# Patient Record
Sex: Female | Born: 1980 | Race: Black or African American | Hispanic: No | Marital: Single | Smoking: Current every day smoker
Health system: Southern US, Community
[De-identification: ages and names within clinical notes are randomized; demographics above are authoritative.]

## PROBLEM LIST (undated history)

## (undated) ENCOUNTER — Emergency Department: Admission: EM | Payer: Medicaid Other

## (undated) DIAGNOSIS — F319 Bipolar disorder, unspecified: Secondary | ICD-10-CM

## (undated) DIAGNOSIS — I10 Essential (primary) hypertension: Secondary | ICD-10-CM

## (undated) DIAGNOSIS — F329 Major depressive disorder, single episode, unspecified: Secondary | ICD-10-CM

## (undated) DIAGNOSIS — F79 Unspecified intellectual disabilities: Secondary | ICD-10-CM

## (undated) DIAGNOSIS — E213 Hyperparathyroidism, unspecified: Secondary | ICD-10-CM

## (undated) DIAGNOSIS — N390 Urinary tract infection, site not specified: Secondary | ICD-10-CM

## (undated) DIAGNOSIS — E119 Type 2 diabetes mellitus without complications: Secondary | ICD-10-CM

## (undated) DIAGNOSIS — N189 Chronic kidney disease, unspecified: Secondary | ICD-10-CM

## (undated) DIAGNOSIS — R4189 Other symptoms and signs involving cognitive functions and awareness: Secondary | ICD-10-CM

## (undated) DIAGNOSIS — F32A Depression, unspecified: Secondary | ICD-10-CM

## (undated) DIAGNOSIS — A4901 Methicillin susceptible Staphylococcus aureus infection, unspecified site: Secondary | ICD-10-CM

## (undated) DIAGNOSIS — D649 Anemia, unspecified: Secondary | ICD-10-CM

## (undated) DIAGNOSIS — E785 Hyperlipidemia, unspecified: Secondary | ICD-10-CM

## (undated) DIAGNOSIS — F1010 Alcohol abuse, uncomplicated: Secondary | ICD-10-CM

## (undated) HISTORY — PX: CHOLECYSTECTOMY: SHX55

## (undated) HISTORY — PX: AV FISTULA PLACEMENT: SHX1204

## (undated) HISTORY — PX: OTHER SURGICAL HISTORY: SHX169

---

## 2011-12-13 ENCOUNTER — Ambulatory Visit: Payer: Self-pay | Admitting: Vascular Surgery

## 2011-12-26 ENCOUNTER — Emergency Department: Payer: Self-pay

## 2011-12-27 ENCOUNTER — Ambulatory Visit: Payer: Self-pay | Admitting: Vascular Surgery

## 2011-12-27 DIAGNOSIS — I10 Essential (primary) hypertension: Secondary | ICD-10-CM

## 2011-12-27 LAB — BASIC METABOLIC PANEL
BUN: 22 mg/dL — ABNORMAL HIGH (ref 7–18)
Chloride: 100 mmol/L (ref 98–107)
Creatinine: 8.93 mg/dL — ABNORMAL HIGH (ref 0.60–1.30)
EGFR (African American): 7 — ABNORMAL LOW
EGFR (Non-African Amer.): 6 — ABNORMAL LOW
Glucose: 71 mg/dL (ref 65–99)
Osmolality: 276 (ref 275–301)
Potassium: 4.1 mmol/L (ref 3.5–5.1)
Sodium: 137 mmol/L (ref 136–145)

## 2011-12-27 LAB — CBC
HGB: 9.3 g/dL — ABNORMAL LOW (ref 12.0–16.0)
MCH: 31.9 pg (ref 26.0–34.0)
Platelet: 183 10*3/uL (ref 150–440)
RBC: 2.93 10*6/uL — ABNORMAL LOW (ref 3.80–5.20)
WBC: 7.7 10*3/uL (ref 3.6–11.0)

## 2012-01-02 ENCOUNTER — Emergency Department: Payer: Self-pay | Admitting: Emergency Medicine

## 2012-01-02 LAB — DRUG SCREEN, URINE

## 2012-01-02 LAB — VALPROIC ACID LEVEL: Valproic Acid: 4 ug/mL — ABNORMAL LOW

## 2012-01-02 LAB — COMPREHENSIVE METABOLIC PANEL
Alkaline Phosphatase: 218 U/L — ABNORMAL HIGH (ref 50–136)
BUN: 30 mg/dL — ABNORMAL HIGH (ref 7–18)
Chloride: 99 mmol/L (ref 98–107)
Creatinine: 11.91 mg/dL — ABNORMAL HIGH (ref 0.60–1.30)
EGFR (African American): 5 — ABNORMAL LOW
Glucose: 128 mg/dL — ABNORMAL HIGH (ref 65–99)
SGOT(AST): 45 U/L — ABNORMAL HIGH (ref 15–37)
SGPT (ALT): 48 U/L
Total Protein: 7.7 g/dL (ref 6.4–8.2)

## 2012-01-02 LAB — URINALYSIS, COMPLETE
Bilirubin,UR: NEGATIVE
Glucose,UR: 50 mg/dL (ref 0–75)
Ketone: NEGATIVE
Nitrite: NEGATIVE
Protein: 100
RBC,UR: <1 /HPF (ref 0–5)
Specific Gravity: 1.014 (ref 1.003–1.030)
WBC UR: 9 /HPF (ref 0–5)

## 2012-01-02 LAB — CBC
HCT: 28.7 % — ABNORMAL LOW (ref 35.0–47.0)
MCHC: 32.7 g/dL (ref 32.0–36.0)
Platelet: 284 10*3/uL (ref 150–440)
RBC: 2.93 10*6/uL — ABNORMAL LOW (ref 3.80–5.20)
RDW: 21.9 % — ABNORMAL HIGH (ref 11.5–14.5)
WBC: 8.4 10*3/uL (ref 3.6–11.0)

## 2012-01-02 LAB — PREGNANCY, URINE: Pregnancy Test, Urine: NEGATIVE m[IU]/mL

## 2012-01-02 LAB — ETHANOL
Ethanol %: <0.003 % (ref 0.000–0.080)
Ethanol: <3 mg/dL

## 2012-01-02 LAB — ACETAMINOPHEN LEVEL: Acetaminophen: <2 ug/mL

## 2012-01-03 LAB — BASIC METABOLIC PANEL
Anion Gap: 15 (ref 7–16)
Calcium, Total: 9.4 mg/dL (ref 8.5–10.1)
Creatinine: 14.13 mg/dL — ABNORMAL HIGH (ref 0.60–1.30)
EGFR (African American): 4 — ABNORMAL LOW
EGFR (Non-African Amer.): 3 — ABNORMAL LOW
Osmolality: 286 (ref 275–301)
Potassium: 4.8 mmol/L (ref 3.5–5.1)
Sodium: 138 mmol/L (ref 136–145)

## 2012-01-03 LAB — PHOSPHORUS: Phosphorus: 2.7 mg/dL (ref 2.5–4.9)

## 2012-01-04 LAB — CBC WITH DIFFERENTIAL/PLATELET
Basophil #: 0 10*3/uL (ref 0.0–0.1)
Basophil %: 0.6 %
HCT: 27.5 % — ABNORMAL LOW (ref 35.0–47.0)
HGB: 9 g/dL — ABNORMAL LOW (ref 12.0–16.0)
Lymphocyte #: 1.5 10*3/uL (ref 1.0–3.6)
Lymphocyte %: 25.8 %
MCV: 98 fL (ref 80–100)
Monocyte %: 5.7 %
Neutrophil #: 3.7 10*3/uL (ref 1.4–6.5)
Neutrophil %: 62.1 %
RBC: 2.83 10*6/uL — ABNORMAL LOW (ref 3.80–5.20)
RDW: 19.7 % — ABNORMAL HIGH (ref 11.5–14.5)
WBC: 5.8 10*3/uL (ref 3.6–11.0)

## 2012-01-04 LAB — RENAL FUNCTION PANEL
Albumin: 3.4 g/dL (ref 3.4–5.0)
Anion Gap: 13 (ref 7–16)
Calcium, Total: 9.7 mg/dL (ref 8.5–10.1)
Co2: 30 mmol/L (ref 21–32)
Creatinine: 16.59 mg/dL — ABNORMAL HIGH (ref 0.60–1.30)
EGFR (African American): 3 — ABNORMAL LOW
Glucose: 161 mg/dL — ABNORMAL HIGH (ref 65–99)
Phosphorus: 3.1 mg/dL (ref 2.5–4.9)
Potassium: 4.4 mmol/L (ref 3.5–5.1)
Sodium: 138 mmol/L (ref 136–145)

## 2012-01-04 LAB — BASIC METABOLIC PANEL
Anion Gap: 12 (ref 7–16)
BUN: 41 mg/dL — ABNORMAL HIGH (ref 7–18)
Calcium, Total: 9.3 mg/dL (ref 8.5–10.1)
Chloride: 95 mmol/L — ABNORMAL LOW (ref 98–107)
Co2: 31 mmol/L (ref 21–32)
Creatinine: 16.28 mg/dL — ABNORMAL HIGH (ref 0.60–1.30)
EGFR (African American): 3 — ABNORMAL LOW
EGFR (Non-African Amer.): 3 — ABNORMAL LOW
Potassium: 4.4 mmol/L (ref 3.5–5.1)
Sodium: 138 mmol/L (ref 136–145)

## 2012-01-05 LAB — BASIC METABOLIC PANEL
Anion Gap: 8 (ref 7–16)
Chloride: 99 mmol/L (ref 98–107)
Co2: 29 mmol/L (ref 21–32)
EGFR (African American): 6 — ABNORMAL LOW
EGFR (Non-African Amer.): 5 — ABNORMAL LOW
Glucose: 65 mg/dL (ref 65–99)
Potassium: 4.7 mmol/L (ref 3.5–5.1)
Sodium: 136 mmol/L (ref 136–145)

## 2012-01-08 LAB — COMPREHENSIVE METABOLIC PANEL
Alkaline Phosphatase: 210 U/L — ABNORMAL HIGH (ref 50–136)
BUN: 44 mg/dL — ABNORMAL HIGH (ref 7–18)
Bilirubin,Total: 0.3 mg/dL (ref 0.2–1.0)
Calcium, Total: 9.3 mg/dL (ref 8.5–10.1)
Chloride: 97 mmol/L — ABNORMAL LOW (ref 98–107)
Creatinine: 11.35 mg/dL — ABNORMAL HIGH (ref 0.60–1.30)
EGFR (African American): 5 — ABNORMAL LOW
SGPT (ALT): 28 U/L
Sodium: 137 mmol/L (ref 136–145)
Total Protein: 7.7 g/dL (ref 6.4–8.2)

## 2012-01-09 LAB — CBC WITH DIFFERENTIAL/PLATELET
Basophil #: 0.1 10*3/uL (ref 0.0–0.1)
Eosinophil %: 5.3 %
Lymphocyte #: 1.4 10*3/uL (ref 1.0–3.6)
Lymphocyte %: 41.3 %
MCH: 32 pg (ref 26.0–34.0)
MCHC: 32.7 g/dL (ref 32.0–36.0)
MCV: 98 fL (ref 80–100)
Monocyte #: 0.1 10*3/uL (ref 0.0–0.7)
Platelet: 232 10*3/uL (ref 150–440)
RBC: 2.67 10*6/uL — ABNORMAL LOW (ref 3.80–5.20)
RDW: 20.7 % — ABNORMAL HIGH (ref 11.5–14.5)

## 2012-01-09 LAB — PHOSPHORUS: Phosphorus: 1.6 mg/dL — ABNORMAL LOW (ref 2.5–4.9)

## 2012-01-11 LAB — CBC WITH DIFFERENTIAL/PLATELET
Basophil #: 0 10*3/uL (ref 0.0–0.1)
Eosinophil #: 0.2 10*3/uL (ref 0.0–0.7)
Lymphocyte #: 1.5 10*3/uL (ref 1.0–3.6)
MCH: 31.6 pg (ref 26.0–34.0)
MCHC: 32.1 g/dL (ref 32.0–36.0)
MCV: 98 fL (ref 80–100)
Monocyte #: 0.4 10*3/uL (ref 0.0–0.7)
Monocyte %: 8.6 %
Neutrophil #: 2.7 10*3/uL (ref 1.4–6.5)
Neutrophil %: 56.5 %
Platelet: 238 10*3/uL (ref 150–440)
RDW: 20.9 % — ABNORMAL HIGH (ref 11.5–14.5)
WBC: 4.9 10*3/uL (ref 3.6–11.0)

## 2012-01-11 LAB — RENAL FUNCTION PANEL
Albumin: 3.2 g/dL — ABNORMAL LOW (ref 3.4–5.0)
Anion Gap: 11 (ref 7–16)
BUN: 56 mg/dL — ABNORMAL HIGH (ref 7–18)
Chloride: 94 mmol/L — ABNORMAL LOW (ref 98–107)
Co2: 30 mmol/L (ref 21–32)
Creatinine: 12.71 mg/dL — ABNORMAL HIGH (ref 0.60–1.30)
EGFR (African American): 4 — ABNORMAL LOW
EGFR (Non-African Amer.): 4 — ABNORMAL LOW
Glucose: 148 mg/dL — ABNORMAL HIGH (ref 65–99)
Osmolality: 288 (ref 275–301)
Potassium: 5.7 mmol/L — ABNORMAL HIGH (ref 3.5–5.1)
Sodium: 135 mmol/L — ABNORMAL LOW (ref 136–145)

## 2012-01-14 LAB — RENAL FUNCTION PANEL
BUN: 46 mg/dL — ABNORMAL HIGH (ref 7–18)
Chloride: 94 mmol/L — ABNORMAL LOW (ref 98–107)
Creatinine: 12.76 mg/dL — ABNORMAL HIGH (ref 0.60–1.30)
EGFR (African American): 4 — ABNORMAL LOW
Glucose: 193 mg/dL — ABNORMAL HIGH (ref 65–99)
Phosphorus: 2.4 mg/dL — ABNORMAL LOW (ref 2.5–4.9)
Potassium: 5.6 mmol/L — ABNORMAL HIGH (ref 3.5–5.1)
Sodium: 134 mmol/L — ABNORMAL LOW (ref 136–145)

## 2012-01-14 LAB — CBC WITH DIFFERENTIAL/PLATELET
Basophil #: 0 x10 3/mm 3
Basophil %: 0.5 %
Eosinophil #: 0.2 x10 3/mm 3
Eosinophil %: 3.5 %
HCT: 26.8 % — ABNORMAL LOW
HGB: 8.7 g/dL — ABNORMAL LOW
Lymphocyte %: 24.3 %
Lymphs Abs: 1.4 x10 3/mm 3
MCH: 32.1 pg
MCHC: 32.3 g/dL
MCV: 99 fL
Monocyte #: 0.2 x10 3/mm 3
Monocyte %: 4.4 %
Neutrophil #: 3.8 x10 3/mm 3
Neutrophil %: 67.3 %
Platelet: 260 x10 3/mm 3
RBC: 2.7 X10 6/mm 3 — ABNORMAL LOW
RDW: 20.9 % — ABNORMAL HIGH
WBC: 5.6 x10 3/mm 3

## 2012-03-04 ENCOUNTER — Inpatient Hospital Stay: Payer: Self-pay | Admitting: Student

## 2012-03-04 LAB — CBC
HCT: 35.7 % (ref 35.0–47.0)
MCH: 32.8 pg (ref 26.0–34.0)
MCHC: 32.8 g/dL (ref 32.0–36.0)
Platelet: 138 10*3/uL — ABNORMAL LOW (ref 150–440)
RBC: 3.57 10*6/uL — ABNORMAL LOW (ref 3.80–5.20)
WBC: 9.3 10*3/uL (ref 3.6–11.0)

## 2012-03-04 LAB — COMPREHENSIVE METABOLIC PANEL
Alkaline Phosphatase: 199 U/L — ABNORMAL HIGH (ref 50–136)
Anion Gap: 11 (ref 7–16)
Bilirubin,Total: 0.3 mg/dL (ref 0.2–1.0)
Chloride: 99 mmol/L (ref 98–107)
Co2: 26 mmol/L (ref 21–32)
EGFR (African American): 9 — ABNORMAL LOW
Glucose: 310 mg/dL — ABNORMAL HIGH (ref 65–99)
Potassium: 4.6 mmol/L (ref 3.5–5.1)
SGPT (ALT): 17 U/L

## 2012-03-04 LAB — DIFFERENTIAL
Basophil #: 0 10*3/uL (ref 0.0–0.1)
Eosinophil #: 0 10*3/uL (ref 0.0–0.7)
Lymphocyte #: 0.2 10*3/uL — ABNORMAL LOW (ref 1.0–3.6)

## 2012-03-05 LAB — CK TOTAL AND CKMB (NOT AT ARMC)
CK, Total: 55 U/L (ref 21–215)
CK, Total: 60 U/L (ref 21–215)
CK-MB: 0.6 ng/mL (ref 0.5–3.6)

## 2012-03-05 LAB — URINALYSIS, COMPLETE
Blood: NEGATIVE
Glucose,UR: >=500 mg/dL (ref 0–75)
Ketone: NEGATIVE
Nitrite: NEGATIVE
Ph: 7 (ref 4.5–8.0)
Protein: 100
WBC UR: 11 /HPF (ref 0–5)

## 2012-03-05 LAB — TROPONIN I: Troponin-I: <0.02 ng/mL

## 2012-03-06 LAB — URINE CULTURE

## 2012-03-07 LAB — CBC WITH DIFFERENTIAL/PLATELET
Basophil %: 0.4 %
Eosinophil %: 5.5 %
HGB: 10.5 g/dL — ABNORMAL LOW (ref 12.0–16.0)
Lymphocyte #: 1.9 10*3/uL (ref 1.0–3.6)
Lymphocyte %: 34.9 %
MCHC: 31.8 g/dL — ABNORMAL LOW (ref 32.0–36.0)
MCV: 100 fL (ref 80–100)
Monocyte %: 7.2 %
Neutrophil #: 2.8 10*3/uL (ref 1.4–6.5)
Platelet: 169 10*3/uL (ref 150–440)
RDW: 15 % — ABNORMAL HIGH (ref 11.5–14.5)

## 2012-03-07 LAB — BASIC METABOLIC PANEL
Calcium, Total: 8.8 mg/dL (ref 8.5–10.1)
Chloride: 101 mmol/L (ref 98–107)
Co2: 21 mmol/L (ref 21–32)
EGFR (African American): 4 — ABNORMAL LOW
EGFR (Non-African Amer.): 4 — ABNORMAL LOW
Osmolality: 295 (ref 275–301)
Sodium: 135 mmol/L — ABNORMAL LOW (ref 136–145)

## 2012-03-07 LAB — VALPROIC ACID LEVEL: Valproic Acid: 32 ug/mL — ABNORMAL LOW

## 2012-03-07 LAB — VANCOMYCIN, RANDOM: Vancomycin, Random: 33 ug/mL

## 2012-03-08 LAB — CBC WITH DIFFERENTIAL/PLATELET
Eosinophil #: 0.3 10*3/uL (ref 0.0–0.7)
Eosinophil %: 4.8 %
HCT: 32 % — ABNORMAL LOW (ref 35.0–47.0)
HGB: 10.5 g/dL — ABNORMAL LOW (ref 12.0–16.0)
MCH: 32.7 pg (ref 26.0–34.0)
MCHC: 32.9 g/dL (ref 32.0–36.0)
Neutrophil #: 2.2 10*3/uL (ref 1.4–6.5)
Neutrophil %: 39.8 %
RDW: 15.6 % — ABNORMAL HIGH (ref 11.5–14.5)
WBC: 5.4 10*3/uL (ref 3.6–11.0)

## 2012-03-08 LAB — RENAL FUNCTION PANEL
Albumin: 2.8 g/dL — ABNORMAL LOW (ref 3.4–5.0)
BUN: 49 mg/dL — ABNORMAL HIGH (ref 7–18)
Calcium, Total: 8.8 mg/dL (ref 8.5–10.1)
Creatinine: 9.35 mg/dL — ABNORMAL HIGH (ref 0.60–1.30)
EGFR (African American): 6 — ABNORMAL LOW
Glucose: 72 mg/dL (ref 65–99)
Osmolality: 287 (ref 275–301)
Phosphorus: 3.7 mg/dL (ref 2.5–4.9)
Potassium: 5.2 mmol/L — ABNORMAL HIGH (ref 3.5–5.1)

## 2012-03-08 LAB — AEROBIC CULTURE

## 2012-03-10 LAB — CBC WITH DIFFERENTIAL/PLATELET
Basophil %: 0.4 %
Eosinophil %: 4.4 %
HCT: 32.5 % — ABNORMAL LOW (ref 35.0–47.0)
HGB: 10.3 g/dL — ABNORMAL LOW (ref 12.0–16.0)
Lymphocyte %: 26.7 %
MCHC: 31.7 g/dL — ABNORMAL LOW (ref 32.0–36.0)
MCV: 100 fL (ref 80–100)
Monocyte #: 0.3 x10 3/mm (ref 0.2–0.9)
Neutrophil #: 4.1 10*3/uL (ref 1.4–6.5)
Platelet: 179 10*3/uL (ref 150–440)
RBC: 3.24 10*6/uL — ABNORMAL LOW (ref 3.80–5.20)

## 2012-03-10 LAB — RENAL FUNCTION PANEL
Anion Gap: 11 (ref 7–16)
BUN: 64 mg/dL — ABNORMAL HIGH (ref 7–18)
Calcium, Total: 8.6 mg/dL (ref 8.5–10.1)
Chloride: 100 mmol/L (ref 98–107)
Co2: 27 mmol/L (ref 21–32)
EGFR (African American): 5 — ABNORMAL LOW
EGFR (Non-African Amer.): 4 — ABNORMAL LOW
Potassium: 6.5 mmol/L (ref 3.5–5.1)
Sodium: 138 mmol/L (ref 136–145)

## 2012-03-10 LAB — CULTURE, BLOOD (SINGLE)

## 2012-03-12 LAB — RENAL FUNCTION PANEL
BUN: 53 mg/dL — ABNORMAL HIGH (ref 7–18)
Chloride: 99 mmol/L (ref 98–107)
Co2: 28 mmol/L (ref 21–32)
Creatinine: 9.83 mg/dL — ABNORMAL HIGH (ref 0.60–1.30)
EGFR (African American): 6 — ABNORMAL LOW
EGFR (Non-African Amer.): 5 — ABNORMAL LOW
Glucose: 215 mg/dL — ABNORMAL HIGH (ref 65–99)
Phosphorus: 3.7 mg/dL (ref 2.5–4.9)
Potassium: 8 mmol/L (ref 3.5–5.1)

## 2012-03-12 LAB — VALPROIC ACID LEVEL: Valproic Acid: 71 ug/mL

## 2012-03-13 LAB — RENAL FUNCTION PANEL
Albumin: 2.8 g/dL — ABNORMAL LOW (ref 3.4–5.0)
Co2: 33 mmol/L — ABNORMAL HIGH (ref 21–32)
Creatinine: 8.55 mg/dL — ABNORMAL HIGH (ref 0.60–1.30)
EGFR (Non-African Amer.): 6 — ABNORMAL LOW
Glucose: 41 mg/dL — ABNORMAL LOW (ref 65–99)
Osmolality: 289 (ref 275–301)
Phosphorus: 4.4 mg/dL (ref 2.5–4.9)
Potassium: 5.4 mmol/L — ABNORMAL HIGH (ref 3.5–5.1)
Sodium: 142 mmol/L (ref 136–145)

## 2012-03-13 LAB — CULTURE, BLOOD (SINGLE)

## 2012-03-14 LAB — BASIC METABOLIC PANEL
Anion Gap: 10 (ref 7–16)
Calcium, Total: 8.9 mg/dL (ref 8.5–10.1)
Chloride: 99 mmol/L (ref 98–107)
EGFR (African American): 8 — ABNORMAL LOW
Osmolality: 281 (ref 275–301)
Potassium: 4.8 mmol/L (ref 3.5–5.1)
Sodium: 140 mmol/L (ref 136–145)

## 2012-03-14 LAB — HEMOGLOBIN: HGB: 10.6 g/dL — ABNORMAL LOW (ref 12.0–16.0)

## 2012-04-22 ENCOUNTER — Ambulatory Visit: Payer: Self-pay | Admitting: Vascular Surgery

## 2012-04-25 ENCOUNTER — Emergency Department: Payer: Self-pay | Admitting: Emergency Medicine

## 2012-04-25 LAB — COMPREHENSIVE METABOLIC PANEL
Alkaline Phosphatase: 200 U/L — ABNORMAL HIGH (ref 50–136)
BUN: 16 mg/dL (ref 7–18)
Bilirubin,Total: 0.2 mg/dL (ref 0.2–1.0)
Calcium, Total: 8.5 mg/dL (ref 8.5–10.1)
Chloride: 99 mmol/L (ref 98–107)
Co2: 25 mmol/L (ref 21–32)
Creatinine: 5.08 mg/dL — ABNORMAL HIGH (ref 0.60–1.30)
EGFR (African American): 12 — ABNORMAL LOW
EGFR (Non-African Amer.): 11 — ABNORMAL LOW
Glucose: 382 mg/dL — ABNORMAL HIGH (ref 65–99)
Potassium: 3.7 mmol/L (ref 3.5–5.1)
SGOT(AST): 18 U/L (ref 15–37)
SGPT (ALT): 10 U/L — ABNORMAL LOW
Total Protein: 7.6 g/dL (ref 6.4–8.2)

## 2012-04-25 LAB — CBC
HCT: 34.4 % — ABNORMAL LOW (ref 35.0–47.0)
MCH: 31.9 pg (ref 26.0–34.0)
MCV: 100 fL (ref 80–100)
WBC: 5.8 10*3/uL (ref 3.6–11.0)

## 2012-05-01 LAB — CULTURE, BLOOD (SINGLE)

## 2012-05-06 ENCOUNTER — Ambulatory Visit: Payer: Self-pay | Admitting: Vascular Surgery

## 2012-05-07 ENCOUNTER — Emergency Department: Payer: Self-pay | Admitting: Emergency Medicine

## 2012-05-07 LAB — CBC WITH DIFFERENTIAL/PLATELET
Basophil %: 0.4 %
Eosinophil #: 0.2 10*3/uL (ref 0.0–0.7)
Eosinophil %: 2.6 %
HCT: 34.7 % — ABNORMAL LOW (ref 35.0–47.0)
HGB: 11 g/dL — ABNORMAL LOW (ref 12.0–16.0)
Lymphocyte #: 1.5 10*3/uL (ref 1.0–3.6)
Lymphocyte %: 21.9 %
MCH: 30.9 pg (ref 26.0–34.0)
MCHC: 31.8 g/dL — ABNORMAL LOW (ref 32.0–36.0)
Monocyte %: 8.8 %
Neutrophil #: 4.7 10*3/uL (ref 1.4–6.5)
Neutrophil %: 66.3 %
Platelet: 209 10*3/uL (ref 150–440)
RBC: 3.57 10*6/uL — ABNORMAL LOW (ref 3.80–5.20)
WBC: 7 10*3/uL (ref 3.6–11.0)

## 2012-05-07 LAB — COMPREHENSIVE METABOLIC PANEL
Albumin: 3.2 g/dL — ABNORMAL LOW (ref 3.4–5.0)
Alkaline Phosphatase: 225 U/L — ABNORMAL HIGH (ref 50–136)
Anion Gap: 8 (ref 7–16)
BUN: 16 mg/dL (ref 7–18)
Bilirubin,Total: 0.2 mg/dL (ref 0.2–1.0)
Co2: 28 mmol/L (ref 21–32)
Creatinine: 5.04 mg/dL — ABNORMAL HIGH (ref 0.60–1.30)
EGFR (Non-African Amer.): 11 — ABNORMAL LOW
Glucose: 192 mg/dL — ABNORMAL HIGH (ref 65–99)
Osmolality: 280 (ref 275–301)
SGPT (ALT): 13 U/L
Total Protein: 7.4 g/dL (ref 6.4–8.2)

## 2012-05-07 LAB — TROPONIN I: Troponin-I: <0.02 ng/mL

## 2012-05-07 LAB — TSH: Thyroid Stimulating Horm: 2.95 u[IU]/mL

## 2012-05-08 LAB — TROPONIN I: Troponin-I: <0.02 ng/mL

## 2012-05-14 LAB — CULTURE, BLOOD (SINGLE)

## 2012-06-03 ENCOUNTER — Ambulatory Visit: Payer: Self-pay | Admitting: Vascular Surgery

## 2012-07-15 ENCOUNTER — Ambulatory Visit: Payer: Self-pay | Admitting: Vascular Surgery

## 2012-07-15 LAB — POTASSIUM: Potassium: 4.3 mmol/L (ref 3.5–5.1)

## 2012-07-15 LAB — HCG, QUANTITATIVE, PREGNANCY: Beta Hcg, Quant.: 1 m[IU]/mL

## 2012-10-06 ENCOUNTER — Inpatient Hospital Stay: Payer: Self-pay | Admitting: Internal Medicine

## 2012-10-06 LAB — COMPREHENSIVE METABOLIC PANEL
Albumin: 3.5 g/dL (ref 3.4–5.0)
Alkaline Phosphatase: 176 U/L — ABNORMAL HIGH (ref 50–136)
Anion Gap: 13 (ref 7–16)
BUN: 62 mg/dL — ABNORMAL HIGH (ref 7–18)
Calcium, Total: 9 mg/dL (ref 8.5–10.1)
Co2: 24 mmol/L (ref 21–32)
Glucose: 188 mg/dL — ABNORMAL HIGH (ref 65–99)
Potassium: 4.6 mmol/L (ref 3.5–5.1)
SGOT(AST): 21 U/L (ref 15–37)
SGPT (ALT): 9 U/L — ABNORMAL LOW (ref 12–78)
Total Protein: 7.8 g/dL (ref 6.4–8.2)

## 2012-10-06 LAB — CK TOTAL AND CKMB (NOT AT ARMC)
CK, Total: 80 U/L
CK-MB: 0.7 ng/mL

## 2012-10-06 LAB — CBC
HCT: 44.6 %
HGB: 14.7 g/dL
MCH: 30.9 pg
MCHC: 33 g/dL
MCV: 94 fL
Platelet: 134 10*3/uL — ABNORMAL LOW
RBC: 4.77 X10 6/mm 3
RDW: 18.2 % — ABNORMAL HIGH
WBC: 8.7 10*3/uL

## 2012-10-06 LAB — PHOSPHORUS: Phosphorus: 4.2 mg/dL

## 2012-10-06 LAB — LIPASE, BLOOD: Lipase: 123 U/L

## 2012-10-06 LAB — VALPROIC ACID LEVEL: Valproic Acid: 48 ug/mL — ABNORMAL LOW

## 2012-10-06 LAB — HCG, QUANTITATIVE, PREGNANCY: Beta Hcg, Quant.: <1 m[IU]/mL — ABNORMAL LOW

## 2012-10-07 LAB — CBC WITH DIFFERENTIAL/PLATELET
Eosinophil %: 0.5 %
HCT: 34.2 % — ABNORMAL LOW (ref 35.0–47.0)
Lymphocyte #: 1.9 10*3/uL (ref 1.0–3.6)
MCV: 94 fL (ref 80–100)
Monocyte %: 7 %
Neutrophil %: 65.2 %
Platelet: 100 10*3/uL — ABNORMAL LOW (ref 150–440)
RBC: 3.64 10*6/uL — ABNORMAL LOW (ref 3.80–5.20)
RDW: 18.3 % — ABNORMAL HIGH (ref 11.5–14.5)
WBC: 7 10*3/uL (ref 3.6–11.0)

## 2012-10-07 LAB — HEMOGLOBIN A1C: Hemoglobin A1C: 5.7 % (ref 4.2–6.3)

## 2012-10-07 LAB — BASIC METABOLIC PANEL
Anion Gap: 14 (ref 7–16)
Chloride: 101 mmol/L (ref 98–107)
Creatinine: 12.66 mg/dL — ABNORMAL HIGH (ref 0.60–1.30)
EGFR (African American): 4 — ABNORMAL LOW
EGFR (Non-African Amer.): 4 — ABNORMAL LOW
Osmolality: 312 (ref 275–301)
Potassium: 4.6 mmol/L (ref 3.5–5.1)
Sodium: 138 mmol/L (ref 136–145)

## 2012-10-07 LAB — PHOSPHORUS: Phosphorus: 4.5 mg/dL (ref 2.5–4.9)

## 2012-10-07 LAB — MAGNESIUM: Magnesium: 2.5 mg/dL — ABNORMAL HIGH

## 2012-10-09 LAB — PHOSPHORUS: Phosphorus: 3.7 mg/dL (ref 2.5–4.9)

## 2012-10-14 LAB — CULTURE, BLOOD (SINGLE)

## 2012-11-04 ENCOUNTER — Ambulatory Visit: Payer: Self-pay | Admitting: Vascular Surgery

## 2012-11-04 LAB — CBC
HCT: 36.3 % (ref 35.0–47.0)
HGB: 12 g/dL (ref 12.0–16.0)
MCH: 31.9 pg (ref 26.0–34.0)
MCV: 96 fL (ref 80–100)
Platelet: 190 10*3/uL (ref 150–440)
RBC: 3.77 10*6/uL — ABNORMAL LOW (ref 3.80–5.20)
WBC: 6.8 10*3/uL (ref 3.6–11.0)

## 2012-11-04 LAB — BASIC METABOLIC PANEL
BUN: 27 mg/dL — ABNORMAL HIGH (ref 7–18)
Creatinine: 7.08 mg/dL — ABNORMAL HIGH (ref 0.60–1.30)
EGFR (African American): 8 — ABNORMAL LOW
EGFR (Non-African Amer.): 7 — ABNORMAL LOW
Glucose: 145 mg/dL — ABNORMAL HIGH (ref 65–99)
Osmolality: 282 (ref 275–301)
Sodium: 137 mmol/L (ref 136–145)

## 2012-11-12 ENCOUNTER — Ambulatory Visit: Payer: Self-pay | Admitting: Vascular Surgery

## 2012-11-12 LAB — POTASSIUM: Potassium: 4.6 mmol/L (ref 3.5–5.1)

## 2012-12-23 ENCOUNTER — Ambulatory Visit: Payer: Self-pay | Admitting: Vascular Surgery

## 2012-12-25 ENCOUNTER — Emergency Department: Payer: Self-pay | Admitting: Emergency Medicine

## 2012-12-25 LAB — COMPREHENSIVE METABOLIC PANEL
Albumin: 3.4 g/dL (ref 3.4–5.0)
Chloride: 99 mmol/L (ref 98–107)
Co2: 27 mmol/L (ref 21–32)
Creatinine: 8.19 mg/dL — ABNORMAL HIGH (ref 0.60–1.30)
EGFR (African American): 7 — ABNORMAL LOW
EGFR (Non-African Amer.): 6 — ABNORMAL LOW
Potassium: 5.3 mmol/L — ABNORMAL HIGH (ref 3.5–5.1)
SGOT(AST): 30 U/L (ref 15–37)
Sodium: 135 mmol/L — ABNORMAL LOW (ref 136–145)

## 2012-12-25 LAB — HCG, QUANTITATIVE, PREGNANCY: Beta Hcg, Quant.: 2 m[IU]/mL

## 2012-12-25 LAB — URINALYSIS, COMPLETE
Bacteria: NONE SEEN
Bilirubin,UR: NEGATIVE
Glucose,UR: >=500 mg/dL (ref 0–75)
Hyaline Cast: 5
Ketone: NEGATIVE
Leukocyte Esterase: NEGATIVE
Nitrite: NEGATIVE
RBC,UR: 14 /HPF (ref 0–5)
Specific Gravity: 1.018 (ref 1.003–1.030)
Squamous Epithelial: <1
WBC UR: 5 /HPF (ref 0–5)

## 2012-12-25 LAB — RAPID INFLUENZA A&B ANTIGENS

## 2012-12-25 LAB — CBC
MCHC: 33 g/dL (ref 32.0–36.0)
MCV: 98 fL (ref 80–100)
Platelet: 111 10*3/uL — ABNORMAL LOW (ref 150–440)
RBC: 3.4 10*6/uL — ABNORMAL LOW (ref 3.80–5.20)
WBC: 8.3 10*3/uL (ref 3.6–11.0)

## 2012-12-26 ENCOUNTER — Inpatient Hospital Stay: Payer: Self-pay | Admitting: Internal Medicine

## 2012-12-26 LAB — COMPREHENSIVE METABOLIC PANEL
Albumin: 3.4 g/dL (ref 3.4–5.0)
Anion Gap: 8 (ref 7–16)
Calcium, Total: 9.1 mg/dL (ref 8.5–10.1)
Chloride: 99 mmol/L (ref 98–107)
Creatinine: 11.1 mg/dL — ABNORMAL HIGH (ref 0.60–1.30)
EGFR (African American): 5 — ABNORMAL LOW
EGFR (Non-African Amer.): 4 — ABNORMAL LOW
Glucose: 248 mg/dL — ABNORMAL HIGH (ref 65–99)
Potassium: 6.1 mmol/L — ABNORMAL HIGH (ref 3.5–5.1)
SGOT(AST): 87 U/L — ABNORMAL HIGH (ref 15–37)

## 2012-12-26 LAB — MAGNESIUM: Magnesium: 2.1 mg/dL

## 2012-12-26 LAB — CBC
HCT: 30.9 % — ABNORMAL LOW (ref 35.0–47.0)
HGB: 10.2 g/dL — ABNORMAL LOW (ref 12.0–16.0)
MCHC: 33 g/dL (ref 32.0–36.0)
MCV: 98 fL (ref 80–100)

## 2012-12-26 LAB — PHOSPHORUS: Phosphorus: 3.2 mg/dL (ref 2.5–4.9)

## 2012-12-27 LAB — CBC WITH DIFFERENTIAL/PLATELET
Basophil #: 0.1 10*3/uL (ref 0.0–0.1)
Basophil %: 0.8 %
Eosinophil #: 0.2 10*3/uL (ref 0.0–0.7)
Eosinophil %: 2.7 %
HCT: 31.2 % — ABNORMAL LOW (ref 35.0–47.0)
HGB: 10.3 g/dL — ABNORMAL LOW (ref 12.0–16.0)
Lymphocyte %: 28.6 %
Monocyte #: 0.7 x10 3/mm (ref 0.2–0.9)
Neutrophil #: 5.4 10*3/uL (ref 1.4–6.5)
RBC: 3.18 10*6/uL — ABNORMAL LOW (ref 3.80–5.20)

## 2012-12-27 LAB — BASIC METABOLIC PANEL
Calcium, Total: 8.9 mg/dL (ref 8.5–10.1)
Creatinine: 7.99 mg/dL — ABNORMAL HIGH (ref 0.60–1.30)
EGFR (Non-African Amer.): 6 — ABNORMAL LOW
Potassium: 4.8 mmol/L (ref 3.5–5.1)

## 2012-12-28 LAB — CULTURE, BLOOD (SINGLE)

## 2012-12-29 LAB — VANCOMYCIN, TROUGH: Vancomycin, Trough: 17 ug/mL (ref 10–20)

## 2012-12-30 DIAGNOSIS — I059 Rheumatic mitral valve disease, unspecified: Secondary | ICD-10-CM

## 2012-12-31 LAB — PHOSPHORUS: Phosphorus: 2.8 mg/dL (ref 2.5–4.9)

## 2013-01-04 LAB — CULTURE, BLOOD (SINGLE)

## 2013-03-02 ENCOUNTER — Emergency Department: Payer: Self-pay | Admitting: Emergency Medicine

## 2013-04-02 ENCOUNTER — Ambulatory Visit: Payer: Self-pay | Admitting: Vascular Surgery

## 2013-04-09 ENCOUNTER — Inpatient Hospital Stay: Payer: Self-pay | Admitting: Internal Medicine

## 2013-04-09 LAB — CBC WITH DIFFERENTIAL/PLATELET
Basophil #: 0.1 10*3/uL (ref 0.0–0.1)
Eosinophil #: 0.1 10*3/uL (ref 0.0–0.7)
HGB: 8.1 g/dL — ABNORMAL LOW (ref 12.0–16.0)
Lymphocyte %: 19.3 %
MCH: 32 pg (ref 26.0–34.0)
Monocyte #: 0.5 x10 3/mm (ref 0.2–0.9)
Monocyte %: 4.9 %
Neutrophil #: 8.2 10*3/uL — ABNORMAL HIGH (ref 1.4–6.5)
Neutrophil %: 73.8 %
RDW: 15.2 % — ABNORMAL HIGH (ref 11.5–14.5)

## 2013-04-09 LAB — COMPREHENSIVE METABOLIC PANEL
Albumin: 2.9 g/dL — ABNORMAL LOW (ref 3.4–5.0)
BUN: 18 mg/dL (ref 7–18)
Bilirubin,Total: 0.4 mg/dL (ref 0.2–1.0)
Co2: 32 mmol/L (ref 21–32)
Creatinine: 6.97 mg/dL — ABNORMAL HIGH (ref 0.60–1.30)
EGFR (African American): 8 — ABNORMAL LOW
EGFR (Non-African Amer.): 7 — ABNORMAL LOW
Glucose: 128 mg/dL — ABNORMAL HIGH (ref 65–99)
SGOT(AST): 12 U/L — ABNORMAL LOW (ref 15–37)
Sodium: 139 mmol/L (ref 136–145)
Total Protein: 6.7 g/dL (ref 6.4–8.2)

## 2013-04-10 LAB — CBC WITH DIFFERENTIAL/PLATELET
Basophil #: 0 10*3/uL (ref 0.0–0.1)
Basophil %: 0.4 %
Eosinophil %: 3.4 %
HCT: 20.8 % — ABNORMAL LOW (ref 35.0–47.0)
Lymphocyte #: 1.3 10*3/uL (ref 1.0–3.6)
Lymphocyte %: 19 %
MCH: 32.3 pg (ref 26.0–34.0)
MCV: 96 fL (ref 80–100)
Monocyte #: 0.4 x10 3/mm (ref 0.2–0.9)
Monocyte %: 6.3 %
Neutrophil %: 70.9 %
Platelet: 169 10*3/uL (ref 150–440)
RDW: 15.5 % — ABNORMAL HIGH (ref 11.5–14.5)
WBC: 6.8 10*3/uL (ref 3.6–11.0)

## 2013-04-10 LAB — COMPREHENSIVE METABOLIC PANEL
Anion Gap: 6 — ABNORMAL LOW (ref 7–16)
BUN: 24 mg/dL — ABNORMAL HIGH (ref 7–18)
Calcium, Total: 8.6 mg/dL (ref 8.5–10.1)
Co2: 30 mmol/L (ref 21–32)
Creatinine: 8.78 mg/dL — ABNORMAL HIGH (ref 0.60–1.30)
EGFR (African American): 6 — ABNORMAL LOW
EGFR (Non-African Amer.): 5 — ABNORMAL LOW
Potassium: 4.7 mmol/L (ref 3.5–5.1)

## 2013-04-11 LAB — CBC WITH DIFFERENTIAL/PLATELET
Basophil %: 0.6 %
Eosinophil %: 4.1 %
Monocyte #: 0.3 x10 3/mm (ref 0.2–0.9)
Monocyte %: 5.4 %
Neutrophil #: 2.8 10*3/uL (ref 1.4–6.5)

## 2013-04-15 LAB — CULTURE, BLOOD (SINGLE)

## 2013-06-15 ENCOUNTER — Emergency Department: Payer: Self-pay | Admitting: Emergency Medicine

## 2013-10-29 ENCOUNTER — Emergency Department: Payer: Self-pay | Admitting: Emergency Medicine

## 2013-10-29 LAB — LIPASE, BLOOD: Lipase: 185 U/L (ref 73–393)

## 2013-10-29 LAB — COMPREHENSIVE METABOLIC PANEL
Albumin: 3 g/dL — ABNORMAL LOW (ref 3.4–5.0)
Anion Gap: 7 (ref 7–16)
BUN: 78 mg/dL — ABNORMAL HIGH (ref 7–18)
Bilirubin,Total: 0.3 mg/dL (ref 0.2–1.0)
Creatinine: 10.83 mg/dL — ABNORMAL HIGH (ref 0.60–1.30)
EGFR (African American): 5 — ABNORMAL LOW
Osmolality: 297 (ref 275–301)
Potassium: 6.2 mmol/L — ABNORMAL HIGH (ref 3.5–5.1)
SGOT(AST): 26 U/L (ref 15–37)
SGPT (ALT): 14 U/L (ref 12–78)
Sodium: 135 mmol/L — ABNORMAL LOW (ref 136–145)
Total Protein: 6.3 g/dL — ABNORMAL LOW (ref 6.4–8.2)

## 2013-10-29 LAB — CBC
HGB: 6.8 g/dL — ABNORMAL LOW (ref 12.0–16.0)
MCH: 32.3 pg (ref 26.0–34.0)
MCHC: 33 g/dL (ref 32.0–36.0)
MCV: 98 fL (ref 80–100)
RBC: 2.12 10*6/uL — ABNORMAL LOW (ref 3.80–5.20)

## 2013-10-29 LAB — PHOSPHORUS: Phosphorus: 4.6 mg/dL (ref 2.5–4.9)

## 2013-12-14 ENCOUNTER — Other Ambulatory Visit: Payer: Self-pay | Admitting: Nephrology

## 2013-12-14 LAB — POTASSIUM: Potassium: 3.6 mmol/L (ref 3.5–5.1)

## 2014-05-22 ENCOUNTER — Emergency Department: Payer: Self-pay | Admitting: Emergency Medicine

## 2014-06-02 ENCOUNTER — Emergency Department: Payer: Self-pay | Admitting: Emergency Medicine

## 2014-06-02 LAB — CBC
HCT: 39.6 % (ref 35.0–47.0)
HGB: 13.4 g/dL (ref 12.0–16.0)
MCH: 32.6 pg (ref 26.0–34.0)
MCHC: 33.7 g/dL (ref 32.0–36.0)
MCV: 97 fL (ref 80–100)
Platelet: 151 10*3/uL (ref 150–440)
RBC: 4.09 10*6/uL (ref 3.80–5.20)
RDW: 14.4 % (ref 11.5–14.5)
WBC: 10.3 10*3/uL (ref 3.6–11.0)

## 2014-06-03 ENCOUNTER — Inpatient Hospital Stay: Payer: Self-pay | Admitting: Specialist

## 2014-06-03 LAB — CBC WITH DIFFERENTIAL/PLATELET
BASOS PCT: 0.3 %
Basophil #: 0 10*3/uL (ref 0.0–0.1)
EOS PCT: 0 %
Eosinophil #: 0 10*3/uL (ref 0.0–0.7)
HCT: 37.8 % (ref 35.0–47.0)
HGB: 12.5 g/dL (ref 12.0–16.0)
Lymphocyte #: 0.5 10*3/uL — ABNORMAL LOW (ref 1.0–3.6)
Lymphocyte %: 3.8 %
MCH: 31.8 pg (ref 26.0–34.0)
MCHC: 33 g/dL (ref 32.0–36.0)
MCV: 97 fL (ref 80–100)
Monocyte #: 0.3 x10 3/mm (ref 0.2–0.9)
Monocyte %: 2.7 %
Neutrophil #: 11.7 10*3/uL — ABNORMAL HIGH (ref 1.4–6.5)
Neutrophil %: 93.2 %
PLATELETS: 112 10*3/uL — AB (ref 150–440)
RBC: 3.92 10*6/uL (ref 3.80–5.20)
RDW: 14.4 % (ref 11.5–14.5)
WBC: 12.5 10*3/uL — ABNORMAL HIGH (ref 3.6–11.0)

## 2014-06-03 LAB — URINALYSIS, COMPLETE
Bacteria: NEGATIVE
Bilirubin,UR: NEGATIVE
Ketone: NEGATIVE
Leukocyte Esterase: NEGATIVE
NITRITE: NEGATIVE
PH: 9 (ref 4.5–8.0)
Protein: 500
SPECIFIC GRAVITY: 1.005 (ref 1.003–1.030)

## 2014-06-03 LAB — COMPREHENSIVE METABOLIC PANEL
Albumin: 3.4 g/dL (ref 3.4–5.0)
Alkaline Phosphatase: 131 U/L — ABNORMAL HIGH
Anion Gap: 15 (ref 7–16)
BUN: 83 mg/dL — ABNORMAL HIGH (ref 7–18)
Bilirubin,Total: 0.5 mg/dL (ref 0.2–1.0)
CREATININE: 14.91 mg/dL — AB (ref 0.60–1.30)
Calcium, Total: 9.7 mg/dL (ref 8.5–10.1)
Chloride: 89 mmol/L — ABNORMAL LOW (ref 98–107)
Co2: 24 mmol/L (ref 21–32)
EGFR (African American): 3 — ABNORMAL LOW
EGFR (Non-African Amer.): 3 — ABNORMAL LOW
GLUCOSE: 297 mg/dL — AB (ref 65–99)
Osmolality: 293 (ref 275–301)
Potassium: 4.9 mmol/L (ref 3.5–5.1)
SGOT(AST): 129 U/L — ABNORMAL HIGH (ref 15–37)
SGPT (ALT): 162 U/L — ABNORMAL HIGH (ref 12–78)
Sodium: 128 mmol/L — ABNORMAL LOW (ref 136–145)
Total Protein: 8.1 g/dL (ref 6.4–8.2)

## 2014-06-03 LAB — CBC
HCT: 41.1 % (ref 35.0–47.0)
HGB: 13.3 g/dL (ref 12.0–16.0)
MCH: 31 pg (ref 26.0–34.0)
MCHC: 32.4 g/dL (ref 32.0–36.0)
MCV: 96 fL (ref 80–100)
Platelet: 130 10*3/uL — ABNORMAL LOW (ref 150–440)
RBC: 4.29 10*6/uL (ref 3.80–5.20)
RDW: 14.6 % — ABNORMAL HIGH (ref 11.5–14.5)
WBC: 14.2 10*3/uL — AB (ref 3.6–11.0)

## 2014-06-03 LAB — BASIC METABOLIC PANEL
Anion Gap: 14 (ref 7–16)
BUN: 82 mg/dL — AB (ref 7–18)
CHLORIDE: 88 mmol/L — AB (ref 98–107)
CO2: 26 mmol/L (ref 21–32)
Calcium, Total: 10.1 mg/dL (ref 8.5–10.1)
Creatinine: 14.33 mg/dL — ABNORMAL HIGH (ref 0.60–1.30)
EGFR (Non-African Amer.): 3 — ABNORMAL LOW
GFR CALC AF AMER: 3 — AB
Glucose: 319 mg/dL — ABNORMAL HIGH (ref 65–99)
Osmolality: 294 (ref 275–301)
Potassium: 5.5 mmol/L — ABNORMAL HIGH (ref 3.5–5.1)
Sodium: 128 mmol/L — ABNORMAL LOW (ref 136–145)

## 2014-06-03 LAB — TROPONIN I: Troponin-I: <0.02 ng/mL

## 2014-06-03 LAB — LIPASE, BLOOD: Lipase: 372 U/L (ref 73–393)

## 2014-06-05 LAB — PLATELET COUNT: PLATELETS: 95 10*3/uL — AB (ref 150–440)

## 2014-06-05 LAB — PHOSPHORUS: Phosphorus: 3.8 mg/dL (ref 2.5–4.9)

## 2014-06-07 LAB — RENAL FUNCTION PANEL
ANION GAP: 12 (ref 7–16)
Albumin: 2.6 g/dL — ABNORMAL LOW (ref 3.4–5.0)
BUN: 38 mg/dL — ABNORMAL HIGH (ref 7–18)
CALCIUM: 9 mg/dL (ref 8.5–10.1)
Chloride: 92 mmol/L — ABNORMAL LOW (ref 98–107)
Co2: 28 mmol/L (ref 21–32)
Creatinine: 11.49 mg/dL — ABNORMAL HIGH (ref 0.60–1.30)
EGFR (African American): 5 — ABNORMAL LOW
EGFR (Non-African Amer.): 4 — ABNORMAL LOW
Glucose: 382 mg/dL — ABNORMAL HIGH (ref 65–99)
Osmolality: 289 (ref 275–301)
POTASSIUM: 4.1 mmol/L (ref 3.5–5.1)
Phosphorus: 4.2 mg/dL (ref 2.5–4.9)
SODIUM: 132 mmol/L — AB (ref 136–145)

## 2014-06-07 LAB — CBC WITH DIFFERENTIAL/PLATELET
Basophil #: 0 10*3/uL (ref 0.0–0.1)
Basophil %: 0.6 %
Eosinophil #: 0.3 10*3/uL (ref 0.0–0.7)
Eosinophil %: 4.5 %
HCT: 31.3 % — AB (ref 35.0–47.0)
HGB: 10.2 g/dL — ABNORMAL LOW (ref 12.0–16.0)
Lymphocyte #: 1.2 10*3/uL (ref 1.0–3.6)
Lymphocyte %: 19.8 %
MCH: 31.5 pg (ref 26.0–34.0)
MCHC: 32.6 g/dL (ref 32.0–36.0)
MCV: 97 fL (ref 80–100)
Monocyte #: 0.4 x10 3/mm (ref 0.2–0.9)
Monocyte %: 7.1 %
NEUTROS ABS: 4 10*3/uL (ref 1.4–6.5)
Neutrophil %: 68 %
PLATELETS: 102 10*3/uL — AB (ref 150–440)
RBC: 3.23 10*6/uL — ABNORMAL LOW (ref 3.80–5.20)
RDW: 14.4 % (ref 11.5–14.5)
WBC: 5.8 10*3/uL (ref 3.6–11.0)

## 2014-06-08 LAB — CBC WITH DIFFERENTIAL/PLATELET
BASOS ABS: 0 10*3/uL (ref 0.0–0.1)
Basophil %: 0.5 %
Eosinophil #: 0.3 10*3/uL (ref 0.0–0.7)
Eosinophil %: 3.9 %
HCT: 28.6 % — ABNORMAL LOW (ref 35.0–47.0)
HGB: 9.4 g/dL — ABNORMAL LOW (ref 12.0–16.0)
LYMPHS ABS: 1.5 10*3/uL (ref 1.0–3.6)
Lymphocyte %: 20.6 %
MCH: 31.7 pg (ref 26.0–34.0)
MCHC: 32.9 g/dL (ref 32.0–36.0)
MCV: 96 fL (ref 80–100)
Monocyte #: 0.5 x10 3/mm (ref 0.2–0.9)
Monocyte %: 6.9 %
Neutrophil #: 5 10*3/uL (ref 1.4–6.5)
Neutrophil %: 68.1 %
Platelet: 116 10*3/uL — ABNORMAL LOW (ref 150–440)
RBC: 2.97 10*6/uL — ABNORMAL LOW (ref 3.80–5.20)
RDW: 14.7 % — ABNORMAL HIGH (ref 11.5–14.5)
WBC: 7.3 10*3/uL (ref 3.6–11.0)

## 2014-06-08 LAB — CULTURE, BLOOD (SINGLE)

## 2014-06-09 LAB — PHOSPHORUS: PHOSPHORUS: 5.2 mg/dL — AB (ref 2.5–4.9)

## 2014-06-11 LAB — RENAL FUNCTION PANEL
ANION GAP: 8 (ref 7–16)
Albumin: 2.4 g/dL — ABNORMAL LOW (ref 3.4–5.0)
BUN: 27 mg/dL — ABNORMAL HIGH (ref 7–18)
CHLORIDE: 94 mmol/L — AB (ref 98–107)
CO2: 29 mmol/L (ref 21–32)
Calcium, Total: 9.1 mg/dL (ref 8.5–10.1)
Creatinine: 9.9 mg/dL — ABNORMAL HIGH (ref 0.60–1.30)
EGFR (Non-African Amer.): 5 — ABNORMAL LOW
GFR CALC AF AMER: 5 — AB
Glucose: 302 mg/dL — ABNORMAL HIGH (ref 65–99)
OSMOLALITY: 279 (ref 275–301)
PHOSPHORUS: 5.4 mg/dL — AB (ref 2.5–4.9)
POTASSIUM: 4.4 mmol/L (ref 3.5–5.1)
Sodium: 131 mmol/L — ABNORMAL LOW (ref 136–145)

## 2014-06-12 LAB — CULTURE, BLOOD (SINGLE)

## 2014-06-12 LAB — PLATELET COUNT: Platelet: 206 10*3/uL (ref 150–440)

## 2014-06-14 LAB — RENAL FUNCTION PANEL
Albumin: 2.3 g/dL — ABNORMAL LOW (ref 3.4–5.0)
Anion Gap: 10 (ref 7–16)
BUN: 34 mg/dL — ABNORMAL HIGH (ref 7–18)
CO2: 28 mmol/L (ref 21–32)
CREATININE: 10.79 mg/dL — AB (ref 0.60–1.30)
Calcium, Total: 8.6 mg/dL (ref 8.5–10.1)
Chloride: 96 mmol/L — ABNORMAL LOW (ref 98–107)
GFR CALC AF AMER: 5 — AB
GFR CALC NON AF AMER: 4 — AB
Glucose: 145 mg/dL — ABNORMAL HIGH (ref 65–99)
Osmolality: 278 (ref 275–301)
PHOSPHORUS: 5.9 mg/dL — AB (ref 2.5–4.9)
Potassium: 4.9 mmol/L (ref 3.5–5.1)
Sodium: 134 mmol/L — ABNORMAL LOW (ref 136–145)

## 2014-06-16 LAB — PHOSPHORUS: Phosphorus: 5.8 mg/dL — ABNORMAL HIGH (ref 2.5–4.9)

## 2014-06-16 LAB — PLATELET COUNT: Platelet: 207 10*3/uL (ref 150–440)

## 2014-06-21 LAB — CBC
HCT: 24.6 % — AB (ref 35.0–47.0)
HGB: 7.8 g/dL — AB (ref 12.0–16.0)
MCH: 31.2 pg (ref 26.0–34.0)
MCHC: 31.8 g/dL — AB (ref 32.0–36.0)
MCV: 98 fL (ref 80–100)
PLATELETS: 194 10*3/uL (ref 150–440)
RBC: 2.51 10*6/uL — ABNORMAL LOW (ref 3.80–5.20)
RDW: 16.2 % — ABNORMAL HIGH (ref 11.5–14.5)
WBC: 6.6 10*3/uL (ref 3.6–11.0)

## 2014-06-21 LAB — COMPREHENSIVE METABOLIC PANEL
ALK PHOS: 135 U/L — AB
ANION GAP: 9 (ref 7–16)
Albumin: 2.9 g/dL — ABNORMAL LOW (ref 3.4–5.0)
BUN: 68 mg/dL — AB (ref 7–18)
Bilirubin,Total: 0.4 mg/dL (ref 0.2–1.0)
CHLORIDE: 88 mmol/L — AB (ref 98–107)
CO2: 29 mmol/L (ref 21–32)
Calcium, Total: 8.9 mg/dL (ref 8.5–10.1)
Creatinine: 14.9 mg/dL — ABNORMAL HIGH (ref 0.60–1.30)
EGFR (African American): 3 — ABNORMAL LOW
EGFR (Non-African Amer.): 3 — ABNORMAL LOW
Glucose: 319 mg/dL — ABNORMAL HIGH (ref 65–99)
Osmolality: 285 (ref 275–301)
Potassium: 7.5 mmol/L (ref 3.5–5.1)
SGOT(AST): 17 U/L (ref 15–37)
Sodium: 126 mmol/L — ABNORMAL LOW (ref 136–145)
Total Protein: 8.4 g/dL — ABNORMAL HIGH (ref 6.4–8.2)

## 2014-06-22 ENCOUNTER — Inpatient Hospital Stay: Payer: Self-pay | Admitting: Internal Medicine

## 2014-06-22 LAB — POTASSIUM
POTASSIUM: 6.7 mmol/L — AB (ref 3.5–5.1)
Potassium: 3.8 mmol/L (ref 3.5–5.1)

## 2014-06-23 LAB — BASIC METABOLIC PANEL
Anion Gap: 7 (ref 7–16)
Anion Gap: 9 (ref 7–16)
BUN: 19 mg/dL — AB (ref 7–18)
BUN: 27 mg/dL — ABNORMAL HIGH (ref 7–18)
CHLORIDE: 97 mmol/L — AB (ref 98–107)
CREATININE: 8.18 mg/dL — AB (ref 0.60–1.30)
Calcium, Total: 8.3 mg/dL — ABNORMAL LOW (ref 8.5–10.1)
Calcium, Total: 8.9 mg/dL (ref 8.5–10.1)
Chloride: 92 mmol/L — ABNORMAL LOW (ref 98–107)
Co2: 33 mmol/L — ABNORMAL HIGH (ref 21–32)
Co2: 33 mmol/L — ABNORMAL HIGH (ref 21–32)
Creatinine: 6.27 mg/dL — ABNORMAL HIGH (ref 0.60–1.30)
EGFR (African American): 9 — ABNORMAL LOW
EGFR (Non-African Amer.): 8 — ABNORMAL LOW
GFR CALC AF AMER: 7 — AB
GFR CALC NON AF AMER: 6 — AB
Glucose: 232 mg/dL — ABNORMAL HIGH (ref 65–99)
Glucose: 216 mg/dL — ABNORMAL HIGH (ref 65–99)
Osmolality: 283 (ref 275–301)
Osmolality: 280 (ref 275–301)
Potassium: 5 mmol/L (ref 3.5–5.1)
Potassium: 4.6 mmol/L (ref 3.5–5.1)
SODIUM: 137 mmol/L (ref 136–145)
SODIUM: 134 mmol/L — AB (ref 136–145)

## 2014-06-23 LAB — CBC WITH DIFFERENTIAL/PLATELET
BASOS ABS: 0.1 10*3/uL (ref 0.0–0.1)
Basophil %: 1.8 %
Eosinophil #: 0.2 10*3/uL (ref 0.0–0.7)
Eosinophil %: 4.3 %
HCT: 21.2 % — ABNORMAL LOW (ref 35.0–47.0)
HGB: 7 g/dL — ABNORMAL LOW (ref 12.0–16.0)
LYMPHS PCT: 44.5 %
Lymphocyte #: 1.8 10*3/uL (ref 1.0–3.6)
MCH: 32.1 pg (ref 26.0–34.0)
MCHC: 32.9 g/dL (ref 32.0–36.0)
MCV: 98 fL (ref 80–100)
MONO ABS: 0.4 x10 3/mm (ref 0.2–0.9)
Monocyte %: 9.8 %
NEUTROS ABS: 1.6 10*3/uL (ref 1.4–6.5)
Neutrophil %: 39.6 %
Platelet: 158 10*3/uL (ref 150–440)
RBC: 2.17 10*6/uL — ABNORMAL LOW (ref 3.80–5.20)
RDW: 16.4 % — AB (ref 11.5–14.5)
WBC: 4 10*3/uL (ref 3.6–11.0)

## 2014-06-23 LAB — MAGNESIUM: Magnesium: 2 mg/dL

## 2014-06-25 LAB — RENAL FUNCTION PANEL
ANION GAP: 6 — AB (ref 7–16)
Albumin: 2.5 g/dL — ABNORMAL LOW (ref 3.4–5.0)
BUN: 35 mg/dL — ABNORMAL HIGH (ref 7–18)
CALCIUM: 8.7 mg/dL (ref 8.5–10.1)
CHLORIDE: 95 mmol/L — AB (ref 98–107)
CO2: 33 mmol/L — AB (ref 21–32)
Creatinine: 6.93 mg/dL — ABNORMAL HIGH (ref 0.60–1.30)
EGFR (Non-African Amer.): 7 — ABNORMAL LOW
GFR CALC AF AMER: 8 — AB
GLUCOSE: 133 mg/dL — AB (ref 65–99)
OSMOLALITY: 278 (ref 275–301)
PHOSPHORUS: 3.2 mg/dL (ref 2.5–4.9)
Potassium: 5.6 mmol/L — ABNORMAL HIGH (ref 3.5–5.1)
Sodium: 134 mmol/L — ABNORMAL LOW (ref 136–145)

## 2014-06-27 LAB — BASIC METABOLIC PANEL
ANION GAP: 12 (ref 7–16)
BUN: 56 mg/dL — ABNORMAL HIGH (ref 7–18)
CALCIUM: 8.8 mg/dL (ref 8.5–10.1)
CO2: 27 mmol/L (ref 21–32)
Chloride: 95 mmol/L — ABNORMAL LOW (ref 98–107)
Creatinine: 8.09 mg/dL — ABNORMAL HIGH (ref 0.60–1.30)
EGFR (African American): 7 — ABNORMAL LOW
EGFR (Non-African Amer.): 6 — ABNORMAL LOW
GLUCOSE: 188 mg/dL — AB (ref 65–99)
OSMOLALITY: 289 (ref 275–301)
POTASSIUM: 6.9 mmol/L — AB (ref 3.5–5.1)
SODIUM: 134 mmol/L — AB (ref 136–145)

## 2014-06-27 LAB — PHOSPHORUS: Phosphorus: 2.6 mg/dL (ref 2.5–4.9)

## 2014-06-27 LAB — PLATELET COUNT: PLATELETS: 206 10*3/uL (ref 150–440)

## 2014-06-28 LAB — RENAL FUNCTION PANEL
ALBUMIN: 2.8 g/dL — AB (ref 3.4–5.0)
Anion Gap: 4 — ABNORMAL LOW (ref 7–16)
BUN: 24 mg/dL — ABNORMAL HIGH (ref 7–18)
CO2: 34 mmol/L — AB (ref 21–32)
Calcium, Total: 8.8 mg/dL (ref 8.5–10.1)
Chloride: 98 mmol/L (ref 98–107)
Creatinine: 5.48 mg/dL — ABNORMAL HIGH (ref 0.60–1.30)
EGFR (African American): 11 — ABNORMAL LOW
EGFR (Non-African Amer.): 10 — ABNORMAL LOW
Glucose: 172 mg/dL — ABNORMAL HIGH (ref 65–99)
OSMOLALITY: 280 (ref 275–301)
Phosphorus: 3.5 mg/dL (ref 2.5–4.9)
Potassium: 4.8 mmol/L (ref 3.5–5.1)
SODIUM: 136 mmol/L (ref 136–145)

## 2014-06-28 LAB — PLATELET COUNT: Platelet: 240 10*3/uL (ref 150–440)

## 2014-06-30 ENCOUNTER — Emergency Department: Payer: Self-pay | Admitting: Emergency Medicine

## 2014-06-30 LAB — CBC
HCT: 21.4 % — ABNORMAL LOW (ref 35.0–47.0)
HGB: 6.7 g/dL — AB (ref 12.0–16.0)
MCH: 31 pg (ref 26.0–34.0)
MCHC: 31.2 g/dL — ABNORMAL LOW (ref 32.0–36.0)
MCV: 99 fL (ref 80–100)
Platelet: 268 10*3/uL (ref 150–440)
RBC: 2.16 10*6/uL — AB (ref 3.80–5.20)
RDW: 16.5 % — ABNORMAL HIGH (ref 11.5–14.5)
WBC: 5.3 10*3/uL (ref 3.6–11.0)

## 2014-06-30 LAB — BASIC METABOLIC PANEL
ANION GAP: 8 (ref 7–16)
BUN: 39 mg/dL — ABNORMAL HIGH (ref 7–18)
Calcium, Total: 8.9 mg/dL (ref 8.5–10.1)
Chloride: 96 mmol/L — ABNORMAL LOW (ref 98–107)
Co2: 30 mmol/L (ref 21–32)
Creatinine: 7.02 mg/dL — ABNORMAL HIGH (ref 0.60–1.30)
EGFR (African American): 8 — ABNORMAL LOW
EGFR (Non-African Amer.): 7 — ABNORMAL LOW
GLUCOSE: 280 mg/dL — AB (ref 65–99)
Osmolality: 288 (ref 275–301)
POTASSIUM: 4.6 mmol/L (ref 3.5–5.1)
Sodium: 134 mmol/L — ABNORMAL LOW (ref 136–145)

## 2014-06-30 LAB — VALPROIC ACID LEVEL: VALPROIC ACID: 61 ug/mL

## 2014-07-09 ENCOUNTER — Other Ambulatory Visit: Payer: Self-pay | Admitting: Nephrology

## 2014-07-09 LAB — POTASSIUM
POTASSIUM: 4.2 mmol/L (ref 3.5–5.1)
Potassium: 2.4 mmol/L — CL (ref 3.5–5.1)

## 2014-08-02 ENCOUNTER — Emergency Department: Payer: Self-pay | Admitting: Student

## 2014-08-02 LAB — CBC
HCT: 40.4 % (ref 35.0–47.0)
HGB: 12.8 g/dL (ref 12.0–16.0)
MCH: 32.3 pg (ref 26.0–34.0)
MCHC: 31.7 g/dL — AB (ref 32.0–36.0)
MCV: 102 fL — ABNORMAL HIGH (ref 80–100)
Platelet: 246 10*3/uL (ref 150–440)
RBC: 3.96 10*6/uL (ref 3.80–5.20)
RDW: 16.9 % — AB (ref 11.5–14.5)
WBC: 6.5 10*3/uL (ref 3.6–11.0)

## 2014-08-02 LAB — COMPREHENSIVE METABOLIC PANEL
ALK PHOS: 83 U/L
ALT: 11 U/L — AB
ANION GAP: 17 — AB (ref 7–16)
AST: 26 U/L (ref 15–37)
Albumin: 3.5 g/dL (ref 3.4–5.0)
BILIRUBIN TOTAL: 0.4 mg/dL (ref 0.2–1.0)
BUN: 75 mg/dL — ABNORMAL HIGH (ref 7–18)
CO2: 21 mmol/L (ref 21–32)
Calcium, Total: 9.4 mg/dL (ref 8.5–10.1)
Chloride: 99 mmol/L (ref 98–107)
Creatinine: 12.8 mg/dL — ABNORMAL HIGH (ref 0.60–1.30)
EGFR (African American): 4 — ABNORMAL LOW
EGFR (Non-African Amer.): 3 — ABNORMAL LOW
GLUCOSE: 134 mg/dL — AB (ref 65–99)
OSMOLALITY: 298 (ref 275–301)
Potassium: 4.6 mmol/L (ref 3.5–5.1)
Sodium: 137 mmol/L (ref 136–145)
Total Protein: 8.4 g/dL — ABNORMAL HIGH (ref 6.4–8.2)

## 2014-08-02 LAB — HCG, QUANTITATIVE, PREGNANCY: BETA HCG, QUANT.: 1 m[IU]/mL

## 2014-08-02 LAB — LIPASE, BLOOD: Lipase: 328 U/L (ref 73–393)

## 2014-10-26 ENCOUNTER — Ambulatory Visit: Payer: Self-pay | Admitting: Vascular Surgery

## 2014-11-22 ENCOUNTER — Emergency Department: Payer: Self-pay | Admitting: Emergency Medicine

## 2014-11-22 LAB — CBC WITH DIFFERENTIAL/PLATELET
BASOS ABS: 1 %
Bands: 1 %
EOS PCT: 1 %
HCT: 43.4 % (ref 35.0–47.0)
HGB: 13.7 g/dL (ref 12.0–16.0)
Lymphocytes: 23 %
MCH: 31.6 pg (ref 26.0–34.0)
MCHC: 31.6 g/dL — ABNORMAL LOW (ref 32.0–36.0)
MCV: 100 fL (ref 80–100)
MYELOCYTE: 1 %
Metamyelocyte: 2 %
Monocytes: 5 %
PLATELETS: 153 10*3/uL (ref 150–440)
RBC: 4.34 10*6/uL (ref 3.80–5.20)
RDW: 17.3 % — ABNORMAL HIGH (ref 11.5–14.5)
SEGMENTED NEUTROPHILS: 66 %
WBC: 6.7 10*3/uL (ref 3.6–11.0)

## 2014-11-22 LAB — COMPREHENSIVE METABOLIC PANEL
ALBUMIN: 2.8 g/dL — AB (ref 3.4–5.0)
AST: 23 U/L (ref 15–37)
Alkaline Phosphatase: 90 U/L
Anion Gap: 14 (ref 7–16)
BILIRUBIN TOTAL: 0.5 mg/dL (ref 0.2–1.0)
BUN: 85 mg/dL — ABNORMAL HIGH (ref 7–18)
CREATININE: 11.95 mg/dL — AB (ref 0.60–1.30)
Calcium, Total: 8.5 mg/dL (ref 8.5–10.1)
Chloride: 99 mmol/L (ref 98–107)
Co2: 23 mmol/L (ref 21–32)
EGFR (Non-African Amer.): 4 — ABNORMAL LOW
GFR CALC AF AMER: 5 — AB
Glucose: 324 mg/dL — ABNORMAL HIGH (ref 65–99)
Osmolality: 310 (ref 275–301)
POTASSIUM: 4.8 mmol/L (ref 3.5–5.1)
SGPT (ALT): 9 U/L — ABNORMAL LOW
SODIUM: 136 mmol/L (ref 136–145)
Total Protein: 7 g/dL (ref 6.4–8.2)

## 2014-11-22 LAB — LIPASE, BLOOD: Lipase: 278 U/L (ref 73–393)

## 2014-11-22 LAB — HCG, QUANTITATIVE, PREGNANCY: Beta Hcg, Quant.: <1 m[IU]/mL — ABNORMAL LOW

## 2014-11-22 LAB — CLOSTRIDIUM DIFFICILE(ARMC)

## 2014-12-07 ENCOUNTER — Ambulatory Visit: Payer: Self-pay | Admitting: Vascular Surgery

## 2014-12-07 LAB — HCG, QUANTITATIVE, PREGNANCY: BETA HCG, QUANT.: 1 m[IU]/mL

## 2014-12-07 LAB — POTASSIUM: POTASSIUM: 5 mmol/L (ref 3.5–5.1)

## 2014-12-10 ENCOUNTER — Inpatient Hospital Stay: Payer: Self-pay | Admitting: Internal Medicine

## 2014-12-10 LAB — CBC WITH DIFFERENTIAL/PLATELET
Basophil #: 0 10*3/uL (ref 0.0–0.1)
Basophil %: 0.7 %
EOS PCT: 1.5 %
Eosinophil #: 0.1 10*3/uL (ref 0.0–0.7)
HCT: 33.6 % — AB (ref 35.0–47.0)
HGB: 10.5 g/dL — ABNORMAL LOW (ref 12.0–16.0)
LYMPHS ABS: 1.1 10*3/uL (ref 1.0–3.6)
Lymphocyte %: 15.6 %
MCH: 31 pg (ref 26.0–34.0)
MCHC: 31.4 g/dL — ABNORMAL LOW (ref 32.0–36.0)
MCV: 99 fL (ref 80–100)
Monocyte #: 0.4 x10 3/mm (ref 0.2–0.9)
Monocyte %: 6 %
NEUTROS ABS: 5.2 10*3/uL (ref 1.4–6.5)
Neutrophil %: 76.2 %
Platelet: 187 10*3/uL (ref 150–440)
RBC: 3.4 10*6/uL — AB (ref 3.80–5.20)
RDW: 15.4 % — AB (ref 11.5–14.5)
WBC: 6.8 10*3/uL (ref 3.6–11.0)

## 2014-12-10 LAB — COMPREHENSIVE METABOLIC PANEL
ALK PHOS: 91 U/L
ANION GAP: 11 (ref 7–16)
Albumin: 2.8 g/dL — ABNORMAL LOW (ref 3.4–5.0)
BUN: 48 mg/dL — AB (ref 7–18)
Bilirubin,Total: 0.3 mg/dL (ref 0.2–1.0)
CO2: 30 mmol/L (ref 21–32)
CREATININE: 14.95 mg/dL — AB (ref 0.60–1.30)
Calcium, Total: 9.3 mg/dL (ref 8.5–10.1)
Chloride: 90 mmol/L — ABNORMAL LOW (ref 98–107)
EGFR (Non-African Amer.): 3 — ABNORMAL LOW
GFR CALC AF AMER: 4 — AB
Glucose: 327 mg/dL — ABNORMAL HIGH (ref 65–99)
Osmolality: 288 (ref 275–301)
Potassium: 4.4 mmol/L (ref 3.5–5.1)
SGOT(AST): 10 U/L — ABNORMAL LOW (ref 15–37)
SGPT (ALT): <6 U/L — ABNORMAL LOW
Sodium: 131 mmol/L — ABNORMAL LOW (ref 136–145)
TOTAL PROTEIN: 7.4 g/dL (ref 6.4–8.2)

## 2014-12-10 LAB — PHOSPHORUS: Phosphorus: 2.4 mg/dL — ABNORMAL LOW (ref 2.5–4.9)

## 2014-12-11 LAB — BASIC METABOLIC PANEL
Anion Gap: 9 (ref 7–16)
BUN: 24 mg/dL — ABNORMAL HIGH (ref 7–18)
CALCIUM: 8.8 mg/dL (ref 8.5–10.1)
CHLORIDE: 97 mmol/L — AB (ref 98–107)
Co2: 30 mmol/L (ref 21–32)
Creatinine: 9.38 mg/dL — ABNORMAL HIGH (ref 0.60–1.30)
EGFR (African American): 6 — ABNORMAL LOW
EGFR (Non-African Amer.): 5 — ABNORMAL LOW
Glucose: 313 mg/dL — ABNORMAL HIGH (ref 65–99)
OSMOLALITY: 288 (ref 275–301)
Potassium: 4.4 mmol/L (ref 3.5–5.1)
Sodium: 136 mmol/L (ref 136–145)

## 2014-12-11 LAB — PHOSPHORUS: Phosphorus: 1.7 mg/dL — ABNORMAL LOW (ref 2.5–4.9)

## 2014-12-13 LAB — RENAL FUNCTION PANEL
Albumin: 2.2 g/dL — ABNORMAL LOW (ref 3.4–5.0)
Anion Gap: 9 (ref 7–16)
BUN: 31 mg/dL — ABNORMAL HIGH (ref 7–18)
CALCIUM: 8.7 mg/dL (ref 8.5–10.1)
Chloride: 98 mmol/L (ref 98–107)
Co2: 30 mmol/L (ref 21–32)
Creatinine: 9.72 mg/dL — ABNORMAL HIGH (ref 0.60–1.30)
EGFR (African American): 6 — ABNORMAL LOW
EGFR (Non-African Amer.): 5 — ABNORMAL LOW
GLUCOSE: 270 mg/dL — AB (ref 65–99)
OSMOLALITY: 290 (ref 275–301)
Phosphorus: 2.9 mg/dL (ref 2.5–4.9)
Potassium: 4.1 mmol/L (ref 3.5–5.1)
Sodium: 137 mmol/L (ref 136–145)

## 2014-12-13 LAB — CBC WITH DIFFERENTIAL/PLATELET
Bands: 1 %
Basophil: 1 %
EOS PCT: 3 %
HCT: 29.3 % — ABNORMAL LOW (ref 35.0–47.0)
HGB: 9.2 g/dL — AB (ref 12.0–16.0)
Lymphocytes: 35 %
MCH: 31 pg (ref 26.0–34.0)
MCHC: 31.3 g/dL — ABNORMAL LOW (ref 32.0–36.0)
MCV: 99 fL (ref 80–100)
MYELOCYTE: 3 %
Metamyelocyte: 4 %
Monocytes: 10 %
PLATELETS: 148 10*3/uL — AB (ref 150–440)
RBC: 2.96 10*6/uL — ABNORMAL LOW (ref 3.80–5.20)
RDW: 15.2 % — ABNORMAL HIGH (ref 11.5–14.5)
Segmented Neutrophils: 43 %
WBC: 5.6 10*3/uL (ref 3.6–11.0)

## 2014-12-13 LAB — VANCOMYCIN, TROUGH: VANCOMYCIN, TROUGH: 23 ug/mL — AB (ref 10–20)

## 2014-12-13 LAB — RAPID HIV SCREEN (HIV 1/2 AB+AG)

## 2014-12-15 LAB — VANCOMYCIN, TROUGH: Vancomycin, Trough: 17 ug/mL (ref 10–20)

## 2014-12-16 LAB — CBC WITH DIFFERENTIAL/PLATELET
COMMENT - H1-COM2: NORMAL
Eosinophil: 1 %
HCT: 28.8 % — AB (ref 35.0–47.0)
HGB: 8.9 g/dL — ABNORMAL LOW (ref 12.0–16.0)
Lymphocytes: 30 %
MCH: 31.1 pg (ref 26.0–34.0)
MCHC: 31 g/dL — AB (ref 32.0–36.0)
MCV: 101 fL — AB (ref 80–100)
MYELOCYTE: 2 %
Metamyelocyte: 2 %
Monocytes: 3 %
Platelet: 146 10*3/uL — ABNORMAL LOW (ref 150–440)
RBC: 2.87 10*6/uL — AB (ref 3.80–5.20)
RDW: 15.5 % — ABNORMAL HIGH (ref 11.5–14.5)
SEGMENTED NEUTROPHILS: 62 %
WBC: 7.3 10*3/uL (ref 3.6–11.0)

## 2014-12-16 LAB — RENAL FUNCTION PANEL
ANION GAP: 9 (ref 7–16)
Albumin: 2.3 g/dL — ABNORMAL LOW (ref 3.4–5.0)
BUN: 50 mg/dL — AB (ref 7–18)
CREATININE: 7.21 mg/dL — AB (ref 0.60–1.30)
Calcium, Total: 8.9 mg/dL (ref 8.5–10.1)
Chloride: 98 mmol/L (ref 98–107)
Co2: 30 mmol/L (ref 21–32)
GFR CALC AF AMER: 8 — AB
GFR CALC NON AF AMER: 7 — AB
Glucose: 302 mg/dL — ABNORMAL HIGH (ref 65–99)
Osmolality: 298 (ref 275–301)
Phosphorus: 2.1 mg/dL — ABNORMAL LOW (ref 2.5–4.9)
Potassium: 4.6 mmol/L (ref 3.5–5.1)
Sodium: 137 mmol/L (ref 136–145)

## 2014-12-16 LAB — CULTURE, BLOOD (SINGLE)

## 2014-12-17 LAB — PLATELET COUNT: Platelet: 136 10*3/uL — ABNORMAL LOW (ref 150–440)

## 2014-12-18 LAB — CBC WITH DIFFERENTIAL/PLATELET
BASOS ABS: 1 %
EOS PCT: 2 %
HCT: 29.1 % — AB (ref 35.0–47.0)
HGB: 8.9 g/dL — AB (ref 12.0–16.0)
LYMPHS PCT: 38 %
MCH: 30.6 pg (ref 26.0–34.0)
MCHC: 30.7 g/dL — ABNORMAL LOW (ref 32.0–36.0)
MCV: 100 fL (ref 80–100)
MONOS PCT: 5 %
MYELOCYTE: 2 %
Metamyelocyte: 3 %
Platelet: 138 10*3/uL — ABNORMAL LOW (ref 150–440)
RBC: 2.92 10*6/uL — ABNORMAL LOW (ref 3.80–5.20)
RDW: 16 % — ABNORMAL HIGH (ref 11.5–14.5)
Segmented Neutrophils: 49 %
WBC: 7.1 10*3/uL (ref 3.6–11.0)

## 2014-12-20 LAB — RENAL FUNCTION PANEL
Albumin: 2.6 g/dL — ABNORMAL LOW (ref 3.4–5.0)
Anion Gap: 10 (ref 7–16)
BUN: 79 mg/dL — ABNORMAL HIGH (ref 7–18)
CALCIUM: 8.8 mg/dL (ref 8.5–10.1)
Chloride: 100 mmol/L (ref 98–107)
Co2: 26 mmol/L (ref 21–32)
Creatinine: 9.41 mg/dL — ABNORMAL HIGH (ref 0.60–1.30)
EGFR (Non-African Amer.): 5 — ABNORMAL LOW
GFR CALC AF AMER: 6 — AB
GLUCOSE: 111 mg/dL — AB (ref 65–99)
OSMOLALITY: 296 (ref 275–301)
POTASSIUM: 6.1 mmol/L — AB (ref 3.5–5.1)
Phosphorus: 2.4 mg/dL — ABNORMAL LOW (ref 2.5–4.9)
SODIUM: 136 mmol/L (ref 136–145)

## 2014-12-22 LAB — CBC WITH DIFFERENTIAL/PLATELET
BASOS PCT: 0.4 %
Basophil #: 0 10*3/uL (ref 0.0–0.1)
Eosinophil #: 0.2 10*3/uL (ref 0.0–0.7)
Eosinophil %: 3.9 %
HCT: 25.8 % — AB (ref 35.0–47.0)
HGB: 8.1 g/dL — AB (ref 12.0–16.0)
LYMPHS PCT: 34.6 %
Lymphocyte #: 2.1 10*3/uL (ref 1.0–3.6)
MCH: 30.7 pg (ref 26.0–34.0)
MCHC: 31.4 g/dL — ABNORMAL LOW (ref 32.0–36.0)
MCV: 98 fL (ref 80–100)
Monocyte #: 0.4 x10 3/mm (ref 0.2–0.9)
Monocyte %: 6.3 %
NEUTROS ABS: 3.3 10*3/uL (ref 1.4–6.5)
Neutrophil %: 54.8 %
Platelet: 117 10*3/uL — ABNORMAL LOW (ref 150–440)
RBC: 2.64 10*6/uL — ABNORMAL LOW (ref 3.80–5.20)
RDW: 16.1 % — ABNORMAL HIGH (ref 11.5–14.5)
WBC: 6 10*3/uL (ref 3.6–11.0)

## 2014-12-22 LAB — BASIC METABOLIC PANEL
Anion Gap: 9 (ref 7–16)
BUN: 75 mg/dL — ABNORMAL HIGH (ref 7–18)
CREATININE: 9.24 mg/dL — AB (ref 0.60–1.30)
Calcium, Total: 9.3 mg/dL (ref 8.5–10.1)
Chloride: 102 mmol/L (ref 98–107)
Co2: 28 mmol/L (ref 21–32)
GFR CALC AF AMER: 6 — AB
GFR CALC NON AF AMER: 5 — AB
GLUCOSE: 83 mg/dL (ref 65–99)
Osmolality: 299 (ref 275–301)
POTASSIUM: 5.8 mmol/L — AB (ref 3.5–5.1)
SODIUM: 139 mmol/L (ref 136–145)

## 2014-12-22 LAB — PLATELET COUNT: Platelet: 130 10*3/uL — ABNORMAL LOW (ref 150–440)

## 2014-12-24 LAB — BASIC METABOLIC PANEL
Anion Gap: 7 (ref 7–16)
BUN: 75 mg/dL — AB (ref 7–18)
CREATININE: 8.81 mg/dL — AB (ref 0.60–1.30)
Calcium, Total: 8.6 mg/dL (ref 8.5–10.1)
Chloride: 100 mmol/L (ref 98–107)
Co2: 28 mmol/L (ref 21–32)
EGFR (African American): 7 — ABNORMAL LOW
EGFR (Non-African Amer.): 6 — ABNORMAL LOW
GLUCOSE: 138 mg/dL — AB (ref 65–99)
Osmolality: 295 (ref 275–301)
POTASSIUM: 6.3 mmol/L — AB (ref 3.5–5.1)
Sodium: 135 mmol/L — ABNORMAL LOW (ref 136–145)

## 2014-12-24 LAB — PHOSPHORUS: Phosphorus: 2.6 mg/dL (ref 2.5–4.9)

## 2014-12-29 LAB — CULTURE, BLOOD (SINGLE)

## 2015-03-15 NOTE — H&P (Signed)
PATIENT NAME:  Kelly [name], OGAZ MR#:  [id number] DATE OF BIRTH:  [birth year]  DATE OF ADMISSION:  10/06/2012  PRIMARY CARE PHYSICIAN: None local.  REFERRING PHYSICIAN: Graciella Freer, MD  CHIEF COMPLAINT: Vomiting today.   HISTORY OF PRESENT ILLNESS: This is a 34 year old African American female with a history of hypertension, diabetes, hyperlipidemia, and end stage renal disease who presented to ED with the above chief complaint. The patient is alert and awake but refused to answer questions, uncooperative. According to the ED physician, the patient came from [facility]. The patient developed nausea and vomiting today and she was noted to have a very high blood pressure at 245/118. The patient declined monitor and is uncooperative. I cannot obtain any history from her.   PAST MEDICAL HISTORY:  1. Hypertension.  2. Diabetes.  3. Hyperlipidemia.  4. History of brain injury. 5. Secondary hyperparathyroidism. 6. End stage renal disease on dialysis Monday, Wednesday, and Friday.  7. Bipolar disorder, borderline intellectual function, cognitive impairment.  PAST SURGICAL HISTORY:  1. Cholecystectomy.  2. AV fistula.  SOCIAL HISTORY: From previous documents, the patient smokes two cigarettes a day. The patient quit alcohol 1 to 2 years ago in. She used to snort cocaine but quit it for a while. She was a resident of a group home prior to being hospitalized at [facility]: Mother has diabetes.  REVIEW OF SYSTEMS: Unable to obtain at this time.   PHYSICAL EXAMINATION:   VITALS: Temperature 98.6, blood pressure 245/118, pulse 85, respirations 18, and oxygen saturation 99% on room air.   GENERAL: The patient is alert and awake but in no acute distress. She is uncooperative.  HEENT: Unable to do examination.   NECK: Supple. No JVD. No thyromegaly.   CARDIOVASCULAR: S1 and S2 regular rate and rhythm. No murmurs or gallops.   PULMONARY:  Bilateral air entry. No wheezing or rales. No use of accessory muscles to breathe.   ABDOMEN: Soft. Tenderness in the middle area. No rebound and no rigidity. Difficult to estimate whether the patient has organomegaly.   EXTREMITIES: No edema, clubbing, or cyanosis but has some chronic skin changes. Strong bilateral pedal pulses.   NEUROLOGIC: The patient is alert and awake but declined examination.   LABORATORY/DIAGNOSTIC DATA: Glucose 188, BUN 62, and creatinine 11.02.   Electrolytes normal.   WBC 8.7, hemoglobin 14.7, and platelets 134.  Lipase 123. Valproic acid 48.   EKG showed normal sinus rhythm at 80 beats per minute with prolonged QT.   IMPRESSION:  1. Hypertension malignancy. 2. Possible gastroparesis.  3. End stage renal disease. 4. Diabetes.  5. Psychosis.  6. Hyperlipidemia.  7. Thrombocytopenia.   PLAN OF TREATMENT: The patient will be admitted to the Critical Care Unit. We will start on nicardipine drip. We will continue the patient's hypertension medication. The patient can take p.o. medications and taper nicardipine drip.  We will get hemodialysis now. I discussed with Dr. Candiss Norse. For diabetes, we will start sliding scale and check Hemoglobin A1c. GI and DVT prophylaxis. Phenergan and Zofran for abdominal pain, nausea, and vomiting. In addition, we will get a psych consultation.   I discussed the patient's situation with Dr. Candiss Norse and caregiver from [facility]: About 65 minutes.  ____________________________ Demetrios Loll, MD qc:slb D: 10/06/2012 15:19:03 ET T: 10/06/2012 15:41:38 ET JOB#: [id number]  cc: Demetrios Loll, MD, <Dictator> Demetrios Loll MD ELECTRONICALLY SIGNED 10/07/2012 16:42

## 2015-03-15 NOTE — Op Note (Signed)
PATIENT NAME:  Kelly [name], BERMINGHAM MR#:  [id number] DATE OF BIRTH:  10/19/1981  DATE OF PROCEDURE:  07/15/2012  PREOPERATIVE DIAGNOSES:  1. Complication arteriovenous dialysis device, left arteriovenous graft.  2. End-stage renal disease requiring hemodialysis.   POSTOPERATIVE DIAGNOSES:  1. Complication arteriovenous dialysis device, left arteriovenous graft.  2. End-stage renal disease requiring hemodialysis.   PROCEDURES PERFORMED:  1. Contrast injection left arm AV graft.  2. Percutaneous transluminal angioplasty of left axillary vein.  3. Placement of a Fluency stent left axillary vein for failed angioplasty.   SURGEON: Katha Cabal, MD   SEDATION: Versed 3 mg plus fentanyl 100 mcg administered IV. Continuous ECG, pulse oximetry and cardiopulmonary monitoring is performed throughout the entire procedure by the Interventional Radiology nurse. Total sedation time was 45 minutes.   ACCESS: A 7-French sheath left arm brachial axillary dialysis graft, antegrade direction.   CONTRAST USED: Isovue 26 mL.   FLUOROSCOPY TIME: 2.6 minutes.   INDICATIONS: [name] is a 34 year old woman maintained on hemodialysis via a left arm brachial axillary dialysis graft. She recently underwent intervention after significant problems were associated with her graft. Her Dialysis Center has contacted [place] saying that they are continuing to have problems. Noninvasive studies demonstrate a high-grade stenosis within the axillary vein. She is, therefore, undergoing angiography with the hope for intervention. The risks and benefits were reviewed. All questions were answered. The patient agrees to proceed.   DESCRIPTION OF PROCEDURE: The patient is taken to Special procedures and placed in the supine position with her left arm extended palm upward. They have been unable to achieve IV access in  the preoperative area.   One percent lidocaine is infiltrated in the soft tissues overlying the AV graft, and access  is obtained with a micropuncture needle, a microwire followed by micro sheath, a J-wire followed by a 6-French sheath. Subsequently, 2 mg of Versed and 50 mcg of fentanyl are administered through the sheath. Hand injection of contrast is then performed through the sheath, and there is verification of a high-grade stricture within the axillary vein approximately 2 cm from the venous anastomosis. Therefore, 3000 units of heparin is given. A Magic Torque Wire is used to negotiate this lesion, and an 8 x 4 balloon is inflated to 14 atmospheres across this stricture. Follow-up angiography demonstrates minimal improvement, inadequate result from the angioplasty; and it is, therefore, elected to extend the FLAIR stent that is already existing. Therefore, the 8 x 70 FLAIR stent is opened onto the field, and advanced over the wire, and deployed without difficulty. It is then post dilated with an 8 mm x 6 cm [place] balloon. Follow-up angiography demonstrates an excellent result with zero residual stenosis and rapid flow of contrast. Central veins are widely patent.   Pursestring suture of 4-0 Monocryl is placed. The 7-French sheath is removed, and there are no immediate complications.   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 The venous cannulation site appears good. Previously placed FLAIR stent is widely patent. However, approximately 2 cm more proximally, there is a focal high-grade stenosis. Following angioplasty, there is an inadequate result; and, therefore, the second FLAIR stent is deployed and postdilated to 8 with an excellent result.   SUMMARY: Successful salvage of left arm brachial axillary dialysis graft for axillary stricture.   ____________________________ Katha Cabal, MD ggs:cbb D: 07/16/2012 17:18:11 ET T: 07/16/2012 18:43:52 ET JOB#: [id number]  cc: Katha Cabal, MD, <Dictator> Maize Brittingham  Eloise Levels  MD ELECTRONICALLY SIGNED 07/22/2012 10:52

## 2015-03-15 NOTE — Consult Note (Signed)
PATIENT NAME:  Kelly [name], Kelly [name] MR#:  [id number] DATE OF BIRTH:  1981/01/17  DATE OF CONSULTATION:  10/06/2012  REFERRING PHYSICIAN:   CONSULTING PHYSICIAN:  Algernon Huxley, MD  REASON FOR CONSULTATION: Central line placement.   HISTORY OF PRESENT ILLNESS: This is a 34 year old mentally challenged individual with a history of end-stage renal disease who is admitted for hypertensive crisis and abdominal pain and vomiting. She needs IV medications including hypertensive drips and pain medications and needs central venous access as they cannot obtain peripheral venous access. Her access options are limited and that is why we are asked to see her as vascular surgeon.   PAST MEDICAL HISTORY:   1. Hypertension.  2. Diabetes.  3. Hyperlipidemia.  4. End-stage renal disease.  5. Cognitive impairment with poor intellectual functioning and history of some sort of brain injury.  6. Secondary hyperparathyroidism.   PAST SURGICAL HISTORY:  1. Cholecystectomy.  2. Dialysis access.   SOCIAL HISTORY: She apparently smokes cigarettes still. Used to use cocaine and alcohol but she is now a resident of a group home.   FAMILY HISTORY: Mother has diabetes.   ALLERGIES: None known.  MEDICATION LIST:  1. Tylenol as needed.  2. Topical cream to her feet as needed.  3. Norvasc 10 mg daily.  4. Aspirin 81 mg daily.  5. Cyanocobalamin 1000 mcg at bedtime.  6. Diazepam as needed before hemodialysis for anxiety.  7. Divalproex sodium 1000 mg daily.  8. Colace 100 mg at bedtime.  9. Folic acid 1 mg b.i.d.  10. Glucagon for low blood sugars as needed.  11. Sliding scale insulin.  12. Hydralazine 25 mg t.i.d. and p.r.n.  13. Losartan 100 mg daily.  14. Lopressor 100 mg daily.  15. Nephro-Vite daily.  16. Norethindrone 0.35 mg daily.  17. Quetiapine 300 mg at bedtime.  18. MiraLax as needed.  19. Trazodone 100 mg at bedtime.  20. Sensipar 60 mg daily.   REVIEW OF SYSTEMS: Really not obtainable due to  her mental functioning and difficult to discern.   PHYSICAL EXAMINATION:  GENERAL: This is a well-developed, well-nourished African American female in the Emergency Room, not in obvious distress.   VITAL SIGNS: Temperature 98.6, pulse 80, blood pressure 199/108 currently which is down from 245/118 earlier.   HEENT: Normocephalic, atraumatic.   EYES: Sclerae anicteric. Conjunctivae are clear.   EARS: Normal external appearance. Hearing is intact.   NECK: Neck is supple without adenopathy or jugular venous distention.   EXTREMITIES: Showed mild lower extremity edema.   NEUROLOGIC: Poor cognitive functioning. Poor insight. No focal arm or leg weakness.   SKIN: Warm and dry.   ASSESSMENT AND PLAN: This is a 34 year old African American female who needs a central line. This will be placed at the bedside.   This is a level-3 consultation.  ____________________________ Algernon Huxley, MD jsd:cms D: 10/08/2012 10:26:00 ET T: 10/08/2012 10:40:07 ET JOB#: [id number]  cc: Algernon Huxley, MD, <Dictator> Algernon Huxley MD ELECTRONICALLY SIGNED 10/13/2012 9:04

## 2015-03-15 NOTE — Consult Note (Signed)
Brief Consult Note: Diagnosis: Schizoaffective disorder, mild MR.   Patient was seen by consultant.   Consult note dictated.   Recommend further assessment or treatment.   Orders entered.   Discussed with Attending MD.   Comments: Ms. Heath is well known to [place]. She is an incompetent adult and has a guardian Ms. Earley Brooke phone [phone] who will provide consent for treatment. Chrys feels well today and is thrilled to be discharged to home. She believes she is going to stay with her mother. I do not think this is the case. She will return to [facility].   MSE: Pleasant, polite, cooprative, No sefety issues, no psychosis.  PLAN: 1. Please continue all medications as prescribed at [facility], Trazodone 100 mg, Seroquel 300 mg, Depakote 1000 mg at night.  2. I will follow along.  Electronic Signatures: Orson Slick (MD)  (Signed [phone] 20:01)  Authored: Brief Consult Note   Last Updated: 13-Nov-13 20:01 by Orson Slick (MD)

## 2015-03-15 NOTE — Consult Note (Signed)
Brief Consult Note: Diagnosis: Schizoaffective disorder, mild MR.   Patient was seen by consultant.   Consult note dictated.   Recommend further assessment or treatment.   Orders entered.   Discussed with Attending MD.   Comments: Kelly [name] is well known to [place]. She is an incompetent adult and has a guardian Kelly [name] phone [phone] who will provide consent for treatment. Kelly [name] herself complains of abdominal pain. She has been vomitting since last night. She had two loose BM in the ER. She does want to get better and agred to work with her doctors. She seems to understand that her guardian will decide.   PLAN: 1. Please continue all medications as prescribed at [facility].   2. I will follow along.  Electronic Signatures: Orson Slick (MD)  (Signed 11-Nov-13 16:34)  Authored: Brief Consult Note   Last Updated: 11-Nov-13 16:34 by Orson Slick (MD)

## 2015-03-15 NOTE — Consult Note (Signed)
ESRD patient with malignant HTN and abdominal pain.  Needs IV medications and has no access.  Will place central line at bedside.  Electronic Signatures: Algernon Huxley (MD)  (Signed on 11-Nov-13 18:02)  Authored  Last Updated: 11-Nov-13 18:02 by Algernon Huxley (MD)

## 2015-03-15 NOTE — Op Note (Signed)
PATIENT NAME:  Kelly [name], Kelly [name] MR#:  [id number] DATE OF BIRTH:  10-15-1981  DATE OF PROCEDURE:  10/06/2012  PREOPERATIVE DIAGNOSES:  1. Hypertensive crisis.  2. End-stage renal disease with multiple previous dialysis catheters and limited venous access.  3. Abdominal pain.  POSTOPERATIVE DIAGNOSES:  1. Hypertensive crisis.  2. End-stage renal disease with multiple previous dialysis catheters and limited venous access.  3. Abdominal pain.  PROCEDURES PERFORMED: 1. Ultrasound guidance for vascular access to left femoral vein.  2. Placement of left femoral venous triple lumen catheter.   SURGEON: Algernon Huxley, M.D.   ANESTHESIA: Local.   ESTIMATED BLOOD LOSS: Minimal.   INDICATION FOR PROCEDURE: This is a 34 year old African American female with end-stage renal disease and limited venous access. We are asked to place a central. She has had multiple previous jugular PermCaths with infection and central venous issues are likely and so we planned a femoral line.   DESCRIPTION OF PROCEDURE: Initially the patient's right groin was sterilely prepped and draped and a sterile surgical field was created. The right femoral vein was not visualized and was not seen to be patent and was unable to be accessed blindly. After a short amount of time in this effort, I turned my attention the left femoral vein. This area was sterilely prepped and draped and the left femoral vein was widely patent and accessed without difficulty with a Seldinger needle.     A J-wire was placed. After skin nick and dilatation, the triple lumen catheter was placed over the wire and the wire was removed. All three lumens withdrew dark red, nonpulsatile blood and flushed easily with sterile saline and it was secured at 20 cm with three silk sutures. ____________________________ Algernon Huxley, MD jsd:slb D: 10/08/2012 10:22:00 ET T: 10/08/2012 10:47:30 ET JOB#: [id number]  cc: Algernon Huxley, MD, <Dictator> Algernon Huxley  MD ELECTRONICALLY SIGNED 10/13/2012 9:04

## 2015-03-15 NOTE — H&P (Signed)
PATIENT NAME:  Kelly [name], Kelly [name] MR#:  [id number] DATE OF BIRTH:  06-16-1981  DATE OF ADMISSION:  10/06/2012  ADDENDUM  DRUG ALLERGIES: No known drug allergies.   MEDICATIONS: 1. Absorbase topical cream apply to body once a day, apply to feet two times a day.  2. Tylenol 650 mg p.o. twice a day p.r.n.  3. Norvasc 10 mg p.o. daily.  4. Aspirin 81 mg p.o. daily.  5. Cyanocobalamin  1000 mcg p.o. once a day at bedtime.  6. Diazepam 5 mg p.o. 2 tablets three times daily on Monday, Wednesday, and Friday before hemodialysis. 7. Divalproex sodium 500 mg p.o. tablets 2 tablets once a day at bedtime.  8. Colace 100 mg p.o. at bedtime.  9. Folic acid 1 mg 2 tablets once a day at bedtime.  10. Glucagon emergency kit for low blood sugar 1 mL injection twice a day after meals within 15 minutes of completion of breakfast and supper p.r.n. if blood sugar is less than 40 and the patient is unable to swallow.  11. Sliding scale.  12. Hydralazine 25 mg p.o. three times daily p.r.n. if blood pressure more than 175. 13. Hydralazine 25 mg p.o. 3 tablets twice a day.  14. Losartan 100 mg p.o. daily.  15. Lopressor 100 mg p.o. once daily.  16. Nephro-Vite RX p.o. tablet one tablet at bedtime. 17. Norethindrone 0.35 mg p.o. tablets once a day.  18. Polyethylene glycol 3350 oral powder for reconstitution, 17 grams p.o. once a daily p.r.n. for constipation.  19. Quetiapine 300 mg p.o. at bedtime.       20. Sensipar 30 mg 2 tablets once a day with supper.  21. Sodium hydrochloride 0.5% topical solution topically to affected at bedtime to soak feet. 22. Trazodone 100 mg p.o. at bedtime  ____________________________ Demetrios Loll, MD qc:slb D: 10/06/2012 15:23:06 ET T: 10/06/2012 16:10:27 ET JOB#: [id number]  cc: Demetrios Loll, MD, <Dictator> Demetrios Loll MD ELECTRONICALLY SIGNED 10/07/2012 16:42

## 2015-03-15 NOTE — Discharge Summary (Signed)
PATIENT NAME:  Kelly [name], Kelly [name] MR#:  [id number] DATE OF BIRTH:  04-05-1981  DATE OF ADMISSION:  10/06/2012 DATE OF DISCHARGE:  10/10/2012  ADMITTING PHYSICIAN: Demetrios Loll, MD     DISCHARGING PHYSICIAN: Gladstone Lighter, MD   PRIMARY CARE PHYSICIAN: The patient is from [place]:  1. Psychiatric consultation by Dr. Orson Slick..  2. Nephrology consultation by Dr. Anthonette Legato.   3. Vascular consultation for central line placement by Dr. Leotis Pain.   DISCHARGE DIAGNOSES:  1. End-stage renal disease, on Monday, Wednesday, Friday hemodialysis.  2. 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  3. Diabetes mellitus, borderline and diet-controlled only.  4. History of traumatic brain injury.  5. Secondary hyperparathyroidism.  6. Anemia of chronic disease.  7. Bipolar disorder with borderline intellectual function and cognitive impairment.   DISCHARGE MEDICATIONS:  1. Aspirin 81 mg p.o. daily.  2. Cyanocobalamin 1000 mcg p.o. daily.  3. Colace 100 mg p.o. daily.  4. Quetiapine 300 mg daily at bedtime.  5. Nephro-Vite 1 tablet daily at bedtime.  6. Glucagon Emergency Kit for low blood sugar, 1 mg injection twice a day if glucose is less than 40 and the patient is unable to swallow.  7. Absorbase Topical Cream, apply to body once a day liberally.  8. Sodium hypochlorite 0.5% topical solution to soak feet.  9. Tylenol 650 mg twice a day as needed for pain or headache.  10. Polyethylene glycol powder 17 grams as needed for constipation daily.  11. Norethindrone 0.35 mg daily.  12. Folic acid 2 mg daily.  13. Sensipar 60 mg p.o. daily with supper.  14. Trazodone 100 mg at bedtime.  15. Depakote 1000 mg at bedtime.  16. Diazepam 10 mg 3 times a week on Monday, Wednesday, Friday prior to dialysis.  17. Norvasc 10 mg p.o. daily.  18. Hydralazine 75 mg p.o. b.i.d.  19. Losartan 100 mg p.o. daily.  20. Toprol 100 mg p.o. daily.   21. Hydralazine 25 mg 3 times a day as needed for systolic blood pressure greater than 175.  22. Sliding scale insulin with Humalog, please give 1 unit subcutaneously twice a day 15 minutes after completion of breakfast and supper if the blood glucose is between 150 to 199 range; 2 units subcutaneously twice a day if blood sugar is between 200 to 249; 3 units subcutaneously twice a day for blood sugars between 250 to 299 range; and 4 units blood sugar between 300 to 349 range; and 5 units twice for blood sugar greater than 350.   DISCHARGE DIET: Renal diet.   DISCHARGE ACTIVITY: As tolerated.   FOLLOW-UP INSTRUCTIONS:  1. Primary care physician followup in 1 to 2 weeks.  2. Psychiatric followup as required.  3. Dialysis per schedule.   HOSPITAL LABORATORY, DIAGNOSTIC AND RADIOLOGICAL DATA: Blood cultures from 10/08/2012 are negative. Parathyroid hormone level is 454. WBC 7.0, hemoglobin 10.9, hematocrit 34.2, platelet count is 100. Sodium 138, potassium 4.6, chloride 101, bicarbonate 23, BUN 75, creatinine 12.6, glucose of 347, calcium of 8.1. Magnesium 2.5. Hemoglobin A1c is 5.7. CT of the abdomen and pelvis done on admission secondary to intractable nausea and vomiting showed thickening of multiple loops of small bowel in left hemi abdomen, nonspecific, but differential could include infectious, inflammatory ischemic enteritis. Mild amount  of ascites which is nonspecific is present.   BRIEF HOSPITAL COURSE: Ms. Vanleeuwen is a 34 year old African American female with past medical history significant for hypertension, diabetes, end-stage renal  disease on hemodialysis, with a history of bipolar disorder, borderline intellectual function, who is from [facility], was sent here to [facility] ED secondary to intractable nausea and vomiting and elevated blood pressures of 245/118. The patient has borderline intellectual function and cognitive impairment. She cannot make her own medical decisions as  she is incompetent, and her guardian is Ms. Earley Brooke, her phone number is [phone], who will provide consent for any procedures or treatment.   1. Intractable nausea and vomiting secondary to missing dialysis and uremia: The patient had a CT of the abdomen which did not reveal any abnormalities. Her symptoms resolved after she was dialyzed here in the hospital, and she is tolerating diet well and no nausea and vomiting at this time.  2. Malignant hypertension, again from missing dialysis: After dialyzing, blood pressure improved and she started back on her prior medications including Norvasc, hydralazine, losartan and metoprolol, and blood pressure is better controlled at this time.  3. End-stage renal disease, on hemodialysis: She is on Monday, Wednesday, Friday hemodialysis;  but again due to her mental illness she has refused dialysis a couple of times while in the hospital. She does get periorbital edema with elevated blood pressures whenever she misses her dialysis. She likes to have chocolate ice cream and watch cartoons. If you can encourage to do that, she will be okay for dialysis. She is also on Nephro-Vite and Sensipar and was seen by Nephrology while in the hospital.  4. Positive blood culture: There is a question of positive blood culture with staph aureus from an outpatient blood culture that was drawn, but it was reconfirmed that her cultures have been negative. She did get a dose of vancomycin when the initial results were back, but once her cultures were confirmed to be negative vancomycin was stopped, and her repeat cultures in the hospital are negative too.  5. Cognitive impairment with borderline intellectual function with bipolar disorder: She was seen by a psychiatrist in the hospital, and her medications of trazodone, Depakote and  quetiapine were continued; and the patient will be transferred to [place]:  FULL CODE.     DISCHARGE CONDITION:  Stable.   DISCHARGE DISPOSITION: To [place] ON DISCHARGE: 45 minutes.   ____________________________ Gladstone Lighter, MD rk:cbb D: 10/10/2012 12:02:41 ET T: 10/10/2012 12:26:22 ET JOB#: [id number]  cc: Gladstone Lighter, MD, <Dictator> [place] MD ELECTRONICALLY SIGNED 10/22/2012 18:47

## 2015-03-15 NOTE — Consult Note (Signed)
Brief Consult Note: Diagnosis: Schizoaffective disorder, mild MR.   Consult note dictated.   Recommend further assessment or treatment.   Comments: Mr. Buhay is more likely to agree to dialysis when she is offered a chocolate bar, gummy bears and cartoons during treatment. She does have diabetes.  Ms. Rafferty is well known to [place]. She is an incompetent adult and has a guardian Ms. Earley Brooke phone [phone] who will provide consent for treatment.   [place] could return to [facility] AFTER dialysis. She refused today. Ailene Rud, our discharge coordinator, spoke with her. See his note.    PLAN: 1. Please continue all medications as prescribed at [facility], Trazodone 100 mg, Seroquel 300 mg, Depakote 1000 mg at night.  2. I will follow along.  Electronic Signatures: Orson Slick (MD)  (Signed 14-Nov-13 21:00)  Authored: Brief Consult Note   Last Updated: 14-Nov-13 21:00 by Orson Slick (MD)

## 2015-03-15 NOTE — Consult Note (Signed)
Brief Consult Note: Diagnosis: Schizoaffective disorder, mild MR.   Patient was seen by consultant.   Consult note dictated.   Recommend further assessment or treatment.   Orders entered.   Discussed with Attending MD.   Comments: Kelly [name] is well known to [place]. She is an incompetent adult and has a guardian Kelly [name] phone [phone] who will provide consent for treatment. Kelly [name] herself complains of abdominal pain. She has been vomitting since last night. She had two loose BM in the ER. She does want to get better and agred to work with her doctors. She seems to understand that her guardian will decide.   Kelly [name] still complains of pain. She is very quiet. Does not answer the questions but nods in response.  PLAN: 1. Please continue all medications as prescribed at [facility], Trazodone 100 mg, Seroquel 300 mg, Depakote 1000 mg at night.  2. I will follow along..  Electronic Signatures: Orson Slick (MD)  (Signed [phone] 13:00)  Authored: Brief Consult Note   Last Updated: 12-Nov-13 13:00 by Orson Slick (MD)

## 2015-03-15 NOTE — Consult Note (Signed)
PATIENT NAME:  Kelly [name], Kelly [name] MR#:  [id number] DATE OF BIRTH:  16-Feb-1981  DATE OF CONSULTATION:  10/06/2012  REFERRING PHYSICIAN:  Demetrios Loll, MD CONSULTING PHYSICIAN:  Graycee Greeson B. Milena Liggett, MD  REASON FOR CONSULTATION: To evaluate a patient who refuses consent for treatment.   IDENTIFYING DATA: Kelly [name] is a 34 year old female with history of mood instability.   CHIEF COMPLAINT: "I'm in pain."  HISTORY OF PRESENT ILLNESS: Kelly [name] is well known to [place]. She has been frequenting our Emergency Room up until February of 2013 at which time she was accepted to [facility]. The patient is a difficult one because in addition to having serious mental illness, mental retardation, she also has kidney failure requiring dialysis. She has been behaving poorly during dialysis and was banned from some facilities. She has been residing at [facility] since February. She has been taking dialysis regularly. Today was her dialysis day, but the patient did not receive treatment as her blood pressure was elevated and she was directed to the nearest emergency room. The patient then refused cooperation with her doctors and refused any treatment. The patient is an incompetent adult and she has a guardian. In such cases, this is totally up to the guardian what kind of treatment the patient will receive and the guardian signs consent. The patient initially tells me that she is not interested in treatment, but after a short conversation she agrees to follow doctor's orders. She reports that she has been sick in her stomach with vomiting since last night. Today she also developed diarrhea and had two loose stools in the emergency room already. Her tummy is hurting, she has not eaten anything since last night, and feels tired, but she understands that she is in the hospital, doctors are here to help, and that her blood pressure has been dangerously high. She denies any symptoms of depression, anxiety, or  psychosis but is not very forthcoming with information. We obtained some of it from a staff member from [facility] who accompanied the patient to dialysis and now to our emergency room. I spoke with Dr. Bridgett Larsson who wants to admit this patient to the Critical Care Unit.   PAST PSYCHIATRIC HISTORY: The patient has been hospitalized before. She spent four months at [facility] in 2012. She has now been hospitalized for six months at [facility]. She does have a history of self distractive behaviors and suicide attempts. The patient denies any alcohol or illicit substance use.   FAMILY PSYCHIATRIC HISTORY: Unknown.   PAST MEDICAL HISTORY:  1. End-stage renal disease on dialysis Monday, Wednesday, and Friday. 2. Hypertension.  3. Dyslipidemia.  4. Diabetes. 5. Secondary hyperparathyroidism. 6. History of cholecystectomy. 7. Left A-V fistula.  8. 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 9. Cognitive impairment.   MEDICATIONS ON ADMISSION:  1. Trazodone 100 mg at night. 2. Sodium hydrochloride 0.5 topical. 3. Sensipar 30 mg two tablets daily. 4. Seroquel 200 mg daily.  5. Polyethylene glycol 3350 mg daily.  6. Norethindrone 0.35 mg daily.  7. Nephro-Vite daily. 8. Metoprolol 100 mg daily. 9. Losartan 100 mg daily.  10. Hydralazine 25 mg tablets take three twice daily. 11. Humalog. 12. Vitamin B12 1000 micrograms daily.  13. Aspirin 81 mg daily.  14. Amlodipine 10 mg daily.  15. Tylenol 325 mg as needed.   ALLERGIES: No known drug allergies.   SOCIAL HISTORY: The patient reportedly has a tenth grade education. She resides at [facility]  now. Her aunt who used to be her guardian passed away and the patient has a new guardian, information on chart.  REVIEW OF SYSTEMS: Difficult to obtain. The patient complains of nausea, abdominal pain, and diarrhea.   PHYSICAL EXAMINATION:   VITAL SIGNS: Blood pressure 199/108, initially 224/127,  pulse 80, and respirations 18.   GENERAL: The patient is an obese female in no acute distress. The rest of the physical examination is deferred to her primary attending.   LABORATORY DATA: Chemistries are within normal limits, except for blood glucose 188, BUN 62, and creatinine 11.02. Blood alcohol level is zero. LFTs are within normal limits, except for alkaline phosphatase of 176. Depakote level 48. CBC is within normal limits with low platelets of 134.   MENTAL STATUS EXAMINATION: The patient is alert and oriented to person, place, time, and situation. She is marginally cooperative but also in pain. She is wearing hospital scrubs. She maintains very limited eye contact. Her speech is soft. There is poverty of speech. Mood is depressed with flat affect. Thought process is logical but slow. She denies suicidal or homicidal ideation, delusions, or paranoia. There are no auditory or visual hallucinations. Her cognition is grossly intact. Her insight and judgment are questionable.   SUICIDE RISK ASSESSMENT: This is a patient with long history of depression, mood instability, and medical problems who was admitted to the hospital for dangerous elevation in blood pressure.   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   AXIS II: Borderline intellectual functioning.   AXIS III:  1. End-stage renal disease.  2. Diabetes. 3. Hypertension.  4. Dyslipidemia.   AXIS IV: Mental and physical illness, extended hospitalization, and primary support.   AXIS V: GAF on admission 35.   PLAN:  1. Please continue all medications as prescribed at [facility].  2. The patient seems agreeable to treatment. Please call me if troubles. ____________________________ Wardell Honour Bary Leriche, MD jbp:slb D: 10/06/2012 19:39:12 ET T: 10/07/2012 09:53:46 ET JOB#: [id number]  cc: Olga Bourbeau B. Bary Leriche, MD, <Dictator>  Clovis Fredrickson MD ELECTRONICALLY SIGNED  10/07/2012 19:52

## 2015-03-18 NOTE — Consult Note (Signed)
PATIENT NAME:  Kelly [name], Kelly [name] MR#:  [id number] DATE OF BIRTH:  [birth year]  DATE OF CONSULTATION:  12/29/2012  REFERRING PHYSICIAN:  Epifanio Lesches, MD CONSULTING PHYSICIAN:  Heinz Knuckles. Dazia Lippold, MD  REASON FOR CONSULTATION: Enterococcal bacteremia.   HISTORY OF PRESENT ILLNESS: The patient is a 34 year old female with a past history significant for end-stage renal disease, on hemodialysis, diabetes and bipolar disorder who was admitted on 01/31 with fever. Apparently the patient was seen in the Emergency Room the day prior to admission with a temperature of 102, but she had blood cultures drawn at that time that ultimately grew enterococcus. She was brought back to the hospital and was started on vancomycin. The patient while arousable is not communicating and appears to be feigning sleep. She was unable to provide any history. History is obtained exclusively from the chart. She had a recent AV fistula placed and was still receiving dialysis via a catheter in the left chest. This was removed on admission.   ALLERGIES: No known drug allergies.   PAST MEDICAL HISTORY: 1. End-stage renal disease on hemodialysis.  2. Hypertension.  3. Diabetes.  4. Schizoaffective disorder.  5. Mental retardation.  6. Bipolar disorder.  7. Hypercholesterolemia.  8. Status post AV fistula placement.  9. Cholecystectomy.   SOCIAL HISTORY: The patient lives in a facility She has a history of smoking and cocaine abuse. No further history was able to be obtained.   FAMILY HISTORY: Positive for diabetes.  REVIEW OF SYSTEMS: Unable to obtain as the patient is not compliant with the history taking process.   PHYSICAL EXAMINATION: VITAL SIGNS: T-max 103.1, T-current 98.4, pulse 110, blood pressure 170/73 and 96% on room air.  GENERAL: A 34 year old black female in no acute distress appearing asleep but occasionally arousable.  HEENT: Normocephalic, atraumatic. Pupils equal and reactive to light. Extraocular  motion appears to be intact but difficult to assess due to patient compliance. Sclerae, conjunctivae and lids are without evidence for emboli or petechiae. Oropharynx unable to examine due to the patient's compliance.  NECK: Midline trachea. No lymphadenopathy. No thyromegaly.  LUNGS: Clear to auscultation bilaterally with good air movement. No focal consolidation.  CARDIAC: Regular rate and rhythm without rub or gallop. She had a V/VI systolic murmur heard throughout the pericardium.  ABDOMEN: Soft, nontender and nondistended. No hepatosplenomegaly. No hernia is noted.  EXTREMITIES: No evidence for tenosynovitis.  SKIN: No rashes. No stigmata of endocarditis specifically no Janeway lesions or Osler nodes.  NEUROLOGIC: The patient appeared to be feigning sleep and would not follow commands or interact verbally.  PSYCHIATRIC: Mood and affect were difficult to assess due to her lack of compliance.   LABS/RADIOLOGIC STUDIES:  BUN 34, creatinine 7.99, bicarbonate 29 and anion gap 10. LFTs showed an AST of 87, ALT of 43, alkaline phosphatase 121 and total bilirubin 0.4. White count was 9.1 with a hemoglobin of 10.3, platelet count  of 122 and ANC of 5.4. White count was 9.1, on January 31st.   Blood cultures from January 30th, in the Emergency Room, are growing enterococcus in 2 of 4 bottles.   Rapid influenza testing on that day was negative.   Urinalysis was negative, except for greater than 500 protein, her nitrite and leukocyte esterase were negative and there were 14 red cells and 5 white cells per high-power field.   A chest x-ray showed mild increased interstitial markings, but no focal pneumonia.   IMPRESSION: The patient is a 34 year old female with a history of diabetes and  end-stage renal disease on hemodialysis admitted with enterococcal bacteremia.   RECOMMENDATIONS: 1. She would not wake up during the evaluation. She did not appear to be sedated, but just noncooperative. Her blood  cultures are positive for enterococcus. Her hemodialysis catheter has been removed.  2. She is currently on vancomycin. While her isolate is sensitive to ampicillin dosing for hemodialysis would be difficult with a beta lactam and she would be at risk for noncompliance with any oral therapy. We will continue the vancomycin for her current therapy.  3. She has significant murmur. Unclear if this is new. We will get an echocardiogram. If her TTE is negative, would not necessarily get a TEE unless her blood cultures do not clear as she has a removable focus.  4. Would recheck the blood cultures to document clearance.  5. The length of therapy will be based on repeat blood cultures and echocardiogram.  6. We will DC the Tamiflu. Bacteremia is a sufficient explanation for her presenting fever.  This is a moderately complex infectious disease case. Thank you very much for involving me in Ms. Linney's care.  ____________________________ Heinz Knuckles. Abegail Kloeppel, MD meb:sb D: 12/29/2012 17:03:25 ET T: 12/30/2012 07:08:44 ET JOB#: [id number]  cc: Heinz Knuckles. Mairen Wallenstein, MD, <Dictator> Clint Strupp E Sanaya Gwilliam MD ELECTRONICALLY SIGNED 12/30/2012 14:40

## 2015-03-18 NOTE — Consult Note (Signed)
Details:    - Psychiatry: Came by to followup with this patient who has mood instability and mental retardation who is currently in the hospital for sepsis. When I came by the patient's room today she was not there. I assume that she is either off at dialysis or a test of some sort. Nothing about my recommendations is changed. Will continue to followup and will try to come by tomorrow   Electronic Signatures: Gonzella Lex (MD)  (Signed 01-Feb-14 16:32)  Authored: Details   Last Updated: 01-Feb-14 16:32 by Gonzella Lex (MD)

## 2015-03-18 NOTE — Discharge Summary (Signed)
PATIENT NAME:  REE, ALCALDE MR#:  [id number] DATE OF BIRTH:  05-15-1981  DATE OF ADMISSION:  04/09/2013 DATE OF DISCHARGE:    The patient will be going back to [place] under the care of Dr. Johnnye Sima.   FINAL DIAGNOSES: 1.  Pneumonia.  2.  Anemia of chronic disease requiring 1 unit of blood.  3.  End-stage renal disease on hemodialysis.  4.  Bipolar schizoaffective disorder.   MEDICATIONS ON DISCHARGE: Include: Trazodone 100 mg p.o. daily at bedtime, clonidine 0.1 mg twice a day, Depakote 1000 mg at bedtime vitamin B complex 100, 1 tablet at bedtime, Cinacalcet mg a daily, vitamin B12 1000 mcg at bedtime, Colace 100 mg at bedtime, folic acid 2 mg at bedtime, Glucagon Emergency Kit for low sugar Recombinate 1 mg injectable powder for injection IM as needed for blood glucose less than 40. The patient is on insulin, lispro 3 times a day prior to meals sliding scale, losartan 100 mg bid daily, metoprolol 100 mg at bedtime extended-release,  MiraLAX 17 grams one packet every 24 hours as needed for constipation, Seroquel 150 mg extended-release at bedtime, amlodipine 10 mg daily at bedtime, hydralazine 100 mg 3 times a day, medroxyprogesterone 150 mg/mL IM injection every 3 months, Zoloft 100 mg daily, Valium 10 mg 3 times a week on Monday, Wednesday, Friday one hour prior to dialysis to reduce agitation, Levaquin 250 mg daily for 7 days then stop, calcium acetate 667 mg 1 capsule 3 times a day with meals.   DIET: Low-sodium diet, regular consistency.   ACTIVITY: As tolerated.   REFERRAL: [facility] on [place] Monday, Wednesday, Friday 1 to 2 days with Dr. Johnnye Sima [facility].   HOSPITAL COURSE: The patient was admitted 04/09/2013, discharged 04/12/2013. The patient was admitted with a fever.   HISTORY OF PRESENT ILLNESS: The patient is a 34 year old female with end-stage renal disease, bipolar disorder. At [facility], the patient had a fever of  102, generalized malaise, encephalopathy, transferred to the hospital. The patient was found to have pneumonia. Blood cultures were sent off, a sputum culture ordered. Initially, aggressive antibiotics with vancomycin and Zithromax and Zosyn were ordered. The patient had malignant hypertension, p.r.n. IV medications was used for end-stage renal disease, nephrology, Dr. Juleen China was consulted for dialysis while here.   LABORATORY AND RADIOLOGICAL DATA DURING THE HOSPITAL COURSE: Included a glucose 128, BUN 18, creatinine 6.97, sodium 139, potassium 4.5, chloride 102, CO2 of 32, calcium 9.3.   Liver function tests: ALT 8, AST 12, albumin 2.9.   White blood cell count 11.1, hemoglobin and hematocrit at 8.1 and 24.5, platelet count 168. Blood cultures negative. There is a wound culture that grew out coagulase-negative Staphylococcus, which is probably a contaminant from skin.   Chest x-ray shows increased perihilar lung markings bilaterally, density in the mid left lung may reflect pneumonia.   EKG showed a normal sinus rhythm, left ventricular hypertrophy, QTc measured at 400. Last white count 5.5, last hemoglobin 6.9, but the patient was transfused on that. She refused further hemoglobin.   HOSPITAL COURSE PER PROBLEM LIST:  1.  For the patient's pneumonia. The patient had slightly elevated white count on presentation. Here in the hospital, the patient did not have a high fever. Blood cultures were negative. The patient was initially started on aggressive antibiotics and she is a dialysis patient and has to be treated for healthcare associated pneumonia. The patient was given vancomycin, Zosyn and Zithromax. Upon discharge, the patient will  be switched over to Levaquin since cultures are negative and complete another 7 days. The patient did complain of some hemoptysis. This was not seen by the nurse, not seen by anybody hear. This can happen with pneumonia. I will hold the patient's aspirin at this point  and heparin subcutaneous off course was stopped. Can consider restarting aspirin in 1 week.  2.  Anemia of chronic disease. The patient can be given Procrit with dialysis. The patient was transfused 1 unit of packed red blood cells while she has been here. That was done on a hemoglobin of 6.9. A repeat hemoglobin was refused by the patient. I do recommend checking a repeat hemoglobin at the dialysis center.  3.  End-stage renal disease on hemodialysis dialysis. Dialysis will be continued as outpatient and [facility] Monday, Wednesday, Friday.  4.  Bipolar and schizoaffective disorder. No changes in the psychiatric medications were made at this time. Further treatment as per Dr. Johnnye Sima at [facility].  5.  Malignant hypertension on presentation. I think this is probably secondary to the patient's refusal take some medications. Continue current medications and go from there.  6. History of diabetes.  She is only on sliding scale at this point.   TIME SPENT ON DISCHARGE: 35 minutes.   ____________________________ Tana Conch. Leslye Peer, MD rjw:cc D: 04/12/2013 11:04:12 ET T: 04/12/2013 12:09:11 ET JOB#: [id number]  cc: Tana Conch. Leslye Peer, MD, <Dictator> [place] on [id number] Fax report:  [phone] Marisue Brooklyn MD ELECTRONICALLY SIGNED 04/17/2013 13:27

## 2015-03-18 NOTE — Consult Note (Signed)
Brief Consult Note: Diagnosis: mood disorder nos' mental retardation.   Patient was seen by consultant.   Consult note dictated.   Recommend further assessment or treatment.   Orders entered.   Comments: Psychiatry: Patient seen. Chart reviewed. Patient with mental retardation and chronic mental illness with end stage renal failure on dialysis. Patient should be continued with her usual meds of seroquel 300mg  qhs and trazodone qhs and depakote 1000mg  qhs and valium 10mg  prior to dialysis. Her guardian is Earley Brooke at [phone]. Patient is incompetant and can not refuse treatment against guardian consent.  Electronic Signatures: Gonzella Lex (MD)  (Signed 31-Jan-14 15:25)  Authored: Brief Consult Note   Last Updated: 31-Jan-14 15:25 by Gonzella Lex (MD)

## 2015-03-18 NOTE — H&P (Signed)
PATIENT NAME:  Kelly [name], Kelly [name] MR#:  [id number] DATE OF BIRTH:  03-12-1981  DATE OF ADMISSION:  12/26/2012  CHIEF COMPLAINT: Fevers.   HISTORY OF PRESENT ILLNESS: The patient is a 34 year old female with history of hypertension, diabetes, a history of schizoaffective disorder, mild mental retardation. She has been at [facility] for the past one year brought in by the staff because of fever. The patient was seen in the Emergency Room yesterday for fever and her Perm-A-Cath on the left chest was removed and she was sent home so she was sent to the [facility] but today she was brought in again because of the persistent fever up to [place]. The patient did not have any nausea or vomiting, no abdominal pain, no cough, no trouble breathing, and in the ER the patient's flu test was negative but blood cultures from yesterday showed gram-positive cocci in both aerobic and anaerobic bottles so we are admitting for sepsis with gram-positive bacteremia. The patient received Ativan because of the agitation and right now she is sedated and unable to give any history. The patient also received 150 mL of IV fluids.   PAST MEDICAL HISTORY: Significant for a history of hypertension, diabetes, schizoaffective disorder, mild mental retardation, ESRD on hemodialysis. The patient's last dialysis was on Wednesday but refused dialysis this morning. The patient was seen by Dr. Bary Leriche in November. At that time the patient was seen by her and the patient was given trazodone, Seroquel, Depakote, and the patient has a guardian, Mertie Moores, who gives the consent for the treatment. The patient is an incompetent adult according to Dr. Bary Leriche. Here the patient right now is sedated with Ativan and Haldol because o agitation  and psychosis. Past medical history also includes hyperlipidemia, secondary hyperparathyroidism, ESRD on hemodialysis Monday, Wednesday, Friday. The patient had recent an AV fistula in  the right arm at this time which was placed on January 28th by Dr. Delana Meyer and a left IJ catheter was removed on the same day.   PAST SURGICAL HISTORY: Cholecystectomy and AV fistula.   SOCIAL HISTORY: The patient has a history of tobacco abuse and also a history of cocaine abuse.   FAMILY HISTORY: Mother had diabetes.   REVIEW OF SYSTEMS: Unable to obtain at this time because she is sedated.    PHYSICAL EXAMINATION:  VITAL SIGNS: Temperature 101.7, pulse 110, respirations 20, blood pressure 156/92, sats 95% on room air.  GENERAL: The patient is sedated, able to questions in between.  HEENT: Atraumatic, normocephalic  NECK: Supple. No JVD. No thyroid enlargement.  CARDIOVASCULAR: S1 and S2 regular, tachycardic. No murmurs. No gallops.  PULMONARY: The patient has faint crackles in the bases. Not using accessory muscles of respiration.  ABDOMEN: Soft. Bowel sounds present, obese. No organomegaly.  EXTREMITIES: No cyanosis. No clubbing, has a fistula in the right and slightly tender on the left chest wall where the dialysis catheter was removed.  NEUROLOGIC: The patient is sedated but no gross focal neurological deficit observed.   LAB DATA: WBC 9.1, hemoglobin 10.3, hematocrit 30.9, platelets 105.   ELECTROLYTES: Sodium 133, potassium 6.1, chloride 99, bicarb 26, BUN 58, creatinine 11, glucose 248. LFTs within normal limits.   The patient's chest x-ray that was done yesterday shows mildly increased interstitial marking consistent with some interstitial edema. The patient's influenza titers done yesterday were negative. Blood cultures done from yesterday showed gram-positive cocci in aerobic and anaerobic bottles. Full culture report is pending.   ASSESSMENT AND PLAN: A  34 year old female patient with:  1. Sepsis secondary to gram-positive bacteremia probably secondary to infected dialysis catheter, which was already removed from the left chest. The patient will be admitted to the  hospitalist service. She will be given vancomycin. We will follow the full blood culture report. 2. End-stage renal disease on hemodialysis. The patient did not get dialysis today and her potassium at 6.1. Spoke with Dr. Candiss Norse, recommends urgent dialysis today. We will speak to the patient's guardian, Tania, and get the consent for that.  3. Hypertension. The patient will be on her home medications including Norvasc, hydralazine, Toprol-XL and clonidine.  4. Bipolar disorder with intellectual disability and also cognitive impairment. The patient was seen by Dr. Bary Leriche before and she will be on  Ativan > 1 mg at bedtime and Trazodone 1 gram at bedtime and also diazepam 10 mg 3 times a week prior to dialysis.  5. Diabetes. Right now she is on sliding-scale with coverage only.  6. Will get psychiatric consult with Dr. Bary Leriche and Nephrology consult with Dr. Candiss Norse.  7. The patient will be admitted to telemetry.   TIME SPENT ON HISTORY AND PHYSICAL: About 50 minutes.   ____________________________ Epifanio Lesches, MD sk:jm D: 12/26/2012 14:09:59 ET T: 12/26/2012 15:49:05 ET JOB#: [id number]  cc: Epifanio Lesches, MD, <Dictator> Epifanio Lesches MD ELECTRONICALLY SIGNED 01/27/2013 7:46

## 2015-03-18 NOTE — Consult Note (Signed)
PATIENT NAME:  Kelly [name], Kelly [name] MR#:  [id number] DATE OF BIRTH:  [birth year]  DATE OF CONSULTATION:  12/26/2012  REFERRING PHYSICIAN:   CONSULTING PHYSICIAN:  Gonzella Lex, MD  IDENTIFYING INFORMATION AND REASON FOR CONSULTATION:  This is a 34 year old woman with a history of mental retardation and brain injury and mental health problems who was brought to the Emergency Room because of persistent fever.  Consult for assistance with her mental health problems.   HISTORY OF PRESENT ILLNESS:  Information obtained from the chart primarily as the patient is not verbally communicating with me.  The patient apparently was brought back to the Emergency Room because of persistent fever.  She has blood cultures that are growing positive bacteria.  The patient is receiving hemodialysis regularly.  Currently there does not seem to be any specific new psychiatric complaint.  There is no indication that she has been having dangerous or aggressive or violent behavior.  The patient herself does not communicate with me and does not make any complaint.  She has been maintained on a combination of Depakote, Seroquel and trazodone as well as Valium that is used before she gets hemodialysis.   PAST PSYCHIATRIC HISTORY:  The patient was first seen at our hospital about a year ago at which time she was living in the [place] area in a group home.  At that time she was engaging in bizarre and threatening behavior and at times refusing dialysis.  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  Has a history of being diagnosed with schizoaffective disorder.  Has benefitted evidently from antipsychotics and mood stabilizers.  She has a legal guardian who makes decisions for her.  Although she has refused dialysis in the past it is not clear that this has been through specific suicidal intent.  It does not appear that she has made any full-blown  suicidal attempts.  She does have a history of being at least threatening and menacing at times when she is angry to other people.  She was admitted to the psychiatry service here in November of 2013 and ultimately was discharged back to [facility].  She has been a resident at [facility] since the early part of this year.   PAST MEDICAL HISTORY:  The patient has end-stage renal failure and is on hemodialysis.  She has diabetes, high blood pressure, obesity and appears to probably have an infection at this time.   SOCIAL HISTORY:  The patient has a legal guardian who is named Earley Brooke whose phone number is [phone].  Legally this person makes all of the decisions on the patient's behalf because the patient has been deemed incompetent.   REVIEW OF SYSTEMS:  The patient did not make any verbal communication with me to endorse.   CURRENT MEDICATIONS:  She is on diazepam 10 mg as needed before each dialysis treatment, Depakote 1000 mg at night, Cozaar 50 mg a day, metoprolol ER 100 mg a day, quetiapine 300 mg at night, trazodone 100 mg at night.   ALLERGIES:  No known drug allergies.   MENTAL STATUS EXAMINATION: Obese woman, looks subdued and somewhat sick.  She made eye contact with me and nodded her head a couple of times, but did not speak.  Affect blunted.  Psychomotor activity limited.  The patient appears to be awake, unknown if she is oriented.  Unknown if she is having active psychotic symptoms.  She  was not aggressive or threatening or behaving in a self-injuring manner.   ASSESSMENT:  This is a 34 year old woman with mental retardation, brain injury, mental health problems who is getting hemodialysis and appears to have an infection.  She has been able to be maintained in recent months pretty stably on her current psychiatric medication.  Currently she is not threatening or agitated.  In the past she has at times impulsively refused dialysis, but it has  usually been possible to tempt her into it.  She seems to do better if she is given a candy bar in spite of the fact that she is diabetic or if she is allowed to watch what she wants to on television during dialysis.  The Valium also helps.  At this point there is no need for admission to the behavioral health unit of our hospital.  I will be happy to follow up if needed.   TREATMENT PLAN:  Continue current psychiatric medicine of quetiapine 300 mg at night, trazodone 100 mg at night, Depakote 1000 mg at night and the diazepam prior to treatment with dialysis.  The patient may do better if she is offered some kind of reward for going to dialysis.  Hopefully, she will be able to return to the mental health hospital soon once she is more medically stable.   DIAGNOSIS, PRINCIPLE AND PRIMARY:  AXIS I:   1.  Bipolar disorder, not otherwise specified versus schizoaffective disorder.  2.  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  AXIS III:  End-stage renal failure on dialysis, hypertension, diabetes, obesity, current infection.  AXIS IV:  Severe from lack of social support.  AXIS V:  Functioning at time of evaluation 20.      ____________________________ Gonzella Lex, MD jtc:ea D: 12/26/2012 18:50:38 ET T: 12/27/2012 03:41:23 ET JOB#: [id number]  cc: Gonzella Lex, MD, <Dictator> Gonzella Lex MD ELECTRONICALLY SIGNED 12/27/2012 13:30

## 2015-03-18 NOTE — Op Note (Signed)
PATIENT NAME:  Kelly [name], Kelly [name] MR#:  [id number] DATE OF BIRTH:  December 30, 1980  DATE OF PROCEDURE:  04/02/2013  PREOPERATIVE DIAGNOSES: 1.  End-stage renal disease.  2.  Poorly functioning right arm arteriovenous graft with prolonged bleeding.  3.  Severe hypertension.   POSTOPERATIVE DIAGNOSES: 1.  End-stage renal disease.  2.  Poorly functioning right arm arteriovenous graft with prolonged bleeding.  3.  Severe hypertension.   PROCEDURE:  1.  Ultrasound guidance for vascular access, right arm arteriovenous graft.  2.  Right upper extremity shuntogram and central venogram.  3.  Percutaneous transluminal angioplasty of venous anastomosis with a 7 mm conventional  high-pressure angioplasty balloon.   SURGEON: Algernon Huxley, MD   ANESTHESIA: Local with moderate conscious sedation.   ESTIMATED BLOOD LOSS: Approximately 25 mL.   CONTRAST USED: 20 mL Visipaque.   FLUOROSCOPY TIME:  Approximately 2 minutes.   INDICATION FOR PROCEDURE: A 34 year old African-American female well known to [place] for her dialysis access needs. She has a prolonged bleeding from right arm AV graft.   DESCRIPTION OF PROCEDURE: The patient was brought to the vascular and interventional radiology suite. The right upper extremity was sterilely prepped and draped, and a sterile surgical field was created. The graft was accessed without difficulty with a Micropuncture needle under direct ultrasound guidance.  A permanent image was recorded, and a Micropuncture wire and sheath were placed.  Imaging showed an approximately 80 to 90% stenosis at the venous anastomosis.  I then upsized to a 6-French, heparinized the patient, crossed the lesion without difficulty with a Magic torque wire and treated this lesion with a 7 mm diameter angioplasty balloon. We had to go to a high-pressure balloon to go to 22 atmospheres to break the waist.  With the balloon inflated, we were able to see the arterial anastomosis which was widely patent.  Following angioplasty, there was good flow with less than 20% residual stenosis, and I elected to terminate the procedure. The sheath was removed around a 4-0 Monocryl purse suture. Pressure was held and sterile dressing was placed. The patient tolerated the procedure well and was taken to the recovery room in stable condition.   ____________________________ Algernon Huxley, MD jsd:cb D: 04/02/2013 17:22:39 ET T: 04/02/2013 17:38:23 ET JOB#: [id number]  cc: Algernon Huxley, MD, <Dictator> Algernon Huxley MD ELECTRONICALLY SIGNED 04/13/2013 13:51

## 2015-03-18 NOTE — H&P (Signed)
PATIENT NAME:  Kelly [name], Kelly [name] MR#:  [id number] DATE OF BIRTH:  11-Feb-1981  DATE OF ADMISSION:  04/09/2013  PRIMARY CARE PHYSICIAN:  [facility], [place] COMPLAINT:  Fever.   HISTORY OF PRESENTING ILLNESS:  This is a 34 year old African American female patient with end stage renal disease and bipolar disorder, resident of [facility], presents to the Emergency Room, sent in for a fever. The patient has had fever of 102 and generalized malaise with encephalopathy and was transferred here. The patient had recent bacteremia and also had on 04/02/2013 a right upper extremity shuntogram and PTA of venous anastomosis. There was a concern for recurrent bacteremia and the patient was sent in to the ER. The patient does not contribute to history, is nonverbal; follows only some commands. Her chest x-ray has shown a left-sided pneumonia and the patient is being admitted to the hospitalist service for IV antibiotics along with further workup.   The patient was discharged from the hospital on 12/31/2012 after being treated for enterococcus bacteremia likely from the left internal PermCath.    I also discussed the case with the patient's caretaker from [facility] who mentioned that the patient does not have a sitter but seems to do well. Has good appetite, but tends to be nonverbal and confused when she is sick.   PAST MEDICAL HISTORY:  1.  End stage renal disease on hemodialysis with an AV fistula in the right upper extremity.  2.  Labial hypertension.  3.  Bipolar disorder and incompetent to make her own decisions.  4.  Hyperlipidemia.  5.  Secondary hyperparathyroidism.  6.  Anxiety.  7.  Enterococcus bacteremia in 12/2012.  8.  Diabetes mellitus.  9.  Traumatic brain injury.  10.  Anemia of chronic disease.  11.  Schizoaffective disorder.   SOCIAL HISTORY:  The patient is a resident of [facility]. Does not smoke. No alcohol. No illicit drugs.   CODE STATUS:   FULL CODE.   PAST SURGICAL HISTORY:  Cholecystectomy and AV fistula placement; had a recent shuntogram of right upper extremity AV fistula on 04/02/2013.   FAMILY HISTORY:  Mother had diabetes.   REVIEW OF SYSTEMS:  Unobtainable as the patient is nonverbal.   MEDICATIONS FROM [facility]:  Show: 1.  Aspirin 81 mg oral once a day.  3.  Cyanocobalamin 1000 mcg oral once a day.  4.  Diazepam 10 mg oral on days of dialysis, 1 hour prior. 5.  Docusate sodium 100 mg oral once a day.  6.  Folic acid 2 mg oral once a day.  7.  Losartan 100 mg oral once a day.  8.  Metoprolol 100 mg oral once a day.  9.  MiraLAX once a day as needed.  10.  Quetiapine 150 mg oral at bedtime.  11.  Vitamin B with vitamin C and folic acid 1 tablet once a day.  12.  Amlodipine 10 mg oral once a day. 13.  Clonidine 0.1 mg oral 2 times a day.  14.  Depakote ER 1000 mg oral once a day.  15.  Hydralazine 100 mg oral 3 times a day.  16.  Medroxyprogesterone injection once every 3 months.  17.  Sertraline 100 mg oral once a day.  18.  Trazodone 100 mg oral once a day at bedtime.   PHYSICAL EXAMINATION:  VITAL SIGNS:  Temperature of 99, pulse of 98, respirations 20, blood pressure 154/80, saturating 96% on room air. She did have a  fever of 102 at [facility].  GENERAL:  Obese, African American female. The patient lying in bed, confused, does not seem to be in any acute distress.  PSYCHIATRIC:  Drowsy, sleeping. Flat affect.  HEENT:  Atraumatic, normocephalic. Oral mucosa moist and pink. External ears and nose normal. No pallor. No icterus. Pupils are equal and reactive to light.  NECK:  Supple. No thyromegaly. No palpable lymph nodes. Scars from prior PermaCath and central lines. No bruit.  CARDIOVASCULAR:  S1 and S2, systolic murmur. Peripheral pulses 2+. No edema.  RESPIRATORY:  Normal work of breathing. Clear to auscultation on both sides.  GASTROINTESTINAL:  Soft abdomen, nontender. Bowel sounds present. No hepatosplenomegaly  palpable.  GENITOURINARY:  No CVA tenderness or bladder distention.  SKIN:  Warm and extremely dry. No ulcers or rash.  MUSCULOSKELETAL:  No joint swelling, redness, effusion of the large joints. Normal muscle tone.  NEUROLOGICAL:  Moves all 4 extremities symmetrically. Babinski's is downgoing on both sides. Withdraws to pain.   DIAGNOSTIC DATA:  Chest x-ray shows left-sided pneumonia.   Glucose 128, creatinine 6.97, BUN 18, sodium 139, potassium 4.5, albumin 2.9. AST, ALT, alkaline phosphatase normal. WBC 11.1, hemoglobin of 8.1, platelets of 168.   EKG shows normal sinus rhythm. No acute ST wave changes.   ASSESSMENT AND PLAN:  1.  Left-sided pneumonia in a patient from [facility]. We will start the patient on antibiotics for healthcare-acquired pneumonia with vancomycin, Zosyn and azithromycin. We will get blood cultures, sputum cultures. The patient is not getting any oxygen, does not have acute respiratory failure. Nebs p.r.n. Although, patient has pneumonia, I will have to watch out for any bacteremia as the patient had bacteremia with enterococcus in the recent past and recently had her arteriovenous fistula investigated with a shuntogram.  2.  Malignant hypertension. Presently, the patient's blood pressure is extremely high at 207/90. We will give her IV p.r.n. blood pressure medications to control this. Continue her oral medications. The patient does have history of noncompliance with medications.  3.  End-stage renal disease on hemodialysis. Discussed case with Dr. Juleen China of nephrology. We will consult nephrology for her dialysis needs.  4.  Bipolar disorder with schizoaffective symptoms. Continue her home medications. Use Ativan and Haldol p.r.n.  5.  Deep vein thrombosis prophylaxis with heparin subcutaneous.  CODE STATUS: FULL CODE.   TIME SPENT TODAY ON THIS CASE:  45 minutes.   ____________________________ Leia Alf Jackee Glasner, MD srs:jm D: 04/09/2013 15:19:00  ET T: 04/09/2013 15:40:47 ET JOB#: [id number]  cc: Mamie Levers, MD [place] R. Lundy Cozart, MD, <Dictator>   Neita Carp MD ELECTRONICALLY SIGNED 04/27/2013 23:26

## 2015-03-18 NOTE — Discharge Summary (Signed)
PATIENT NAME:  Kelly [name], LOJEWSKI MR#:  [id number] DATE OF BIRTH:  1981/04/23  DATE OF ADMISSION:  12/26/2012 DATE OF DISCHARGE:  12/31/2012   ADMITTING PHYSICIAN: Epifanio Lesches, MD  DISCHARGING PHYSICIAN: Gladstone Lighter, MD  PRIMARY CARE PHYSICIAN: At [place]:  1. Psych consultation by Dr. Weber Cooks. 2. Nephrology consultation by Dr. Murlean Iba and Dr. Anthonette Legato.   DISCHARGE DIAGNOSES:  1. Sepsis secondary to Enterococcus bacteremia, likely source was left internal Perma-Cath.  2. End-stage renal disease, currently on Tuesday, Thursday, Saturday hemodialysis.  3. Malignant hypertension and noncompliant to medications.  4. Diet-controlled diabetes.  5. History of traumatic brain injury.  6. Anemia of chronic disease. 7. Secondary Hyperparathyroidism.  8. Bipolar disorder and schizoaffective disorder with borderline intellectual function and cognitive impairment.   DISCHARGE MEDICATIONS:  1. Seroquel 300 mg at bedtime.  2. Losartan 100 mg p.o. daily, with holding parameters of systolic blood pressure less than [id number] or diastolic less than 60.  3. Trazodone 100 mg p.o. at bedtime.  4. Tylenol 650 mg 3 times a day as needed for pain or headache.  5. Lispro insulin sliding scale 2 times a day after meals. For sugars 150 to 199 give 1 unit, 200 to 249 give 2 units, 250 to 299 give 3 units, 300 to 349 give 4 units, greater than 350 give 5 units and call MD. 6. Toprol 100 mg p.o. daily, hold if pulse less than 60 or systolic blood pressure less than 110.  7. Clonidine 0.1 mg p.o. b.i.d., hold if systolic blood pressure less than 110 or pulse less than 60.  8. Amlodipine 10 mg p.o. daily.  9. Hydralazine 75 mg p.o. b.i.d. and hold if systolic BP less than [id number]. 10. Hydralazine 25 mg 3 times a day as needed for systolic blood pressure greater than 0000000 or diastolic blood pressure greater than 100.  11. Divalproex sodium 1000 mg p.o.  at bedtime.  12. Nephro-Vite 1 tablet p.o. at bedtime.  13. Absorbase topical cream apply to body and both feet twice a day as needed for dry skin.  14. Vitamin B complex 1 tablet p.o. daily.  15. Vancomycin 750 mg with 150 mL D5 IV solution to be administered during dialysis until 01/12/2013.   DISCHARGE DIET: Low sodium, low fat and ADA diet.   DISCHARGE ACTIVITY: As tolerated.    FOLLOWUP INSTRUCTIONS: The patient is being transferred to [place] STUDIES: Her echo Doppler done on 12/30/2012 showing normal LV with mild concentric left ventricular hypertrophy, ejection fraction greater than 55%, normal wall motion. Right-sided systolic function is normal. No evidence of atrial septal defect  and no valvular vegetations noted. Last blood cultures from 12/29/2012 are negative. Admission blood cultures from 12/25/2012 growing Enterococcus faecalis sensitive to both ampicillin and linezolid. PTH level is 619. WBC 9.1, hemoglobin 10.3, hematocrit 31.2, platelet count 122; this CBC is from 12/27/2012. From 12/27/2012, sodium 135, potassium 4.8, chloride 96, bicarbonate 29, BUN 34, creatinine 7.99, glucose 125, calcium of 8.9.   BRIEF HOSPITAL COURSE: The patient is a 34 year old African-American female with past medical history significant for schizoaffective and bipolar disorder with intellectual compromise and mild cognitive impairment. The patient also has end-stage renal disease, on hemodialysis, who was sent in from [facility] secondary to fever.   1. Sepsis. The patient was originally sent to the ER from [facility] for fever, and her Perma-Cath on the left side of the  chest was taken out by Dr. Delana Meyer. Then, she was sent back to [facility], but came back secondary to high-grade fevers on 12/25/2012. Her cultures done on admission were growing Enterococcus, and the patient was placed on vancomycin, as recommended by infectious disease specialist. The organism  is sensitive to ampicillin too, but due to ease of administration during dialysis and also her compliance issues with oral medications, ID has recommended giving her IV vancomycin for 2 weeks. She did have a heart murmur on exam, so transthoracic echo was done, which did not reveal any vegetation, and repeat cultures from 12/29/2012 have remained negative for 48 hours. So, a transesophageal echo is not recommended, and treatment for 2 weeks has been ordered for her Enterococcus bacteremia. The patient has remained afebrile and normal white count while in the hospital.  2. End-stage renal disease, on hemodialysis. She did have a left chest Perma-Cath that was taken out by Dr. Delana Meyer on 12/23/2012, but she already had a right arm AV fistula that was placed in December, which has matured, and currently, we are using that access for dialysis. The patient is currently getting on a Tuesday, Thursday, Saturday schedule here in the hospital, and further dialysis per nephrology recommendations. Her last dialysis was prior to discharge on 12/31/2012.  3. Schizoaffective, bipolar disorder with intellectual disability and also cognitive impairment. The patient was seen by psychiatrist here, and all her medications that she was taking prior to admission were being continued at this time. She has required a dose of Ativan as she has been refusing echo while in the hospital. Other than that, she had a sitter all the time while she was in the hospital and has been ambulating without any effort.  4. Malignant hypertension. The patient usually refusing her morning medications as she is still sleepy at the time, and then by afternoon, when her blood pressure is spiking up, she gets headache and has been requesting her medications back. Blood pressure otherwise has been stable except for these spikes that are getting better if she takes her medications. Her course has been otherwise uneventful in the hospital.   DISCHARGE  CONDITION: Stable.   DISCHARGE DISPOSITION: To [facility].   Her course and the discharge information have been discussed with Dr. Posey Pronto at [facility] by Dr. Tressia Miners.   TIME SPENT ON DISCHARGE: 40 minutes.    ____________________________ Gladstone Lighter, MD rk:OSi D: 12/31/2012 10:42:52 ET T: 12/31/2012 11:15:59 ET JOB#: [id number]  cc: Gladstone Lighter, MD, <Dictator> Gladstone Lighter MD ELECTRONICALLY SIGNED 12/31/2012 15:35

## 2015-03-18 NOTE — Consult Note (Signed)
Impression: 34yo female w/ h/o DM, ESRD on HD admitted with Enterococcal bacteremia.  She would not wake up during the evaluation.  She did not appear sedated, but just non cooperative.  Her BCx are positive for Enterococcus.  Her HD catheter has been removed.   She is currently on vancomycin.  While her isolate is sensitive to ampicillin, dosing for HD would be difficult with a B-lactam and she would be at risk for noncompliance with oral therapy.  Will continue vanco for therapy. She has a significant murmur.  Unclear if this is new.   Will get echo.  If her TTE is negative, would not necessarily get a TEE unless her blood cultures do not clear. Would recheck BCx to document clearance. Length of therapy will be based on repeat cultures and echo. d/c Tamiflu.  Bacteremia is a sufficient explanation for her presenting fevers.  Electronic Signatures: Rainy Rothman, Heinz Knuckles (MD)  (Signed on 03-Feb-14 16:55)  Authored  Last Updated: 03-Feb-14 16:55 by Alizia Greif, Heinz Knuckles (MD)

## 2015-03-18 NOTE — Op Note (Signed)
PATIENT NAME:  Kelly [name], Kelly [name] MR#:  [id number] DATE OF BIRTH:  02/07/1981  DATE OF PROCEDURE:  12/23/2012  PREOPERATIVE DIAGNOSES:  1.  Complication arteriovenous dialysis device with thrombosis of catheter.  2.  End-stage renal disease requiring hemodialysis.   POSTOPERATIVE DIAGNOSIS:  1.  Complication arteriovenous dialysis device with thrombosis of catheter.  2.  End-stage renal disease requiring hemodialysis.   PROCEDURE PERFORMED: Removal of left internal jugular cuffed tunneled dialysis catheter.   ANESTHESIA: Procedure performed under local anesthesia and 1% lidocaine with epinephrine.   ASSISTANT: Chelsea Lincoln Brigham.   SURGEON: Hortencia Pilar, MD.   DESCRIPTION OF PROCEDURE: The patient is positioned supine in the preoperative area of special procedures. The left neck and catheter are prepped and draped in sterile fashion. The cuff is located by palpation. Lidocaine 1% with epinephrine is infiltrated into the soft tissue surrounding the cuff and the cuff is freed from the surrounding tissues. The catheter is then extracted and pressure held at the base of the neck. Antibiotic ointment and a sterile dressing were applied. The patient tolerated the procedure well and there were no immediate complications.   ____________________________ Katha Cabal, MD ggs:aw D: 12/23/2012 10:05:40 ET T: 12/23/2012 12:21:48 ET JOB#: [id number]  cc: Katha Cabal, MD, <Dictator> Katha Cabal MD ELECTRONICALLY SIGNED 01/12/2013 23:24

## 2015-03-18 NOTE — Op Note (Signed)
PATIENT NAME:  Kelly [name], NASUTI MR#:  [id number] DATE OF BIRTH:  12-14-1980  DATE OF PROCEDURE:  11/12/2012  PREOPERATIVE DIAGNOSIS: End-stage renal disease, requiring hemodialysis.   POSTOPERATIVE DIAGNOSIS: End-stage renal disease, requiring hemodialysis.  PROCEDURE PERFORMED: Creation of a right arm brachial axillary dialysis graft.   SURGEON: Hortencia Pilar, MD   ANESTHESIA: MAC.   ESTIMATED BLOOD LOSS: Minimal.   SPECIMEN: None.   FLUIDS: Per anesthesia record.   INDICATIONS: The patient is a 34 year old woman maintained on hemodialysis via a left IJ catheter. Prior to that, she underwent excision of an infected brachial axillary graft on the left arm. She now returns for upper extremity access on the right. The risks and benefits were discussed with her guardian. All questions were answered. Guardian has given [place] permission to proceed.   PROCEDURE: The patient is taken to the operating room and placed in the supine position. After adequate general anesthesia is induced and appropriate invasive monitors are placed, she is positioned supine with her right arm extended palm upward, and right arm is prepped and draped in a sterile fashion. Appropriate timeout is called.   A linear incision is created overlying the brachial impulse just above the antecubital fossa, and a second linear incision is made in the anterior axillary space more proximally. Working to expose the artery first, dissection is carried down through the soft tissues, fascia is opened, vascular bundle is identified and the brachial artery is isolated and looped proximally and distally. In a similar fashion, the dissection is carried down, fascia is opened and the axillary vein is looped proximally and distally.   Gore tunneler is then passed from the arterial incision to the venous incision, and a 4 to 7 mm tapered PTFE Propaten graft is pulled subcutaneously. It is then beveled at the arterial end. The artery is delivered  into the surgical field, and hemostasis is obtained with Silastic vessel loops. Arteriotomy is made, extended with Potts scissors. The 6-0 Prolene stay sutures are placed. Graft is beveled, as noted, and subsequently an end graft to side artery anastomosis is fashioned with running CV-7 suture. Flushing maneuvers are performed, and flow is established back to the hand. Graft is also flushed and then irrigated copiously with heparinized saline and clamped with a vascular clamp just above the suture line. With the graft pressurized, it was checked for its proper lie and appropriate depth and then flushed as noted above. With the vein in its native bed, the graft is approximated down onto the vein, marked and then transected. The vein is delivered into the surgical field and hemostasis obtained with the Silastic vessel loops. Venotomy is then created, extended with Potts scissors. Stay sutures of 6-0 Prolene placed, and an end graft to side vein anastomosis is fashioned with running CV-6 suture. Flushing maneuvers are performed, and flow is established through the graft. Soft thrill is noted by palpation, 1+ radial pulse is maintained.   Both wounds are then irrigated copiously with sterile saline and closed in layers using 3-0 Vicryl followed by 4-0 Monocryl subcuticular for skin and then Dermabond. The patient tolerated the procedure well. There were no immediate complications. Sponge and needle counts are correct, and she is taken to the recovery area in excellent condition.   ____________________________ Katha Cabal, MD ggs:OSi D: 11/12/2012 16:35:15 ET T: 11/13/2012 08:28:32 ET JOB#: [id number]  cc: Katha Cabal, MD, <Dictator> Katha Cabal MD ELECTRONICALLY SIGNED 12/02/2012 11:16

## 2015-03-19 NOTE — Consult Note (Signed)
Psychiatry: Follow-up note for this 34 year old woman with mental retardation and schizoaffective disorder.  Not surprisingly, one she has started to get her medical problems treated she is a little more mentally appropriate.  Difficult and uncooperative is baseline for her but usually she ultimately will cooperate with treatment if she is treated nicely and coaxed a little bit.  Continue current medication.  Will continue to follow-up as needed.  Electronic Signatures: Gonzella Lex (MD)  (Signed on 10-Jul-15 22:43)  Authored  Last Updated: 10-Jul-15 22:43 by Gonzella Lex (MD)

## 2015-03-19 NOTE — Consult Note (Signed)
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 She was petitioned back into the hospital by her group home stating that she was threatening people and refusing medical treatment.   HISTORY OF PRESENT ILLNESS: The commitment paperwork says that she had made threats to hurt other people and that she was refusing to take medicine or engage herself in medical treatment. The patient says that all of that is "a bald-faced lie." She says she does not know why they sent her back here. She denies that she was having any kind of conflict with anyone at her group home. Denies that she was refusing treatment. She says her mood is fine. She denies any suicidal ideation whatsoever. She denies any auditory or visual hallucinations. No new complaints. She says she is feeling physically okay not having any acute pain or other acute physical symptoms.   PAST PSYCHIATRIC HISTORY: This patient has a history of chronic severe cognitive impairment. She functions as though she had at least moderate mental retardation. 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 She has been diagnosed with schizoaffective disorder, although the full details of that are kind of unclear in my working with her. I have not seen obvious other symptoms of schizoaffective disorder. Sometimes she will report hallucinations, but she has not done that recently since I have been seeing her and does not ever appear to be obviously delusional.   SOCIAL HISTORY: She is living at a group home. Seems to have very little involvement with family. She is legally incompetent and is not legally able to refuse any appropriate medical treatment. She has a legal guardian for  all of her decision-making.   PAST MEDICAL HISTORY: The patient is on hemodialysis. She had end-stage renal disease. She has had 2 lengthy hospitalizations recently, one for a septic infection of her old graft, and then another one right afterwards.   REVIEW OF SYSTEMS: She denies any suicidal or homicidal ideation, denies that she is having any hallucinations. Says she is not in any pain anywhere. Not feeling sick. No real medical problems at all complained of.   CURRENT MEDICATIONS: Clonidine 0.2 mg twice a day, Depakote 1000 mg at night, trazodone 100 mg at night, Lantus 6 units at night, medroxyprogesterone 150 mg/mL every 3 months, aspirin 81 mg a day, MiraLax p.r.n., Sensipar 30 mg once a day, folic acid 2 mg once a day, Norvasc 10 mg at night, quetiapine 300 mg twice a day, diazepam 10 mg p.r.n. to help her get dialysis, metoprolol 50 mg twice a day, PhosLo 667 mg 2 capsules twice a day, hydralazine 50 mg 3 times a day, Lexapro 15 mg a day, nystatin as needed.   ALLERGIES: No known drug allergies.   MENTAL STATUS EXAMINATION: The patient does not appear in any acute distress. She was cooperative with the interview. She made good eye contact. Given her usual baseline, she was pretty engaging. She actually spoke to me and answered questions. Speech was very decreased in total amount but that is normal for her. Somewhat flat affect. No hostility expressed. No suicidal or sad affect expressed. Denies suicidal or homicidal ideation. Denies any hallucinations. Denies any thoughts of harming anybody. I did not do full cognitive testing with  her. She is clearly having chronic cognitive impairment. She is oriented to being in the hospital in the basic situation.   LABORATORY DATA: The only thing that has been done again since she came in the hospital this time is her blood sugar, which is currently 260, not that unusual for her.   ASSESSMENT: A 34 year old woman with chronic cognitive impairment.  Multiple severe medical problems. She was just discharged from the hospital yesterday and is now returned with allegations that she is refusing treatment. She denies all of it. Not showing any acute symptoms of agitation, hostility, mood disorder, or psychosis. Really not clear why she needs to be here. Really not clear that she has acute illness at this point.   TREATMENT PLAN: I have discussed this with the Emergency Room doctor. We can get some further collateral information but she does not need psychiatric hospitalization at this point. Hopefully we can negotiate in the next day or less about getting her returned back home.   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   SECONDARY DIAGNOSES:  AXIS I: No further.   AXIS II: No diagnosis.   AXIS III: End-stage renal failure, hypertension, diabetes.   AXIS IV: Severe from chronic illness.   AXIS V: Functioning at time of evaluation 47.    ____________________________ Gonzella Lex, MD jtc:lt D: 06/30/2014 17:14:00 ET T: 06/30/2014 17:24:40 ET JOB#: [id number]  cc: Gonzella Lex, MD, <Dictator> Gonzella Lex MD ELECTRONICALLY SIGNED 07/22/2014 22:42

## 2015-03-19 NOTE — Consult Note (Signed)
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 Consultation because of refusing dialysis.   HISTORY OF PRESENT ILLNESS: Information obtained from the chart and the patient. She had recently been in the hospital for an extended stay that included infection in her dialysis shunt. She had only been home for a few days and is brought back by her group home with reports that she is refusing to get dialysis. The patient today states to me that the reason she refused to get dialysis is that she does not like getting dialysis. She says dialysis makes her feel tired and is boring. At the same time, she tells me that she understands that she will die if she does not get dialysis and says that she does not want to die and does not want to kill herself. She indicates that her mood has been feeling a little bit more down recently than usual. She says that she hears things and sees things but cannot be more specific except to say that they are scary. All of this makes it sound like she is a better historian than she really is; in fact, most information has to be coaxed out of her one bit at a time, and she rarely provides information without specific questions. She does not have any other complaints at this time. She does mention to me that her arm is "split open" and that you can see into it. I assume she is referring to the surgery on her infected shunt. It is covered with a bandage at this point. It does not sound like a new acute problem. She tells me that she has been taking her medication regularly.   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 Her functioning is similar to a person with moderate mental retardation. She has a history of also being diagnosed with schizoaffective disorder, but it is not clear how this is teased apart from her organic injury. She has been treated with mood stabilizers for impulsivity and mood swings. The patient has a history of stating that she will refuse dialysis. Usually she can be coaxed into it fairly easily with food or other rewards. No known history of actually trying to hurt herself or being violent to others. Her medications that she had recently been treated with included trazodone 100 mg at night, Zoloft 100 mg per day, Valium 5 mg 2 tablets just before dialysis, Seroquel 200 mg twice a day, Depakote 1000 mg at night. (Dictation Anomaly)<<despiteMISSING TEXT>> some minor variation on it; it has been stable for years. On her last hospital stay, she came into the hospital with complaints, also, that she was refusing dialysis, but there did not seem to have been any behavior problems with her once she was in the hospital and continued on her medicine.   SOCIAL HISTORY: It is important to understand that she is legally incompetent and has a legal guardian. She is legally unable to refuse dialysis. Dialysis can always be performed on her whether she states that she is willing to do it or not because she is incompetent to make medical decisions. The patient lives in a  group home, has minimal family contact. Her legal guardian is not a relative, I do not believe.   MEDICAL HISTORY: End-stage renal disease on hemodialysis. Recent sepsis with infection requiring new surgery. History of hypertension. History of hyperlipidemia. History of hyperparathyroidism. Recent bacteremia. Diabetes.   FAMILY HISTORY: None known.   SUBSTANCE ABUSE HISTORY: Past history of alcohol dependence before her injury, no substance abuse involved now.   REVIEW OF SYSTEMS: She says that her mood feels a  little bit bad and sad. She claims to be having hallucinations. Feels tired. Otherwise negative review of systems.   MENTAL STATUS EXAMINATION: Disheveled, adequately groomed, chronically ill-appearing woman interviewed in her hospital room. When I first came in to see her, she was lying across her bed sideways at a bizarre angle apparently just because she preferred to be that way. She initially would not engage in conversation with me but gradually warmed up a little bit. Eye contact was still only intermittent. Speech minimal, usually only 3 or 4 words at a time. Affect flat. Mood stated as okay. Thoughts very simple and limited. No obvious delusions. Claims she has hallucinations that are frightening to her. Denies suicidal or homicidal ideation or any wish to be dead. The patient is alert and oriented to being in the hospital and to the fact that she needs dialysis. Her short- and long-term memory are both impaired. Overall, cognitive and function clearly quite impaired.   LABORATORY RESULTS: Glucose elevated in the mid 100 to 200 range. Potassium is okay. Sodium is okay; a little bit low. CBC: Anemic. Hematocrit 21, hemoglobin 7.   ASSESSMENT: This is a 34 year old woman with a history of traumatic brain injury; chronic, severe intellectual impairment; chronic behavior problems. It was reported that she is refusing dialysis. As mentioned previously, she is legally unable to refuse dialysis. Contact needs to be made immediately with her legal guardian by social work to stay in contact with them. As far as her psychiatric illness, she is not a very reliable historian, and it is not clear to me whether there has been any real change from baseline. Nevertheless, I am suggesting that we try making some changes in her medication in case mood changes may have something to do with her re-presentation to the hospital. I am going to discontinue the Zoloft that she has been on for quite some time and replace it  with Lexapro 15 mg a day. I am also going to increase her Seroquel to 300 mg twice a day. I informed the patient of this and she had no complaint. We will follow as long as she is in the hospital.   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  AXIS II: Deferred.  AXIS III: Renal failure, hemodialysis, anemia, hypertension, diabetes.  AXIS IV: Severe, from her multiple illnesses and lack of support.  AXIS V: Functioning at time of evaluation, 36.   ____________________________ Gonzella Lex, MD jtc:sk D: 06/23/2014 23:52:55 ET T: 06/24/2014 02:09:31 ET JOB#: [id number]  cc: Gonzella Lex, MD, <Dictator> Gonzella Lex MD ELECTRONICALLY SIGNED 07/07/2014 0:41

## 2015-03-19 NOTE — Consult Note (Signed)
Brief Consult Note: Diagnosis: Schizoaffective disorder, mental retardation.   Patient was seen by consultant.   Consult note dictated.   Recommend further assessment or treatment.   Comments: Ms. Merced has a h/o schizoaffective disorder, mental retardation and ESRD refusing dialysis. She has been maintained on Seroquel 300 mg qhs, Trazodone 200 mg qhs, Depakote 1000 mg qhs and valium 10mg  prior to dialysis. Her guardian is Earley Brooke at [phone]. Patient is incompetant and can not refuse treatment against guardian consent. She has Actuary. She reportedly required sitters during her dialysis in the community.  She did take medications today and agrees to dialysis. There were no behavioral problems but the patient needs a lot of redirection. She denies symptoms of depression or suicidal/homicidal ideation. Appetite is good.  PLAN: 1. The patient no longer meets criteria for IVC. I will terminate proceedings. please discharge as appropriate. Continue sitter for dialysis.  2. Will continue all other psychotropic medications as in the community.  3. I will follow up as needed..  Electronic Signatures: Orson Slick (MD)  (Signed 29-Jul-15 15:33)  Authored: Brief Consult Note   Last Updated: 29-Jul-15 15:33 by Orson Slick (MD)

## 2015-03-19 NOTE — Discharge Summary (Signed)
PATIENT NAME:  Kelly [name], MOLINARO MR#:  [id number] DATE OF BIRTH:  05/12/1981  DATE OF ADMISSION:  06/22/2014 DATE OF DISCHARGE:  06/29/2014  ADMITTING PHYSICIAN: Albertine Patricia, MD.  DISCHARGING PHYSICIAN: Gladstone Lighter, MD.   PRIMARY NEPHROLOGIST:  Dr. Candiss Norse.   CONSULTATIONS IN THE HOSPITAL:  1.  Nephrology consultation, Dr. Candiss Norse and Dr. Holley Raring.  2.  Psychiatric consultation with Dr. Weber Cooks.   DISCHARGE DIAGNOSES:  1.  Hyperkalemia.  2.  End-stage renal disease, hemodialysis on Monday, Wednesday, Friday.  3.  Malignant hypertension.  4.  Diabetes mellitus.  5.  Mental retardation.  6.  Bipolar and schizoaffective disorder.  7.  Recent methicillin-sensitive Staphylococcus aureus sepsis from right arm graft infection.   DISCHARGE HOME MEDICATIONS:  1.  Clonidine 0.2 mg p.o. b.i.d.  2.  Divalproex 500 mg oral delayed release tablet, 2 tablets once a day at bedtime.  3.  Trazodone 100 mg p.o. at bedtime.  4.  Lantus 6 units subcutaneously at bedtime.  5.  Medroxyprogesterone 150 mg/mL, 1 mL intramuscular every 3 months.  6.  Aspirin 81 mg p.o. daily.  7.  MiraLax powder p.r.n. for constipation.  8.  Sensipar 30 mg p.o. daily.  9.  Folic acid 2 mg p.o. daily.  10. Saline mist nasal spray 2 sprays nasally every 2 hours as needed for allergies.  11. Norvasc 10 mg p.o. at bedtime.  12. Quetiapine 300 mg p.o. b.i.d.  13. Diazepam 10 mg p.o. once a day as needed for agitation prior to dialysis on Monday, Wednesday, Friday.  14. Metoprolol 50 mg p.o. b.i.d.  15. PhosLo 667 mg 2 capsules twice a day with meals. 16. Hydralazine 50 mg p.o. t.i.d.  17. Lexapro 15 mg p.o. daily.  18. Nystatin triamcinolone topically to affected area q. 12 hours. 19. Multivitamin 1 tablet p.o. daily.   DISCHARGE DIET: Renal diet.   DISCHARGE ACTIVITY: As tolerated.   FOLLOWUP INSTRUCTIONS: Follow for dialysis on Monday, Wednesday and Friday, and she needs sitter for dialysis.   DISCHARGE  LABORATORY DATA: Sodium 136, potassium 4.6, chloride 98, bicarbonate 34, BUN 24, creatinine 5.48, glucose 172, calcium 8.8. WBC 4.0, hemoglobin 7.3, hematocrit 21.2, platelet count 158,000, magnesium 2.0.  BRIEF HOSPITAL COURSE: [name] is a 34 year old African American female with a history of mentally retardation, bipolar-schizoaffective disorder, admitted to the hospital secondary to not being dialyzed and sent from group home and noted to have potassium of 7.5.  1.  Hyperkalemia on admission due to noncompliance with dialysis, which is secondary to underlying mental disorder. The patient was immediately dialyzed and her potassium has normalized. Also, she is very noncompliant with diet and she needs to keep up with her dialysis every other day. She was seen by nephrologist here in the hospital and was monitored on telemetry without any issues. 2.  End-stage renal disease on hemodialysis, needs to be dialyzed Monday, Wednesday, Friday. She has had a few times of dialysis emergency due her potassium being elevated. She will need sitter for dialysis because of her behavioral issues and it is being arranged.  3.  Methicillin-sensitive Staphylococcus aureus sepsis recently, and has been on IV Ancef for 2 weeks. She had a graft that was infected on her right arm which was taken out and has a Perm-A-Cath for dialysis at this time and she has finished her Ancef.  4.  Bipolar disorder, mental retardation, schizoaffective disorder. She initially was placed on involuntary commitment for emergency dialysis. However, we lost the commitment and patient  needs sitter for dialysis. She was seen by psychiatric and is currently on Depakote, trazodone, quetiapine, Valium and Lexapro.  5.  Malignant hypertension.  Again, noncompliant with her diet and dialysis. She is on clonidine, amlodipine, metoprolol and hydralazine.  6.  The patient has been ambulating well, no issues with ambulation. She will be going to adult family  care home.   DISCHARGE DISPOSITION: Adult family care home with sitter for dialysis.   DISCHARGE CONDITION: Stable.   Time spent on discharge: 45 minutes.    ____________________________ Gladstone Lighter, MD rk:lt D: 06/29/2014 13:07:17 ET T: 06/29/2014 13:48:36 ET JOB#: [id number]  cc: Gladstone Lighter, MD, <Dictator> Gladstone Lighter MD ELECTRONICALLY SIGNED 07/13/2014 14:03

## 2015-03-19 NOTE — H&P (Signed)
PATIENT NAME:  Kelly, [name] MR#:  [id number] DATE OF BIRTH:  [birth year]  DATE OF ADMISSION:  06/03/2014  PRIMARY CARE PHYSICIAN:  Nonlocal.   REFERRING PHYSICIAN:  Dr. Conni Slipper.   CHIEF COMPLAINT:  Fever.   HISTORY OF PRESENT ILLNESS:  Kelly [name] is a 34 year old female from a group home sent to the Emergency Department for refusing the dialysis.  Also has history of bipolar disorder.  The patient was noted to have fever of 103, tachycardic.  Unable to obtain any history from the patient.  The patient complained of some abdominal pain.  CT abdomen and pelvis is negative.  The patient is found to have elevated white blood cell count of 12,000 with a left shift.  Concerning about the patient refusing the dialysis, seen by psychiatry, Dr. Weber Cooks, who has known this patient for multiple admissions for refusing further hemodialysis and agitation.  No obvious source of infection is found.  Blood cultures were obtained.  The patient was given vancomycin and Zosyn in the Emergency Department.  Dr. Weber Cooks was unable to reach the legal guardian as the number was disconnected.   PAST MEDICAL HISTORY: 1.  End-stage renal disease on hemodialysis with the AV fistula in the right upper extremity.  2.  Labile hypertension.  3.  Bipolar disorder.  Incompetent in making her own decisions.  4.  Hyperlipidemia.  5.  Secondary hyperparathyroidism.  6.  Anxiety.  7.  Previous history of enterococcus bacteremia.  8.  Diabetes mellitus.  9.  Traumatic brain injury.  10.  Anemia of chronic disease.  11.  Schizoaffective disorder.   PAST SURGICAL HISTORY: 1.  Cholecystectomy.  2.  AV fistula placement.   ALLERGIES:  No known drug allergies.   HOME MEDICATIONS: 1.  Vitamin B complex 1 tablet once a day.  2.  Trazodone 100 mg once a day.  3.  Sertraline 100 mg once a day.  4.  Sensipar 30 mg once a day.  5.  Saline mist orally 2 times a day.  6.  Quetiapine 200 mg 2 times a day.  7.  MiraLAX 17 grams  once a day.  8.  Toprol-XL 100 mg daily.  9.  .  10.  Losartan 100 mg once a day.  11.  Lantus 6 units once a day.  12.  Hydralazine 50 mg 2 times a day.  13.  Folic acid 2 times a day.  14.  Diazepam 10 mg before the dialysis.  15.  Vitamin B12 once a day.  16.  Clonidine 0.2 mg 2 times a day.  17.  Calcium 2 tablets 2 times a day.  18.  Amlodipine 10 mg once a day.   SOCIAL HISTORY:  No history of smoking, drinking alcohol or using illicit drugs.  Lives in a group home, resident of [facility]:  FULL [facility].  FAMILY HISTORY:  Mother had diabetes mellitus.   REVIEW OF SYSTEMS:  Could not be obtained from the patient as the patient is uncooperative and nonverbal.   PHYSICAL EXAMINATION: GENERAL:  This is a well-built, well-nourished, age-appropriate female lying down in the bed, not in distress.  VITAL SIGNS:  Temperature 103.2, pulse 112, blood pressure 177/92, respiratory rate of 18, oxygen saturation 97% on room air.  HEENT:  Head normocephalic, atraumatic.  Eyes, no scleral icterus.  Conjunctivae normal.  Pupils equal and react to light.  Extraocular movements could not examine.  Could not examine the mucous membranes as the  patient is not cooperative.  Could not examine the pharynx.  NECK:  Supple.  No lymphadenopathy.  No JVD.  No carotid bruit debris.  No thyromegaly.  CHEST:  Has no focal tenderness.  LUNGS:  Bilateral clear to auscultation.  HEART:  S1, S2, regular, tachycardia.  ABDOMEN:  Bowel sounds plus.  Soft.  States mild tenderness, however no guarding or rebound tenderness.  Could not appreciate hepatosplenomegaly.  EXTREMITIES:  No pedal edema.  Pulses 2+.  SKIN:  No rash or lesions.  MUSCULOSKELETAL:  Could not examine the range of motion.  NEUROLOGIC:  Unable to examine as the patient is not cooperative.  The patient was deemed to be incompetent.  Could not examine the motor and sensory.   LABORATORY DATA:  CBC:  WBC of 12.5,  hemoglobin 12.5, platelet count of 112 with a left shift of 93%.  CMP:  BUN 83, creatinine of 14, potassium 4.9, ALT 162, AST of 129.  CT abdomen and pelvis:  No acute abnormality, small right mild abdominal wall hernia without any incarceration or strangulation, status post cholecystectomy.  Chest x-ray:  No acute abnormality.   ASSESSMENT AND PLAN:  Kelly [name] is a 34 year old female with schizoaffective disorder, mental retardation, end stage renal disease on hemodialysis who has been refusing therapy as well as the medications and is found to be septic of unknown source.  1.  Sepsis.  The patient is with fever of 103, tachycardic, elevated white blood cell count.  Blood cultures have been obtained.  Continue with vancomycin and Zosyn.  We will de-escalate the antibiotics once culture data is available.  2.  Elevated liver function tests.  We will check the lipase level, especially considering the patient's abdominal pain.  3.  End-stage renal disease on hemodialysis.  The patient has been refusing the dialysis for the last one week.  The patient does not seem to be any fluid overloaded, potassium of 4.9.  Consult nephrology.  We will need to sedate and undergo the dialysis as the patient is deemed to be incompetent.  4.  Hypertension.  Continue the home medications.  5.  Schizoaffective disorder, seen by psychiatry, recommended to continue with the home medications and follow up.  6.  Keep the patient on deep vein thrombosis prophylaxis with heparin.   TIME SPENT:  50 minutes.    ____________________________ Kelly [name], Kelly [name] pv:ea D: 06/03/2014 23:20:52 ET T: 06/03/2014 23:45:03 ET JOB#: [id number]  cc: Kelly [name], Kelly [name], <Dictator> Kelly [name] Kelly [name] ELECTRONICALLY SIGNED 06/04/2014 21:16

## 2015-03-19 NOTE — Op Note (Signed)
PATIENT NAME:  Kelly [name], Kelly [name] MR#:  [id number] DATE OF BIRTH:  1981/06/02  DATE OF PROCEDURE:  06/05/2014  PREOPERATIVE DIAGNOSES:  1.  End-stage renal disease.  2.  Complication arteriovenous dialysis device.  3.  Schizophrenia.  4.  Bipolar disorder.  5.  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  POSTOPERATIVE DIAGNOSES: 1.  End-stage renal disease.  2.  Complication arteriovenous dialysis device.  3.  Schizophrenia.  4.  Bipolar disorder.  5.  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  PROCEDURE PERFORMED: Insertion of triple-lumen temporary dialysis catheter.   SURGEON: Katha Cabal, M.D.   ANESTHESIA: General by IV sedation.   ESTIMATED BLOOD LOSS: Minimal.   SPECIMEN: None.   INDICATIONS: Ms. Librizzi is a 34 year old woman who is very combative, but mentally incompetent and is awarded of the state. She therefore is not capable of making her own decisions. She currently is maintained on hemodialysis, but does not have adequate access and therefore is undergoing placement of a temporary catheter. The only way to accomplish this safely is to perform it under anesthesia.   PROCEDURE IN DETAIL: The patient is taken to the operating room. She is maintained in her bed. She is positioned supine. She is given IV sedation, consistent with a general anesthesia and subsequently her right groin is shaved, prepped and draped in sterile fashion. Ultrasound is placed in a sterile sleeve. Femoral vein is identified. It is echolucent and compressible indicating patency. Image is recorded for the permanent record. Under real-time visualization, Seldinger needle is inserted. J-wire is advanced. Dilators are passed over the wire and the triple-lumen catheter is advanced without difficulty. All 3 lumens aspirate and flush easily. Catheter is secured to the skin of the thigh with 0 Ethibond. Sterile caps and sterile dressing are applied with a Biopatch. The  patient tolerated the procedure well and will be taken to dialysis.    ____________________________ Katha Cabal, MD ggs:ds D: 06/05/2014 12:17:17 ET T: 06/05/2014 16:21:38 ET JOB#: [id number]  cc: Katha Cabal, MD, <Dictator> Katha Cabal MD ELECTRONICALLY SIGNED 06/22/2014 12:24

## 2015-03-19 NOTE — Op Note (Signed)
PATIENT NAME:  Kelly [name], Kelly [name] MR#:  [id number] DATE OF BIRTH:  [birth year]  DATE OF PROCEDURE:  10/26/2014  PREOPERATIVE DIAGNOSES: 1.  Superior vena cava syndrome.  2.  End-stage renal disease requiring hemodialysis.  3.  Complication of dialysis device with multiple graft infections.  4.  Schizophrenia and dementia.   POSTOPERATIVE DIAGNOSES:  1.  Superior vena cava syndrome.  2.  End-stage renal disease requiring hemodialysis.  3.  Complication of dialysis device with multiple graft infections.  4.  Schizophrenia and dementia.   PROCEDURE PERFORMED: 1.   Introduction catheter into superior vena cava, left brachial vein approach.  2.  Introduction catheter into brachial vein, right arm approach.  3.  Percutaneous transluminal angioplasty of the left innominate and superior vena cava confluence.   SURGEON: Katha Cabal, MD   SEDATION: Precedex drip.   CONTRAST USED: Isovue 25 mL.   FLUOROSCOPY TIME: 4.5 minutes.   INDICATIONS: Kelly [name] is a 34 year old woman who is currently being maintained on hemodialysis via a right tunneled catheter. The patient is undergoing venography as she has a history of superior vena cava syndrome and extensive multiple grafts bilaterally. This is to aid in planning for future operation. Risks and benefits were explained to the guardian. Guardian agrees for [place] to proceed.   DESCRIPTION OF PROCEDURE: The patient is taken to special procedures and placed in the supine position. After adequate sedation has been achieved, she is positioned supine with her left arm extended palm upward and her right arm extended palm upward. Left and right arms are prepped and draped in a sterile fashion. Appropriate timeout is called.   Ultrasound is placed in a sterile sleeve. Brachial vein on the left is identified. It is echolucent and compressible and under real-time visualization, after 1% lidocaine has been infiltrated, a micropuncture needle is used to access the  brachial vein, microwire followed by micro sheath. Hand injection of contrast demonstrates multiple areas of stricture, stenosis, some areas of occlusion. Previously placed stent from the brachial, axillary graft is occluded and there appears to be occlusion of the innominate vein.   Glidewire and KMP catheter are then negotiated. This does not allow for enough purchase and therefore KMP catheter is in removed and a 5 French sheath is inserted over the wire. The KMP catheter is then reintroduced and magnified imaging of the innominate vein is obtained with hand injection. There does appear to be a faint trail of contrast extending from the innominate vein down the right-sided catheter and into the inferior vena cava and; therefore, using the Kumpe catheter and the Glidewire this small area is accessed and the catheter and wire are advanced into the superior vena cava. This does prove that there is potential for a HeRO graft and given how hard it was to get through the stricture, I elected to angioplasty this to 6 mm, which is performed using a 6 x 4 balloon inflated to 14 atmospheres for several minutes. Followup imaging demonstrates there is now is significant improvement in flow and a marked reduction in filling of the collaterals that were noted initially.   The wire and balloon are removed. The sheath is pulled and pressure is held. Attention is then turned to the right arm where ultrasound is utilized, identifying the brachial vein. Lidocaine is infiltrated. Micropuncture needle is used to access the brachial vein. Microwire followed by micro sheath. Micro sheath is then used for hand injection. Multiple strictures and stenoses of the brachial system in the upper  arm on the right, as well as the subclavian and innominate veins on the right are noted as well. It would appear that de novo, the left side offers a better chance for  placement of a HeRO graft; however, it should be noted that the right side does  have an existing tunneled catheter and if an instant stick graft is utilized, a HeRO graft could be created on the right side as well.   Micro sheath on the right is pulled, pressure is held, and there are no immediate complications. The patient tolerated the procedure well.    ____________________________ Katha Cabal, MD ggs:LT D: 10/27/2014 19:36:00 ET T: 10/27/2014 20:42:08 ET JOB#: [id number]  cc: Katha Cabal, MD, <Dictator> Dr. Tammi Klippel MD ELECTRONICALLY SIGNED 10/30/2014 16:40

## 2015-03-19 NOTE — Op Note (Signed)
PATIENT NAME:  Kelly [name], Kelly [name] MR#:  [id number] DATE OF BIRTH:  August 12, 1981  DATE OF PROCEDURE:  06/11/2014  PREOPERATIVE DIAGNOSES:  1.  Superior vena cava syndrome.  2.  End-stage renal disease requiring hemodialysis.  3.  Complication dialysis device with infection of right arm brachial axillary dialysis graft.   POSTOPERATIVE DIAGNOSES: 1.  Superior vena cava syndrome.  2.  End-stage renal disease requiring hemodialysis.  3.  Complication dialysis device with infection of right arm brachial axillary dialysis graft.   PROCEDURE PERFORMED: 1.  Insertion of cuffed tunneled catheter, right side via a collateral vein using ultrasound and fluoroscopic guidance.  2.  Dressing change, right arm.   PROCEDURE PERFORMED BY: Katha Cabal, M.D.   SEDATION: Versed 10 mg p.o., plus Precedex drip. Continuous ECG, pulse oximetry, and cardiopulmonary monitoring is performed throughout the entire procedure by the interventional radiology nurse. Total sedation time was 45 minutes.   ACCESS: Collateral vein, supraclavicular area, right side.   FLUOROSCOPY TIME: Approximately 3 minutes.   CONTRAST USED: None.   INDICATIONS: [name] is a 34 year old woman who recently underwent excision of her graft secondary to a staphylococcus infection. She is now undergoing placement of Perm-A-Cath so that she can continue her dialysis treatments as an outpatient. During the sedation, she will also undergo changing of her dressing from her graft excision.   DESCRIPTION OF PROCEDURE: The patient is brought to the special procedure suite, placed in the supine position. After adequate sedation is achieved, she is positioned supine with her neck extended slightly,  rotated to the left. Neck and chest wall are prepped and draped in a sterile fashion on the right side.   Ultrasound is then placed in a sterile sleeve. Ultrasound is used to examine the neck, as well as the supraclavicular area. The jugular vein is  clearly scarred and not patent any longer. After searching, a collateral vein is identified. It does appear to be coursing toward the central venous system and, therefore, 1% lidocaine is infiltrated and a micropuncture needle is used to access the vein. The vein is compressible and echolucent indicating patency and an image is recorded for the permanent record. With fluoroscopic guidance and the needle returning good blood flow,  the micro wire is then negotiated.  After subtle manipulations, the wire is advanced down toward and ultimately into the atrium. Having achieved a successful venous path, the micro sheath is then inserted and an Amplatz Super Stiff wire is advanced. In wire does not course into the inferior vena cava and therefore a Kumpe catheter is advanced over the wire, and ultimately the Amplatz wire is exchanged for a Glidewire. Catheter is negotiated into the inferior vena cava and the Amplatz Super Stiff wire is replaced.   A small incision is made at the wire insertion site and a pocket fashioned with blunt dissection and subsequently the dilators are passed over the wire and then the dilator with the peel-away sheath.  Dilator is removed and the catheter is advanced over the wire and through the peel-away sheath.  After verifying catheter tips are in the atrium, the wire is removed and subsequently the peel-away sheath is removed. Wire is then approximated to the chest wall with the tips in the proper position. Exit site is selected. Lidocaine 1% is infiltrated and a small incision is made. Tunneling device is passed subcutaneously from the exit site to the neck counterincision.  Tract is dilated, and the catheter is pulled subcutaneously and the tips are adjusted  for proper position under fluoroscopic guidance. The catheter is transected, and the hub is connected. Both lumens aspirate and flush easily, and the catheter has a smooth contour with the tips at the atrio-caval junction. Both lumens  are then packed with 5000 units of heparin per lumen. The catheter is secured to the chest wall with 0 silk. Neck counterincision is closed with 4-0 Monocryl subcuticular and then Dermabond. Sterile dressing is applied to the exit site.   The patient is then uncovered, her arms extended.  The existing VAC dressing is removed. The wounds are quite clean. There is minimal edema. There is no in duration and the wounds are then packed with Iodoflex covered with gauze and wrapped with Kerlix. The patient tolerated the procedure well. There were no immediate complications. Sponge and needle counts were correct. She is taken to the recovery area in excellent condition.    ____________________________ Katha Cabal, MD ggs:ts D: 06/11/2014 18:07:19 ET T: 06/11/2014 23:45:03 ET JOB#: [id number]  cc: Katha Cabal, MD, <Dictator> Harmeet SinghMD Katha Cabal MD ELECTRONICALLY SIGNED 06/22/2014 12:25

## 2015-03-19 NOTE — Consult Note (Signed)
Psychiatry: Follow-up for this young woman with mental retardation and schizoaffective disorder.  She was awake and slightly interactive today although as usual she chooses to only shake her head or not.  When I ask her if she was still able to speak she did speak to me but then went back to shaking her head and nodding.  She denies any specific symptoms right now.  Behavior seems to be pretty well controlled.  No sign of obvious side effects from her medicine. in the emergency room I was discussing the patient with our discharge coordinator who is very familiar with her and her situation.  He had been speaking to the owner of the group home where the patient had been residing.  Group home owner evidently had not been aware that the patient was still in the hospital and how sick she had been.  There is some speculation that now that she knows that the patient is getting better that she may be willing to take her back to the group home.  unless the patient clearly needs a higher level of care I would recommend that social work and discharge coordination at least contact the previous group home.  No change to medicine.  Electronic Signatures: Theseus Birnie, Madie Reno (MD)  (Signed on 18-Jul-15 20:16)  Authored  Last Updated: 18-Jul-15 20:16 by Gonzella Lex (MD)

## 2015-03-19 NOTE — H&P (Signed)
PATIENT NAME:  Kelly [name], Kelly [name] MR#:  [id number] DATE OF BIRTH:  03-30-1981  DATE OF ADMISSION:  06/22/2014  REFERRING PHYSICIAN: Dr. Lisa Roca.   PRIMARY CARE PHYSICIAN: Dr. Nicky Pugh.   CHIEF COMPLAINT: Sent by group home as she was refusing hemodialysis.   HISTORY OF PRESENT ILLNESS: This is a 34 year old female with history of bipolar disorder. Incompetent of making her own decisions. She was sent from group home as the patient was refusing hemodialysis. The patient was recently discharged from [facility] on July 23rd where she was hospitalized for workup of sepsis with the diagnosis of MSSA bacteremia from infected right upper extremity AV graft. Status post removal and excision. She was getting her hemodialysis through a right chest PermCath, where she is on IV Ancef. At the group home, the patient has been refusing her hemodialysis since discharge which was last Friday and Monday, so group home sent her here for further evaluation. In the Emergency Room, the patient was agitated, combative which required giving her few IM antipsychotic medications including Tegretol and Haldol as well. The patient had basic workup done which did show significant hyperkalemia at 7.5, as well as a creatinine of 14.9 and uncontrolled sugar of 319. The patient has anemia at 7.8 as well which has dropped from her baseline upon discharge which was 9.4. ED contacted nephrology who is arranging for emergent hemodialysis for the patient. As well, the patient was involuntarily committed by ED physician.   PAST MEDICAL HISTORY:  1. End-stage renal disease, on hemodialysis.  2. Hypertension.  3. Bipolar disorder and incompetent in making her own decisions.  4. Hyperlipidemia.  5. Secondary hyperparathyroidism.  6. Anxiety.  7. Previous history of enterococcus  bacteremia.  8. recent diagnosis of MSSA bacteremia secondary to AV fistula infection in right upper extremity.  9. Traumatic brain injury.  10.  Diabetes mellitus.  11. Anemia of chronic disease.  12. Schizoaffective disorder.   PAST SURGICAL HISTORY:  1. Cholecystectomy.  2. AV fistula placement.  3. Excision of her right arm AV fistula.   ALLERGIES: No known drug allergies.   HOME MEDICATIONS:  1. Trazodone 100 mg at bedtime.  2. Vitamin B complex 100 at bedtime.  3. Losartan 100 mg daily.  4. MiraLAX daily as needed.  5. Norvasc 10 mg daily.  6. Medroxyprogesterone 1 mL IM every 3 months.  7. Zoloft 100 mg daily.  8. Calcium acetate 2 capsules b.i.d.  9. Clonidine 0.2 mg b.i.d.  10. Aspirin 81 mg daily.  11. Sensipar 30 mg daily.  12. Valium 5 mg 2 tablets before dialysis.  13. Folic acid 1 mg 2 tablets daily.  14. Lantus 6 units at bedtime.  15. Seroquel 200 mg b.i.d.  16. Hydralazine 50 mg b.i.d.  17. Metoprolol 100 mg at bedtime.  18. Depakote 1000 mg at bedtime.  19. Ancef 2 g IV on Monday, Wednesday, Friday on hemodialysis until August 11th.   SOCIAL HISTORY: No smoking. No drinking. No illicit drugs. The patient lives at a group home.  [place]: FULL CODE.   FAMILY HISTORY: Significant for diabetes in her mother.   REVIEW OF SYSTEMS: The patient is slightly uncooperative for the physical exam and review of systems.  CONSTITUTIONAL: Denies any fever, chills, fatigue.  EYES: Denies blurry vision, double vision, inflammation.  EARS, NOSE, THROAT: Denies tinnitus, ear pain.  RESPIRATORY: Denies cough, wheezing, hemoptysis.  CARDIOVASCULAR: Denies chest pain, syncope.  GASTROINTESTINAL: Denies nausea, vomiting, diarrhea, abdominal  pain.  GENITOURINARY: Anuric.   ENDOCRINE: Denies polyuria, polydipsia, heat or cold intolerance.  HEMATOLOGY: Denies easy bruising .   MUSCULOSKELETAL: Denies any arthritis, cramps.  NEUROLOGIC: Denies any focal deficits, tingling, numbness.  PSYCHIATRIC: Denies any suicidal ideation or thoughts to hurt herself.   PHYSICAL EXAMINATION:  VITAL  SIGNS: Temperature 97.8, pulse 81, respiratory rate 20, blood pressure 195/87, saturating 99% on room air.  GENERAL: Well-nourished female, looks comfortable in bed, in no apparent distress.  HEENT: Head is atraumatic, normocephalic. Pupils equal, reactive to light. Pink conjunctivae. Anicteric sclerae.  NECK: Supple. No thyromegaly. No JVD. No carotid bruits.  CHEST: Good air entry. No wheezing, rales, rhonchi.  CARDIOVASCULAR: S1, S2 heard. No rubs, murmurs or gallops.  ABDOMEN: Soft, nontender, nondistended. Bowel sounds present.  EXTREMITIES: No edema. No clubbing. No cyanosis. Pedal pulses +2 bilaterally.  SKIN: Has right upper extremity wound from the excision of her AV fistula. No drainage. No bleeding at the site.  NEUROLOGIC: Cranial nerves grossly intact. Motor 5 out of 5. No focal deficits.   PERTINENT LABORATORIES: Glucose 319, BUN 68, creatinine 14.9, sodium 126, potassium 7.5, chloride 88. White blood cells 6.6, hemoglobin 7.8, hematocrit 24.6, platelets 194.   ASSESSMENT AND PLAN:  1. Hyperkalemia: This is secondary to the patient missing her hemodialysis as she is noncompliant. Ed discussed with nephrology. Will have emergent hemodialysis done for the patient. Will monitor her on telemetry. has no EKG abnormalities 2. Combative behavior: The patient is involuntarily committed. Will have sitter. Will consult psychiatry. Will order p.r.n. antipsychotic medication for her . 3. History of methicillin-sensitive Staphylococcus aureus bacteremia: Will continue with cefazolin during hemodialysis.  4. End-stage renal disease: Will consult nephrology to continue hemodialysis.  5. Hypertension: Uncontrolled. Will resume her back on her home medications so I would anticipate improvement after hemodialysis.  6. Diabetes: Will restart her on insulin sliding scale.  7. Anemia of chronic disease: Will have nephrology evaluate  the patient to see if she would qualify for Epogen.  8. Bipolar  disorder and schizoaffective disorder: Will consult psychiatry. Will resume her back on her psychiatry medications.  9. Deep vein thrombosis prophylaxis: Subcutaneous heparin.   CODE STATUS: FULL CODE.   TOTAL TIME SPENT ON ADMISSION AND PATIENT CARE: 55 minutes.   ____________________________ Albertine Patricia, MD dse:gb D: 06/22/2014 02:15:05 ET T: 06/22/2014 02:57:39 ET JOB#: [id number]  cc: Albertine Patricia, MD, <Dictator> Amberley Hamler Graciela Husbands MD ELECTRONICALLY SIGNED 06/22/2014 5:24

## 2015-03-19 NOTE — Op Note (Signed)
PATIENT NAME:  Kelly [name], Kelly [name] MR#:  [id number] DATE OF BIRTH:  1981/08/15  DATE OF PROCEDURE:  06/08/2014  PREOPERATIVE DIAGNOSES:  1. Infected right brachial axillary dialysis graft.  2. End-stage renal disease.  3. Schizophrenia with associated bipolar disorder.  4. Mentally incompetent.  POSTOPERATIVE DIAGNOSES:  1. Infected right brachial axillary dialysis graft.  2. End-stage renal disease.  3. Schizophrenia with associated bipolar disorder.  4. Mentally incompetent.  PROCEDURES PERFORMED:   1. Excision of right arm brachial axillary dialysis graft with repair of brachial artery using a CorMatrix xenograft patch. 2. Repair of brachial artery defect using CorMatrix xenograft patch.   PROCEDURE PERFORMED BY:  Katha Cabal, MD    ANESTHESIA: General by LMA.   FLUIDS: Per anesthesia record.   ESTIMATED BLOOD LOSS: 100 mL.   SPECIMEN: Graft is photographed for the permanent record.   INDICATIONS: This patient is a 34 year old woman who presents with worsening signs of infection and sepsis. She has been identified as having infection of her graft and the graft is therefore being removed. The risks and benefits are reviewed amongst the physicians. Consent has been obtained, as this is an urgent procedure which is life-saving.   DESCRIPTION OF PROCEDURE: The patient is taken to the operating room and placed in the supine position. After adequate general anesthesia is induced and appropriate invasive monitors are placed, she is positioned supine with her right arm extended palm upward.  Right arm is prepped and draped in a sterile fashion.   A small incision is created over the arterial anastomosis through the previous incisional scar, and upon entering the subcutaneous tissues, approximately 10-15 mL of pus is encountered. The dissection is then continued to expose the graft and the graft is then used as a marker to follow down.  The brachial artery is dissected proximally and  distally, and once vascular control has been obtained proximal distally with vessel loops, the graft is ligated above the anastomosis with an Ethibond and then transected. It is then meticulously freed from the brachial artery by using an 11 blade to slice through the existing suture line. Once the graft is entirely removed and no pieces of plastic remain, a CorMatrix xenograft patch is delivered onto the field and used to repair the arterial defect using running 6-0 Prolene. Flushing maneuvers are performed and flow was re-established to the hand. The wound is then copiously irrigated. The graft is tucked up away from the wound and the wound is then closed in 3 layers using running 3-0 Vicryl. Skin is left open.   In a similar fashion, previous incisional scar is then incised in the axilla and the graft is identified. Once the suture line has been dissected so that it is readily seen, a pursestring suture of 5-0 Prolene is placed around the graft and the graft is then taken off the vein.  Pre-existing stent is then pulled out of the vein with moderate difficulty, and undoubtedly, there are some small pieces of stent left behind, but these are not able to be retrieved.   The axillary wound is then irrigated.  A segment of graft is then dissected from the surrounding tissues and then the axillary wound is closed in multiple layers using 3-0 Vicryl.   The ulcerated infected area of the main graft to essentially the cannulation zone is then addressed. The incision made around the wound and then extending along the graft in a teardrop shape.  Subsequently, the graft is encountered and it is removed  in toto in the arterial portion and then in the venous portion. Bleeding is controlled with Bovie cautery and Monocryl suture. The wound is then irrigated.  It is then partially closed. A VAC is then applied to the wound bridging all 3 incisions. The patient tolerated the procedure well without hemodynamic instability  and there are no immediate complications.    ____________________________ Katha Cabal, MD ggs:dd D: 06/09/2014 19:45:46 ET T: 06/10/2014 04:00:12 ET JOB#: [id number]  cc: Katha Cabal, MD, <Dictator> Katha Cabal MD ELECTRONICALLY SIGNED 06/22/2014 12:25

## 2015-03-19 NOTE — Consult Note (Signed)
PATIENT NAME:  Kelly [name], Kelly [name] MR#:  [id number] DATE OF BIRTH:  26-May-1981  DATE OF CONSULTATION:  06/03/2014  REFERRING PHYSICIAN:   CONSULTING PHYSICIAN:  Gonzella Lex, MD  IDENTIFYING INFORMATION AND REASON FOR CONSULTATION:  A 34 year old woman brought in from her group home with a report that she is refusing dialysis. Consultation because of behavior problems.   HISTORY OF PRESENT ILLNESS: Very little information obtainable. The patient refuses to talk to me. Group home reports that she is refusing dialysis. I do not have any other information at this point. I do not know whether she has been compliant with her medication, do not know what other symptoms she has been having recently, just know that we are being told she is refusing dialysis.   PAST PSYCHIATRIC HISTORY: Well known to our service from multiple previous evaluations for exactly the same problem. The patient has mental retardation, as well as schizophrenia and behavior problems. She has end-stage renal disease and has been on dialysis for years. She periodically refuses dialysis or becomes agitated. She has been maintained on psychiatric medicine in the past with good effect. Worsening behaviors could be the result of medical disease, stress, noncompliance with medication. It is possible that this could be from an infection or something else that would alter her physical health.   SOCIAL HISTORY: The patient has been declared incompetent. She has a legal guardian. I tried to reach her legal guardian on the phone, but the phone number we currently have in the computer is listed as disconnected. Nevertheless, it is worth noting the patient is not able to actually refuse any treatment because of her competency status.   PAST MEDICAL HISTORY: End-stage renal disease.   SUBSTANCE ABUSE HISTORY: Not relevant.   MEDICATIONS: I do not have a current list.  I know that in the past she was taking Depakote 1000 mg at night, Seroquel 300 mg  at night, trazodone 150 mg at night, and Valium 10 mg before each dialysis treatment.   ALLERGIES: NO KNOWN DRUG ALLERGIES.   REVIEW OF SYSTEMS: None available.   MENTAL STATUS EXAMINATION: The patient grunted when I spoke her name, but then refuse to open her eyes or make any other response to my talking to her. No other information obtainable. She has covers pulled up to her face and looks like she is acting asleep.   LABORATORY RESULTS: She has an elevated white count, currently at 14.2, lipase normal,  creatinine, of course, is wildly elevated at 14.3, potassium is 5.5, glucose 319, CBC is normal.   ASSESSMENT: A 34 year old woman with mental retardation and history of schizoaffective disorder or just behavior problems related to mental retardation. Very little information available right now. She has done this in the past, periodically refusing dialysis. She is legally not able to refuse. I have not been able to reach her guardian on the phone right now.   TREATMENT PLAN: I have put in orders for the Seroquel, Depakote, trazodone, and Valium as she was taking them in the past, as well as p.r.n. Haldol. I have spoken to the Emergency Room physician. I suggest that she be medically treated as deemed appropriate and sedated as necessary if she is combative or attempting to refuse treatment. Emergency Room staff needs to find her guardian. Hopefully, her group home will have that information. We will follow as needed.   DIAGNOSIS, PRINCIPAL AND PRIMARY:  AXIS I: Schizoaffective disorder.   SECONDARY DIAGNOSES:  AXIS II: Mental retardation.  AXIS  III: Diabetes, end-stage renal disease.  AXIS IV: Severe from chronic illness.  AXIS V: Functioning at time of evaluation is 20.    ____________________________ Gonzella Lex, MD jtc:ts D: 06/03/2014 19:19:23 ET T: 06/03/2014 19:59:21 ET JOB#: [id number]  cc: Gonzella Lex, MD, <Dictator> Gonzella Lex MD ELECTRONICALLY SIGNED 07/07/2014  0:37

## 2015-03-19 NOTE — Consult Note (Signed)
Psychiatry: Follow-up note for this woman with mental retardation and schizoaffective disorder.  Brought into the hospital and cooperative.  Turned out she had an infection.  She is now back on her regular medication and getting dialyzed regularly.  On interview today she has no complaints.  Doesn't report any anger or hostility.  She says she feels ready to go back home. mental status exam patient is calm and appropriate good eye contact slow psychomotor activity blunted affect mood stated as okay.  Denies hallucinations denies suicidal or homicidal ideation.  Shows and understanding basically that she needs to cooperate with treatment. is a mentally retarded woman who is understanding of her situation is extremely limited.  She is legally incompetent and not able to refuse any treatment.  When she is on her medication she tends to be fairly easy to work with.  If she were to in the future refuse to cooperate often finding something like candy or prizes that we will help to motivator is useful.  For now continue her current medications.  She has regular follow-up in the community at discharge.  Electronic Signatures: Gonzella Lex (MD)  (Signed on 11-Jul-15 20:22)  Authored  Last Updated: 11-Jul-15 20:22 by Gonzella Lex (MD)

## 2015-03-19 NOTE — Consult Note (Signed)
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   Patient was seen by consultant.   Consult note dictated.   Recommend further assessment or treatment.   Orders entered.   Comments: Psychiatry: Patient seen and chart reviewed.  Note dictated.  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  She also has end-stage renal disease on dialysis.  He was brought to the hospital with the report that she was refusing dialysis.  Full note dictated but the patient is endorsing to me some worsening of her mood and more prominence of hallucinations.  I'm not sure how much to rely on her history.  I have made some changes to her medicine.  Increased dose of Seroquel to 300 mg twice a day and discontinue the Zoloft and replace it with Lexapro 15 mg a day.  I will continue to follow up for daily monitoring and support.  Spent some time this evening doing some supportive therapy.  Electronic Signatures: Gonzella Lex (MD)  (Signed 29-Jul-15 18:49)  Authored: Brief Consult Note   Last Updated: 29-Jul-15 18:49 by Gonzella Lex (MD)

## 2015-03-19 NOTE — Consult Note (Signed)
Brief Consult Note: Diagnosis: Schizoaffective disorder, mental retardation.   Patient was seen by consultant.   Recommend further assessment or treatment.   Comments: Kelly [name] has a h/o schizoaffective disorder, mental retardation and ESRD refusing dialysis. She has been maintained on Seroquel 300mg  qhs, Trazodone 200 mg qhs, Depakote 1000mg  qhs and valium 10mg  prior to dialysis. Her guardian is Earley Brooke at [phone]. Patient is incompetant and can not refuse treatment against guardian consent. She has Actuary. She reportedly required sitters during her dialysis in the community.  She did have dialysis today but has been refusing oral medications and heparin. She is asleep now and hard to awake. Her behavior this am, per sitter, was appropriate.  PLAN: 1. She is on IVC. Landscape architect.  2. Will try to assess her tomorrow.  Electronic Signatures: Orson Slick (MD)  (Signed 28-Jul-15 14:16)  Authored: Brief Consult Note   Last Updated: 28-Jul-15 14:16 by Orson Slick (MD)

## 2015-03-19 NOTE — Consult Note (Signed)
PATIENT NAME:  Kelly [name], Kelly [name] MR#:  [id number] DATE OF BIRTH:  09/08/1981  DATE OF CONSULTATION:  07/01/2014  REFERRING PHYSICIAN:   CONSULTING PHYSICIAN:  Gonzella Lex, MD  IDENTIFYING INFORMATION AND REASON FOR CONSULTATION: A 34 year old woman with chronic cognitive functioning. She was petitioned to the hospital with complaints that she had been agitated and uncooperative with treatment and aggressive to others.   UPDATED HISTORY OF PRESENT ILLNESS: Nurse intake talked apparently to the group home yesterday and were told that she had been up all night into people's rooms, loud, refusing treatment. As I mentioned yesterday, the patient states that is a lie. Since being here in the Emergency Room she has been pleasant and calm and easy to work with and has not refused treatment. She is not making any threatening statements. She is not making any suicidal statements. She appears to be at her baseline in terms of her behavior and thinking.   ASSESSMENT: After further evaluation it seems clear to me that she is at her baseline. There is no indication for psychiatric hospitalization. She is already on chronic psychiatric medicine, which has been used during her hospitalizations. She is agreeable to continuing that. There is no indication for further hospital level or psychiatric treatment. I am going to take her off commitment and recommend that she be discharged back home.   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   SECONDARY DIAGNOSES: AXIS I: No further.  ____________________________ Gonzella Lex, MD jtc:sb D: 07/01/2014 11:41:45 ET T: 07/01/2014 12:07:12 ET JOB#: [id number]  cc: Gonzella Lex, MD, <Dictator> Gonzella Lex MD ELECTRONICALLY SIGNED 07/22/2014 22:43

## 2015-03-19 NOTE — Consult Note (Signed)
Psychiatry: Follow-up for this young woman with mental retardation and schizoaffective disorder who is on hemodialysis.  She was awake today when I came in but less interactive.  Made minimal eye contact.  She responded however to questions at least briefly.  Affect flat mood was stated as fine.  Not showing any aggressive or hostile behavior.  Generally cooperating with treatment.  She still primarily wants to go home. status exam today she is calm in her behavior.  Chronically cognitively impaired.  No suicidal ideation.  No psychotic symptoms or hallucinations judgment and insight poor.  Patient is not competent and not able to make her own decisions but appears to be back at her baseline mental state. current medication.  Patient should make sure she continues to get her mental health follow-up when she goes back to her group home.  Supportive therapy done. [place] primary Axis I delirium due to infection, secondary diagnoses schizoaffective disorder next diagnoses moderate mental retardation  Electronic Signatures: Clapacs, Madie Reno (MD)  (Signed on 12-Jul-15 17:47)  Authored  Last Updated: 12-Jul-15 17:47 by Gonzella Lex (MD)

## 2015-03-19 NOTE — Discharge Summary (Signed)
PATIENT NAME:  Kelly [name], Kelly [name] MR#:  [id number] DATE OF BIRTH:  1981-09-14  DATE OF ADMISSION:  06/03/2014 DATE OF DISCHARGE:  06/17/2014  For a more detailed note, please see history and physical done on admission by Dr. Lunette Stands. Please also see the interim discharge summary done by Dr. Marthann Schiller which covers hospital course from admission from July 09 until July 14. This is hospital course from July 14 until July 23, the day of discharge.   DIAGNOSES UPON DISCHARGE:   1.  Sepsis secondary to MSSA bacteremia from infected right upper extremity AV graft.  2.  End-stage renal disease on hemodialysis.  3.  Hypertension.  4.  Diabetes.  5.  Anemia of chronic disease.  6.  Bipolar disorder/schizoaffective disorder.   The patient is being discharged on a low-sodium, low-fat, American Diabetic Association, renal diet.   ACTIVITY: As tolerated.   DISCHARGE MEDICATIONS: Trazodone 100 mg at bedtime, vitamin B complex 100 mg at bedtime, losartan 100 mg daily, MiraLax daily as needed, amlodipine 10 mg daily, medroxyprogesterone 1 mL intramuscularly every 3 months, Zoloft 100 mg daily, calcium acetate 1600 mg 2 capsules b.i.d., saline mist 2 sprays q.2 hours as needed, clonidine 0.2 mg b.i.d., aspirin 81 mg daily, Sensipar 30 mg daily, Valium 5 mg 2 tablets before dialysis, folic acid 1 mg 2 tablets daily, Lantus 60 units at bedtime, Seroquel 100 mg 2 tablets b.i.d., hydralazine 50 mg b.i.d., metoprolol succinate 100 mg at bedtime, Depakote 1000 mg at bedtime and Ancef 2 grams IV on Mondays, Wednesdays, and Fridays with hemodialysis, last dose to be given on 07/06/2014.   CONSULTANTS DURING THE HOSPITAL COURSE:  Dr. Anthonette Legato from nephrology, Dr. Hortencia Pilar from vascular surgery.   PERTINENT STUDIES DONE DURING THE INTERIM COURSE:  None.   HOSPITAL COURSE:  This is a 34 year old female with medical problems as mentioned above, presented to the hospital on 06/03/2014 due to sepsis. This  patient presented with fever, tachycardia and leukocytosis.  1.  Sepsis. The most likely cause source of the patient's sepsis infected right upper extremity AV fistula. The patient underwent removal of the right upper extremity AV fistula on July 14. The patient's blood cultures grew out methicillin sensitive Staphylococcus aureus. Initially, the patient was on vancomycin and Zosyn.  After blood cultures came back the patient was switched over to Ancef. The patient has been on Ancef since then, she has been afebrile and hemodynamically stable. She currently is being discharged on IV Ancef at dialysis for a total of 4 weeks of therapy since the graft was removed.  Her last day of Ancef would be on 07/06/2014.  2.  Elevated LFTs.  These have remained stable. The patient underwent a CT scan of the abdomen and pelvis which showed no evidence of any acute intra-abdominal pathology.  3.  Anemia of chronic disease. The patient's hemoglobin has remained stable. She has not required any transfusion while in the hospital.  4.  End-stage renal disease on hemodialysis. The patient as mentioned above did present with a right upper extremity AV fistula, which was infected. This has now been removed. The patient has a Perma-Cath, which is located in the right chest. She is tolerating hemodialysis through that well. Her scheduled days are Monday, Wednesday, and Friday, and she will continue that as an outpatient.  5.  Hypertension. The patient's blood pressure has remained stable. She will continue on Norvasc, metoprolol, hydralazine, losartan and clonidine as stated.  6.  Diabetes. The patient's blood sugars have  remained stable. She will continue her Levemir and sliding scale insulin as mentioned.  7.  History of bipolar schizoaffective disorder. The patient has been maintained on her Seroquel, Depakote and trazodone. She will also continue that.   CODE STATUS: The patient is a full code.   DISPOSITION: She is being  discharged to a skilled nursing facility for ongoing care.   Time spent at discharge: 45 minutes.    ____________________________ Belia Heman. Verdell Carmine, MD vjs:lt D: 06/17/2014 14:23:00 ET T: 06/17/2014 14:58:02 ET JOB#: [id number]  cc: Belia Heman. Verdell Carmine, MD, <Dictator> Henreitta Leber MD ELECTRONICALLY SIGNED 06/24/2014 14:07

## 2015-03-19 NOTE — Consult Note (Signed)
Psychiatry: I did not come by to see the patient today as she has been quite stable and really doesn't care to interact with me anyway.  I did get a call from nursing on the floor that the commitment papers have expired.  There was a question as to whether we wanted to do anything about it.  The patient hasn't been trying to leave and as I pointed out to them she is legally incompetent and not legally able to make any of her decisions for herself anyway so the commitment is really not necessary to make her stay in the hospital.  At this point I think it's fine to let it expire.  I will continue to follow up as needed.  Electronic Signatures: Gonzella Lex (MD)  (Signed on 20-Jul-15 18:16)  Authored  Last Updated: 20-Jul-15 18:16 by Gonzella Lex (MD)

## 2015-03-20 NOTE — Discharge Summary (Signed)
PATIENT NAME:  Kelly [name], Kelly [name] MR#:  [id number] DATE OF BIRTH:  1981/08/18  DATE OF ADMISSION:  03/04/2012 DATE OF DISCHARGE:  03/15/2012   This summary covers the dates from 03/13/2012 until 03/15/2012.   CURRENT DIAGNOSES:  1. Sepsis secondary to infected dialysis catheter, which has been removed. 2. Diabetes. 3. Hypertension.  4. Hyperlipidemia.  5. Secondary hyperparathyroidism.  6. End-stage renal disease, Monday, Wednesday, Friday dialysis. 7. Significant psych history with mood disorder, borderline intellectual function with cognitive impairment, currently at [facility].  CONSULTS:  1. Vascular Surgery, Dr. Delana [facility]  2. Nephrology  3. Psychiatry with Dr. Woody Seller MEDICATIONS:  1. Sensipar 30 mg q.24 hours. 2. Vitamin B12 100 mcg daily.  3. Depakote 500 mg extended-release in the morning and 750 mg at bedtime in the form of Divalproex. 4. Docusate sodium 100 mg b.i.d.  5. Folic acid 1 mg daily.  6. Lisinopril 5 mg daily. 7. Quetiapine XR 300 mg nightly.  8. Sevelamer 0.8 gram orally t.i.d. with meals.  9. Trazodone 100 mg at bedtime.  10. Aspirin 81 mg daily.  11. Heparin 5000 units b.i.d.  12. Lantus 20 units at bedtime.  13. Sliding scale insulin. 14. Vancomycin with dialysis weight based for another week, i.e. x3 dialysis sessions.   DISPOSITION: The patient will be transferred to [facility].   ACTIVITY: As tolerated.   DIET: Renal, diabetic, ADA diet.   FOLLOW-UP:  1. Please follow-up with your psychiatrist within one week.  2. Please follow-up with your Nephrology physician and undergo dialysis Monday, Wednesday, Friday.   3. Please follow-up with her vascular surgeon for follow-up for the AV graft.   HOSPITAL COURSE: Since 03/13/2012, the patient did well. The patient has undergone left AV graft placement by Dr. Delana Meyer on 03/14/2012. She has been afebrile. Sitter has been continued. Currently the patient has a  left-sided PermCath and a left upper extremity graft. She did have an episode of hyperkalemia on April 17th with a potassium of 8, postdialysis was 5.7. The patient had another dialysis session on Friday. The last potassium is 4.8. She is to continue having Vanc with dialysis for three additional sessions. Her Vanc through level currently is 28. She has remained afebrile. At this point, she will be discharged back to [facility] for further care. She continues to have one-to-one sitter and suicide precautions.   TOTAL TIME SPENT: 40 minutes.   CODE STATUS: The patient is FULL CODE.  ____________________________ Vivien Presto, MD sa:drc D: 03/15/2012 09:48:25 ET T: 03/15/2012 10:13:02 ET JOB#: [id number]  cc: Vivien Presto, MD, <Dictator> Vivien Presto MD ELECTRONICALLY SIGNED 03/23/2012 19:27

## 2015-03-20 NOTE — Consult Note (Signed)
PATIENT NAME:  Kelly [name], Kelly [name] MR#:  [id number] DATE OF BIRTH:  1980-12-23  DATE OF CONSULTATION:  03/05/2012  REFERRING PHYSICIAN:  Cherre Huger, MD  CONSULTING PHYSICIAN:  Steva Colder. Nicolasa Ducking, MD  REASON FOR CONSULTATION: Suicidal thoughts.   IDENTIFYING INFORMATION: Kelly [name] is a 34 year old Caucasian female with a history of borderline intellectual functioning and a mood disorder as well as cognitive impairment, currently residing at [facility] since February 20th of this year. She has a legal guardian, Kelly [name].   HISTORY OF PRESENT ILLNESS: Kelly [name] is a 34 year old single African American female with a history of borderline intellectual functioning and a mood disorder as well as cognitive impairment and multiple medical problems including end-stage renal disease, diabetes, and hypertension, who was brought to [facility] after the patient developed a Kelly [name]-grade fever of 103 to 107 when in dialysis. The patient has been at [facility] since 01/16/2012 for mood stabilization and is currently awaiting placement in a group home. She is being transported to the [facility] area for dialysis three times a week from [facility]. Psychiatry was consulted secondary to the fact that the patient had endorsed some suicidal thoughts earlier yesterday on admission. During the interview, the patient's mood appeared to be at baseline. She was denying any active suicidal thoughts and stated that she had had suicidal thoughts of wanting to cut her wrist for the past four or five years. She denied any intent to harm herself and denied any worsening depressive symptoms. She denied feeling depressed currently and denies feelings of hopelessness or helplessness or problems with crying spells. The patient does have borderline intellectual functioning and was a poor historian. She knows that she is at a hospital in the [place] area and that her dialysis  catheter was possibly infected and has recently been removed today. She knows that she has been staying at [facility] and is awaiting placement in a group home. She denies any current psychotic symptoms, including auditory or visual hallucinations. No paranoid thoughts or delusions. She denies any homicidal thoughts. Affect was blunted. The patient was very concrete. The patient denied any intent to harm herself while here in the hospital. She denies any difficulty with insomnia and says her appetite is okay. She has had some nausea and abdominal pain but denies any vomiting. In addition, she complains of having headache, no other somatic complaints. Creatinine was 6.88 at the time of admission, and white blood cell count was 9.3. Blood cultures have shown no growth to date. Urinalysis showed 11 WBC, 1+ leukocyte esterase, and nitrite negative. The patient has recently had her catheter removed. Dr. Sheryle Hail, her psychiatrist from [facility], was contacted and stated that the patient's mood had been relatively stable there, and she was primarily awaiting placement in a group home.   PAST PSYCHIATRIC HISTORY: The patient has had multiple prior inpatient psychiatric hospitalizations at [facility] for a period of four months in 2012. She used to be followed by Dr. Rosine Door in [place] prior to going to [facility]. She has been at [facility] since 01/16/2012 under the care of Dr. Sheryle Hail. She does report a history of having tried to cut her wrists in the past.   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 No recent alcohol use or illicit drug use, although she does endorse  a history of cocaine abuse in the past. She does smoke 1 to 2 cigarettes per day at [facility].   FAMILY PSYCHIATRIC HISTORY: The patient does not know whether  or not anybody in her family has history of mental illness or substance use.   PAST MEDICAL HISTORY:  1. End-stage renal disease, on dialysis Monday, Wednesday, Friday.  2. Hypertension.  3. Hyperlipidemia.  4. Diabetes.  5. History of secondary hyperparathyroidism.  6. History of cholecystectomy.  7. Borderline intellectual functioning. 8. Left AV fistula. 9. 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    OUTPATIENT MEDICATIONS PRIOR TO ADMISSION:  1. Depakote 1000 mg p.o. nightly.  2. Lantus 16 units once a day.  3. Seroquel XR 300 mg at bedtime.  4. Sensipar 30 mg p.o. daily.  5. Humalog sliding scale insulin.  6. Lisinopril 5 mg p.o. daily. 7. Folic acid 1 mg p.o. daily.  8. Vitamin B12 1000 mcg p.o. daily. 9. Aspirin 81 mg p.o. daily. 10. Trazodone 100 mg p.o. daily. 11. Colace 100 mg p.o. b.i.d.  12. Valium 10 mg as needed Monday, Wednesday, Friday and one hour prior to dialysis. 13. MiraLax 17 grams as needed for constipation.  14. Renvela 800 mg p.o. t.i.d. with meals.  15. Tylenol p.r.n.   ALLERGIES: No known drug allergies.   SOCIAL HISTORY: The patient currently has a legal guardian and has been living in group homes for several years. She says she was born and raised in the [place] area by her mother primarily. She has never been married. She says that she had a baby that was stillborn. Her legal guardian was her aunt, Kelly [name], [phone].   LEGAL HISTORY: No current pending charges.   MENTAL STATUS EXAM: Kelly [name] is a 34 year old obese African American female who is lying in her hospital bed in a hospital gown. She had a bandage where a catheter was recently removed. The patient was fully alert and oriented to place and situation. She knew it was the fourth month of the year, and it was 2013, but could not name the day of the week. She believed that [place] was [employer]. Speech was slow and soft but fluent and coherent. Mood was described  as being "okay" and affect was blunted. She denied any current active or passive suicidal thoughts or psychotic symptoms. She denied any auditory or visual hallucinations. She denied any paranoid thoughts or delusions. Attention and concentration were poor. Judgment and insight were poor. She did not want to answer questions with regards to memory and recall.   SUICIDE RISK ASSESSMENT: The patient will remain at a chronic risk of harm to self and others secondary to history of mood instability and a long history of noncompliance with medical treatment.   REVIEW OF SYSTEMS: CONSTITUTIONAL: She did complain of a recent fever and fatigue. No weight changes. HEAD: She did complain of a headache today. No dizziness. EYES: She denies any blurred vision or double vision. ENT: She denies any tinnitus, ear pain, or difficulty hearing. RESPIRATORY: She denies any cough or shortness of breath. CARDIOVASCULAR: She denies any chest pain or palpitations.  She denies any syncopal episodes. GI: She does complain of some nausea, but no vomiting or abdominal pain, no change in bowel movements. GENITOURINARY: She denies any dysuria or hematuria. ENDOCRINE: She denies any polyuria or nocturia. HEMATOLOGIC: She denies any anemia or easy bruising. MUSCULOSKELETAL: She denies any muscle pain or joint pain. She denies any swelling or gout. NEUROLOGICAL:  She denies any numbness or weakness. She denies any sensation changes. PSYCHIATRIC: Please see the history of present illness.   PHYSICAL EXAMINATION:  VITAL SIGNS: Blood pressure 127/82, heart rate 88, respirations 19, temperature 97.4. Glucose 244. Please see initial physical exam as completed by Dr. Cherre Huger.   LABORATORY, DIAGNOSTIC AND RADIOLOGICAL DATA:  Glucose 188, sodium 136, potassium 4.6, chloride 99, CO2 26, BUN 31, creatinine 6.88, glucose 310.  Alkaline phosphatase 199, AST 31, ALT 17. Depakote level pending.  CK 55. Troponin less than 0.02. Blood cultures  show no growth to date. Urinalysis was 1+ leukocyte esterase, nitrite negative, 11 WBC, trace bacteria.  Lactic acid was elevated at 3.1.  CT of the sinuses showed minimal right maxillary sinus mucosal thickening, otherwise unremarkable.  Chest x-ray showed no acute process.   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   AXIS II: Borderline intellectual functioning.   AXIS III:  1. End-stage renal disease. 2. Diabetes. 3. Hypertension.   AXIS IV: Severe-inability to care for self independently, poor compliance.   AXIS V: Global Assessment of Functioning score is at present equals 40.   ASSESSMENT AND TREATMENT RECOMMENDATIONS: Ms. Schomberg is a 34 year old single African American female with a history of a mood disorder and borderline intellectual functioning as well as some cognitive impairment related to a prior diabetic coma. She had made a statement yesterday about wanting to cut her wrist, and therefore a Psychiatry consult was placed. The patient says that she has had suicidal thoughts on and off for the past 10 years of wanting to cut her wrist but has no intent to harm herself and denies any current active suicidal thoughts. The patient is currently an inpatient at [facility] since 01/16/2012. Her psychiatrist at [facility], Dr. Sheryle Hail, welcomes her back to the facility once she is medically cleared and stable. At this time we will not make any psychotropic medication changes as Dr. Sheryle Hail has reported that her mood has been very stable at the facility. She is denying feeling depressed or actively suicidal currently. The patient is at her baseline, and mental status has actually significantly improved since prior to her going to [facility] on February 20th. We will keep with a one-to-one sitter and under IVC at this time until the patient is transferred back safely to [facility]. We will check Depakote level in the a.m.  Please feel free to consult or call this writer with any questions. The case was discussed with Dr. Holley Raring. When the patient does return to [facility], she will need to go under IVC with Sheriff.  ____________________________ Steva Colder. Nicolasa Ducking, MD akk:[id number] D: 03/05/2012 17:29:25 ET T: 03/05/2012 18:03:08 ET JOB#: [id number]  cc: Aarti K. Nicolasa Ducking, MD, <Dictator> Chauncey Mann MD ELECTRONICALLY SIGNED 03/05/2012 18:48
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
## 2015-03-20 NOTE — H&P (Signed)
PATIENT NAME:  Kelly [name], Kelly [name] MR#:  [id number] DATE OF BIRTH:  07-22-1981  DATE OF ADMISSION:  03/04/2012  REFERRING PHYSICIAN: ER physician, Dr. Cinda Quest PRIMARY CARE PHYSICIAN: Dr. Brunetta Genera  CHIEF COMPLAINT: Fever.   HISTORY OF PRESENT ILLNESS: Patient is a 34 year old African American female with past medical history of mood disorder, borderline intellectual function, diabetes, hypertension who usually gets dialyzed Monday, Wednesday and Friday. She came in for additional treatment of dialysis today because enough fluid was not removed on yesterday's dialysis treatment. In dialysis she was found to have high-grade fever of 103 to 107. Patient reports that her left chest dialysis catheter has been hurting for some time. As per the patient's caregiver she was scheduled to have a fistula placed tomorrow. Patient denies any other complaints. Her last admission to [facility] was in [place] unit. Currently she is in [facility] in [place] which is a state psychiatric hospital since February 2013. Patient expressed suicidal ideation to the ED R.N., however, currently she denies any suicidal or homicidal ideation. As per her caregiver the patient at [facility] is checked upon every 15 minutes.   ALLERGIES: No known drug allergies.   PAST MEDICAL HISTORY:  1. Diabetes. 2. Hypertension. 3. Hyperlipidemia. 4. History of brain injury. 5. Secondary hyperparathyroidism.  6. End-stage renal disease and dialysis Monday, Wednesday and Friday.  7. Mood disorder. 8. Borderline intellectual function. 9. Cognitive impairment.   PAST SURGICAL HISTORY:  1. Cholecystectomy.  2. History of fistulas in the past. 3. History of left AV fistula in the past.   CURRENT MEDICATIONS:  1. Depakote 500 mg 2 tablets once a day.  2. Lantus 16 units once a day. 3. Seroquel XR 150 mg 2 tablets once a day.  4. Sensipar 30 mg daily.  5. Humalog sliding scale. 6. Lisinopril 5 mg daily.   7. Folic acid 1 mg daily.  8. Vitamin B12 1000 mcg daily.  9. Aspirin 81 mg daily.  10. Trazodone 100 mg daily.  11. Colace 100 mg b.i.d.  12. Valium 10 mg as needed on Monday, Wednesday and Friday one hour prior to dialysis.  13. MiraLax 17 grams daily as needed for constipation.  14. Valium 10 mg intramuscularly once if patient refuses oral Valium prior to dialysis. 15. Renvela 800 mg t.i.d. with meals.  16. Tylenol p.r.n.   SOCIAL HISTORY: Patient smokes two cigarettes per day at [facility]. Prior to that she was smoking one pack per day. She was an alcoholic but quit 1 to 2 years ago. She used to snort cocaine but has not done so for a while. She was at a resident of a group home prior to being hospitalized at [facility].   FAMILY HISTORY: Mother has diabetes. Patient is not aware of any health has problems with the father.   REVIEW OF SYSTEMS: CONSTITUTIONAL: Patient reports fever and fatigue. EYES: Denies any blurred or double vision. ENT: Denies any tinnitus, ear pain. RESPIRATORY: Denies cough, wheezing. CARDIOVASCULAR: Denies any chest pain, palpitations. GASTROINTESTINAL: Denies any nausea, vomiting, diarrhea, abdominal pain. GENITOURINARY: Denies in dysuria, hematuria. ENDOCRINE: Denies any polyuria, nocturia. HEME/LYMPH: Has anemia of chronic disease. Denies any easy bruisability. INTEGUMENT: Denies any acne, rash. MUSCULOSKELETAL: Denies any swelling, gout. NEUROLOGICAL: Denies any numbness, weakness. PSYCH: Has extensive psychiatric problems including suicidal ideation in the past, mood disorder.    PHYSICAL EXAMINATION:  VITAL SIGNS: Temperature 100.9, as per the patient in dialysis her fever was 103, heart rate 126, respiratory  rate 20, blood pressure 109/55, pulse ox 98% on room air.   GENERAL: Patient is an obese [place] American female with a flat affect sitting comfortably in bed, not in acute distress.   HEAD: Atraumatic, normocephalic.    EYES: There is pallor. No icterus or cyanosis. Pupils are equal, round, and reactive to light and accommodation. Extraocular movements intact.   ENT: Wet mucous membranes. No oropharyngeal erythema or thrush.   NECK: Supple. No masses. No JVD. No thyromegaly.   CHEST WALL: No tenderness to palpation. Not using accessory muscles of respiration. No intercostal muscle retractions. Left chest dialysis catheter. Patient reports subjective pain, however, on exam there is no surrounding tenderness, erythema or discharge.   LUNGS: Bilaterally clear to auscultation. No wheezing, rales, or rhonchi.   CARDIOVASCULAR: S1, S2 regular. No murmurs, rales or gallops.   ABDOMEN: Soft, nontender, nondistended. No guarding. No rigidity. No organomegaly. Normal bowel sounds.   SKIN: No rashes or lesions.   PERIPHERIES: There is some pedal edema, 1 to 2+ pedal pulses.   MUSCULOSKELETAL: No cyanosis or clubbing.   NEUROLOGIC: Awake, alert, oriented x3. Nonfocal neurological exam. Cranial nerves grossly intact.   PSYCH: Flat affect.   LABORATORY, DIAGNOSTIC, AND RADIOLOGICAL DATA: CT sinuses shows mild right maxillary sinus thickening, otherwise unremarkable. Lactic acid 2.1, white count 9.3, hemoglobin 11.7, hematocrit 35.7, platelets 138, glucose 310, BUN 31, creatinine 6.88, normal electrolytes. Alkaline phosphatase 199, remaining profile normal. Chest x-ray showed no acute cardiopulmonary pathology.   ASSESSMENT AND PLAN: 34 year old female with history of diabetes, hypertension, end-stage renal disease on dialysis presents with fever.  1. Sepsis with high grade fever and tachycardia. Patient does not make a lot of urine. Will obtain blood cultures. Start her on empiric antibiotics, vancomycin and Zosyn until her final culture results are available. Patient complains of pain in the region of her left chest dialysis catheter, however, there is no evidence of any infection at that site. She may, however,  need that catheter to be replaced if she is found to have staph  bacteremia.  2. Anemia, likely of chronic kidney disease, appears to be stable at present.  3. Diabetes. Accu-Cheks are elevated. Place her on insulin sliding scale and check a hemoglobin A1c. Continue her Lantus. Place on a diabetic diet. Will adjust medications to achieve good glycemic control.  4. End-stage renal disease on dialysis. Will obtain a nephrology consultation. Patient gets dialyzed Monday, Wednesday and Friday. She had an extra dialysis treatment today because enough fluid was not taken off during the treatment yesterday. 5. Smoking. Patient was counseled about cessation for more than three minutes. Patient is not interested in quitting at this time.  6. Extensive psychiatric history with mood disorder, borderline intellectual function and cognitive impairment. Patient is currently admitted to [facility] psychiatric hospital in [place]. She had expressed some suicidal ideations to the ED nurse. Will place her on suicidal precautions. Get a sitter. Also get a psychiatric evaluation.  7. Will place on GI and DVT prophylaxis.   Reviewed all medical records, discussed with the patient the plan of care and management.   TIME SPENT: 75 minutes.   ____________________________ Cherre Huger, MD sp:cms D: 03/04/2012 20:39:33 ET T: 03/05/2012 06:24:53 ET  JOB#: [id number] cc: Cherre Huger, MD, <Dictator> Meindert A. Brunetta Genera, MD Cherre Huger MD ELECTRONICALLY SIGNED 03/07/2012 17:49

## 2015-03-20 NOTE — Op Note (Signed)
PATIENT NAME:  Kelly [name], GROGG MR#:  [id number] DATE OF BIRTH:  1980/11/29  DATE OF PROCEDURE:  03/05/2012  PREOPERATIVE DIAGNOSIS: Catheter related sepsis.   POSTOPERATIVE DIAGNOSIS: Catheter related sepsis.   PROCEDURE PERFORMED: Removal of left IJ cuffed tunneled dialysis catheter.   SURGEON: Katha Cabal, MD  DESCRIPTION OF PROCEDURE: Patient is in the special procedures holding area. She is positioned supine. Her left chest wall and catheter are prepped and draped in sterile fashion. 1% lidocaine is infiltrated around the soft tissues. 11 blade scalpel was used to make a small incision. The cuff is exposed. Using sharp scissors the cuff is freed from the surrounding tissues and the catheter is removed, pressure is held. Antibiotic ointment is then placed at the exit site and a sterile dressing is applied. The patient tolerated procedure well and there were no immediate complications.   ____________________________ Katha Cabal, MD ggs:cms D: 03/05/2012 17:09:00 ET T: 03/06/2012 10:10:09 ET JOB#: [id number]  cc: Katha Cabal, MD, <Dictator>  Katha Cabal MD ELECTRONICALLY SIGNED 03/06/2012 14:28

## 2015-03-20 NOTE — Consult Note (Signed)
General Aspect 34 -year-old female with history of mood disorder, borderline intellectual functioning cognitive impairment with history of renal failure on dialysis, brought to [facility] ER for agitation.  Patient's been somewhat  uncooperative  since she's been at the hospital.  Cardiology was asked to consult regarding prolonged QT and inability to give other antipsychotics that might improve patient's behavior.  On review of patient's medications from her care home she is on at least six drugs that could prolong QT, Haldol citalopram, trazodone, quintipine and Prolixin.   In addition, it is unclear if patient had a  full or partial dialysis on Monday due to behavioral issues and Missed dialysis yesterday. The combination of drugs with missed dialysis dates may be contributing to the QT changes on the EKG. EKG does show sinus tachycardia 106 bpm possible left Atrial enlargement with left ventricular hypertrophy with a QTC of 520 ms    Present Illness The patient was unable to be examined due to sleeping while present in the unit and the staff reports she's been very noncommunicative today when awake.   Physical Exam:   GEN sleeping   General Ref:  06-Feb-13 15:05    Acetaminophen, Serum < 2  Routine Chem:  06-Feb-13 15:05    Glucose, Serum 128   BUN 30   Creatinine (comp) 11.91   Sodium, Serum 138   Potassium, Serum 4.1   Chloride, Serum 99   CO2, Serum 26   Calcium (Total), Serum 9.3  Hepatic:  06-Feb-13 15:05    Bilirubin, Total 0.4   Alkaline Phosphatase 218   SGPT (ALT) 48   SGOT (AST) 45   Total Protein, Serum 7.7   Albumin, Serum 3.4  Routine Chem:  06-Feb-13 15:05    Osmolality (calc) 284   eGFR (African American) 5   eGFR (Non-African American) 4   Anion Gap 13   Ethanol, S. < 3   Ethanol % (comp) < 0.003  Routine Hem:  06-Feb-13 15:05    WBC (CBC) -   RBC (CBC) -   Hemoglobin (CBC) -   Hematocrit (CBC) -   Platelet Count (CBC) -   MCV -   MCH -   MCHC -   RDW -   General Ref:  72-IOM-[id number] 59:74    Salicylates, Serum < 1.7  Thyroid:  06-Feb-13 15:05    Thyroid Stimulating Hormone 2.78  Urine Drugs:  16-LAG-[id number] 64:68    Tricyclic Antidepressant, Ur Qual (comp) POSITIVE   Amphetamines, Urine Qual. NEGATIVE   MDMA, Urine Qual. NEGATIVE   Cocaine Metabolite, Urine Qual. NEGATIVE   Opiate, Urine qual NEGATIVE   Phencyclidine, Urine Qual. NEGATIVE   Cannabinoid, Urine Qual. NEGATIVE   Barbiturates, Urine Qual. NEGATIVE   Benzodiazepine, Urine Qual. POSITIVE   Methadone, Urine Qual. NEGATIVE  Routine UA:  06-Feb-13 15:05    Color (UA) Yellow   Clarity (UA) Hazy   Glucose (UA) 50 mg/dL   Bilirubin (UA) Negative   Ketones (UA) Negative   Specific Gravity (UA) 1.014   Blood (UA) Negative   pH (UA) 7.0   Nitrite (UA) Negative   Leukocyte Esterase (UA) Trace   RBC (UA) <1 /HPF   WBC (UA) 9 /HPF   Bacteria (UA) TRACE  Routine Sero:  06-Feb-13 15:05    Pregnancy Test, Urine NEGATIVE  TDMs:  06-Feb-13 15:05    Valproic Acid, Serum 4    No Known Allergies:     Impression QT prolongation in a patient with polypharmacy with  multiple medications with the potential of prolonging   QT, in the setting of renal failure with missed dialysis.    Plan In an attempt to shorten the QT interval, drugs that are known to prolong the QT should be minimized and patient should be dialyzed as soon as possible.   Patient was seen in collaboration with Dr. Nehemiah Massed, who agrees with the above plan.   Electronic Signatures: Roderic Palau (NP)  (Signed 07-Feb-13 15:35)  Authored: General Aspect/Present Illness, History and Physical Exam, Labs, Allergies, Impression/Plan   Last Updated: 07-Feb-13 15:35 by Roderic Palau (NP)

## 2015-03-20 NOTE — Op Note (Signed)
PATIENT NAME:  Kelly [name], Kelly [name] MR#:  [id number] DATE OF BIRTH:  [birth year]  DATE OF PROCEDURE:  03/13/2012  PREOPERATIVE DIAGNOSES:  1. End-stage renal disease.  2. Recent PermCath infection with subsequent PermCath removal.  3. Hypertension.  4. Stroke.  5. Diabetes.   POSTOPERATIVE DIAGNOSES:  1. End-stage renal disease.  2. Recent PermCath infection with subsequent PermCath removal.  3. Hypertension.  4. Stroke.  5. Diabetes.   PROCEDURES:  1. Ultrasound guidance for vascular access, left jugular vein.  2. Fluoroscopic guidance for placement of catheter.  3. Placement of 23 cm tip-to-cuff tunneled hemodialysis catheter.   SURGEON: Algernon Huxley, M.D.   ANESTHESIA: Local with moderate conscious sedation.   ESTIMATED BLOOD LOSS: Approximately 25 mL.   INDICATION FOR PROCEDURE: This is a 34 year old African American female with end-stage renal disease. She had her PermCath removed for infection. She has cleared this infection and she has had a temporary line for dialysis for several treatments. She now needs a more durable type of access. PermCath is planned.   DESCRIPTION OF PROCEDURE: The patient was brought to the vascular interventional radiology suite. Initially her right neck and chest were sterilely prepped and draped and sterile surgical field was created. When ultrasound was brought onto the field, the jugular vein was found to be small and sclerotic and did not appear to be patent. I did not attempt to access on the right. I stopped and reprepped and draped the left neck. The left jugular vein was patent and was accessed without difficulty under direct ultrasound guidance with a Seldinger needle and permanent image was recorded. A J-wire was placed. After skin nick and dilatation, the peel-away sheath was placed over the wire and the wire and dilator were removed. I then tunneled to an area under the left clavicle and tunneled from the subclavicular incision to the access site  using fluoroscopic guidance and selected a 23 cm tip-to-cuff tunneled hemodialysis catheter. This was then placed through the peel-away sheath and the peel-away sheath was removed. The catheter tip was placed into the right atrium. The appropriate distal connectors were placed. It withdrew blood well and flushed easily with heparinized saline and then a concentrated heparin solution was placed. It was secured to the chest wall with two Prolene sutures. A 4-0 Monocryl  pursestring suture was placed at the exit site, and a 4-0 suture was used to close the access incision. A sterile dressing was placed. The patient tolerated the procedure well and was taken to the recovery room in stable condition.  ____________________________ Algernon Huxley, MD jsd:slb D: 03/13/2012 14:46:17 ET T: 03/13/2012 17:22:44 ET JOB#: [id number]  cc: Algernon Huxley, MD, <Dictator> Algernon Huxley MD ELECTRONICALLY SIGNED 03/17/2012 9:55

## 2015-03-20 NOTE — Op Note (Signed)
PATIENT NAME:  Kelly [name], Kelly [name] MR#:  [id number] DATE OF BIRTH:  [birth year]  DATE OF PROCEDURE:  12/13/2011  PREOPERATIVE DIAGNOSES:  1. Concern for instability of hemodialysis catheter.  2. Endstage renal disease.  3. Borderline mental functioning/mental retardation.   POSTOPERATIVE DIAGNOSES:  1. Concern for instability of hemodialysis catheter.  2. Endstage renal disease.  3. Borderline mental functioning/mental retardation.   PROCEDURE: Securing the catheter to the chest wall with 2 Prolene sutures.   SURGEON: Algernon Huxley, M.D.   ANESTHESIA: Local.   ESTIMATED BLOOD LOSS: None.  FLUOROSCOPY: None.   CONTRAST USED: None.   INDICATION FOR PROCEDURE: 34 year old female who was sent over by the dialysis center due to concern for instability of her dialysis catheter.   DESCRIPTION OF PROCEDURE: The patient arrived at our vascular interventional radiology suite. Her catheter did not appear infected. The cuff was not exposed. The external sutures to the skin had come out and so I replaced these with 2 Prolene sutures. The catheter itself did not need to be exchanged or removed.       She is apparently on the schedule in the near future for a permanent access.   ____________________________ Algernon Huxley, MD jsd:bjt D: 12/13/2011 12:01:48 ET T: 12/13/2011 12:28:10 ET JOB#: [id number]  cc: Algernon Huxley, MD, <Dictator> Algernon Huxley MD ELECTRONICALLY SIGNED 01/09/2012 11:11

## 2015-03-20 NOTE — Op Note (Signed)
PATIENT NAME:  Kelly [name], Kelly [name] MR#:  [id number] DATE OF BIRTH:  09/02/1981  DATE OF PROCEDURE:  05/06/2012  PREOPERATIVE DIAGNOSES:  1. Complication AV dialysis device. 2. End-stage renal disease.  POSTOPERATIVE DIAGNOSES:  1. Complication AV dialysis device.  2. End-stage renal disease.   PROCEDURE PERFORMED: Removal of cuffed tunneled left IJ dialysis catheter.   SURGEON: Katha Cabal, MD   PROCEDURE: The patient is brought to the Special Procedures holding area and placed in the supine position. The chest wall and catheter were prepped and draped in a sterile fashion. 1% lidocaine is infiltrated over the palpable cuff. Small incision is created and the cuff is exposed, dissected circumferentially, and subsequently the catheter is removed without difficulty. Light pressure is held at the base of the neck for several minutes. The tract is then closed with a purse-string suture of 4-0 Monocryl and the skin is closed with a single interrupted 4-0 Monocryl subcuticular. Dermabond is applied. Antibiotic ointment is placed at the exit site and a sterile dressing. There are no immediate complications.   ____________________________ Katha Cabal, MD ggs:drc D: 05/06/2012 11:33:45 ET T: 05/06/2012 12:23:18 ET JOB#: [id number]  cc: Katha Cabal, MD, <Dictator> Katha Cabal MD ELECTRONICALLY SIGNED 05/22/2012 10:01

## 2015-03-20 NOTE — Op Note (Signed)
PATIENT NAME:  Kelly [name], Kelly [name] MR#:  [id number] DATE OF BIRTH:  July 31, 1981  DATE OF PROCEDURE:  06/03/2012  PREOPERATIVE DIAGNOSES:  1. Complication arteriovenous dialysis graft with prolonged bleeding following decannulation.  2. End-stage renal disease requiring hemodialysis.   POSTOPERATIVE DIAGNOSES:  1. Complication arteriovenous dialysis graft with prolonged bleeding following decannulation.  2. End-stage renal disease requiring hemodialysis.   PROCEDURES PERFORMED:  1. Contrast injection left arm brachial axillary dialysis graft.  2. Percutaneous transluminal angioplasty of left arm venous anastomosis AV graft.  3. Placement of a FLAIR 8 x 50 stent for failed angioplasty.   SURGEON: Hortencia Pilar, MD   SEDATION: Versed 2 mg plus fentanyl 50 mcg administered via the AV graft. Continuous ECG, pulse oximetry and cardiopulmonary monitoring is performed throughout the entire procedure by the interventional radiology nurse. Total sedation time was 45 minutes.   ACCESS: A 7-French sheath, antegrade direction, left arm brachial axillary dialysis graft.   CONTRAST USED: Isovue 22 mL.   FLUOROSCOPY TIME: 2.6 minutes.   INDICATIONS: Ms. Scanlon is a 34 year old woman maintained on hemodialysis who has been having increasing problems with her dialysis access. The risks and benefits for angiography and intervention were reviewed. All questions are answered. The patient has agreed to proceed. The guardian was contacted by phone, and the guardian consents as well.   DESCRIPTION OF PROCEDURE: The patient is taken to Special Procedures and placed in the supine position with her left arm extended palm upward. The left arm was prepped and draped in sterile fashion. One percent lidocaine is infiltrated in the soft tissue near the arterial anastomosis, and access to the AV graft is obtained in an antegrade direction with a microneedle, microwire followed by micro sheath, J-wire followed by a 6-French  sheath. Versed  2 mg plus 50 mcg of fentanyl are administered via the graft. Hand injection of contrast is then used to demonstrate the graft as well as the central veins. After review of the images, there is an 80% to 85% stenosis at the venous anastomosis. Four thousand units of heparin is given again via the graft and allowed to circulate. A 7 x 40 balloon is then advanced across the venous anastomosis and inflated to 28 atmospheres for one minute. Follow-up angiography demonstrates a greater than 50% residual stenosis, and therefore a FLAIR stent will be deployed for failed angioplasty. An 8 x 50 FLAIR stent has been opened onto the field. The FLAIR stent is advanced over the Magic torque wire and deployed without difficulty. It is then post dilated with an 8 x 40 Rival balloon to 16 atmospheres. Residual waist is still noted at the anastomotic site, and therefore an 8 x 2 Conquest balloon is advanced across this area specifically and inflated to 28 atmospheres. This completely irons out and then fully expands the FLAIR stent.   Follow-up angiography now demonstrates rapid flow of contrast through the graft and through the venous anastomosis into the axillary vein. A pursestring suture is placed around the 7 French sheath, and the wire and sheath are removed. Light pressure is held. There are no immediate complications.   INTERPRETATION: Initial views of the AV graft demonstrate the graft is in good repair. No significant pseudoaneurysms noted. No intragraft stenoses are identified. At the venous anastomosis, there is 80% to 85% stenosis. Central veins are widely patent.   Following angioplasty, there is greater than 50% residual stenosis and, therefore, FLAIR stent is deployed across this area postdilated to 8 with an excellent result.  SUMMARY: Successful salvage of left arm brachial axillary dialysis graft.  ____________________________ Katha Cabal, MD ggs:cbb D: 06/03/2012 12:11:50  ET T: 06/03/2012 12:52:11 ET JOB#: [id number]  cc: Katha Cabal, MD, <Dictator> Meindert A. Brunetta Genera, MD Murlean Iba, MD Katha Cabal MD ELECTRONICALLY SIGNED 06/17/2012 12:58

## 2015-03-20 NOTE — Op Note (Signed)
PATIENT NAME:  Kelly [name], Kelly [name] MR#:  [id number] DATE OF BIRTH:  03-19-1981  DATE OF PROCEDURE:  03/14/2012  PREOPERATIVE DIAGNOSES:  1. End-stage renal disease requiring hemodialysis.  2. Complication AV dialysis device.   POSTOPERATIVE DIAGNOSES:  1. End-stage renal disease requiring hemodialysis.  2. Complication AV dialysis device.   PROCEDURE PERFORMED: Creation of left arm brachial axillary dialysis graft.  PROCEDURE PERFORMED BY: Katha Cabal, MD   ANESTHESIA: General by LMA.   FLUIDS: Per anesthesia record.   ESTIMATED BLOOD LOSS: 75 mL.   SPECIMEN: None.   INDICATIONS: Ms. Ruthven is a 34 year old woman who presented to the hospital with catheter-related sepsis. This has been treated and her blood cultures are negative. She is now undergoing placement of a new tunneled dialysis catheter but is also undergoing placement of her arm permanent access while she is here. The risks and benefits were reviewed with her guardian. All questions were answered and the guardian has agreed to proceed.   PROCEDURE: The patient is taken to the operating room and placed in the supine position. After adequate general anesthesia is induced, appropriate invasive monitors are placed. She is positioned supine with her left arm extended palm upward. The left arm is prepped and draped in a sterile fashion.   A linear incision is created just above the antecubital crease and the dissection is carried down through the soft tissues opening the fascia and exposing the brachial artery. Brachial artery is looped proximally and distally.   A linear incision is created in the anterior axillary line and the dissection is carried down, again opening transecting subcutaneous tissues, opening the fascia and identifying the axillary vein which is looped proximally and distally. A third posterior branch is also looped with a silastic vessel loop.   A 4 to 7 mm tapered Propatent PTFE graft is opened onto the field  and then using the curved Gore tunneler pulled subcutaneously.   The brachial artery is then occluded proximally and distally with vascular clamps, opened with an 11 blade and extended with Potts scissors. The 4 mm taper is beveled in an end graft to side brachial artery anastomosis fashion using CV-6 Gore suture. Flushing maneuvers are performed and flow is re-established distally to the hand. Sponge is packed around the suture line.   With the vein in its native bed, the venous end of the graft is approximated to the vein and then beveled to the appropriate length. The vein is then delivered into the surgical field using silastic vessel loops for control. Venotomy is created, extended with Potts scissors, and an end graft to side vein anastomosis is fashioned with running CV-6 suture. Flushing maneuvers are performed before completing the suture line. Suture line is completed and flow is established through the graft. Palpable thrill is identified.   Both wounds are then packed with gauze and light pressure held for several minutes following which suture lines demonstrate complete hemostasis. Surgicel is placed around the suture lines of both the arterial and venous anastomoses and the wounds are both closed in layers using interrupted 3-0 Vicryl followed by running 3-0 Vicryl followed by 4-0 Monocryl subcuticular and Dermabond. The patient tolerated the procedure well and there were no immediate complications. Sponge and needle counts were correct. She was taken to the recovery area in stable condition.    ____________________________ Katha Cabal, MD ggs:drc D: 03/14/2012 15:15:07 ET T: 03/15/2012 09:45:17 ET JOB#: [id number]  cc: Katha Cabal, MD, <Dictator> [place] Lilian Kapur, MD Meindert A.  Brunetta Genera, [place] MD ELECTRONICALLY SIGNED 03/16/2012 13:55

## 2015-03-27 NOTE — Op Note (Signed)
PATIENT NAME:  Kelly [name], Kelly [name] MR#:  [id number] DATE OF BIRTH:  05-06-1981  DATE OF PROCEDURE:  12/10/2014  PREOPERATIVE DIAGNOSES:  1.  Complication of dialysis device with nonfunction of the dialysis catheter.  2.  End-stage renal disease requiring hemodialysis.  3.  Superior vena cava syndrome.  4.  Schizoaffective disorder with bipolar disorder and associated depression.   POSTOPERATIVE DIAGNOSES:  1.  Complication of dialysis device with nonfunction of the dialysis catheter.  2.  End-stage renal disease requiring hemodialysis.  3.  Superior vena cava syndrome.  4.  Schizoaffective disorder with bipolar disorder and associated depression.   PROCEDURE PERFORMED:  Exchange of cuffed tunneled dialysis catheter, same venous access.   SURGEON: Hortencia Pilar, MD.   FLUOROSCOPY TIME: Less than 2 minutes.   CONTRAST USED: None.   INDICATIONS: [name] is a 34 year old woman who was found to have problems with her catheter at dialysis today, she has missed dialysis on Wednesday and therefore has not had dialysis for nearly 5 days. It is under these circumstances that catheter exchange is being performed rather than TPA infusion. Emergency consent has been obtained from the nephrology and medical colleagues under these circumstances. The patient's guardian is unavailable and does not return phone calls.   DESCRIPTION OF PROCEDURE: The patient is taken to special procedures and placed in the supine position. After adequate sedation is achieved right neck and chest wall are prepped and draped in a sterile fashion. The cuff is noted by palpation and 1% lidocaine is infiltrated in the soft tissues. A small transverse incision is created and the dissection is carried down to expose the cuff.  The cuff is freed from the surrounding tissues. The catheter is then transected and an Amplatz Super Stiff wire is advanced under direct fluoroscopic visualization down into the inferior vena cava. The catheter  is then removed in 2 separate pieces. A 19 cm tip to cuff DuraMax catheter is then prepped on the back table. A new exit site is selected, anesthetized with 1% lidocaine. A small incision is made and a dilator is passed subcutaneously and the wire subsequently fed from the initial incision through the dilator out the exit site. Dilator and peel-away sheath are then inserted up to the level of the neck and the DuraMax catheter is then advanced over the wire through the peel-away sheath. The peel-away sheath is removed.  DuraMax catheter is positioned so that the tip is in the mid atrium. Both lumens aspirate easily and flush well and the catheter is then packed with 5000 units of heparin after verifying under fluoroscopy that it has a smooth contour. The initial incision is closed with 4-0 Monocryl subcuticular and Dermabond. Catheter is secured to the chest wall with 0 silk and a sterile dressing is applied. The patient tolerated the procedure well and there were no immediate complications.     ____________________________ Katha Cabal, MD ggs:bu D: 12/13/2014 17:39:03 ET T: 12/13/2014 18:10:54 ET JOB#: [id number]  cc: Katha Cabal, MD, <Dictator> [place] Lilian Kapur, MD Katha Cabal MD ELECTRONICALLY SIGNED 12/29/2014 17:43

## 2015-03-27 NOTE — Discharge Summary (Signed)
PATIENT NAME:  Kelly [name], Kelly [name] MR#:  [id number] DATE OF BIRTH:  [birth year]  DATE OF ADMISSION:  12/10/2014 DATE OF DISCHARGE:  12/14/2014  ADMITTING PHYSICIAN: Fritzi Mandes, M.D.   DISCHARGING PHYSICIAN:  Gladstone Lighter, MD   PRIMARY NEPHROLOGIST:  Anthonette Legato, MD    [place]:   1.  Vascular consultation with Dr. Delana Meyer.  2.  Nephrology consultation by Dr. Inocente Salles and Dr. Candiss Norse.  3.  Infectious Disease consultation by Dr. Ola Spurr.   DISCHARGE DIAGNOSES:  1.  Clotted dialysis access, clotted Permacath status post change of Permacath over the guidewire.  2.  Sepsis secondary to dental infection. Blood cultures negative.  3.  History of mental retardation.  4.  End-stage renal disease on Monday, Wednesday, Friday hemodialysis.  5.  Bipolar and schizoaffective disorder.  6.  Hypertension.  7.  Noncompliance with medications.  8.  Hyperlipidemia.  9.  Anxiety.  10. Traumatic brain injury.  11. Type 2 diabetes mellitus.  12.  Anemia of chronic disease.  DISCHARGE HOME MEDICATIONS:  1.  Hydroxy progesterone 150 mg per mL intramuscular suspension 1 mL intramuscular every 3 months for contraception. 2.  Seroquel 200 mg p.o. b.i.d.  3.  Lantus 20 units subcutaneous at bedtime.  4.  Lexapro 15 mg p.o. daily.  5.  Aspirin 81 mg p.o. daily.  6.  Multivitamin 1 tablet p.o. daily.  7.  Folic acid 437 mg p.o. at bedtime.  8.  Clonidine 0.2 mg p.o. b.i.d.  9.  Nystatin triamcinolone cream topically apply twice a day.  10. Calcium acetate 667 mg capsule 2 capsules twice a day with meals.  11. Metoprolol 50 mg p.o. b.i.d.  12. Hydralazine 50 mg p.o. 3 times a day.  13. Sensipar 30 mg p.o. at bedtime.  14. Trazodone 100 mg p.o. at bedtime.  15. Depakote 1000 mg p.o. daily.  16. Minerin cream to affected area once a day at bedtime as needed.  17. Vitamin B12 at 1000 mcg p.o. daily at bedtime.  18. Colace 100 mg p.o. daily at bedtime.  19. Norvasc 10 mg p.o. at  bedtime.  20. MiraLax powder for constipation p.r.n.  21. Nasal spray Deep Sea 0.65% 2 sprays each nostril twice a day.  22. Glucagon kit 1 mg injectable as needed for hypoglycemia.  23. Augmentin 500 mg p.o. daily for 9 more days.   DISCHARGE DIET: Renal diet.   DISCHARGE ACTIVITY: As tolerated.   FOLLOWUP INSTRUCTIONS:  1.  Dentist follow-up in one week.  2.  Dialysis, as per schedule tomorrow.  3.  Blood cultures for dialysis if further fevers.  LABORATORIES AND IMAGING STUDIES PRIOR TO DISCHARGE:  HIV test is negative.  Sodium 137, potassium 4.9, chloride 98, bicarbonate 30, BUN 39, creatinine 9.72, glucose 270, and calcium of 8.7.  WBC 5.6, hemoglobin 9.3, hematocrit 29.3, platelet count 148,000.  Blood cultures from 12/11/2014 are negative.   BRIEF HOSPITAL COURSE: Kelly [name] is a 34 year old African American female with mental retardation, bipolar schizoaffective disorder, and end-stage renal disease on hemodialysis who is from a group home who presents to the hospital secondary to malfunctioning dialysis catheter and missing hemodialysis for 4 days prior to admission.  1.   Malfunctioning dialysis catheter, clotted Permacath. She was admitted, Permacath was changed over guidewire which has been functioning now and she had back to back dialysis sessions in the hospital.  Now she is back on her schedule.  Last dialysis was 12/13/2014, and next dialysis will be 12/15/2014.  She was seen by vascular who were changing the catheter and also nephrology. She has been getting her home medications as per schedule.  2.  Sepsis.  Had fevers on admission as high as 103 degree Fahrenheit, no source was identified, only factors could be she had this new Permacath placed and changed over guidewire. Blood cultures drawn from the site are negative at this time. Fevers resolved but she was placed on vancomycin with dialysis. The other source of infection would be a bad tooth noted in her mouth.  Seen by Dr.  Ola Spurr who recommended at this time the obvious source of infection seems to be to the tooth and she will need a dentist follow up for sure.  She is changed over to Augmentin and being discharged at this time. However, if fevers recur, she will need repeat blood cultures and the Permacath has to be evaluated at that time.  3.  All her other home medications are being continued without any changes. Her course has been stable while in the hospital.   DISCHARGE CONDITION: Stable.   DISCHARGE DISPOSITION: To group home.   TIME SPENT ON DISCHARGE: 40 minutes.     ____________________________ Gladstone Lighter, MD rk:DT D: 12/14/2014 13:48:10 ET T: 12/14/2014 14:59:25 ET JOB#: [id number]  cc: Gladstone Lighter, MD, <Dictator> [place]  Gladstone Lighter MD ELECTRONICALLY SIGNED 12/16/2014 11:03

## 2015-03-27 NOTE — Consult Note (Signed)
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  Patient seen and chart reviewed.  Patient has no new complaints.  As usual she is not very talkative.  Awake.  Made eye contact.  Stated she was feeling okay.  No sign of any hostility or aggression.  Appears to be cooperative with treatment.  Tolerating medications okay. change to medication.  Patient appears to be at her baseline in terms of mental state.  As I mentioned she will always be prone to needing careful management because of her limited cognitive capacity but she seems to be doing fine right now.  Will follow-up as necessary.  Electronic Signatures: Gonzella Lex (MD)  (Signed on 26-Jan-16 20:59)  Authored  Last Updated: 26-Jan-16 20:59 by Gonzella Lex (MD)

## 2015-03-27 NOTE — Consult Note (Signed)
Psychiatry: Patient seen on Thursday evening.  At that time she was in bed lying still but was awake.  Made eye contact.  She grunted a few acknowledgments to some of my questions but would not talk to me.  Nursing reports that they have seen her behavior to of been stable and typical of her usual presentation during the day.  She had still been getting up and walking around at times. is still typical of this patient.  He can be hard to assess when she is sicker and when she is not because even at baseline she has spells of getting sullen and uncommunicative.  No indication I saw her last night to change her medication.  Will continue to follow up.  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  Electronic Signatures: Gonzella Lex (MD)  (Signed on 29-Jan-16 09:33)  Authored  Last Updated: 29-Jan-16 09:33 by Gonzella Lex (MD)

## 2015-03-27 NOTE — H&P (Signed)
PATIENT NAME:  Kelly [name], Kelly [name] MR#:  [id number] DATE OF BIRTH:  11/17/1981  DATE OF ADMISSION:  12/10/2014  NEPHROLOGIST: [facility]  CHIEF COMPLAINT: Dialysis access malfunctioning.    HISTORY OF PRESENT ILLNESS: The patient is a 34 year old African American female with a history of mental retardation, bipolar/schizoaffective disorder, who came to the Emergency Room from the dialysis center after they could not access her dialysis catheter, which is a right tunneled dialysis catheter, which is not working. The patient has not gotten dialysis since 12/06/2014. She lives at [facility]. The patient denies any complaints at this time, except that she is hungry.   PAST MEDICAL HISTORY:  1.  History of mental retardation.  2.  Bipolar/schizoaffective disorder.  3.  End-stage renal disease on hemodialysis.  4.  Malignant hypertension.  5.  Hyperlipidemia.  6.  Anxiety.  7.  History of enterococcus bacteremia.  8.  Traumatic brain injury.  9.  Type 2 diabetes.  10.  Anemia of chronic disease.   SOCIAL HISTORY: She is a resident at [facility]. She is a nonsmoker. No drug use. Code status is Full Code. She has a guardian.   FAMILY HISTORY:  Significant for diabetes in her mother; this was obtained from old records.   PAST SURGICAL HISTORY: 1.  Cholecystectomy.  2.  AV fistula placement.  3.  Excision of the right arm AV fistula.  4.  The patient has had multiple interventions for her dialysis access.   REVIEW OF SYSTEMS:  EARS, NOSE, AND THROAT: No tinnitus, ear pain, hearing loss.  RESPIRATORY: No cough, wheeze, dyspnea.  CARDIOVASCULAR: No chest pain, orthopnea, edema. Regular.  GASTROINTESTINAL: No nausea, vomiting, diarrhea, abdominal pain.  GENITOURINARY: No dysuria, hematuria, frequency.  ENDOCRINE: No polyuria, nocturia, or thyroid problems.  HEMATOLOGY: No anemia or easy bruising or bleeding.  SKIN: No acne, rash, or lesion. No CVA,  TIA.  PSYCHIATRIC: Known anxiety. Positive for bipolar disorder and schizoaffective disorder.   MEDICATIONS:  The pharmacy tech is trying to obtain list from [facility].   PHYSICAL EXAMINATION:  GENERAL: The patient is awake. She is alert. She is not in acute distress.  VITAL SIGNS: Temperature is 99.2, pulse is 87, blood pressure is 184/112, saturations are 98% on room.  HEENT: Atraumatic, normocephalic. Pupils PERRLA. EOM intact. Oral mucosa is moist.  NECK: Supple. No JVD. No carotid bruit.  LUNGS: Clear to auscultation.  CHEST: There is a right tunneled dialysis catheter present on the right upper chest.  CARDIOVASCULAR: Both heart sounds are normal. Rate and rhythm regular. PMI not lateralized.  CHEST: Nontender. Catheter site looks okay. The skin over the catheter site is normal.  ABDOMEN: Obese, soft, nontender. No organomegaly. Positive bowel sounds.  NEUROLOGIC: Grossly intact cranial nerves II through XII. No motor or sensory deficit.  PSYCHIATRIC: The patient is awake, alert, oriented x 1.  LABORATORY DATA: H and H are 10.5 and 33.6, platelet count is 187,000, white count is 6.8. Glucose 327, BUN is 48, creatinine is 14, sodium is 131, potassium is 4.4, chloride is 90. SGOT is 10, total protein is 7.4, albumin is 2.8. Serum beta-hCG is 1.   ASSESSMENT AND PLAN:  The patient, 34 year old Jamal [facility], with a history of end-stage renal disease, bipolar/ schizoaffective disorder, hypertension and anemia, comes in with:  1.  Malfunctioning hemodialysis catheter. The patient has missed dialysis; her last dialysis was 12/06/2014. Her potassium was within normal limits. I spoke with  Dr. Holley Raring, who will get in touch with vascular to see if they can evaluate the hemodialysis catheter.  2.  End-stage renal disease on hemodialysis. Hemodialysis will be resumed once the catheter is evaluated by vascular.  3.  Anemia of chronic disease. Hemoglobin appears stable.  4.  Malignant  hypertension. I will start her on Nitro-Bid paste. Pharmacy tech is in the process of getting her medication list  from her group home. We will resume her home medications.  5.  Poor peripheral intravenous access. The patient has had similar issues in the past where she has very poor intravenous access. The patient also gets upset if she is stuck many times. We will try to get her blood pressure down with Nitro-Bid ointment at this time and avoid any peripheral IV sticks at present if needed, we could have either vascular put in another special line, or get [facility] help out with a peripheral line. No family members present.  6.  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 We will continue her psych medications once we have the medication list.  7.  Type 2 diabetes. Continue sliding scale insulin and her long-acting insulin.  8.  Patient is a Full Code.   TIME SPENT: 50 minutes.   The case was discussed with Dr. Anthonette Legato.    ____________________________ Hart Rochester. Posey Pronto, MD sap:MT D: 12/10/2014 15:15:48 ET T: 12/10/2014 15:43:40 ET JOB#: [id number]  cc: Delara Shepheard A. Posey Pronto, MD, <Dictator> Ilda Basset MD ELECTRONICALLY SIGNED 12/21/2014 14:21

## 2015-03-27 NOTE — Consult Note (Signed)
PATIENT NAME:  Kelly [name], Kelly [name] MR#:  [id number] DATE OF BIRTH:  [birth year]  DATE OF CONSULTATION:  12/19/2014  CONSULTING PHYSICIAN:  Athziry Millican K. Franchot Mimes, MD  PLACE OF DICTATION: [facility], room [address], [place], [place].   AGE: 34 years.  SEX: Female.  RACE: African American.  SUBJECTIVE: The patient was seen in consultation in room 136. A 34 year old African-American female not working and last employed in 2015 and could not give details of the job. The patient is single, never married, and had a child that died. The patient has been in group homes since 2015 and social services got involved and put her into a home because   of not being in  a safe environment.and mother is not able to care for her. The name of the group home is [facility]. . The patient  is being treated for  diabetes mellitus. According to the information obtained the staff, the guardian has not been in touch for 1 month  and does not answer back though she has been her guardian for quite some time. Discussed with the staff that the patient needs a new guardian at this time as current guardian is not being responsible for her care.   CHIEF COMPLAINT: "I am sick and they brought me here from the group home." According to information obtained from the staff, the patient is very cooperative.    PAST PSYCHIATRIC HISTORY: No history of inpatient on psychiatry and not being followed by a psychiatrist. History of suicide attempt on one occasion.     ALCOHOL AND DRUGS: Denies drinking alcohol. Does admit to using cocaine on a few occasions, which she smoked. She last used it 7 days ago. The patient reports that she dropped out in tenth grade because she was pregnant and the child died.   MENTAL STATUS EXAM: The patient is alert and oriented. She knew it was January 2016. Denies feeling depressed. Denies feeling hopeless, helpless. Denies feeling worthless or useless. Denies having ideas or plans to hurt others. No psychosis. Denies  auditory or visual hallucinations, delusions, or paranoid thinking. and , "she said will jump out and leave the area." She could spell the word world forward and backwards though she was pretty slow in responding and took time and took some changes. Denies any suicidal or homicidal plans. Insight and judgment guarded. Impulse control appears to be fair at this time.   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  PLAN: Discharge the patient back to her group home when she is medically cleared and stable. Recommend that social services should contact for appropriate help and appoint with a new guardian, as the current guardian is not being helpful and not being responsible. Guardianship appointment can be done through legal services, as she does need help as mother has problems with drugs and alcohol.     ____________________________ Wallace Cullens. Franchot Mimes, MD skc:TT D: 12/19/2014 13:32:55 ET T: 12/19/2014 17:26:30 ET JOB#: [id number]  cc: Arlyn Leak K. Franchot Mimes, MD, <Dictator> Dewain Penning MD ELECTRONICALLY SIGNED 12/25/2014 12:02

## 2015-03-27 NOTE — Consult Note (Signed)
PATIENT NAME:  Kelly [name], Kelly [name] MR#:  [id number] DATE OF BIRTH:  1981/08/15  DATE OF CONSULTATION:  12/13/2014  REFERRING PHYSICIAN:   CONSULTING PHYSICIAN:  Cheral Marker. Ola Spurr, MD   REQUESTING PHYSICIAN: Gladstone Lighter, M.D.   REASON FOR CONSULT: Fevers in a dialysis patient.   HISTORY OF PRESENT ILLNESS: This is a pleasant, 34 year old female, with history of mental retardation, bipolar schizoaffective disorder, who was sent to the ER from dialysis as they could not access her right tunneled dialysis catheter. She had not had dialysis in over 4 days. The patient was admitted and seen by Dr. Delana Meyer, who exchanged her dialysis catheter apparently on the same side on January 15. She has then since undergone 2 dialysis sessions. However, the patient has also been noted to have fevers without source. Blood cultures have been done and have been negative. The history is somewhat limited as the patient has advanced mental retardation. She, however, denies any dysuria, diarrhea, cough, myalgias. She does report pain in her right upper molar, which she says is cracked and has to be removed. She has not had any sore throat. She denies any open wounds. She does report some mild left knee pain.   PAST MEDICAL HISTORY:  1. Mental retardation.  2. Bipolar schizoaffective.  3. End-stage renal disease on dialysis.  4. History of hypertension.  5. Hyperlipidemia. .  7. Traumatic brain injury.  8. Type 2 diabetes.  9. Anemia of chronic disease.  10. History of enterococcal bacteremia.   SOCIAL HISTORY: She is a resident of [facility]. She does not smoke. No drug use.   FAMILY HISTORY: Positive for diabetes in her mother.   PAST SURGICAL HISTORY: Cholecystectomy, AV fistula placement, excision of right arm AV fistula, multiple interventions for dialysis access.   REVIEW OF SYSTEMS: Eleven systems reviewed and negative except as per HPI.   ALLERGIES: The patient has no known drug allergies.   ANTIBIOTICS SINCE ADMISSION: Include vancomycin.   PHYSICAL EXAMINATION:  VITAL SIGNS: Temperature currently 97.7, pulse 107, blood pressure 175/134, respirations 18, saturation 98% on room air. Temperature maximum since admission was 103 on January 16. Her last temperature was 101 on January 16. She has been afebrile for 36 hours.  GENERAL: She is pleasant, interactive. She is a poor historian, but is able to converse.  HEENT: Pupils equal, round and reactive to light and accommodation. Extraocular movements are intact. Sclerae are anicteric. Oropharynx is clear with no thrush. She does have in the right upper molar, a cracked tooth, which she says is painful. There is no obvious abscess.  NECK: Supple.  HEART: Regular.  LUNGS: Clear.  ABDOMEN: Soft, nontender, nondistended. No hepatosplenomegaly.  ACCESS: She has a right IJ access site, which is somewhat tender to palpation, but it was recently changed.  EXTREMITIES: No clubbing, cyanosis or edema.  MUSCULOSKELETAL: The left knee has mild pain with motion, but there is no marked effusion or warmth.  SKIN: No lesions or areas of skin breakdown noted.  LABORATORY DATA: White blood count on admission was 6.8, currently 5.6, hemoglobin 9.2, platelets 148,000.   Blood cultures, January 16, and 2 of 2 negative.   LFTs were relatively normal on admission except albumin low at 2.8. Renal function is consistent with end-stage renal disease.   IMAGING: X-ray, January 16, showed no acute cardiopulmonary disease.   IMPRESSION: A 34 year old female with end-stage renal disease on dialysis through a right tunneled catheter, admitted January 15 with a malfunctioning catheter that has  since been changed. She has also developed fevers. Blood cultures have been negative. Chest x-ray was negative. No obvious etiology. She seems to have defervesced over the last 36 hours. Her only complaints are painful right upper molar, which does appear cracked and could  be a source of infection. She also complains of some left knee pain, but I see no evidence of effusion or septic joint. Her white blood count is normal.   RECOMMENDATIONS: 1. Repeat blood cultures are pending.  2. Continue vancomycin.  3. Check HIV.  4. If her fever remains resolved and cultures are all negative, I suspect she has a dental infection. This could be treated with oral antibiotics. We will have to monitor her hemodialysis site carefully to ensure that there is no brewing infection at that site as it appears the fevers occurred shortly after change of her catheter.   Thank you for the consult. I will be glad to follow with you.    ____________________________ Cheral Marker. Ola Spurr, MD dpf:JT D: 12/13/2014 13:44:21 ET T: 12/13/2014 14:38:05 ET JOB#: [id number]  cc: Cheral Marker. Ola Spurr, MD, <Dictator> Jamaar Howes Ola Spurr MD ELECTRONICALLY SIGNED 12/22/2014 21:01

## 2015-03-27 NOTE — Consult Note (Signed)
General Aspect complication of dialysis access   Present Illness The patient is a 34 year old woman with a history of mental retardation, bipolar/schizoaffective disorder, who came to the Emergency Room from the dialysis center after they could not access her dialysis catheter.  Her current access is a right tunneled dialysis catheter, which is not working. The patient has not gotten dialysis since 12/06/2014. She did not show up for dialysis on Wednesday.  She lives at [facility]. No fever or chils  PAST MEDICAL HISTORY:  1.  History of mental retardation.  2.  Bipolar/schizoaffective disorder.  3.  End-stage renal disease on hemodialysis.  4.  Malignant hypertension.  5.  Hyperlipidemia.  6.  Anxiety.  7.  History of enterococcus bacteremia.  8.  Traumatic brain injury.  9.  Type 2 diabetes.  10.  Anemia of chronic disease.   Home Medications: Medication Instructions Status  medroxyPROGESTERone 150 mg/mL intramuscular suspension 1 milliliter(s) intramuscular every 3 months (every 84 days) for contraception Active  SEROquel 200 mg oral tablet 1 tab(s) orally 2 times a day Active  Lantus 100 units/mL subcutaneous solution 20 unit(s) subcutaneous once a day (at bedtime) Active  Lexapro 10 mg oral tablet 1.5 tab(s) orally once a day Active  aspirin 81 mg oral tablet, chewable 1 tab(s) orally once a day Active  Therems Therapeutic Multiple Vitamins oral tablet 1 tab(s) orally once a day Active  folic acid 1 mg oral tablet 2 tab(s) orally once a day (at bedtime) Active  cloNIDine 0.2 mg oral tablet 1 tab(s) orally 2 times a day Active  nystatin-triamcinolone topical 100000 units/g-0.1% topical cream Apply topically to affected area every 12 hours Active  calcium acetate 667 mg oral capsule 2 cap(s) orally 2 times a day (with meals) Active  metoprolol tartrate 50 mg oral tablet 1 tab(s) orally 2 times a day Active  hydrALAZINE 50 mg oral tablet 1 tab(s) orally 3 times a day  Active  Sensipar 30 mg oral tablet 1 tab(s) orally once a day (at bedtime) Active  nystatin topical 100000 units/g topical cream Apply topically to affected area 4 times a day Active  traZODone 100 mg oral tablet 1 tab(s) orally once a day (at bedtime) Active  divalproex sodium 500 mg oral delayed release tablet 2 tab(s) orally once a day (at bedtime) Active  minerin creme Apply topically to affected area once a day (at bedtime), As Needed Active  Vitamin B12 1000 mcg oral tablet 1 tab(s) orally once a day (at bedtime) Active  Colace sodium 100 mg oral capsule 1 cap(s) orally once a day (at bedtime) Active  Norvasc 10 mg oral tablet 1 tab(s) orally once a day (at bedtime) Active  MiraLax - oral powder for reconstitution 17 gram(s) orally once a day, As Needed - for Constipation Active  Deep Sea Nasal Spray 0.65% nasal spray 2 spray(s) nasal every 2 hours, As Needed Active  GlucaGen HypoKit recombinant 1 mg injectable powder for injection 1 milligram(s) injectable once, As Needed Active    No Known Allergies:   Case History:  Family History Non-Contributory   Social History negative tobacco, negative ETOH, negative Illicit drugs   Review of Systems:  ROS No TIA/stroke/seizure No heat or cold intolerance No dysuria/hematuria No blurry or double vision No tinnitus or ear pain No rashes or ulcer No recent psychotic episoded  bipolar depression onder control  No signs of bleeding or easy bruising No SOB/DOE, orthopnea, or sputum No palpitations or chest  pain No N/V/D or abdominal pain No joint pain or joint swelling No fever or chills No unintentional weight loss or gain   Physical Exam:  GEN well developed, well nourished, no acute distress   HEENT hearing intact to voice, moist oral mucosa   NECK supple  trachea midline   RESP normal resp effort  no use of accessory muscles   CARD regular rate  no JVD   VASCULAR ACCESS Dialysis catheter present  -- Purulent drainage    ABD denies tenderness  soft   EXTR negative cyanosis/clubbing, positive edema   SKIN normal to palpation, No rashes, No ulcers   NEURO cranial nerves intact, follows commands, motor/sensory function intact   PSYCH alert, poor insight   Nursing/Ancillary Notes: **Vital Signs.:   15-Jan-16 23:16  Vital Signs Type Post Dialysis; Patient refused   Hepatic:  15-Jan-16 12:12   Bilirubin, Total 0.3  Alkaline Phosphatase 91 (46-116 NOTE: New Reference Range 06/15/14)  SGPT (ALT)  < 6 (14-63 NOTE: New Reference Range 06/15/14)  SGOT (AST)  10  Total Protein, Serum 7.4  Albumin, Serum  2.8  Routine Chem:  15-Jan-16 12:12   Glucose, Serum  327  BUN  48  Creatinine (comp)  14.95  Sodium, Serum  131  Potassium, Serum 4.4  Chloride, Serum  90  CO2, Serum 30  Calcium (Total), Serum 9.3  Anion Gap 11  Osmolality (calc) 288  eGFR (African American)  4  eGFR (Non-African American)  3 (eGFR values <22m/min/1.73 m2 may be an indication of chronic kidney disease (CKD). Calculated eGFR, using the MRDR Study equation, is useful in  patients with stable renal function. The eGFR calculation will not be reliable in acutely ill patients when serum creatinine is changing rapidly. It is not useful in patients on dialysis. The eGFR calculation may not be applicable to patients at the low and high extremes of body sizes, pregnant women, and vegetarians.)    19:08   Phosphorus, Serum  2.4 (Result(s) reported on 10 Dec 2014 at 08:04PM.)  Routine Hem:  15-Jan-16 12:12   WBC (CBC) 6.8  RBC (CBC)  3.40  Hemoglobin (CBC)  10.5  Hematocrit (CBC)  33.6  Platelet Count (CBC) 187  MCV 99  MCH 31.0  MCHC  31.4  RDW  15.4  Neutrophil % 76.2  Lymphocyte % 15.6  Monocyte % 6.0  Eosinophil % 1.5  Basophil % 0.7  Neutrophil # 5.2  Lymphocyte # 1.1  Monocyte # 0.4  Eosinophil # 0.1  Basophil # 0.0 (Result(s) reported on 10 Dec 2014 at 12:24PM.)   XRay:    16-Jan-16 12:01, Chest PA and  Lateral  Chest PA and Lateral   REASON FOR EXAM:    fever  COMMENTS:       PROCEDURE: DXR - DXR CHEST PA (OR AP) AND LATERAL  - Dec 11 2014 12:01PM     CLINICAL DATA:  Current history of end-stage renal disease on  hemodialysis, presenting with acute onset fever.    EXAM:  CHEST  2VIEW    COMPARISON:  06/03/2014 dating back to 03/04/2012.    FINDINGS:  Right jugular dialysis catheter tip projects over the lower SVC at  the expected location of the cavoatrial junction. Cardiomediastinal  silhouette unremarkable. Lungs clear. Bronchovascular markings  normal. Pulmonary vascularity normal. No visible pleural effusions.  No pneumothorax. Stents are noted in the right and left axillary  veins.     IMPRESSION:  No acute cardiopulmonary disease.  Electronically Signed    By: Evangeline Dakin M.D.    On: 12/11/2014 12:13       Verified By: Deniece Portela, M.D.,    Impression 1.  Malfunctioning hemodialysis catheter. The patient has missed dialysis; her last dialysis was 12/06/2014. Because of this her catheter will be replace urgently.  TPA infusion for clearance is not a viable option at this time. 2.  End-stage renal disease on hemodialysis. Hemodialysis will be resumed once the catheter is evaluated by vascular.  3.  Anemia of chronic disease. Hemoglobin appears stable.  4.  Malignant hypertension. I will start her on Nitro-Bid paste. Pharmacy tech is in the process of getting her medication list  from her group home. We will resume her home medications.  5.  Poor peripheral intravenous access. The patient has had similar issues in the past where she has very poor intravenous access. The patient also gets upset if she is stuck many times. We will try to get her blood pressure down with Nitro-Bid ointment at this time and avoid any peripheral IV sticks at present if needed, we could have either vascular put in another special line, or get [facility] help out  with a peripheral line. No family members present.  6.  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 We will continue her psych medications once we have the medication list.  7.  Type 2 diabetes. Continue sliding scale insulin and her long-acting insulin.   Plan level 3 consult   Electronic Signatures: Hortencia Pilar (MD)  (Signed 17-Jan-16 13:37)  Authored: General Aspect/Present Illness, Home Medications, Allergies, History and Physical Exam, Vital Signs, Labs, Radiology, Impression/Plan   Last Updated: 17-Jan-16 13:37 by Hortencia Pilar (MD)
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
## 2015-03-27 NOTE — Discharge Summary (Signed)
PATIENT NAME:  Kelly [name], Kelly [name] MR#:  [id number] DATE OF BIRTH:  November 10, 1981  DATE OF ADMISSION:  12/10/2014 DATE OF DISCHARGE:  12/24/2014  DISCHARGE DIAGNOSIS:    1.  Clotted dialysis access, clotted Permacath status post change of Permacath over the guidewire.  2.  Sepsis secondary to dental infection. Blood cultures negative.  3.  History of mental retardation.  4.  End-stage renal disease on Monday, Wednesday, Friday hemodialysis.  5.  Bipolar and schizoaffective disorder.  6.  Hypertension.  7.  Noncompliance with medications.  8.  Hyperlipidemia.  9.  Anxiety.  10. Traumatic brain injury.  11. Type 2 diabetes mellitus.  12.  Anemia of chronic disease 13.  Hypoglycemia due to poor p.o. intake. Insulin was stopped.   SECONDARY DIAGNOSIS:   1.  History of mental retardation.  2.  Bipolar/schizoaffective disorder.  3.  End-stage renal disease on hemodialysis.  4.  Malignant hypertension.  5.  Hyperlipidemia.  6.  Anxiety.  7.  History of enterococcus bacteremia.  8.  Traumatic brain injury.  9.  Type 2 diabetes.  10.  Anemia of chronic disease.    CONSULTATIONS:    1.  Vascular consultation with Dr. Delana Meyer.  2.  Nephrology consultation by Dr. Inocente Salles and Dr. Candiss Norse.  3.  Infectious Disease consultation by Dr. Ola Spurr.   PROCEDURES AND RADIOLOGY: As dictated in the interim discharge summary dictated by Dr. Tressia Miners on January 19. No new procedures or radiology subsequently obtained.   HISTORY AND SHORT HOSPITAL COURSE: The patient is a 34 year old female with the above-mentioned medical problems, who was admitted for dialysis access malfunctioning. Please see Dr. Gus Height Patel's dictated history and physical for further details. Vascular surgery consultation was obtained with Dr. Hortencia Pilar, who replaced her catheter urgently as TPA infusion for clearance was not a viable option at that time.    Please see Dr. Wyatt Portela dictated interim discharge summary on January  19 for detailed course from admission until since then.  Subsequent to that, the patient was waiting to get placement at group home. She was continuing to get inpatient hemodialysis as scheduled. She was continuing to spike some fever initially thought to be from recurrent Staphylococcus bacteremia, but subsequently per ID input, this was thought to be due to possible dental infection for which antibiotic was switched to oral Augmentin. Since then, the patient has been doing well, but was waiting to get placement, which finally she had on January 29, at [facility] where she is being discharged.  The day before discharge on January 28, she did have a transient episode of hypoglycemia, which was thought to be due to poor p.o. intake and getting insulin, which was stopped. She was started on D10 for short term, which resolved her hypoglycemia. She had been maintaining her blood sugar of D10 drip and was maintaining normally as her last 3-4 blood sugars had been reported to be 133, 69, 138, and 101. The patient was not very happy to go to group home, but she understood the difficulties with her placement and was agreeable to go there.  On the date of discharge, her vital signs are as follows: Temperature 98.5, heart rate 95 per minute, respirations 18 per minute, blood pressure 125/69, and she is saturating 98% on room air.   PERTINENT PHYSICAL EXAMINATION ON THE DATE OF DISCHARGE:  CARDIOVASCULAR: S1, S2 normal. No murmurs, rubs, or gallop.  LUNGS: Clear to auscultation bilaterally. No wheezing, rales, rhonchi, or crepitation.  ABDOMEN: Soft, benign.  NEUROLOGIC: Nonfocal examination.   All other physical examination. All other physical examination remained at baseline.   DISCHARGE MEDICATIONS:      Medication Instructions  medroxyprogesterone 150 mg/ml intramuscular suspension  1 milliliter(s) intramuscular every 3 months (every 84 days) for contraception   seroquel 200 mg oral tablet  1  tab(s) orally 2 times a day   lexapro 10 mg oral tablet  1.5 tab(s) orally once a day   aspirin 81 mg oral tablet, chewable  1 tab(s) orally once a day   therems therapeutic multiple vitamins oral tablet  1 tab(s) orally once a day   folic acid 1 mg oral tablet  2 tab(s) orally once a day (at bedtime)   clonidine 0.2 mg oral tablet  1 tab(s) orally 2 times a day   nystatin-triamcinolone topical 100000 units/g-0.1% topical cream  Apply topically to affected area every 12 hours   calcium acetate 667 mg oral capsule  2 cap(s) orally 2 times a day (with meals)   metoprolol tartrate 50 mg oral tablet  1 tab(s) orally 2 times a day   hydralazine 50 mg oral tablet  1 tab(s) orally 3 times a day   sensipar 30 mg oral tablet  1 tab(s) orally once a day (at bedtime)   trazodone 100 mg oral tablet  1 tab(s) orally once a day (at bedtime)   divalproex sodium 500 mg oral delayed release tablet  2 tab(s) orally once a day (at bedtime)   minerin creme  Apply topically to affected area once a day (at bedtime), As Needed   vitamin b12 1000 mcg oral tablet  1 tab(s) orally once a day (at bedtime)   colace sodium 100 mg oral capsule  1 cap(s) orally once a day (at bedtime)   norvasc 10 mg oral tablet  1 tab(s) orally once a day (at bedtime)   miralax - oral powder for reconstitution  17 gram(s) orally once a day, As Needed - for Constipation   deep sea nasal spray 0.65% nasal spray  2 spray(s) nasal every 2 hours, As Needed   glucagen hypokit recombinant 1 mg injectable powder for injection  1 milligram(s) injectable once, As Needed     DISCHARGE DIET: Dialysis diet.   DISCHARGE ACTIVITY: As tolerated.   DISCHARGE INSTRUCTIONS AND FOLLOWUP: The patient was instructed to follow up with her primary care physician in 1-2 days. She will need followup by dentist in a week for evaluation of her teeth and gum disease. She will get dialysis as per schedule. She was also requested to get blood culture with dialysis if  she starts developing any fever. She was set up to get home health nursing as she remains at very high risk for readmission.   Total time spent: 45 mins ____________________________ Leverne Amrhein S. Manuella Ghazi, MD vss:LT D: 12/26/2014 17:46:42 ET T: 12/26/2014 19:01:23 ET JOB#: [id number]  cc: Keziah Avis S. Manuella Ghazi, MD, <Dictator> Munsoor Lilian Kapur, MD Katha Cabal, MD Cheral Marker. Ola Spurr, MD Gonzella Lex, MD Lucina Mellow [facility] MD ELECTRONICALLY SIGNED 12/28/2014 10:14

## 2015-03-28 ENCOUNTER — Other Ambulatory Visit: Payer: Self-pay | Admitting: Vascular Surgery

## 2015-03-28 DIAGNOSIS — Z992 Dependence on renal dialysis: Principal | ICD-10-CM

## 2015-03-28 DIAGNOSIS — N186 End stage renal disease: Secondary | ICD-10-CM

## 2015-03-29 ENCOUNTER — Ambulatory Visit
Admission: RE | Admit: 2015-03-29 | Discharge: 2015-03-29 | Disposition: A | Discharge status: Home / Self Care | Payer: Medicaid Other | Source: Ambulatory Visit | Attending: Vascular Surgery | Admitting: Vascular Surgery

## 2015-03-29 DIAGNOSIS — N186 End stage renal disease: Secondary | ICD-10-CM | POA: Insufficient documentation

## 2015-03-29 DIAGNOSIS — Z79899 Other long term (current) drug therapy: Secondary | ICD-10-CM | POA: Diagnosis not present

## 2015-03-29 DIAGNOSIS — E785 Hyperlipidemia, unspecified: Secondary | ICD-10-CM | POA: Insufficient documentation

## 2015-03-29 DIAGNOSIS — E119 Type 2 diabetes mellitus without complications: Secondary | ICD-10-CM | POA: Insufficient documentation

## 2015-03-29 DIAGNOSIS — I120 Hypertensive chronic kidney disease with stage 5 chronic kidney disease or end stage renal disease: Secondary | ICD-10-CM | POA: Insufficient documentation

## 2015-03-29 DIAGNOSIS — I871 Compression of vein: Secondary | ICD-10-CM | POA: Diagnosis not present

## 2015-03-29 DIAGNOSIS — Z7982 Long term (current) use of aspirin: Secondary | ICD-10-CM | POA: Insufficient documentation

## 2015-03-29 DIAGNOSIS — T82858A Stenosis of vascular prosthetic devices, implants and grafts, initial encounter: Secondary | ICD-10-CM | POA: Insufficient documentation

## 2015-03-29 DIAGNOSIS — Z992 Dependence on renal dialysis: Secondary | ICD-10-CM | POA: Insufficient documentation

## 2015-03-29 DIAGNOSIS — F17200 Nicotine dependence, unspecified, uncomplicated: Secondary | ICD-10-CM | POA: Insufficient documentation

## 2015-03-29 HISTORY — DX: Bipolar disorder, unspecified: F31.9

## 2015-03-29 HISTORY — DX: Chronic kidney disease, unspecified: N18.9

## 2015-03-29 HISTORY — DX: Essential (primary) hypertension: I10

## 2015-03-29 LAB — HCG, QUANTITATIVE, PREGNANCY: hCG, Beta Chain, Quant, S: 3 m[IU]/mL (ref <5)

## 2015-03-29 LAB — POTASSIUM: Potassium: 3.8 mmol/L (ref 3.5–5.1)

## 2015-03-29 LAB — GLUCOSE, CAPILLARY: GLUCOSE-CAPILLARY: 78 mg/dL (ref 70–99)

## 2015-03-29 MED ORDER — LIDOCAINE HCL (PF) 1 % IJ SOLN
INTRAMUSCULAR | Status: AC
  Filled 2015-03-29: qty 10

## 2015-03-29 MED ORDER — SODIUM CHLORIDE 0.9 % IV SOLN
INTRAVENOUS | Status: DC
  Administered 2015-03-29: 14:00:00 via INTRAVENOUS

## 2015-03-29 MED ORDER — DIPHENHYDRAMINE HCL 50 MG/ML IJ SOLN
INTRAMUSCULAR | Status: AC
  Filled 2015-03-29: qty 1

## 2015-03-29 MED ORDER — MIDAZOLAM HCL 2 MG/2ML IJ SOLN
INTRAMUSCULAR | Status: AC
  Filled 2015-03-29: qty 2

## 2015-03-29 MED ORDER — HEPARIN (PORCINE) IN NACL 2-0.9 UNIT/ML-% IJ SOLN
INTRAMUSCULAR | Status: AC
  Filled 2015-03-29: qty 1000

## 2015-03-29 MED ORDER — FENTANYL CITRATE (PF) 100 MCG/2ML IJ SOLN
INTRAMUSCULAR | Status: AC
  Filled 2015-03-29: qty 2

## 2015-03-29 MED ORDER — IOHEXOL 300 MG/ML  SOLN
INTRAMUSCULAR | Status: DC | PRN
  Administered 2015-03-29: 50 mL via INTRAVENOUS

## 2015-03-29 MED ORDER — CEFAZOLIN SODIUM 1-5 GM-% IV SOLN
INTRAVENOUS | Status: AC
  Filled 2015-03-29: qty 50

## 2015-03-29 MED ORDER — FENTANYL BOLUS VIA INFUSION
INTRAVENOUS | Status: DC | PRN
  Administered 2015-03-29 (×2): 25 ug via INTRAVENOUS
  Administered 2015-03-29: 100 ug via INTRAVENOUS
  Administered 2015-03-29: 50 ug via INTRAVENOUS

## 2015-03-29 MED ORDER — MIDAZOLAM HCL 2 MG/2ML IJ SOLN
INTRAMUSCULAR | Status: DC | PRN
Start: 2015-03-29 — End: 2015-03-29
  Administered 2015-03-29: 2 mg via INTRAVENOUS
  Administered 2015-03-29: 1 mg via INTRAVENOUS
  Administered 2015-03-29 (×2): 0.5 mg via INTRAVENOUS

## 2015-03-29 MED ORDER — HEPARIN SODIUM (PORCINE) 1000 UNIT/ML IJ SOLN
INTRAMUSCULAR | Status: AC
  Filled 2015-03-29: qty 1

## 2015-03-29 MED ORDER — DIPHENHYDRAMINE HCL 50 MG/ML IJ SOLN
INTRAMUSCULAR | Status: DC | PRN
  Administered 2015-03-29: 25 mg

## 2015-03-29 MED ORDER — HEPARIN SODIUM (PORCINE) 10000 UNIT/ML IJ SOLN
INTRAMUSCULAR | Status: AC
  Filled 2015-03-29: qty 1

## 2015-03-29 MED ORDER — CEFAZOLIN SODIUM 1-5 GM-% IV SOLN
INTRAVENOUS | Status: DC | PRN
  Administered 2015-03-29: 1 g via INTRAVENOUS

## 2015-03-29 MED ORDER — MIDAZOLAM HCL 2 MG/ML PO SYRP
10.0000 mg | ORAL_SOLUTION | Freq: Once | ORAL | Status: AC
  Administered 2015-03-29: 10 mg via ORAL

## 2015-03-29 NOTE — Op Note (Signed)
OPERATIVE NOTE   PROCEDURE: 1. Introduction catheter into superior vena cava. 2. Left upper extremity venography. 3. Selective injection left jugular vein  PRE-OPERATIVE DIAGNOSIS:  1. Complication renal dialysis device 2. End-stage renal disease requiring hemodialysis. 3. Superior vena cava syndrome   POST-OPERATIVE DIAGNOSIS: Same  SURGEON: Katha Cabal, M.D. ASSISTANT(S): None  ANESTHESIA: IV sedation  ESTIMATED BLOOD LOSS: Minimal cc  FLUOROSCOPY TIME: 9.6 minutes  CONTRAST USED: Isovue 50 cc  FINDING(S): 1.  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 female who presents with her hemodialysis as a right IJ catheter. She is here today for evaluation for possible upper extremity access. The risks and benefits of been discussed with her guardian and informed consent has been obtained.  DESCRIPTION: After obtaining full informed written consent, the patient was brought back to the operating room and placed supine upon the operating table.  The patient received IV antibiotics prior to induction.  After obtaining adequate anesthesia, the patient was prepped and draped in the standard fashion appropriate time out is called.  The patient is positioned supine with her left arm extended possible port. Left arm is prepped and draped in sterile fashion. Appropriate timeout was called.  Ultrasound is then placed in a sterile sleeve. Ultrasound is utilized to identify the brachial vein which is echolucent and compressible indicating patency. Images recorded for the permanent record. Under real-time visualization after 1% lidocaine has been infiltrated in soft tissues the brachial vein is accessed with a microneedle. Microwire followed by Paralee Cancel is then inserted. Stopcock is placed. Hand injection contrast was then used to demonstrate  the venous anatomy of the left upper extremity. Imaging of the central veins is not adequate and therefore a floppy Glidewire is introduced followed by a 40 cm Kumpe catheter. Multiple views of the central veins are obtained in different projections. Ultimately the glide wire and catheter are negotiated into the superior vena cava and confirmation of placement in the atrium is made. This would allow for a hero graft given the stability to gain wire access and catheter based access to the atrium.  The Kumpe catheter and Glidewire were then pulled back to the level of the clavicular head and subsequently the wire and catheter are negotiated into the internal jugular vein in a retrograde fashion up to the level of the angle of the jaw. Hand injection contrast then utilized to demonstrate the jugular anatomy.  After review these images the catheters removed and pressure was held over the puncture site.  INTERPRETATION: There are diffuse patent veins throughout the left upper extremity. Previously placed venous stent is identified and is occluded. There is occlusion of the axillary vein as well as the distal subclavian vein. There is narrowing with high grade stricture stenosis of the left innominate vein. Catheter access is gained to the superior vena cava and atrium which appear adequate for hero graft construction. The jugular vein is also patent however again it is quite narrowed throughout its entire course. It does appear however that this may be an acceptable pathway into the central venous system for hero graft placement as well. Incidental notation is made of the right jugular tunneled catheter with its tip in good position at the atrial caval junction.  COMPLICATIONS: None  CONDITION: [name], M.D. [facility] Office: [phone]   03/29/2015, 4:02  PM   

## 2015-04-14 ENCOUNTER — Inpatient Hospital Stay: Admission: RE | Admit: 2015-04-14 | Payer: Medicaid Other | Source: Ambulatory Visit

## 2015-04-19 ENCOUNTER — Encounter
Admission: RE | Admit: 2015-04-19 | Discharge: 2015-04-19 | Disposition: A | Discharge status: Home / Self Care | Payer: Medicaid Other | Source: Ambulatory Visit | Attending: Vascular Surgery | Admitting: Vascular Surgery

## 2015-04-19 ENCOUNTER — Ambulatory Visit
Admission: RE | Admit: 2015-04-19 | Discharge: 2015-04-19 | Disposition: A | Discharge status: Home / Self Care | Payer: Medicaid Other | Source: Ambulatory Visit | Attending: Vascular Surgery | Admitting: Vascular Surgery

## 2015-04-19 DIAGNOSIS — Z01810 Encounter for preprocedural cardiovascular examination: Secondary | ICD-10-CM | POA: Insufficient documentation

## 2015-04-19 DIAGNOSIS — Z01812 Encounter for preprocedural laboratory examination: Secondary | ICD-10-CM | POA: Diagnosis not present

## 2015-04-19 DIAGNOSIS — IMO0001 Reserved for inherently not codable concepts without codable children: Secondary | ICD-10-CM

## 2015-04-19 HISTORY — DX: Major depressive disorder, single episode, unspecified: F32.9

## 2015-04-19 HISTORY — DX: Alcohol abuse, uncomplicated: F10.10

## 2015-04-19 HISTORY — DX: Hyperlipidemia, unspecified: E78.5

## 2015-04-19 HISTORY — DX: Other symptoms and signs involving cognitive functions and awareness: R41.89

## 2015-04-19 HISTORY — DX: Depression, unspecified: F32.A

## 2015-04-19 LAB — BASIC METABOLIC PANEL
Anion gap: 14 (ref 5–15)
BUN: 60 mg/dL — AB (ref 6–20)
CO2: 24 mmol/L (ref 22–32)
CREATININE: 9.98 mg/dL — AB (ref 0.44–1.00)
Calcium: 8.8 mg/dL — ABNORMAL LOW (ref 8.9–10.3)
Chloride: 96 mmol/L — ABNORMAL LOW (ref 101–111)
GFR calc non Af Amer: 5 mL/min — ABNORMAL LOW (ref >60)
GFR, EST AFRICAN AMERICAN: 5 mL/min — AB (ref >60)
GLUCOSE: 362 mg/dL — AB (ref 65–99)
Potassium: 3.9 mmol/L (ref 3.5–5.1)
SODIUM: 134 mmol/L — AB (ref 135–145)

## 2015-04-19 LAB — APTT: aPTT: 29 seconds (ref 24–36)

## 2015-04-19 LAB — PROTIME-INR
INR: 1
PROTHROMBIN TIME: 13.4 s (ref 11.4–15.0)

## 2015-04-19 LAB — ABO/RH: ABO/RH(D): A POS

## 2015-04-19 NOTE — OR Nursing (Signed)
Blood sugar 362 patient drank 4 containers of grape juice in PAT

## 2015-04-19 NOTE — Patient Instructions (Signed)
  Your procedure is scheduled on: Fri 04/22/15 Report to Day Surgery. To find out your arrival time please call [phone] between 1PM - 3PM on  Thurs 04/21/15  Remember: Instructions that are not followed completely may result in serious medical risk, up to and including death, or upon the discretion of your surgeon and anesthesiologist your surgery may need to be rescheduled.    _x___ 1. Do not eat food or drink liquids after midnight. No gum chewing or hard candies.     ____ 2. No Alcohol for 24 hours before or after surgery.   ____ 3. Bring all medications with you on the day of surgery if instructed.    __x__ 4. Notify your doctor if there is any change in your medical condition     (cold, fever, infections).     Do not wear jewelry, make-up, hairpins, clips or nail polish.  Do not wear lotions, powders, or perfumes. You may wear deodorant.  Do not shave 48 hours prior to surgery. Men may shave face and neck.  Do not bring valuables to the hospital.    [facility] is not responsible for any belongings or valuables.               Contacts, dentures or bridgework may not be worn into surgery.  Leave your suitcase in the car. After surgery it may be brought to your room.  For patients admitted to the hospital, discharge time is determined by your                treatment team.   Patients discharged the day of surgery will not be allowed to drive home.   Please read over the following fact sheets that you were given:      _x__ Take these medicines the morning of surgery with A SIP OF WATER:    1. amLODipine (NORVASC) 10 MG tablet  2. cloNIDine (CATAPRES) 0.2 MG tablet  3. divalproex (DEPAKOTE) 500 MG DR tablet  4.escitalopram (LEXAPRO) 10 MG tablet  5.hydrALAZINE (APRESOLINE) 50 MG tablet  6.metoprolol (LOPRESSOR) 50 MG tablet    7.QUEtiapine (SEROQUEL) 200 MG tablet ____ Fleet Enema (as directed)   _x___ Use CHG Soap as directed  ____ Use inhalers on the day of  surgery  ____ Stop metformin 2 days prior to surgery    __x__ Take 1/2 of usual insulin dose the night before surgery and none on the morning of surgery.   ____ Stop Coumadin/Plavix/aspirin on  ____ Stop Anti-inflammatories on    ____ Stop supplements until after surgery.    ____ Bring C-Pap to the hospital.    Remember: Instructions that are not followed completely may result in serious medical risk, up to and including death, or upon the discretion of your surgeon and anesthesiologist your surgery may need to be rescheduled.

## 2015-04-20 LAB — TYPE AND SCREEN
ABO/RH(D): A POS
Antibody Screen: NEGATIVE

## 2015-04-22 ENCOUNTER — Ambulatory Visit: Payer: Medicaid Other | Admitting: *Deleted

## 2015-04-22 ENCOUNTER — Ambulatory Visit: Payer: Medicaid Other

## 2015-04-22 ENCOUNTER — Encounter: Payer: Self-pay | Admitting: *Deleted

## 2015-04-22 ENCOUNTER — Observation Stay
Admission: RE | Admit: 2015-04-22 | Discharge: 2015-04-23 | Disposition: A | Discharge status: Home / Self Care | Payer: Medicaid Other | Source: Ambulatory Visit | Attending: Vascular Surgery | Admitting: Vascular Surgery

## 2015-04-22 ENCOUNTER — Encounter
Admission: RE | Disposition: A | Discharge status: Home / Self Care | Payer: Medicaid Other | Source: Ambulatory Visit | Attending: Vascular Surgery

## 2015-04-22 DIAGNOSIS — I871 Compression of vein: Secondary | ICD-10-CM | POA: Insufficient documentation

## 2015-04-22 DIAGNOSIS — G3184 Mild cognitive impairment, so stated: Secondary | ICD-10-CM | POA: Insufficient documentation

## 2015-04-22 DIAGNOSIS — Z992 Dependence on renal dialysis: Secondary | ICD-10-CM | POA: Diagnosis not present

## 2015-04-22 DIAGNOSIS — F39 Unspecified mood [affective] disorder: Secondary | ICD-10-CM | POA: Insufficient documentation

## 2015-04-22 DIAGNOSIS — Z79899 Other long term (current) drug therapy: Secondary | ICD-10-CM | POA: Insufficient documentation

## 2015-04-22 DIAGNOSIS — F17210 Nicotine dependence, cigarettes, uncomplicated: Secondary | ICD-10-CM | POA: Diagnosis not present

## 2015-04-22 DIAGNOSIS — D631 Anemia in chronic kidney disease: Secondary | ICD-10-CM | POA: Diagnosis not present

## 2015-04-22 DIAGNOSIS — Z7982 Long term (current) use of aspirin: Secondary | ICD-10-CM | POA: Insufficient documentation

## 2015-04-22 DIAGNOSIS — Z794 Long term (current) use of insulin: Secondary | ICD-10-CM | POA: Diagnosis not present

## 2015-04-22 DIAGNOSIS — E119 Type 2 diabetes mellitus without complications: Secondary | ICD-10-CM | POA: Insufficient documentation

## 2015-04-22 DIAGNOSIS — M79602 Pain in left arm: Secondary | ICD-10-CM | POA: Diagnosis present

## 2015-04-22 DIAGNOSIS — I10 Essential (primary) hypertension: Secondary | ICD-10-CM | POA: Diagnosis present

## 2015-04-22 DIAGNOSIS — F1010 Alcohol abuse, uncomplicated: Secondary | ICD-10-CM | POA: Diagnosis not present

## 2015-04-22 DIAGNOSIS — I120 Hypertensive chronic kidney disease with stage 5 chronic kidney disease or end stage renal disease: Secondary | ICD-10-CM | POA: Diagnosis present

## 2015-04-22 DIAGNOSIS — F319 Bipolar disorder, unspecified: Secondary | ICD-10-CM | POA: Insufficient documentation

## 2015-04-22 DIAGNOSIS — N186 End stage renal disease: Secondary | ICD-10-CM | POA: Insufficient documentation

## 2015-04-22 HISTORY — PX: VASCULAR ACCESS DEVICE INSERTION: SHX5158

## 2015-04-22 LAB — GLUCOSE, CAPILLARY
Glucose-Capillary: 112 mg/dL — ABNORMAL HIGH (ref 65–99)
Glucose-Capillary: 125 mg/dL — ABNORMAL HIGH (ref 65–99)

## 2015-04-22 LAB — CBC
HEMATOCRIT: 36.3 % (ref 35.0–47.0)
Hemoglobin: 11.8 g/dL — ABNORMAL LOW (ref 12.0–16.0)
MCH: 31.7 pg (ref 26.0–34.0)
MCHC: 32.6 g/dL (ref 32.0–36.0)
MCV: 97.4 fL (ref 80.0–100.0)
Platelets: 207 10*3/uL (ref 150–440)
RBC: 3.72 MIL/uL — AB (ref 3.80–5.20)
RDW: 17 % — AB (ref 11.5–14.5)
WBC: 4.7 10*3/uL (ref 3.6–11.0)

## 2015-04-22 LAB — POTASSIUM: Potassium: 3.9 mmol/L (ref 3.5–5.1)

## 2015-04-22 LAB — HCG, QUANTITATIVE, PREGNANCY: HCG, BETA CHAIN, QUANT, S: 3 m[IU]/mL (ref <5)

## 2015-04-22 SURGERY — INSERTION, CATHETER, HERO
Anesthesia: General | Site: Arm Upper | Laterality: Left | Wound class: Clean

## 2015-04-22 MED ORDER — DOCUSATE SODIUM 100 MG PO CAPS: 100.0000 mg | ORAL_CAPSULE | Freq: Every day | ORAL | Status: DC

## 2015-04-22 MED ORDER — ONDANSETRON HCL 4 MG/2ML IJ SOLN: 4.0000 mg | Freq: Once | INTRAMUSCULAR | Status: DC | PRN

## 2015-04-22 MED ORDER — DOCUSATE SODIUM 100 MG PO CAPS
100.0000 mg | ORAL_CAPSULE | Freq: Two times a day (BID) | ORAL | Status: DC
  Administered 2015-04-22 – 2015-04-23 (×2): 100 mg via ORAL
  Filled 2015-04-22 (×2): qty 1

## 2015-04-22 MED ORDER — SODIUM CHLORIDE 0.9 % IV SOLN
INTRAVENOUS | Status: DC
  Administered 2015-04-22 (×2): via INTRAVENOUS

## 2015-04-22 MED ORDER — HYDROMORPHONE HCL 1 MG/ML IJ SOLN: 0.2500 mg | INTRAMUSCULAR | Status: DC | PRN

## 2015-04-22 MED ORDER — SODIUM CHLORIDE 0.9 % IJ SOLN
INTRAMUSCULAR | Status: AC
  Administered 2015-04-22: 20:00:00
  Filled 2015-04-22: qty 3

## 2015-04-22 MED ORDER — ACETAMINOPHEN 10 MG/ML IV SOLN
INTRAVENOUS | Status: AC
  Filled 2015-04-22: qty 100

## 2015-04-22 MED ORDER — VITAMIN B-12 1000 MCG PO TABS
1000.0000 ug | ORAL_TABLET | Freq: Every day | ORAL | Status: DC
  Administered 2015-04-23: 1000 ug via ORAL
  Filled 2015-04-22: qty 1

## 2015-04-22 MED ORDER — LABETALOL HCL 5 MG/ML IV SOLN
10.0000 mg | INTRAVENOUS | Status: DC | PRN
  Administered 2015-04-23: 10 mg via INTRAVENOUS
  Filled 2015-04-22 (×3): qty 4

## 2015-04-22 MED ORDER — SUCCINYLCHOLINE CHLORIDE 20 MG/ML IJ SOLN
INTRAMUSCULAR | Status: DC | PRN
  Administered 2015-04-22: 100 mg via INTRAVENOUS

## 2015-04-22 MED ORDER — ESCITALOPRAM OXALATE 10 MG PO TABS
15.0000 mg | ORAL_TABLET | Freq: Every day | ORAL | Status: DC
  Administered 2015-04-23: 15 mg via ORAL
  Filled 2015-04-22: qty 2

## 2015-04-22 MED ORDER — TRAZODONE HCL 100 MG PO TABS
100.0000 mg | ORAL_TABLET | Freq: Every day | ORAL | Status: DC
  Administered 2015-04-22: 100 mg via ORAL
  Filled 2015-04-22: qty 1

## 2015-04-22 MED ORDER — FAMOTIDINE 20 MG PO TABS
ORAL_TABLET | ORAL | Status: AC
  Administered 2015-04-22: 20 mg via ORAL
  Filled 2015-04-22: qty 1

## 2015-04-22 MED ORDER — ONDANSETRON HCL 4 MG/2ML IJ SOLN
4.0000 mg | Freq: Four times a day (QID) | INTRAMUSCULAR | Status: DC | PRN
  Administered 2015-04-22: 4 mg via INTRAVENOUS
  Filled 2015-04-22: qty 2

## 2015-04-22 MED ORDER — MEDROXYPROGESTERONE ACETATE 150 MG/ML IM SUSP: 150.0000 mg | INTRAMUSCULAR | Status: DC

## 2015-04-22 MED ORDER — FENTANYL CITRATE (PF) 100 MCG/2ML IJ SOLN
INTRAMUSCULAR | Status: DC | PRN
  Administered 2015-04-22: 150 ug via INTRAVENOUS
  Administered 2015-04-22 (×2): 50 ug via INTRAVENOUS

## 2015-04-22 MED ORDER — QUETIAPINE FUMARATE 100 MG PO TABS
200.0000 mg | ORAL_TABLET | Freq: Two times a day (BID) | ORAL | Status: DC
  Administered 2015-04-22 – 2015-04-23 (×2): 200 mg via ORAL
  Filled 2015-04-22 (×2): qty 2

## 2015-04-22 MED ORDER — ONDANSETRON HCL 4 MG/2ML IJ SOLN
INTRAMUSCULAR | Status: DC | PRN
  Administered 2015-04-22: 4 mg via INTRAVENOUS

## 2015-04-22 MED ORDER — METOPROLOL TARTRATE 50 MG PO TABS
50.0000 mg | ORAL_TABLET | Freq: Two times a day (BID) | ORAL | Status: DC
  Administered 2015-04-22 – 2015-04-23 (×2): 50 mg via ORAL
  Filled 2015-04-22 (×2): qty 1

## 2015-04-22 MED ORDER — ACETAMINOPHEN 10 MG/ML IV SOLN
INTRAVENOUS | Status: DC | PRN
  Administered 2015-04-22: 1000 mg via INTRAVENOUS

## 2015-04-22 MED ORDER — CEFAZOLIN SODIUM 1-5 GM-% IV SOLN
1.0000 g | Freq: Two times a day (BID) | INTRAVENOUS | Status: AC
  Administered 2015-04-22 – 2015-04-23 (×2): 1 g via INTRAVENOUS
  Filled 2015-04-22 (×2): qty 50

## 2015-04-22 MED ORDER — PHENOL 1.4 % MT LIQD
1.0000 | OROMUCOSAL | Status: DC | PRN
  Filled 2015-04-22: qty 177

## 2015-04-22 MED ORDER — HYDRALAZINE HCL 50 MG PO TABS
50.0000 mg | ORAL_TABLET | Freq: Three times a day (TID) | ORAL | Status: DC
  Administered 2015-04-22 – 2015-04-23 (×3): 50 mg via ORAL
  Filled 2015-04-22 (×3): qty 1

## 2015-04-22 MED ORDER — HEPARIN SODIUM (PORCINE) 5000 UNIT/ML IJ SOLN
INTRAMUSCULAR | Status: AC
  Filled 2015-04-22: qty 1

## 2015-04-22 MED ORDER — CALCIUM ACETATE 667 MG PO CAPS
1334.0000 mg | ORAL_CAPSULE | Freq: Two times a day (BID) | ORAL | Status: DC
  Administered 2015-04-23 (×2): 1334 mg via ORAL
  Filled 2015-04-22 (×7): qty 2

## 2015-04-22 MED ORDER — HYDROMORPHONE HCL 1 MG/ML IJ SOLN
0.5000 mg | INTRAMUSCULAR | Status: DC | PRN
  Administered 2015-04-23: 1 mg via INTRAVENOUS
  Filled 2015-04-22: qty 1

## 2015-04-22 MED ORDER — FAMOTIDINE 20 MG PO TABS
20.0000 mg | ORAL_TABLET | Freq: Once | ORAL | Status: AC
  Administered 2015-04-22: 20 mg via ORAL

## 2015-04-22 MED ORDER — ACETAMINOPHEN 325 MG PO TABS: 325.0000 mg | ORAL_TABLET | ORAL | Status: DC | PRN

## 2015-04-22 MED ORDER — HYDRALAZINE HCL 20 MG/ML IJ SOLN: 5.0000 mg | INTRAMUSCULAR | Status: DC | PRN

## 2015-04-22 MED ORDER — INSULIN GLARGINE 100 UNIT/ML ~~LOC~~ SOLN
20.0000 [IU] | Freq: Every day | SUBCUTANEOUS | Status: DC
  Administered 2015-04-22: 20 [IU] via SUBCUTANEOUS
  Filled 2015-04-22 (×2): qty 0.2

## 2015-04-22 MED ORDER — ROCURONIUM BROMIDE 100 MG/10ML IV SOLN
INTRAVENOUS | Status: DC | PRN
  Administered 2015-04-22: 30 mg via INTRAVENOUS
  Administered 2015-04-22: 20 mg via INTRAVENOUS

## 2015-04-22 MED ORDER — NYSTATIN 100000 UNIT/GM EX CREA
1.0000 "application " | TOPICAL_CREAM | Freq: Two times a day (BID) | CUTANEOUS | Status: DC
  Filled 2015-04-22: qty 15

## 2015-04-22 MED ORDER — MIDAZOLAM HCL 2 MG/2ML IJ SOLN
INTRAMUSCULAR | Status: DC | PRN
  Administered 2015-04-22: 3 mg via INTRAVENOUS

## 2015-04-22 MED ORDER — INSULIN ASPART 100 UNIT/ML ~~LOC~~ SOLN: 0.0000 [IU] | Freq: Three times a day (TID) | SUBCUTANEOUS | Status: DC

## 2015-04-22 MED ORDER — ASPIRIN EC 81 MG PO TBEC
81.0000 mg | DELAYED_RELEASE_TABLET | Freq: Every day | ORAL | Status: DC
  Administered 2015-04-23: 81 mg via ORAL
  Filled 2015-04-22: qty 1

## 2015-04-22 MED ORDER — PROPOFOL 10 MG/ML IV BOLUS
INTRAVENOUS | Status: DC | PRN
  Administered 2015-04-22: 200 mg via INTRAVENOUS

## 2015-04-22 MED ORDER — OXYCODONE HCL 5 MG PO TABS
5.0000 mg | ORAL_TABLET | ORAL | Status: DC | PRN
  Administered 2015-04-23: 5 mg via ORAL
  Filled 2015-04-22: qty 1

## 2015-04-22 MED ORDER — FOLIC ACID 1 MG PO TABS
2.0000 mg | ORAL_TABLET | Freq: Every day | ORAL | Status: DC
  Administered 2015-04-23: 2 mg via ORAL
  Filled 2015-04-22: qty 2

## 2015-04-22 MED ORDER — CLONIDINE HCL 0.1 MG PO TABS
0.2000 mg | ORAL_TABLET | Freq: Two times a day (BID) | ORAL | Status: DC
  Administered 2015-04-22 – 2015-04-23 (×2): 0.2 mg via ORAL
  Filled 2015-04-22 (×2): qty 2

## 2015-04-22 MED ORDER — ACETAMINOPHEN 325 MG RE SUPP: 325.0000 mg | RECTAL | Status: DC | PRN

## 2015-04-22 MED ORDER — BUPIVACAINE-EPINEPHRINE (PF) 0.25% -1:200000 IJ SOLN
INTRAMUSCULAR | Status: AC
  Filled 2015-04-22: qty 30

## 2015-04-22 MED ORDER — POLYETHYLENE GLYCOL 3350 17 G PO PACK
17.0000 g | PACK | Freq: Every day | ORAL | Status: DC
  Administered 2015-04-23: 17 g via ORAL
  Filled 2015-04-22: qty 1

## 2015-04-22 MED ORDER — CEFAZOLIN SODIUM 1-5 GM-% IV SOLN
1.0000 g | Freq: Once | INTRAVENOUS | Status: AC
  Administered 2015-04-22: 1 g via INTRAVENOUS

## 2015-04-22 MED ORDER — CEFAZOLIN SODIUM 1-5 GM-% IV SOLN
INTRAVENOUS | Status: AC
  Administered 2015-04-22: 1 g via INTRAVENOUS
  Filled 2015-04-22: qty 50

## 2015-04-22 MED ORDER — CINACALCET HCL 30 MG PO TABS
30.0000 mg | ORAL_TABLET | Freq: Every day | ORAL | Status: DC
  Administered 2015-04-23: 30 mg via ORAL
  Filled 2015-04-22: qty 1

## 2015-04-22 MED ORDER — PANTOPRAZOLE SODIUM 40 MG PO TBEC
40.0000 mg | DELAYED_RELEASE_TABLET | Freq: Every day | ORAL | Status: DC
  Administered 2015-04-23: 40 mg via ORAL
  Filled 2015-04-22: qty 1

## 2015-04-22 MED ORDER — DIVALPROEX SODIUM 250 MG PO DR TAB
500.0000 mg | DELAYED_RELEASE_TABLET | Freq: Three times a day (TID) | ORAL | Status: DC
  Administered 2015-04-22 – 2015-04-23 (×3): 500 mg via ORAL
  Filled 2015-04-22 (×3): qty 2

## 2015-04-22 MED ORDER — AMLODIPINE BESYLATE 10 MG PO TABS
10.0000 mg | ORAL_TABLET | Freq: Every day | ORAL | Status: DC
  Administered 2015-04-23: 10 mg via ORAL
  Filled 2015-04-22: qty 1

## 2015-04-22 MED ORDER — METOPROLOL TARTRATE 1 MG/ML IV SOLN
2.0000 mg | INTRAVENOUS | Status: DC | PRN
Start: 2015-04-22 — End: 2015-04-24

## 2015-04-22 MED ORDER — INSULIN ASPART 100 UNIT/ML ~~LOC~~ SOLN
0.0000 [IU] | Freq: Three times a day (TID) | SUBCUTANEOUS | Status: DC
  Administered 2015-04-23 (×3): 8 [IU] via SUBCUTANEOUS
  Filled 2015-04-22 (×3): qty 8

## 2015-04-22 MED ORDER — DEXAMETHASONE SODIUM PHOSPHATE 4 MG/ML IJ SOLN
INTRAMUSCULAR | Status: DC | PRN
  Administered 2015-04-22: 10 mg via INTRAVENOUS

## 2015-04-22 SURGICAL SUPPLY — 74 items
APPLIER CLIP 9.375 SM OPEN (CLIP)
BAG DECANTER STRL (MISCELLANEOUS) ×3 IMPLANT
BALLN ULTRVRSE 10X60X75 (BALLOONS) ×3
BALLOON ULTRVRSE 10X60X75 (BALLOONS) ×1 IMPLANT
BLADE SURG 15 STRL LF DISP TIS (BLADE) ×1 IMPLANT
BLADE SURG 15 STRL SS (BLADE) ×2
BLADE SURG SZ11 CARB STEEL (BLADE) ×3 IMPLANT
BOOT SUTURE AID YELLOW STND (SUTURE) ×3 IMPLANT
BRUSH SCRUB 4% CHG (MISCELLANEOUS) ×3 IMPLANT
CANISTER SUCT 1200ML W/VALVE (MISCELLANEOUS) ×3 IMPLANT
CANNULA 5F STIFF (CANNULA) ×6 IMPLANT
CHLORAPREP W/TINT 26ML (MISCELLANEOUS) ×6 IMPLANT
CLIP APPLIE 9.375 SM OPEN (CLIP) IMPLANT
COMPONENT HERO ACCESSORY KIT (VASCULAR PRODUCTS) ×3 IMPLANT
COMPONENT HERO ARTERIAL GRAFT (Vascular Products) ×3 IMPLANT
COMPONENT HERO VENOUS OVERFLOW (Vascular Products) ×3 IMPLANT
COVER LIGHT HANDLE STERIS (MISCELLANEOUS) ×3 IMPLANT
COVER PROBE FLX POLY STRL (MISCELLANEOUS) ×6 IMPLANT
DEVICE PRESTO INFLATION (MISCELLANEOUS) ×3 IMPLANT
DEVICE TORQUE (MISCELLANEOUS) ×3 IMPLANT
DRAPE C-ARM XRAY 36X54 (DRAPES) ×3 IMPLANT
DRAPE INCISE IOBAN 66X45 STRL (DRAPES) ×3 IMPLANT
DRAPE SHEET LG 3/4 BI-LAMINATE (DRAPES) ×3 IMPLANT
ELECT CAUTERY BLADE 6.4 (BLADE) ×3 IMPLANT
GEL ULTRASOUND 20GR AQUASONIC (MISCELLANEOUS) ×3 IMPLANT
GLIDEWIRE STIFF .35X180X3 HYDR (WIRE) ×3 IMPLANT
GLOVE SURG SYN 8.0 (GLOVE) ×3 IMPLANT
GOWN STRL REUS W/ TWL LRG LVL3 (GOWN DISPOSABLE) ×1 IMPLANT
GOWN STRL REUS W/ TWL XL LVL3 (GOWN DISPOSABLE) ×1 IMPLANT
GOWN STRL REUS W/TWL LRG LVL3 (GOWN DISPOSABLE) ×2
GOWN STRL REUS W/TWL XL LVL3 (GOWN DISPOSABLE) ×2
GUIDEWIRE SUPER STIFF .035X180 (WIRE) ×6 IMPLANT
HEMOSTAT SURGICEL 2X3 (HEMOSTASIS) ×3 IMPLANT
IV NS 500ML (IV SOLUTION) ×2
IV NS 500ML BAXH (IV SOLUTION) ×1 IMPLANT
KIT RM TURNOVER STRD PROC AR (KITS) ×3 IMPLANT
LABEL OR SOLS (LABEL) ×3 IMPLANT
LIQUID BAND (GAUZE/BANDAGES/DRESSINGS) ×3 IMPLANT
LOOP RED MAXI  1X406MM (MISCELLANEOUS) ×4
LOOP VESSEL MAXI 1X406 RED (MISCELLANEOUS) ×2 IMPLANT
LOOP VESSEL MINI 0.8X406 BLUE (MISCELLANEOUS) IMPLANT
LOOPS BLUE MINI 0.8X406MM (MISCELLANEOUS)
MICROPUNCTURE 5FR NT-U-SST (MISCELLANEOUS) ×3
NDL SAFETY 25GX1.5 (NEEDLE) IMPLANT
NS IRRIG 500ML POUR BTL (IV SOLUTION) ×3 IMPLANT
PACK ANGIOGRAPHY (CUSTOM PROCEDURE TRAY) ×3 IMPLANT
PACK BASIN MAJOR ARMC (MISCELLANEOUS) ×3 IMPLANT
PACK UNIVERSAL (MISCELLANEOUS) ×3 IMPLANT
PAD GROUND ADULT SPLIT (MISCELLANEOUS) ×3 IMPLANT
PAD PREP 24X41 OB/GYN DISP (PERSONAL CARE ITEMS) ×3 IMPLANT
SET MICROPUNCTURE 5FR NT-U-SST (MISCELLANEOUS) ×1 IMPLANT
SHEATH BRITE TIP 8FRX11 (SHEATH) ×3 IMPLANT
STOCKINETTE STRL 6IN 960660 (GAUZE/BANDAGES/DRESSINGS) ×3 IMPLANT
SUT ETHIBOND 3-0  EXTR (SUTURE)
SUT ETHIBOND 3-0 EXTR (SUTURE) IMPLANT
SUT GTX CV-6 30 (SUTURE) ×6 IMPLANT
SUT MNCRL 4-0 (SUTURE) ×2
SUT MNCRL 4-018XMFL (SUTURE) ×1
SUT MNCRL+ 5-0 UNDYED PC-3 (SUTURE) ×1 IMPLANT
SUT MON AB 5-0 P3 18 (SUTURE) ×3 IMPLANT
SUT MONOCRYL 5-0 (SUTURE) ×2
SUT PROLENE 6 0 BV (SUTURE) ×9 IMPLANT
SUT SILK 2 0 (SUTURE) ×2
SUT SILK 2 0 SH (SUTURE) ×3 IMPLANT
SUT SILK 2-0 18XBRD TIE 12 (SUTURE) ×1 IMPLANT
SUT SILK 3 0 (SUTURE) ×2
SUT SILK 3-0 18XBRD TIE 12 (SUTURE) ×1 IMPLANT
SUT SILK 4 0 (SUTURE) ×2
SUT SILK 4-0 18XBRD TIE 12 (SUTURE) ×1 IMPLANT
SUT VIC AB 3-0 SH 27 (SUTURE) ×4
SUT VIC AB 3-0 SH 27X BRD (SUTURE) ×2 IMPLANT
SUTURE MNCRL 4-018XMF (SUTURE) ×1 IMPLANT
SYR 20CC LL (SYRINGE) ×3 IMPLANT
SYRINGE 10CC LL (SYRINGE) IMPLANT

## 2015-04-22 NOTE — Anesthesia Procedure Notes (Signed)
Procedure Name: Intubation Date/Time: 04/22/2015 1:42 PM Performed by: Nelda Marseille Pre-anesthesia Checklist: Patient identified, Emergency Drugs available, Suction available, Patient being monitored and Timeout performed Patient Re-evaluated:Patient Re-evaluated prior to inductionOxygen Delivery Method: Circle system utilized Preoxygenation: Pre-oxygenation with 100% oxygen Intubation Type: IV induction Ventilation: Mask ventilation without difficulty Grade View: Grade III Number of attempts: 1 Airway Equipment and Method: Video-laryngoscopy Placement Confirmation: positive ETCO2 and breath sounds checked- equal and bilateral Secured at: 21 cm Dental Injury: Teeth and Oropharynx as per pre-operative assessment  Difficulty Due To: Difficulty was anticipated, Difficult Airway- due to large tongue and Difficult Airway- due to reduced neck mobility Comments: McGrath used with a #4 blade successfully X 1 attempt

## 2015-04-22 NOTE — Op Note (Signed)
OPERATIVE NOTE   PROCEDURE: 1. Creation of left arm prosthetic A-V graft, HERO graft 2. Placement of venous outflow, intravascular portion of HERO graft 3. Introduction catheter into superior vena cava 4. Percutaneous transluminal angioplasty to left innominate vein. 5. 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 Sara Chu  ANESTHESIA: general  ESTIMATED BLOOD LOSS: 150 cc  FINDING(S): 1.  Multiple small collaterals in the left neck not suitable for passage of the venous outflow portion of the graft therefore subclavian vein on the left is accessed  SPECIMEN(S):  None  INDICATIONS:   Kelly [name] is a 34 y.o. y.o. female who presents with catheter based access. The patient has undergone evaluation in preparation for a upper arm access. The patient appears to be a candidate for a HERO graft. The risks and benefits as well as alternatives have been reviewed in detail with the patient. The patient has had multiple upper extremity access in the past. All questions are answered. Patient's guardian voices understanding and wishes to proceed with left arm hero graft.  DESCRIPTION: After obtaining full informed written consent, the patient was brought back to the operating room and placed supine upon the operating table.  The patient received IV antibiotics prior to induction.  After obtaining adequate anesthesia, the patient was positioned supine with his neck extended slightly and rotated to the right the left neck chest wall and entire left arm is then prepped and draped in the standard fashion appropriate time out is called.    Ultrasound is placed in a sterile sleeve.  Ultrasound is utilized secondary to lack of appropriate landmarks and to avoid vascular injury. Under real-time visualization the collateral vein.  It is noted to be echo lucent and compressible indicating patency.  Image is recorded for the record.  Under real time visualization a microneedle is inserted without difficulty, micro-sheath is then placed over the microwire.  Under fluoroscopic guidance the microwire was not able to be advanced into the venous system. This was repeated into a second vein seen at the base of the left neck with similar results. Under both circumstances contrast injection was performed to view the venous outflow.  Attention is then turned to the subclavian and a microneedle is inserted into the subclavian vein. Under fluoroscopy the microwire was advanced and noted to be within the venous system crossing the midline. Stiffened micropuncture sheath is then advanced over the wire and subsequently a glide wires negotiated so that enough purchase was present that the 8 French sheath can be inserted into the subclavian vein without difficulty.  Hand injection contrast is then utilized to demonstrate the central venous anatomy and a string sign is noted in the left innominate vein extending to the superior vena cava. Using a Kumpe catheter and the glide wire the stenosis was crossed hand injection contrast through the Kumpe verifies the superior vena cava and subsequently the wire and catheter negotiated into the inferior vena cava to apply allow for a stable rail.  A 10 x 6 balloon is then selected and advanced over the Magic torque wire across the lesion and inflated to 19. Follow-up imaging demonstrates a significant improvement . This will allow for passage of the venous outflow component of the HERO graft.  The venous outflow component and  the associated sheath and dilators are then opened on the sterile field and prepped in the usual fashion. 8 French sheath was removed and the  dilators were advanced over the wire and subsequently the dilator and peel-away sheath is inserted under direct fluoroscopic guidance. The venous outflow portion of the HERO graft is then advanced over the wire through the peel-away sheath without difficulty and under fluoroscopic guidance positioned with its tip well into the inferior vena cava. Peel-away sheath is removed as is the dilator wire and the outflow component is flushed copiously with heparinized saline and clamped.  Attention is then turned to the brachial artery. A linear incision is made at about the mid biceps level and the dissection is carried down to expose opposed the muscle the sheath is then opened, muscle reflected laterally and the neurovascular bundle identified. The brachial artery is localized and dissected circumferentially and Silastic vessel loops are placed proximally and distally.  A second incision is then made in the deltopectoral groove and the dissection is carried down through the soft tissues to expose the fascia covering the pectoralis muscle. Using the Bard tunneling device a tunnel was then created from the brachial artery exposure to the deltopectoral incision and subsequently the PTFE portion of the hero graft is secured to the tunneler and then pulled through. The graft is approximated to the brachial artery and then trimmed to an appropriate bevel. Brachial artery was then controlled with the vessel loops, arteriotomy is made and extended with Potts scissors. 6-0 Prolene stay suture is placed and an end graft to side brachial artery anastomosis fashioned with CV 6 Gore suture. Flushing maneuvers were performed and flow is reestablished to the hand. The graft is then flushed with 60 cc of saline and clamped just above the arterial suture line.   Radiology is returned to the OR and under real-time visualization the venous outflow component is pulled back so that the tip is in the mid atrium it is then tunneled from  the neck counterincision to the deltopectoral incision and connected to the PTFE portion of the hero graft without difficulty. 0 Ethibond is used to secure this connection. Flow was now established through the graft and an excellent thrill is noted. Radial pulses maintain.  All 3 wounds were then irrigated with sterile saline the brachial as well as the deltopectoral incisions are closed in layers using 3-0 Vicryl for the subcutaneous layers followed by 4-0 Monocryl subcuticular to close skin. Neck counterincision was closed with 4-0 Monocryl subcuticular. Dermabond is applied to all 3 incisions.  Patient tolerated procedure well there were no immediate complications. Taken to the recovery room in good condition.    COMPLICATIONS: None  CONDITION: Good  Russell Quinney, Dolores Lory. [facility] Office: [phone]  04/22/2015,4:37 PM

## 2015-04-22 NOTE — Progress Notes (Signed)
More alert responsive  Dr Izetta Dakin in to see patient fingerstick 125

## 2015-04-22 NOTE — Anesthesia Preprocedure Evaluation (Signed)
Anesthesia Evaluation  Patient identified by MRN, date of birth, ID band Patient awake  General Assessment Comment:Mentally challenged, living in a group home and a care taker is with her who does not know her.  Reviewed: Allergy & Precautions, NPO status , Patient's Chart, lab work & pertinent test results  Airway Mallampati: II  TM Distance: >3 FB Neck ROM: Full    Dental  (+) Teeth Intact   Pulmonary Current Smoker,    Pulmonary exam normal       Cardiovascular Exercise Tolerance: Poor hypertension, Pt. on medications and Pt. on home beta blockers Normal cardiovascular exam    Neuro/Psych Depression Bipolar Disorder    GI/Hepatic   Endo/Other  diabetes, Poorly Controlled, Type 2, Insulin Dependent  Renal/GU ESRFRenal disease     Musculoskeletal   Abdominal (+) + obese,   Peds  Hematology   Anesthesia Other Findings   Reproductive/Obstetrics                             Anesthesia Physical Anesthesia Plan  ASA: IV  Anesthesia Plan: General   Post-op Pain Management:    Induction: Intravenous  Airway Management Planned: Oral ETT  Additional Equipment:   Intra-op Plan:   Post-operative Plan: Extubation in OR  Informed Consent: I have reviewed the patients History and Physical, chart, labs and discussed the procedure including the risks, benefits and alternatives for the proposed anesthesia with the patient or authorized representative who has indicated his/her understanding and acceptance.     Plan Discussed with: CRNA  Anesthesia Plan Comments:         Anesthesia Quick Evaluation

## 2015-04-22 NOTE — H&P (Signed)
[facility] History & Physical Update  The patient was interviewed and re-examined.  The patient's previous History and Physical has been reviewed and is unchanged.  There is no change in the plan of care.  Nikolaus Pienta, Dolores Lory, MD  04/22/2015, 12:54 PM

## 2015-04-22 NOTE — OR Nursing (Signed)
Irrigation:  5000 units heparin in 500 inj NaCl  Used:  20 ml

## 2015-04-22 NOTE — Transfer of Care (Signed)
Immediate Anesthesia Transfer of Care Note  Patient: Kelly [name]  Procedure(s) Performed: Procedure(s): INSERTION OF HERO VASCULAR ACCESS DEVICE (Left)  Patient Location: PACU  Anesthesia Type:General  Level of Consciousness: sedated  Airway & Oxygen Therapy: Patient connected to face mask oxygen  Post-op Assessment: Report given to RN and Post -op Vital signs reviewed and stable  Post vital signs: stable  Last Vitals:  Filed Vitals:   04/22/15 1052  BP: 190/108  Pulse: 76  Temp: 36.3 C  Resp: 16    Complications: No apparent anesthesia complications

## 2015-04-22 NOTE — Anesthesia Postprocedure Evaluation (Signed)
  Anesthesia Post-op Note  Patient: Kelly [name]  Procedure(s) Performed: Procedure(s): INSERTION OF HERO VASCULAR ACCESS DEVICE (Left)  Anesthesia type:General  Patient location: PACU  Post pain: Pain level controlled  Post assessment: Post-op Vital signs reviewed, Patient's Cardiovascular Status Stable, Respiratory Function Stable, Patent Airway and No signs of Nausea or vomiting  Post vital signs: Reviewed and stable  Last Vitals:  Filed Vitals:   04/22/15 1705  BP: 159/84  Pulse: 98  Temp:   Resp: 24    Level of consciousness: awake, alert  and patient cooperative  Complications: No apparent anesthesia complications

## 2015-04-22 NOTE — Progress Notes (Signed)
Bruit heard to upper chest area left upper arm and near antecubital area   No real thrill felt

## 2015-04-22 NOTE — Progress Notes (Signed)
Hemodialysis catheter (red port) packed with 1.9 ml of Heparin (1,000U/ml) by Dr. [place] Service

## 2015-04-23 DIAGNOSIS — Z992 Dependence on renal dialysis: Secondary | ICD-10-CM

## 2015-04-23 DIAGNOSIS — I10 Essential (primary) hypertension: Secondary | ICD-10-CM | POA: Diagnosis present

## 2015-04-23 DIAGNOSIS — N186 End stage renal disease: Secondary | ICD-10-CM

## 2015-04-23 DIAGNOSIS — E119 Type 2 diabetes mellitus without complications: Secondary | ICD-10-CM

## 2015-04-23 DIAGNOSIS — I120 Hypertensive chronic kidney disease with stage 5 chronic kidney disease or end stage renal disease: Secondary | ICD-10-CM | POA: Diagnosis not present

## 2015-04-23 LAB — RENAL FUNCTION PANEL
Albumin: 3.2 g/dL — ABNORMAL LOW (ref 3.5–5.0)
Anion gap: 18 — ABNORMAL HIGH (ref 5–15)
BUN: 78 mg/dL — ABNORMAL HIGH (ref 6–20)
CHLORIDE: 93 mmol/L — AB (ref 101–111)
CO2: 23 mmol/L (ref 22–32)
CREATININE: 13.26 mg/dL — AB (ref 0.44–1.00)
Calcium: 8.3 mg/dL — ABNORMAL LOW (ref 8.9–10.3)
GFR, EST AFRICAN AMERICAN: 4 mL/min — AB (ref >60)
GFR, EST NON AFRICAN AMERICAN: 3 mL/min — AB (ref >60)
GLUCOSE: 185 mg/dL — AB (ref 65–99)
Phosphorus: 4.8 mg/dL — ABNORMAL HIGH (ref 2.5–4.6)
Potassium: 3.6 mmol/L (ref 3.5–5.1)
SODIUM: 134 mmol/L — AB (ref 135–145)

## 2015-04-23 LAB — CBC
HEMATOCRIT: 31.6 % — AB (ref 35.0–47.0)
Hemoglobin: 10.2 g/dL — ABNORMAL LOW (ref 12.0–16.0)
MCH: 31.8 pg (ref 26.0–34.0)
MCHC: 32.1 g/dL (ref 32.0–36.0)
MCV: 99 fL (ref 80.0–100.0)
PLATELETS: 194 10*3/uL (ref 150–440)
RBC: 3.19 MIL/uL — AB (ref 3.80–5.20)
RDW: 17.4 % — ABNORMAL HIGH (ref 11.5–14.5)
WBC: 6.8 10*3/uL (ref 3.6–11.0)

## 2015-04-23 LAB — GLUCOSE, CAPILLARY
GLUCOSE-CAPILLARY: 203 mg/dL — AB (ref 65–99)
Glucose-Capillary: 220 mg/dL — ABNORMAL HIGH (ref 65–99)
Glucose-Capillary: 219 mg/dL — ABNORMAL HIGH (ref 65–99)

## 2015-04-23 MED ORDER — LIDOCAINE-PRILOCAINE 2.5-2.5 % EX CREA: 1.0000 "application " | TOPICAL_CREAM | CUTANEOUS | Status: DC | PRN

## 2015-04-23 MED ORDER — NEPRO/CARBSTEADY PO LIQD: 237.0000 mL | ORAL | Status: DC | PRN

## 2015-04-23 MED ORDER — LIDOCAINE HCL (PF) 1 % IJ SOLN: 5.0000 mL | INTRAMUSCULAR | Status: DC | PRN

## 2015-04-23 MED ORDER — HEPARIN SODIUM (PORCINE) 1000 UNIT/ML DIALYSIS: 1000.0000 [IU] | INTRAMUSCULAR | Status: DC | PRN

## 2015-04-23 MED ORDER — OXYCODONE-ACETAMINOPHEN 5-325 MG PO TABS: 1.0000 | ORAL_TABLET | ORAL | Status: DC | PRN

## 2015-04-23 MED ORDER — ALTEPLASE 2 MG IJ SOLR: 2.0000 mg | Freq: Once | INTRAMUSCULAR | Status: DC | PRN

## 2015-04-23 MED ORDER — PENTAFLUOROPROP-TETRAFLUOROETH EX AERO: 1.0000 "application " | INHALATION_SPRAY | CUTANEOUS | Status: DC | PRN

## 2015-04-23 MED ORDER — SODIUM CHLORIDE 0.9 % IV SOLN: 100.0000 mL | INTRAVENOUS | Status: DC | PRN

## 2015-04-23 NOTE — Progress Notes (Signed)
Patient tolerated tx without complications. 2041ml fluid removal. Complaining of pain to left arm. Medication administered.  Patient  arrived to HD room with catheter accessed for medication administration on floor.

## 2015-04-23 NOTE — Consult Note (Signed)
[facility] [place] NAME: Kelly [name]    MR#:  [id number]  DATE OF BIRTH:  [birth year]  DATE OF ADMISSION:  04/22/2015  PRIMARY CARE PHYSICIAN: Vista Mink, FNP   REQUESTING/REFERRING PHYSICIAN: Dr. Delana Meyer  Reason for consult - DM, HTN, ESRD  CHIEF COMPLAINT:  No chief complaint on file.  HISTORY OF PRESENT ILLNESS:  Kelly [name]  is a 34 y.o. female with a known history of  34 year old female patient with history of bipolar disorder, end-stage renal disease, hypertension, diabetes admitted to the hospital after having the procedure for left arm AV graft. Consult for diabetes, hypertension, end-stage renal disease. Patient mentions that she has had no recent change in medications and has been compliant with taking her medications. Compliance with medications has been an issue in the past. Patient's blood pressure was significantly elevated earlier as she missed her medications for surgery. But after giving her a dose of IV labetalol blood pressure is much improved. Blood sugars seem to be well controlled with her home medications. Nephrology has been consult that for her end-stage renal disease. Patient with likely be discharged home later today after vascular surgery sees her.   PAST MEDICAL HISTORY:   Past Medical History  Diagnosis Date  . Chronic kidney disease   . Hypertension   . Bipolar disorder   . Depression   . Depression   . Elevated lipids   . Cognitive changes   . Alcohol abuse     chronic    PAST SURGICAL HISTORY:   Past Surgical History  Procedure Laterality Date  . Av fistula placement    . Cholecystectomy    . Dialysis perma cath Right     chest    SOCIAL HISTORY:   History  Substance Use Topics  . Smoking status: Current Every Day Smoker -- 1.00 packs/day for 0 years    Types: Cigarettes  . Smokeless tobacco: Current User  . Alcohol Use: No    FAMILY HISTORY:  History  reviewed. No pertinent family history.  DRUG ALLERGIES:  No Known Allergies  REVIEW OF SYSTEMS:   Review of Systems  Constitutional: Positive for malaise/fatigue. Negative for fever, chills and weight loss.  HENT: Negative for hearing loss and nosebleeds.   Eyes: Negative for blurred vision, double vision and pain.  Respiratory: Negative for cough, hemoptysis, sputum production, shortness of breath and wheezing.   Cardiovascular: Negative for chest pain, palpitations, orthopnea and leg swelling.  Gastrointestinal: Negative for nausea, vomiting, abdominal pain, diarrhea and constipation.  Genitourinary: Negative for dysuria and hematuria.  Musculoskeletal: Negative for myalgias, back pain and falls.  Skin: Negative for rash.  Neurological: Negative for dizziness, tremors, sensory change, speech change, focal weakness, seizures and headaches.  Endo/Heme/Allergies: Does not bruise/bleed easily.  Psychiatric/Behavioral: Negative for depression and memory loss. The patient is not nervous/anxious.     MEDICATIONS AT HOME:   Prior to Admission medications   Medication Sig Start Date End Date Taking? Authorizing Provider  amLODipine (NORVASC) 10 MG tablet Take 10 mg by mouth daily.   Yes Historical Provider, MD  aspirin EC 81 MG tablet Take 81 mg by mouth daily.   Yes Historical Provider, MD  calcium acetate (PHOSLO) 667 MG capsule Take 1,334 mg by mouth 2 (two) times daily.    Yes Historical Provider, MD  cinacalcet (SENSIPAR) 30 MG tablet Take 30 mg by mouth daily.   Yes Historical Provider, MD  cloNIDine (CATAPRES) 0.2 MG tablet  Take 0.2 mg by mouth 2 (two) times daily.   Yes Historical Provider, MD  divalproex (DEPAKOTE) 500 MG DR tablet Take 500 mg by mouth 3 (three) times daily.   Yes Historical Provider, MD  docusate sodium (COLACE) 100 MG capsule Take 100 mg by mouth 2 (two) times daily.   Yes Historical Provider, MD  escitalopram (LEXAPRO) 10 MG tablet Take 15 mg by mouth daily.    Yes Historical Provider, MD  folic acid (FOLVITE) 1 MG tablet Take 2 mg by mouth daily.   Yes Historical Provider, MD  hydrALAZINE (APRESOLINE) 50 MG tablet Take 50 mg by mouth 3 (three) times daily.   Yes Historical Provider, MD  insulin aspart (NOVOLOG) 100 UNIT/ML injection Inject 0-12 Units into the skin 3 (three) times daily before meals. Per sliding scale   Yes Historical Provider, MD  insulin glargine (LANTUS) 100 UNIT/ML injection Inject 20 Units into the skin at bedtime.    Yes Historical Provider, MD  medroxyPROGESTERone (DEPO-PROVERA) 150 MG/ML injection Inject 150 mg into the muscle every 3 (three) months.   Yes Historical Provider, MD  metoprolol (LOPRESSOR) 50 MG tablet Take 50 mg by mouth 2 (two) times daily.   Yes Historical Provider, MD  Multiple Vitamin (THEREMS PO) Take 1 tablet by mouth daily.   Yes Historical Provider, MD  nystatin cream (MYCOSTATIN) Apply 1 application topically 4 (four) times daily.   Yes Historical Provider, MD  polyethylene glycol (MIRALAX / GLYCOLAX) packet Take 17 g by mouth daily.   Yes Historical Provider, MD  QUEtiapine (SEROQUEL) 200 MG tablet Take 200 mg by mouth 2 (two) times daily.    Yes Historical Provider, MD  traZODone (DESYREL) 100 MG tablet Take 100 mg by mouth at bedtime.   Yes Historical Provider, MD  vitamin B-12 (CYANOCOBALAMIN) 1000 MCG tablet Take 1,000 mcg by mouth daily.   Yes Historical Provider, MD  Glucagon rDNA, Diagnostic, (GLUCAGON DIAGNOSTIC IJ) Inject 1 Dose as directed daily as needed.    Historical Provider, MD  oxyCODONE-acetaminophen (ROXICET) 5-325 MG per tablet Take 1 tablet by mouth every 4 (four) hours as needed for severe pain. 04/23/15   Serafina Mitchell, MD      VITAL SIGNS:  Blood pressure 145/67, pulse 92, temperature 98.2 F (36.8 C), temperature source Oral, resp. rate 17, height 5\' 1"  (1.549 m), weight 81.647 kg (180 lb), SpO2 98 %.  PHYSICAL EXAMINATION:  Physical Exam  GENERAL:  34 y.o.-year-old patient  lying in the bed with no acute distress.  EYES: Pupils equal, round, reactive to light and accommodation. No scleral icterus. Extraocular muscles intact.  HEENT: Head atraumatic, normocephalic. Oropharynx and nasopharynx clear. No oropharyngeal erythema, moist oral mucosa  NECK:  Supple, no jugular venous distention. No thyroid enlargement, no tenderness.  LUNGS: Normal breath sounds bilaterally, no wheezing, rales, rhonchi. No use of accessory muscles of respiration.  CARDIOVASCULAR: S1, S2 normal. No murmurs, rubs, or gallops.  ABDOMEN: Soft, nontender, nondistended. Bowel sounds present. No organomegaly or mass.  EXTREMITIES: No pedal edema, cyanosis, or clubbing. + 2 pedal & radial pulses b/l.  Scars from prior vascular procedures on left arm NEUROLOGIC: Cranial nerves II through XII are intact. No focal Motor or sensory deficits appreciated b/l PSYCHIATRIC: The patient is alert and oriented x 3. Pleasant. SKIN: No obvious rash, lesion, or ulcer.   LABORATORY PANEL:   CBC  Recent Labs Lab 04/22/15 1135  WBC 4.7  HGB 11.8*  HCT 36.3  PLT 207   ------------------------------------------------------------------------------------------------------------------  Chemistries   Recent Labs Lab 04/19/15 1509 04/22/15 1136  NA 134*  --   K 3.9 3.9  CL 96*  --   CO2 24  --   GLUCOSE 362*  --   BUN 60*  --   CREATININE 9.98*  --   CALCIUM 8.8*  --    ------------------------------------------------------------------------------------------------------------------  Cardiac Enzymes No results for input(s): TROPONINI in the last 168 hours. ------------------------------------------------------------------------------------------------------------------  RADIOLOGY:  Dg C-arm 61-120 Min-no Report  04/22/2015   CLINICAL DATA: catheter access   C-ARM 61-120 MINUTES  Fluoroscopy was utilized by the requesting physician.  No radiographic  interpretation.      IMPRESSION AND PLAN:    * HTN * DM * ESRD on HD * Bipolar disorder  Continue home meds for DM and HTN WIll need pain meds at d/c. Advance diet. Nephrology following for ESRD.  S/p  AV graft HeRo graft.  Thank you for the consult. Will follow with you while pt is in the hospital.   All the records are reviewed and case discussed with ED provider. Management plans discussed with the patient, family and they are in agreement.  CODE STATUS: FULL CODE  TOTAL TIME TAKING CARE OF THIS PATIENT: 40 minutes.    Hillary Bow R M.D on 04/23/2015 at 11:14 AM  Between 7am to 6pm - Pager - [phone]  After 6pm go to [email or web address] - password [email or web address] [facility]  Office  [phone]  CC: Primary care physician; Vista Mink, FNP

## 2015-04-23 NOTE — Progress Notes (Signed)
[facility]  ROUNDING NOTE   Subjective:  Patient had LUE HERO AVG placed. Still has R IJ permcath in place also. Didn't have HD yesterday, therefore will need it today.   Objective:  Vital signs in last 24 hours:  Temp:  [97 F (36.1 C)-98.6 F (37 C)] 98.2 F (36.8 C) (05/28 1105) Pulse Rate:  [64-106] 92 (05/28 1105) Resp:  [11-37] 17 (05/28 0705) BP: (138-187)/(66-109) 145/67 mmHg (05/28 1105) SpO2:  [97 %-100 %] 98 % (05/28 1105)  Weight change:  Filed Weights   04/22/15 1052  Weight: 81.647 kg (180 lb)    Intake/Output: I/O last 3 completed shifts: In: 772.2 [P.O.:210; I.V.:562.2] Out: 150 [Emesis/NG output:150]   Intake/Output this shift:  Total I/O In: 240 [P.O.:240] Out: 0   Physical Exam: General: NAD,  Head: Normocephalic, atraumatic. Moist oral mucosal membranes  Eyes: Anicteric, PERRL  Neck: Supple, trachea midline  Lungs:  Clear to auscultation  Heart: Regular rate and rhythm  Abdomen:  Soft, nontender,   Extremities:  1+ peripheral edema.  Neurologic: Nonfocal, moving all four extremities  Skin: No lesions  Access: LUE HERO AVG R IJ permcath.    Basic Metabolic Panel:  Recent Labs Lab 04/19/15 1509 04/22/15 1136  NA 134*  --   K 3.9 3.9  CL 96*  --   CO2 24  --   GLUCOSE 362*  --   BUN 60*  --   CREATININE 9.98*  --   CALCIUM 8.8*  --     Liver Function Tests: No results for input(s): AST, ALT, ALKPHOS, BILITOT, PROT, ALBUMIN in the last 168 hours. No results for input(s): LIPASE, AMYLASE in the last 168 hours. No results for input(s): AMMONIA in the last 168 hours.  CBC:  Recent Labs Lab 04/22/15 1135  WBC 4.7  HGB 11.8*  HCT 36.3  MCV 97.4  PLT 207    Cardiac Enzymes: No results for input(s): CKTOTAL, CKMB, CKMBINDEX, TROPONINI in the last 168 hours.  BNP: Invalid input(s): POCBNP  CBG:  Recent Labs Lab 04/22/15 1053 04/22/15 1656 04/23/15 0812  GLUCAP 112* 125* 220*     Microbiology: Results for orders placed or performed in visit on 12/10/14  Culture, blood (single)     Status: None   Collection Time: 12/11/14  6:40 PM  Result Value Ref Range Status   Micro Text Report   Final       COMMENT                   NO GROWTH AEROBICALLY/ANAEROBICALLY IN 5 DAYS   ANTIBIOTIC                                                      Culture, blood (single)     Status: None   Collection Time: 12/11/14  6:40 PM  Result Value Ref Range Status   Micro Text Report   Final       COMMENT                   NO GROWTH AEROBICALLY/ANAEROBICALLY IN 5 DAYS   ANTIBIOTIC  Culture, blood (single)     Status: None   Collection Time: 12/24/14  9:54 AM  Result Value Ref Range Status   Micro Text Report   Final       COMMENT                   NO GROWTH AEROBICALLY/ANAEROBICALLY IN 5 DAYS   ANTIBIOTIC                                                        Coagulation Studies: No results for input(s): LABPROT, INR in the last 72 hours.  Urinalysis: No results for input(s): COLORURINE, LABSPEC, PHURINE, GLUCOSEU, HGBUR, BILIRUBINUR, KETONESUR, PROTEINUR, UROBILINOGEN, NITRITE, LEUKOCYTESUR in the last 72 hours.  Invalid input(s): APPERANCEUR    Imaging: Dg C-arm 61-120 Min-no Report  04/22/2015   CLINICAL DATA: catheter access   C-ARM 61-120 MINUTES  Fluoroscopy was utilized by the requesting physician.  No radiographic  interpretation.      Medications:     . amLODipine  10 mg Oral Daily  . aspirin EC  81 mg Oral Daily  . calcium acetate  1,334 mg Oral BID WC  . cinacalcet  30 mg Oral Q breakfast  . cloNIDine  0.2 mg Oral BID  . divalproex  500 mg Oral TID  . docusate sodium  100 mg Oral BID  . escitalopram  15 mg Oral Daily  . folic acid  2 mg Oral Daily  . hydrALAZINE  50 mg Oral TID  . insulin aspart  0-24 Units Subcutaneous TID WC  . insulin glargine  20 Units Subcutaneous QHS  . metoprolol   50 mg Oral BID  . nystatin cream  1 application Topical BID  . pantoprazole  40 mg Oral Daily  . polyethylene glycol  17 g Oral Daily  . QUEtiapine  200 mg Oral BID  . traZODone  100 mg Oral QHS  . vitamin B-12  1,000 mcg Oral Daily   acetaminophen **OR** acetaminophen, hydrALAZINE, HYDROmorphone (DILAUDID) injection, labetalol, metoprolol, ondansetron, oxyCODONE, phenol  Assessment/ Plan:  34 y.o. female with pmhx of ESRD, AOCD, SHPTH, HTN, hx of AVF, complex mood disorder, mild cognitive impairment, diabetes mellitus, hx of recurrent staph bacteremia, LUE AVG placed 04/22/15.  1.  ESRD on HD MWF: didn't have HD yesterday, will prepare orders for today. 2.  Anemia of CKD: may resume epogen as outpt. 3.  SHPTH: will continue to monitor phos/Ca/PTH as outpt.     LOS:  Kelly [name] 5/28/201611:55 AM

## 2015-04-23 NOTE — Progress Notes (Signed)
    Subjective  - POD #1, s/p HeRO  C/o pain at surgical site   Physical Exam:  Faint thrill in [place] No steal Sx's       Assessment/Plan:  POD #1  OK for d/c C/O pain, will d/c with pain meds F/u as outpatient  Kelly [name], [place] 04/23/2015 9:22 AM --  Filed Vitals:   04/23/15 0917  BP: 167/101  Pulse:   Temp:   Resp:     Intake/Output Summary (Last 24 hours) at 04/23/15 0922 Last data filed at 04/23/15 0500  Gross per 24 hour  Intake 772.17 ml  Output    150 ml  Net 622.17 ml     Laboratory CBC    Component Value Date/Time   WBC 4.7 04/22/2015 1135   WBC 6.0 12/22/2014 1300   HGB 11.8* 04/22/2015 1135   HGB 8.1* 12/22/2014 1300   HCT 36.3 04/22/2015 1135   HCT 25.8* 12/22/2014 1300   PLT 207 04/22/2015 1135   PLT 117* 12/22/2014 1300    BMET    Component Value Date/Time   NA 134* 04/19/2015 1509   NA 135* 12/24/2014 0954   K 3.9 04/22/2015 1136   K 6.3* 12/24/2014 0954   CL 96* 04/19/2015 1509   CL 100 12/24/2014 0954   CO2 24 04/19/2015 1509   CO2 28 12/24/2014 0954   GLUCOSE 362* 04/19/2015 1509   GLUCOSE 138* 12/24/2014 0954   BUN 60* 04/19/2015 1509   BUN 75* 12/24/2014 0954   CREATININE 9.98* 04/19/2015 1509   CREATININE 8.81* 12/24/2014 0954   CALCIUM 8.8* 04/19/2015 1509   CALCIUM 8.6 12/24/2014 0954   GFRNONAA 5* 04/19/2015 1509   GFRNONAA 3* 08/02/2014 1150   GFRAA 5* 04/19/2015 1509   GFRAA 4* 08/02/2014 1150    COAG Lab Results  Component Value Date   INR 1.00 04/19/2015   No results found for: PTT  Antibiotics Anti-infectives    Start     Dose/Rate Route Frequency Ordered Stop   04/22/15 2200  ceFAZolin (ANCEF) IVPB 1 g/50 mL premix    Comments:  Send with pt to OR   1 g 100 mL/hr over 30 Minutes Intravenous Every 12 hours 04/22/15 1636 04/23/15 2159   04/22/15 1045  ceFAZolin (ANCEF) IVPB 1 g/50 mL premix     1 g 100 mL/hr over 30 Minutes Intravenous  Once 04/22/15 1035 04/22/15 1342       V. Leia Alf, M.D. [facility] Office: [phone] Pager:  [phone]

## 2015-04-23 NOTE — Discharge Instructions (Signed)
Notify MD for any signs of infection. Take all medications as prescribed. Call Dr. Nino Parsley office on Tuesday morning for instructions regarding a follow-up appointment.

## 2015-04-25 ENCOUNTER — Encounter: Payer: Self-pay | Admitting: Vascular Surgery

## 2015-04-26 ENCOUNTER — Encounter: Payer: Self-pay | Admitting: Vascular Surgery

## 2015-04-27 ENCOUNTER — Encounter: Payer: Self-pay | Admitting: Vascular Surgery

## 2015-05-24 ENCOUNTER — Ambulatory Visit
Admission: RE | Admit: 2015-05-24 | Discharge: 2015-05-24 | Disposition: A | Discharge status: Home / Self Care | Payer: Medicaid Other | Source: Ambulatory Visit | Attending: Vascular Surgery | Admitting: Vascular Surgery

## 2015-05-24 ENCOUNTER — Encounter
Admission: RE | Disposition: A | Discharge status: Home / Self Care | Payer: Self-pay | Source: Ambulatory Visit | Attending: Vascular Surgery

## 2015-05-24 ENCOUNTER — Encounter: Payer: Self-pay | Admitting: *Deleted

## 2015-05-24 DIAGNOSIS — T829XXA Unspecified complication of cardiac and vascular prosthetic device, implant and graft, initial encounter: Secondary | ICD-10-CM | POA: Diagnosis present

## 2015-05-24 DIAGNOSIS — E213 Hyperparathyroidism, unspecified: Secondary | ICD-10-CM | POA: Insufficient documentation

## 2015-05-24 DIAGNOSIS — I120 Hypertensive chronic kidney disease with stage 5 chronic kidney disease or end stage renal disease: Secondary | ICD-10-CM | POA: Insufficient documentation

## 2015-05-24 DIAGNOSIS — N186 End stage renal disease: Secondary | ICD-10-CM | POA: Insufficient documentation

## 2015-05-24 DIAGNOSIS — F17200 Nicotine dependence, unspecified, uncomplicated: Secondary | ICD-10-CM | POA: Insufficient documentation

## 2015-05-24 DIAGNOSIS — Y841 Kidney dialysis as the cause of abnormal reaction of the patient, or of later complication, without mention of misadventure at the time of the procedure: Secondary | ICD-10-CM | POA: Insufficient documentation

## 2015-05-24 DIAGNOSIS — E1122 Type 2 diabetes mellitus with diabetic chronic kidney disease: Secondary | ICD-10-CM | POA: Insufficient documentation

## 2015-05-24 DIAGNOSIS — Z79899 Other long term (current) drug therapy: Secondary | ICD-10-CM | POA: Insufficient documentation

## 2015-05-24 DIAGNOSIS — Z7982 Long term (current) use of aspirin: Secondary | ICD-10-CM | POA: Diagnosis not present

## 2015-05-24 DIAGNOSIS — E785 Hyperlipidemia, unspecified: Secondary | ICD-10-CM | POA: Insufficient documentation

## 2015-05-24 DIAGNOSIS — Z992 Dependence on renal dialysis: Secondary | ICD-10-CM | POA: Diagnosis not present

## 2015-05-24 DIAGNOSIS — F39 Unspecified mood [affective] disorder: Secondary | ICD-10-CM | POA: Insufficient documentation

## 2015-05-24 HISTORY — DX: Hyperparathyroidism, unspecified: E21.3

## 2015-05-24 HISTORY — DX: Other symptoms and signs involving cognitive functions and awareness: R41.89

## 2015-05-24 HISTORY — DX: Urinary tract infection, site not specified: N39.0

## 2015-05-24 HISTORY — PX: PERIPHERAL VASCULAR CATHETERIZATION: SHX172C

## 2015-05-24 HISTORY — DX: Type 2 diabetes mellitus without complications: E11.9

## 2015-05-24 HISTORY — DX: Methicillin susceptible Staphylococcus aureus infection, unspecified site: A49.01

## 2015-05-24 SURGERY — DIALYSIS/PERMA CATHETER REMOVAL
Anesthesia: Moderate Sedation

## 2015-05-24 MED ORDER — LIDOCAINE HCL (PF) 1 % IJ SOLN
INTRAMUSCULAR | Status: DC | PRN
  Administered 2015-05-24: 10 mL via INTRADERMAL

## 2015-05-24 SURGICAL SUPPLY — 5 items
CHLORAPREP W/TINT 26ML (MISCELLANEOUS) ×6 IMPLANT
FCP FG STRG 5.5XNS LF DISP (INSTRUMENTS) ×1
FORCEPS FG STRG 5.5XNS LF DISP (INSTRUMENTS) ×1 IMPLANT
FORCEPS KELLY 5.5 STR (INSTRUMENTS) ×2
TRAY LACERAT/PLASTIC (MISCELLANEOUS) ×3 IMPLANT

## 2015-05-24 NOTE — Discharge Instructions (Signed)
.  [facility] 

## 2015-05-24 NOTE — Op Note (Signed)
  OPERATIVE NOTE   PROCEDURE: 1. Removal of a right IJ tunneled dialysis catheter  PRE-OPERATIVE DIAGNOSIS: Complication of dialysis catheter, End stage renal disease  POST-OPERATIVE DIAGNOSIS: Same  SURGEON: Miabella Shannahan, Dolores Lory, M.D.  ANESTHESIA: Local anesthetic with 1% lidocaine with epinephrine   ESTIMATED BLOOD LOSS: Minimal   FINDING(S): 1. Catheter intact   SPECIMEN(S):  Catheter  INDICATIONS:   Kelly [name] is a 34 y.o. female who presents with catheter nonfunction and a functioning arm access.  The patient has undergone placement of an extremity access which is working and this has been successfully cannulated without difficulty.  therefore is undergoing removal of his tunneled catheter which is no longer needed to avoid septic complications.   DESCRIPTION: After obtaining full informed written consent, the patient was positioned supine. The right IJ catheter and surrounding area is prepped and draped in a sterile fashion. The cuff was localized by palpation and noted to be less than 3 cm from the exit site. After appropriate timeout is called, 1% lidocaine with epinephrine is infiltrated into the surrounding tissues around the cuff. Small transverse incision is created at the exit site with an 11 blade scalpel and the dissection was carried up along the catheter to expose the cuff of the tunneled catheter.  The catheter cuff is then freed from the surrounding attachments and adhesions. Once the catheter has been freed circumferentially it is removed in 1 piece. Light pressure was held at the base of the neck.   Antibiotic ointment and a sterile dressing is applied to the exit site. Patient tolerated procedure well and there were no complications.  COMPLICATIONS: None  CONDITION: Unchanged  Cosette Prindle, Dolores Lory, M.D. [facility] Office: [phone]  05/24/2015,9:56 PM

## 2015-05-25 ENCOUNTER — Encounter: Payer: Self-pay | Admitting: Vascular Surgery

## 2015-08-08 ENCOUNTER — Emergency Department
Admission: EM | Admit: 2015-08-08 | Discharge: 2015-08-08 | Payer: Medicaid Other | Attending: Emergency Medicine | Admitting: Emergency Medicine

## 2015-08-08 ENCOUNTER — Emergency Department: Payer: Medicaid Other

## 2015-08-08 ENCOUNTER — Encounter: Payer: Self-pay | Admitting: Emergency Medicine

## 2015-08-08 DIAGNOSIS — E11649 Type 2 diabetes mellitus with hypoglycemia without coma: Secondary | ICD-10-CM | POA: Insufficient documentation

## 2015-08-08 DIAGNOSIS — I120 Hypertensive chronic kidney disease with stage 5 chronic kidney disease or end stage renal disease: Secondary | ICD-10-CM | POA: Diagnosis not present

## 2015-08-08 DIAGNOSIS — N186 End stage renal disease: Secondary | ICD-10-CM | POA: Diagnosis not present

## 2015-08-08 DIAGNOSIS — Z992 Dependence on renal dialysis: Secondary | ICD-10-CM | POA: Insufficient documentation

## 2015-08-08 DIAGNOSIS — R410 Disorientation, unspecified: Secondary | ICD-10-CM | POA: Diagnosis not present

## 2015-08-08 DIAGNOSIS — Z7982 Long term (current) use of aspirin: Secondary | ICD-10-CM | POA: Insufficient documentation

## 2015-08-08 DIAGNOSIS — Z79899 Other long term (current) drug therapy: Secondary | ICD-10-CM | POA: Diagnosis not present

## 2015-08-08 DIAGNOSIS — E162 Hypoglycemia, unspecified: Secondary | ICD-10-CM

## 2015-08-08 DIAGNOSIS — Z794 Long term (current) use of insulin: Secondary | ICD-10-CM | POA: Insufficient documentation

## 2015-08-08 DIAGNOSIS — Z720 Tobacco use: Secondary | ICD-10-CM | POA: Insufficient documentation

## 2015-08-08 LAB — COMPREHENSIVE METABOLIC PANEL
ALK PHOS: 71 U/L (ref 38–126)
ALT: 7 U/L — ABNORMAL LOW (ref 14–54)
ANION GAP: 15 (ref 5–15)
AST: 15 U/L (ref 15–41)
Albumin: 3.7 g/dL (ref 3.5–5.0)
BILIRUBIN TOTAL: 0.7 mg/dL (ref 0.3–1.2)
BUN: 57 mg/dL — ABNORMAL HIGH (ref 6–20)
CALCIUM: 9.6 mg/dL (ref 8.9–10.3)
CO2: 29 mmol/L (ref 22–32)
Chloride: 92 mmol/L — ABNORMAL LOW (ref 101–111)
Creatinine, Ser: 11.31 mg/dL — ABNORMAL HIGH (ref 0.44–1.00)
GFR calc non Af Amer: 4 mL/min — ABNORMAL LOW (ref >60)
GFR, EST AFRICAN AMERICAN: 5 mL/min — AB (ref >60)
Glucose, Bld: 82 mg/dL (ref 65–99)
Potassium: 2.4 mmol/L — CL (ref 3.5–5.1)
Sodium: 136 mmol/L (ref 135–145)
TOTAL PROTEIN: 7.3 g/dL (ref 6.5–8.1)

## 2015-08-08 LAB — CBC
HCT: 35.7 % (ref 35.0–47.0)
Hemoglobin: 11.8 g/dL — ABNORMAL LOW (ref 12.0–16.0)
MCH: 33.3 pg (ref 26.0–34.0)
MCHC: 33 g/dL (ref 32.0–36.0)
MCV: 100.7 fL — AB (ref 80.0–100.0)
PLATELETS: 188 10*3/uL (ref 150–440)
RBC: 3.54 MIL/uL — ABNORMAL LOW (ref 3.80–5.20)
RDW: 18.6 % — AB (ref 11.5–14.5)
WBC: 5.3 10*3/uL (ref 3.6–11.0)

## 2015-08-08 LAB — GLUCOSE, CAPILLARY
Glucose-Capillary: 116 mg/dL — ABNORMAL HIGH (ref 65–99)
Glucose-Capillary: 83 mg/dL (ref 65–99)

## 2015-08-08 LAB — BLOOD GAS, VENOUS
Acid-Base Excess: 6.5 mmol/L — ABNORMAL HIGH (ref 0.0–3.0)
BICARBONATE: 34.2 meq/L — AB (ref 21.0–28.0)
PCO2 VEN: 62 mmHg — AB (ref 44.0–60.0)
PH VEN: 7.35 (ref 7.320–7.430)
Patient temperature: 37

## 2015-08-08 MED ORDER — POTASSIUM CHLORIDE CRYS ER 20 MEQ PO TBCR
40.0000 meq | EXTENDED_RELEASE_TABLET | Freq: Once | ORAL | Status: AC
  Administered 2015-08-08: 40 meq via ORAL

## 2015-08-08 MED ORDER — POTASSIUM CHLORIDE CRYS ER 20 MEQ PO TBCR
EXTENDED_RELEASE_TABLET | ORAL | Status: AC
Start: 2015-08-08 — End: 2015-08-08
  Administered 2015-08-08: 40 meq via ORAL
  Filled 2015-08-08: qty 2

## 2015-08-08 NOTE — ED Notes (Signed)
Potassium 2.4 Dr Dahlia Client

## 2015-08-08 NOTE — ED Provider Notes (Signed)
[facility] Emergency Department Provider Note  ____________________________________________  Time seen: Approximately 0043 AM  I have reviewed the triage vital signs and the nursing notes.   HISTORY  Chief Complaint Hypoglycemia  Patient somnolent and minimally responsive  HPI Kelly [name] is a 34 y.o. female with a history of mental retardation, end-stage renal disease on dialysis, schizoaffective disorder and hypertension who was sent in by her caretaker after being found unresponsive. EMS found the patient to have a blood sugar of 20 and reports that it appears as though the patient had vomited so she was given some glucagon and brought in for evaluation. After the glucagon the patient's blood sugar went to 79 and then in the ED was 81. The patient is very sleepy and she is unable to answer questions per EMS and the nurse. The patient did come in from a group home.   Past Medical History  Diagnosis Date  . Chronic kidney disease   . Hypertension   . Bipolar disorder   . Depression   . Depression   . Elevated lipids   . Cognitive changes   . Alcohol abuse     chronic  . Cognitive impairment   . Staph aureus infection     A/V fistula  . Diabetes mellitus without complication   . UTI (lower urinary tract infection)   . Hyperparathyroidism     Patient Active Problem List   Diagnosis Date Noted  . Diabetes 04/23/2015  . Essential hypertension 04/23/2015  . ESRD on dialysis 04/23/2015  . Pain of left arm 04/22/2015    Past Surgical History  Procedure Laterality Date  . Av fistula placement    . Cholecystectomy    . Dialysis perma cath Right     chest  . Vascular access device insertion Left 04/22/2015    Procedure: INSERTION OF HERO VASCULAR ACCESS DEVICE;  Surgeon: Katha Cabal, MD;  Location: [facility] ORS;  Service: Vascular;  Laterality: Left;  . Peripheral vascular catheterization N/A 05/24/2015    Procedure: Dialysis/Perma Catheter  Removal;  Surgeon: Katha Cabal, MD;  Location: [place] [facility];  Service: Cardiovascular;  Laterality: N/A;    Current Outpatient Rx  Name  Route  Sig  Dispense  Refill  . amLODipine (NORVASC) 10 MG tablet   Oral   Take 10 mg by mouth at bedtime.          Marland Kitchen aspirin EC 81 MG tablet   Oral   Take 81 mg by mouth daily.         . calcium acetate (PHOSLO) 667 MG capsule   Oral   Take 1,334 mg by mouth 2 (two) times daily.          . cinacalcet (SENSIPAR) 30 MG tablet   Oral   Take 30 mg by mouth daily.         . cloNIDine (CATAPRES) 0.2 MG tablet   Oral   Take 0.2 mg by mouth 2 (two) times daily.         . Multiple Vitamin (THEREMS PO)   Oral   Take 1 tablet by mouth daily.         . divalproex (DEPAKOTE) 500 MG DR tablet   Oral   Take 500 mg by mouth 3 (three) times daily.         Marland Kitchen docusate sodium (COLACE) 100 MG capsule   Oral   Take 100 mg by mouth 2 (two) times daily.         Marland Kitchen  escitalopram (LEXAPRO) 10 MG tablet   Oral   Take 15 mg by mouth daily.         . folic acid (FOLVITE) 1 MG tablet   Oral   Take 2 mg by mouth daily.         . Glucagon rDNA, Diagnostic, (GLUCAGON DIAGNOSTIC IJ)   Injection   Inject 1 Dose as directed daily as needed.         . hydrALAZINE (APRESOLINE) 50 MG tablet   Oral   Take 50 mg by mouth 3 (three) times daily.         . insulin aspart (NOVOLOG) 100 UNIT/ML injection   Subcutaneous   Inject 0-12 Units into the skin 3 (three) times daily before meals. Per sliding scale         . insulin glargine (LANTUS) 100 UNIT/ML injection   Subcutaneous   Inject 20 Units into the skin at bedtime.          . medroxyPROGESTERone (DEPO-PROVERA) 150 MG/ML injection   Intramuscular   Inject 150 mg into the muscle every 3 (three) months.         . metoprolol (LOPRESSOR) 50 MG tablet   Oral   Take 50 mg by mouth 2 (two) times daily.         Marland Kitchen nystatin cream (MYCOSTATIN)   Topical   Apply 1  application topically 4 (four) times daily.         Marland Kitchen oxyCODONE-acetaminophen (ROXICET) 5-325 MG per tablet   Oral   Take 1 tablet by mouth every 4 (four) hours as needed for severe pain.   20 tablet   0   . polyethylene glycol (MIRALAX / GLYCOLAX) packet   Oral   Take 17 g by mouth daily.         . QUEtiapine (SEROQUEL) 200 MG tablet   Oral   Take 200 mg by mouth 2 (two) times daily.          . traZODone (DESYREL) 100 MG tablet   Oral   Take 100 mg by mouth at bedtime.         . vitamin B-12 (CYANOCOBALAMIN) 1000 MCG tablet   Oral   Take 1,000 mcg by mouth daily.           Allergies Review of patient's allergies indicates no known allergies.  History reviewed. No pertinent family history.  Social History Social History  Substance Use Topics  . Smoking status: Current Every Day Smoker -- 1.00 packs/day for 0 years    Types: Cigarettes  . Smokeless tobacco: Current User  . Alcohol Use: No    Review of Systems Unable to assess    ____________________________________________   PHYSICAL EXAM:  VITAL SIGNS: ED Triage Vitals  Enc Vitals Group     BP 08/08/15 0025 122/67 mmHg     Pulse Rate 08/08/15 0025 65     Resp 08/08/15 0025 16     Temp 08/08/15 0025 99 F (37.2 C)     Temp Source 08/08/15 0025 Oral     SpO2 08/08/15 0025 96 %     Weight 08/08/15 0025 180 lb 6.4 oz (81.829 kg)     Height 08/08/15 0025 5\' 5"  (1.651 m)     Head Cir --      Peak Flow --      Pain Score --      Pain Loc --      Pain Edu? --  Excl. in [place]? --     Constitutional: Sleepy, Well appearing and in no acute distress. Eyes: Conjunctivae are normal. PERRL. EOMI. Head: Atraumatic. Nose: No congestion/rhinnorhea. Mouth/Throat: Mucous membranes are moist.  Oropharynx non-erythematous. NCardiovascular: Normal rate, regular rhythm. Grossly normal heart sounds.  Good peripheral circulation. Respiratory: Normal respiratory effort.  No retractions. Lungs  CTAB. Gastrointestinal: Soft and nontender. No distention. Positive bowel sounds  Musculoskeletal: No lower extremity tenderness nor edema.   Neurologic:  Normal speech and language.  Skin:  Skin is warm, dry and intact.  Psychiatric: Mood and affect are normal.   ____________________________________________   LABS (all labs ordered are listed, but only abnormal results are displayed)  Labs Reviewed  CBC - Abnormal; Notable for the following:    RBC 3.54 (*)    Hemoglobin 11.8 (*)    MCV 100.7 (*)    RDW 18.6 (*)    All other components within normal limits  COMPREHENSIVE METABOLIC PANEL - Abnormal; Notable for the following:    Potassium 2.4 (*)    Chloride 92 (*)    BUN 57 (*)    Creatinine, Ser 11.31 (*)    ALT 7 (*)    GFR calc non Af Amer 4 (*)    GFR calc Af Amer 5 (*)    All other components within normal limits  BLOOD GAS, VENOUS - Abnormal; Notable for the following:    pCO2, Ven 62 (*)    Bicarbonate 34.2 (*)    Acid-Base Excess 6.5 (*)    All other components within normal limits  GLUCOSE, CAPILLARY - Abnormal; Notable for the following:    Glucose-Capillary 116 (*)    All other components within normal limits  GLUCOSE, CAPILLARY  URINALYSIS COMPLETEWITH MICROSCOPIC ([facility] ONLY)  CBG MONITORING, ED  CBG MONITORING, ED  CBG MONITORING, ED   ____________________________________________  EKG  ED ECG REPORT I, Loney Hering, the attending physician, personally viewed and interpreted this ECG.   Date: 08/08/2015  EKG Time: 022  Rate: 67  Rhythm: normal sinus rhythm  Axis: normal  Intervals:none  ST&T Change: Flipped T waves in leads 1 and aVL T wave flattening in V5 and V6  ____________________________________________  RADIOLOGY  CT head: No acute intracranial abnormality, age advanced atrophy and volume loss, stable and agree from prior exam ____________________________________________   PROCEDURES  Procedure(s) performed:  None  Critical Care performed: No  ____________________________________________   INITIAL IMPRESSION / ASSESSMENT AND PLAN / ED COURSE  Pertinent labs & imaging results that were available during my care of the patient were reviewed by me and considered in my medical decision making (see chart for details).  This is a 34 year old female who comes in today with unresponsiveness and hypoglycemia. The patient received glucagon by EMS and her blood sugars continued to increase. The patient became arousable and was speaking in full sentences but seemed to be a little confused which may be due to her mental retardation and schizoaffective disease. Since the patient was able to arouse we did give her some food to eat as well. At this time I feel the patient is appropriate to be discharged home to receive dialysis. The patient's care was signed out to Dr. Marcelene Butte who will arrange for the patient to be taken home to receive dialysis or discharge the patient's to home ____________________________________________   FINAL CLINICAL IMPRESSION(S) / ED DIAGNOSES  Final diagnoses:  Confusion  Hypoglycemia      Loney Hering, MD 08/08/15 0730

## 2015-08-08 NOTE — ED Notes (Signed)
Pt given milk and sandwich to eat, given warm blankets

## 2015-08-08 NOTE — ED Notes (Signed)
Patient presents to Emergency Department via EMS with complaints of hypoglycemia.  Pt from [facility] group home, found unresponsive, pt had vomited, EMS CBG 20, gave 1 of glucagon oral, CBG 79.  Pt left arm shunt, dialysis M,W &F. Pt responds to pain, mental retardation on transfer sheet. Pt oriented to self only.

## 2015-08-08 NOTE — ED Provider Notes (Signed)
-----------------------------------------   7:45 AM on 08/08/2015 -----------------------------------------   Blood pressure 122/67, pulse 65, temperature 99 F (37.2 C), temperature source Oral, resp. rate 16, height 5\' 5"  (1.651 m), weight 180 lb 6.4 oz (81.829 kg), SpO2 96 %.  Assuming care from Dr. Dahlia Client.  In short, Kelly [name] is a 34 y.o. female with a chief complaint of Hypoglycemia .  Refer to the original H&P for additional details.  The current plan of care is to transfer back to the group home and continue outpatient treatment with her dialysis which is scheduled for today. Currently going to leave the patient on her medications. Frequent blood checks of her blood sugar at the home facility. Potassium should correct itself with dialysis, etc. Patient was given oral potassium here is able tolerate by mouth intake.   Daymon Larsen, MD 08/08/15 [phone]

## 2015-08-08 NOTE — ED Notes (Signed)
CBG 152  

## 2015-08-08 NOTE — ED Notes (Signed)
Rocky at [facility] [phone], gave me owners number Chrystie Nose [phone].  Called and Bruce to pick-up pt by 0840.

## 2015-08-08 NOTE — ED Notes (Signed)
Pt discharged home after verbalizing understanding of discharge instructions; nad noted. 

## 2015-08-08 NOTE — ED Notes (Signed)
Patient transported to CT 

## 2015-08-08 NOTE — Discharge Instructions (Signed)
Blood Glucose Monitoring °Monitoring your blood glucose (also know as blood sugar) helps you to manage your diabetes. It also helps you and your health care provider monitor your diabetes and determine how well your treatment plan is working. °WHY SHOULD YOU MONITOR YOUR BLOOD GLUCOSE? °· It can help you understand how food, exercise, and medicine affect your blood glucose. °· It allows you to know what your blood glucose is at any given moment. You can quickly tell if you are having low blood glucose (hypoglycemia) or high blood glucose (hyperglycemia). °· It can help you and your health care provider know how to adjust your medicines. °· It can help you understand how to manage an illness or adjust medicine for exercise. °WHEN SHOULD YOU TEST? °Your health care provider will help you decide how often you should check your blood glucose. This may depend on the type of diabetes you have, your diabetes control, or the types of medicines you are taking. Be sure to write down all of your blood glucose readings so that this information can be reviewed with your health care provider. See below for examples of testing times that your health care provider may suggest. °Type 1 Diabetes °· Test 4 times a day if you are in good control, using an insulin pump, or perform multiple daily injections. °· If your diabetes is not well controlled or if you are sick, you may need to monitor more often. °· It is a good idea to also monitor: °· Before and after exercise. °· Between meals and 2 hours after a meal. °· Occasionally between 2:00 a.m. and 3:00 a.m. °Type 2 Diabetes °· It can vary with each person, but generally, if you are on insulin, test 4 times a day. °· If you take medicines by mouth (orally), test 2 times a day. °· If you are on a controlled diet, test once a day. °· If your diabetes is not well controlled or if you are sick, you may need to monitor more often. °HOW TO MONITOR YOUR BLOOD GLUCOSE °Supplies  Needed °· Blood glucose meter. °· Test strips for your meter. Each meter has its own strips. You must use the strips that go with your own meter. °· A pricking needle (lancet). °· A device that holds the lancet (lancing device). °· A journal or log book to write down your results. °Procedure °· Wash your hands with soap and water. Alcohol is not preferred. °· Prick the side of your finger (not the tip) with the lancet. °· Gently milk the finger until a small drop of blood appears. °· Follow the instructions that come with your meter for inserting the test strip, applying blood to the strip, and using your blood glucose meter. °Other Areas to Get Blood for Testing °Some meters allow you to use other areas of your body (other than your finger) to test your blood. These areas are called alternative sites. The most common alternative sites are: °· The forearm. °· The thigh. °· The back area of the lower leg. °· The palm of the hand. °The blood flow in these areas is slower. Therefore, the blood glucose values you get may be delayed, and the numbers are different from what you would get from your fingers. Do not use alternative sites if you think you are having hypoglycemia. Your reading will not be accurate. Always use a finger if you are having hypoglycemia. Also, if you cannot feel your lows (hypoglycemia unawareness), always use your fingers for your   blood glucose checks. °ADDITIONAL TIPS FOR GLUCOSE MONITORING °· Do not reuse lancets. °· Always carry your supplies with you. °· All blood glucose meters have a 24-hour "hotline" number to call if you have questions or need help. °· Adjust (calibrate) your blood glucose meter with a control solution after finishing a few boxes of strips. °BLOOD GLUCOSE RECORD KEEPING °It is a good idea to keep a daily record or log of your blood glucose readings. Most glucose meters, if not all, keep your glucose records stored in the meter. Some meters come with the ability to download  your records to your home computer. Keeping a record of your blood glucose readings is especially helpful if you are wanting to look for patterns. Make notes to go along with the blood glucose readings because you might forget what happened at that exact time. Keeping good records helps you and your health care provider to work together to achieve good diabetes management.  °Document Released: 11/15/2003 Document Revised: 03/29/2014 Document Reviewed: 04/06/2013 °[employer]® Patient Information ©2015 [employer]. This information is not intended to replace advice given to you by your health care provider. Make sure you discuss any questions you have with your health care provider. ° °Hypoglycemia °Hypoglycemia occurs when the glucose in your blood is too low. Glucose is a type of sugar that is your body's main energy source. Hormones, such as insulin and glucagon, control the level of glucose in the blood. Insulin lowers blood glucose and glucagon increases blood glucose. Having too much insulin in your blood stream, or not eating enough food containing sugar, can result in hypoglycemia. Hypoglycemia can happen to people with or without diabetes. It can develop quickly and can be a medical emergency.  °CAUSES  °· Missing or delaying meals. °· Not eating enough carbohydrates at meals. °· Taking too much diabetes medicine. °· Not timing your oral diabetes medicine or insulin doses with meals, snacks, and exercise. °· Nausea and vomiting. °· Certain medicines. °· Severe illnesses, such as hepatitis, kidney disorders, and certain eating disorders. °· Increased activity or exercise without eating something extra or adjusting medicines. °· Drinking too much alcohol. °· A nerve disorder that affects body functions like your heart rate, blood pressure, and digestion (autonomic neuropathy). °· A condition where the stomach muscles do not function properly (gastroparesis). Therefore, medicines and food may not absorb  properly. °· Rarely, a tumor of the pancreas can produce too much insulin. °SYMPTOMS  °· Hunger. °· Sweating (diaphoresis). °· Change in body temperature. °· Shakiness. °· Headache. °· Anxiety. °· Lightheadedness. °· Irritability. °· Difficulty concentrating. °· Dry mouth. °· Tingling or numbness in the hands or feet. °· Restless sleep or sleep disturbances. °· Altered speech and coordination. °· Change in mental status. °· Seizures or prolonged convulsions. °· Combativeness. °· Drowsiness (lethargic). °· Weakness. °· Increased heart rate or palpitations. °· Confusion. °· Pale, gray skin color. °· Blurred or double vision. °· Fainting. °DIAGNOSIS  °A physical exam and medical history will be performed. Your caregiver may make a diagnosis based on your symptoms. Blood tests and other lab tests may be performed to confirm a diagnosis. Once the diagnosis is made, your caregiver will see if your signs and symptoms go away once your blood glucose is raised.  °TREATMENT  °Usually, you can easily treat your hypoglycemia when you notice symptoms. °· Check your blood glucose. If it is less than 70 mg/dl, take one of the following:   °¨ 3-4 glucose tablets.   °¨ ½ cup   juice.    cup regular soda.   1 cup skim milk.   -1 tube of glucose gel.   5-6 hard candies.   Avoid high-fat drinks or food that may delay a rise in blood glucose levels.  Do not take more than the recommended amount of sugary foods, drinks, gel, or tablets. Doing so will cause your blood glucose to go too high.   Wait 10-15 minutes and recheck your blood glucose. If it is still less than 70 mg/dl or below your target range, repeat treatment.   Eat a snack if it is more than 1 hour until your next meal.  There may be a time when your blood glucose may go so low that you are unable to treat yourself at home when you start to notice symptoms. You may need someone to help you. You may even faint or be unable to swallow. If you cannot  treat yourself, someone will need to bring you to the hospital.  [place]  If you have diabetes, follow your diabetes management plan by:  Taking your medicines as directed.  Following your exercise plan.  Following your meal plan. Do not skip meals. Eat on time.  Testing your blood glucose regularly. Check your blood glucose before and after exercise. If you exercise longer or different than usual, be sure to check blood glucose more frequently.  Wearing your medical alert jewelry that says you have diabetes.  Identify the cause of your hypoglycemia. Then, develop ways to prevent the recurrence of hypoglycemia.  Do not take a hot bath or shower right after an insulin shot.  Always carry treatment with you. Glucose tablets are the easiest to carry.  If you are going to drink alcohol, drink it only with meals.  Tell friends or family members ways to keep you safe during a seizure. This may include removing hard or sharp objects from the area or turning you on your side.  Maintain a healthy weight. SEEK MEDICAL CARE IF:   You are having problems keeping your blood glucose in your target range.  You are having frequent episodes of hypoglycemia.  You feel you might be having side effects from your medicines.  You are not sure why your blood glucose is dropping so low.  You notice a change in vision or a new problem with your vision. SEEK IMMEDIATE MEDICAL CARE IF:   Confusion develops.  A change in mental status occurs.  The inability to swallow develops.  Fainting occurs. Document Released: 11/12/2005 Document Revised: 11/17/2013 Document Reviewed: 03/10/2012 [facility] Patient Information 2015 [place], [place]. This information is not intended to replace advice given to you by your health care provider. Make sure you discuss any questions you have with your health care provider.   Please return immediately if condition worsens. Please contact her primary  physician or the physician you were given for referral. If you have any specialist physicians involved in her treatment and plan please also contact them. Thank you for using [facility]. Please contact and follow-up with the dialysis later today. Continue current medications.

## 2015-08-09 LAB — GLUCOSE, CAPILLARY
GLUCOSE-CAPILLARY: 154 mg/dL — AB (ref 65–99)
Glucose-Capillary: 154 mg/dL — ABNORMAL HIGH (ref 65–99)

## 2016-01-07 ENCOUNTER — Encounter: Payer: Self-pay | Admitting: Emergency Medicine

## 2016-01-07 ENCOUNTER — Emergency Department
Admission: EM | Admit: 2016-01-07 | Discharge: 2016-01-08 | Disposition: A | Discharge status: Home / Self Care | Payer: Medicaid Other | Attending: Emergency Medicine | Admitting: Emergency Medicine

## 2016-01-07 DIAGNOSIS — Z79899 Other long term (current) drug therapy: Secondary | ICD-10-CM | POA: Diagnosis not present

## 2016-01-07 DIAGNOSIS — Z3202 Encounter for pregnancy test, result negative: Secondary | ICD-10-CM | POA: Insufficient documentation

## 2016-01-07 DIAGNOSIS — I120 Hypertensive chronic kidney disease with stage 5 chronic kidney disease or end stage renal disease: Secondary | ICD-10-CM | POA: Diagnosis not present

## 2016-01-07 DIAGNOSIS — Z794 Long term (current) use of insulin: Secondary | ICD-10-CM | POA: Diagnosis not present

## 2016-01-07 DIAGNOSIS — Z7982 Long term (current) use of aspirin: Secondary | ICD-10-CM | POA: Diagnosis not present

## 2016-01-07 DIAGNOSIS — E11649 Type 2 diabetes mellitus with hypoglycemia without coma: Secondary | ICD-10-CM | POA: Diagnosis present

## 2016-01-07 DIAGNOSIS — N186 End stage renal disease: Secondary | ICD-10-CM | POA: Insufficient documentation

## 2016-01-07 DIAGNOSIS — F17210 Nicotine dependence, cigarettes, uncomplicated: Secondary | ICD-10-CM | POA: Diagnosis not present

## 2016-01-07 DIAGNOSIS — E162 Hypoglycemia, unspecified: Secondary | ICD-10-CM

## 2016-01-07 LAB — HCG, QUANTITATIVE, PREGNANCY: hCG, Beta Chain, Quant, S: 2 m[IU]/mL (ref <5)

## 2016-01-07 LAB — CBC
HEMATOCRIT: 31.1 % — AB (ref 35.0–47.0)
HEMOGLOBIN: 10.2 g/dL — AB (ref 12.0–16.0)
MCH: 32.9 pg (ref 26.0–34.0)
MCHC: 32.9 g/dL (ref 32.0–36.0)
MCV: 100 fL (ref 80.0–100.0)
Platelets: 192 10*3/uL (ref 150–440)
RBC: 3.11 MIL/uL — ABNORMAL LOW (ref 3.80–5.20)
RDW: 16.4 % — AB (ref 11.5–14.5)
WBC: 4.8 10*3/uL (ref 3.6–11.0)

## 2016-01-07 LAB — BASIC METABOLIC PANEL
Anion gap: 12 (ref 5–15)
BUN: 30 mg/dL — ABNORMAL HIGH (ref 6–20)
CHLORIDE: 93 mmol/L — AB (ref 101–111)
CO2: 32 mmol/L (ref 22–32)
CREATININE: 6.96 mg/dL — AB (ref 0.44–1.00)
Calcium: 9.6 mg/dL (ref 8.9–10.3)
GFR calc non Af Amer: 7 mL/min — ABNORMAL LOW (ref >60)
GFR, EST AFRICAN AMERICAN: 8 mL/min — AB (ref >60)
GLUCOSE: 314 mg/dL — AB (ref 65–99)
Potassium: 4.2 mmol/L (ref 3.5–5.1)
Sodium: 137 mmol/L (ref 135–145)

## 2016-01-07 LAB — GLUCOSE, CAPILLARY
GLUCOSE-CAPILLARY: 280 mg/dL — AB (ref 65–99)
GLUCOSE-CAPILLARY: 350 mg/dL — AB (ref 65–99)
Glucose-Capillary: 378 mg/dL — ABNORMAL HIGH (ref 65–99)
Glucose-Capillary: 323 mg/dL — ABNORMAL HIGH (ref 65–99)
Glucose-Capillary: 321 mg/dL — ABNORMAL HIGH (ref 65–99)

## 2016-01-07 LAB — VALPROIC ACID LEVEL

## 2016-01-07 MED ORDER — CLONIDINE HCL 0.1 MG PO TABS
0.1000 mg | ORAL_TABLET | Freq: Once | ORAL | Status: AC
  Administered 2016-01-07: 0.1 mg via ORAL
  Filled 2016-01-07: qty 1
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
## 2016-01-07 NOTE — Discharge Instructions (Signed)
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
## 2016-01-07 NOTE — ED Notes (Signed)
Spoke with Kelly [name] at [facility], [phone], they are coming to pick patient up at this time. NAD noted. Pt moved to 12H at this time.

## 2016-01-18 ENCOUNTER — Emergency Department
Admission: EM | Admit: 2016-01-18 | Discharge: 2016-01-19 | Disposition: A | Discharge status: Home / Self Care | Payer: Medicaid Other | Source: Home / Self Care | Attending: Emergency Medicine | Admitting: Emergency Medicine

## 2016-01-18 DIAGNOSIS — B349 Viral infection, unspecified: Secondary | ICD-10-CM

## 2016-01-18 DIAGNOSIS — Z7982 Long term (current) use of aspirin: Secondary | ICD-10-CM | POA: Insufficient documentation

## 2016-01-18 DIAGNOSIS — Z794 Long term (current) use of insulin: Secondary | ICD-10-CM | POA: Insufficient documentation

## 2016-01-18 DIAGNOSIS — I120 Hypertensive chronic kidney disease with stage 5 chronic kidney disease or end stage renal disease: Secondary | ICD-10-CM

## 2016-01-18 DIAGNOSIS — Z79899 Other long term (current) drug therapy: Secondary | ICD-10-CM

## 2016-01-18 DIAGNOSIS — F17210 Nicotine dependence, cigarettes, uncomplicated: Secondary | ICD-10-CM | POA: Insufficient documentation

## 2016-01-18 DIAGNOSIS — Z992 Dependence on renal dialysis: Secondary | ICD-10-CM | POA: Insufficient documentation

## 2016-01-18 DIAGNOSIS — N186 End stage renal disease: Secondary | ICD-10-CM

## 2016-01-18 DIAGNOSIS — R300 Dysuria: Secondary | ICD-10-CM

## 2016-01-18 DIAGNOSIS — R112 Nausea with vomiting, unspecified: Secondary | ICD-10-CM | POA: Diagnosis present

## 2016-01-18 DIAGNOSIS — E119 Type 2 diabetes mellitus without complications: Secondary | ICD-10-CM | POA: Insufficient documentation

## 2016-01-18 DIAGNOSIS — R1084 Generalized abdominal pain: Secondary | ICD-10-CM

## 2016-01-18 LAB — CBC WITH DIFFERENTIAL/PLATELET
BASOS PCT: 1 %
Basophils Absolute: 0 10*3/uL (ref 0–0.1)
EOS ABS: 0.2 10*3/uL (ref 0–0.7)
EOS PCT: 3 %
HEMATOCRIT: 33.2 % — AB (ref 35.0–47.0)
HEMOGLOBIN: 10.8 g/dL — AB (ref 12.0–16.0)
LYMPHS ABS: 2.2 10*3/uL (ref 1.0–3.6)
Lymphocytes Relative: 37 %
MCH: 32.1 pg (ref 26.0–34.0)
MCHC: 32.6 g/dL (ref 32.0–36.0)
MCV: 98.6 fL (ref 80.0–100.0)
Monocytes Absolute: 0.3 10*3/uL (ref 0.2–0.9)
Monocytes Relative: 6 %
Neutro Abs: 3.3 10*3/uL (ref 1.4–6.5)
Neutrophils Relative %: 53 %
Platelets: 168 10*3/uL (ref 150–440)
RBC: 3.37 MIL/uL — AB (ref 3.80–5.20)
RDW: 15.8 % — ABNORMAL HIGH (ref 11.5–14.5)
WBC: 6.1 10*3/uL (ref 3.6–11.0)

## 2016-01-18 LAB — URINALYSIS COMPLETE WITH MICROSCOPIC (ARMC ONLY)
BACTERIA UA: NONE SEEN
Bilirubin Urine: NEGATIVE
Glucose, UA: >500 mg/dL — AB
HGB URINE DIPSTICK: NEGATIVE
Ketones, ur: NEGATIVE mg/dL
NITRITE: NEGATIVE
PH: 9 — AB (ref 5.0–8.0)
PROTEIN: 100 mg/dL — AB
SPECIFIC GRAVITY, URINE: 1.008 (ref 1.005–1.030)

## 2016-01-18 LAB — GLUCOSE, CAPILLARY: Glucose-Capillary: 216 mg/dL — ABNORMAL HIGH (ref 65–99)

## 2016-01-18 LAB — COMPREHENSIVE METABOLIC PANEL
ALK PHOS: 69 U/L (ref 38–126)
ALT: 12 U/L — AB (ref 14–54)
AST: 16 U/L (ref 15–41)
Albumin: 4.1 g/dL (ref 3.5–5.0)
Anion gap: 8 (ref 5–15)
BUN: 18 mg/dL (ref 6–20)
CALCIUM: 9.5 mg/dL (ref 8.9–10.3)
CO2: 36 mmol/L — ABNORMAL HIGH (ref 22–32)
CREATININE: 5.74 mg/dL — AB (ref 0.44–1.00)
Chloride: 97 mmol/L — ABNORMAL LOW (ref 101–111)
GFR, EST AFRICAN AMERICAN: 10 mL/min — AB (ref >60)
GFR, EST NON AFRICAN AMERICAN: 9 mL/min — AB (ref >60)
Glucose, Bld: 140 mg/dL — ABNORMAL HIGH (ref 65–99)
Potassium: 3.2 mmol/L — ABNORMAL LOW (ref 3.5–5.1)
Sodium: 141 mmol/L (ref 135–145)
TOTAL PROTEIN: 7.4 g/dL (ref 6.5–8.1)
Total Bilirubin: 0.5 mg/dL (ref 0.3–1.2)

## 2016-01-18 LAB — LIPASE, BLOOD: LIPASE: 36 U/L (ref 11–51)

## 2016-01-18 MED ORDER — POTASSIUM CHLORIDE CRYS ER 20 MEQ PO TBCR
40.0000 meq | EXTENDED_RELEASE_TABLET | Freq: Once | ORAL | Status: AC
Start: 2016-01-18 — End: 2016-01-18
  Administered 2016-01-18: 40 meq via ORAL
  Filled 2016-01-18: qty 2

## 2016-01-18 MED ORDER — RANITIDINE HCL 150 MG PO CAPS: 150.0000 mg | ORAL_CAPSULE | Freq: Two times a day (BID) | ORAL | Status: DC

## 2016-01-18 MED ORDER — CLONIDINE HCL 0.1 MG PO TABS
0.2000 mg | ORAL_TABLET | Freq: Once | ORAL | Status: AC
  Administered 2016-01-18: 0.2 mg via ORAL
  Filled 2016-01-18: qty 2

## 2016-01-18 MED ORDER — ONDANSETRON 4 MG PO TBDP: 4.0000 mg | ORAL_TABLET | Freq: Three times a day (TID) | ORAL | Status: DC | PRN

## 2016-01-18 NOTE — ED Notes (Signed)
Pt unable to provide a urine specimen at this time
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
## 2016-01-18 NOTE — ED Notes (Signed)
Current Guardian:  [employer] with Ms. Thomas--[phone]

## 2016-01-18 NOTE — ED Notes (Signed)
Pt given juice and encouraged to void, unable to go at this time

## 2016-01-18 NOTE — ED Notes (Signed)
Pt arrived via EMS c/o abdominal pain. Per EMS pt went to dialysis this morning and starting having abdominal pain shortly after. Pt denies nausea and vomiting, but states she has a headache as well.

## 2016-01-18 NOTE — ED Provider Notes (Signed)
[facility] Emergency Department Provider Note  ____________________________________________  Time seen: 1:15 PM  I have reviewed the triage vital signs and the nursing notes.   HISTORY  Chief Complaint Abdominal Pain    HPI Kelly [name] is a 35 y.o. female brought to the ED via EMS for abdominal pain. Patient reports that she has generalized abdominal pain it's actually been going on for 3 days associated with some occasional nausea and vomiting and loose bowel movements. She also reports that she's had rhinorrhea and a nonproductive cough over the last 3 days as well. Denies fever chills chest pain shortness of breath or dizziness. She is still eating and drinking normally. She reports compliance with her dialysis 3 times weekly without any problems during her recent sessions.     Past Medical History  Diagnosis Date  . Chronic kidney disease   . Hypertension   . Bipolar disorder ([place])   . Depression   . Depression   . Elevated lipids   . Cognitive changes   . Alcohol abuse     chronic  . Cognitive impairment   . Staph aureus infection     A/V fistula  . Diabetes mellitus without complication ([place])   . UTI (lower urinary tract infection)   . Hyperparathyroidism [facility])      Patient Active Problem List   Diagnosis Date Noted  . Diabetes ([place]) 04/23/2015  . Essential hypertension 04/23/2015  . ESRD on dialysis ([place]) 04/23/2015  . Pain of left arm 04/22/2015     Past Surgical History  Procedure Laterality Date  . Av fistula placement    . Cholecystectomy    . Dialysis perma cath Right     chest  . Vascular access device insertion Left 04/22/2015    Procedure: INSERTION OF HERO VASCULAR ACCESS DEVICE;  Surgeon: Katha Cabal, MD;  Location: [facility] ORS;  Service: Vascular;  Laterality: Left;  . Peripheral vascular catheterization N/A 05/24/2015    Procedure: Dialysis/Perma Catheter Removal;  Surgeon: Katha Cabal, MD;  Location:  [place] CV LAB;  Service: Cardiovascular;  Laterality: N/A;     Current Outpatient Rx  Name  Route  Sig  Dispense  Refill  . amLODipine (NORVASC) 10 MG tablet   Oral   Take 10 mg by mouth at bedtime. *hold for systolic blood pressure [id number], call MD if holding this medication.*         . aspirin 81 MG chewable tablet   Oral   Chew 81 mg by mouth daily.         . B Complex-C-Folic Acid (RENA-VITE RX PO)   Oral   Take 1 tablet by mouth daily.         . calcium acetate (PHOSLO) 667 MG capsule   Oral   Take 1,334 mg by mouth 2 (two) times daily with a meal.          . cinacalcet (SENSIPAR) 60 MG tablet   Oral   Take 60 mg by mouth daily.         . cloNIDine (CATAPRES) 0.2 MG tablet   Oral   Take 0.2 mg by mouth 2 (two) times daily.         . divalproex (DEPAKOTE) 500 MG DR tablet   Oral   Take 1,000 mg by mouth at bedtime.          . docusate sodium (COLACE) 100 MG capsule   Oral   Take 100 mg by mouth  at bedtime.          Marland Kitchen escitalopram (LEXAPRO) 10 MG tablet   Oral   Take 15 mg by mouth daily.         . folic acid (FOLVITE) 1 MG tablet   Oral   Take 2 mg by mouth at bedtime.          Marland Kitchen glucagon (GLUCAGEN HYPOKIT) 1 MG SOLR injection   Intramuscular   Inject 1 mg into the muscle once as needed for low blood sugar. For emergency use blood glucose <75 and not responsive.         . hydrALAZINE (APRESOLINE) 50 MG tablet   Oral   Take 50 mg by mouth 3 (three) times daily.         . insulin aspart (NOVOLOG) 100 UNIT/ML injection   Subcutaneous   Inject 0-12 Units into the skin 3 (three) times daily before meals. Per sliding scale         . insulin glargine (LANTUS) 100 unit/mL SOPN   Subcutaneous   Inject 15 Units into the skin at bedtime.         . lidocaine-prilocaine (EMLA) cream   Topical   Apply 1 application topically 3 (three) times a week. Apply to access site 30 minutes before dialysis.         Marland Kitchen medroxyPROGESTERone  (DEPO-PROVERA) 150 MG/ML injection   Intramuscular   Inject 150 mg into the muscle every 3 (three) months.         . metoprolol (LOPRESSOR) 50 MG tablet   Oral   Take 50 mg by mouth 2 (two) times daily.         . Multiple Vitamin (THEREMS PO)   Oral   Take 1 tablet by mouth daily.         . ondansetron (ZOFRAN ODT) 4 MG disintegrating tablet   Oral   Take 1 tablet (4 mg total) by mouth every 8 (eight) hours as needed for nausea or vomiting.   20 tablet   0   . oxyCODONE-acetaminophen (ROXICET) 5-325 MG per tablet   Oral   Take 1 tablet by mouth every 4 (four) hours as needed for severe pain.   20 tablet   0   . polyethylene glycol (MIRALAX / GLYCOLAX) packet   Oral   Take 17 g by mouth daily as needed for mild constipation, moderate constipation or severe constipation. Mix with 8 oz. of juice.         Marland Kitchen QUEtiapine (SEROQUEL) 200 MG tablet   Oral   Take 200 mg by mouth 2 (two) times daily.          . ranitidine (ZANTAC) 150 MG capsule   Oral   Take 1 capsule (150 mg total) by mouth 2 (two) times daily.   28 capsule   0   . traZODone (DESYREL) 100 MG tablet   Oral   Take 100 mg by mouth at bedtime.         . vitamin B-12 (CYANOCOBALAMIN) 1000 MCG tablet   Oral   Take 1,000 mcg by mouth at bedtime.             Allergies Review of patient's allergies indicates no known allergies.   History reviewed. No pertinent family history.  Social History Social History  Substance Use Topics  . Smoking status: Current Every Day Smoker -- 1.00 packs/day for 0 years    Types: Cigarettes  . Smokeless tobacco: Current User  .  Alcohol Use: No    Review of Systems  Constitutional:   No fever or chills. No weight changes Eyes:   No blurry vision or double vision.  ENT:   No sore throat.  Cardiovascular:   No chest pain. Respiratory:   No dyspnea or cough. Gastrointestinal:   Abdominal pain with vomiting and diarrhea.  No BRBPR or melena. Genitourinary:    Positive dysuria  Musculoskeletal:   Negative for back pain. No joint swelling or pain. Skin:   Negative for rash. Neurological:   Negative for headaches, focal weakness or numbness. Psychiatric:  No anxiety or depression.   Endocrine:  No changes in energy or sleep difficulty.  10-point ROS otherwise negative.  ____________________________________________   PHYSICAL EXAM:  VITAL SIGNS: ED Triage Vitals  Enc Vitals Group     BP 01/18/16 1252 174/91 mmHg     Pulse Rate 01/18/16 1252 85     Resp 01/18/16 1252 19     Temp 01/18/16 1252 98.7 F (37.1 C)     Temp Source 01/18/16 1252 Oral     SpO2 01/18/16 1252 99 %     Weight 01/18/16 1252 186 lb 8 oz (84.596 kg)     Height 01/18/16 1252 5\' 2"  (1.575 m)     Head Cir --      Peak Flow --      Pain Score 01/18/16 1303 10     Pain Loc --      Pain Edu? --      Excl. in [place]? --     Vital signs reviewed, nursing assessments reviewed.   Constitutional:   Alert and oriented. Well appearing and in no distress. Eyes:   No scleral icterus. No conjunctival pallor. PERRL. EOMI ENT   Head:   Normocephalic and atraumatic.   Nose:   No congestion/rhinnorhea. No septal hematoma   Mouth/Throat:   MMM, no pharyngeal erythema. No peritonsillar mass.    Neck:   No stridor. No SubQ emphysema. No meningismus. Hematological/Lymphatic/Immunilogical:   No cervical lymphadenopathy. Cardiovascular:   RRR. Symmetric bilateral radial and DP pulses.  No murmurs.  Respiratory:   Normal respiratory effort without tachypnea nor retractions. Breath sounds are clear and equal bilaterally. No wheezes/rales/rhonchi. Gastrointestinal:   Soft and nontender. Non distended. There is no CVA tenderness.  No rebound, rigidity, or guarding. Genitourinary:   deferred Musculoskeletal:   Nontender with normal range of motion in all extremities. No joint effusions.  No lower extremity tenderness.  No edema. Neurologic:   Normal speech and language.  CN  2-10 normal. Motor grossly intact. No gross focal neurologic deficits are appreciated.  Skin:    Skin is warm, dry and intact. No rash noted.  No petechiae, purpura, or bullae. Psychiatric:   Mood and affect are normal. ____________________________________________    LABS (pertinent positives/negatives) (all labs ordered are listed, but only abnormal results are displayed) Labs Reviewed  GLUCOSE, CAPILLARY - Abnormal; Notable for the following:    Glucose-Capillary 216 (*)    All other components within normal limits  COMPREHENSIVE METABOLIC PANEL - Abnormal; Notable for the following:    Potassium 3.2 (*)    Chloride 97 (*)    CO2 36 (*)    Glucose, Bld 140 (*)    Creatinine, Ser 5.74 (*)    ALT 12 (*)    GFR calc non Af Amer 9 (*)    GFR calc Af Amer 10 (*)    All other components within normal limits  CBC WITH DIFFERENTIAL/PLATELET - Abnormal; Notable for the following:    RBC 3.37 (*)    Hemoglobin 10.8 (*)    HCT 33.2 (*)    RDW 15.8 (*)    All other components within normal limits  LIPASE, BLOOD  URINALYSIS COMPLETEWITH MICROSCOPIC ([facility] ONLY)   ____________________________________________   EKG  Interpreted by me Sinus rhythm rate of 86, normal axis and intervals.  Progression in anterior precordial leads. Normal ST segments and T waves.  ____________________________________________    RADIOLOGY    ____________________________________________   PROCEDURES   ____________________________________________   INITIAL IMPRESSION / ASSESSMENT AND PLAN / ED COURSE  Pertinent labs & imaging results that were available during my care of the patient were reviewed by me and considered in my medical decision making (see chart for details).  Patient resents with abdominal pain and constellation of other symptoms most consistent with a viral syndrome.Considering the patient's symptoms, medical history, and physical examination today, I have low suspicion for  cholecystitis or biliary pathology, pancreatitis, perforation or bowel obstruction, hernia, intra-abdominal abscess, AAA or dissection, volvulus or intussusception, mesenteric ischemia, or appendicitis. No evidence of ascites.  Patient is hungry and actually requested the chips and she has that she brought with her. She is tolerating that without any difficulty. Labs are unremarkable. Still waiting for urinalysis at which point I think the patient can be discharged home to follow up with primary care. We'll give her oral potassium replacement for her borderline low potassium of 3.2.    ----------------------------------------- 7:36 PM on 01/18/2016 -----------------------------------------  Urinalysis unremarkable. We'll discharge home.   ____________________________________________   FINAL CLINICAL IMPRESSION(S) / ED DIAGNOSES  Final diagnoses:  Generalized abdominal pain  Viral syndrome      Carrie Mew, MD 01/18/16 [phone]

## 2016-01-18 NOTE — Discharge Instructions (Signed)

## 2016-01-19 ENCOUNTER — Encounter: Payer: Self-pay | Admitting: Emergency Medicine

## 2016-01-19 ENCOUNTER — Emergency Department
Admission: EM | Admit: 2016-01-19 | Discharge: 2016-01-19 | Disposition: A | Discharge status: Home / Self Care | Payer: Medicaid Other | Attending: Emergency Medicine | Admitting: Emergency Medicine

## 2016-01-19 DIAGNOSIS — R1084 Generalized abdominal pain: Secondary | ICD-10-CM

## 2016-01-19 DIAGNOSIS — B349 Viral infection, unspecified: Secondary | ICD-10-CM

## 2016-01-19 MED ORDER — RANITIDINE HCL 150 MG/10ML PO SYRP: 150.0000 mg | ORAL_SOLUTION | Freq: Once | ORAL | Status: DC

## 2016-01-19 MED ORDER — GI COCKTAIL ~~LOC~~
30.0000 mL | ORAL | Status: AC
  Administered 2016-01-19: 30 mL via ORAL
  Filled 2016-01-19: qty 30

## 2016-01-19 MED ORDER — ONDANSETRON 8 MG PO TBDP
8.0000 mg | ORAL_TABLET | ORAL | Status: AC
  Administered 2016-01-19: 8 mg via ORAL
  Filled 2016-01-19: qty 1

## 2016-01-19 NOTE — ED Notes (Signed)
Pharmacy tech has had a difficult time getting anyone to answer the phone so that she can get a faxed MAR   - She is expecting the fax at any time - she will update and then I will order

## 2016-01-19 NOTE — ED Notes (Signed)
Per group home pt needs to wait where she can be watched - she left the group home yesterday and today without permission.

## 2016-01-19 NOTE — ED Notes (Signed)
Lunch provided  Pt observed with no unusual behavior  Appropriate to stimulation  No verbalized needs or concerns at this time  NAD assessed  Continue to monitor

## 2016-01-19 NOTE — ED Provider Notes (Signed)
-----------------------------------------   7:36 AM on 01/19/2016 -----------------------------------------   Blood pressure 220/114, pulse 94, temperature 98.7 F (37.1 C), temperature source Oral, resp. rate 18, height 5\' 2"  (1.575 m), weight 186 lb 8 oz (84.596 kg), SpO2 97 %.  The patient had no acute events since last update.  Calm and cooperative at this time.  Disposition is pending per Psychiatry/Behavioral Medicine/CSW team recommendations. Patient received dialysis yesterday and will be due to be dialyzed tomorrow, Friday 01/20/2016. This will be an issue for consideration should patient still be in the emergency department tomorrow.     Paulette Blanch, MD 01/19/16 [phone]

## 2016-01-19 NOTE — ED Notes (Signed)
ED B[facility] Is the patient under IVC or is there intent for IVC: Yes.   Is the patient medically cleared: yes.   Is there vacancy in the ED BHU: Yes.   Is the population mix appropriate for patient:  No    Is the patient awaiting placement in inpatient or outpatient setting: Yes.   Has the patient had a psychiatric consult:   Survey of unit performed for contraband, proper placement and condition of furniture, tampering with fixtures in bathroom, shower, and each patient room: Yes.  ; Findings:  APPEARANCE/BEHAVIOR Calm and cooperative NEURO ASSESSMENT Orientation: oriented x3  Denies pain Hallucinations: No.None noted (Hallucinations) Speech: Normal Gait: normal RESPIRATORY ASSESSMENT Even  Unlabored respirations  CARDIOVASCULAR ASSESSMENT Pulses equal   regular rate  Skin warm and dry   GASTROINTESTINAL ASSESSMENT no GI complaint EXTREMITIES Full ROM  PLAN OF CARE Provide calm/safe environment. Vital signs assessed twice daily. ED BHU Assessment once each 12-hour shift. Collaborate with intake RN daily or as condition indicates. Assure the ED provider has rounded once each shift. Provide and encourage hygiene. Provide redirection as needed. Assess for escalating behavior; address immediately and inform ED provider.  Assess family dynamic and appropriateness for visitation as needed: Yes.  ; If necessary, describe findings:  Educate the patient/family about BHU procedures/visitation: Yes.  ; If necessary, describe findings:

## 2016-01-19 NOTE — ED Notes (Signed)

## 2016-01-19 NOTE — Progress Notes (Signed)
LCSW spoke to Guardian and advised her that once pt  cleared she can return to group home   LCSW was advised to call group home [phone] ask for Izora Gala or Fli[facility] and they will pick her up. LCSW will consult with ED nurse.  Delbra Zellars LCSW

## 2016-01-19 NOTE — ED Notes (Addendum)
Discharge instructions reviewed with group home caregivers - they appeared not interested in the education I was trying to provide  They took the papers and left our facility with her

## 2016-01-19 NOTE — ED Notes (Signed)
Patient with complaint of nausea, vomiting, diarrhea and generalized cramping abd pain.

## 2016-01-19 NOTE — ED Notes (Signed)
Pt discharged a few hrs ago after ivc revoked by psychiatrist when determined at time not to be a harm to herself or others. Pt back from group home for n/v upset stomach after eating [employer] cheeseburger and some "skins". Pt advised not to eat greasy food while she is nauseated.

## 2016-01-19 NOTE — BH Assessment (Signed)
Assessment Note  Kelly [name] is an 35 y.o. female initially presenting to the ED for abdominal and headache pain.  Before being discharged from the ED, pt's group home had her IVCd.  According to the IVC, pt is mentally handicapped and can do little for herself. She leaves the residence by herself. The other residents and some of the staff are afraid of the patient. During her time in the ED, pt did not display any aggressive behavior.  Pt denied any SI or HI. Diagnosis: Evaluation  Past Medical History:  Past Medical History  Diagnosis Date  . Chronic kidney disease   . Hypertension   . Bipolar disorder ([place])   . Depression   . Depression   . Elevated lipids   . Cognitive changes   . Alcohol abuse     chronic  . Cognitive impairment   . Staph aureus infection     A/V fistula  . Diabetes mellitus without complication ([place])   . UTI (lower urinary tract infection)   . Hyperparathyroidism [facility])     Past Surgical History  Procedure Laterality Date  . Av fistula placement    . Cholecystectomy    . Dialysis perma cath Right     chest  . Vascular access device insertion Left 04/22/2015    Procedure: INSERTION OF HERO VASCULAR ACCESS DEVICE;  Surgeon: Katha Cabal, MD;  Location: [facility] ORS;  Service: Vascular;  Laterality: Left;  . Peripheral vascular catheterization N/A 05/24/2015    Procedure: Dialysis/Perma Catheter Removal;  Surgeon: Katha Cabal, MD;  Location: [place] [facility];  Service: Cardiovascular;  Laterality: N/A;    Family History: History reviewed. No pertinent family history.  Social History:  reports that she has been smoking Cigarettes.  She has been smoking about 1.00 pack per day for the past 0 years. She uses smokeless tobacco. She reports that she uses illicit drugs. She reports that she does not drink alcohol.  Additional Social History:  Alcohol / Drug Use History of alcohol / drug use?: No history of alcohol / drug abuse  CIWA: CIWA-Ar BP:  (!) 180/103 mmHg Pulse Rate: 96 COWS:    Allergies: No Known Allergies  Home Medications:  (Not in a hospital admission)  OB/GYN Status:  No LMP recorded. Patient has had an injection.  General Assessment Data Location of Assessment: [facility] ED TTS Assessment: In system Is this a Tele or Face-to-Face Assessment?: Face-to-Face Is this an Initial Assessment or a Re-assessment for this encounter?: Initial Assessment Marital status: Single Maiden name: N/A Is patient pregnant?: No Pregnancy Status: No Living Arrangements: Group Home Can pt return to current living arrangement?: Yes Admission Status: Involuntary Is patient capable of signing voluntary admission?: No Referral Source: Other Insurance type: Medicaid     Crisis Care Plan Living Arrangements: Group Home Legal Guardian: Other: [name] [phone]) Name of Psychiatrist: N/A Name of Therapist: N/A  Education Status Is patient currently in school?: No Current Grade: N/A Highest grade of school patient has completed: N/A Name of school: N/A Contact person: N/A  Risk to self with the past 6 months Suicidal Ideation: No Has patient been a risk to self within the past 6 months prior to admission? : No Suicidal Intent: No Has patient had any suicidal intent within the past 6 months prior to admission? : No Is patient at risk for suicide?: No Suicidal Plan?: No Has patient had any suicidal plan within the past 6 months prior to admission? : No  Access to Means: No What has been your use of drugs/alcohol within the last 12 months?: None identified Previous Attempts/Gestures: No How many times?: 0 Other Self Harm Risks: None identified Triggers for Past Attempts: None known Intentional Self Injurious Behavior: None Family Suicide History: Unknown Recent stressful life event(s): Other (Comment) Persecutory voices/beliefs?: No Depression: No Substance abuse history and/or treatment for substance abuse?:  No Suicide prevention information given to non-admitted patients: Not applicable  Risk to Others within the past 6 months Homicidal Ideation: No Does patient have any lifetime risk of violence toward others beyond the six months prior to admission? : No Thoughts of Harm to Others: No Current Homicidal Intent: No Current Homicidal Plan: No Access to Homicidal Means: No Identified Victim: None identified History of harm to others?: No Assessment of Violence: None Noted Violent Behavior Description: None identified Does patient have access to weapons?: No Criminal Charges Pending?: No Does patient have a court date: No Is patient on probation?: No  Psychosis Hallucinations: None noted Delusions: None noted  Mental Status Report Appearance/Hygiene: Unremarkable Eye Contact: Good Motor Activity: Freedom of movement Speech: Logical/coherent Level of Consciousness: Alert Mood: Pleasant Affect: Appropriate to circumstance Anxiety Level: None Thought Processes: Coherent, Relevant Judgement: Unimpaired Orientation: Person, Place, Time, Situation Obsessive Compulsive Thoughts/Behaviors: None  Cognitive Functioning Concentration: Normal Memory: Recent Intact, Remote Intact IQ: Below Average Insight: Fair Impulse Control: Fair Appetite: Good Weight Loss: 0 Weight Gain: 0 Sleep: No Change Total Hours of Sleep: 8 Vegetative Symptoms: None  ADLScreening [facility] Assessment Services) Patient's cognitive ability adequate to safely complete daily activities?: Yes Patient able to express need for assistance with ADLs?: Yes Independently performs ADLs?: Yes (appropriate for developmental age)  Prior Inpatient Therapy Prior Inpatient Therapy: No Prior Therapy Dates: N/A Prior Therapy Facilty/Provider(s): N/A Reason for Treatment: N/A  Prior Outpatient Therapy Prior Outpatient Therapy: No Prior Therapy Dates: N/A Prior Therapy Facilty/Provider(s): N/A Reason for Treatment:  N/A Does patient have an ACCT team?: No Does patient have Intensive In-House Services?  : No Does patient have [facility] services? : No Does patient have P4CC services?: No  ADL Screening (condition at time of admission) Patient's cognitive ability adequate to safely complete daily activities?: Yes Patient able to express need for assistance with ADLs?: Yes Independently performs ADLs?: Yes (appropriate for developmental age)       Abuse/Neglect Assessment (Assessment to be complete while patient is alone) Physical Abuse: Denies Verbal Abuse: Denies Sexual Abuse: Denies Exploitation of patient/patient's resources: Denies Self-Neglect: Denies Values / Beliefs Cultural Requests During Hospitalization: None Spiritual Requests During Hospitalization: None Consults Spiritual Care Consult Needed: No Social Work Consult Needed: Yes (Comment) Advance Directives (For Healthcare) Does patient have an advance directive?: No Would patient like information on creating an advanced directive?: No - patient declined information    Additional Information 1:1 In Past 12 Months?: No CIRT Risk: No Elopement Risk: No Does patient have medical clearance?: Yes     Disposition:  Disposition Initial Assessment Completed for this Encounter: Yes Disposition of Patient: Other dispositions Other disposition(s): Other (Comment) (Psych MD consult)  On Site Evaluation by:   Reviewed with Physician:    Oneita Hurt 01/19/2016 3:09 AM

## 2016-01-19 NOTE — Progress Notes (Signed)
LCSW called Group Home and left a message for [name] [phone]  Guardian called Ms Marcello Moores [phone] called and left message, awaiting call back  [place] LCSW [phone]

## 2016-01-19 NOTE — ED Provider Notes (Signed)
[facility] Emergency Department Provider Note  ____________________________________________  Time seen: 9:00 PM  I have reviewed the triage vital signs and the nursing notes.   HISTORY  Chief Complaint Nausea; Emesis; and Diarrhea    HPI Kelly [name] is a 35 y.o. female comes to the ED today complaining of generalized abdominal pain with nausea vomiting and diarrhea for the past 3 days. She states 1 other person in her group home has had similar symptoms. She also complains of body aches all over as well as a nonproductive cough. Denies chest pain or shortness of breath. She was seen in the emergency department yesterday for similar symptoms. His stricture home with prescriptions for Zofran and Zantac. She has been eating today, mainly Doritos. She request cheese and crackers at this time.  Monday Wednesday Friday dialysis. Compliant with sessions, completed dialysis yesterday.   Past Medical History  Diagnosis Date  . Chronic kidney disease   . Hypertension   . Bipolar disorder ([place])   . Depression   . Depression   . Elevated lipids   . Cognitive changes   . Alcohol abuse     chronic  . Cognitive impairment   . Staph aureus infection     A/V fistula  . Diabetes mellitus without complication ([place])   . UTI (lower urinary tract infection)   . Hyperparathyroidism [facility])      Patient Active Problem List   Diagnosis Date Noted  . Diabetes ([place]) 04/23/2015  . Essential hypertension 04/23/2015  . ESRD on dialysis ([place]) 04/23/2015  . Pain of left arm 04/22/2015     Past Surgical History  Procedure Laterality Date  . Av fistula placement    . Cholecystectomy    . Dialysis perma cath Right     chest  . Vascular access device insertion Left 04/22/2015    Procedure: INSERTION OF HERO VASCULAR ACCESS DEVICE;  Surgeon: Katha Cabal, MD;  Location: [facility] ORS;  Service: Vascular;  Laterality: Left;  . Peripheral vascular catheterization N/A  05/24/2015    Procedure: Dialysis/Perma Catheter Removal;  Surgeon: Katha Cabal, MD;  Location: [place] [facility];  Service: Cardiovascular;  Laterality: N/A;     Current Outpatient Rx  Name  Route  Sig  Dispense  Refill  . amLODipine (NORVASC) 10 MG tablet   Oral   Take 10 mg by mouth at bedtime. *hold for systolic blood pressure [id number], call MD if holding this medication.*         . aspirin 81 MG chewable tablet   Oral   Chew 81 mg by mouth daily.         . B Complex-C-Folic Acid (RENA-VITE RX PO)   Oral   Take 1 tablet by mouth daily.         . calcium acetate (PHOSLO) 667 MG capsule   Oral   Take 1,334 mg by mouth 2 (two) times daily with a meal.          . cinacalcet (SENSIPAR) 60 MG tablet   Oral   Take 60 mg by mouth daily.         . cloNIDine (CATAPRES) 0.2 MG tablet   Oral   Take 0.2 mg by mouth 2 (two) times daily.         . divalproex (DEPAKOTE) 500 MG DR tablet   Oral   Take 1,000 mg by mouth at bedtime.          . docusate sodium (COLACE)  100 MG capsule   Oral   Take 100 mg by mouth at bedtime.          Marland Kitchen escitalopram (LEXAPRO) 10 MG tablet   Oral   Take 15 mg by mouth daily.         . folic acid (FOLVITE) 1 MG tablet   Oral   Take 2 mg by mouth at bedtime.          Marland Kitchen glucagon (GLUCAGEN HYPOKIT) 1 MG SOLR injection   Intramuscular   Inject 1 mg into the muscle once as needed for low blood sugar. For emergency use blood glucose <75 and not responsive.         . hydrALAZINE (APRESOLINE) 50 MG tablet   Oral   Take 50 mg by mouth 3 (three) times daily.         . insulin aspart (NOVOLOG) 100 UNIT/ML injection   Subcutaneous   Inject 0-12 Units into the skin 3 (three) times daily before meals. Per sliding scale         . insulin glargine (LANTUS) 100 unit/mL SOPN   Subcutaneous   Inject 15 Units into the skin at bedtime.         . lidocaine-prilocaine (EMLA) cream   Topical   Apply 1 application topically 3  (three) times a week. Apply to access site 30 minutes before dialysis.         Marland Kitchen medroxyPROGESTERone (DEPO-PROVERA) 150 MG/ML injection   Intramuscular   Inject 150 mg into the muscle every 3 (three) months.         . metoprolol (LOPRESSOR) 50 MG tablet   Oral   Take 50 mg by mouth 2 (two) times daily.         . Multiple Vitamin (THEREMS PO)   Oral   Take 1 tablet by mouth daily.         . ondansetron (ZOFRAN ODT) 4 MG disintegrating tablet   Oral   Take 1 tablet (4 mg total) by mouth every 8 (eight) hours as needed for nausea or vomiting.   20 tablet   0   . polyethylene glycol (MIRALAX / GLYCOLAX) packet   Oral   Take 17 g by mouth daily as needed for mild constipation, moderate constipation or severe constipation. Mix with 8 oz. of juice.         Marland Kitchen QUEtiapine (SEROQUEL) 200 MG tablet   Oral   Take 200 mg by mouth 2 (two) times daily.          . ranitidine (ZANTAC) 150 MG capsule   Oral   Take 1 capsule (150 mg total) by mouth 2 (two) times daily.   28 capsule   0   . traZODone (DESYREL) 100 MG tablet   Oral   Take 100 mg by mouth at bedtime.         . vitamin B-12 (CYANOCOBALAMIN) 1000 MCG tablet   Oral   Take 1,000 mcg by mouth at bedtime.             Allergies Review of patient's allergies indicates no known allergies.   No family history on file.  Social History Social History  Substance Use Topics  . Smoking status: Current Every Day Smoker -- 0.50 packs/day for 0 years    Types: Cigarettes  . Smokeless tobacco: Current User  . Alcohol Use: No    Review of Systems  Constitutional:   No fever or chills. No weight changes Eyes:  No blurry vision or double vision.  ENT:   No sore throat.  Cardiovascular:   No chest pain. Respiratory:   No dyspnea positive nonproductive cough. Gastrointestinal:   Positive for abdominal pain, vomiting and diarrhea.  No BRBPR or melena. Genitourinary:   Negative for dysuria or difficulty  urinating. Musculoskeletal:   Negative for back pain. Positive diffuse myalgia  Skin:   Negative for rash. Neurological:   Negative for headaches, focal weakness or numbness. Psychiatric:  No anxiety or depression.   Endocrine:  No changes in energy or sleep difficulty.  10-point ROS otherwise negative.  ____________________________________________   PHYSICAL EXAM:  VITAL SIGNS: ED Triage Vitals  Enc Vitals Group     BP 01/19/16 1918 193/113 mmHg     Pulse Rate 01/19/16 1918 107     Resp 01/19/16 1918 20     Temp 01/19/16 1918 98.2 F (36.8 C)     Temp Source 01/19/16 1918 Oral     SpO2 01/19/16 1918 100 %     Weight 01/19/16 1918 197 lb (89.359 kg)     Height --      Head Cir --      Peak Flow --      Pain Score 01/19/16 1918 10     Pain Loc --      Pain Edu? --      Excl. in [place]? --     Vital signs reviewed, nursing assessments reviewed.   Constitutional:   Alert and oriented. Well appearing and in no distress. Eyes:   No scleral icterus. No conjunctival pallor. PERRL. EOMI ENT   Head:   Normocephalic and atraumatic.   Nose:   No congestion/rhinnorhea. No septal hematoma   Mouth/Throat:   MMM, no pharyngeal erythema. No peritonsillar mass.    Neck:   No stridor. No SubQ emphysema. No meningismus. Hematological/Lymphatic/Immunilogical:   No cervical lymphadenopathy. Cardiovascular:   RRR. Symmetric bilateral radial and DP pulses.  No murmurs.  Respiratory:   Normal respiratory effort without tachypnea nor retractions. Breath sounds are clear and equal bilaterally. No wheezes/rales/rhonchi. Gastrointestinal:   Soft with mild generalized tenderness. Non distended. There is no CVA tenderness.  No rebound, rigidity, or guarding. Genitourinary:   deferred Musculoskeletal:   Nontender with normal range of motion in all extremities. No joint effusions.  No lower extremity tenderness.  No edema. Neurologic:   Normal speech and language.  CN 2-10  normal. Motor grossly intact. No gross focal neurologic deficits are appreciated.  Skin:    Skin is warm, dry and intact. No rash noted.  No petechiae, purpura, or bullae. Psychiatric:   Bizarre affect. Mood normal ____________________________________________    LABS (pertinent positives/negatives) (all labs ordered are listed, but only abnormal results are displayed) Labs Reviewed - No data to display ____________________________________________   EKG    ____________________________________________    RADIOLOGY    ____________________________________________   PROCEDURES   ____________________________________________   INITIAL IMPRESSION / ASSESSMENT AND PLAN / ED COURSE  Pertinent labs & imaging results that were available during my care of the patient were reviewed by me and considered in my medical decision making (see chart for details).  Patient well appearing no acute distress. Symptoms consistent with viral syndrome.Considering the patient's symptoms, medical history, and physical examination today, I have low suspicion for cholecystitis or biliary pathology, pancreatitis, perforation or bowel obstruction, hernia, intra-abdominal abscess, AAA or dissection, volvulus or intussusception, mesenteric ischemia, or appendicitis.  Continue Zantac and Zofran for symptom relief, continue  hydration. Counseled on expected clinical course of viral syndrome. Follow up with primary care. She is very eager to eat her [employer] cheeseburger that she brought with her. I will discharge back to the group home.     ____________________________________________   FINAL CLINICAL IMPRESSION(S) / ED DIAGNOSES  Final diagnoses:  Viral syndrome  Generalized abdominal pain      Carrie Mew, MD 01/19/16 2122

## 2016-01-19 NOTE — ED Notes (Signed)
Received a call from a female employee from the group home  He stated  "I am the only person here and I cannot come get her until 3-4 this afternoon."  I informed CSW - CSW called group home back about delaying the discharge of the client that they are supposed to be taking care of

## 2016-01-19 NOTE — ED Notes (Signed)
Pt discharged to group home, paperwork to worker

## 2016-01-19 NOTE — ED Notes (Signed)
BEHAVIORAL HEALTH ROUNDING Patient sleeping: No. Patient alert and oriented: yes Behavior appropriate: Yes.  ; If no, describe:  Nutrition and fluids offered: yes Toileting and hygiene offered: Yes  Sitter present: q15 minute observations and security  monitoring Law enforcement present: Yes  ODS  

## 2016-01-19 NOTE — Progress Notes (Signed)
IN consultation with RN who spoke to group home provider patient will be picked up at 4pm, LCSW called Guardian to express not acceptable, she will attempt to reach them.  [employer] LCSW [phone]

## 2016-01-19 NOTE — Discharge Instructions (Signed)

## 2016-01-19 NOTE — ED Notes (Signed)
She continues to ambulate in the hallway  - breakfast provided late due to no order present at change of shift   Tray provided  Assessment completed  No am meds ordered at this time - [facility] requests that when meds are updated I will order them

## 2016-01-19 NOTE — ED Notes (Signed)
Pt observed standing in the middle of the hallway - stretchers and foot traffic cannot get past her - I have educated her on the importance of sitting on her hallway bed and I provided her with a chair to sit in if she would like that better

## 2016-01-23 ENCOUNTER — Encounter: Payer: Self-pay | Admitting: Emergency Medicine

## 2016-01-23 ENCOUNTER — Emergency Department
Admission: EM | Admit: 2016-01-23 | Discharge: 2016-01-25 | Disposition: A | Discharge status: Other Acute Inpatient Hospital | Payer: MEDICAID | Attending: Student | Admitting: Student

## 2016-01-23 DIAGNOSIS — F0280 Dementia in other diseases classified elsewhere without behavioral disturbance: Secondary | ICD-10-CM

## 2016-01-23 DIAGNOSIS — R45851 Suicidal ideations: Secondary | ICD-10-CM | POA: Diagnosis present

## 2016-01-23 DIAGNOSIS — Z794 Long term (current) use of insulin: Secondary | ICD-10-CM | POA: Insufficient documentation

## 2016-01-23 DIAGNOSIS — R109 Unspecified abdominal pain: Secondary | ICD-10-CM | POA: Insufficient documentation

## 2016-01-23 DIAGNOSIS — I120 Hypertensive chronic kidney disease with stage 5 chronic kidney disease or end stage renal disease: Secondary | ICD-10-CM | POA: Diagnosis not present

## 2016-01-23 DIAGNOSIS — F17210 Nicotine dependence, cigarettes, uncomplicated: Secondary | ICD-10-CM | POA: Insufficient documentation

## 2016-01-23 DIAGNOSIS — Z79899 Other long term (current) drug therapy: Secondary | ICD-10-CM | POA: Diagnosis not present

## 2016-01-23 DIAGNOSIS — I10 Essential (primary) hypertension: Secondary | ICD-10-CM | POA: Diagnosis present

## 2016-01-23 DIAGNOSIS — Z7982 Long term (current) use of aspirin: Secondary | ICD-10-CM | POA: Diagnosis not present

## 2016-01-23 DIAGNOSIS — Z992 Dependence on renal dialysis: Secondary | ICD-10-CM

## 2016-01-23 DIAGNOSIS — F79 Unspecified intellectual disabilities: Secondary | ICD-10-CM

## 2016-01-23 DIAGNOSIS — E119 Type 2 diabetes mellitus without complications: Secondary | ICD-10-CM | POA: Insufficient documentation

## 2016-01-23 DIAGNOSIS — N186 End stage renal disease: Secondary | ICD-10-CM | POA: Insufficient documentation

## 2016-01-23 DIAGNOSIS — R112 Nausea with vomiting, unspecified: Secondary | ICD-10-CM | POA: Insufficient documentation

## 2016-01-23 DIAGNOSIS — G3184 Mild cognitive impairment, so stated: Secondary | ICD-10-CM | POA: Diagnosis not present

## 2016-01-23 LAB — CBC
HCT: 34.5 % — ABNORMAL LOW (ref 35.0–47.0)
Hemoglobin: 11.1 g/dL — ABNORMAL LOW (ref 12.0–16.0)
MCH: 31.5 pg (ref 26.0–34.0)
MCHC: 32.2 g/dL (ref 32.0–36.0)
MCV: 97.6 fL (ref 80.0–100.0)
PLATELETS: 167 10*3/uL (ref 150–440)
RBC: 3.53 MIL/uL — AB (ref 3.80–5.20)
RDW: 15.7 % — ABNORMAL HIGH (ref 11.5–14.5)
WBC: 5.3 10*3/uL (ref 3.6–11.0)

## 2016-01-23 LAB — COMPREHENSIVE METABOLIC PANEL
ALT: 14 U/L (ref 14–54)
ANION GAP: 11 (ref 5–15)
AST: 19 U/L (ref 15–41)
Albumin: 4.3 g/dL (ref 3.5–5.0)
Alkaline Phosphatase: 79 U/L (ref 38–126)
BUN: 21 mg/dL — ABNORMAL HIGH (ref 6–20)
CHLORIDE: 98 mmol/L — AB (ref 101–111)
CO2: 31 mmol/L (ref 22–32)
CREATININE: 5.98 mg/dL — AB (ref 0.44–1.00)
Calcium: 9.4 mg/dL (ref 8.9–10.3)
GFR, EST AFRICAN AMERICAN: 10 mL/min — AB (ref >60)
GFR, EST NON AFRICAN AMERICAN: 8 mL/min — AB (ref >60)
Glucose, Bld: 330 mg/dL — ABNORMAL HIGH (ref 65–99)
POTASSIUM: 3.7 mmol/L (ref 3.5–5.1)
SODIUM: 140 mmol/L (ref 135–145)
Total Bilirubin: 0.1 mg/dL — ABNORMAL LOW (ref 0.3–1.2)
Total Protein: 7.4 g/dL (ref 6.5–8.1)

## 2016-01-23 LAB — ACETAMINOPHEN LEVEL

## 2016-01-23 LAB — GLUCOSE, CAPILLARY: GLUCOSE-CAPILLARY: 245 mg/dL — AB (ref 65–99)

## 2016-01-23 LAB — SALICYLATE LEVEL

## 2016-01-23 LAB — ETHANOL

## 2016-01-23 MED ORDER — ADULT MULTIVITAMIN W/MINERALS CH
ORAL_TABLET | Freq: Every day | ORAL | Status: DC
  Administered 2016-01-24 – 2016-01-25 (×2): 1 via ORAL
  Filled 2016-01-23 (×2): qty 1

## 2016-01-23 MED ORDER — INSULIN GLARGINE 100 UNIT/ML ~~LOC~~ SOLN
15.0000 [IU] | Freq: Every day | SUBCUTANEOUS | Status: DC
  Administered 2016-01-24 (×2): 15 [IU] via SUBCUTANEOUS
  Filled 2016-01-23 (×4): qty 0.15

## 2016-01-23 MED ORDER — ONDANSETRON 8 MG PO TBDP
4.0000 mg | ORAL_TABLET | Freq: Three times a day (TID) | ORAL | Status: DC | PRN
  Filled 2016-01-23: qty 0.5

## 2016-01-23 MED ORDER — FOLIC ACID 1 MG PO TABS
2.0000 mg | ORAL_TABLET | Freq: Every day | ORAL | Status: DC
  Administered 2016-01-23 – 2016-01-24 (×2): 2 mg via ORAL
  Filled 2016-01-23 (×2): qty 2

## 2016-01-23 MED ORDER — HYDRALAZINE HCL 50 MG PO TABS
50.0000 mg | ORAL_TABLET | Freq: Three times a day (TID) | ORAL | Status: DC
  Administered 2016-01-23 – 2016-01-25 (×6): 50 mg via ORAL
  Filled 2016-01-23 (×5): qty 1
  Filled 2016-01-23: qty 2
  Filled 2016-01-23 (×4): qty 1

## 2016-01-23 MED ORDER — INSULIN ASPART 100 UNIT/ML ~~LOC~~ SOLN
0.0000 [IU] | Freq: Three times a day (TID) | SUBCUTANEOUS | Status: DC
  Administered 2016-01-24: 2 [IU] via SUBCUTANEOUS
  Administered 2016-01-25: 5 [IU] via SUBCUTANEOUS
  Filled 2016-01-23: qty 0.15

## 2016-01-23 MED ORDER — AMLODIPINE BESYLATE 5 MG PO TABS
10.0000 mg | ORAL_TABLET | Freq: Every day | ORAL | Status: DC
  Administered 2016-01-23 – 2016-01-24 (×2): 10 mg via ORAL
  Filled 2016-01-23 (×2): qty 2

## 2016-01-23 MED ORDER — NEPHRO-VITE 0.8 MG PO TABS
ORAL_TABLET | Freq: Every day | ORAL | Status: DC
  Administered 2016-01-24 – 2016-01-25 (×2): via ORAL
  Filled 2016-01-23 (×3): qty 1

## 2016-01-23 MED ORDER — ESCITALOPRAM OXALATE 10 MG PO TABS
15.0000 mg | ORAL_TABLET | Freq: Every day | ORAL | Status: DC
  Administered 2016-01-24 – 2016-01-25 (×2): 15 mg via ORAL
  Filled 2016-01-23 (×3): qty 2

## 2016-01-23 MED ORDER — ASPIRIN 81 MG PO CHEW
81.0000 mg | CHEWABLE_TABLET | Freq: Every day | ORAL | Status: DC
  Administered 2016-01-24 – 2016-01-25 (×2): 81 mg via ORAL
  Filled 2016-01-23 (×4): qty 1

## 2016-01-23 MED ORDER — DOCUSATE SODIUM 100 MG PO CAPS
100.0000 mg | ORAL_CAPSULE | Freq: Every day | ORAL | Status: DC
  Administered 2016-01-23 – 2016-01-24 (×2): 100 mg via ORAL
  Filled 2016-01-23 (×2): qty 1

## 2016-01-23 MED ORDER — CLONIDINE HCL 0.1 MG PO TABS
0.2000 mg | ORAL_TABLET | Freq: Two times a day (BID) | ORAL | Status: DC
  Administered 2016-01-23 – 2016-01-25 (×4): 0.2 mg via ORAL
  Filled 2016-01-23 (×4): qty 2

## 2016-01-23 MED ORDER — CALCIUM ACETATE 667 MG PO CAPS
1334.0000 mg | ORAL_CAPSULE | Freq: Two times a day (BID) | ORAL | Status: DC
  Administered 2016-01-24 – 2016-01-25 (×4): 1334 mg via ORAL
  Filled 2016-01-23 (×6): qty 2

## 2016-01-23 MED ORDER — GLUCAGON HCL (RDNA) 1 MG IJ SOLR: 1.0000 mg | Freq: Once | INTRAMUSCULAR | Status: DC | PRN

## 2016-01-23 MED ORDER — METOPROLOL TARTRATE 25 MG PO TABS
50.0000 mg | ORAL_TABLET | Freq: Two times a day (BID) | ORAL | Status: DC
  Administered 2016-01-23 – 2016-01-25 (×4): 50 mg via ORAL
  Filled 2016-01-23: qty 1
  Filled 2016-01-23 (×3): qty 2

## 2016-01-23 MED ORDER — DIVALPROEX SODIUM 500 MG PO DR TAB
1000.0000 mg | DELAYED_RELEASE_TABLET | Freq: Every day | ORAL | Status: DC
  Administered 2016-01-23 – 2016-01-24 (×2): 1000 mg via ORAL
  Filled 2016-01-23 (×2): qty 2

## 2016-01-23 MED ORDER — CINACALCET HCL 30 MG PO TABS
60.0000 mg | ORAL_TABLET | Freq: Every day | ORAL | Status: DC
  Administered 2016-01-25: 60 mg via ORAL
  Filled 2016-01-23 (×3): qty 2

## 2016-01-23 MED ORDER — INSULIN ASPART 100 UNIT/ML ~~LOC~~ SOLN: 0.0000 [IU] | Freq: Three times a day (TID) | SUBCUTANEOUS | Status: DC

## 2016-01-23 MED ORDER — FAMOTIDINE 20 MG PO TABS
20.0000 mg | ORAL_TABLET | Freq: Two times a day (BID) | ORAL | Status: DC
  Administered 2016-01-23 – 2016-01-25 (×4): 20 mg via ORAL
  Filled 2016-01-23 (×4): qty 1

## 2016-01-23 MED ORDER — POLYETHYLENE GLYCOL 3350 17 G PO PACK: 17.0000 g | PACK | Freq: Every day | ORAL | Status: DC | PRN

## 2016-01-23 MED ORDER — QUETIAPINE FUMARATE 200 MG PO TABS
200.0000 mg | ORAL_TABLET | Freq: Two times a day (BID) | ORAL | Status: DC
  Administered 2016-01-23 – 2016-01-25 (×4): 200 mg via ORAL
  Filled 2016-01-23 (×4): qty 1

## 2016-01-23 NOTE — ED Notes (Signed)
Intake at bedside.

## 2016-01-23 NOTE — ED Notes (Signed)
Spoke with [facility] at [facility], unable to fax [facility]. Attempt X 2 unsuccessful. Verbally reviewed medications with her, states that all medications that were filed 01/19/16 are current. Will call [facility] in AM.

## 2016-01-23 NOTE — ED Notes (Signed)
Patient presents to the ED with [facility] [employer].  Officer states patient came up to him on [address] and told him that she had gotten kicked out of her group home ,([facility] [address] and that she was having thoughts of hurting herself.  This RN called Group home.  They stated that patient walked out of the group home.  They stated that patient often leaves the group home and they do not have the ability to keep her there.  Patient had dialysis this morning.  Patient states her plan is to cut herself with a knife.  Patient denies having access to a knife.

## 2016-01-23 NOTE — ED Notes (Addendum)
[facility] home staff called and given fax number to send [facility] over, states that they will send now.

## 2016-01-23 NOTE — ED Notes (Signed)
Called Pharmacy to send up Sensipar and Lantus

## 2016-01-23 NOTE — ED Notes (Signed)

## 2016-01-23 NOTE — BH Assessment (Addendum)
Assessment Note  Kelly [name] is an 35 y.o. female. Kelly [name] arrived to the ED by way of police.  She state she is not feeling good.  She states her head and her feet her. She reports that she is on dialysis. She states that she is feeling depressed She says that she has not been sleeping good. She states that when she is depressed "I be wanting to kill myself". She states that she was trying to cut her wrists. No visible scars were on her person where she indicated she attempted to cut herself. She reports a history of auditory and visual hallucinations, but denied seeing or hearing anything at this time.  She denied wanting to harm others.  She denied the use of alcohol or drugs.  She reports that she resides in a group home "But I ran away".  She states the name of the group home as [facility]".   She currently has a guardian named Ms. Sharol Roussel.  She reports that the staff is cussing at her and grabbing at her. She states she has been at her current group home for about 1 year. TTS spoke with [facility] Staff) regarding Kelly [name].  She reported that Kelly [name] has been walking off from the group home for the past several days.  She states that Kelly [name] does not want to live at the facility anymore.  She further shared that Kelly [name] does not listen to the staff and has been threatening the staff lately.  She is reportedly a diabetic and becomes upset when she cannot get the things she wants to eat.  She is reported as wandering off and tell others that she is homeless and ask for things.  Kelly [name] was recently assigned a new guardian, who is presumably seeking a new placement for her.  Group home staff shared that Kelly [name] may return to the facility upon discharge.   Diagnosis: Depression  Past Medical History:  Past Medical History  Diagnosis Date  . Chronic kidney disease   . Hypertension   . Bipolar disorder ([place])   . Depression   . Depression   . Elevated lipids   . Cognitive changes   .  Alcohol abuse     chronic  . Cognitive impairment   . Staph aureus infection     A/V fistula  . Diabetes mellitus without complication ([place])   . UTI (lower urinary tract infection)   . Hyperparathyroidism [facility])     Past Surgical History  Procedure Laterality Date  . Av fistula placement    . Cholecystectomy    . Dialysis perma cath Right     chest  . Vascular access device insertion Left 04/22/2015    Procedure: INSERTION OF HERO VASCULAR ACCESS DEVICE;  Surgeon: Katha Cabal, MD;  Location: [facility] ORS;  Service: Vascular;  Laterality: Left;  . Peripheral vascular catheterization N/A 05/24/2015    Procedure: Dialysis/Perma Catheter Removal;  Surgeon: Katha Cabal, MD;  Location: [place] [facility];  Service: Cardiovascular;  Laterality: N/A;    Family History: No family history on file.  Social History:  reports that she has been smoking Cigarettes.  She has been smoking about 0.25 packs per day for the past 0 years. She uses smokeless tobacco. She reports that she does not drink alcohol or use illicit drugs.  Additional Social History:     CIWA: CIWA-Ar BP: (!) 198/92 mmHg Pulse Rate: 82 COWS:    Allergies:  No Known Allergies  Home Medications:  (Not in a hospital admission)  OB/GYN Status:  No LMP recorded. Patient has had an injection.  General Assessment Data Location of Assessment: [facility] ED TTS Assessment: In system Is this a Tele or Face-to-Face Assessment?: Face-to-Face Is this an Initial Assessment or a Re-assessment for this encounter?: Initial Assessment Marital status: Single Maiden name: n/a Is patient pregnant?: No Pregnancy Status: No Living Arrangements: Group Home ([facility] - [phone]) Can pt return to current living arrangement?: Yes Admission Status: Voluntary Is patient capable of signing voluntary admission?: No Referral Source: Self/Family/Friend Insurance type: Medicaid  Medical Screening Exam ([place]) Medical Exam  completed: Yes  Crisis Care Plan Living Arrangements: Group Home ([facility] - [phone]) Legal Guardian: Other: (Ms Robina Ade [phone]) Name of Psychiatrist:  (Denied) Name of Therapist: Denied  Education Status Is patient currently in school?: No Current Grade: n/a Highest grade of school patient has completed: 10th Name of school: [employer] person: n/a  Risk to self with the past 6 months Suicidal Ideation: Yes-Currently Present Has patient been a risk to self within the past 6 months prior to admission? : No Suicidal Intent: No-Not Currently/Within Last 6 Months Has patient had any suicidal intent within the past 6 months prior to admission? : No Is patient at risk for suicide?: No Suicidal Plan?: Yes-Currently Present Has patient had any suicidal plan within the past 6 months prior to admission? : Yes Specify Current Suicidal Plan: Cut wrists Access to Means: No What has been your use of drugs/alcohol within the last 12 months?: denied use of drugs and alcohol Previous Attempts/Gestures: Yes How many times?: 4 (states more than 4 times ) Other Self Harm Risks: denied Triggers for Past Attempts: None known Intentional Self Injurious Behavior: None Family Suicide History: Unknown Recent stressful life event(s):  (Unhappy with living in a group home) Persecutory voices/beliefs?: No Depression: Yes Depression Symptoms: Feeling angry/irritable Substance abuse history and/or treatment for substance abuse?: No  Risk to Others within the past 6 months Homicidal Ideation: No Does patient have any lifetime risk of violence toward others beyond the six months prior to admission? : No Thoughts of Harm to Others: No Current Homicidal Intent: No Current Homicidal Plan: No Access to Homicidal Means: No Identified Victim: denied History of harm to others?: No Assessment of Violence: None Noted Violent Behavior Description: may be verbally threatening Does patient have  access to weapons?: No Criminal Charges Pending?: No Does patient have a court date: No Is patient on probation?: No  Psychosis Hallucinations: None noted Delusions: None noted  Mental Status Report Appearance/Hygiene: Unremarkable, In scrubs Eye Contact: Good Motor Activity: Unremarkable Speech: Logical/coherent Level of Consciousness: Alert Mood: Irritable Affect: Appropriate to circumstance Anxiety Level: None Thought Processes: Coherent Judgement: Impaired Orientation: Person, Place, Appropriate for developmental age Obsessive Compulsive Thoughts/Behaviors: None  Cognitive Functioning Concentration: Normal Memory: Recent Intact IQ: Below Average Insight: Fair Impulse Control: Fair Appetite: Good Sleep: Decreased Vegetative Symptoms: None  ADLScreening [facility] Assessment Services) Patient's cognitive ability adequate to safely complete daily activities?: Yes Patient able to express need for assistance with ADLs?: Yes Independently performs ADLs?: Yes (appropriate for developmental age)  Prior Inpatient Therapy Prior Inpatient Therapy: No Prior Therapy Dates: n/a Prior Therapy Facilty/Provider(s): n/a Reason for Treatment: n/a  Prior Outpatient Therapy Prior Outpatient Therapy: No Prior Therapy Dates: n/a Prior Therapy Facilty/Provider(s): n/a Reason for Treatment: n/a Does patient have an ACCT team?: No Does patient have Intensive In-House Services?  :  No Does patient have [facility] services? : No Does patient have P4CC services?: No  ADL Screening (condition at time of admission) Patient's cognitive ability adequate to safely complete daily activities?: Yes Patient able to express need for assistance with ADLs?: Yes Independently performs ADLs?: Yes (appropriate for developmental age)             Advance Directives (For Healthcare) Does patient have an advance directive?: No Would patient like information on creating an advanced directive?: No -  patient declined information    Additional Information 1:1 In Past 12 Months?: No CIRT Risk: No Elopement Risk: No Does patient have medical clearance?: Yes     Disposition:  Disposition Initial Assessment Completed for this Encounter: Yes Disposition of Patient: Other dispositions  On Site Evaluation by:   Reviewed with Physician:    Elmer Bales 01/23/2016 8:39 PM

## 2016-01-23 NOTE — ED Provider Notes (Signed)
[facility] Emergency Department Provider Note  ____________________________________________  Time seen: Approximately 6:53 PM  I have reviewed the triage vital signs and the nursing notes.   HISTORY  Chief Complaint Suicidal  Caveat-history of present illness and review of systems limited slightly due to the patient's cognitive impairment.  HPI Kerstie Sonner is a 35 y.o. female with diabetes, bipolar disorder, intellectual disability, end stage renal disease on dialysis (last dialyzed today), hypertension, who presents for evaluation of suicidal ideation with plan to cut herself with a knife, gradual onset today, causes onset, currently severe. The patient reports that she had a fight with someone at her group home, after which she ran away. She went up to a police officer and told him that she was having suicidal ideation and he brought her to the emergency department. She reports that she is quite unhappy group home which is her reason for leaving often. She does not get along with the people there. She denies any chest pain or difficulty breathing or pain complaints at this time. She was seen here 4 days ago for abdominal pain, nausea and vomiting however reports that this is getting better.   Past Medical History  Diagnosis Date  . Chronic kidney disease   . Hypertension   . Bipolar disorder ([place])   . Depression   . Depression   . Elevated lipids   . Cognitive changes   . Alcohol abuse     chronic  . Cognitive impairment   . Staph aureus infection     A/V fistula  . Diabetes mellitus without complication ([place])   . UTI (lower urinary tract infection)   . Hyperparathyroidism [facility])     Patient Active Problem List   Diagnosis Date Noted  . Diabetes ([place]) 04/23/2015  . Essential hypertension 04/23/2015  . ESRD on dialysis ([place]) 04/23/2015  . Pain of left arm 04/22/2015    Past Surgical History  Procedure Laterality Date  . Av fistula placement     . Cholecystectomy    . Dialysis perma cath Right     chest  . Vascular access device insertion Left 04/22/2015    Procedure: INSERTION OF HERO VASCULAR ACCESS DEVICE;  Surgeon: Katha Cabal, MD;  Location: [facility] ORS;  Service: Vascular;  Laterality: Left;  . Peripheral vascular catheterization N/A 05/24/2015    Procedure: Dialysis/Perma Catheter Removal;  Surgeon: Katha Cabal, MD;  Location: [place] [facility] LAB;  Service: Cardiovascular;  Laterality: N/A;    Current Outpatient Rx  Name  Route  Sig  Dispense  Refill  . amLODipine (NORVASC) 10 MG tablet   Oral   Take 10 mg by mouth at bedtime. *hold for systolic blood pressure [id number], call MD if holding this medication.*         . aspirin 81 MG chewable tablet   Oral   Chew 81 mg by mouth daily.         . B Complex-C-Folic Acid (RENA-VITE RX PO)   Oral   Take 1 tablet by mouth daily.         . calcium acetate (PHOSLO) 667 MG capsule   Oral   Take 1,334 mg by mouth 2 (two) times daily with a meal.          . cinacalcet (SENSIPAR) 60 MG tablet   Oral   Take 60 mg by mouth daily.         . cloNIDine (CATAPRES) 0.2 MG tablet   Oral  Take 0.2 mg by mouth 2 (two) times daily.         . divalproex (DEPAKOTE) 500 MG DR tablet   Oral   Take 1,000 mg by mouth at bedtime.          . docusate sodium (COLACE) 100 MG capsule   Oral   Take 100 mg by mouth at bedtime.          Marland Kitchen escitalopram (LEXAPRO) 10 MG tablet   Oral   Take 15 mg by mouth daily.         . folic acid (FOLVITE) 1 MG tablet   Oral   Take 2 mg by mouth at bedtime.          Marland Kitchen glucagon (GLUCAGEN HYPOKIT) 1 MG SOLR injection   Intramuscular   Inject 1 mg into the muscle once as needed for low blood sugar. For emergency use blood glucose <75 and not responsive.         . hydrALAZINE (APRESOLINE) 50 MG tablet   Oral   Take 50 mg by mouth 3 (three) times daily.         . insulin aspart (NOVOLOG) 100 UNIT/ML injection    Subcutaneous   Inject 0-12 Units into the skin 3 (three) times daily before meals. Per sliding scale         . insulin glargine (LANTUS) 100 unit/mL SOPN   Subcutaneous   Inject 15 Units into the skin at bedtime.         . lidocaine-prilocaine (EMLA) cream   Topical   Apply 1 application topically 3 (three) times a week. Apply to access site 30 minutes before dialysis.         Marland Kitchen medroxyPROGESTERone (DEPO-PROVERA) 150 MG/ML injection   Intramuscular   Inject 150 mg into the muscle every 3 (three) months.         . metoprolol (LOPRESSOR) 50 MG tablet   Oral   Take 50 mg by mouth 2 (two) times daily.         . Multiple Vitamin (THEREMS PO)   Oral   Take 1 tablet by mouth daily.         . ondansetron (ZOFRAN ODT) 4 MG disintegrating tablet   Oral   Take 1 tablet (4 mg total) by mouth every 8 (eight) hours as needed for nausea or vomiting.   20 tablet   0   . polyethylene glycol (MIRALAX / GLYCOLAX) packet   Oral   Take 17 g by mouth daily as needed for mild constipation, moderate constipation or severe constipation. Mix with 8 oz. of juice.         Marland Kitchen QUEtiapine (SEROQUEL) 200 MG tablet   Oral   Take 200 mg by mouth 2 (two) times daily.          . ranitidine (ZANTAC) 150 MG capsule   Oral   Take 1 capsule (150 mg total) by mouth 2 (two) times daily.   28 capsule   0   . traZODone (DESYREL) 100 MG tablet   Oral   Take 100 mg by mouth at bedtime.         . vitamin B-12 (CYANOCOBALAMIN) 1000 MCG tablet   Oral   Take 1,000 mcg by mouth at bedtime.            Allergies Review of patient's allergies indicates no known allergies.  No family history on file.  Social History Social History  Substance Use Topics  .  Smoking status: Current Every Day Smoker -- 0.25 packs/day for 0 years    Types: Cigarettes  . Smokeless tobacco: Current User  . Alcohol Use: No    Review of Systems Constitutional: No fever/chills Gastrointestinal: + abdominal  pain.  + nausea, + vomiting.  No diarrhea.  No constipation.  Caveat-history of present illness and review of systems limited slightly due to the patient's cognitive impairment. ____________________________________________   PHYSICAL EXAM:  VITAL SIGNS: ED Triage Vitals  Enc Vitals Group     BP 01/23/16 1746 214/95 mmHg     Pulse Rate 01/23/16 1746 87     Resp 01/23/16 1746 20     Temp 01/23/16 1746 98.5 F (36.9 C)     Temp Source 01/23/16 1746 Oral     SpO2 01/23/16 1746 100 %     Weight 01/23/16 1746 175 lb (79.379 kg)     Height 01/23/16 1746 5' (1.524 m)     Head Cir --      Peak Flow --      Pain Score 01/23/16 1748 10     Pain Loc --      Pain Edu? --      Excl. in [place]? --     Constitutional: Alert and interactive. In no acute distress. She is noted to be moderately intellectually disabled but is able to answer most simple questions appropriately. Eyes: Conjunctivae are normal. PERRL. EOMI. Head: Atraumatic. Nose: No congestion/rhinnorhea. Mouth/Throat: Mucous membranes are moist.  Oropharynx non-erythematous. Neck: No stridor. Appears supple without meningismus. Cardiovascular: Normal rate, regular rhythm. Grossly normal heart sounds.  Good peripheral circulation. Respiratory: Normal respiratory effort.  No retractions. Lungs CTAB. Gastrointestinal: Soft and nontender. No distention.  No CVA tenderness. Genitourinary: Deferred Musculoskeletal: No lower extremity tenderness nor edema.  No joint effusions. Neurologic:  Normal speech and language. No gross focal neurologic deficits are appreciated. No gait instability. Skin:  Skin is warm, dry and intact. No rash noted. Psychiatric: Mood is normal, affect is restricted.  ____________________________________________   LABS (all labs ordered are listed, but only abnormal results are displayed)  Labs Reviewed  COMPREHENSIVE METABOLIC PANEL - Abnormal; Notable for the following:    Chloride 98 (*)    Glucose, Bld  330 (*)    BUN 21 (*)    Creatinine, Ser 5.98 (*)    Total Bilirubin 0.1 (*)    GFR calc non Af Amer 8 (*)    GFR calc Af Amer 10 (*)    All other components within normal limits  ACETAMINOPHEN LEVEL - Abnormal; Notable for the following:    Acetaminophen (Tylenol), Serum <10 (*)    All other components within normal limits  CBC - Abnormal; Notable for the following:    RBC 3.53 (*)    Hemoglobin 11.1 (*)    HCT 34.5 (*)    RDW 15.7 (*)    All other components within normal limits  GLUCOSE, CAPILLARY - Abnormal; Notable for the following:    Glucose-Capillary 245 (*)    All other components within normal limits  ETHANOL  SALICYLATE LEVEL  URINE DRUG SCREEN, QUALITATIVE ([facility] ONLY)  POC URINE PREG, ED   ____________________________________________  EKG  None ____________________________________________  RADIOLOGY  none ____________________________________________   PROCEDURES  Procedure(s) performed: None  Critical Care performed: No  ____________________________________________   INITIAL IMPRESSION / ASSESSMENT AND PLAN / ED COURSE  Pertinent labs & imaging results that were available during my care of the patient were reviewed by me  and considered in my medical decision making (see chart for details).  Floria Karai Morean is a 35 y.o. female with diabetes, bipolar disorder, intellectual disability, end stage renal disease on dialysis (last dialyzed today), hypertension, who presents for evaluation of suicidal ideation with plan to cut herself with a knife. On Exam, she is nontoxic appearing and in no acute distress. She is hypertensive with blood pressure of 214/95 however his appears to be near her baseline. We will give her her home medications. Otherwise, the remainder of her vital signs are stable, she is afebrile. She has no acute medical complaints. On chart review, the patient is often here for either suicidal ideation or complaint of some degree of difficulty  with the group home. My concern for her is that she is quite cognitively impaired and it is dangerous for her to be roaming the streets especially at this late hour and I suspect that if she is discharged back to her group home, she will just run away again. Therefore, we'll keep her in the ER tonight, consult psychiatry, TTS, social work. Labs reviewed. CBC is notable for mild anemia, CMP with expected creatinine elevation, no normal potassium. She is medically cleared.  ____________________________________________   FINAL CLINICAL IMPRESSION(S) / ED DIAGNOSES  Final diagnoses:  Suicidal ideation      Joanne Gavel, MD 01/23/16 [phone]

## 2016-01-24 DIAGNOSIS — F0280 Dementia in other diseases classified elsewhere without behavioral disturbance: Secondary | ICD-10-CM

## 2016-01-24 DIAGNOSIS — F0281 Dementia in other diseases classified elsewhere with behavioral disturbance: Secondary | ICD-10-CM

## 2016-01-24 LAB — GLUCOSE, CAPILLARY
GLUCOSE-CAPILLARY: 75 mg/dL (ref 65–99)
GLUCOSE-CAPILLARY: 137 mg/dL — AB (ref 65–99)
GLUCOSE-CAPILLARY: 188 mg/dL — AB (ref 65–99)

## 2016-01-24 MED ORDER — INSULIN ASPART 100 UNIT/ML ~~LOC~~ SOLN
SUBCUTANEOUS | Status: AC
  Filled 2016-01-24: qty 2

## 2016-01-24 MED ORDER — HYDRALAZINE HCL 25 MG PO TABS
ORAL_TABLET | ORAL | Status: AC
  Administered 2016-01-24: 50 mg via ORAL
  Filled 2016-01-24: qty 2

## 2016-01-24 MED ORDER — ASPIRIN EC 81 MG PO TBEC
DELAYED_RELEASE_TABLET | ORAL | Status: AC
  Administered 2016-01-24: 81 mg
  Filled 2016-01-24: qty 1

## 2016-01-24 NOTE — ED Notes (Signed)
Pt compliant with evening medications. Pt currently resting in room. No concerns voiced. No distress noted. Maintained on 15 minute checks and observation by security camera for safety.

## 2016-01-24 NOTE — Clinical Social Work Note (Signed)
CSW received call back from Levander Campion at [facility] and she stated that they transferred official guardianship to [facility] [employer]: [name]: [phone]. CSW has left message for Ms. Thomas. In addition, CSW received call back from [place] at [facility] (she fills in for owner: Ivin Booty: when she is at her other job) who stated that they have been having a difficult time with patient walking off from the group home and going to different stores, eating what she shouldn't, and wanting to stay out and thus at times missing her medications. Izora Gala is in agreement to take patient back when time and CSW will update her when hospital MD has made a disposition. CSW received call back from [place] with [facility] [employer] and she stated that she has been working on trying to find a different placement for her. Tedra Coupe reports that she would be in agreement with patient returning to [facility] at this time. Tedra Coupe also has reported that patient is being difficult during her outpatient dialysis and has not been wanting to stay through her treatments. CSW will continue to follow. Shela Leff MSW,LCSW [phone]

## 2016-01-24 NOTE — Clinical Social Work Note (Signed)
CSW aware of consult on patient. Patient has been in and out of ED several times recently. Patient is developmentally disabled, at a group home ([facility]), has [place] as guardian, and is on chronic outpatient dialysis. Patient awaiting psychiatric evaluation currently for suicidal statements. CSW has left a message for group home owner: Chrystie Nose: [phone] and for DSS Guardian: Levander Campion: [phone]. Shela Leff MSW,LCSW [phone]

## 2016-01-24 NOTE — ED Notes (Signed)

## 2016-01-24 NOTE — ED Notes (Signed)
Patient asleep in room. No noted distress or abnormal behavior. Will continue 15 minute checks and observation by security cameras for safety. 

## 2016-01-24 NOTE — Clinical Social Work Note (Signed)
Spoke with Dr. Weber Cooks regarding psychiatry disposition. Dr. Weber Cooks stated that patient is not appropriate for behavioral med inpatient admission and that until patient is able to stop stating that she is going to run out in front of a car that patient would not be psychiatrically cleared to return to her group home. CSW has contacted the guardian to inform and contacted Izora Gala [phone]) at the group home. Will await psych clearance to return to her group home at this time. Shela Leff MSW,LCSW [phone]

## 2016-01-24 NOTE — ED Notes (Signed)
Pt sleeping in room. Easily aroused but uncooperative with medications at this time. Pt only wants to rest. Maintained on 15 minute checks and observation by security camera for safety.

## 2016-01-24 NOTE — ED Notes (Signed)
BEHAVIORAL HEALTH ROUNDING  Patient sleeping: NO  Patient alert and oriented: YES  Behavior appropriate: YES  Describe behavior: No inappropriate or unacceptable behaviors noted at this time.  Nutrition and fluids offered: No  Toileting and hygiene offered: No  Sitter present: Behavioral tech rounding every 15 minutes on patient to ensure safety.  Law enforcement present: Public relations account executive agency: [place] ([place])

## 2016-01-24 NOTE — ED Notes (Signed)
Pt refused to allow RN to check her blood sugar. RN explained the importance of checking her blood sugar regularly and complying with insulin regimen.

## 2016-01-24 NOTE — ED Notes (Signed)
Pt aroused and agreeable to taking aspirin. Pt stated she felt ok, but wanted to sleep more. No other concerns voiced. No distress noted. Maintained on 15 minute checks and observation by security camera for safety.

## 2016-01-24 NOTE — ED Notes (Signed)
Pt attempting to reach her guardian by phone. Pt coloring in her room. No concerns voiced. Maintained on 15 minute checks and observation by security camera for safety.

## 2016-01-24 NOTE — ED Provider Notes (Signed)
-----------------------------------------   7:59 AM on 01/24/2016 -----------------------------------------   Blood pressure 184/96, pulse 91, temperature 98.3 F (36.8 C), temperature source Oral, resp. rate 18, height 5' (1.524 m), weight 175 lb (79.379 kg), SpO2 97 %.  The patient had no acute events since last update.  Patient is not due for dialysis until tomorrow, Wednesday, March 1. Calm and cooperative at this time.  Disposition is pending per Psychiatry/Behavioral Medicine team recommendations.     Paulette Blanch, MD 01/24/16 0800

## 2016-01-24 NOTE — ED Notes (Addendum)
Pt denies HI and pain. Pt positive for SI with a plan to slit her wrists. Pt stated her mother is a drug addict and she used to do cocaine wit her father.  Pt states she does not like her group home. Pt asking for coloring books and crayons. Also requesting to call her guardian. RN provided the phone number. Pt pacing in dayroom as well as from her room to the dayroom. Easily redirected and cooperative. Compliant with mediations. Maintained on 15 minute checks and observation by security camera for safety.

## 2016-01-24 NOTE — ED Notes (Signed)
Pt has finished speaking with psychiatrist and is resting quietly in room. No noted distress or abnormal behaviors noted. Will continue 15 minute checks and observation by security camera for safety.
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
## 2016-01-25 LAB — GLUCOSE, CAPILLARY
GLUCOSE-CAPILLARY: 91 mg/dL (ref 65–99)
GLUCOSE-CAPILLARY: 213 mg/dL — AB (ref 65–99)
Glucose-Capillary: 63 mg/dL — ABNORMAL LOW (ref 65–99)
Glucose-Capillary: 73 mg/dL (ref 65–99)

## 2016-01-25 MED ORDER — INSULIN ASPART 100 UNIT/ML ~~LOC~~ SOLN
SUBCUTANEOUS | Status: AC
  Filled 2016-01-25: qty 5

## 2016-01-25 NOTE — ED Provider Notes (Signed)
-----------------------------------------   7:06 AM on 01/25/2016 -----------------------------------------   Blood pressure 161/73, pulse 91, temperature 98.5 F (36.9 C), temperature source Oral, resp. rate 20, height 5' (1.524 m), weight 175 lb (79.379 kg), SpO2 100 %.  The patient had no acute events since last update.  Calm and cooperative at this time.    According to psych the patient is not appropriate for an inpatient admission but they report that she is not cleared to return home until she is going to stop stating she is given a run in front of a car. We will have the patient reevaluated by psych to determine an appropriate disposition.     Loney Hering, MD 01/25/16 [phone]

## 2016-01-25 NOTE — ED Notes (Signed)
Belongings returned to patient. Pt denies SI/HI and AVH. Group home is here to pick up patient.

## 2016-01-25 NOTE — ED Notes (Signed)
Monica from social work called to inform RN she has left messages for the pt's guardian as well as the group home regarding patient's discharge.

## 2016-01-25 NOTE — ED Notes (Addendum)
Pt given dinner tray. Blood sugar to be re checked after pt eats. No concerns voiced. No distress noted. Maintained on 15 minute checks and observation by security camera for safety.

## 2016-01-25 NOTE — ED Notes (Signed)
Blood sugar re checked after approximately 30 minutes: CBG 73. Pt experiencing no symptoms or discomfort. No distress noted.

## 2016-01-25 NOTE — ED Notes (Signed)
Pt is asleep in bed this evening. Pt mood is depressed and her affect is blunted but she is pleasant and cooperative. Pt is somewhat withdrawn but does respond appropriately to staff requests. Pt HTN addressed by MD and medications were admitted early as a result. Pt v/s stabilized. Pt denies SI/HI and AVH at this time. Writer discussed plan of care. Snack provided and 15 minute checks are ongoing for safety.

## 2016-01-25 NOTE — ED Notes (Signed)
Patient asleep in room. No noted distress or abnormal behavior. Will continue 15 minute checks and observation by security cameras for safety. 
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
## 2016-01-25 NOTE — ED Notes (Signed)
Pt's CBG was 63. Dr. Ree Kida. OJ provided. Waiting on dietary to bring boxed lunch. Will re check CBG in 30 minutes per Dr. request.

## 2016-01-25 NOTE — ED Notes (Signed)
Pt needed encouragement, but was compliant with VS and medications.  Pt spoke with Kelly [name] (guardian). Pt was excited to speak with her. Pt stated Ms. Marcello Moores had found her another group home. RN did not speak with Ms. Marcello Moores to verify this information. No concerns voiced. No distress noted. Maintained on 15 minute checks and observation by security camera for safety.

## 2016-01-25 NOTE — ED Notes (Addendum)
Pt wandering from room to dayroom. Pt did not want to eat her lunch when it was delivered, but is now wanting another lunch tray. RN explained a boxed lunch was the only choice until dinner trays are served. Pt seemed to understand. No concerns voiced. No distress noted. Maintained on 15 minute checks and observation by security camera for safety.

## 2016-01-25 NOTE — ED Notes (Signed)
Pt asked to be covered with her blankets so she could take a nap. Compliant with medications.  No concerns voiced. No distress noted. Maintained on 15 minute checks and observation by security camera for safety.

## 2016-01-25 NOTE — Discharge Instructions (Signed)
°  Please return to the emergency department for thoughts of hurting yourself or anyone else, hallucinations, or any other symptoms concerning to you. Helping Someone Who is Suicidal Suicide is when someone takes his or her own life. Someone who is thinking about suicide needs immediate help. Although you might not know what to say or do to help, start by letting that person know you care. Listen to him or her. Then talk about how to get help. Help is available through therapy, medicine, and other treatments. WHAT ARE SIGNS THAT SOMEONE IS SUICIDAL? Common signs include:   Signs of depression, such as:  Rage.  Irritability.  Shame.  Excessive worry.  Loss of interest in things the person once enjoyed.  Changes in social behaviors and relationships, including:  Isolating oneself.  Withdrawing from friends and family.  Giving away possessions.  Saying good-bye.  Acting aggressively.  Sleeping more or less than usual.  Having trouble managing school or work.   Talking about feeling hopeless or being a burden.  Engaging in risky behaviors, such as drinking more alcohol or using more drugs. WHAT ARE THE RISK FACTORS FOR SUICIDE? Risk factors for suicide include:   Other suicides in the family.  A history of suicide attempts.  Depression or other mental health issues.  Being in jail or facing jail time.  Having had close friends who have committed suicide.  Alcohol or drug abuse, especially combined with a mental illness.  WHAT SHOULD I DO IF SOMEONE IS SUICIDAL? If you believe a person is in immediate danger of committing suicide, call your local emergency services (911 in the [place] for help. If a person says he or she wants to commit suicide, take the threat seriously. Help the person get help right away by:   Calling your local emergency services.  Calling a suicide prevention hotline.  Contacting a crisis center or a local suicide prevention center. These  are often located at hospitals, clinics, [facility], social service providers, or health departments. If a person confides in you that he or she is considering suicide:   Listen to the person's thoughts and concerns with compassion.  Let the person know you will stay with him or her.   Ask if the person is having thoughts of hurting himself or herself.   Offer to help the person get to a doctor or mental health professional.   Remove all weapons and medicines from the person's living space.  Do not promise to keep his or her thoughts of suicide a secret.   This information is not intended to replace advice given to you by your health care provider. Make sure you discuss any questions you have with your health care provider.   Document Released: 05/19/2003 Document Revised: 12/03/2014 Document Reviewed: 04/22/2014 Elsevier Interactive Patient Education [employer].

## 2016-01-25 NOTE — ED Notes (Signed)
Pt became intimidated by another patient's cursing. Pt encouraged to return to her room to watch tv and relax. Pt accepting and is resting in her room. No concerns voiced. No distress noted. Maintained on 15 minute checks and observation by security camera for safety.

## 2016-01-25 NOTE — Clinical Social Work Note (Signed)
[name], patient's guardian, called CSW back and is now aware and in agreement with patient's discharge back to [facility] group home. CSW contacted Flip, at [facility], and he is going to arrange transportation for patient to be picked up this evening. Shela Leff MSW,LCSW [phone]

## 2016-01-25 NOTE — ED Notes (Signed)
Pt aware she will be discharged back to her group home. Pt accepting. Group home states they will pick her up around 5pm.

## 2016-01-25 NOTE — Clinical Social Work Note (Signed)
Patient has been cleared by psychiatry for return to her group home today. CSW has left a message for patient's guardian: [name]: [phone] and for Izora Gala at [facility], the group home, [phone]. FL2 not required as patient has not had a change in medications. Shela Leff MSW,LCSW [phone]

## 2016-01-25 NOTE — ED Notes (Signed)
Patient resting quietly in room. No noted distress or abnormal behaviors noted. Will continue 15 minute checks and observation by security camera for safety. 

## 2016-01-25 NOTE — Consult Note (Signed)
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 Diabetes ([place]) [E11.9] 04/23/2015  . Essential hypertension [I10] 04/23/2015  . ESRD on dialysis ([place]) [N18.6, Z99.2] 04/23/2015  . Pain of left arm [M79.602] 04/22/2015    Total Time spent with patient: 30 minutes  Subjective:   Kelly [name] is a 35 y.o. female patient admitted with "again to find me a place to stay".  HPI:  Follow-up note. See history from yesterday. Today the patient says that her mood is feeling a little better. She says she has no plan or intention of trying to kill her self. Evidently she spoke to her guardian on the phone and she is believing that her guardian is going to help her to find a new place to live. Patient has not been explosive dangerous or done anything to try and kill her self in the hospital. She has multiple medical problems and would be best served by being back out in her home environment where she can stay on her regular medicine and get back into her dialysis routine. At this point I think she is no longer acutely dangerous doesn't require inpatient treatment.  Past Psychiatric History: Long-standing history of behavior problems related to brain injury with impulsivity and poor coping skills  Risk to Self: Suicidal Ideation: Yes-Currently Present Suicidal Intent: No-Not Currently/Within Last 6 Months Is patient at risk for suicide?: No Suicidal Plan?: Yes-Currently Present Specify Current Suicidal Plan: Cut wrists Access to Means: No What has been your use of drugs/alcohol within the last 12 months?: denied use of  drugs and alcohol How many times?: 4 (states more than 4 times ) Other Self Harm Risks: denied Triggers for Past Attempts: None known Intentional Self Injurious Behavior: None Risk to Others: Homicidal Ideation: No Thoughts of Harm to Others: No Current Homicidal Intent: No Current Homicidal Plan: No Access to Homicidal Means: No Identified Victim: denied History of harm to others?: No Assessment of Violence: None Noted Violent Behavior Description: may be verbally threatening Does patient have access to weapons?: No Criminal Charges Pending?: No Does patient have a court date: No Prior Inpatient Therapy: Prior Inpatient Therapy: No Prior Therapy Dates: n/a Prior Therapy Facilty/Provider(s): n/a Reason for Treatment: n/a Prior Outpatient Therapy: Prior Outpatient Therapy: No Prior Therapy Dates: n/a Prior Therapy Facilty/Provider(s): n/a Reason for Treatment: n/a Does patient have an ACCT team?: No Does patient have Intensive In-House Services?  : No Does patient have [facility] services? : No Does patient have P4[facility] services?: No  Past Medical History:  Past Medical History  Diagnosis Date  . Chronic kidney disease   . Hypertension   . Bipolar disorder ([place])   . Depression   . Depression   . Elevated lipids   . Cognitive changes   . Alcohol abuse     chronic  . Cognitive impairment   . Staph aureus infection     A/V fistula  . Diabetes mellitus without complication ([place])   . UTI (lower urinary tract infection)   . Hyperparathyroidism [facility])     Past Surgical History  Procedure Laterality Date  . Av fistula placement    . Cholecystectomy    .  Dialysis perma cath Right     chest  . Vascular access device insertion Left 04/22/2015    Procedure: INSERTION OF HERO VASCULAR ACCESS DEVICE;  Surgeon: Renford Dills, MD;  Location: [facility] ORS;  Service: Vascular;  Laterality: Left;  . Peripheral vascular catheterization N/A 05/24/2015    Procedure: Dialysis/Perma Catheter  Removal;  Surgeon: Renford Dills, MD;  Location: [facility] INVASIVE CV LAB;  Service: Cardiovascular;  Laterality: N/A;   Family History: No family history on file. Family Psychiatric  History: Nonidentified Social History:  History  Alcohol Use No     History  Drug Use No    Social History   Social History  . Marital Status: Single    Spouse Name: N/A  . Number of Children: N/A  . Years of Education: N/A   Social History Main Topics  . Smoking status: Current Every Day Smoker -- 0.25 packs/day for 0 years    Types: Cigarettes  . Smokeless tobacco: Current User  . Alcohol Use: No  . Drug Use: No  . Sexual Activity: Not Asked   Other Topics Concern  . None   Social History Narrative   Additional Social History:    Allergies:  No Known Allergies  Labs:  Results for orders placed or performed during the hospital encounter of 01/23/16 (from the past 48 hour(s))  Comprehensive metabolic panel     Status: Abnormal   Collection Time: 01/23/16  6:01 PM  Result Value Ref Range   Sodium 140 135 - 145 mmol/L   Potassium 3.7 3.5 - 5.1 mmol/L   Chloride 98 (L) 101 - 111 mmol/L   CO2 31 22 - 32 mmol/L   Glucose, Bld 330 (H) 65 - 99 mg/dL   BUN 21 (H) 6 - 20 mg/dL   Creatinine, Ser 1.55 (H) 0.44 - 1.00 mg/dL   Calcium 9.4 8.9 - 29.1 mg/dL   Total Protein 7.4 6.5 - 8.1 g/dL   Albumin 4.3 3.5 - 5.0 g/dL   AST 19 15 - 41 U/L   ALT 14 14 - 54 U/L   Alkaline Phosphatase 79 38 - 126 U/L   Total Bilirubin 0.1 (L) 0.3 - 1.2 mg/dL   GFR calc non Af Amer 8 (L) >60 mL/min   GFR calc Af Amer 10 (L) >60 mL/min    Comment: (NOTE) The eGFR has been calculated using the CKD EPI equation. This calculation has not been validated in all clinical situations. eGFR's persistently <60 mL/min signify possible Chronic Kidney Disease.    Anion gap 11 5 - 15  Ethanol (ETOH)     Status: None   Collection Time: 01/23/16  6:01 PM  Result Value Ref Range   Alcohol, Ethyl (B) <5 <5 mg/dL     Comment:        LOWEST DETECTABLE LIMIT FOR SERUM ALCOHOL IS 5 mg/dL FOR MEDICAL PURPOSES ONLY   Salicylate level     Status: None   Collection Time: 01/23/16  6:01 PM  Result Value Ref Range   Salicylate Lvl <4.0 2.8 - 30.0 mg/dL  Acetaminophen level     Status: Abnormal   Collection Time: 01/23/16  6:01 PM  Result Value Ref Range   Acetaminophen (Tylenol), Serum <10 (L) 10 - 30 ug/mL    Comment:        THERAPEUTIC CONCENTRATIONS VARY SIGNIFICANTLY. A RANGE OF 10-30 ug/mL MAY BE AN EFFECTIVE CONCENTRATION FOR MANY PATIENTS. HOWEVER, SOME ARE BEST TREATED AT CONCENTRATIONS OUTSIDE THIS RANGE. ACETAMINOPHEN  CONCENTRATIONS >150 ug/mL AT 4 HOURS AFTER INGESTION AND >50 ug/mL AT 12 HOURS AFTER INGESTION ARE OFTEN ASSOCIATED WITH TOXIC REACTIONS.   CBC     Status: Abnormal   Collection Time: 01/23/16  6:01 PM  Result Value Ref Range   WBC 5.3 3.6 - 11.0 K/uL   RBC 3.53 (L) 3.80 - 5.20 MIL/uL   Hemoglobin 11.1 (L) 12.0 - 16.0 g/dL   HCT 34.5 (L) 35.0 - 47.0 %   MCV 97.6 80.0 - 100.0 fL   MCH 31.5 26.0 - 34.0 pg   MCHC 32.2 32.0 - 36.0 g/dL   RDW 15.7 (H) 11.5 - 14.5 %   Platelets 167 150 - 440 K/uL  Glucose, capillary     Status: Abnormal   Collection Time: 01/23/16  9:36 PM  Result Value Ref Range   Glucose-Capillary 245 (H) 65 - 99 mg/dL  Glucose, capillary     Status: None   Collection Time: 01/24/16  7:54 AM  Result Value Ref Range   Glucose-Capillary 75 65 - 99 mg/dL   Comment 1 Notify RN   Glucose, capillary     Status: Abnormal   Collection Time: 01/24/16  4:10 PM  Result Value Ref Range   Glucose-Capillary 137 (H) 65 - 99 mg/dL  Glucose, capillary     Status: Abnormal   Collection Time: 01/24/16  8:49 PM  Result Value Ref Range   Glucose-Capillary 188 (H) 65 - 99 mg/dL  Glucose, capillary     Status: None   Collection Time: 01/25/16  7:19 AM  Result Value Ref Range   Glucose-Capillary 91 65 - 99 mg/dL   Comment 1 Notify RN    Comment 2 Document in Chart    Glucose, capillary     Status: Abnormal   Collection Time: 01/25/16 11:40 AM  Result Value Ref Range   Glucose-Capillary 213 (H) 65 - 99 mg/dL   Comment 1 Notify RN    Comment 2 Document in Chart     Current Facility-Administered Medications  Medication Dose Route Frequency Provider Last Rate Last Dose  . amLODipine (NORVASC) tablet 10 mg  10 mg Oral QHS Joanne Gavel, MD   10 mg at 01/24/16 2030  . aspirin chewable tablet 81 mg  81 mg Oral Daily Joanne Gavel, MD   81 mg at 01/25/16 7619  . b complex-vitamin c-folic acid (NEPHRO-VITE) tablet   Oral Daily Joanne Gavel, MD      . calcium acetate (PHOSLO) capsule 1,334 mg  1,334 mg Oral BID WC Joanne Gavel, MD   1,334 mg at 01/25/16 0740  . cinacalcet (SENSIPAR) tablet 60 mg  60 mg Oral Q breakfast Joanne Gavel, MD   60 mg at 01/25/16 0741  . cloNIDine (CATAPRES) tablet 0.2 mg  0.2 mg Oral BID Joanne Gavel, MD   0.2 mg at 01/25/16 0935  . divalproex (DEPAKOTE) DR tablet 1,000 mg  1,000 mg Oral QHS Joanne Gavel, MD   1,000 mg at 01/24/16 2029  . docusate sodium (COLACE) capsule 100 mg  100 mg Oral QHS Joanne Gavel, MD   100 mg at 01/24/16 2030  . escitalopram (LEXAPRO) tablet 15 mg  15 mg Oral Daily Joanne Gavel, MD   15 mg at 01/25/16 0940  . famotidine (PEPCID) tablet 20 mg  20 mg Oral BID Joanne Gavel, MD   20 mg at 01/25/16 0937  . folic acid (FOLVITE) tablet 2 mg  2 mg  Oral QHS Joanne Gavel, MD   2 mg at 01/24/16 2031  . glucagon (GLUCAGEN) injection 1 mg  1 mg Intramuscular Once PRN Joanne Gavel, MD      . hydrALAZINE (APRESOLINE) tablet 50 mg  50 mg Oral TID Joanne Gavel, MD   50 mg at 01/25/16 0937  . insulin aspart (novoLOG) 100 UNIT/ML injection           . insulin aspart (novoLOG) injection 0-15 Units  0-15 Units Subcutaneous TID WC Joanne Gavel, MD   5 Units at 01/25/16 1152  . insulin glargine (LANTUS) injection 15 Units  15 Units Subcutaneous QHS Joanne Gavel, MD   15 Units at 01/24/16 2120  . metoprolol  (LOPRESSOR) tablet 50 mg  50 mg Oral BID Joanne Gavel, MD   50 mg at 01/25/16 0936  . multivitamin with minerals tablet   Oral Daily Joanne Gavel, MD   1 tablet at 01/25/16 0935  . ondansetron (ZOFRAN-ODT) disintegrating tablet 4 mg  4 mg Oral Q8H PRN Joanne Gavel, MD      . polyethylene glycol (MIRALAX / GLYCOLAX) packet 17 g  17 g Oral Daily PRN Joanne Gavel, MD      . QUEtiapine (SEROQUEL) tablet 200 mg  200 mg Oral BID Joanne Gavel, MD   200 mg at 01/25/16 0935   Current Outpatient Prescriptions  Medication Sig Dispense Refill  . amLODipine (NORVASC) 10 MG tablet Take 10 mg by mouth at bedtime. *hold for systolic blood pressure <161, call MD if holding this medication.*    . aspirin 81 MG chewable tablet Chew 81 mg by mouth daily.    . B Complex-C-Folic Acid (RENA-VITE RX PO) Take 1 tablet by mouth daily.    . calcium acetate (PHOSLO) 667 MG capsule Take 1,334 mg by mouth 2 (two) times daily with a meal.     . cinacalcet (SENSIPAR) 60 MG tablet Take 60 mg by mouth daily.    . cloNIDine (CATAPRES) 0.2 MG tablet Take 0.2 mg by mouth 2 (two) times daily.    . divalproex (DEPAKOTE) 500 MG DR tablet Take 1,000 mg by mouth at bedtime.     . docusate sodium (COLACE) 100 MG capsule Take 100 mg by mouth at bedtime.     Marland Kitchen escitalopram (LEXAPRO) 10 MG tablet Take 15 mg by mouth daily.    . folic acid (FOLVITE) 1 MG tablet Take 2 mg by mouth at bedtime.     Marland Kitchen glucagon (GLUCAGEN HYPOKIT) 1 MG SOLR injection Inject 1 mg into the muscle once as needed for low blood sugar. For emergency use blood glucose <75 and not responsive.    . hydrALAZINE (APRESOLINE) 50 MG tablet Take 50 mg by mouth 3 (three) times daily.    . insulin aspart (NOVOLOG) 100 UNIT/ML injection Inject 0-12 Units into the skin 3 (three) times daily before meals. Per sliding scale    . insulin glargine (LANTUS) 100 unit/mL SOPN Inject 15 Units into the skin at bedtime.    . lidocaine-prilocaine (EMLA) cream Apply 1 application  topically 3 (three) times a week. Apply to access site 30 minutes before dialysis.    Marland Kitchen medroxyPROGESTERone (DEPO-PROVERA) 150 MG/ML injection Inject 150 mg into the muscle every 3 (three) months.    . metoprolol (LOPRESSOR) 50 MG tablet Take 50 mg by mouth 2 (two) times daily.    . Multiple Vitamin (THEREMS PO) Take 1 tablet by mouth daily.    Marland Kitchen  ondansetron (ZOFRAN ODT) 4 MG disintegrating tablet Take 1 tablet (4 mg total) by mouth every 8 (eight) hours as needed for nausea or vomiting. 20 tablet 0  . polyethylene glycol (MIRALAX / GLYCOLAX) packet Take 17 g by mouth daily as needed for mild constipation, moderate constipation or severe constipation. Mix with 8 oz. of juice.    Marland Kitchen QUEtiapine (SEROQUEL) 200 MG tablet Take 200 mg by mouth 2 (two) times daily.     . ranitidine (ZANTAC) 150 MG capsule Take 1 capsule (150 mg total) by mouth 2 (two) times daily. 28 capsule 0  . traZODone (DESYREL) 100 MG tablet Take 100 mg by mouth at bedtime.    . vitamin B-12 (CYANOCOBALAMIN) 1000 MCG tablet Take 1,000 mcg by mouth at bedtime.       Musculoskeletal: Strength & Muscle Tone: decreased Gait & Station: normal Patient leans: N/A  Psychiatric Specialty Exam: Review of Systems  Constitutional: Negative.   HENT: Negative.   Eyes: Negative.   Respiratory: Negative.   Cardiovascular: Negative.   Gastrointestinal: Negative.   Musculoskeletal: Negative.   Skin: Negative.   Neurological: Negative.   Psychiatric/Behavioral: Negative for depression, suicidal ideas, hallucinations, memory loss and substance abuse. The patient is not nervous/anxious and does not have insomnia.     Blood pressure 171/102, pulse 97, temperature 98.6 F (37 C), temperature source Oral, resp. rate 18, height 5' (1.524 m), weight 79.379 kg (175 lb), SpO2 100 %.Body mass index is 34.18 kg/(m^2).  General Appearance: Casual  Eye Contact::  Fair  Speech:  Slow  Volume:  Decreased  Mood:  Euthymic  Affect:  Appropriate   Thought Process:  Intact  Orientation:  Full (Time, Place, and Person)  Thought Content:  Negative  Suicidal Thoughts:  No  Homicidal Thoughts:  No  Memory:  Immediate;   Fair Recent;   Poor Remote;   Poor  Judgement:  Impaired  Insight:  Shallow  Psychomotor Activity:  Decreased  Concentration:  Poor  Recall:  Poor  Fund of Knowledge:Fair  Language: Poor  Akathisia:  No  Handed:  Right  AIMS (if indicated):     Assets:  Housing Social Support  ADL's:  Intact  Cognition: Impaired,  Moderate  Sleep:      Treatment Plan Summary: Plan Patient no longer acutely suicidal. I will discuss this with the emergency room physician. We will take her off commitment if she is on it but I think she may actually be voluntarily so here. Patient's acute suicidal ideation is resolved. Problems related to dementia unlikely to change any further. She can be discharged back to her group home for further outpatient care.  Disposition: Patient does not meet criteria for psychiatric inpatient admission. Supportive therapy provided about ongoing stressors.  Alethia Berthold, MD 01/25/2016 2:07 PM

## 2016-01-25 NOTE — ED Notes (Signed)
Pt has been cleared psychiatrically and will be discharged back to her group home later today.

## 2016-01-25 NOTE — ED Notes (Signed)
Pt wanted to rest after eating lunch. Attempted to call her father, but there was no answer. No further concerns. No distress noted. Maintained on 15 minute checks and observation by security camera for safety.

## 2016-01-25 NOTE — Clinical Social Work Note (Signed)
CSW reviewed nursing documentation. CSW has contacted Kelly [name]: patient's guardian and Ms. Marcello Moores did not tell patient that she had a new home for her but that she was looking and in the meantime, she was to return to her [facility]. CSW will continue to follow. Shela Leff MSW,LCSW [phone]

## 2016-02-15 ENCOUNTER — Encounter: Payer: Self-pay | Admitting: Emergency Medicine

## 2016-02-15 ENCOUNTER — Emergency Department
Admission: EM | Admit: 2016-02-15 | Discharge: 2016-02-15 | Disposition: A | Discharge status: Home / Self Care | Payer: Medicaid Other | Attending: Emergency Medicine | Admitting: Emergency Medicine

## 2016-02-15 DIAGNOSIS — Z992 Dependence on renal dialysis: Secondary | ICD-10-CM | POA: Insufficient documentation

## 2016-02-15 DIAGNOSIS — Y658 Other specified misadventures during surgical and medical care: Secondary | ICD-10-CM | POA: Insufficient documentation

## 2016-02-15 DIAGNOSIS — T82838A Hemorrhage of vascular prosthetic devices, implants and grafts, initial encounter: Secondary | ICD-10-CM | POA: Insufficient documentation

## 2016-02-15 DIAGNOSIS — I120 Hypertensive chronic kidney disease with stage 5 chronic kidney disease or end stage renal disease: Secondary | ICD-10-CM | POA: Insufficient documentation

## 2016-02-15 DIAGNOSIS — E119 Type 2 diabetes mellitus without complications: Secondary | ICD-10-CM | POA: Insufficient documentation

## 2016-02-15 DIAGNOSIS — N186 End stage renal disease: Secondary | ICD-10-CM | POA: Diagnosis not present

## 2016-02-15 DIAGNOSIS — F17210 Nicotine dependence, cigarettes, uncomplicated: Secondary | ICD-10-CM | POA: Diagnosis not present

## 2016-02-15 DIAGNOSIS — I770 Arteriovenous fistula, acquired: Secondary | ICD-10-CM

## 2016-02-15 NOTE — ED Notes (Signed)
Pt presents today from [facility]. Pt was seen at [facility] and presents today with c/o dialysis fistula bleeding uncontrollably. Pt presents with clamp over fistula on her L arm. Pt states she does not want to get along with her group home at this time. MD to bedside at this time. Pt presents with bandage to L arm, bleeding is controlled at this time. Per EMS the dialysis center states that pt finished treatment.

## 2016-02-15 NOTE — ED Notes (Signed)
[facility] called to transport pt back

## 2016-02-15 NOTE — ED Provider Notes (Signed)
-----------------------------------------   5:50 PM on 02/15/2016 -----------------------------------------  Patient reports bright red per rectum with bowel movement while in the ED. She was brought to a private room and I did a rectal exam. No noted hemorrhoids or active bleeding. Soft light brown stool. Hemoccult positive. Patient instructed to follow up with primary care physician for further evaluation.  Ponciano Ort, MD 02/15/16 1757

## 2016-02-15 NOTE — Discharge Instructions (Signed)
AV Fistula Refer to this sheet in the next few weeks. These instructions provide you with information on caring for yourself after your procedure. Your caregiver may also give you more specific instructions. Your treatment has been planned according to current medical practices, but problems sometimes occur. Call your caregiver if you have any problems or questions after your procedure. HOME CARE INSTRUCTIONS   Do not drive a car or take public transportation alone.  Do not drink alcohol.  Only take medicine that has been prescribed by your caregiver.  Do not sign important papers or make important decisions.  Have a responsible person with you.  Ask your caregiver to show you how to check your access at home for a vibration (called a "thrill") or for a sound (called a "bruit" pronounced brew-ee).  Your vein will need time to enlarge and mature so needles can be inserted for dialysis. Follow your caregiver's instructions about what you need to do to make this happen.  Keep dressings clean and dry.  Keep the arm elevated above your heart. Use a pillow.  Rest.  Use the arm as usual for all activities.  Have the stitches or tape closures removed in 10 to 14 days, or as directed by your caregiver.  Do not sleep or lie on the area of the fistula or that arm. This may decrease or stop the blood flow through your fistula.  Do not allow blood pressures to be taken on this arm.  Do not allow blood drawing to be done from the graft.  Do not wear tight clothing around the access site or on the arm.  Avoid lifting heavy objects with the arm that has the fistula.  Do not use creams or lotions over the access site. SEEK MEDICAL CARE IF:   You have a fever.  You have swelling around the fistula that gets worse, or you have new pain.  You have unusual bleeding at the fistula site or from any other area.  You have pus or other drainage at the fistula site.  You have skin redness or  red streaking on the skin around, above, or below the fistula site.  Your access site feels warm.  You have any flu-like symptoms. SEEK IMMEDIATE MEDICAL CARE IF:   You have pain, numbness, or an unusual pale skin on the hand or on the side of your fistula.  You have dizziness or weakness that you have not had before.  You have shortness of breath.  You have chest pain.  Your fistula disconnects or breaks, and there is bleeding that cannot be easily controlled. Call for local emergency medical help. Do not try to drive yourself to the hospital. MAKE SURE YOU  Understand these instructions.  Will watch your condition.  Will get help right away if you are not doing well or get worse.   This information is not intended to replace advice given to you by your health care provider. Make sure you discuss any questions you have with your health care provider.   Document Released: 11/12/2005 Document Revised: 12/03/2014 Document Reviewed: 05/02/2011 Elsevier Interactive Patient Education [employer].

## 2016-02-15 NOTE — ED Provider Notes (Signed)
[facility] Emergency Department Provider Note     Time seen: ----------------------------------------- 12:41 PM on 02/15/2016 -----------------------------------------    I have reviewed the triage vital signs and the nursing notes.   HISTORY  Chief Complaint Vascular Access Problem    HPI Kelly [name] is a 35 y.o. female who presents the ER for bleeding AV fistula. Patient had dialysis and completed dialysis today but has had persistent bleeding from the left AV graft site laterally. She has had a history of this before and is due for a revision of her AV fistula. Patient states that been holding pressure on her left arm for 2 hours.She plans of left arm pain and being hungry.   Past Medical History  Diagnosis Date  . Chronic kidney disease   . Hypertension   . Bipolar disorder ([place])   . Depression   . Depression   . Elevated lipids   . Cognitive changes   . Alcohol abuse     chronic  . Cognitive impairment   . Staph aureus infection     A/V fistula  . Diabetes mellitus without complication ([place])   . UTI (lower urinary tract infection)   . Hyperparathyroidism [facility])     Patient Active Problem List   Diagnosis Date Noted  . 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 Diabetes ([place]) 04/23/2015  . Essential hypertension 04/23/2015  . ESRD on dialysis ([place]) 04/23/2015  . Pain of left arm 04/22/2015    Past Surgical History  Procedure Laterality Date  . Av fistula placement    . Cholecystectomy    . Dialysis perma cath Right     chest  . Vascular access device insertion Left 04/22/2015    Procedure: INSERTION OF HERO VASCULAR ACCESS DEVICE;  Surgeon: Katha Cabal, MD;  Location: [facility] ORS;  Service: Vascular;  Laterality: Left;  . Peripheral vascular catheterization N/A 05/24/2015    Procedure: Dialysis/Perma Catheter Removal;  Surgeon: Katha Cabal, MD;  Location: [facility];  Service: Cardiovascular;   Laterality: N/A;    Allergies Review of patient's allergies indicates no known allergies.  Social History Social History  Substance Use Topics  . Smoking status: Current Every Day Smoker -- 0.25 packs/day for 0 years    Types: Cigarettes  . Smokeless tobacco: Current User  . Alcohol Use: No    Review of Systems Constitutional: Negative for fever. Cardiovascular: Negative for chest pain. Respiratory: Negative for shortness of breath. Gastrointestinal: Negative for abdominal pain, vomiting and diarrhea. Musculoskeletal: Positive for left arm pain Skin: Positive for bleeding left AV fistula site. Neurological: Negative for headaches, focal weakness or numbness.  ____________________________________________   PHYSICAL EXAM:  VITAL SIGNS: ED Triage Vitals  Enc Vitals Group     BP --      Pulse --      Resp --      Temp --      Temp src --      SpO2 --      Weight --      Height --      Head Cir --      Peak Flow --      Pain Score --      Pain Loc --      Pain Edu? --      Excl. in [place]? --     Constitutional: Alert and oriented. Well appearing and in no distress. Eyes: Conjunctivae are normal. Normal extraocular movements. Cardiovascular: Normal  rate, regular rhythm.  Respiratory: Normal respiratory effort without tachypnea nor retractions. Musculoskeletal: Palpable thrill is present in the left AV fistula, slight bleeding is noted. Neurologic:  Normal speech and language.  Skin:  There is bleeding that is very mild from the puncture site of the left AV fistula Psychiatric:Bizarre mood and affect ____________________________________________  ED COURSE:  Pertinent labs & imaging results that were available during my care of the patient were reviewed by me and considered in my medical decision making (see chart for details). Patient is in no acute distress, we will address the wound bleeding and reevaluate.  Wound REPAIR Performed by: Earleen Newport Authorized by: Lenise Arena E Consent: Verbal consent obtained. Risks and benefits: risks, benefits and alternatives were discussed Consent given by: patient Patient identity confirmed: provided demographic data Prepped and Draped in normal sterile fashion  Bleeding Location: Left arm AV fistula puncture site  Amount of cleaning: standard  Skin closure: Dermabond, Surgicel   Patient tolerance: Patient tolerated the procedure well with no immediate complications. I closed the punctate site with Dermabond, applied Surgicel and pressure dressing. I will reassess.  ____________________________________________  FINAL ASSESSMENT AND PLAN  AV fistula bleeding, hypertension  Plan: Patient with AV fistula bleeding after dialysis and accessed her fistula. Currently bleeding is controlled and has been so for some time. She remains hypertensive, is advised to take her blood pressure medicines without fail.  Earleen Newport, MD   Earleen Newport, MD 02/15/16 [phone]

## 2016-02-15 NOTE — ED Notes (Addendum)
Contact [facility], person o phone stated that transport had been sent.

## 2016-02-15 NOTE — ED Notes (Signed)
Report given to Anna, RN 

## 2016-02-15 NOTE — ED Notes (Addendum)
Pt c/o bright red rectal bleeding.  MD informed.  Rectal exam done in room 20 w/ this RN present.

## 2016-02-20 ENCOUNTER — Other Ambulatory Visit: Payer: Self-pay | Admitting: Vascular Surgery

## 2016-02-21 ENCOUNTER — Encounter
Admission: RE | Disposition: A | Discharge status: Home / Self Care | Payer: Self-pay | Source: Ambulatory Visit | Attending: Vascular Surgery

## 2016-02-21 ENCOUNTER — Ambulatory Visit
Admission: RE | Admit: 2016-02-21 | Discharge: 2016-02-21 | Disposition: A | Discharge status: Home / Self Care | Payer: Medicaid Other | Source: Ambulatory Visit | Attending: Vascular Surgery | Admitting: Vascular Surgery

## 2016-02-21 DIAGNOSIS — Z538 Procedure and treatment not carried out for other reasons: Secondary | ICD-10-CM | POA: Insufficient documentation

## 2016-02-21 DIAGNOSIS — X58XXXA Exposure to other specified factors, initial encounter: Secondary | ICD-10-CM | POA: Insufficient documentation

## 2016-02-21 DIAGNOSIS — T8249XA Other complication of vascular dialysis catheter, initial encounter: Secondary | ICD-10-CM | POA: Insufficient documentation

## 2016-02-21 LAB — POTASSIUM (ARMC VASCULAR LAB ONLY): Potassium (ARMC vascular lab): 4.7 (ref 3.5–5.1)

## 2016-02-21 SURGERY — A/V SHUNTOGRAM/FISTULAGRAM
Anesthesia: Moderate Sedation | Laterality: Left

## 2016-02-21 MED ORDER — FAMOTIDINE 20 MG PO TABS: 40.0000 mg | ORAL_TABLET | ORAL | Status: DC | PRN

## 2016-02-21 MED ORDER — DEXTROSE 5 % IV SOLN: 1.5000 g | INTRAVENOUS | Status: DC

## 2016-02-21 MED ORDER — METHYLPREDNISOLONE SODIUM SUCC 125 MG IJ SOLR: 125.0000 mg | INTRAMUSCULAR | Status: DC | PRN

## 2016-02-21 MED ORDER — HYDROMORPHONE HCL 1 MG/ML IJ SOLN: 1.0000 mg | Freq: Once | INTRAMUSCULAR | Status: DC

## 2016-02-21 MED ORDER — SODIUM CHLORIDE 0.9 % IV SOLN
INTRAVENOUS | Status: DC
  Administered 2016-02-21: 13:00:00 via INTRAVENOUS

## 2016-02-21 NOTE — OR Nursing (Signed)
Unable to obtain consent for fistulagram from legal guardian.  Unable to reach multiple voicemails left for her to return call.  At 3:50pm guardian ms. vonda thomas called to give consent via telephone for patient's fistulagram.  Will fax info to put in chart for guardianship.  Requested info on fistulagram to be sent to her.  fistulagram information faxed to ms. Marcello Moores dr. schnier made aware to reschedule procedure
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
## 2016-02-28 ENCOUNTER — Ambulatory Visit
Admission: RE | Admit: 2016-02-28 | Discharge: 2016-02-28 | Disposition: A | Discharge status: Home / Self Care | Payer: Medicaid Other | Source: Ambulatory Visit | Attending: Vascular Surgery | Admitting: Vascular Surgery

## 2016-02-28 ENCOUNTER — Encounter
Admission: RE | Disposition: A | Discharge status: Home / Self Care | Payer: Self-pay | Source: Ambulatory Visit | Attending: Vascular Surgery

## 2016-02-28 DIAGNOSIS — I10 Essential (primary) hypertension: Secondary | ICD-10-CM | POA: Insufficient documentation

## 2016-02-28 DIAGNOSIS — T82858A Stenosis of vascular prosthetic devices, implants and grafts, initial encounter: Secondary | ICD-10-CM | POA: Diagnosis not present

## 2016-02-28 DIAGNOSIS — Z9049 Acquired absence of other specified parts of digestive tract: Secondary | ICD-10-CM | POA: Diagnosis not present

## 2016-02-28 DIAGNOSIS — Z79899 Other long term (current) drug therapy: Secondary | ICD-10-CM | POA: Diagnosis not present

## 2016-02-28 DIAGNOSIS — F39 Unspecified mood [affective] disorder: Secondary | ICD-10-CM | POA: Insufficient documentation

## 2016-02-28 DIAGNOSIS — E785 Hyperlipidemia, unspecified: Secondary | ICD-10-CM | POA: Insufficient documentation

## 2016-02-28 DIAGNOSIS — Y832 Surgical operation with anastomosis, bypass or graft as the cause of abnormal reaction of the patient, or of later complication, without mention of misadventure at the time of the procedure: Secondary | ICD-10-CM | POA: Diagnosis not present

## 2016-02-28 DIAGNOSIS — Z7982 Long term (current) use of aspirin: Secondary | ICD-10-CM | POA: Diagnosis not present

## 2016-02-28 DIAGNOSIS — Z8614 Personal history of Methicillin resistant Staphylococcus aureus infection: Secondary | ICD-10-CM | POA: Insufficient documentation

## 2016-02-28 DIAGNOSIS — F17200 Nicotine dependence, unspecified, uncomplicated: Secondary | ICD-10-CM | POA: Diagnosis not present

## 2016-02-28 DIAGNOSIS — Z87440 Personal history of urinary (tract) infections: Secondary | ICD-10-CM | POA: Diagnosis not present

## 2016-02-28 DIAGNOSIS — N186 End stage renal disease: Secondary | ICD-10-CM | POA: Insufficient documentation

## 2016-02-28 DIAGNOSIS — F10288 Alcohol dependence with other alcohol-induced disorder: Secondary | ICD-10-CM | POA: Insufficient documentation

## 2016-02-28 DIAGNOSIS — Z992 Dependence on renal dialysis: Secondary | ICD-10-CM | POA: Diagnosis not present

## 2016-02-28 DIAGNOSIS — Z87898 Personal history of other specified conditions: Secondary | ICD-10-CM | POA: Diagnosis not present

## 2016-02-28 DIAGNOSIS — I120 Hypertensive chronic kidney disease with stage 5 chronic kidney disease or end stage renal disease: Secondary | ICD-10-CM | POA: Insufficient documentation

## 2016-02-28 DIAGNOSIS — Z6833 Body mass index (BMI) 33.0-33.9, adult: Secondary | ICD-10-CM | POA: Diagnosis not present

## 2016-02-28 DIAGNOSIS — E1122 Type 2 diabetes mellitus with diabetic chronic kidney disease: Secondary | ICD-10-CM | POA: Diagnosis not present

## 2016-02-28 HISTORY — PX: PERIPHERAL VASCULAR CATHETERIZATION: SHX172C

## 2016-02-28 LAB — GLUCOSE, CAPILLARY: Glucose-Capillary: 182 mg/dL — ABNORMAL HIGH (ref 65–99)

## 2016-02-28 LAB — POTASSIUM (ARMC VASCULAR LAB ONLY): Potassium (ARMC vascular lab): 4 (ref 3.5–5.1)

## 2016-02-28 SURGERY — A/V SHUNTOGRAM/FISTULAGRAM
Anesthesia: Moderate Sedation

## 2016-02-28 MED ORDER — LIDOCAINE HCL (PF) 1 % IJ SOLN
INTRAMUSCULAR | Status: DC | PRN
  Administered 2016-02-28: 5 mL via INTRADERMAL

## 2016-02-28 MED ORDER — DIPHENHYDRAMINE HCL 50 MG/ML IJ SOLN
INTRAMUSCULAR | Status: AC
  Filled 2016-02-28: qty 1

## 2016-02-28 MED ORDER — HEPARIN (PORCINE) IN NACL 2-0.9 UNIT/ML-% IJ SOLN
INTRAMUSCULAR | Status: AC
  Filled 2016-02-28: qty 1000

## 2016-02-28 MED ORDER — FENTANYL CITRATE (PF) 100 MCG/2ML IJ SOLN
INTRAMUSCULAR | Status: DC | PRN
  Administered 2016-02-28 (×2): 50 ug via INTRAVENOUS

## 2016-02-28 MED ORDER — CEFAZOLIN SODIUM 1-5 GM-% IV SOLN: 1.0000 g | Freq: Once | INTRAVENOUS | Status: DC

## 2016-02-28 MED ORDER — DIPHENHYDRAMINE HCL 50 MG/ML IJ SOLN
INTRAMUSCULAR | Status: DC | PRN
  Administered 2016-02-28: 50 mg via INTRAVENOUS

## 2016-02-28 MED ORDER — HYDRALAZINE HCL 20 MG/ML IJ SOLN
INTRAMUSCULAR | Status: AC
  Filled 2016-02-28: qty 1

## 2016-02-28 MED ORDER — DEXTROSE 5 % IV SOLN
1.5000 g | Freq: Once | INTRAVENOUS | Status: AC
  Administered 2016-02-28: 1.5 g via INTRAVENOUS

## 2016-02-28 MED ORDER — HEPARIN SODIUM (PORCINE) 1000 UNIT/ML IJ SOLN
INTRAMUSCULAR | Status: AC
  Filled 2016-02-28: qty 1

## 2016-02-28 MED ORDER — HYDRALAZINE HCL 20 MG/ML IJ SOLN
INTRAMUSCULAR | Status: DC | PRN
  Administered 2016-02-28: 10 mg via INTRAVENOUS

## 2016-02-28 MED ORDER — LIDOCAINE HCL (PF) 1 % IJ SOLN
INTRAMUSCULAR | Status: AC
  Filled 2016-02-28: qty 30

## 2016-02-28 MED ORDER — MIDAZOLAM HCL 5 MG/5ML IJ SOLN
INTRAMUSCULAR | Status: AC
  Filled 2016-02-28: qty 5

## 2016-02-28 MED ORDER — IOPAMIDOL (ISOVUE-300) INJECTION 61%
INTRAVENOUS | Status: DC | PRN
  Administered 2016-02-28: 10 mL via INTRA_ARTERIAL

## 2016-02-28 MED ORDER — SODIUM CHLORIDE 0.9 % IV SOLN
INTRAVENOUS | Status: DC
  Administered 2016-02-28: 13:00:00 via INTRAVENOUS

## 2016-02-28 MED ORDER — FENTANYL CITRATE (PF) 100 MCG/2ML IJ SOLN
INTRAMUSCULAR | Status: AC
  Filled 2016-02-28: qty 2

## 2016-02-28 MED ORDER — MIDAZOLAM HCL 2 MG/2ML IJ SOLN
INTRAMUSCULAR | Status: DC | PRN
  Administered 2016-02-28: 2 mg via INTRAVENOUS
  Administered 2016-02-28: 1 mg via INTRAVENOUS

## 2016-02-28 SURGICAL SUPPLY — 12 items
BALLN DORADO 8X60X80 (BALLOONS) ×4
BALLOON DORADO 8X60X80 (BALLOONS) ×2 IMPLANT
DEVICE PRESTO INFLATION (MISCELLANEOUS) ×4 IMPLANT
DRAPE BRACHIAL (DRAPES) ×12 IMPLANT
PACK ANGIOGRAPHY (CUSTOM PROCEDURE TRAY) ×4 IMPLANT
SET INTRO CAPELLA COAXIAL (SET/KITS/TRAYS/PACK) ×4 IMPLANT
SHEATH BRITE TIP 6FRX5.5 (SHEATH) ×4 IMPLANT
SHEATH BRITE TIP 7FRX5.5 (SHEATH) ×4 IMPLANT
STENT VIABAHN 8X100X120 (Permanent Stent) ×2 IMPLANT
STENT VIABAHN 8X10X120 (Permanent Stent) ×2 IMPLANT
TOWEL OR 17X26 4PK STRL BLUE (TOWEL DISPOSABLE) ×12 IMPLANT
WIRE G 018X200 V18 (WIRE) ×4 IMPLANT

## 2016-02-28 NOTE — H&P (Signed)
[facility] History & Physical Update  The patient was interviewed and re-examined.  The patient's previous History and Physical has been reviewed and is unchanged.  There is no change in the plan of care. We plan to proceed with the scheduled procedure.  Gavino Fouch, Dolores Lory, MD  02/28/2016, 1:09 PM

## 2016-02-28 NOTE — Op Note (Signed)
OPERATIVE NOTE   PROCEDURE: 1. Contrast injection left arm hero  AV access 2. Percutaneous transluminal angioplasty and stent placement venous portion left arm hero graft  PRE-OPERATIVE DIAGNOSIS: Complication of dialysis access                                                       End Stage Renal Disease  POST-OPERATIVE DIAGNOSIS: same as above   SURGEON: Katha Cabal, M.D.  ANESTHESIA: Conscious Sedation   ESTIMATED BLOOD LOSS: minimal  FINDING(S): 1. 2 large pseudoaneurysms associated with a high-grade stenosis within the venous portion of the hero graft  SPECIMEN(S):  None  CONTRAST: 30 cc  FLUOROSCOPY TIME: 2.5 minutes  INDICATIONS: Kelly [name] is a 35 y.o. female who  presents with malfunctioning left arm AV access.  The patient is scheduled for angiography with possible intervention of the AV access.  The patient is aware the risks include but are not limited to: bleeding, infection, thrombosis of the cannulated access, and possible anaphylactic reaction to the contrast.  The patient acknowledges if the access can not be salvaged a tunneled catheter will be needed and will be placed during this procedure.  The patient is aware of the risks of the procedure and elects to proceed with the angiogram and intervention.  DESCRIPTION: After full informed written consent was obtained, the patient was brought back to the Special Procedure suite and placed supine position.  Appropriate cardiopulmonary monitors were placed.  The left arm was prepped and draped in the standard fashion.  Appropriate timeout is called. The left hero graft  was cannulated with a micropuncture needle.  The microwire was advanced and the needle was exchanged for  a microsheath.  The J-wire was then advanced and a 6 Fr sheath inserted.  Hand injections were completed to image the access from the arterial anastomosis through the entire access.  The central venous structures were also imaged by hand  injections.  Based on the images,  3000 units of heparin was given and a wire was negotiated through the strictures within the venous portion of the graft.  An 8 mm x 100 mm Viabahn was deployed across the stenoses and postdilated with an 8 mm Dorado balloon.  Follow-up imaging demonstrates complete resolution of the stricture with rapid flow of contrast through the graft, the central venous anatomy is preserved.  A 4-0 Monocryl purse-string suture was sewn around the sheath.  The sheath was removed and light pressure was applied.  A sterile bandage was applied to the puncture site.    COMPLICATIONS: None  CONDITION: Kelly [name], M.D [facility] Office: [phone]  02/28/2016 1:45 PM

## 2016-02-29 ENCOUNTER — Encounter: Payer: Self-pay | Admitting: Vascular Surgery

## 2016-05-09 ENCOUNTER — Emergency Department
Admission: EM | Admit: 2016-05-09 | Discharge: 2016-05-09 | Disposition: A | Discharge status: Home / Self Care | Payer: MEDICAID | Attending: Emergency Medicine | Admitting: Emergency Medicine

## 2016-05-09 DIAGNOSIS — E213 Hyperparathyroidism, unspecified: Secondary | ICD-10-CM | POA: Insufficient documentation

## 2016-05-09 DIAGNOSIS — F4325 Adjustment disorder with mixed disturbance of emotions and conduct: Secondary | ICD-10-CM

## 2016-05-09 DIAGNOSIS — F17210 Nicotine dependence, cigarettes, uncomplicated: Secondary | ICD-10-CM | POA: Diagnosis not present

## 2016-05-09 DIAGNOSIS — F32A Depression, unspecified: Secondary | ICD-10-CM

## 2016-05-09 DIAGNOSIS — Z7982 Long term (current) use of aspirin: Secondary | ICD-10-CM | POA: Diagnosis not present

## 2016-05-09 DIAGNOSIS — Z992 Dependence on renal dialysis: Secondary | ICD-10-CM | POA: Diagnosis not present

## 2016-05-09 DIAGNOSIS — Z79899 Other long term (current) drug therapy: Secondary | ICD-10-CM | POA: Diagnosis not present

## 2016-05-09 DIAGNOSIS — Z794 Long term (current) use of insulin: Secondary | ICD-10-CM | POA: Insufficient documentation

## 2016-05-09 DIAGNOSIS — E1122 Type 2 diabetes mellitus with diabetic chronic kidney disease: Secondary | ICD-10-CM | POA: Diagnosis not present

## 2016-05-09 DIAGNOSIS — R45851 Suicidal ideations: Secondary | ICD-10-CM | POA: Diagnosis present

## 2016-05-09 DIAGNOSIS — F329 Major depressive disorder, single episode, unspecified: Secondary | ICD-10-CM | POA: Insufficient documentation

## 2016-05-09 DIAGNOSIS — I120 Hypertensive chronic kidney disease with stage 5 chronic kidney disease or end stage renal disease: Secondary | ICD-10-CM | POA: Insufficient documentation

## 2016-05-09 DIAGNOSIS — N186 End stage renal disease: Secondary | ICD-10-CM | POA: Insufficient documentation

## 2016-05-09 LAB — COMPREHENSIVE METABOLIC PANEL
ALK PHOS: 64 U/L (ref 38–126)
ALT: 12 U/L — AB (ref 14–54)
AST: 18 U/L (ref 15–41)
Albumin: 4 g/dL (ref 3.5–5.0)
Anion gap: 12 (ref 5–15)
BILIRUBIN TOTAL: 0.7 mg/dL (ref 0.3–1.2)
BUN: 23 mg/dL — AB (ref 6–20)
CO2: 33 mmol/L — ABNORMAL HIGH (ref 22–32)
Calcium: 8.9 mg/dL (ref 8.9–10.3)
Chloride: 95 mmol/L — ABNORMAL LOW (ref 101–111)
Creatinine, Ser: 5.58 mg/dL — ABNORMAL HIGH (ref 0.44–1.00)
GFR, EST AFRICAN AMERICAN: 11 mL/min — AB (ref >60)
GFR, EST NON AFRICAN AMERICAN: 9 mL/min — AB (ref >60)
Glucose, Bld: 247 mg/dL — ABNORMAL HIGH (ref 65–99)
Potassium: 3.7 mmol/L (ref 3.5–5.1)
Sodium: 140 mmol/L (ref 135–145)
Total Protein: 7.3 g/dL (ref 6.5–8.1)

## 2016-05-09 LAB — CBC
HCT: 37.9 % (ref 35.0–47.0)
HEMOGLOBIN: 12.5 g/dL (ref 12.0–16.0)
MCH: 34.5 pg — ABNORMAL HIGH (ref 26.0–34.0)
MCHC: 33 g/dL (ref 32.0–36.0)
MCV: 104.5 fL — AB (ref 80.0–100.0)
PLATELETS: 149 10*3/uL — AB (ref 150–440)
RBC: 3.62 MIL/uL — AB (ref 3.80–5.20)
RDW: 18.8 % — ABNORMAL HIGH (ref 11.5–14.5)
WBC: 4.4 10*3/uL (ref 3.6–11.0)

## 2016-05-09 LAB — SALICYLATE LEVEL: Salicylate Lvl: <4 mg/dL (ref 2.8–30.0)

## 2016-05-09 LAB — ETHANOL

## 2016-05-09 LAB — ACETAMINOPHEN LEVEL: Acetaminophen (Tylenol), Serum: <10 ug/mL — ABNORMAL LOW (ref 10–30)

## 2016-05-09 NOTE — ED Notes (Signed)
Pt arrives here with [employer] officer from dialysis  Pt resides at [facility] and while she was at dialysis today she expressed tot he RN there that she was feeling suicidal   Pt reports to me upon arrival  "I want to cut my wrists."

## 2016-05-09 NOTE — ED Notes (Signed)
Lunch tray given. 
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
## 2016-05-09 NOTE — ED Provider Notes (Signed)
-----------------------------------------   4:27 PM on 05/09/2016 -----------------------------------------  Dr. Weber Cooks of psychiatry has evaluated the patient, lifted her IVC and recommends discharge back to her group home with outpatient follow-up. We'll DC with return precautions.  Joanne Gavel, MD 05/09/16 [phone]

## 2016-05-09 NOTE — ED Notes (Signed)
Attempted to call pt's legal guardian, no answer, left message in regard to pt's discharge.

## 2016-05-09 NOTE — ED Notes (Signed)
Pt given cup of diet sprite.  

## 2016-05-09 NOTE — ED Notes (Signed)
Pt states that she does not make much urine due to kidney failure; instructed to use cup when she felt the urge.

## 2016-05-09 NOTE — ED Notes (Signed)
Psych at bedside.

## 2016-05-09 NOTE — ED Notes (Signed)
Pt given apple sauce

## 2016-05-09 NOTE — ED Provider Notes (Signed)
[facility] Emergency Department Provider Note    ____________________________________________  Time seen: ~1215  I have reviewed the triage vital signs and the nursing notes.   HISTORY  Chief Complaint Suicidal and Mental Health Problem   History limited by: Not Limited   HPI Kelly [name] is a 35 y.o. female who presents to the emergency department today via dialysis because of concerns for suicidal ideation. The patient says that she has a long history of depression and has had suicidal ideation in the past. She says for the past few days things have been worse. She does have a plan to cut her wrists. She was able to complete her full dialysis course. Denies any recent fevers.   Past Medical History  Diagnosis Date  . Chronic kidney disease   . Hypertension   . Bipolar disorder ([place])   . Depression   . Depression   . Elevated lipids   . Cognitive changes   . Alcohol abuse     chronic  . Cognitive impairment   . Staph aureus infection     A/V fistula  . Diabetes mellitus without complication ([place])   . UTI (lower urinary tract infection)   . Hyperparathyroidism [facility])     Patient Active Problem List   Diagnosis Date Noted  . 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 Diabetes ([place]) 04/23/2015  . Essential hypertension 04/23/2015  . ESRD on dialysis ([place]) 04/23/2015  . Pain of left arm 04/22/2015    Past Surgical History  Procedure Laterality Date  . Av fistula placement    . Cholecystectomy    . Dialysis perma cath Right     chest  . Vascular access device insertion Left 04/22/2015    Procedure: INSERTION OF HERO VASCULAR ACCESS DEVICE;  Surgeon: Katha Cabal, MD;  Location: [facility] ORS;  Service: Vascular;  Laterality: Left;  . Peripheral vascular catheterization N/A 05/24/2015    Procedure: Dialysis/Perma Catheter Removal;  Surgeon: Katha Cabal, MD;  Location: [place] CV LAB;  Service: Cardiovascular;   Laterality: N/A;  . Peripheral vascular catheterization Left 02/28/2016    Procedure: A/V Shuntogram/Fistulagram;  Surgeon: Katha Cabal, MD;  Location: [place] CV LAB;  Service: Cardiovascular;  Laterality: Left;  . Peripheral vascular catheterization N/A 02/28/2016    Procedure: A/V Shunt Intervention;  Surgeon: Katha Cabal, MD;  Location: [place] CV LAB;  Service: Cardiovascular;  Laterality: N/A;    Current Outpatient Rx  Name  Route  Sig  Dispense  Refill  . amLODipine (NORVASC) 10 MG tablet   Oral   Take 10 mg by mouth at bedtime. *hold for systolic blood pressure [id number], call MD if holding this medication.*         . aspirin 81 MG chewable tablet   Oral   Chew 81 mg by mouth daily.         . B Complex-C-Folic Acid (RENA-VITE RX PO)   Oral   Take 1 tablet by mouth daily.         . calcium acetate (PHOSLO) 667 MG capsule   Oral   Take 1,334 mg by mouth 2 (two) times daily with a meal.          . cinacalcet (SENSIPAR) 60 MG tablet   Oral   Take 60 mg by mouth daily.         . cloNIDine (CATAPRES) 0.2 MG tablet   Oral   Take 0.2 mg by mouth  2 (two) times daily.         . divalproex (DEPAKOTE) 500 MG DR tablet   Oral   Take 1,000 mg by mouth at bedtime.          . docusate sodium (COLACE) 100 MG capsule   Oral   Take 100 mg by mouth at bedtime.          Marland Kitchen escitalopram (LEXAPRO) 10 MG tablet   Oral   Take 15 mg by mouth daily.         . folic acid (FOLVITE) 1 MG tablet   Oral   Take 2 mg by mouth at bedtime.          Marland Kitchen glucagon (GLUCAGEN HYPOKIT) 1 MG SOLR injection   Intramuscular   Inject 1 mg into the muscle once as needed for low blood sugar. For emergency use blood glucose <75 and not responsive.         . hydrALAZINE (APRESOLINE) 50 MG tablet   Oral   Take 50 mg by mouth 3 (three) times daily.         . insulin aspart (NOVOLOG) 100 UNIT/ML injection   Subcutaneous   Inject 0-12 Units into the skin 3 (three)  times daily before meals. Per sliding scale         . insulin glargine (LANTUS) 100 unit/mL SOPN   Subcutaneous   Inject 15 Units into the skin at bedtime.         . lidocaine-prilocaine (EMLA) cream   Topical   Apply 1 application topically 3 (three) times a week. Apply to access site 30 minutes before dialysis.         Marland Kitchen medroxyPROGESTERone (DEPO-PROVERA) 150 MG/ML injection   Intramuscular   Inject 150 mg into the muscle every 3 (three) months.         . metoprolol (LOPRESSOR) 50 MG tablet   Oral   Take 50 mg by mouth 2 (two) times daily.         . Multiple Vitamin (THEREMS PO)   Oral   Take 1 tablet by mouth daily.         . ondansetron (ZOFRAN ODT) 4 MG disintegrating tablet   Oral   Take 1 tablet (4 mg total) by mouth every 8 (eight) hours as needed for nausea or vomiting.   20 tablet   0   . polyethylene glycol (MIRALAX / GLYCOLAX) packet   Oral   Take 17 g by mouth daily as needed for mild constipation, moderate constipation or severe constipation. Mix with 8 oz. of juice.         Marland Kitchen QUEtiapine (SEROQUEL) 200 MG tablet   Oral   Take 200 mg by mouth 2 (two) times daily.          . ranitidine (ZANTAC) 150 MG capsule   Oral   Take 1 capsule (150 mg total) by mouth 2 (two) times daily.   28 capsule   0   . traZODone (DESYREL) 100 MG tablet   Oral   Take 100 mg by mouth at bedtime.         . vitamin B-12 (CYANOCOBALAMIN) 1000 MCG tablet   Oral   Take 1,000 mcg by mouth at bedtime.            Allergies Review of patient's allergies indicates no known allergies.  No family history on file.  Social History Social History  Substance Use Topics  . Smoking status: Current Every  Day Smoker -- 0.25 packs/day for 0 years    Types: Cigarettes  . Smokeless tobacco: Current User  . Alcohol Use: No    Review of Systems  Constitutional: Negative for fever. Cardiovascular: Negative for chest pain. Respiratory: Negative for shortness of  breath. Gastrointestinal: Negative for abdominal pain, vomiting and diarrhea. Neurological: Negative for headaches, focal weakness or numbness.  10-point ROS otherwise negative.  ____________________________________________   PHYSICAL EXAM:  VITAL SIGNS: ED Triage Vitals  Enc Vitals Group     BP 05/09/16 1033 179/77 mmHg     Pulse Rate 05/09/16 1033 80     Resp 05/09/16 1033 18     Temp 05/09/16 1033 97.4 F (36.3 C)     Temp Source 05/09/16 1033 Oral     SpO2 05/09/16 1033 99 %     Weight 05/09/16 1033 176 lb (79.833 kg)     Height 05/09/16 1033 5\' 2"  (1.575 m)     Head Cir --      Peak Flow --      Pain Score 05/09/16 1034 10   Constitutional: Alert and oriented. Depressed Eyes: Conjunctivae are normal. PERRL. Normal extraocular movements. ENT   Head: Normocephalic and atraumatic.   Nose: No congestion/rhinnorhea.   Mouth/Throat: Mucous membranes are moist.   Neck: No stridor. Hematological/Lymphatic/Immunilogical: No cervical lymphadenopathy. Cardiovascular: Normal rate, regular rhythm.  No murmurs, rubs, or gallops. Respiratory: Normal respiratory effort without tachypnea nor retractions. Breath sounds are clear and equal bilaterally. No wheezes/rales/rhonchi. Gastrointestinal: Soft and nontender. No distention. There is no CVA tenderness. Genitourinary: Deferred Musculoskeletal: Normal range of motion in all extremities. No joint effusions.  No lower extremity tenderness nor edema. Neurologic:  Normal speech and language. No gross focal neurologic deficits are appreciated.  Skin:  Skin is warm, dry and intact. No rash noted. Psychiatric: Depressed. Endorses SI.  ____________________________________________    LABS (pertinent positives/negatives)  Labs Reviewed  COMPREHENSIVE METABOLIC PANEL - Abnormal; Notable for the following:    Chloride 95 (*)    CO2 33 (*)    Glucose, Bld 247 (*)    BUN 23 (*)    Creatinine, Ser 5.58 (*)    ALT 12 (*)     GFR calc non Af Amer 9 (*)    GFR calc Af Amer 11 (*)    All other components within normal limits  ACETAMINOPHEN LEVEL - Abnormal; Notable for the following:    Acetaminophen (Tylenol), Serum <10 (*)    All other components within normal limits  CBC - Abnormal; Notable for the following:    RBC 3.62 (*)    MCV 104.5 (*)    MCH 34.5 (*)    RDW 18.8 (*)    Platelets 149 (*)    All other components within normal limits  ETHANOL  SALICYLATE LEVEL  URINE DRUG SCREEN, QUALITATIVE ([facility] ONLY)  POC URINE PREG, ED     ____________________________________________   EKG  None  ____________________________________________    RADIOLOGY  None  ____________________________________________   PROCEDURES  Procedure(s) performed: None  Critical Care performed: No  ____________________________________________   INITIAL IMPRESSION / ASSESSMENT AND PLAN / ED COURSE  Pertinent labs & imaging results that were available during my care of the patient were reviewed by me and considered in my medical decision making (see chart for details).  Patient presents to the emergency department today from dialysis because of concerns for depression and suicidal ideation. On exam patient does endorse suicidal ideation. Given history of depression plan  to cut wrists will place patient under IVC. Blood work shows normal potassium, elevated creatinine which is not unexpected given history of dialysis.  ____________________________________________   FINAL CLINICAL IMPRESSION(S) / ED DIAGNOSES  Final diagnoses:  Depression     Note: This dictation was prepared with Dragon dictation. Any transcriptional errors that result from this process are unintentional    Nance Pear, MD 05/09/16 1442

## 2016-05-09 NOTE — ED Notes (Signed)
Pt given dinner tray, eating at this time.

## 2016-05-09 NOTE — ED Notes (Signed)
Papers rescinded by Dr. Weber Cooks  1600

## 2016-05-09 NOTE — ED Notes (Signed)
Pt facility [facility]" states that they will send someone to pick patient up and take patient back home.

## 2016-05-09 NOTE — ED Notes (Signed)
Pt alert and oriented X4, active, cooperative, pt in NAD. RR even and unlabored, color WNL.  Pt informed to return if any life threatening symptoms occur.  Pt discharged back to [facility] with owner, Chrystie Nose.

## 2016-06-11 ENCOUNTER — Emergency Department
Admission: EM | Admit: 2016-06-11 | Discharge: 2016-10-02 | Disposition: A | Discharge status: Home / Self Care | Payer: MEDICAID | Attending: Emergency Medicine | Admitting: Emergency Medicine

## 2016-06-11 ENCOUNTER — Encounter: Payer: Self-pay | Admitting: *Deleted

## 2016-06-11 DIAGNOSIS — Z7982 Long term (current) use of aspirin: Secondary | ICD-10-CM | POA: Insufficient documentation

## 2016-06-11 DIAGNOSIS — F329 Major depressive disorder, single episode, unspecified: Secondary | ICD-10-CM | POA: Insufficient documentation

## 2016-06-11 DIAGNOSIS — N186 End stage renal disease: Secondary | ICD-10-CM

## 2016-06-11 DIAGNOSIS — Z794 Long term (current) use of insulin: Secondary | ICD-10-CM | POA: Insufficient documentation

## 2016-06-11 DIAGNOSIS — E1122 Type 2 diabetes mellitus with diabetic chronic kidney disease: Secondary | ICD-10-CM | POA: Insufficient documentation

## 2016-06-11 DIAGNOSIS — I10 Essential (primary) hypertension: Secondary | ICD-10-CM | POA: Diagnosis present

## 2016-06-11 DIAGNOSIS — F17210 Nicotine dependence, cigarettes, uncomplicated: Secondary | ICD-10-CM | POA: Insufficient documentation

## 2016-06-11 DIAGNOSIS — F209 Schizophrenia, unspecified: Secondary | ICD-10-CM

## 2016-06-11 DIAGNOSIS — I120 Hypertensive chronic kidney disease with stage 5 chronic kidney disease or end stage renal disease: Secondary | ICD-10-CM | POA: Insufficient documentation

## 2016-06-11 DIAGNOSIS — F4325 Adjustment disorder with mixed disturbance of emotions and conduct: Secondary | ICD-10-CM | POA: Diagnosis present

## 2016-06-11 DIAGNOSIS — Z79899 Other long term (current) drug therapy: Secondary | ICD-10-CM | POA: Insufficient documentation

## 2016-06-11 DIAGNOSIS — E119 Type 2 diabetes mellitus without complications: Secondary | ICD-10-CM

## 2016-06-11 DIAGNOSIS — F259 Schizoaffective disorder, unspecified: Secondary | ICD-10-CM | POA: Insufficient documentation

## 2016-06-11 DIAGNOSIS — F70 Mild intellectual disabilities: Secondary | ICD-10-CM

## 2016-06-11 DIAGNOSIS — Z992 Dependence on renal dialysis: Secondary | ICD-10-CM

## 2016-06-11 DIAGNOSIS — F0280 Dementia in other diseases classified elsewhere without behavioral disturbance: Secondary | ICD-10-CM | POA: Diagnosis present

## 2016-06-11 DIAGNOSIS — E875 Hyperkalemia: Secondary | ICD-10-CM | POA: Diagnosis present

## 2016-06-11 LAB — COMPREHENSIVE METABOLIC PANEL
ALT: 12 U/L — ABNORMAL LOW (ref 14–54)
ANION GAP: 16 — AB (ref 5–15)
AST: 27 U/L (ref 15–41)
Albumin: 3.6 g/dL (ref 3.5–5.0)
Alkaline Phosphatase: 51 U/L (ref 38–126)
BUN: 43 mg/dL — AB (ref 6–20)
CO2: 28 mmol/L (ref 22–32)
CREATININE: 7.1 mg/dL — AB (ref 0.44–1.00)
Calcium: 9.1 mg/dL (ref 8.9–10.3)
Chloride: 95 mmol/L — ABNORMAL LOW (ref 101–111)
GFR, EST AFRICAN AMERICAN: 8 mL/min — AB (ref >60)
GFR, EST NON AFRICAN AMERICAN: 7 mL/min — AB (ref >60)
Glucose, Bld: 207 mg/dL — ABNORMAL HIGH (ref 65–99)
POTASSIUM: 3.4 mmol/L — AB (ref 3.5–5.1)
SODIUM: 139 mmol/L (ref 135–145)
TOTAL PROTEIN: 6.6 g/dL (ref 6.5–8.1)
Total Bilirubin: 0.4 mg/dL (ref 0.3–1.2)

## 2016-06-11 LAB — GLUCOSE, CAPILLARY: Glucose-Capillary: 252 mg/dL — ABNORMAL HIGH (ref 65–99)

## 2016-06-11 LAB — CBC
HCT: 34.9 % — ABNORMAL LOW (ref 35.0–47.0)
Hemoglobin: 11.8 g/dL — ABNORMAL LOW (ref 12.0–16.0)
MCH: 34 pg (ref 26.0–34.0)
MCHC: 33.8 g/dL (ref 32.0–36.0)
MCV: 100.8 fL — AB (ref 80.0–100.0)
PLATELETS: 97 10*3/uL — AB (ref 150–440)
RBC: 3.46 MIL/uL — AB (ref 3.80–5.20)
RDW: 14.4 % (ref 11.5–14.5)
WBC: 5.8 10*3/uL (ref 3.6–11.0)

## 2016-06-11 LAB — ACETAMINOPHEN LEVEL: Acetaminophen (Tylenol), Serum: <10 ug/mL — ABNORMAL LOW (ref 10–30)

## 2016-06-11 LAB — SALICYLATE LEVEL: Salicylate Lvl: <4 mg/dL (ref 2.8–30.0)

## 2016-06-11 LAB — ETHANOL

## 2016-06-11 MED ORDER — ASPIRIN 81 MG PO CHEW
81.0000 mg | CHEWABLE_TABLET | Freq: Every day | ORAL | Status: DC
  Administered 2016-06-11 – 2016-07-08 (×26): 81 mg via ORAL
  Filled 2016-06-11 (×21): qty 1

## 2016-06-11 MED ORDER — CALCIUM ACETATE (PHOS BINDER) 667 MG PO CAPS
1334.0000 mg | ORAL_CAPSULE | Freq: Two times a day (BID) | ORAL | Status: DC
  Filled 2016-06-11: qty 2

## 2016-06-11 MED ORDER — QUETIAPINE FUMARATE 200 MG PO TABS
200.0000 mg | ORAL_TABLET | Freq: Two times a day (BID) | ORAL | Status: DC
  Administered 2016-06-11 – 2016-07-08 (×49): 200 mg via ORAL
  Filled 2016-06-11 (×41): qty 1

## 2016-06-11 MED ORDER — ESCITALOPRAM OXALATE 10 MG PO TABS
15.0000 mg | ORAL_TABLET | Freq: Every day | ORAL | Status: DC
  Administered 2016-06-11 – 2016-07-08 (×26): 15 mg via ORAL
  Filled 2016-06-11: qty 2
  Filled 2016-06-11: qty 1
  Filled 2016-06-11 (×2): qty 2
  Filled 2016-06-11: qty 1
  Filled 2016-06-11 (×5): qty 2
  Filled 2016-06-11: qty 1
  Filled 2016-06-11: qty 2
  Filled 2016-06-11: qty 1
  Filled 2016-06-11 (×3): qty 2
  Filled 2016-06-11 (×3): qty 1
  Filled 2016-06-11 (×5): qty 2

## 2016-06-11 MED ORDER — INSULIN GLARGINE 100 UNIT/ML ~~LOC~~ SOLN
15.0000 [IU] | Freq: Every day | SUBCUTANEOUS | Status: DC
  Administered 2016-06-11: 15 [IU] via SUBCUTANEOUS
  Filled 2016-06-11 (×2): qty 0.15

## 2016-06-11 MED ORDER — DIVALPROEX SODIUM 500 MG PO DR TAB
1000.0000 mg | DELAYED_RELEASE_TABLET | Freq: Every day | ORAL | Status: DC
  Administered 2016-06-11 – 2016-07-07 (×26): 1000 mg via ORAL
  Filled 2016-06-11 (×20): qty 2

## 2016-06-11 MED ORDER — CLONIDINE HCL 0.1 MG PO TABS
0.2000 mg | ORAL_TABLET | Freq: Two times a day (BID) | ORAL | Status: DC
  Administered 2016-06-11 – 2016-06-20 (×17): 0.2 mg via ORAL
  Filled 2016-06-11 (×18): qty 2

## 2016-06-11 MED ORDER — HYDRALAZINE HCL 50 MG PO TABS
50.0000 mg | ORAL_TABLET | Freq: Three times a day (TID) | ORAL | Status: DC
  Administered 2016-06-11 – 2016-06-20 (×22): 50 mg via ORAL
  Filled 2016-06-11 (×31): qty 1

## 2016-06-11 MED ORDER — AMLODIPINE BESYLATE 5 MG PO TABS
10.0000 mg | ORAL_TABLET | Freq: Every day | ORAL | Status: DC
  Administered 2016-06-11: 10 mg via ORAL
  Filled 2016-06-11: qty 2

## 2016-06-11 MED ORDER — TRAZODONE HCL 100 MG PO TABS
100.0000 mg | ORAL_TABLET | ORAL | Status: AC
  Administered 2016-06-12: 100 mg via ORAL
  Filled 2016-06-11: qty 1

## 2016-06-11 NOTE — BH Assessment (Signed)
Assessment Note  Kelly [name] is an 35 y.o. female. Kelly [name] arrived to the ED by way of [place]  from [facility], where she had an appointment earlier today.  Kelly [name] states that she threatened to hurt other people and hurt herself.  She states that "My social worker ain't doing a damn thing".  She states that she has not seen her  and they can't find a place for her.  She states that she is currently staying "at a group home that I don't want to be at".  She reports feeling depressed and anxious.  She reports seeing her baby that died and it is crawling to her and crying "and everything else".  She states that she hears people talking to her telling her to hurt other people.  When asked if she is suicidal she states emphatically "Yeah". When asked if she wants to kill anyone else she states "yes", when questioned who she wants to kill, she states "It don't matter who".  She denied the use of alcohol or drugs. Legal Guardian is Porfirio Mylar [phone]  Diagnosis: Bipolar Disorder  Past Medical History:  Past Medical History  Diagnosis Date  . Chronic kidney disease   . Hypertension   . Bipolar disorder ([place])   . Depression   . Depression   . Elevated lipids   . Cognitive changes   . Alcohol abuse     chronic  . Cognitive impairment   . Staph aureus infection     A/V fistula  . Diabetes mellitus without complication ([place])   . UTI (lower urinary tract infection)   . Hyperparathyroidism [facility])     Past Surgical History  Procedure Laterality Date  . Av fistula placement    . Cholecystectomy    . Dialysis perma cath Right     chest  . Vascular access device insertion Left 04/22/2015    Procedure: INSERTION OF HERO VASCULAR ACCESS DEVICE;  Surgeon: Katha Cabal, MD;  Location: [facility] ORS;  Service: Vascular;  Laterality: Left;  . Peripheral vascular catheterization N/A 05/24/2015    Procedure: Dialysis/Perma Catheter Removal;  Surgeon: Katha Cabal, MD;  Location: [place] CV LAB;  Service: Cardiovascular;  Laterality: N/A;  . Peripheral vascular catheterization Left 02/28/2016    Procedure: A/V Shuntogram/Fistulagram;  Surgeon: Katha Cabal, MD;  Location: [place] CV LAB;  Service: Cardiovascular;  Laterality: Left;  . Peripheral vascular catheterization N/A 02/28/2016    Procedure: A/V Shunt Intervention;  Surgeon: Katha Cabal, MD;  Location: [place] CV LAB;  Service: Cardiovascular;  Laterality: N/A;    Family History: History reviewed. No pertinent family history.  Social History:  reports that she has been smoking Cigarettes.  She has been smoking about 0.25 packs per day for the past 0 years. She uses smokeless tobacco. She reports that she does not drink alcohol or use illicit drugs.  Additional Social History:  Alcohol / Drug Use History of alcohol / drug use?: No history of alcohol / drug abuse  CIWA: CIWA-Ar BP: (!) 203/86 mmHg Pulse Rate: 95 COWS:    Allergies: No Known Allergies  Home Medications:  (Not in a hospital admission)  OB/GYN Status:  No LMP recorded. Patient has had an injection.  General Assessment Data Location of Assessment: [facility] ED TTS Assessment: In system Is this a Tele or Face-to-Face Assessment?: Face-to-Face Is this an Initial Assessment or a Re-assessment for this encounter?: Initial Assessment Marital status: Single Maiden name: n/a  Is patient pregnant?: No Pregnancy Status: No Living Arrangements: Group Home ([facility] [place] [facility] [phone]) Can pt return to current living arrangement?: Yes Admission Status: Involuntary Is patient capable of signing voluntary admission?: No Referral Source: Psychiatrist Insurance type: Medicaid  Medical Screening Exam ([place]) Medical Exam completed: Yes  Crisis Care Plan Living Arrangements: Group Home ([facility] [place] [facility] [phone]) Legal Guardian: Other: Porfirio Mylar) Name of Psychiatrist: [place] Name of Therapist:  [facility]  Education Status Is patient currently in school?: No Current Grade: n/a Highest grade of school patient has completed: 10th Name of school: [employer] person: n/a  Risk to self with the past 6 months Suicidal Ideation: Yes-Currently Present Has patient been a risk to self within the past 6 months prior to admission? : Yes Suicidal Intent: Yes-Currently Present Has patient had any suicidal intent within the past 6 months prior to admission? : Yes Is patient at risk for suicide?: No Suicidal Plan?: Yes-Currently Present Has patient had any suicidal plan within the past 6 months prior to admission? : Yes Specify Current Suicidal Plan: Cut wrist, overdose on pills Access to Means: No (Currently in the hospital) What has been your use of drugs/alcohol within the last 12 months?: denied use Previous Attempts/Gestures: Yes How many times?: 1 Other Self Harm Risks: denied Triggers for Past Attempts: Unknown Intentional Self Injurious Behavior: None (denied) Family Suicide History: No Recent stressful life event(s): Other (Comment) (Unhappy with living situation) Persecutory voices/beliefs?: No Depression: Yes (States she is depressed) Depression Symptoms: Feeling angry/irritable Substance abuse history and/or treatment for substance abuse?: No Suicide prevention information given to non-admitted patients: Not applicable  Risk to Others within the past 6 months Homicidal Ideation: Yes-Currently Present Does patient have any lifetime risk of violence toward others beyond the six months prior to admission? : Yes (comment) Thoughts of Harm to Others: Yes-Currently Present Comment - Thoughts of Harm to Others: States that she wants to kill others Current Homicidal Intent: Yes-Currently Present Current Homicidal Plan: No Access to Homicidal Means: No Identified Victim: Refuses to identify History of harm to others?: No Assessment of Violence: None Noted Violent Behavior  Description: denied by patient Does patient have access to weapons?: No Criminal Charges Pending?: No Does patient have a court date: No Is patient on probation?: No  Psychosis Hallucinations: Auditory, Visual Delusions: None noted  Mental Status Report Appearance/Hygiene: In scrubs Eye Contact: Poor Motor Activity: Restlessness Speech: Loud Level of Consciousness: Alert Mood: Irritable Affect: Inconsistent with thought content Anxiety Level: None Thought Processes: Circumstantial Judgement: Partial Orientation: Person, Place, Situation Obsessive Compulsive Thoughts/Behaviors: None  Cognitive Functioning Concentration: Normal Memory: Recent Intact IQ: Below Average Insight: Poor Impulse Control: Poor Appetite: Good Sleep: No Change Vegetative Symptoms: None  ADLScreening [facility] Assessment Services) Patient's cognitive ability adequate to safely complete daily activities?: Yes Patient able to express need for assistance with ADLs?: Yes Independently performs ADLs?: Yes (appropriate for developmental age)  Prior Inpatient Therapy Prior Inpatient Therapy: No (denied by patient) Prior Therapy Dates: n/a Prior Therapy Facilty/Provider(s): n/a Reason for Treatment: n/a  Prior Outpatient Therapy Prior Outpatient Therapy: Yes Prior Therapy Dates: [facility] Prior Therapy Facilty/Provider(s): Current Reason for Treatment: schizophrenia Does patient have an ACCT team?: No Does patient have Intensive In-House Services?  : No Does patient have [facility] services? : No Does patient have P4CC services?: No  ADL Screening (condition at time of admission) Patient's cognitive ability adequate to safely complete daily activities?: Yes Patient able to express need for assistance  with ADLs?: Yes Independently performs ADLs?: Yes (appropriate for developmental age)       Abuse/Neglect Assessment (Assessment to be complete while patient is alone) Physical Abuse: Denies Verbal Abuse:  Denies Sexual Abuse: Denies Exploitation of patient/patient's resources: Denies Self-Neglect: Denies          Additional Information 1:1 In Past 12 Months?: No CIRT Risk: No Elopement Risk: No Does patient have medical clearance?: Yes     Disposition:  Disposition Initial Assessment Completed for this Encounter: Yes Disposition of Patient: Other dispositions  On Site Evaluation by:   Reviewed with Physician:    Elmer Bales 06/11/2016 11:02 PM

## 2016-06-11 NOTE — ED Notes (Signed)
Pt given peanut butter,crackers and a sprite.

## 2016-06-11 NOTE — BH Specialist Note (Signed)
Kelly [name] currently resides at [facility].  She was given her 30 day notice 5 months ago.  [facility] claims to be working on placement for her.  She is currently a dialysis patient.  She does not want to be at the group home.  She is reported as acting out sexually and has threatened SI and HI - per report from [place]. She received the information from Santiago Glad at [employer].

## 2016-06-11 NOTE — ED Notes (Signed)
Fistula left upper arm

## 2016-06-11 NOTE — ED Notes (Signed)
Pt given meal tray and sprite. 

## 2016-06-11 NOTE — ED Provider Notes (Signed)
[facility] Emergency Department Provider Note  ____________________________________________  Time seen: Approximately 9:25 PM  I have reviewed the triage vital signs and the nursing notes.   HISTORY  Chief Complaint Suicidal and Homicidal    HPI Kelly [name] is a 35 y.o. female 's for evaluation of suicidal thoughts. The patient was seen and referred here by physician Dr. Jamse Arn under involuntary commitment.  The patient reports to me that she's been feeling very depressed about her life, that she is feeling suicidal, that she also sometimes feels like she was wanting to hurt others.  She reports that she had dialysis today. She saw her doctor today who referred her here.  She is a plan to cut her arms, but has taken no action. Denies any overdose or ingestion.  Denies any recent illness, fevers or chills. No nausea or vomiting. Reports she is compliant with her dialysis schedule.   Past Medical History  Diagnosis Date  . Chronic kidney disease   . Hypertension   . Bipolar disorder ([place])   . Depression   . Depression   . Elevated lipids   . Cognitive changes   . Alcohol abuse     chronic  . Cognitive impairment   . Staph aureus infection     A/V fistula  . Diabetes mellitus without complication ([place])   . UTI (lower urinary tract infection)   . Hyperparathyroidism [facility])     Patient Active Problem List   Diagnosis Date Noted  . Adjustment disorder with mixed disturbance of emotions and conduct   . 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 Diabetes ([place]) 04/23/2015  . Essential hypertension 04/23/2015  . ESRD on dialysis ([place]) 04/23/2015  . Pain of left arm 04/22/2015    Past Surgical History  Procedure Laterality Date  . Av fistula placement    . Cholecystectomy    . Dialysis perma cath Right     chest  . Vascular access device insertion Left 04/22/2015    Procedure: INSERTION OF HERO VASCULAR ACCESS DEVICE;  Surgeon:  Katha Cabal, MD;  Location: [facility] ORS;  Service: Vascular;  Laterality: Left;  . Peripheral vascular catheterization N/A 05/24/2015    Procedure: Dialysis/Perma Catheter Removal;  Surgeon: Katha Cabal, MD;  Location: [place] CV LAB;  Service: Cardiovascular;  Laterality: N/A;  . Peripheral vascular catheterization Left 02/28/2016    Procedure: A/V Shuntogram/Fistulagram;  Surgeon: Katha Cabal, MD;  Location: [place] CV LAB;  Service: Cardiovascular;  Laterality: Left;  . Peripheral vascular catheterization N/A 02/28/2016    Procedure: A/V Shunt Intervention;  Surgeon: Katha Cabal, MD;  Location: [place] CV LAB;  Service: Cardiovascular;  Laterality: N/A;    Current Outpatient Rx  Name  Route  Sig  Dispense  Refill  . amLODipine (NORVASC) 10 MG tablet   Oral   Take 10 mg by mouth at bedtime. *hold for systolic blood pressure [id number], call MD if holding this medication.*         . aspirin 81 MG chewable tablet   Oral   Chew 81 mg by mouth daily.         . B Complex-C-Folic Acid (RENA-VITE RX PO)   Oral   Take 1 tablet by mouth daily.         . calcium acetate (PHOSLO) 667 MG capsule   Oral   Take 1,334 mg by mouth 2 (two) times daily with a meal.          .  cinacalcet (SENSIPAR) 60 MG tablet   Oral   Take 60 mg by mouth daily.         . cloNIDine (CATAPRES) 0.2 MG tablet   Oral   Take 0.2 mg by mouth 2 (two) times daily.         . divalproex (DEPAKOTE) 500 MG DR tablet   Oral   Take 1,000 mg by mouth at bedtime.          . docusate sodium (COLACE) 100 MG capsule   Oral   Take 100 mg by mouth at bedtime.          Marland Kitchen escitalopram (LEXAPRO) 10 MG tablet   Oral   Take 15 mg by mouth daily.         . folic acid (FOLVITE) 1 MG tablet   Oral   Take 2 mg by mouth at bedtime.          Marland Kitchen glucagon (GLUCAGEN HYPOKIT) 1 MG SOLR injection   Intramuscular   Inject 1 mg into the muscle once as needed for low blood sugar. For  emergency use blood glucose <75 and not responsive.         . hydrALAZINE (APRESOLINE) 50 MG tablet   Oral   Take 50 mg by mouth 3 (three) times daily.         . insulin aspart (NOVOLOG) 100 UNIT/ML injection   Subcutaneous   Inject 0-12 Units into the skin 3 (three) times daily before meals. Per sliding scale         . insulin glargine (LANTUS) 100 unit/mL SOPN   Subcutaneous   Inject 15 Units into the skin at bedtime.         . lidocaine-prilocaine (EMLA) cream   Topical   Apply 1 application topically 3 (three) times a week. Apply to access site 30 minutes before dialysis.         Marland Kitchen medroxyPROGESTERone (DEPO-PROVERA) 150 MG/ML injection   Intramuscular   Inject 150 mg into the muscle every 3 (three) months.         . metoprolol (LOPRESSOR) 50 MG tablet   Oral   Take 50 mg by mouth 2 (two) times daily.         . Multiple Vitamin (THEREMS PO)   Oral   Take 1 tablet by mouth daily.         . ondansetron (ZOFRAN ODT) 4 MG disintegrating tablet   Oral   Take 1 tablet (4 mg total) by mouth every 8 (eight) hours as needed for nausea or vomiting.   20 tablet   0   . polyethylene glycol (MIRALAX / GLYCOLAX) packet   Oral   Take 17 g by mouth daily as needed for mild constipation, moderate constipation or severe constipation. Mix with 8 oz. of juice.         Marland Kitchen QUEtiapine (SEROQUEL) 200 MG tablet   Oral   Take 200 mg by mouth 2 (two) times daily.          . ranitidine (ZANTAC) 150 MG capsule   Oral   Take 1 capsule (150 mg total) by mouth 2 (two) times daily.   28 capsule   0   . traZODone (DESYREL) 100 MG tablet   Oral   Take 100 mg by mouth at bedtime.         . vitamin B-12 (CYANOCOBALAMIN) 1000 MCG tablet   Oral   Take 1,000 mcg by mouth at bedtime.  Allergies Review of patient's allergies indicates no known allergies.  History reviewed. No pertinent family history.  Social History Social History  Substance Use Topics   . Smoking status: Current Every Day Smoker -- 0.25 packs/day for 0 years    Types: Cigarettes  . Smokeless tobacco: Current User  . Alcohol Use: No    Review of Systems Constitutional: No fever/chills Eyes: No visual changes. ENT: No sore throat. Cardiovascular: Denies chest pain. Respiratory: Denies shortness of breath. Gastrointestinal: No abdominal pain.  No nausea, no vomiting.  No diarrhea.  No constipation. Genitourinary: Negative for dysuria. Musculoskeletal: Negative for back pain. Skin: Negative for rash. Neurological: Negative for headaches, focal weakness or numbness.  10-point ROS otherwise negative.  ____________________________________________   PHYSICAL EXAM:  VITAL SIGNS: ED Triage Vitals  Enc Vitals Group     BP 06/11/16 1857 207/96 mmHg     Pulse Rate 06/11/16 1857 96     Resp 06/11/16 1857 18     Temp 06/11/16 1857 98.6 F (37 C)     Temp Source 06/11/16 1857 Oral     SpO2 06/11/16 1857 100 %     Weight 06/11/16 1857 176 lb (79.833 kg)     Height 06/11/16 1857 5\' 2"  (1.575 m)     Head Cir --      Peak Flow --      Pain Score 06/11/16 1858 8     Pain Loc --      Pain Edu? --      Excl. in [place]? --    Constitutional: Alert and oriented. Well appearing and in no acute distress.Slightly disheveled appearance. Eyes: Conjunctivae are normal. PERRL. EOMI. Head: Atraumatic. Nose: No congestion/rhinnorhea. Mouth/Throat: Mucous membranes are moist.  Oropharynx non-erythematous. Neck: No stridor.   Cardiovascular: Normal rate, regular rhythm. Grossly normal heart sounds.  Good peripheral circulation. Respiratory: Normal respiratory effort.  No retractions. Lungs CTAB. Gastrointestinal: Soft and nontender. No distention. Musculoskeletal: No lower extremity tenderness nor edema. Left upper extremity with strong radial pulse. Fistula noted, normal thrill. Neurologic:  Normal speech and language. No gross focal neurologic deficits are appreciated.Skin:  Skin  is warm, dry and intact. No rash noted. Psychiatric: Mood and affect are flat, slightly agitated when asking her about suicidal thoughts.  ____________________________________________   LABS (all labs ordered are listed, but only abnormal results are displayed)  Labs Reviewed  COMPREHENSIVE METABOLIC PANEL - Abnormal; Notable for the following:    Potassium 3.4 (*)    Chloride 95 (*)    Glucose, Bld 207 (*)    BUN 43 (*)    Creatinine, Ser 7.10 (*)    ALT 12 (*)    GFR calc non Af Amer 7 (*)    GFR calc Af Amer 8 (*)    Anion gap 16 (*)    All other components within normal limits  ACETAMINOPHEN LEVEL - Abnormal; Notable for the following:    Acetaminophen (Tylenol), Serum <10 (*)    All other components within normal limits  CBC - Abnormal; Notable for the following:    RBC 3.46 (*)    Hemoglobin 11.8 (*)    HCT 34.9 (*)    MCV 100.8 (*)    Platelets 97 (*)    All other components within normal limits  GLUCOSE, CAPILLARY - Abnormal; Notable for the following:    Glucose-Capillary 252 (*)    All other components within normal limits  ETHANOL  SALICYLATE LEVEL  CBG MONITORING, ED  CBG MONITORING,  ED   ____________________________________________  EKG   ____________________________________________  RADIOLOGY   ____________________________________________   PROCEDURES  Procedure(s) performed: None  Critical Care performed: No  ____________________________________________   INITIAL IMPRESSION / ASSESSMENT AND PLAN / ED COURSE  Pertinent labs & imaging results that were available during my care of the patient were reviewed by me and considered in my medical decision making (see chart for details).  Patient presents for evaluation of suicidal thoughts, under involuntary commitment. Clinically she appears stable with no acute medical complaint, though she is notably a dialysis patient. She had dialysis today, her labs appear stable and were discussed with Dr.  Candiss Norse of nephrology. Nephrology will plan on seeing her in consult tomorrow, recommend no acute intervention tonight  Patient is felt medically stable for psychiatric evaluation with nephrology consult team following.  Ongoing care including follow-up with psychiatry in overnight care assigned to Dr. Lovena Le ____________________________________________   FINAL CLINICAL IMPRESSION(S) / ED DIAGNOSES  Final diagnoses:  Schizophrenia, unspecified type ([place])  End stage renal disease ([facility])      Delman Kitten, MD 06/11/16 2310

## 2016-06-11 NOTE — ED Notes (Signed)
Arrives in [employer] custody, IVC papers, states she wants to hurt herself by cutting her arm open and wants to hurt any body else that gets in her way, pt picked up from [place], dialysis pt, MWF

## 2016-06-12 DIAGNOSIS — F4325 Adjustment disorder with mixed disturbance of emotions and conduct: Secondary | ICD-10-CM | POA: Diagnosis not present

## 2016-06-12 LAB — BASIC METABOLIC PANEL
Anion gap: 12 (ref 5–15)
BUN: 44 mg/dL — ABNORMAL HIGH (ref 6–20)
CO2: 32 mmol/L (ref 22–32)
Calcium: 9.4 mg/dL (ref 8.9–10.3)
Chloride: 97 mmol/L — ABNORMAL LOW (ref 101–111)
Creatinine, Ser: 7.79 mg/dL — ABNORMAL HIGH (ref 0.44–1.00)
GFR calc Af Amer: 7 mL/min — ABNORMAL LOW (ref >60)
GFR calc non Af Amer: 6 mL/min — ABNORMAL LOW (ref >60)
Glucose, Bld: 32 mg/dL — CL (ref 65–99)
Potassium: 2.6 mmol/L — CL (ref 3.5–5.1)
Sodium: 141 mmol/L (ref 135–145)

## 2016-06-12 LAB — GLUCOSE, CAPILLARY
GLUCOSE-CAPILLARY: 28 mg/dL — AB (ref 65–99)
GLUCOSE-CAPILLARY: 164 mg/dL — AB (ref 65–99)
GLUCOSE-CAPILLARY: 105 mg/dL — AB (ref 65–99)
Glucose-Capillary: 37 mg/dL — CL (ref 65–99)
Glucose-Capillary: 41 mg/dL — CL (ref 65–99)
Glucose-Capillary: 67 mg/dL (ref 65–99)
Glucose-Capillary: 241 mg/dL — ABNORMAL HIGH (ref 65–99)

## 2016-06-12 LAB — VALPROIC ACID LEVEL: Valproic Acid Lvl: 80 ug/mL (ref 50.0–100.0)

## 2016-06-12 MED ORDER — INSULIN ASPART 100 UNIT/ML ~~LOC~~ SOLN
0.0000 [IU] | Freq: Every day | SUBCUTANEOUS | Status: DC
  Administered 2016-06-12: 2 [IU] via SUBCUTANEOUS
  Administered 2016-06-19: 3 [IU] via SUBCUTANEOUS
  Administered 2016-06-22 – 2016-07-03 (×3): 2 [IU] via SUBCUTANEOUS
  Administered 2016-07-07: 5 [IU] via SUBCUTANEOUS
  Filled 2016-06-12: qty 5
  Filled 2016-06-12: qty 2
  Filled 2016-06-12: qty 3
  Filled 2016-06-12 (×3): qty 2

## 2016-06-12 MED ORDER — TRAZODONE HCL 100 MG PO TABS
100.0000 mg | ORAL_TABLET | Freq: Every day | ORAL | Status: DC
  Administered 2016-06-12 – 2016-07-07 (×25): 100 mg via ORAL
  Filled 2016-06-12 (×19): qty 1

## 2016-06-12 MED ORDER — POTASSIUM CHLORIDE CRYS ER 20 MEQ PO TBCR
40.0000 meq | EXTENDED_RELEASE_TABLET | Freq: Once | ORAL | Status: DC
  Filled 2016-06-12: qty 2

## 2016-06-12 MED ORDER — AMLODIPINE BESYLATE 5 MG PO TABS
10.0000 mg | ORAL_TABLET | Freq: Once | ORAL | Status: AC
  Administered 2016-06-12: 10 mg via ORAL
  Filled 2016-06-12: qty 2

## 2016-06-12 MED ORDER — POTASSIUM CHLORIDE CRYS ER 20 MEQ PO TBCR
40.0000 meq | EXTENDED_RELEASE_TABLET | Freq: Two times a day (BID) | ORAL | Status: DC
  Administered 2016-06-12 – 2016-06-13 (×3): 40 meq via ORAL
  Filled 2016-06-12 (×3): qty 2

## 2016-06-12 MED ORDER — INSULIN ASPART 100 UNIT/ML ~~LOC~~ SOLN
0.0000 [IU] | Freq: Three times a day (TID) | SUBCUTANEOUS | Status: DC
  Administered 2016-06-12: 3 [IU] via SUBCUTANEOUS
  Administered 2016-06-13: 7 [IU] via SUBCUTANEOUS
  Administered 2016-06-14: 3 [IU] via SUBCUTANEOUS
  Administered 2016-06-14: 2 [IU] via SUBCUTANEOUS
  Administered 2016-06-14: 3 [IU] via SUBCUTANEOUS
  Administered 2016-06-15: 2 [IU] via SUBCUTANEOUS
  Administered 2016-06-15: 3 [IU] via SUBCUTANEOUS
  Administered 2016-06-16: 2 [IU] via SUBCUTANEOUS
  Administered 2016-06-16: 3 [IU] via SUBCUTANEOUS
  Administered 2016-06-16 – 2016-06-17 (×2): 2 [IU] via SUBCUTANEOUS
  Administered 2016-06-17: 3 [IU] via SUBCUTANEOUS
  Administered 2016-06-18: 8 [IU] via SUBCUTANEOUS
  Administered 2016-06-19 (×2): 3 [IU] via SUBCUTANEOUS
  Administered 2016-06-21: 2 [IU] via SUBCUTANEOUS
  Administered 2016-06-21 – 2016-06-22 (×2): 3 [IU] via SUBCUTANEOUS
  Administered 2016-06-23: 2 [IU] via SUBCUTANEOUS
  Administered 2016-06-23: 3 [IU] via SUBCUTANEOUS
  Administered 2016-06-23: 2 [IU] via SUBCUTANEOUS
  Administered 2016-06-24: 3 [IU] via SUBCUTANEOUS
  Administered 2016-06-24: 2 [IU] via SUBCUTANEOUS
  Administered 2016-06-25 (×2): 3 [IU] via SUBCUTANEOUS
  Administered 2016-06-27: 2 [IU] via SUBCUTANEOUS
  Administered 2016-06-27: 3 [IU] via SUBCUTANEOUS
  Administered 2016-06-28: 5 [IU] via SUBCUTANEOUS
  Administered 2016-06-29: 8 [IU] via SUBCUTANEOUS
  Administered 2016-06-29: 2 [IU] via SUBCUTANEOUS
  Administered 2016-06-30 (×3): 3 [IU] via SUBCUTANEOUS
  Administered 2016-07-01: 2 [IU] via SUBCUTANEOUS
  Administered 2016-07-01: 5 [IU] via SUBCUTANEOUS
  Administered 2016-07-02: 2 [IU] via SUBCUTANEOUS
  Administered 2016-07-02: 8 [IU] via SUBCUTANEOUS
  Administered 2016-07-03 – 2016-07-05 (×4): 3 [IU] via SUBCUTANEOUS
  Administered 2016-07-06: 5 [IU] via SUBCUTANEOUS
  Administered 2016-07-06: 11 [IU] via SUBCUTANEOUS
  Administered 2016-07-07: 2 [IU] via SUBCUTANEOUS
  Administered 2016-07-07: 3 [IU] via SUBCUTANEOUS
  Administered 2016-07-08: 11 [IU] via SUBCUTANEOUS
  Filled 2016-06-12: qty 2
  Filled 2016-06-12 (×4): qty 3
  Filled 2016-06-12: qty 2
  Filled 2016-06-12: qty 1
  Filled 2016-06-12 (×2): qty 3
  Filled 2016-06-12: qty 2
  Filled 2016-06-12: qty 10
  Filled 2016-06-12: qty 3
  Filled 2016-06-12: qty 8
  Filled 2016-06-12: qty 3
  Filled 2016-06-12: qty 2
  Filled 2016-06-12: qty 3
  Filled 2016-06-12 (×3): qty 2
  Filled 2016-06-12: qty 3
  Filled 2016-06-12: qty 7
  Filled 2016-06-12: qty 2
  Filled 2016-06-12: qty 3
  Filled 2016-06-12: qty 2
  Filled 2016-06-12: qty 5
  Filled 2016-06-12: qty 3
  Filled 2016-06-12: qty 8
  Filled 2016-06-12: qty 5
  Filled 2016-06-12: qty 3
  Filled 2016-06-12 (×2): qty 2
  Filled 2016-06-12 (×3): qty 3
  Filled 2016-06-12: qty 8
  Filled 2016-06-12: qty 2
  Filled 2016-06-12: qty 10
  Filled 2016-06-12 (×2): qty 3

## 2016-06-12 MED ORDER — METOPROLOL TARTRATE 50 MG PO TABS
50.0000 mg | ORAL_TABLET | Freq: Two times a day (BID) | ORAL | Status: DC
  Administered 2016-06-12 – 2016-07-08 (×48): 50 mg via ORAL
  Filled 2016-06-12 (×36): qty 1

## 2016-06-12 NOTE — ED Notes (Addendum)
Recheck CBG 37.  Pt refused to drink anymore orange juice. Lovena Le MD made aware.

## 2016-06-12 NOTE — ED Notes (Signed)
Pts CBG 28 MD Lovena Le MD aware. Pt also given another OJ 8oz.

## 2016-06-12 NOTE — ED Notes (Signed)
Due to pt med list not current, EDP Dr. Darl Householder request that medications be held until up to date list obtained.
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
## 2016-06-12 NOTE — Progress Notes (Signed)
Inpatient Diabetes Program Recommendations  AACE/ADA: New Consensus Statement on Inpatient Glycemic Control (2015)  Target Ranges:  Prepandial:   less than 140 mg/dL      Peak postprandial:   less than 180 mg/dL (1-2 hours)      Critically ill patients:  140 - 180 mg/dL    Review of Glycemic ControlResults for Kelly [name], Kelly [name] (MRN [id number]) as of 06/12/2016 08:34  Ref. Range 06/11/2016 19:42 06/12/2016 06:55 06/12/2016 06:56 06/12/2016 07:19 06/12/2016 07:58  Glucose-Capillary Latest Ref Range: 65-99 mg/dL 252 (H) 28 (LL) 37 (LL) 41 (LL) 67   Diabetes history: Diabetes (ESRD on dialysis) Outpatient Diabetes medications: Lantus 15 units q HS, Novolog 0-12 units tid with meals (correction scale) Current orders for Inpatient glycemic control:  Lantus 15 units q HS  Inpatient Diabetes Program Recommendations:    Note that blood sugar low this morning.  Please consider reduction of Lantus to 10 units q HS.  Also consider adding Novolog sensitive correction tid with meals per "Glycemic control order set".  This also includes "hypoglycemia protocol" and orders.  Thanks, Adah Perl, RN, BC-ADM Inpatient Diabetes Coordinator Pager [phone] (8a-5p)

## 2016-06-12 NOTE — ED Notes (Signed)
Pt eating apple sauce and potato chips, drinking orange juice. Will recheck FS in 68min.

## 2016-06-12 NOTE — ED Provider Notes (Signed)
Progress Note  Pt's glucose this morning was 32 after [place] 15 units 11:30 pm last night, and potassium dropped to 2.6 from 3.4.  Pt was given juice, saltines and peanut butter, food tray ordered, and 42meq of potassium given.  Pt will be followed closely this morning and signed out to Dr. Darl Householder.    Ruby Cola, MD 06/12/16 [phone]
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
## 2016-06-12 NOTE — ED Notes (Signed)
Pt given water to drink. Pt denies any pain.

## 2016-06-12 NOTE — ED Notes (Addendum)
Called to [facility] [phone]. They are going to fax medication history to [place].

## 2016-06-12 NOTE — ED Notes (Signed)
Pt given a saltine crackers and orange juice 8OZ will recheck surgar. Potassium being ordered.

## 2016-06-12 NOTE — ED Provider Notes (Addendum)
  Physical Exam  BP 171/92 mmHg  Pulse 106  Temp(Src) 98.3 F (36.8 C) (Oral)  Resp 20  Ht 5\' 2"  (1.575 m)  Wt 176 lb (79.833 kg)  BMI 32.18 kg/m2  SpO2 96%  Physical Exam  ED Course  Procedures  MDM Patient's CBG was low this morning. Ate some food and drank juice. CBG increased to 67 and now 164. She was given lantus overnight but lantus was not part of her current home med so I dc it. K 2.6 this AM and given potassium. Had dialysis yesterday so doesn't have acute need for dialysis. Dr. Weber Cooks saw patient and rescinded the IVC. Will dc back to facility.  1:24 PM Upon discharge, nurse informed me that group home has discharged patient from the group home recently. Will consult social work to assist for placement.    Drenda Freeze, MD 06/12/16 1312  Drenda Freeze, MD 06/12/16 1325

## 2016-06-12 NOTE — ED Notes (Signed)
Pt given a sandwich tray to eat.

## 2016-06-12 NOTE — NC FL2 (Addendum)
[place] LEVEL OF CARE SCREENING TOOL     IDENTIFICATION  Patient Name: Kelly [name] Birthdate: 01/16/1981 Sex: female Admission Date (Current Location): 06/11/2016  [place] and [place] Number:  Selena Lesser [id number] [place] and Address:  [facility], [address] [place], [place] [address]      Provider Number: [id number]  Attending Physician Name and Address:  Hinda Kehr, MD  Relative Name and Phone Number:   [place] ( Legal Guardian) [phone]  Current Level of Care: Hospital Recommended Level of Care: [place] Prior Approval Number:    Date Approved/Denied:   PASRR Number:    Discharge Plan: Domiciliary (Rest home)    Current Diagnoses: Patient Active Problem List   Diagnosis Date Noted  . Adjustment disorder with mixed disturbance of emotions and conduct   . 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 Diabetes ([place]) 04/23/2015  . Essential hypertension 04/23/2015  . ESRD on dialysis ([place]) 04/23/2015  . Pain of left arm 04/22/2015    Orientation RESPIRATION BLADDER Height & Weight     Self, Time, Situation, Place  Normal Continent Weight: 176 lb (79.833 kg) Height:  5\' 2"  (157.5 cm)  BEHAVIORAL SYMPTOMS/MOOD NEUROLOGICAL BOWEL NUTRITION STATUS   Has made threats to harm self and others, acts out when agitated ( yelling,cussing) Has the ability to redirect her anger when supported.  (None) Continent Diet (Heart healthy)  Renal  AMBULATORY STATUS COMMUNICATION OF NEEDS Skin   Independent Verbally Normal                       Personal Care Assistance Level of Assistance  Bathing, Feeding, Dressing Bathing Assistance: Independent Feeding assistance: Independent Dressing Assistance: Independent     Functional Limitations Info  Sight, Hearing, Speech Sight Info: Adequate Hearing Info: Adequate Speech Info: Adequate    SPECIAL CARE FACTORS FREQUENCY   [facility]  patient    3x week requires supervision at the [facility]                 Contractures      Additional Factors Info  Code Status, Allergies Code Status Info: Full Code Allergies Info: No known allergies            Current Medications (06/12/2016):  This is the current hospital active medication list Current Facility-Administered Medications  Medication Dose Route Frequency Provider Last Rate Last Dose  . aspirin chewable tablet 81 mg  81 mg Oral Daily Delman Kitten, MD   81 mg at 06/12/16 1050  . cloNIDine (CATAPRES) tablet 0.2 mg  0.2 mg Oral BID Delman Kitten, MD   0.2 mg at 06/12/16 1051  . divalproex (DEPAKOTE) DR tablet 1,000 mg  1,000 mg Oral QHS Delman Kitten, MD   1,000 mg at 06/11/16 2338  . escitalopram (LEXAPRO) tablet 15 mg  15 mg Oral Daily Delman Kitten, MD   15 mg at 06/12/16 1050  . hydrALAZINE (APRESOLINE) tablet 50 mg  50 mg Oral TID Delman Kitten, MD   50 mg at 06/12/16 1051  . insulin aspart (novoLOG) injection 0-15 Units  0-15 Units Subcutaneous TID WC Drenda Freeze, MD   3 Units at 06/12/16 1235  . insulin aspart (novoLOG) injection 0-5 Units  0-5 Units Subcutaneous QHS Drenda Freeze, MD      . metoprolol (LOPRESSOR) tablet 50 mg  50 mg Oral BID Drenda Freeze, MD   50 mg at 06/12/16 1049  .  potassium chloride SA (K-DUR,KLOR-CON) CR tablet 40 mEq  40 mEq Oral BID Ruby Cola, MD   40 mEq at 06/12/16 1050  . QUEtiapine (SEROQUEL) tablet 200 mg  200 mg Oral BID Delman Kitten, MD   200 mg at 06/12/16 1051  . traZODone (DESYREL) tablet 100 mg  100 mg Oral QHS Drenda Freeze, MD       Current Outpatient Prescriptions  Medication Sig Dispense Refill  . amLODipine (NORVASC) 10 MG tablet Take 10 mg by mouth at bedtime. *hold for systolic blood pressure [phone], call MD if holding this medication.*    . aspirin 81 MG chewable tablet Chew 81 mg by mouth daily.    . B Complex-C-Folic Acid (RENA-VITE RX PO) Take 1 tablet by mouth daily.    . calcium acetate (PHOSLO)  667 MG capsule Take 1,334 mg by mouth 2 (two) times daily with a meal.     . cinacalcet (SENSIPAR) 60 MG tablet Take 60 mg by mouth daily.    . cloNIDine (CATAPRES) 0.2 MG tablet Take 0.2 mg by mouth 2 (two) times daily.    . divalproex (DEPAKOTE) 500 MG DR tablet Take 1,000 mg by mouth at bedtime.     . docusate sodium (COLACE) 100 MG capsule Take 100 mg by mouth at bedtime.     Marland Kitchen escitalopram (LEXAPRO) 10 MG tablet Take 15 mg by mouth daily.    . folic acid (FOLVITE) 1 MG tablet Take 2 mg by mouth at bedtime.     . insulin aspart (NOVOLOG) 100 UNIT/ML injection Inject 0-12 Units into the skin 3 (three) times daily before meals. Per sliding scale    . lidocaine-prilocaine (EMLA) cream Apply 1 application topically 3 (three) times a week. Apply to access site 30 minutes before dialysis.    Marland Kitchen medroxyPROGESTERone (DEPO-PROVERA) 150 MG/ML injection Inject 150 mg into the muscle every 3 (three) months.    . metoprolol (LOPRESSOR) 50 MG tablet Take 50 mg by mouth 2 (two) times daily.    . Multiple Vitamin (THEREMS PO) Take 1 tablet by mouth daily.    . QUEtiapine (SEROQUEL) 200 MG tablet Take 200 mg by mouth 2 (two) times daily.     . traZODone (DESYREL) 100 MG tablet Take 100 mg by mouth at bedtime.    . vitamin B-12 (CYANOCOBALAMIN) 1000 MCG tablet Take 1,000 mcg by mouth at bedtime.        Discharge Medications: Please see discharge summary for a list of discharge medications.  Relevant Imaging Results:  Relevant Lab Results:   Additional Information SSN [id number]  Kelly [name], LCSWA

## 2016-06-12 NOTE — Progress Notes (Signed)
CSW has sent referrals to several group homes and has been unable to place pt. Pt is difficult to place because she was discharged from her previous group home for aggressive behavior. All group homes that [place] has contacted have declined pt. CSW will continue to try to reach out to other facilities and see if they will accept pt.  [name], MSW, [place]

## 2016-06-12 NOTE — ED Notes (Signed)
Pt has eaten her breakfast meal and requesting more, pt given cheerios.. Will continue to monitor the pt.

## 2016-06-12 NOTE — Progress Notes (Signed)
[facility] Manager Isla Pence [phone] has an available bed and may be willing to accept pt. CSW is waiting for a call back.  [name], MSW, [place]

## 2016-06-12 NOTE — Discharge Instructions (Addendum)
Take your meds as prescribed.   See your doctor and psychiatrist.   Your potassium is slightly low, please mention that during dialysis tomorrow.  See your doctor and psychiatrist   Return to ER if you have thoughts of harming yourself or others, hallucinations.

## 2016-06-12 NOTE — ED Notes (Signed)
Called [facility] at [phone] and requested to have medication list faxed.. States that they would do it immediately.Marland Kitchen

## 2016-06-13 LAB — POTASSIUM: Potassium: 5.5 mmol/L — ABNORMAL HIGH (ref 3.5–5.1)

## 2016-06-13 LAB — BASIC METABOLIC PANEL
ANION GAP: 12 (ref 5–15)
BUN: 62 mg/dL — AB (ref 6–20)
CO2: 28 mmol/L (ref 22–32)
Calcium: 9.2 mg/dL (ref 8.9–10.3)
Chloride: 95 mmol/L — ABNORMAL LOW (ref 101–111)
Creatinine, Ser: 9.43 mg/dL — ABNORMAL HIGH (ref 0.44–1.00)
GFR calc Af Amer: 6 mL/min — ABNORMAL LOW (ref >60)
GFR calc non Af Amer: 5 mL/min — ABNORMAL LOW (ref >60)
GLUCOSE: 264 mg/dL — AB (ref 65–99)
POTASSIUM: 6.1 mmol/L — AB (ref 3.5–5.1)
Sodium: 135 mmol/L (ref 135–145)

## 2016-06-13 LAB — GLUCOSE, CAPILLARY
GLUCOSE-CAPILLARY: 219 mg/dL — AB (ref 65–99)
GLUCOSE-CAPILLARY: 284 mg/dL — AB (ref 65–99)
GLUCOSE-CAPILLARY: 156 mg/dL — AB (ref 65–99)
Glucose-Capillary: 106 mg/dL — ABNORMAL HIGH (ref 65–99)
Glucose-Capillary: 269 mg/dL — ABNORMAL HIGH (ref 65–99)

## 2016-06-13 NOTE — Progress Notes (Signed)
Spoke with Dr. Cinda Quest about K of 6.1. Pt is also on KCL po 40MEQ BID. Pt for HD currently. Per Dr. Cinda Quest pt missed a HD session. States to continue supplement. Ok to recheck K level after HD.  Ramond Dial, Pharm.D Clinical Pharmacist

## 2016-06-13 NOTE — Progress Notes (Signed)
Post hd tx 

## 2016-06-13 NOTE — ED Notes (Signed)
Verbal telephone consent received from Kelly [name] for hemodialysis. Pt to dialysis unit with medic.

## 2016-06-13 NOTE — Progress Notes (Signed)
Hemodialysis completed. 
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
## 2016-06-13 NOTE — ED Provider Notes (Signed)
-----------------------------------------   8:46 AM on 06/13/2016 -----------------------------------------   Blood pressure 172/109, pulse 98, temperature 98.3 F (36.8 C), temperature source Oral, resp. rate 16, height 5\' 2"  (1.575 m), weight 176 lb (79.833 kg), SpO2 97 %.  The patient had no acute events since last update.  Calm and cooperative at this time.  Disposition is pending per social work team recommendations. Nephrology will see patient and regular dialysis while she is in the ER.     Nena Polio, MD 06/13/16 [phone]

## 2016-06-13 NOTE — ED Notes (Signed)
Last CBG 7/19 0900, next will be 1300.  Orders clicked off as repeated for sidebar cleanup

## 2016-06-13 NOTE — Progress Notes (Signed)
Subjective:   Patient known to our practice from outpatient Presented tp ER with suicidal thoughts. Has been evaluated by psychiatrist Nephrology consult requested to arrange for HD  At present patient denies acute c/o No SOB No N/V Asking for food  Patient seen during dialysis Tolerating well    HEMODIALYSIS FLOWSHEET:  Blood Flow Rate (mL/min): 400 mL/min Arterial Pressure (mmHg): -140 mmHg Venous Pressure (mmHg): 220 mmHg Transmembrane Pressure (mmHg): 40 mmHg Ultrafiltration Rate (mL/min): 430 mL/min Dialysate Flow Rate (mL/min): 800 ml/min Conductivity: Machine : 14.2 Conductivity: Machine : 14.2 Dialysis Fluid Bolus: Normal Saline Bolus Amount (mL): 250 mL Intra-Hemodialysis Comments: 976    Objective:  Vital signs in last 24 hours:  Temp:  [98.1 F (36.7 C)-98.3 F (36.8 C)] 98.1 F (36.7 C) (07/19 1030) Pulse Rate:  [78-98] 78 (07/19 1300) Resp:  [15-16] 15 (07/19 1300) BP: (151-181)/(78-109) 175/81 mmHg (07/19 1300) SpO2:  [97 %-100 %] 100 % (07/19 1300) Weight:  [79.833 kg (176 lb)] 79.833 kg (176 lb) (07/19 1030)  Weight change:  Filed Weights   06/11/16 1857 06/13/16 1030  Weight: 79.833 kg (176 lb) 79.833 kg (176 lb)    Intake/Output:   No intake or output data in the 24 hours ending 06/13/16 1334   Physical Exam: General: NAD, laying in bed  HEENT anicteric  Neck supple  Pulm/lungs Clear b/l  CVS/Heart regular  Abdomen:  Soft, NT  Extremities: No edema  Neurologic: Alert, able to answer questions  Skin: No acute rashes  Access: Left arm AV access       Basic Metabolic Panel:   Recent Labs Lab 06/11/16 1900 06/12/16 0600 06/13/16 1059  NA 139 141 135  K 3.4* 2.6* 6.1*  CL 95* 97* 95*  CO2 28 32 28  GLUCOSE 207* 32* 264*  BUN 43* 44* 62*  CREATININE 7.10* 7.79* 9.43*  CALCIUM 9.1 9.4 9.2     CBC:  Recent Labs Lab 06/11/16 1900  WBC 5.8  HGB 11.8*  HCT 34.9*  MCV 100.8*  PLT 97*      Microbiology:  No  results found for this or any previous visit (from the past 720 hour(s)).  Coagulation Studies: No results for input(s): LABPROT, INR in the last 72 hours.  Urinalysis: No results for input(s): COLORURINE, LABSPEC, PHURINE, GLUCOSEU, HGBUR, BILIRUBINUR, KETONESUR, PROTEINUR, UROBILINOGEN, NITRITE, LEUKOCYTESUR in the last 72 hours.  Invalid input(s): APPERANCEUR    Imaging: No results found.   Medications:     . aspirin  81 mg Oral Daily  . cloNIDine  0.2 mg Oral BID  . divalproex  1,000 mg Oral QHS  . escitalopram  15 mg Oral Daily  . hydrALAZINE  50 mg Oral TID  . insulin aspart  0-15 Units Subcutaneous TID WC  . insulin aspart  0-5 Units Subcutaneous QHS  . metoprolol tartrate  50 mg Oral BID  . potassium chloride  40 mEq Oral BID  . QUEtiapine  200 mg Oral BID  . traZODone  100 mg Oral QHS     Assessment/ Plan:  35 y.o. female with pmhx of ESRD, AOCD, SHPTH, HTN, hx of AVF, complex mood disorder, mild cognitive impairment, diabetes mellitus, hx of recurrent staph bacteremia, LUE AVG placed 04/22/15.  1. ESRD on HD MWF/ [address] [facility]: Patient seen during dialysis Tolerating well   2. Anemia of CKD:  Hgb 11.8 EPO if Hgb <11  3. SHPTH:  will continue to monitor phos   4. Hyperkalemia - discontinue Kcl supplements  LOS:  Murlean Iba 7/19/20171:34 PM

## 2016-06-13 NOTE — ED Notes (Addendum)
Pt stated that she did not want pizza from food tray, pt given sandwich tray.  Pt ate chips from tray and did not eat sandwich.  Explained to pt that she would not be given more than one meal tray, pt stated that she did not want to keep tray.

## 2016-06-13 NOTE — Progress Notes (Signed)
Pre-hd tx 

## 2016-06-13 NOTE — ED Notes (Signed)
Pt. transfered to [facility] without incident after report from [place]. Placed in room BHU 5 and oriented to unit. Pt. informed that for their safety all care areas are designed for safety and monitored by security cameras at all times; and visiting hours explained to patient. Patient verbalizes understanding, and verbal contract for safety obtained. Pt denies any HI or SI at this time has no complaints of pain or discomfort. Pt awaiting social consult for placement.

## 2016-06-13 NOTE — ED Notes (Signed)
PT  VOL   PENDING  PLACEMENT  PT  SEEN  BY  DR  CLAPACS  ON 06/13/16  PT  DOES  NOT MEET CRITERIA  FOR  IN PT  ADMISSION  [facility]  PAPERS  RESCINDED  ON  06/12/16  BY  DR  [employer]
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
## 2016-06-13 NOTE — ED Notes (Signed)
Pt woke asking for another dinner tray.  Pt given cereal.

## 2016-06-13 NOTE — ED Notes (Signed)
Attempted to call Legal Guardian for consent for hemodialysis. HIPAA compliant message left with return number for call back.

## 2016-06-13 NOTE — Progress Notes (Signed)
Hemodialysis start 

## 2016-06-14 LAB — GLUCOSE, CAPILLARY
GLUCOSE-CAPILLARY: 155 mg/dL — AB (ref 65–99)
Glucose-Capillary: 151 mg/dL — ABNORMAL HIGH (ref 65–99)
Glucose-Capillary: 135 mg/dL — ABNORMAL HIGH (ref 65–99)
Glucose-Capillary: 182 mg/dL — ABNORMAL HIGH (ref 65–99)

## 2016-06-14 LAB — HEPATITIS B SURFACE ANTIGEN: Hepatitis B Surface Ag: NEGATIVE
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
## 2016-06-14 NOTE — ED Notes (Signed)
Pt given meal tray.

## 2016-06-14 NOTE — ED Notes (Signed)
CBG 155 post meal

## 2016-06-14 NOTE — Progress Notes (Addendum)
Inpatient Diabetes Program Recommendations  AACE/ADA: New Consensus Statement on Inpatient Glycemic Control (2015)  Target Ranges:  Prepandial:   less than 140 mg/dL      Peak postprandial:   less than 180 mg/dL (1-2 hours)      Critically ill patients:  140 - 180 mg/dL   Lab Results  Component Value Date   GLUCAP 156* 06/13/2016   HGBA1C 5.7 10/07/2012    Review of Glycemic Control  Results for BERNELL, ZUPKO (MRN [id number]) as of 06/14/2016 10:10  Ref. Range 06/13/2016 15:47 06/13/2016 17:11 06/13/2016 18:58 06/13/2016 21:38 06/14/2016 08:50  Glucose-Capillary Latest Ref Range: 65-99 mg/dL 219 (H) 284 (H) 269 (H) 156 (H) 151 (H)    Diabetes history: Type 2 Outpatient Diabetes medications: Novolog 0-12 units tid Current orders for Inpatient glycemic control: Novolog 0-15 units tid, Novolog 0-5 units qhs  Inpatient Diabetes Program Recommendations: Consider restarting Lantus but at 5 units qhs.  Per ADA recommendations "consider performing an A1C on all patients with diabetes or hypreglycemia admitted to the hospital if not performed in the prior 3 months"- if appropriate  Gentry Fitz, RN, [place], [place], CDE Diabetes Coordinator Inpatient Diabetes Program  [phone] (Team Pager) [phone] ([facility]) 06/14/2016 10:12 AM

## 2016-06-14 NOTE — ED Provider Notes (Signed)
-----------------------------------------   12:36 AM on 06/14/2016 -----------------------------------------   Blood pressure 171/95, pulse 97, temperature 98.9 F (37.2 C), temperature source Oral, resp. rate 16, height 5\' 2"  (1.575 m), weight 173 lb 11.6 oz (78.8 kg), SpO2 100 %.  The patient had no acute events since last update.  Calm and cooperative at this time.  Disposition is pending per Psychiatry/Behavioral Medicine team recommendations.     Orbie Pyo, MD 06/14/16 [phone]

## 2016-06-14 NOTE — ED Notes (Signed)
POC CBG result at 2157: 182 mg/dL

## 2016-06-14 NOTE — ED Notes (Signed)
Pt. Repeatedly out of room asking for food. Pt. Given crackers and peanut butter after dinner, continuing to exit room asking for snacks.

## 2016-06-14 NOTE — Progress Notes (Signed)
CSW contacted supervisor, [facility], for guidance on where to place pt. Left a message, awaiting call back.  Kelly [name], MSW, [place]

## 2016-06-15 LAB — CBC
HEMATOCRIT: 30.4 % — AB (ref 35.0–47.0)
Hemoglobin: 10.3 g/dL — ABNORMAL LOW (ref 12.0–16.0)
MCH: 34 pg (ref 26.0–34.0)
MCHC: 34 g/dL (ref 32.0–36.0)
MCV: 99.9 fL (ref 80.0–100.0)
Platelets: 83 10*3/uL — ABNORMAL LOW (ref 150–440)
RBC: 3.04 MIL/uL — ABNORMAL LOW (ref 3.80–5.20)
RDW: 14.1 % (ref 11.5–14.5)
WBC: 4.5 10*3/uL (ref 3.6–11.0)

## 2016-06-15 LAB — GLUCOSE, CAPILLARY
Glucose-Capillary: 127 mg/dL — ABNORMAL HIGH (ref 65–99)
Glucose-Capillary: 177 mg/dL — ABNORMAL HIGH (ref 65–99)
Glucose-Capillary: 194 mg/dL — ABNORMAL HIGH (ref 65–99)

## 2016-06-15 LAB — RENAL FUNCTION PANEL
ANION GAP: 10 (ref 5–15)
Albumin: 3.5 g/dL (ref 3.5–5.0)
BUN: 36 mg/dL — ABNORMAL HIGH (ref 6–20)
CHLORIDE: 98 mmol/L — AB (ref 101–111)
CO2: 30 mmol/L (ref 22–32)
CREATININE: 5.14 mg/dL — AB (ref 0.44–1.00)
Calcium: 9.1 mg/dL (ref 8.9–10.3)
GFR, EST AFRICAN AMERICAN: 12 mL/min — AB (ref >60)
GFR, EST NON AFRICAN AMERICAN: 10 mL/min — AB (ref >60)
Glucose, Bld: 183 mg/dL — ABNORMAL HIGH (ref 65–99)
POTASSIUM: 5.7 mmol/L — AB (ref 3.5–5.1)
Phosphorus: 1.1 mg/dL — ABNORMAL LOW (ref 2.5–4.6)
Sodium: 138 mmol/L (ref 135–145)

## 2016-06-15 MED ORDER — LIDOCAINE-PRILOCAINE 2.5-2.5 % EX CREA
TOPICAL_CREAM | CUTANEOUS | Status: AC
  Filled 2016-06-15: qty 5

## 2016-06-15 MED ORDER — BACITRACIN ZINC 500 UNIT/GM EX OINT
TOPICAL_OINTMENT | CUTANEOUS | Status: AC
  Filled 2016-06-15: qty 0.9

## 2016-06-15 MED ORDER — BACITRACIN ZINC 500 UNIT/GM EX OINT
TOPICAL_OINTMENT | Freq: Once | CUTANEOUS | Status: AC
  Administered 2016-06-15: 10:00:00 via TOPICAL

## 2016-06-15 MED ORDER — LIDOCAINE-PRILOCAINE 2.5-2.5 % EX CREA
TOPICAL_CREAM | Freq: Once | CUTANEOUS | Status: AC
  Administered 2016-06-15: 09:00:00 via TOPICAL

## 2016-06-15 MED ORDER — HYDRALAZINE HCL 50 MG PO TABS
50.0000 mg | ORAL_TABLET | Freq: Once | ORAL | Status: AC
  Administered 2016-06-15: 50 mg via ORAL
  Filled 2016-06-15: qty 1

## 2016-06-15 NOTE — Progress Notes (Signed)
Subjective:   Patient known to our practice from outpatient Presented to ER with suicidal thoughts. Has been evaluated by psychiatrist Nephrology consult requested to arrange for HD  At present patient denies acute c/o No SOB No N/V Reports blister on her left arm close to access  Patient seen during dialysis Tolerating well    HEMODIALYSIS FLOWSHEET:  Blood Flow Rate (mL/min): 400 mL/min Arterial Pressure (mmHg): -150 mmHg Venous Pressure (mmHg): 210 mmHg Transmembrane Pressure (mmHg): 50 mmHg Ultrafiltration Rate (mL/min): 430 mL/min Dialysate Flow Rate (mL/min): 800 ml/min Conductivity: Machine : 14.2 Conductivity: Machine : 14.2 Dialysis Fluid Bolus: Normal Saline Bolus Amount (mL): 250 mL Dialysate Change:  (3k) Intra-Hemodialysis Comments: 179. pt alert, no c/o, bp high. md present.     Objective:  Vital signs in last 24 hours:  Temp:  [97.9 F (36.6 C)-98.7 F (37.1 C)] 97.9 F (36.6 C) (07/21 1500) Pulse Rate:  [77-93] 93 (07/21 1530) Resp:  [17-22] 22 (07/21 1530) BP: (152-204)/(80-108) 204/87 mmHg (07/21 1530) SpO2:  [99 %-100 %] 100 % (07/21 1530)  Weight change:  Filed Weights   06/11/16 1857 06/13/16 1030 06/13/16 1429  Weight: 79.833 kg (176 lb) 79.833 kg (176 lb) 78.8 kg (173 lb 11.6 oz)    Intake/Output:   No intake or output data in the 24 hours ending 06/15/16 1541   Physical Exam: General: NAD, laying in bed  HEENT anicteric  Neck supple  Pulm/lungs Clear b/l  CVS/Heart regular  Abdomen:  Soft, NT  Extremities: No edema  Neurologic: Alert, able to answer questions  Skin: No acute rashes  Access: Left arm AV access, blister noted close to access       Basic Metabolic Panel:   Recent Labs Lab 06/11/16 1900 06/12/16 0600 06/13/16 1059 06/13/16 1818  NA 139 141 135  --   K 3.4* 2.6* 6.1* 5.5*  CL 95* 97* 95*  --   CO2 28 32 28  --   GLUCOSE 207* 32* 264*  --   BUN 43* 44* 62*  --   CREATININE 7.10* 7.79* 9.43*  --    CALCIUM 9.1 9.4 9.2  --      CBC:  Recent Labs Lab 06/11/16 1900  WBC 5.8  HGB 11.8*  HCT 34.9*  MCV 100.8*  PLT 97*      Microbiology:  No results found for this or any previous visit (from the past 720 hour(s)).  Coagulation Studies: No results for input(s): LABPROT, INR in the last 72 hours.  Urinalysis: No results for input(s): COLORURINE, LABSPEC, PHURINE, GLUCOSEU, HGBUR, BILIRUBINUR, KETONESUR, PROTEINUR, UROBILINOGEN, NITRITE, LEUKOCYTESUR in the last 72 hours.  Invalid input(s): APPERANCEUR    Imaging: No results found.   Medications:     . aspirin  81 mg Oral Daily  . cloNIDine  0.2 mg Oral BID  . divalproex  1,000 mg Oral QHS  . escitalopram  15 mg Oral Daily  . hydrALAZINE  50 mg Oral TID  . insulin aspart  0-15 Units Subcutaneous TID WC  . insulin aspart  0-5 Units Subcutaneous QHS  . metoprolol tartrate  50 mg Oral BID  . QUEtiapine  200 mg Oral BID  . traZODone  100 mg Oral QHS     Assessment/ Plan:  35 y.o. female with pmhx of ESRD, AOCD, SHPTH, HTN, hx of AVF, complex mood disorder, mild cognitive impairment, diabetes mellitus, hx of recurrent staph bacteremia, LUE AVG placed 04/22/15.  1. ESRD on HD MWF/ [address] [facility]: Patient  seen during dialysis Tolerating well   2. Anemia of CKD:  Hgb 11.8 EPO if Hgb <11  3. SHPTH:  will continue to monitor phos   4. Hyperkalemia - discontinue Kcl supplements  5. Blisters noted near access - draw blood cultures during dialysis - may need antibiotics if positive   LOS:  Kelly [name] 7/21/20173:41 PM

## 2016-06-15 NOTE — Progress Notes (Signed)
Post HD vitals 

## 2016-06-15 NOTE — ED Notes (Signed)
Dr. Jimmye Norman notified for continued hypertension 187/85, order for 50mg  hydralize received. Pt ambulatory around room without difficulty, multiple requests approx every 10 min received from pt. Pt appears in no acute distress, pt denies needs, no bleeding noted from dialysis access area. Skin normal color warm and dry, resps unlabored. 3+ radial pulses noted.

## 2016-06-15 NOTE — ED Notes (Signed)
Tech did not take vitals at 1400. Pt was taken down to dialysis at 1532/ I will retake pts vitals when she is finished with dialysis

## 2016-06-15 NOTE — ED Notes (Signed)
Dr. Jimmye Norman notified of pt's hypertension 201/102. Will dose pt with hs metoprolol and clonidine for htn, will recheck blood pressure at 2040.

## 2016-06-15 NOTE — ED Notes (Signed)
Pt to have 3.5 hour dialysis. Emla cream ordered per Dr. Jacqualine Code, as it is not in the dialysis protocol.

## 2016-06-15 NOTE — Progress Notes (Signed)
LCSW called [facility] and she is not willing to accept this patient as her needs are too high. [phone]. LCSW reviewed HUB chart and no beds available is several family care homes. Will review again shortly.   [employer] LCSW [phone]

## 2016-06-15 NOTE — Consult Note (Signed)
  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 Patient is psychiatrically stable. Not acting out. No dangerous behavior currently. Not suicidal. Cooperative with treatment. Social work is working hard on trying to find some kind of placement for this completely stable woman. No change in medicine or treatment.

## 2016-06-15 NOTE — ED Notes (Signed)
Pt hypertensive, will dose with hs clonidine and lopressor for htn.

## 2016-06-15 NOTE — ED Notes (Signed)
Pt declines further blood pressure check at this time. Pt states "i'm not giving you my arm, you can stand here and look at it but leave me alone."

## 2016-06-15 NOTE — Progress Notes (Signed)
Pre HD vitals. 

## 2016-06-15 NOTE — Progress Notes (Signed)
LCSW called [facility] Ms [facility] she has given permission to fax patient out to any facility in [place] She requested that we fax over her patients Fl2, LCSW will clear with supervisor and call her back.  [employer] LCSW [phone]

## 2016-06-15 NOTE — ED Notes (Signed)
Dialysis nurse called; states that 1.2 liters was taken off. No problems. Other than high bp, vs wnl.

## 2016-06-15 NOTE — Progress Notes (Signed)
End of HD tx

## 2016-06-15 NOTE — Progress Notes (Signed)
Start of hd 

## 2016-06-15 NOTE — Progress Notes (Signed)
Post HD assessment  

## 2016-06-15 NOTE — ED Notes (Signed)
Received call from [facility] in dialysis that they have placed another pt ahead of her in dialysis. They will call for her in about 3 hours.Emla cream wiped off pt's arm.

## 2016-06-15 NOTE — ED Notes (Signed)
1000 medications held per dialysis protocol

## 2016-06-15 NOTE — Progress Notes (Signed)
Pre HD assessment  

## 2016-06-15 NOTE — ED Notes (Signed)
Pt returned from dialysis. Pt states "is that surgeon coming in to see my arm right now, i want to watch [place]. Damn, i don't eat [place], heat it up, do it now." pt updated on treatment process, tv channel changed. Pt eating sandwich and banana.

## 2016-06-15 NOTE — ED Notes (Signed)
Pt was checked on by myself/ Pt wide awake walking around room, constantly asking for staff members whom pass by/ When asked : if pt needs anything. Pt states "no boo I just want to talk"/ pt talked about a array of things/ Pt told staff to make her bed. Staff (myself) told patient that she was capable of doing so considering I was in another room with a total dependent pt. Staff rechecked on pt and pt had made her bed after cussing and has now decided to take a nap. Pt is in no signs of distress and is not in need of anything from staff at this time

## 2016-06-15 NOTE — ED Provider Notes (Signed)
-----------------------------------------   7:04 AM on 06/15/2016 -----------------------------------------   Blood pressure 152/92, pulse 84, temperature 98.7 F (37.1 C), temperature source Oral, resp. rate 18, height 5\' 2"  (1.575 m), weight 173 lb 11.6 oz (78.8 kg), SpO2 99 %.  The patient had no acute events since last update.  Calm and cooperative at this time.  Disposition is pending per Psychiatry/Behavioral Medicine team recommendations.     Orbie Pyo, MD 06/15/16 [phone]

## 2016-06-15 NOTE — ED Notes (Signed)
Pt given insulin 2 units and breakfast. Dialysis unit to call back regarding dialysis today

## 2016-06-15 NOTE — ED Notes (Signed)
Banana provided with meal tray.

## 2016-06-15 NOTE — ED Notes (Signed)
Reapplied Emla cream

## 2016-06-15 NOTE — ED Notes (Signed)
Pt in hallway, refusing to go back in room. Pt finally agreed to go into room, but stated she would take her time. This nurse encouraged her to return to her room, offering to get her a drink of juice when she was in her room. Pt then asked this nurse to cover her up, and I told her I would get her a warm blanket and cover her up, but she needed to stay in the room. Pt verbalized understanding. Pt given apple juice and a warm blanket.

## 2016-06-15 NOTE — ED Notes (Signed)
Pt asking to have this nurse look at "cuts" on her arms. Pt has 3 small abrasions on her left upper arm. Bacitracin ointment applied per order.

## 2016-06-15 NOTE — ED Notes (Signed)
Pt to dialysis via wheelchair

## 2016-06-16 LAB — GLUCOSE, CAPILLARY
GLUCOSE-CAPILLARY: 184 mg/dL — AB (ref 65–99)
GLUCOSE-CAPILLARY: 136 mg/dL — AB (ref 65–99)
GLUCOSE-CAPILLARY: 183 mg/dL — AB (ref 65–99)
Glucose-Capillary: 124 mg/dL — ABNORMAL HIGH (ref 65–99)

## 2016-06-16 NOTE — ED Notes (Signed)
Patient refused Hydralazine at this time, will attempt to give at next medication pass.

## 2016-06-16 NOTE — ED Notes (Signed)
Spoke with Kelly [name] in lab regarding blood culture orders. Kelly [name] states pt has two sets of blood cultures in lab that were obtained today.

## 2016-06-16 NOTE — ED Notes (Signed)
CBG 124 

## 2016-06-16 NOTE — ED Provider Notes (Signed)
Progress note  7:10 AM 06/16/2016  Patient's vitals are stable and patient is cooperative this morning. Patient has no focal findings and medically stable  patient is still waiting psych assessment and disposition.  Ruby Cola, MD 06/16/16 [phone]

## 2016-06-16 NOTE — ED Notes (Signed)
Pt continues to decline fsbs since coming back from dialysis. Pt states "you can get it later when i take my night stuff, leave me alone, don't touch me."

## 2016-06-16 NOTE — ED Notes (Signed)
Report to jennifer, rn.  

## 2016-06-17 LAB — GLUCOSE, CAPILLARY
GLUCOSE-CAPILLARY: 214 mg/dL — AB (ref 65–99)
GLUCOSE-CAPILLARY: 160 mg/dL — AB (ref 65–99)
GLUCOSE-CAPILLARY: 130 mg/dL — AB (ref 65–99)
Glucose-Capillary: 125 mg/dL — ABNORMAL HIGH (ref 65–99)

## 2016-06-17 NOTE — Progress Notes (Signed)
LCSW reviewed the hub and there has been no bed offers made for this patient. LCSW will resume search today.  Brenya Taulbee LCSW

## 2016-06-17 NOTE — ED Provider Notes (Signed)
-----------------------------------------   7:19 AM on 06/17/2016 -----------------------------------------   Blood pressure (!) 158/117, pulse 93, temperature 97.5 F (36.4 C), temperature source Oral, resp. rate 20, height 5\' 2"  (1.575 m), weight 173 lb 11.6 oz (78.8 kg), SpO2 99 %.  The patient had no acute events since last update.  Patient has been medically stable. Take x-rays evaluated the patient and believes the patient is psychiatrically stable. We are currently working with social work to find appropriate living condition/placement for this patient.   Harvest Dark, MD 06/17/16 [phone]

## 2016-06-17 NOTE — ED Notes (Signed)
Pt given blankets and into the bed with her lunch tray

## 2016-06-17 NOTE — ED Notes (Signed)
Pt states doesn't like pizza or celery. Requesting sandwich tray which she was given

## 2016-06-17 NOTE — ED Notes (Signed)
Pt given meal tray that does not meet diet requirement.  Called dining services for new meal tray.

## 2016-06-17 NOTE — Progress Notes (Signed)
LCSW called [facility]  [phone] as they stated they had both coed beds available. LCSW described patients good  Behaviors and inconsistent behaviors when in dialysis, the group home provider stated she is not able to provide that level of care and had vunerable patients with her homes and staff now.  LCSW thanked her and will keep her in mind.   C Wyndi Northrup LCSW

## 2016-06-17 NOTE — ED Notes (Signed)
Pt c/o feeling sob with laying flat - sp02 sitting 100% and 99% reclining. Bed kept at a 45 degree angle at pt's request for sleeping/comfort

## 2016-06-17 NOTE — ED Notes (Signed)
Meal tray delivered at this time. 

## 2016-06-17 NOTE — ED Notes (Addendum)
Pt not cooperating, using foul language and refusing to take medications.

## 2016-06-17 NOTE — ED Notes (Signed)
PT  VOL   PENDING  PLACEMENT  PT  SEEN  BY  DR  CLAPACS  ON 06/13/16  PT  DOES  NOT MEET CRITERIA  FOR  IN PT  ADMISSION  [facility]  PAPERS  RESCINDED  ON  06/12/16  BY  DR  [employer]

## 2016-06-18 LAB — RENAL FUNCTION PANEL
ALBUMIN: 3.5 g/dL (ref 3.5–5.0)
ANION GAP: 13 (ref 5–15)
BUN: 68 mg/dL — ABNORMAL HIGH (ref 6–20)
CALCIUM: 9.7 mg/dL (ref 8.9–10.3)
CO2: 25 mmol/L (ref 22–32)
Chloride: 97 mmol/L — ABNORMAL LOW (ref 101–111)
Creatinine, Ser: 8.99 mg/dL — ABNORMAL HIGH (ref 0.44–1.00)
GFR calc Af Amer: 6 mL/min — ABNORMAL LOW (ref >60)
GFR calc non Af Amer: 5 mL/min — ABNORMAL LOW (ref >60)
GLUCOSE: 160 mg/dL — AB (ref 65–99)
PHOSPHORUS: 2.7 mg/dL (ref 2.5–4.6)
Potassium: 7.3 mmol/L (ref 3.5–5.1)
SODIUM: 135 mmol/L (ref 135–145)

## 2016-06-18 LAB — CBC
HCT: 28 % — ABNORMAL LOW (ref 35.0–47.0)
HEMOGLOBIN: 9.2 g/dL — AB (ref 12.0–16.0)
MCH: 32.2 pg (ref 26.0–34.0)
MCHC: 32.8 g/dL (ref 32.0–36.0)
MCV: 98.2 fL (ref 80.0–100.0)
PLATELETS: 73 10*3/uL — AB (ref 150–440)
RBC: 2.85 MIL/uL — ABNORMAL LOW (ref 3.80–5.20)
RDW: 14 % (ref 11.5–14.5)
WBC: 5.3 10*3/uL (ref 3.6–11.0)

## 2016-06-18 LAB — GLUCOSE, CAPILLARY
GLUCOSE-CAPILLARY: 106 mg/dL — AB (ref 65–99)
GLUCOSE-CAPILLARY: 270 mg/dL — AB (ref 65–99)
Glucose-Capillary: 166 mg/dL — ABNORMAL HIGH (ref 65–99)
Glucose-Capillary: 130 mg/dL — ABNORMAL HIGH (ref 65–99)

## 2016-06-18 MED ORDER — CLONIDINE HCL 0.1 MG PO TABS
0.1000 mg | ORAL_TABLET | Freq: Once | ORAL | Status: AC
  Administered 2016-06-18: 0.1 mg via ORAL
  Filled 2016-06-18: qty 1

## 2016-06-18 MED ORDER — HYDRALAZINE HCL 10 MG PO TABS
10.0000 mg | ORAL_TABLET | Freq: Once | ORAL | Status: AC
  Administered 2016-06-18: 10 mg via ORAL
  Filled 2016-06-18: qty 1

## 2016-06-18 MED ORDER — DIPHENHYDRAMINE HCL 25 MG PO CAPS
25.0000 mg | ORAL_CAPSULE | Freq: Once | ORAL | Status: AC
  Administered 2016-06-18: 25 mg via ORAL
  Filled 2016-06-18: qty 1

## 2016-06-18 NOTE — ED Notes (Signed)
Pt standing in room, given breakfast. Pt given bath with assistance of rover.

## 2016-06-18 NOTE — Progress Notes (Signed)
END OF HD TX

## 2016-06-18 NOTE — Progress Notes (Signed)
Pt arterial site appears to have stopped bleeding. BP meds were given as ordered. Pt no c/o. Transportation taking pt back to ED room.

## 2016-06-18 NOTE — ED Notes (Signed)

## 2016-06-18 NOTE — ED Notes (Signed)
Pt to dialysis unit

## 2016-06-18 NOTE — ED Provider Notes (Signed)
-----------------------------------------   7:16 AM on 06/18/2016 -----------------------------------------   Blood pressure (!) 197/93, pulse 86, temperature 98.3 F (36.8 C), temperature source Oral, resp. rate 19, height 5\' 2"  (1.575 m), weight 173 lb 11.6 oz (78.8 kg), SpO2 96 %.  The patient had no acute events since last update.  Calm and cooperative at this time.   The patient did receive some benadryl overnight for sleep. She is awaiting placement at this time. We will attempt to have her dialyzed today. The patient did complain of some chest pain so she received an EKG. The pain resolved without intervention.  ED ECG REPORT I, Loney Hering, the attending physician, personally viewed and interpreted this ECG.   Date: 06/18/2016  EKG Time: 302  Rate: 86  Rhythm: normal sinus rhythm  Axis: normal  Intervals:none  ST&T Change: none      Loney Hering, MD 06/18/16 [phone]

## 2016-06-18 NOTE — Progress Notes (Signed)
POST HD vitals

## 2016-06-18 NOTE — ED Notes (Signed)
Report received from Dialysis nurse

## 2016-06-18 NOTE — Progress Notes (Signed)
Pre HD  

## 2016-06-18 NOTE — Progress Notes (Signed)
PRE HD ASSESSMENT 

## 2016-06-18 NOTE — Progress Notes (Signed)
CSW spoke with Kelly [name] [phone]. Kelly [name] has an available bed and may be able to meet pt's needs. CSW will fax FL2 and await response.   Kelly [name], MSW, [place]

## 2016-06-18 NOTE — Progress Notes (Signed)
Subjective:  Patient completed hemodialysis today. Resting comfortably in bed. States she didn't sleep very well last night.   Objective:  Vital signs in last 24 hours:  Temp:  [98.1 F (36.7 C)-98.9 F (37.2 C)] 98.9 F (37.2 C) (07/24 1035) Pulse Rate:  [86-96] 96 (07/24 1300) Resp:  [12-30] 30 (07/24 1300) BP: (169-232)/(90-124) 215/103 (07/24 1300) SpO2:  [96 %-100 %] 98 % (07/24 1300)  Weight change:  Filed Weights   06/11/16 1857 06/13/16 1030 06/13/16 1429  Weight: 79.8 kg (176 lb) 79.8 kg (176 lb) 78.8 kg (173 lb 11.6 oz)    Intake/Output:   No intake or output data in the 24 hours ending 06/18/16 1333   Physical Exam: General: NAD, laying in bed  HEENT anicteric  Neck supple  Pulm/lungs Clear b/l, normal effort  CVS/Heart Regular, no rubs  Abdomen:  Soft, NT  Extremities: No edema  Neurologic: Alert, able to answer questions  Skin: No acute rashes  Access: Left arm AV access       Basic Metabolic Panel:   Recent Labs Lab 06/11/16 1900 06/12/16 0600 06/13/16 1059 06/13/16 1818 06/15/16 1550  NA 139 141 135  --  138  K 3.4* 2.6* 6.1* 5.5* 5.7*  CL 95* 97* 95*  --  98*  CO2 28 32 28  --  30  GLUCOSE 207* 32* 264*  --  183*  BUN 43* 44* 62*  --  36*  CREATININE 7.10* 7.79* 9.43*  --  5.14*  CALCIUM 9.1 9.4 9.2  --  9.1  PHOS  --   --   --   --  1.1*     CBC:  Recent Labs Lab 06/11/16 1900 06/15/16 1550 06/18/16 1040  WBC 5.8 4.5 5.3  HGB 11.8* 10.3* 9.2*  HCT 34.9* 30.4* 28.0*  MCV 100.8* 99.9 98.2  PLT 97* 83* 73*      Microbiology:  Recent Results (from the past 720 hour(s))  Culture, blood (routine x 2)     Status: None (Preliminary result)   Collection Time: 06/15/16  4:20 PM  Result Value Ref Range Status   Specimen Description BLOOD ARTERIAL LINE  Final   Special Requests   Final    BOTTLES DRAWN AEROBIC AND ANAEROBIC  AER [place] ANA 5CC   Culture NO GROWTH 3 DAYS  Final   Report Status PENDING  Incomplete  Culture,  blood (routine x 2)     Status: None (Preliminary result)   Collection Time: 06/15/16  4:20 PM  Result Value Ref Range Status   Specimen Description BLOOD DRAWN BY DIALYSIS  Final   Special Requests BOTTLES DRAWN AEROBIC AND ANAEROBIC  5CC  Final   Culture NO GROWTH 3 DAYS  Final   Report Status PENDING  Incomplete    Coagulation Studies: No results for input(s): LABPROT, INR in the last 72 hours.  Urinalysis: No results for input(s): COLORURINE, LABSPEC, PHURINE, GLUCOSEU, HGBUR, BILIRUBINUR, KETONESUR, PROTEINUR, UROBILINOGEN, NITRITE, LEUKOCYTESUR in the last 72 hours.  Invalid input(s): APPERANCEUR    Imaging: No results found.   Medications:     . aspirin  81 mg Oral Daily  . cloNIDine  0.2 mg Oral BID  . divalproex  1,000 mg Oral QHS  . escitalopram  15 mg Oral Daily  . hydrALAZINE  50 mg Oral TID  . insulin aspart  0-15 Units Subcutaneous TID WC  . insulin aspart  0-5 Units Subcutaneous QHS  . metoprolol tartrate  50 mg Oral BID  .  QUEtiapine  200 mg Oral BID  . traZODone  100 mg Oral QHS     Assessment/ Plan:  35 y.o. female with pmhx of ESRD, AOCD, SHPTH, HTN, hx of AVF, complex mood disorder, mild cognitive impairment, diabetes mellitus, hx of recurrent staph bacteremia, LUE AVG placed 04/22/15.  1. ESRD on HD MWF/ [address] [facility]: patient completed dialysis today.  We will continue dialysis on a Monday, was a, Friday schedule while she remains here.  2. Anemia of CKD:  Hemoglobin currently 9.2.  Continue to monitor.  Consider restarting on Epogen with next treatment.  3. SHPTH:  Heck phosphorus with next dialysis treatment.   4. Hyperkalemia Potassium was high previously.  Continue to periodically monitor.    LOS:  Joanthony Hamza 7/24/20171:33 PM

## 2016-06-18 NOTE — Progress Notes (Signed)
Start of HD tx

## 2016-06-18 NOTE — Progress Notes (Signed)
Lateef MD ordered bp meds post tx. Pt. Arterial site continues to bleed 40 minutes post tx d/t hypertension.

## 2016-06-18 NOTE — ED Notes (Signed)
MD aware of pt's newly charted BP; stated to resume scheduled hydralazine at 2200

## 2016-06-18 NOTE — Progress Notes (Signed)
HD post assessment

## 2016-06-18 NOTE — Progress Notes (Signed)
CSW contacted group home manager Lavada Mesi [phone]. Ms. Jeanell Sparrow may have an available bed for pt. CSW left a message and will call again later today.  [name], MSW, [place]

## 2016-06-18 NOTE — ED Notes (Signed)
Patient complaining of chest pain, patient unspecific about location of pain, rating pain and describing pain.  MD aware.

## 2016-06-19 LAB — GLUCOSE, CAPILLARY
GLUCOSE-CAPILLARY: 127 mg/dL — AB (ref 65–99)
GLUCOSE-CAPILLARY: 170 mg/dL — AB (ref 65–99)
Glucose-Capillary: 190 mg/dL — ABNORMAL HIGH (ref 65–99)
Glucose-Capillary: 170 mg/dL — ABNORMAL HIGH (ref 65–99)

## 2016-06-19 NOTE — ED Notes (Signed)
Breakfast was given to patient. 

## 2016-06-19 NOTE — ED Notes (Signed)

## 2016-06-19 NOTE — ED Notes (Signed)

## 2016-06-19 NOTE — ED Notes (Signed)
Lunch was given to patient 

## 2016-06-19 NOTE — Progress Notes (Addendum)
CSW contacted [place] [employer] [phone] to inquire about additional funding for the pt. Group homes are reluctant to accept pt because they do not want to pay for pt to have a sitter at dialysis. With additional funding to help compensate the cost of a sitter, group homes may be more willing to accept pt.   Medicaid worker suggested a private duty nurse for pt. CSW called [phone] to inquire about a private duty nurse for pt through [place]. No answer. CSW left a message. Will follow up and continue to work on additional funding and placement for pt.  [name], MSW, [place]

## 2016-06-19 NOTE — ED Notes (Signed)
Patient in room taking a pan bath.

## 2016-06-19 NOTE — Progress Notes (Signed)
CSW called Licensed [facility] back [phone]. Mariann Laster states that she will have an available female bed by Thursday of this week. Mariann Laster may be willing to accept pt pending pt getting approved for additional funding for a sitter. CSW will continue to work on this.  [name], MSW, [place]

## 2016-06-19 NOTE — Progress Notes (Signed)
CSW called [phone] to try to arrange private duty nursing for pt. No answer. Left a message. Will continue to try to secure services for pt.   [name], MSW, [place]

## 2016-06-19 NOTE — ED Provider Notes (Signed)
-----------------------------------------   7:48 AM on 06/19/2016 -----------------------------------------   Blood pressure 152/76, pulse 94, temperature 98.4 F (36.9 C), temperature source Oral, resp. rate 16, height 5\' 2"  (1.575 m), weight 173 lb 11.6 oz (78.8 kg), SpO2 95 %.  The patient had no acute events since last update.  Calm and cooperative at this time. Patient received dialysis yesterday 06/18/16. Disposition is pending per Psychiatry/Behavioral Medicine team recommendations.     Rudene Re, MD 06/19/16 [phone]

## 2016-06-20 LAB — GLUCOSE, CAPILLARY
GLUCOSE-CAPILLARY: 90 mg/dL (ref 65–99)
GLUCOSE-CAPILLARY: 175 mg/dL — AB (ref 65–99)
Glucose-Capillary: 94 mg/dL (ref 65–99)
Glucose-Capillary: 98 mg/dL (ref 65–99)

## 2016-06-20 LAB — CULTURE, BLOOD (ROUTINE X 2)
CULTURE: NO GROWTH
Culture: NO GROWTH

## 2016-06-20 MED ORDER — HYDRALAZINE HCL 50 MG PO TABS
100.0000 mg | ORAL_TABLET | Freq: Three times a day (TID) | ORAL | Status: DC
  Administered 2016-06-20 – 2016-07-08 (×39): 100 mg via ORAL
  Filled 2016-06-20 (×41): qty 2

## 2016-06-20 MED ORDER — CLONIDINE HCL 0.1 MG PO TABS
0.3000 mg | ORAL_TABLET | Freq: Two times a day (BID) | ORAL | Status: DC
  Administered 2016-06-20 – 2016-07-08 (×33): 0.3 mg via ORAL
  Filled 2016-06-20 (×21): qty 3

## 2016-06-20 MED ORDER — EPOETIN ALFA 10000 UNIT/ML IJ SOLN
10000.0000 [IU] | INTRAMUSCULAR | Status: DC
  Administered 2016-06-20 – 2016-07-23 (×14): 10000 [IU] via INTRAVENOUS
  Filled 2016-06-20 (×16): qty 1

## 2016-06-20 NOTE — Progress Notes (Signed)
Pre HD  

## 2016-06-20 NOTE — ED Provider Notes (Signed)
-----------------------------------------   7:10 AM on 06/20/2016 -----------------------------------------   Blood pressure (!) 177/116, pulse 95, temperature 98.1 F (36.7 C), temperature source Oral, resp. rate 20, height 5\' 2"  (1.575 m), weight 173 lb 11.6 oz (78.8 kg), SpO2 98 %.  The patient had no acute events since last update.  Calm and cooperative at this time.  Disposition is pending per Psychiatry/Behavioral Medicine team recommendations.     Carrie Mew, MD 06/20/16 [phone]

## 2016-06-20 NOTE — ED Notes (Signed)

## 2016-06-20 NOTE — ED Notes (Signed)
Patient asked for help in making her bed and getting more warm blankets for her bed.  We made the bed and she laid back down.

## 2016-06-20 NOTE — ED Notes (Signed)

## 2016-06-20 NOTE — ED Notes (Signed)
Pt in dialysis

## 2016-06-20 NOTE — Progress Notes (Signed)
HD tx started  

## 2016-06-20 NOTE — Progress Notes (Signed)
HD TX ended  

## 2016-06-20 NOTE — Progress Notes (Signed)
Subjective:  Patient seen in the emergency department.  Still no permanent lodging found for her. Shes due for dialysis today.  Objective:  Vital signs in last 24 hours:  Temp:  [98 F (36.7 C)-98.1 F (36.7 C)] 98 F (36.7 C) (07/26 1025) Pulse Rate:  [87-99] 95 (07/26 1025) Resp:  [14-20] 20 (07/26 0548) BP: (158-210)/(74-140) 206/140 (07/26 1025) SpO2:  [96 %-100 %] 96 % (07/26 1025)  Weight change:  Filed Weights   06/11/16 1857 06/13/16 1030 06/13/16 1429  Weight: 79.8 kg (176 lb) 79.8 kg (176 lb) 78.8 kg (173 lb 11.6 oz)    Intake/Output:   No intake or output data in the 24 hours ending 06/20/16 1145   Physical Exam: General: NAD, laying in bed  HEENT anicteric  Neck supple  Pulm/lungs Clear b/l, normal effort  CVS/Heart Regular, no rubs  Abdomen:  Soft, NT  Extremities: No edema  Neurologic: Alert, able to answer questions  Skin: No acute rashes  Access: Left arm AV access       Basic Metabolic Panel:   Recent Labs Lab 06/13/16 1818 06/15/16 1550 06/18/16 1040  NA  --  138 135  K 5.5* 5.7* 7.3*  CL  --  98* 97*  CO2  --  30 25  GLUCOSE  --  183* 160*  BUN  --  36* 68*  CREATININE  --  5.14* 8.99*  CALCIUM  --  9.1 9.7  PHOS  --  1.1* 2.7     CBC:  Recent Labs Lab 06/15/16 1550 06/18/16 1040  WBC 4.5 5.3  HGB 10.3* 9.2*  HCT 30.4* 28.0*  MCV 99.9 98.2  PLT 83* 73*      Microbiology:  Recent Results (from the past 720 hour(s))  Culture, blood (routine x 2)     Status: None   Collection Time: 06/15/16  4:20 PM  Result Value Ref Range Status   Specimen Description BLOOD ARTERIAL LINE  Final   Special Requests   Final    BOTTLES DRAWN AEROBIC AND ANAEROBIC  AER [place] ANA 5CC   Culture NO GROWTH 5 DAYS  Final   Report Status 06/20/2016 FINAL  Final  Culture, blood (routine x 2)     Status: None   Collection Time: 06/15/16  4:20 PM  Result Value Ref Range Status   Specimen Description BLOOD DRAWN BY DIALYSIS  Final   Special  Requests BOTTLES DRAWN AEROBIC AND ANAEROBIC  5CC  Final   Culture NO GROWTH 5 DAYS  Final   Report Status 06/20/2016 FINAL  Final    Coagulation Studies: No results for input(s): LABPROT, INR in the last 72 hours.  Urinalysis: No results for input(s): COLORURINE, LABSPEC, PHURINE, GLUCOSEU, HGBUR, BILIRUBINUR, KETONESUR, PROTEINUR, UROBILINOGEN, NITRITE, LEUKOCYTESUR in the last 72 hours.  Invalid input(s): APPERANCEUR    Imaging: No results found.   Medications:     . aspirin  81 mg Oral Daily  . cloNIDine  0.2 mg Oral BID  . divalproex  1,000 mg Oral QHS  . escitalopram  15 mg Oral Daily  . hydrALAZINE  50 mg Oral TID  . insulin aspart  0-15 Units Subcutaneous TID WC  . insulin aspart  0-5 Units Subcutaneous QHS  . metoprolol tartrate  50 mg Oral BID  . QUEtiapine  200 mg Oral BID  . traZODone  100 mg Oral QHS     Assessment/ Plan:  35 y.o. female with pmhx of ESRD, AOCD, SHPTH, HTN, hx of AVF,  complex mood disorder, mild cognitive impairment, diabetes mellitus, hx of recurrent staph bacteremia, LUE AVG placed 04/22/15.  1. ESRD on HD MWF/ [address] [facility]: Pt due for HD today, orders have been prepared.   2. Anemia of CKD:  Hemoglobin currently 9.2.  Restart the pt on epogen.   3. SHPTH:  Check phos today, not on binders.   4. Hyperkalemia Potassium 7.3, use 1K bath today.  5.  Hypertension: increase clonidine to 0.3mg  po bid and hydralazine to 100mg  po tid.     LOS:  Kelly [name] 7/26/201711:45 AM

## 2016-06-20 NOTE — Progress Notes (Signed)
Pre HD ASSESSMENT 

## 2016-06-20 NOTE — Progress Notes (Signed)
CSW contacted [facility] [phone] and inquired how much additional funding pt would need to be approved for to cover a sitter. Mariann Laster stated that group home is no longer willing to accept pt because pt's behaviors are too extensive for their level of care.  CSW also called Medicaid again to inquire about private duty nursing. No answer, left a message.   CSW will continue to try to obtain resources for pt and secure placement.  [name], MSW, [place]

## 2016-06-20 NOTE — Progress Notes (Signed)
Post HD assessment  

## 2016-06-20 NOTE — ED Notes (Addendum)

## 2016-06-20 NOTE — Progress Notes (Signed)
Post HD  

## 2016-06-21 LAB — GLUCOSE, CAPILLARY
GLUCOSE-CAPILLARY: 152 mg/dL — AB (ref 65–99)
GLUCOSE-CAPILLARY: 199 mg/dL — AB (ref 65–99)
Glucose-Capillary: 159 mg/dL — ABNORMAL HIGH (ref 65–99)
Glucose-Capillary: 135 mg/dL — ABNORMAL HIGH (ref 65–99)
Glucose-Capillary: 70 mg/dL (ref 65–99)

## 2016-06-21 NOTE — Progress Notes (Signed)
CSW was contacted by Virgil Benedict, [facility] worker [phone]. Barrie Lyme informed CSW that she has been in contact with pt's legal guardian, Porfirio Mylar, and they are working on placement for pt. The hope is that they will be able to secure placement for pt and new facility will be able to pick pt up from the ED. CSW will fax over [place].  [name], MSW, [place]

## 2016-06-21 NOTE — Progress Notes (Signed)
CSW Received phone call from legal guardian director [phone]. Director is very upset that CSW cannot send FL2. CSW was instructed by her supervisor that sending the [place] via manual fax, as guardian company is not the the HUB, would be prohibited. CSW instructed legal guardian Advice worker to speak to [employer], Zack Brooks and [employer], for further clarification on this policy.   [name], MSW, [place]

## 2016-06-21 NOTE — Progress Notes (Signed)
CSW consulted with supervisor, [facility], about pt's case. Hope states that trying to find another group home for pt while she sits in the ED is not an appropriate plan. Pt's group home, [facility], needs to be contacted and instructed that pt must return to group home and wait for alternative placement there.  CSW called [facility] and spoke with manager Ivin Booty [phone]. CSW stated that she understood the frustration of the facility, however, they would need to come get pt because she has been medically and psychiatrically cleared and the ED is no longer appropriate for pt's level of care. Ivin Booty states that she d/c'd pt and will not take her back. CSW informed Ivin Booty that an APS report would be made and a complaint would need to be filed with [facility]. Ivin Booty expressed her understanding of the situation but is still unwilling to accept pt back into group home.  [name], MSW, [place]

## 2016-06-21 NOTE — Progress Notes (Signed)
CSW called [phone] to arrange private duty nursing for the pt. Representative stated that CSW would have to go through a nursing agency in order for Medicaid to bill for private duty nursing. Representative gave CSW resources for obtaining a list of nursing agencies in [place]. CSW will follow up and try to get process started for pt.  [name], MSW, [place]

## 2016-06-21 NOTE — Consult Note (Signed)
  Psychiatry: Follow-up note to restate that I do not think this patient needs any further psychiatric treatment. In effect I don't think she is truly a psychiatric patient so much as a person with behavior problems related to neurologic injury. Her behavior in the emergency room over the last couple days has been calm and cooperative despite a lot of chaos around her. Social work is continuing to work on trying to find appropriate placement.

## 2016-06-21 NOTE — ED Provider Notes (Addendum)
-----------------------------------------   6:05 AM on 06/21/2016 -----------------------------------------   Blood pressure (!) 199/98, pulse 89, temperature 98 F (36.7 C), temperature source Oral, resp. rate 20, height 5\' 2"  (1.575 m), weight 173 lb 11.6 oz (78.8 kg), SpO2 96 %.  The patient had no acute events since last update.  She was dialyzed yesterday. Remains hypertensive; home meds were ordered by nephrology. Calm and cooperative at this time.  Disposition is pending per Psychiatry/Behavioral Medicine team recommendations.     Paulette Blanch, MD 06/21/16 [id number]    Paulette Blanch, MD 06/21/16 [phone]
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
## 2016-06-22 LAB — CBC
HEMATOCRIT: 24.1 % — AB (ref 35.0–47.0)
HEMOGLOBIN: 8.1 g/dL — AB (ref 12.0–16.0)
MCH: 33 pg (ref 26.0–34.0)
MCHC: 33.6 g/dL (ref 32.0–36.0)
MCV: 98 fL (ref 80.0–100.0)
Platelets: 81 10*3/uL — ABNORMAL LOW (ref 150–440)
RBC: 2.45 MIL/uL — ABNORMAL LOW (ref 3.80–5.20)
RDW: 14.1 % (ref 11.5–14.5)
WBC: 3.7 10*3/uL (ref 3.6–11.0)

## 2016-06-22 LAB — RENAL FUNCTION PANEL
ALBUMIN: 3.5 g/dL (ref 3.5–5.0)
ANION GAP: 8 (ref 5–15)
BUN: 41 mg/dL — ABNORMAL HIGH (ref 6–20)
CHLORIDE: 97 mmol/L — AB (ref 101–111)
CO2: 33 mmol/L — ABNORMAL HIGH (ref 22–32)
Calcium: 9.2 mg/dL (ref 8.9–10.3)
Creatinine, Ser: 8.05 mg/dL — ABNORMAL HIGH (ref 0.44–1.00)
GFR calc Af Amer: 7 mL/min — ABNORMAL LOW (ref >60)
GFR, EST NON AFRICAN AMERICAN: 6 mL/min — AB (ref >60)
Glucose, Bld: 184 mg/dL — ABNORMAL HIGH (ref 65–99)
PHOSPHORUS: 3.9 mg/dL (ref 2.5–4.6)
POTASSIUM: 5.5 mmol/L — AB (ref 3.5–5.1)
Sodium: 138 mmol/L (ref 135–145)

## 2016-06-22 LAB — GLUCOSE, CAPILLARY
GLUCOSE-CAPILLARY: 214 mg/dL — AB (ref 65–99)
Glucose-Capillary: 137 mg/dL — ABNORMAL HIGH (ref 65–99)
Glucose-Capillary: 179 mg/dL — ABNORMAL HIGH (ref 65–99)

## 2016-06-22 MED ORDER — AMLODIPINE BESYLATE 5 MG PO TABS
10.0000 mg | ORAL_TABLET | Freq: Every day | ORAL | Status: DC
Start: 2016-06-22 — End: 2016-07-30
  Administered 2016-06-22 – 2016-07-08 (×16): 10 mg via ORAL
  Administered 2016-07-09: 5 mg via ORAL
  Administered 2016-07-10 – 2016-07-30 (×18): 10 mg via ORAL
  Filled 2016-06-22 (×30): qty 2

## 2016-06-22 NOTE — ED Notes (Signed)
Two warms blankets given to pt.

## 2016-06-22 NOTE — Progress Notes (Signed)
LCSW spoke to [employer] of [facility] as Kelly [name] not available. Associate [name] informed this worker corrections needed to be done on the Fl2 as they were not reflecting the patient behavior. LCSW as per supervisor will make changes as appropriate.  In consultation with Asst Director, LCSW will consult with [facility], [facility]  to see if they can negotiate with original group home provider for the patient to remain placed there until they make other suitable arrangements.   LCSW called [name] [phone] [facility] and left a message. Awaiting a call back.   Called [name] and reviewed this patient history for future placement consideration. She will review with [name] and [employer] base later this afternoon.   [employer] LCSW [phone]

## 2016-06-22 NOTE — Progress Notes (Signed)
Pre Dialysis 

## 2016-06-22 NOTE — Consult Note (Signed)
  Psychiatry: Patient has had no behavior problems today. She has been pleasant and calm and cooperative. Patient continues to be free of any acute psychiatric problems or need for any psychiatric treatment. She continues to be in the emergency room entirely for placement. I know that social work is putting a great deal of effort into trying to work with the system to get the patient placed. No indication to make any changes to treatment.

## 2016-06-22 NOTE — Progress Notes (Signed)
Post dialysis 

## 2016-06-22 NOTE — Progress Notes (Signed)
Subjective:  Patient seen and evaluated during hemodialysis today. She continues to tolerate well. Blood has been drawn today. Blood pressure also remains high. Patient also had prolonged bleeding at the end of the last dialysis treatment.  Objective:  Vital signs in last 24 hours:  Temp:  [97.9 F (36.6 C)-99.1 F (37.3 C)] 99.1 F (37.3 C) (07/28 0940) Pulse Rate:  [84-98] 98 (07/28 1030) Resp:  [18-20] 18 (07/28 1030) BP: (172-206)/(77-101) 184/101 (07/28 1030) SpO2:  [93 %-99 %] 99 % (07/28 0955) Weight:  [81.3 kg (179 lb 3.7 oz)] 81.3 kg (179 lb 3.7 oz) (07/28 0940)  Weight change:  Filed Weights   06/13/16 1429 06/20/16 1205 06/22/16 0940  Weight: 78.8 kg (173 lb 11.6 oz) 78.8 kg (173 lb 11.6 oz) 81.3 kg (179 lb 3.7 oz)    Intake/Output:   No intake or output data in the 24 hours ending 06/22/16 1035   Physical Exam: General: NAD, laying in bed  HEENT anicteric  Neck supple  Pulm/lungs Clear b/l, normal effort  CVS/Heart Regular, no rubs  Abdomen:  Soft, NT  Extremities: No edema  Neurologic: Alert, able to answer questions  Skin: No acute rashes  Access: Left arm AV access       Basic Metabolic Panel:   Recent Labs Lab 06/15/16 1550 06/18/16 1040  NA 138 135  K 5.7* 7.3*  CL 98* 97*  CO2 30 25  GLUCOSE 183* 160*  BUN 36* 68*  CREATININE 5.14* 8.99*  CALCIUM 9.1 9.7  PHOS 1.1* 2.7     CBC:  Recent Labs Lab 06/15/16 1550 06/18/16 1040  WBC 4.5 5.3  HGB 10.3* 9.2*  HCT 30.4* 28.0*  MCV 99.9 98.2  PLT 83* 73*      Microbiology:  Recent Results (from the past 720 hour(s))  Culture, blood (routine x 2)     Status: None   Collection Time: 06/15/16  4:20 PM  Result Value Ref Range Status   Specimen Description BLOOD ARTERIAL LINE  Final   Special Requests   Final    BOTTLES DRAWN AEROBIC AND ANAEROBIC  AER [place] ANA 5CC   Culture NO GROWTH 5 DAYS  Final   Report Status 06/20/2016 FINAL  Final  Culture, blood (routine x 2)      Status: None   Collection Time: 06/15/16  4:20 PM  Result Value Ref Range Status   Specimen Description BLOOD DRAWN BY DIALYSIS  Final   Special Requests BOTTLES DRAWN AEROBIC AND ANAEROBIC  5CC  Final   Culture NO GROWTH 5 DAYS  Final   Report Status 06/20/2016 FINAL  Final    Coagulation Studies: No results for input(s): LABPROT, INR in the last 72 hours.  Urinalysis: No results for input(s): COLORURINE, LABSPEC, PHURINE, GLUCOSEU, HGBUR, BILIRUBINUR, KETONESUR, PROTEINUR, UROBILINOGEN, NITRITE, LEUKOCYTESUR in the last 72 hours.  Invalid input(s): APPERANCEUR    Imaging: No results found.   Medications:     . aspirin  81 mg Oral Daily  . cloNIDine  0.3 mg Oral BID  . divalproex  1,000 mg Oral QHS  . epoetin (EPOGEN/PROCRIT) injection  10,000 Units Intravenous Q M,W,F-HD  . escitalopram  15 mg Oral Daily  . hydrALAZINE  100 mg Oral TID  . insulin aspart  0-15 Units Subcutaneous TID WC  . insulin aspart  0-5 Units Subcutaneous QHS  . metoprolol tartrate  50 mg Oral BID  . QUEtiapine  200 mg Oral BID  . traZODone  100 mg Oral QHS  Assessment/ Plan:  35 y.o. female with pmhx of ESRD, AOCD, SHPTH, HTN, hx of AVF, complex mood disorder, mild cognitive impairment, diabetes mellitus, hx of recurrent staph bacteremia, LUE AVG placed 04/22/15.  1. ESRD on HD MWF/ [address] [facility]: Patient seen and evaluated during hemodialysis today. Tolerating well. Complete hemodialysis today. Awaiting labs.  2. Anemia of CKD:  Repeat CBC today. Continue Epogen 10,000 units IV with dialysis..   3. SHPTH:  Awaiting repeat phosphorus today. Patient not on binders therapy at the moment.  4. Hyperkalemia Awaiting repeat potassium today which has been already drawn.  5.  Hypertension: Blood pressure remains high despite clonidine, hydralazine, and metoprolol. Hesitant to add an ACE inhibitor or ARB given recent hyperkalemia.  Add amlodipine 10 mg by mouth daily.  6.  Prolonged  bleeding from hemodialysis access. This was noted at the end of the last else's treatment. Elevated blood pressure may have been playing a role. However we will obtain vascular surgery consultation. She may need a fistulogram early next week.    LOS:  Lismary Kiehn 7/28/201710:35 AM

## 2016-06-22 NOTE — Progress Notes (Signed)
LCSW spoke to [facility] and Mudlogger of Admissions Ms Jetty Peeks is willing to review patient information for housing consideration.  She does have a female- private room available. The earliest she can meet with patient would be on Monday. This facility is in the [facility] area. Phone # [phone] Ms Linders cell phone # and fax # [phone].  In consultation with AD Nathaniel Man patients info was faxed and received confirmation that it was received. Admissions director will review and consult with LCSW and possible meet with patient earliest Monday July 31st/17   [facility] Walker Shadow  [phone] Left detailed message that I faxed information to this lead. Called [facility] [employer] and left a message for Maple Hudson to call this worker back. Awaiting call back.   [employer] LCSW [phone]
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
## 2016-06-22 NOTE — ED Notes (Signed)
Transported the patient from dialysis to room.

## 2016-06-22 NOTE — Progress Notes (Signed)
Dialysis complete

## 2016-06-22 NOTE — Progress Notes (Signed)
I will plan to do an angiogram of the patient's AV access on Tuesday in specials

## 2016-06-22 NOTE — Progress Notes (Signed)
Dialysis  Started

## 2016-06-23 LAB — GLUCOSE, CAPILLARY
GLUCOSE-CAPILLARY: 140 mg/dL — AB (ref 65–99)
GLUCOSE-CAPILLARY: 135 mg/dL — AB (ref 65–99)
Glucose-Capillary: 168 mg/dL — ABNORMAL HIGH (ref 65–99)
Glucose-Capillary: 107 mg/dL — ABNORMAL HIGH (ref 65–99)

## 2016-06-23 NOTE — ED Notes (Signed)
Called  [facility], stated they would call back when ready.

## 2016-06-23 NOTE — ED Notes (Signed)
BEHAVIORAL HEALTH ROUNDING  Patient sleeping: No.  Patient alert and oriented: yes  Behavior appropriate: Yes. ; If no, describe:  Nutrition and fluids offered: Yes  Toileting and hygiene offered: Yes  Sitter present: not applicable, Q 15 min safety rounds and observation.  Law enforcement present: Yes ODS  

## 2016-06-23 NOTE — ED Notes (Signed)
Patient given meal tray.

## 2016-06-23 NOTE — ED Provider Notes (Signed)
-----------------------------------------   7:33 AM on 06/23/2016 -----------------------------------------   Blood pressure (!) 207/109, pulse (!) 110, temperature 98.4 F (36.9 C), temperature source Oral, resp. rate 20, height 5\' 2"  (1.575 m), weight 174 lb 13.2 oz (79.3 kg), SpO2 98 %.  The patient had no acute events since last update.  Calm and cooperative at this time.  She was dialyzed fully yesterday. Remains on a voluntary basis in the emergency department pending placement per Psychiatry/Behavioral Medicine team.     Paulette Blanch, MD 06/23/16 [phone]

## 2016-06-23 NOTE — ED Notes (Signed)
Pt Refused to take vital signs. Nurse notified.

## 2016-06-24 LAB — GLUCOSE, CAPILLARY
Glucose-Capillary: 146 mg/dL — ABNORMAL HIGH (ref 65–99)
Glucose-Capillary: 97 mg/dL (ref 65–99)
Glucose-Capillary: 159 mg/dL — ABNORMAL HIGH (ref 65–99)
Glucose-Capillary: 155 mg/dL — ABNORMAL HIGH (ref 65–99)

## 2016-06-24 MED ORDER — OXYMETAZOLINE HCL 0.05 % NA SOLN
1.0000 | Freq: Once | NASAL | Status: AC
  Administered 2016-06-24: 1 via NASAL
  Filled 2016-06-24: qty 15

## 2016-06-24 NOTE — ED Notes (Signed)
Pt oozing blood from right nostril. Corky Downs, Md informed. Nose clip applied and pt instructed to lie in bed for a while.

## 2016-06-24 NOTE — ED Notes (Signed)
BEHAVIORAL HEALTH ROUNDING Patient sleeping: Yes.   Patient alert and oriented: not applicable SLEEPING Behavior appropriate: Yes.  ; If no, describe: SLEEPING Nutrition and fluids offered: No SLEEPING Toileting and hygiene offered: NoSLEEPING Sitter present: not applicable, Q 15 min safety rounds and observation. Law enforcement present: Yes ODS 

## 2016-06-24 NOTE — ED Notes (Signed)
Pt given lunch tray.

## 2016-06-24 NOTE — ED Provider Notes (Signed)
-----------------------------------------   6:22 AM on 06/24/2016 -----------------------------------------   Blood pressure (!) 197/92, pulse (!) 104, temperature 98.2 F (36.8 C), temperature source Oral, resp. rate 20, height 5\' 2"  (1.575 m), weight 174 lb 13.2 oz (79.3 kg), SpO2 97 %.  The patient had no acute events since last update.  Calm and cooperative at this time. No noted change in status   Schuyler Amor, MD 06/24/16 [phone]

## 2016-06-24 NOTE — ED Notes (Signed)
Pt remains resting in bed at this time

## 2016-06-24 NOTE — ED Notes (Signed)
BEHAVIORAL HEALTH ROUNDING  Patient sleeping: No.  Patient alert and oriented: yes  Behavior appropriate: Yes. ; If no, describe:  Nutrition and fluids offered: Yes  Toileting and hygiene offered: Yes  Sitter present: not applicable, Q 15 min safety rounds and observation.  Law enforcement present: Yes ODS  

## 2016-06-24 NOTE — ED Notes (Signed)
Nurse warms pt food tray then pt states she does not want food and wants chips. Pt informed that she needs to eat the food that was just warmed for her.

## 2016-06-24 NOTE — ED Notes (Signed)
Pt will not keep nose clip on. Has taken it off multiple time. New orders received for Afrin nasal spray.

## 2016-06-24 NOTE — Progress Notes (Signed)
Patient is being considered for placement at  [facility] and director of Admissions Ms Jetty Peeks is willing to review patient information for housing consideration.  She does have a female- private room available. The earliest she can meet with patient would be on Monday. This facility is in the [facility] area. Phone # [phone] Ms Linders cell phone # and fax # [phone].  In consultation with AD Nathaniel Man patients info was faxed and received confirmation that it was received. Admissions director will review and consult with LCSW and possible meet with patient earliest Monday July 31st/17   [facility] Walker Shadow  [phone] Left detailed message that I faxed information to this lead. Called [facility] [employer] and left a message for Maple Hudson to call this worker back. Awaiting call back. Hand off with all these details left for other CSW Matthew Saras to work on Monday.   No other bed offers made.

## 2016-06-24 NOTE — ED Notes (Signed)
Pt. Given meal tray, for evening snack.

## 2016-06-25 DIAGNOSIS — E875 Hyperkalemia: Secondary | ICD-10-CM | POA: Diagnosis present

## 2016-06-25 LAB — COMPREHENSIVE METABOLIC PANEL
ALT: 12 U/L — AB (ref 14–54)
AST: 13 U/L — AB (ref 15–41)
Albumin: 3.3 g/dL — ABNORMAL LOW (ref 3.5–5.0)
Alkaline Phosphatase: 50 U/L (ref 38–126)
Anion gap: 8 (ref 5–15)
BILIRUBIN TOTAL: 0.4 mg/dL (ref 0.3–1.2)
BUN: 47 mg/dL — AB (ref 6–20)
CO2: 32 mmol/L (ref 22–32)
CREATININE: 8.73 mg/dL — AB (ref 0.44–1.00)
Calcium: 9.2 mg/dL (ref 8.9–10.3)
Chloride: 96 mmol/L — ABNORMAL LOW (ref 101–111)
GFR calc Af Amer: 6 mL/min — ABNORMAL LOW (ref >60)
GFR, EST NON AFRICAN AMERICAN: 5 mL/min — AB (ref >60)
Glucose, Bld: 151 mg/dL — ABNORMAL HIGH (ref 65–99)
POTASSIUM: 6.9 mmol/L — AB (ref 3.5–5.1)
Sodium: 136 mmol/L (ref 135–145)
TOTAL PROTEIN: 6.2 g/dL — AB (ref 6.5–8.1)

## 2016-06-25 LAB — GLUCOSE, CAPILLARY
GLUCOSE-CAPILLARY: 166 mg/dL — AB (ref 65–99)
Glucose-Capillary: 158 mg/dL — ABNORMAL HIGH (ref 65–99)
Glucose-Capillary: 92 mg/dL (ref 65–99)

## 2016-06-25 MED ORDER — HEPARIN SODIUM (PORCINE) 5000 UNIT/ML IJ SOLN: 5000.0000 [IU] | Freq: Three times a day (TID) | INTRAMUSCULAR | Status: DC

## 2016-06-25 MED ORDER — MORPHINE SULFATE (PF) 2 MG/ML IV SOLN
2.0000 mg | INTRAVENOUS | Status: DC | PRN
Start: 2016-06-25 — End: 2016-06-25

## 2016-06-25 MED ORDER — ONDANSETRON HCL 4 MG/2ML IJ SOLN: 4.0000 mg | Freq: Four times a day (QID) | INTRAMUSCULAR | Status: DC | PRN

## 2016-06-25 MED ORDER — ACETAMINOPHEN 325 MG PO TABS: 650.0000 mg | ORAL_TABLET | Freq: Four times a day (QID) | ORAL | Status: DC | PRN

## 2016-06-25 MED ORDER — QUETIAPINE FUMARATE 200 MG PO TABS
ORAL_TABLET | ORAL | Status: AC
  Administered 2016-06-25: 200 mg via ORAL
  Filled 2016-06-25: qty 1

## 2016-06-25 MED ORDER — ACETAMINOPHEN 650 MG RE SUPP: 650.0000 mg | Freq: Four times a day (QID) | RECTAL | Status: DC | PRN

## 2016-06-25 MED ORDER — METOPROLOL TARTRATE 50 MG PO TABS
ORAL_TABLET | ORAL | Status: AC
  Administered 2016-06-25: 50 mg via ORAL
  Filled 2016-06-25: qty 1

## 2016-06-25 MED ORDER — CLONIDINE HCL 0.1 MG PO TABS
ORAL_TABLET | ORAL | Status: AC
  Administered 2016-06-25: 0.3 mg via ORAL
  Filled 2016-06-25: qty 3

## 2016-06-25 MED ORDER — CEFAZOLIN IN D5W 1 GM/50ML IV SOLN: 1.0000 g | INTRAVENOUS | Status: AC

## 2016-06-25 MED ORDER — SODIUM CHLORIDE 0.9% FLUSH: 3.0000 mL | Freq: Two times a day (BID) | INTRAVENOUS | Status: DC

## 2016-06-25 MED ORDER — TRAZODONE HCL 100 MG PO TABS
ORAL_TABLET | ORAL | Status: AC
  Administered 2016-06-25: 100 mg via ORAL
  Filled 2016-06-25: qty 1

## 2016-06-25 MED ORDER — OXYCODONE HCL 5 MG PO TABS: 5.0000 mg | ORAL_TABLET | ORAL | Status: DC | PRN

## 2016-06-25 MED ORDER — ONDANSETRON HCL 4 MG PO TABS: 4.0000 mg | ORAL_TABLET | Freq: Four times a day (QID) | ORAL | Status: DC | PRN

## 2016-06-25 MED ORDER — DIVALPROEX SODIUM 500 MG PO DR TAB
DELAYED_RELEASE_TABLET | ORAL | Status: AC
  Administered 2016-06-25: 1000 mg via ORAL
  Filled 2016-06-25: qty 2

## 2016-06-25 NOTE — Progress Notes (Signed)
Subjective:   Seen and examined on hemodialysis. 2K bath  Objective:  Vital signs in last 24 hours:  Temp:  [98.3 F (36.8 C)] 98.3 F (36.8 C) (07/31 1035) Pulse Rate:  [83-105] 103 (07/31 1100) Resp:  [11-23] 12 (07/31 1100) BP: (104-182)/(62-100) 180/98 (07/31 1100) SpO2:  [98 %-100 %] 100 % (07/31 1100) Weight:  [82 kg (180 lb 12.4 oz)] 82 kg (180 lb 12.4 oz) (07/31 1035)  Weight change:  Filed Weights   06/22/16 0940 06/22/16 1342 06/25/16 1035  Weight: 81.3 kg (179 lb 3.7 oz) 79.3 kg (174 lb 13.2 oz) 82 kg (180 lb 12.4 oz)    Intake/Output:   No intake or output data in the 24 hours ending 06/25/16 1133   Physical Exam: General: NAD, laying in bed  HEENT anicteric  Neck supple  Pulm/lungs Clear b/l, normal effort  CVS/Heart Regular, no rubs  Abdomen:  Soft, NT  Extremities: No edema  Neurologic: Alert, able to answer questions  Skin: No acute rashes  Access: Left arm AV graft       Basic Metabolic Panel:   Recent Labs Lab 06/22/16 0950 06/25/16 0628  NA 138 136  K 5.5* 6.9*  CL 97* 96*  CO2 33* 32  GLUCOSE 184* 151*  BUN 41* 47*  CREATININE 8.05* 8.73*  CALCIUM 9.2 9.2  PHOS 3.9  --      CBC:  Recent Labs Lab 06/22/16 0950  WBC 3.7  HGB 8.1*  HCT 24.1*  MCV 98.0  PLT 81*      Microbiology:  Recent Results (from the past 720 hour(s))  Culture, blood (routine x 2)     Status: None   Collection Time: 06/15/16  4:20 PM  Result Value Ref Range Status   Specimen Description BLOOD ARTERIAL LINE  Final   Special Requests   Final    BOTTLES DRAWN AEROBIC AND ANAEROBIC  AER [place] ANA 5CC   Culture NO GROWTH 5 DAYS  Final   Report Status 06/20/2016 FINAL  Final  Culture, blood (routine x 2)     Status: None   Collection Time: 06/15/16  4:20 PM  Result Value Ref Range Status   Specimen Description BLOOD DRAWN BY DIALYSIS  Final   Special Requests BOTTLES DRAWN AEROBIC AND ANAEROBIC  5CC  Final   Culture NO GROWTH 5 DAYS  Final   Report Status 06/20/2016 FINAL  Final    Coagulation Studies: No results for input(s): LABPROT, INR in the last 72 hours.  Urinalysis: No results for input(s): COLORURINE, LABSPEC, PHURINE, GLUCOSEU, HGBUR, BILIRUBINUR, KETONESUR, PROTEINUR, UROBILINOGEN, NITRITE, LEUKOCYTESUR in the last 72 hours.  Invalid input(s): APPERANCEUR    Imaging: No results found.   Medications:     . amLODipine  10 mg Oral Daily  . aspirin  81 mg Oral Daily  . cloNIDine  0.3 mg Oral BID  . divalproex  1,000 mg Oral QHS  . epoetin (EPOGEN/PROCRIT) injection  10,000 Units Intravenous Q M,W,F-HD  . escitalopram  15 mg Oral Daily  . hydrALAZINE  100 mg Oral TID  . insulin aspart  0-15 Units Subcutaneous TID WC  . insulin aspart  0-5 Units Subcutaneous QHS  . metoprolol tartrate  50 mg Oral BID  . QUEtiapine  200 mg Oral BID  . traZODone  100 mg Oral QHS     Assessment/ Plan:  35 y.o. female with pmhx of ESRD, AOCD, SHPTH, HTN, hx of AVF, complex mood disorder, mild cognitive impairment, diabetes mellitus,  hx of recurrent staph bacteremia, LUE AVG placed 04/22/15.  1. ESRD on HD MWF/ [address] [facility]: seen and examined on hemodialysis. Hyperkalemia with 6.9 - 2 K bath - Continue MWF schedule  2. Anemia of CKD:  - Continue Epogen 10,000 units IV with dialysis..   3. SHPTH: phos at goal - not at goal.   4. Hypertension: elevated.  - clonidine, hydralazine, amlodipine, and metoprolol.    LOSJuleen China, Lurena Nida 7/31/201711:33 AM

## 2016-06-25 NOTE — ED Notes (Addendum)
Pt transported to dialysis by ED tech, report given to dialysis

## 2016-06-25 NOTE — Progress Notes (Signed)
Start of HD

## 2016-06-25 NOTE — Progress Notes (Signed)
Patient for shuntogram with Dr. Delana Meyer tomorrow (06/26/16).

## 2016-06-25 NOTE — Progress Notes (Signed)
HD tx end  

## 2016-06-25 NOTE — Progress Notes (Addendum)
CSW contacted Jetty Peeks from [facility] [phone] to inquire if staff was still coming to assess pt. No answer, left a message.  [name], MSW, [place]

## 2016-06-25 NOTE — ED Notes (Signed)
Pt resting in bed, given lunch tray, warm blanket given, resp even and unlabored

## 2016-06-25 NOTE — Progress Notes (Signed)
POST HD VITALS

## 2016-06-25 NOTE — ED Notes (Signed)
Pt awake and alert, pt up and down from bed to walking in hallway, pt given juice and a warm blanket

## 2016-06-25 NOTE — Progress Notes (Signed)
CSW contacted Jetty Peeks from [place] [place] [phone]. There is an available female bed at [facility] that may be appropriate for pt. CSW asked if Rojelio Brenner would be willing to extend a bed offer to the pt. Misty expressed some concerns about pt's behaviors and stated that she is still discussing pt's case with her supervisor to make a final decision. If ALF decides to move forward with placement, Misty or one of her colleagues will come to the ED this afternoon to assess pt. The impressions of the assessment will determine wether or not ALF will accept pt. CSW will continue to be in contact with Misty. If assessment is done today, CSW will be present for the duration.  [name], MSW, [place]

## 2016-06-25 NOTE — Progress Notes (Signed)
POST HD ASSESSMENT

## 2016-06-25 NOTE — ED Notes (Signed)
Pt returned from dialysis

## 2016-06-25 NOTE — ED Provider Notes (Signed)
-----------------------------------------   7:59 AM on 06/25/2016 -----------------------------------------   Blood pressure 104/62, pulse 83, temperature 98.3 F (36.8 C), temperature source Oral, resp. rate 20, height 5\' 2"  (1.575 m), weight 174 lb 13.2 oz (79.3 kg), SpO2 100 %.  The patient's potassium is elevated to 6.9. Nephrology has been consulted who recommends dialyzing the patient today in the emergency department. Possible bed at [facility], we will have social work see today and hopefully have a disposition.    Harvest Dark, MD 06/25/16 0800

## 2016-06-25 NOTE — ED Notes (Signed)
Attempted to get blood using finger stick, unable to get sufficient amount for analysis.  Called lab, they stated they could send someone at 6 am.

## 2016-06-25 NOTE — Progress Notes (Signed)
PRE HD ASSESSMENT 

## 2016-06-25 NOTE — Progress Notes (Addendum)
CSW was contacted by Dorthea Cove from [facility] [phone]. Facility has an available bed and may be an appropriate placement for pt. CSW sent FL2 and will await a call back from [place] about a decision.   [name], MSW, [place]

## 2016-06-25 NOTE — ED Notes (Signed)
Pt had BM in room on floor, pt scrubs changed and pt given wipes to clean up

## 2016-06-25 NOTE — ED Notes (Signed)
Dr. Edd Fabian notified of critical k-dur

## 2016-06-25 NOTE — ED Provider Notes (Addendum)
-----------------------------------------   7:29 AM on 06/25/2016 -----------------------------------------  Received phone call from the lab that the patient's potassium is 6.9 at this time. No acute hyperkalemic EKG changes. Unable to obtain IV access. At this time she likely does meet inpatient criteria for hyperkalemia. I discussed the case with Dr. Sherlon Handing who will arrange emergent dialysis.   ED ECG REPORT I, Joanne Gavel, the attending physician, personally viewed and interpreted this ECG.   Date: 06/25/2016  EKG Time: 07:19  Rate: 85  Rhythm: normal sinus rhythm  Axis: normal  Intervals:none  ST&T Change: No acute ST elevation or acute ST depression. No acute hyperkalemic EKG changes.    Joanne Gavel, MD 06/25/16 [place], MD 06/25/16 [phone]

## 2016-06-25 NOTE — ED Notes (Addendum)
Attempted to call Guardian but call went to voicemail. RN will continue to call and attempt to gain consent.

## 2016-06-25 NOTE — ED Notes (Signed)
Pt ate 100% of breakfast tray.

## 2016-06-25 NOTE — ED Notes (Addendum)
RN attempted to call guardian again in order to obtain informed consent. Call went to voicemail.

## 2016-06-25 NOTE — ED Notes (Signed)
Called dietary for pts tray, awaiting tray, pt resting in bed in no distress

## 2016-06-25 NOTE — ED Notes (Signed)
Pt resting in bed, eyes closed, resp even and unlabored

## 2016-06-25 NOTE — ED Notes (Signed)
Attempted to call legal guardian for consent, no answer, message left, awaiting call back

## 2016-06-25 NOTE — ED Notes (Signed)
Attempted straight stick on pt. Unsuccessful.

## 2016-06-25 NOTE — Progress Notes (Signed)
Pre HD vitals. 

## 2016-06-26 ENCOUNTER — Encounter
Admission: EM | Disposition: A | Discharge status: Home / Self Care | Payer: Self-pay | Source: Home / Self Care | Attending: Emergency Medicine

## 2016-06-26 HISTORY — PX: PERIPHERAL VASCULAR CATHETERIZATION: SHX172C

## 2016-06-26 LAB — BASIC METABOLIC PANEL
ANION GAP: 7 (ref 5–15)
BUN: 30 mg/dL — AB (ref 6–20)
CHLORIDE: 97 mmol/L — AB (ref 101–111)
CO2: 35 mmol/L — ABNORMAL HIGH (ref 22–32)
Calcium: 9.2 mg/dL (ref 8.9–10.3)
Creatinine, Ser: 6.12 mg/dL — ABNORMAL HIGH (ref 0.44–1.00)
GFR, EST AFRICAN AMERICAN: 9 mL/min — AB (ref >60)
GFR, EST NON AFRICAN AMERICAN: 8 mL/min — AB (ref >60)
Glucose, Bld: 161 mg/dL — ABNORMAL HIGH (ref 65–99)
POTASSIUM: 5.8 mmol/L — AB (ref 3.5–5.1)
SODIUM: 139 mmol/L (ref 135–145)

## 2016-06-26 LAB — GLUCOSE, CAPILLARY
GLUCOSE-CAPILLARY: 175 mg/dL — AB (ref 65–99)
GLUCOSE-CAPILLARY: 134 mg/dL — AB (ref 65–99)
GLUCOSE-CAPILLARY: 132 mg/dL — AB (ref 65–99)
Glucose-Capillary: 251 mg/dL — ABNORMAL HIGH (ref 65–99)

## 2016-06-26 SURGERY — A/V SHUNTOGRAM/FISTULAGRAM
Anesthesia: Moderate Sedation

## 2016-06-26 MED ORDER — CLONIDINE HCL 0.1 MG PO TABS
ORAL_TABLET | ORAL | Status: AC
  Filled 2016-06-26: qty 3

## 2016-06-26 MED ORDER — MIDAZOLAM HCL 5 MG/5ML IJ SOLN
INTRAMUSCULAR | Status: AC
  Filled 2016-06-26: qty 5

## 2016-06-26 MED ORDER — FENTANYL CITRATE (PF) 100 MCG/2ML IJ SOLN
INTRAMUSCULAR | Status: AC
  Filled 2016-06-26: qty 2

## 2016-06-26 MED ORDER — ESCITALOPRAM OXALATE 10 MG PO TABS
ORAL_TABLET | ORAL | Status: AC
  Filled 2016-06-26: qty 2

## 2016-06-26 MED ORDER — MIDAZOLAM HCL 2 MG/2ML IJ SOLN
INTRAMUSCULAR | Status: DC | PRN
  Administered 2016-06-26: 2 mg via INTRAVENOUS

## 2016-06-26 MED ORDER — ASPIRIN 81 MG PO CHEW
CHEWABLE_TABLET | ORAL | Status: AC
  Filled 2016-06-26: qty 1

## 2016-06-26 MED ORDER — LIDOCAINE HCL (PF) 1 % IJ SOLN
INTRAMUSCULAR | Status: AC
  Filled 2016-06-26: qty 30

## 2016-06-26 MED ORDER — AMLODIPINE BESYLATE 5 MG PO TABS
ORAL_TABLET | ORAL | Status: AC
  Filled 2016-06-26: qty 2

## 2016-06-26 MED ORDER — FENTANYL CITRATE (PF) 100 MCG/2ML IJ SOLN
INTRAMUSCULAR | Status: DC | PRN
  Administered 2016-06-26: 100 ug via INTRAVENOUS

## 2016-06-26 MED ORDER — QUETIAPINE FUMARATE 200 MG PO TABS
ORAL_TABLET | ORAL | Status: AC
  Filled 2016-06-26: qty 1

## 2016-06-26 MED ORDER — IOPAMIDOL (ISOVUE-300) INJECTION 61%
INTRAVENOUS | Status: DC | PRN
  Administered 2016-06-26: 20 mL via INTRAVENOUS

## 2016-06-26 MED ORDER — METOPROLOL TARTRATE 50 MG PO TABS
ORAL_TABLET | ORAL | Status: AC
  Filled 2016-06-26: qty 1

## 2016-06-26 SURGICAL SUPPLY — 15 items
BALLN ULTRVRSE 8X60X75C (BALLOONS) ×6
BALLOON ULTRVRSE 8X60X75C (BALLOONS) ×2 IMPLANT
CATH TORCON 5FR 0.38 (CATHETERS) ×3 IMPLANT
DEVICE PRESTO INFLATION (MISCELLANEOUS) ×3 IMPLANT
DRAPE BRACHIAL (DRAPES) ×3 IMPLANT
PACK ANGIOGRAPHY (CUSTOM PROCEDURE TRAY) ×3 IMPLANT
SET INTRO CAPELLA COAXIAL (SET/KITS/TRAYS/PACK) ×3 IMPLANT
SHEATH 9FRX11 (SHEATH) ×3 IMPLANT
SHEATH BRITE TIP 6FRX5.5 (SHEATH) ×3 IMPLANT
SHEATH BRITE TIP 7FRX5.5 (SHEATH) ×3 IMPLANT
SHEATH BRITE TIP 8FRX11 (SHEATH) ×3 IMPLANT
STENT FLUENCY 9X60X80 (Permanent Stent) ×3 IMPLANT
TOWEL OR 17X26 4PK STRL BLUE (TOWEL DISPOSABLE) ×3 IMPLANT
WIRE AMPLATZ SSTIFF .035X260CM (WIRE) ×3 IMPLANT
WIRE ROSEN-J .035X260CM (WIRE) ×3 IMPLANT

## 2016-06-26 NOTE — ED Notes (Signed)
Left message for guardian that it is very important that she return call.

## 2016-06-26 NOTE — ED Notes (Signed)
CBG 175. 
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
## 2016-06-26 NOTE — ED Notes (Signed)
Pt informed no food or drink due to procedure being done later today.

## 2016-06-26 NOTE — ED Notes (Signed)
Pt given medication with 2 sips of water.

## 2016-06-26 NOTE — ED Notes (Signed)
Pt sleeping; still drowsy from procedure

## 2016-06-26 NOTE — ED Provider Notes (Signed)
-----------------------------------------   8:24 AM on 06/26/2016 -----------------------------------------   Blood pressure (!) 173/89, pulse 91, temperature 97.5 F (36.4 C), temperature source Oral, resp. rate 20, height 5\' 2"  (1.575 m), weight 173 lb 1 oz (78.5 kg), SpO2 99 %.  The patient had no acute events since last update.  Calm and cooperative at this time.  The patient is awaiting placement through social work. The patient did have an elevated potassium yesterday morning and dialysis was recommended. The patient's potassium this morning is 5.8.     Loney Hering, MD 06/26/16 [phone]

## 2016-06-26 NOTE — ED Notes (Signed)
Gave report to specials nurse; pt to go to room 3

## 2016-06-26 NOTE — ED Notes (Signed)
Pt returned from specials.  

## 2016-06-26 NOTE — Progress Notes (Signed)
CSW contacted Jetty Peeks [phone] to ask for an update about where facility was with assessing and accepting pt. Misty informed CSW that she believes pt is too medically acute for ALF level of care. She also is concerned about pt's behaviors. For these reasons [facility] will be unable to accept pt.   [name], MSW, [place]

## 2016-06-26 NOTE — ED Notes (Signed)
Report given [facility]

## 2016-06-26 NOTE — ED Notes (Signed)
CBG 134, RN notified

## 2016-06-26 NOTE — Consult Note (Signed)
[facility] Consult Note  MRN : [id number]  Kelly [name] is a 35 y.o. (09-08-1981) female who presents with chief complaint of  Chief Complaint  Patient presents with  . Suicidal  . Homicidal  .  History of Present Illness: The patient has been admitted to the behavioral medicine service for suicidal and homicidal ideations. While in hospital she was noted to have severe bleeding post dialysis runs from her cannulation site. At this point she has not missed any dialysis runs however this is an increasing problem with her AV access. There is no documented fever chills. There is no drainage. Patient has sustained several moderate sized hematomas.  The patient is currently maintained by a left arm hero graft.  Current Facility-Administered Medications  Medication Dose Route Frequency Provider Last Rate Last Dose  . [MAR Hold] amLODipine (NORVASC) tablet 10 mg  10 mg Oral Daily Munsoor Lateef, MD   10 mg at 06/26/16 1031  . aspirin 81 MG chewable tablet           . [MAR Hold] aspirin chewable tablet 81 mg  81 mg Oral Daily Delman Kitten, MD   81 mg at 06/26/16 1030  . [MAR Hold] ceFAZolin (ANCEF) IVPB 1 g/50 mL premix  1 g Intravenous On Call [employer], PA-C      . [MAR Hold] cloNIDine (CATAPRES) tablet 0.3 mg  0.3 mg Oral BID Munsoor Lateef, MD   0.3 mg at 06/26/16 1027  . [MAR Hold] divalproex (DEPAKOTE) DR tablet 1,000 mg  1,000 mg Oral QHS Delman Kitten, MD   1,000 mg at 06/25/16 2147  . [MAR Hold] epoetin alfa (EPOGEN,PROCRIT) injection 10,000 Units  10,000 Units Intravenous Q M,W,F-HD Munsoor Lateef, MD   10,000 Units at 06/25/16 1152  . [MAR Hold] escitalopram (LEXAPRO) tablet 15 mg  15 mg Oral Daily Delman Kitten, MD   15 mg at 06/26/16 1028  . fentaNYL (SUBLIMAZE) injection    PRN Katha Cabal, MD   100 mcg at 06/26/16 1546  . [MAR Hold] hydrALAZINE (APRESOLINE) tablet 100 mg  100 mg Oral TID Munsoor Lateef, MD   100 mg at 06/25/16 2147  .  [MAR Hold] insulin aspart (novoLOG) injection 0-15 Units  0-15 Units Subcutaneous TID WC Drenda Freeze, MD   3 Units at 06/25/16 1712  . [MAR Hold] insulin aspart (novoLOG) injection 0-5 Units  0-5 Units Subcutaneous QHS Drenda Freeze, MD   2 Units at 06/22/16 2157  . metoprolol (LOPRESSOR) 50 MG tablet           . [MAR Hold] metoprolol (LOPRESSOR) tablet 50 mg  50 mg Oral BID Drenda Freeze, MD   50 mg at 06/26/16 1030  . midazolam (VERSED) injection    PRN Katha Cabal, MD   2 mg at 06/26/16 1546  . QUEtiapine (SEROQUEL) 200 MG tablet           . [MAR Hold] QUEtiapine (SEROQUEL) tablet 200 mg  200 mg Oral BID Delman Kitten, MD   200 mg at 06/26/16 1031  . [MAR Hold] traZODone (DESYREL) tablet 100 mg  100 mg Oral QHS Drenda Freeze, MD   100 mg at 06/25/16 2147    Past Medical History:  Diagnosis Date  . Alcohol abuse    chronic  . Bipolar disorder ([place])   . Chronic kidney disease   . Cognitive changes   . Cognitive impairment   . Depression   .  Depression   . Diabetes mellitus without complication ([place])   . Elevated lipids   . Hyperparathyroidism ([place])   . Hypertension   . Staph aureus infection    A/V fistula  . UTI (lower urinary tract infection)     Past Surgical History:  Procedure Laterality Date  . AV FISTULA PLACEMENT    . CHOLECYSTECTOMY    . dialysis perma cath Right    chest  . PERIPHERAL VASCULAR CATHETERIZATION N/A 05/24/2015   Procedure: Dialysis/Perma Catheter Removal;  Surgeon: Katha Cabal, MD;  Location: [facility];  Service: Cardiovascular;  Laterality: N/A;  . PERIPHERAL VASCULAR CATHETERIZATION Left 02/28/2016   Procedure: A/V Shuntogram/Fistulagram;  Surgeon: Katha Cabal, MD;  Location: [facility];  Service: Cardiovascular;  Laterality: Left;  . PERIPHERAL VASCULAR CATHETERIZATION N/A 02/28/2016   Procedure: A/V Shunt Intervention;  Surgeon: Katha Cabal, MD;  Location: [facility];  Service:  Cardiovascular;  Laterality: N/A;  . VASCULAR ACCESS DEVICE INSERTION Left 04/22/2015   Procedure: INSERTION OF HERO VASCULAR ACCESS DEVICE;  Surgeon: Katha Cabal, MD;  Location: [facility];  Service: Vascular;  Laterality: Left;    Social History Social History  Substance Use Topics  . Smoking status: Current Every Day Smoker    Packs/day: 0.25    Years: 0.00    Types: Cigarettes  . Smokeless tobacco: Current User  . Alcohol use No    Family History History reviewed. No pertinent family history.No family history of bleeding clotting disorders porphyria or autoimmune disease.  No Known Allergies   REVIEW OF SYSTEMS (Negative unless checked)  Constitutional: [] Weight loss  [] Fever  [] Chills Cardiac: [] Chest pain   [] Chest pressure   [] Palpitations   [] Shortness of breath when laying flat   [] Shortness of breath at rest   [] Shortness of breath with exertion. Vascular:  [] Pain in legs with walking   [] Pain in legs at rest   [] Pain in legs when laying flat   [] Claudication   [] Pain in feet when walking  [] Pain in feet at rest  [] Pain in feet when laying flat   [] History of DVT   [] Phlebitis   [] Swelling in legs   [] Varicose veins   [] Non-healing ulcers Pulmonary:   [] Uses home oxygen   [] Productive cough   [] Hemoptysis   [] Wheeze  [] COPD   [] Asthma Neurologic:  [] Dizziness  [] Blackouts   [] Seizures   [] History of stroke   [] History of TIA  [] Aphasia   [] Temporary blindness   [] Dysphagia   [] Weakness or numbness in arms   [] Weakness or numbness in legs Musculoskeletal:  [] Arthritis   [] Joint swelling   [] Joint pain   [] Low back pain Hematologic:  [] Easy bruising  [] Easy bleeding   [] Hypercoagulable state   [] Anemic  [] Hepatitis Gastrointestinal:  [] Blood in stool   [] Vomiting blood  [] Gastroesophageal reflux/heartburn   [] Difficulty swallowing. Genitourinary:  [] Chronic kidney disease   [] Difficult urination  [] Frequent urination  [] Burning with urination   [] Blood in urine Skin:   [] Rashes   [] Ulcers   [] Wounds Psychological:  [x] History of anxiety   [x]  History of major depression.  Physical Examination  Vitals:   06/25/16 1355 06/25/16 2144 06/26/16 0619 06/26/16 1413  BP: (!) 190/75 (!) 177/83 (!) 173/89 (!) 147/100  Pulse: (!) 111 99 91 90  Resp: 20 18 20 18   Temp:  98 F (36.7 C) 97.5 F (36.4 C) 97.6 F (36.4 C)  TempSrc:  Oral Oral Oral  SpO2: 100% 98%  99% 99%  Weight:      Height:       Body mass index is 31.65 kg/m. Gen:  WD/WN, NAD Head: [place]/AT, No temporalis wasting. Prominent temp pulse not noted. Ear/Nose/Throat: Hearing grossly intact, nares w/o erythema or drainage, oropharynx w/o Erythema/Exudate Eyes: PERRLA, EOMI.  Neck: Supple, no nuchal rigidity.  No bruit or JVD.  Pulmonary:  Good air movement, clear to auscultation bilaterally.  Cardiac: RRR, normal S1, S2, no Murmurs, rubs or gallops. Vascular: Left arm AV access moderate pulsatility good thrill extensive scar from multiple grafts  Gastrointestinal: soft, non-tender/non-distended. No guarding/reflex. No masses, surgical incisions, or scars. Musculoskeletal: M/S 5/5 throughout.  Extremities without ischemic changes.  No deformity or atrophy. No edema. Neurologic: CN 2-12 intact. Pain and light touch intact in extremities.  Symmetrical.  Speech is fluent. Motor exam as listed above. Psychiatric: Judgment intact, Mood & affect appropriate for pt's clinical situation. Dermatologic: No rashes or ulcers noted.  No cellulitis or open wounds. Lymph : No Cervical, Axillary, or Inguinal lymphadenopathy.   CBC Lab Results  Component Value Date   WBC 3.7 06/22/2016   HGB 8.1 (L) 06/22/2016   HCT 24.1 (L) 06/22/2016   MCV 98.0 06/22/2016   PLT 81 (L) 06/22/2016    BMET    Component Value Date/Time   NA 139 06/26/2016 0551   NA 135 (L) 12/24/2014 0954   K 5.8 (H) 06/26/2016 0551   K 6.3 (H) 12/24/2014 0954   CL 97 (L) 06/26/2016 0551   CL 100 12/24/2014 0954   CO2 35 (H)  06/26/2016 0551   CO2 28 12/24/2014 0954   GLUCOSE 161 (H) 06/26/2016 0551   GLUCOSE 138 (H) 12/24/2014 0954   BUN 30 (H) 06/26/2016 0551   BUN 75 (H) 12/24/2014 0954   CREATININE 6.12 (H) 06/26/2016 0551   CREATININE 8.81 (H) 12/24/2014 0954   CALCIUM 9.2 06/26/2016 0551   CALCIUM 8.6 12/24/2014 0954   GFRNONAA 8 (L) 06/26/2016 0551   GFRNONAA 6 (L) 12/24/2014 0954   GFRNONAA 3 (L) 08/02/2014 1150   GFRAA 9 (L) 06/26/2016 0551   GFRAA 7 (L) 12/24/2014 0954   GFRAA 4 (L) 08/02/2014 1150   Estimated Creatinine Clearance (by C-G formula based on SCr of 6.12 mg/dL) Female: 12.6 mL/min Female: 15.4 mL/min  COAG Lab Results  Component Value Date   INR 1.00 04/19/2015    Radiology No results found.  Assessment/Plan 1.  Complication dialysis device with prolonged bleeding AV access:  Patient's left arm hero dialysis access is poorly functioning. The patient will undergo angiography with repair using interventional techniques. Potassium will be drawn to ensure that it is an appropriate level prior to performing thrombectomy. 2.  End-stage renal disease requiring hemodialysis:  Patient will continue dialysis therapy without further interruption if a successful thrombectomy is not achieved then catheter will be placed. Dialysis has already been arranged since the patient missed their previous session 3.  Hypertension:  Patient will continue medical management; nephrology is following no changes in oral medications. 4. Diabetes mellitus:  Glucose will be monitored and oral medications been held this morning once the patient has undergone the patient's procedure po intake will be reinitiated and again Accu-Cheks will be used to assess the blood glucose level and treat as needed. The patient will be restarted on the patient's usual hypoglycemic regime 5.  Schizophrenia with suicidal and homicidal ideations:  Once the peak procedure is completed she'll be returned to the behavioral medicine  ward.  Schnier, Dolores Lory, MD  06/26/2016 3:47 PM

## 2016-06-26 NOTE — ED Notes (Signed)
Attempted to call legal guardian unsuccess Kelly [name].

## 2016-06-26 NOTE — Op Note (Signed)
OPERATIVE NOTE   PROCEDURE: 1. Contrast injection left arm hero  AV access 2. Percutaneous transluminal angioplasty and stent placement left arm hero graft  PRE-OPERATIVE DIAGNOSIS: Complication of dialysis access                                                       End Stage Renal Disease  POST-OPERATIVE DIAGNOSIS: same as above   SURGEON: Katha Cabal, M.D.  ANESTHESIA: Conscious sedation was administered under my direct supervision. IV Versed plus fentanyl were utilized. Continuous ECG, pulse oximetry and blood pressure was monitored throughout the entire procedure.  Conscious sedation was for a total of 45.  ESTIMATED BLOOD LOSS: minimal  FINDING(S): 1. Stricture with pseudoaneurysm formation the leading edge of the stent which is been deformed by repeated access  SPECIMEN(S):  None  CONTRAST: 20 cc  FLUOROSCOPY TIME: 2.4 minutes  INDICATIONS: Kelly [name] is a 35 y.o. female who  presents with malfunctioning left arm hero AV access.  The patient is scheduled for angiography with possible intervention of the AV access.  The patient is aware the risks include but are not limited to: bleeding, infection, thrombosis of the cannulated access, and possible anaphylactic reaction to the contrast.  The patient acknowledges if the access can not be salvaged a tunneled catheter will be needed and will be placed during this procedure.  The patient is aware of the risks of the procedure and elects to proceed with the angiogram and intervention.  DESCRIPTION: After full informed written consent was obtained, the patient was brought back to the Special Procedure suite and placed supine position.  Appropriate cardiopulmonary monitors were placed.  The left arm was prepped and draped in the standard fashion.  Appropriate timeout is called. The left hero  was cannulated with a micropuncture needle.  The microwire was advanced and the needle was exchanged for  a microsheath.  The J-wire  was then advanced and a 6 Fr sheath inserted.  Hand injections were completed to image the access from the arterial anastomosis through the entire access.  The central venous structures were also imaged by hand injections.  Based on the images,  3000 units of heparin was given and a wire was negotiated through the strictures within the venous portion of the graft.  Attempts at were made at advancing a 9 x 60 fluency however would not track over the wire and ultimately we upsized to a 9 [place] sheath through which the fluency was able to be advanced. It was then deployed across the leading edge sealing and excluding the pseudoaneurysm and covering over the deformed segment of stenting. It was postdilated using  8 mm x 60 mmultra versed balloon inflated to 12 atmospheres  Follow-up imaging demonstrates complete resolution of the stricture with rapid flow of contrast through the graft, the central venous anatomy is preserved.  A 4-0 Monocryl purse-string suture was sewn around the sheath.  The sheath was removed and light pressure was applied.  A sterile bandage was applied to the puncture site.    COMPLICATIONS: None  CONDITION: [name], M.D [facility] Office: [phone]  06/26/2016 4:48 PM

## 2016-06-26 NOTE — ED Notes (Signed)
Report from Shannon RN.

## 2016-06-26 NOTE — Progress Notes (Signed)
CSW contacted [facility] [phone] to inquire about a possible bed for pt. No answer, left message.

## 2016-06-26 NOTE — ED Notes (Signed)
Called vascular surgery to determine when pt will have procedure (shuntogram); Informed that it will be approximately 2PM.

## 2016-06-26 NOTE — ED Notes (Signed)
Pt refused chicken sandwich; kept her orange and cookies

## 2016-06-27 ENCOUNTER — Encounter: Payer: Self-pay | Admitting: Vascular Surgery

## 2016-06-27 LAB — RENAL FUNCTION PANEL
ALBUMIN: 3.2 g/dL — AB (ref 3.5–5.0)
ANION GAP: 10 (ref 5–15)
Albumin: 3.5 g/dL (ref 3.5–5.0)
Anion gap: 10 (ref 5–15)
BUN: 39 mg/dL — ABNORMAL HIGH (ref 6–20)
BUN: 42 mg/dL — ABNORMAL HIGH (ref 6–20)
CALCIUM: 9.5 mg/dL (ref 8.9–10.3)
CALCIUM: 9.1 mg/dL (ref 8.9–10.3)
CHLORIDE: 94 mmol/L — AB (ref 101–111)
CO2: 33 mmol/L — AB (ref 22–32)
CO2: 30 mmol/L (ref 22–32)
Chloride: 96 mmol/L — ABNORMAL LOW (ref 101–111)
Creatinine, Ser: 8.39 mg/dL — ABNORMAL HIGH (ref 0.44–1.00)
Creatinine, Ser: 8.55 mg/dL — ABNORMAL HIGH (ref 0.44–1.00)
GFR calc non Af Amer: 6 mL/min — ABNORMAL LOW (ref >60)
GFR, EST AFRICAN AMERICAN: 6 mL/min — AB (ref >60)
GFR, EST AFRICAN AMERICAN: 6 mL/min — AB (ref >60)
GFR, EST NON AFRICAN AMERICAN: 5 mL/min — AB (ref >60)
GLUCOSE: 156 mg/dL — AB (ref 65–99)
GLUCOSE: 138 mg/dL — AB (ref 65–99)
POTASSIUM: 5.8 mmol/L — AB (ref 3.5–5.1)
Phosphorus: 4.3 mg/dL (ref 2.5–4.6)
Phosphorus: 3.7 mg/dL (ref 2.5–4.6)
Potassium: 5.8 mmol/L — ABNORMAL HIGH (ref 3.5–5.1)
Sodium: 137 mmol/L (ref 135–145)
Sodium: 136 mmol/L (ref 135–145)

## 2016-06-27 LAB — CBC
HEMATOCRIT: 25.7 % — AB (ref 35.0–47.0)
HEMOGLOBIN: 8.6 g/dL — AB (ref 12.0–16.0)
MCH: 33 pg (ref 26.0–34.0)
MCHC: 33.5 g/dL (ref 32.0–36.0)
MCV: 98.6 fL (ref 80.0–100.0)
Platelets: 145 10*3/uL — ABNORMAL LOW (ref 150–440)
RBC: 2.61 MIL/uL — AB (ref 3.80–5.20)
RDW: 13.9 % (ref 11.5–14.5)
WBC: 4.8 10*3/uL (ref 3.6–11.0)

## 2016-06-27 LAB — GLUCOSE, CAPILLARY
GLUCOSE-CAPILLARY: 134 mg/dL — AB (ref 65–99)
GLUCOSE-CAPILLARY: 63 mg/dL — AB (ref 65–99)
GLUCOSE-CAPILLARY: 72 mg/dL (ref 65–99)
Glucose-Capillary: 133 mg/dL — ABNORMAL HIGH (ref 65–99)
Glucose-Capillary: 163 mg/dL — ABNORMAL HIGH (ref 65–99)
Glucose-Capillary: 165 mg/dL — ABNORMAL HIGH (ref 65–99)
Glucose-Capillary: 241 mg/dL — ABNORMAL HIGH (ref 65–99)

## 2016-06-27 LAB — PHOSPHORUS: Phosphorus: 4.2 mg/dL (ref 2.5–4.6)

## 2016-06-27 NOTE — Progress Notes (Signed)
Dialysis complete

## 2016-06-27 NOTE — ED Notes (Signed)
Pt given food tray and drink.  

## 2016-06-27 NOTE — Progress Notes (Signed)
HD TX started  

## 2016-06-27 NOTE — Progress Notes (Signed)
Pre HD assessment  

## 2016-06-27 NOTE — ED Notes (Signed)
Tech walked pt to shower and back to room after showering.

## 2016-06-27 NOTE — Progress Notes (Signed)
CSW contacted Renaee Munda from [facility] [phone] for an update on pt's referral. Joen Laura states that she has had time to look over the referral however, she would like to touch base with pt's care coordinator [facility]. Ms. Mariea Clonts states that she called Ms. Hunt and did not receive an answer, but left a message.   Ms. Mariea Clonts expressed some concerns about pt's insulin and dialysis schedule. She is unsure if her facility is equipped to care for those medical needs. Ms. Mariea Clonts will continue to review pt's case and give CSW a decision once she has the opportunity to talk with Ms. Geoffry Paradise.   CSW will continue to follow pt and assist as needed.   [name], MSW, [place]

## 2016-06-27 NOTE — Progress Notes (Signed)
[facility]  ROUNDING NOTE   Subjective:   Seen and examined on hemodialysis. Tolerating treatment well. UF goal of 2 litres.  Angiogram yesterday and stent placed to Hero Graft  Objective:  Vital signs in last 24 hours:  Temp:  [97.6 F (36.4 C)-98.3 F (36.8 C)] 98.2 F (36.8 C) (08/02 1220) Pulse Rate:  [41-91] 80 (08/02 0613) Resp:  [14-18] 18 (08/02 0613) BP: (147-197)/(73-100) 169/73 (08/02 1220) SpO2:  [93 %-99 %] 98 % (08/02 1220) Weight:  [80 kg (176 lb 5.9 oz)] 80 kg (176 lb 5.9 oz) (08/02 1220)  Weight change:  Filed Weights   06/25/16 1035 06/25/16 1340 06/27/16 1220  Weight: 82 kg (180 lb 12.4 oz) 78.5 kg (173 lb 1 oz) 80 kg (176 lb 5.9 oz)    Intake/Output: No intake/output data recorded.   Intake/Output this shift:  No intake/output data recorded.  Physical Exam: General: NAD, sitting in chair  Head: Normocephalic, atraumatic. Moist oral mucosal membranes  Eyes: Anicteric, PERRL  Neck: Supple, trachea midline  Lungs:  Clear to auscultation  Heart: Regular rate and rhythm  Abdomen:  Soft, nontender,   Extremities:  no peripheral edema.  Neurologic: Nonfocal, moving all four extremities  Skin: No lesions  Access: Left arm AVG    Basic Metabolic Panel:  Recent Labs Lab 06/22/16 0950 06/25/16 0628 06/26/16 0551 06/27/16 0912  NA 138 136 139 137  K 5.5* 6.9* 5.8* 5.8*  CL 97* 96* 97* 94*  CO2 33* 32 35* 33*  GLUCOSE 184* 151* 161* 156*  BUN 41* 47* 30* 39*  CREATININE 8.05* 8.73* 6.12* 8.39*  CALCIUM 9.2 9.2 9.2 9.5  PHOS 3.9  --   --  4.2  4.3    Liver Function Tests:  Recent Labs Lab 06/22/16 0950 06/25/16 0628 06/27/16 0912  AST  --  13*  --   ALT  --  12*  --   ALKPHOS  --  50  --   BILITOT  --  0.4  --   PROT  --  6.2*  --   ALBUMIN 3.5 3.3* 3.5   No results for input(s): LIPASE, AMYLASE in the last 168 hours. No results for input(s): AMMONIA in the last 168 hours.  CBC:  Recent Labs Lab 06/22/16 0950  06/27/16 0912  WBC 3.7 4.8  HGB 8.1* 8.6*  HCT 24.1* 25.7*  MCV 98.0 98.6  PLT 81* 145*    Cardiac Enzymes: No results for input(s): CKTOTAL, CKMB, CKMBINDEX, TROPONINI in the last 168 hours.  BNP: Invalid input(s): POCBNP  CBG:  Recent Labs Lab 06/26/16 1934 06/27/16 0003 06/27/16 0612 06/27/16 0830 06/27/16 1158  GLUCAP 251* 165* 133* 134* 163*    Microbiology: Results for orders placed or performed during the hospital encounter of 06/11/16  Culture, blood (routine x 2)     Status: None   Collection Time: 06/15/16  4:20 PM  Result Value Ref Range Status   Specimen Description BLOOD ARTERIAL LINE  Final   Special Requests   Final    BOTTLES DRAWN AEROBIC AND ANAEROBIC  AER [place] ANA 5CC   Culture NO GROWTH 5 DAYS  Final   Report Status 06/20/2016 FINAL  Final  Culture, blood (routine x 2)     Status: None   Collection Time: 06/15/16  4:20 PM  Result Value Ref Range Status   Specimen Description BLOOD DRAWN BY DIALYSIS  Final   Special Requests BOTTLES DRAWN AEROBIC AND ANAEROBIC  5CC  Final  Culture NO GROWTH 5 DAYS  Final   Report Status 06/20/2016 FINAL  Final    Coagulation Studies: No results for input(s): LABPROT, INR in the last 72 hours.  Urinalysis: No results for input(s): COLORURINE, LABSPEC, PHURINE, GLUCOSEU, HGBUR, BILIRUBINUR, KETONESUR, PROTEINUR, UROBILINOGEN, NITRITE, LEUKOCYTESUR in the last 72 hours.  Invalid input(s): APPERANCEUR    Imaging: No results found.   Medications:     . amLODipine  10 mg Oral Daily  . aspirin  81 mg Oral Daily  . cloNIDine  0.3 mg Oral BID  . divalproex  1,000 mg Oral QHS  . epoetin (EPOGEN/PROCRIT) injection  10,000 Units Intravenous Q M,W,F-HD  . escitalopram  15 mg Oral Daily  . hydrALAZINE  100 mg Oral TID  . insulin aspart  0-15 Units Subcutaneous TID WC  . insulin aspart  0-5 Units Subcutaneous QHS  . metoprolol tartrate  50 mg Oral BID  . QUEtiapine  200 mg Oral BID  . traZODone  100 mg Oral  QHS     Assessment/ Plan:  [name] is a 35 y.o. black female with developmental delay, ESRD, AOCD, SHPTH, HTN, schizophrenia, mild cognitive impairment, diabetes mellitus type II  [facility] MWF [address]  1. ESRD on HD MWF: seen and examined on hemodialysis.  - Continue MWF schedule  2. Anemia of CKD:  - Continue Epogen 10,000 units IV with dialysis..   3. SHPTH: phosphorus at goal. Not at goal.   4. Hypertension: elevated.  - clonidine, hydralazine, amlodipine, and metoprolol.    LOS: [place], Banesa Tristan 8/2/20171:25 PM

## 2016-06-27 NOTE — ED Notes (Addendum)
Report received from [place], RN in dialysis. 2L's removed. VSS. Pt to be transported back to room

## 2016-06-27 NOTE — ED Notes (Signed)
Pt calm, pt offered sugar-free ice cream, pt accepted. Pt cooperative with night time meds.

## 2016-06-27 NOTE — ED Provider Notes (Signed)
-----------------------------------------   8:48 AM on 06/27/2016 -----------------------------------------   Blood pressure (!) 153/94, pulse 80, temperature 98.3 F (36.8 C), temperature source Oral, resp. rate 18, height 5\' 2"  (1.575 m), weight 173 lb 1 oz (78.5 kg), SpO2 96 %.  The patient had no acute events since last update.  Calm and cooperative at this time.  Went for graft revision with Dr. Delana Meyer.  Dialysis scheduled for Noon today.  No acute needs at this time.  Disposition is pending per Psychiatry/Behavioral Medicine team recommendations.     Merlyn Lot, MD 06/27/16 [phone]

## 2016-06-27 NOTE — Progress Notes (Signed)
Post dialysis 

## 2016-06-27 NOTE — Progress Notes (Signed)
PRE HD   

## 2016-06-27 NOTE — ED Notes (Signed)
Pt off the floor to dialysis at this time

## 2016-06-27 NOTE — ED Notes (Signed)
Report given to dialysis nurse shonderlla ingram. Pt to go to dialysis in next 15 minutes. Given lunch tray.

## 2016-06-27 NOTE — ED Notes (Signed)
Pt did not receive diet tray. Ordered from dietary.

## 2016-06-27 NOTE — Progress Notes (Signed)
CSW contacted pt's [facility] care coordinator Scarlette Shorts [phone]. CSW informed Ms. Hunt that pt has been both medically and psychiatrically cleared and that the pt has an emergent need to be placed so she is no longer just sitting in the ED. CSW let Ms. Hunt know that the Clinical Social Work department has been relentlessly searching for placement for pt to no avail. Ms. Geoffry Paradise expressed her understanding and states that she is willing to help in the process of placing pt. CSW inquired about additional funding that [facility] could provide to the pt to compensate a sitter, but Ms. Hunt states that no such funds are currently available. Ms. Geoffry Paradise suggest [facility] as a possible placement for pt as it is a locked facility and they would be equipped to care for pt's behaviors.   CSW contacted Renaee Munda with [facility] [phone]. CSW suggested a single case agreement which would allow additional funding for pt and compensate for the cost of the sitter. Ms. Mariea Clonts states that there may be a bed available for pt and facility may be able to meet pt's needs. CSW sent over [facility] and psychiatry notes. Awaiting response.   CSW will continue to follow pt and assist as needed.  [name], MSW, [place]

## 2016-06-27 NOTE — ED Notes (Signed)
Breakfast tray given. °

## 2016-06-27 NOTE — ED Notes (Signed)
Pt offered meal trey, pt refused. Pt cussing at RN for not having ham instead of a [place] sandwich. Pt given time to cool down.

## 2016-06-28 LAB — GLUCOSE, CAPILLARY
GLUCOSE-CAPILLARY: 97 mg/dL (ref 65–99)
Glucose-Capillary: 233 mg/dL — ABNORMAL HIGH (ref 65–99)
Glucose-Capillary: 76 mg/dL (ref 65–99)
Glucose-Capillary: 126 mg/dL — ABNORMAL HIGH (ref 65–99)
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
## 2016-06-28 NOTE — ED Provider Notes (Addendum)
-----------------------------------------   7:08 AM on 06/28/2016 -----------------------------------------  Blood pressure (!) 166/86, pulse 89, temperature 99.3 F (37.4 C), temperature source Oral, resp. rate 18, height 5\' 2"  (1.575 m), weight 172 lb 2.9 oz (78.1 kg), SpO2 96 %.  The patient had no acute events since last update.  Calm and cooperative at this time.  Disposition is pending per Psychiatry/Behavioral Medicine team recommendations. Patient undergoes Monday Wednesday Friday dialysis, fully dialyzed yesterday.      Joanne Gavel, MD 06/28/16 [place], MD 06/28/16 [phone]

## 2016-06-28 NOTE — ED Notes (Signed)
Patient blood sugar was 97 notified nurse Bill S.

## 2016-06-28 NOTE — Progress Notes (Signed)
CSW spoke with pt's legal guardian, Kelly [name] [phone]. CSW updated Kelly [name] on pt's situation and let her know that placement has still yet to be secured. Kelly [name] stated that the last she heard pt was being considered for [facility]. CSW informed her that pt was declined. Kelly [name] expressed her understanding and asked to be kept updated. CSW agreed.  Kelly [name], MSW, [place]

## 2016-06-28 NOTE — Progress Notes (Signed)
Spoke with pt's case coordinator Scarlette Shorts [phone] for an update on pt's case. Informed Ms. Hunt that [facility] sent referral for [facility] and is waiting to hear back. Renaee Munda from [facility] got in contact with Ms. Hunt and informed her that she will be attempting to pursue placement for pt. Joen Laura will file a single case agreement and seek additional funding for pt and try to get her approved by facility's board of directors. CSW will continue to be in contact. Weekend CSW notified.  Legal guardian Porfirio Mylar notified of update on pt placement.  [name], MSW, [place]

## 2016-06-28 NOTE — ED Notes (Signed)
Patient refused blood pressure check notified nurse Bill S.

## 2016-06-28 NOTE — Progress Notes (Signed)
CSW was contacted by Dr. Latanya Presser at [facility] [phone]. Dr. Latanya Presser requested an update on pt because he was unsure if pt's spot at dialysis should continue to be saved for pt. CSW informed Dr. Latanya Presser that social work, pt's legal guardian, [facility], and DSS are all working on placement for pt. However, pt has not been accepted anywhere yet. Dr. Latanya Presser states that pt's spot at dialysis can only be held for about 30-40 days. CSW expressed her understanding and agreed to keep him updated.  [name], MSW, [place]

## 2016-06-29 LAB — RENAL FUNCTION PANEL
ANION GAP: 9 (ref 5–15)
Albumin: 3.3 g/dL — ABNORMAL LOW (ref 3.5–5.0)
BUN: 36 mg/dL — ABNORMAL HIGH (ref 6–20)
CHLORIDE: 96 mmol/L — AB (ref 101–111)
CO2: 32 mmol/L (ref 22–32)
CREATININE: 8.01 mg/dL — AB (ref 0.44–1.00)
Calcium: 9.4 mg/dL (ref 8.9–10.3)
GFR, EST AFRICAN AMERICAN: 7 mL/min — AB (ref >60)
GFR, EST NON AFRICAN AMERICAN: 6 mL/min — AB (ref >60)
Glucose, Bld: 141 mg/dL — ABNORMAL HIGH (ref 65–99)
Phosphorus: 3.5 mg/dL (ref 2.5–4.6)
Potassium: 5.7 mmol/L — ABNORMAL HIGH (ref 3.5–5.1)
Sodium: 137 mmol/L (ref 135–145)

## 2016-06-29 LAB — GLUCOSE, CAPILLARY
GLUCOSE-CAPILLARY: 128 mg/dL — AB (ref 65–99)
GLUCOSE-CAPILLARY: 105 mg/dL — AB (ref 65–99)
GLUCOSE-CAPILLARY: 259 mg/dL — AB (ref 65–99)
Glucose-Capillary: 70 mg/dL (ref 65–99)

## 2016-06-29 LAB — CBC
HCT: 23.1 % — ABNORMAL LOW (ref 35.0–47.0)
HEMOGLOBIN: 7.8 g/dL — AB (ref 12.0–16.0)
MCH: 33 pg (ref 26.0–34.0)
MCHC: 33.7 g/dL (ref 32.0–36.0)
MCV: 98.2 fL (ref 80.0–100.0)
PLATELETS: 142 10*3/uL — AB (ref 150–440)
RBC: 2.35 MIL/uL — AB (ref 3.80–5.20)
RDW: 14.6 % — ABNORMAL HIGH (ref 11.5–14.5)
WBC: 4.1 10*3/uL (ref 3.6–11.0)

## 2016-06-29 NOTE — ED Provider Notes (Signed)
-----------------------------------------   6:37 AM on 06/29/2016 -----------------------------------------   Blood pressure (!) 137/94, pulse 84, temperature 98.5 F (36.9 C), temperature source Oral, resp. rate 18, height 5\' 2"  (1.575 m), weight 172 lb 2.9 oz (78.1 kg), SpO2 98 %.  The patient had no acute events since last update.  Calm and cooperative at this time.  Patient has end-stage renal disease and should be dialyzed later today per her Monday/Wednesday/Friday schedule. Disposition is pending per clinical social work team recommendations.     Paulette Blanch, MD 06/29/16 [phone]

## 2016-06-29 NOTE — ED Notes (Signed)
Pt refusing to take medication at this time, states " I dont feel well from dialysis come back later"

## 2016-06-29 NOTE — Progress Notes (Signed)
LCSW followed up with Ms Phoebe Sharps [phone]. She reviewed patient information and spoke to [facility] [phone].  Ms reed will need to touch base with Ms Allegra Grana for additional funds to support this patient and check in with her [employer] employee. She will try to call back LCSW today. LCSW informed Ms Mariea Clonts will be here all weekend. Await call back  [employer] LCSW [phone]

## 2016-06-29 NOTE — ED Notes (Signed)
Pt refusing to take medication or drink at this time

## 2016-06-29 NOTE — Progress Notes (Signed)
Pre Dialysis 

## 2016-06-29 NOTE — ED Notes (Signed)
Patient had bath beside of bed with moderate help.

## 2016-06-29 NOTE — ED Notes (Signed)
Pt requesting more warm blankets, pt told she will get 2 total, 1 now and another after night meds

## 2016-06-29 NOTE — Progress Notes (Signed)
Dialysis started 

## 2016-06-29 NOTE — ED Notes (Signed)
Patient eating lunch at this time.,And 2 warm blankets  given.

## 2016-06-29 NOTE — ED Notes (Signed)
Patient refused vitals notified nurse  Shirlee Limerick

## 2016-06-29 NOTE — ED Notes (Signed)
Pt back from dialysis, 2L taken off, pt tolerated well

## 2016-06-29 NOTE — ED Notes (Signed)
Patient was given to patient.

## 2016-06-29 NOTE — ED Notes (Signed)
Bandage from Wed dialysis removed att, pt body odor noticed and pt seems agreeable to bath in am

## 2016-06-29 NOTE — ED Notes (Signed)
Patient back from diaslyis

## 2016-06-29 NOTE — Progress Notes (Signed)
POST HD ASSESSMENT

## 2016-06-29 NOTE — Progress Notes (Signed)
POST HD VITALS

## 2016-06-29 NOTE — Progress Notes (Signed)
END OF HD tx

## 2016-06-29 NOTE — Progress Notes (Signed)
[facility]  ROUNDING NOTE   Subjective:   Seen and examined on hemodialysis. Tolerating treatment well. UF goal of 2 litres.   Objective:  Vital signs in last 24 hours:  Temp:  [98.5 F (36.9 C)] 98.5 F (36.9 C) (08/04 0900) Pulse Rate:  [84-100] 100 (08/04 1200) Resp:  [14-28] 28 (08/04 1200) BP: (137-187)/(73-98) 143/73 (08/04 1200) SpO2:  [98 %-100 %] 99 % (08/04 1200) Weight:  [79 kg (174 lb 2.6 oz)] 79 kg (174 lb 2.6 oz) (08/04 0900)  Weight change:  Filed Weights   06/27/16 1220 06/27/16 1551 06/29/16 0900  Weight: 80 kg (176 lb 5.9 oz) 78.1 kg (172 lb 2.9 oz) 79 kg (174 lb 2.6 oz)    Intake/Output: No intake/output data recorded.   Intake/Output this shift:  No intake/output data recorded.  Physical Exam: General: NAD, sitting in chair  Head: Normocephalic, atraumatic. Moist oral mucosal membranes  Eyes: Anicteric, PERRL  Neck: Supple, trachea midline  Lungs:  Clear to auscultation  Heart: Regular rate and rhythm  Abdomen:  Soft, nontender,   Extremities:  no peripheral edema.  Neurologic: Nonfocal, moving all four extremities  Skin: No lesions  Access: Left arm AVG    Basic Metabolic Panel:  Recent Labs Lab 06/25/16 0628 06/26/16 0551 06/27/16 0912 06/27/16 1230 06/29/16 0915  NA 136 139 137 136 137  K 6.9* 5.8* 5.8* 5.8* 5.7*  CL 96* 97* 94* 96* 96*  CO2 32 35* 33* 30 32  GLUCOSE 151* 161* 156* 138* 141*  BUN 47* 30* 39* 42* 36*  CREATININE 8.73* 6.12* 8.39* 8.55* 8.01*  CALCIUM 9.2 9.2 9.5 9.1 9.4  PHOS  --   --  4.2  4.3 3.7 3.5    Liver Function Tests:  Recent Labs Lab 06/25/16 0628 06/27/16 0912 06/27/16 1230 06/29/16 0915  AST 13*  --   --   --   ALT 12*  --   --   --   ALKPHOS 50  --   --   --   BILITOT 0.4  --   --   --   PROT 6.2*  --   --   --   ALBUMIN 3.3* 3.5 3.2* 3.3*   No results for input(s): LIPASE, AMYLASE in the last 168 hours. No results for input(s): AMMONIA in the last 168  hours.  CBC:  Recent Labs Lab 06/27/16 0912 06/29/16 0915  WBC 4.8 4.1  HGB 8.6* 7.8*  HCT 25.7* 23.1*  MCV 98.6 98.2  PLT 145* 142*    Cardiac Enzymes: No results for input(s): CKTOTAL, CKMB, CKMBINDEX, TROPONINI in the last 168 hours.  BNP: Invalid input(s): POCBNP  CBG:  Recent Labs Lab 06/28/16 0747 06/28/16 1240 06/28/16 1625 06/28/16 2142 06/29/16 0836  GLUCAP 97 233* 76 126* 128*    Microbiology: Results for orders placed or performed during the hospital encounter of 06/11/16  Culture, blood (routine x 2)     Status: None   Collection Time: 06/15/16  4:20 PM  Result Value Ref Range Status   Specimen Description BLOOD ARTERIAL LINE  Final   Special Requests   Final    BOTTLES DRAWN AEROBIC AND ANAEROBIC  AER [place] ANA 5CC   Culture NO GROWTH 5 DAYS  Final   Report Status 06/20/2016 FINAL  Final  Culture, blood (routine x 2)     Status: None   Collection Time: 06/15/16  4:20 PM  Result Value Ref Range Status   Specimen Description BLOOD DRAWN  BY DIALYSIS  Final   Special Requests BOTTLES DRAWN AEROBIC AND ANAEROBIC  5CC  Final   Culture NO GROWTH 5 DAYS  Final   Report Status 06/20/2016 FINAL  Final    Coagulation Studies: No results for input(s): LABPROT, INR in the last 72 hours.  Urinalysis: No results for input(s): COLORURINE, LABSPEC, PHURINE, GLUCOSEU, HGBUR, BILIRUBINUR, KETONESUR, PROTEINUR, UROBILINOGEN, NITRITE, LEUKOCYTESUR in the last 72 hours.  Invalid input(s): APPERANCEUR    Imaging: No results found.   Medications:     . amLODipine  10 mg Oral Daily  . aspirin  81 mg Oral Daily  . cloNIDine  0.3 mg Oral BID  . divalproex  1,000 mg Oral QHS  . epoetin (EPOGEN/PROCRIT) injection  10,000 Units Intravenous Q M,W,F-HD  . escitalopram  15 mg Oral Daily  . hydrALAZINE  100 mg Oral TID  . insulin aspart  0-15 Units Subcutaneous TID WC  . insulin aspart  0-5 Units Subcutaneous QHS  . metoprolol tartrate  50 mg Oral BID  .  QUEtiapine  200 mg Oral BID  . traZODone  100 mg Oral QHS     Assessment/ Plan:  [name] is a 35 y.o. black female with developmental delay, ESRD, AOCD, SHPTH, HTN, schizophrenia, mild cognitive impairment, diabetes mellitus type II  [facility] MWF [address]  1. ESRD on HD MWF: seen and examined on hemodialysis.  - Continue MWF schedule  2. Anemia of CKD:  - Continue Epogen 10,000 units IV with dialysis..   3. SHPTH: phosphorus at goal. Not at goal.   4. Hypertension: elevated.  - clonidine, hydralazine, amlodipine, and metoprolol.    LOS: [place], Kelly [name] 8/4/201712:06 PM

## 2016-06-29 NOTE — ED Notes (Signed)
Pt transported to dialysis via wheelchair by [employer]

## 2016-06-29 NOTE — ED Notes (Signed)
Pt's sheets were changed and pt was given a warm blanket per request. Pt safe and lying in bed comfortably. Nothing is needed by staff at this time

## 2016-06-30 LAB — GLUCOSE, CAPILLARY
GLUCOSE-CAPILLARY: 181 mg/dL — AB (ref 65–99)
GLUCOSE-CAPILLARY: 179 mg/dL — AB (ref 65–99)
Glucose-Capillary: 195 mg/dL — ABNORMAL HIGH (ref 65–99)
Glucose-Capillary: 96 mg/dL (ref 65–99)

## 2016-06-30 MED ORDER — METOPROLOL TARTRATE 50 MG PO TABS
ORAL_TABLET | ORAL | Status: AC
  Administered 2016-07-03: 50 mg via ORAL
  Filled 2016-06-30: qty 1

## 2016-06-30 MED ORDER — ACETAMINOPHEN 325 MG PO TABS
650.0000 mg | ORAL_TABLET | Freq: Once | ORAL | Status: DC
  Filled 2016-06-30 (×5): qty 2

## 2016-06-30 NOTE — Progress Notes (Signed)
LCSW spoke to the proprietor of [facility] and he reported he does have a facility that can accommodate this patients unique needs ( 3x week support to dialysis). Patient information was shared and Jenny Reichmann from [facility] [phone] fax # [phone]  He will review patients info and speak to her guardian and see if she can be placed by Monday or Tuesday.   LCSW will leave all contact information for weekday LCSWA to follow up.   [employer] LCSW [phone]

## 2016-06-30 NOTE — ED Provider Notes (Signed)
-----------------------------------------   2:22 PM on 06/30/2016 -----------------------------------------   BP (!) 133/106   Pulse 86   Temp 98.3 F (36.8 C) (Oral)   Resp 18   Ht 5\' 2"  (1.575 m)   Wt 169 lb 5 oz (76.8 kg)   SpO2 97%   BMI 30.97 kg/m   Patient underwent dialysis yesterday per chart.  Acting appropriately.  Disposition is pending per Psychiatry/Behavioral Medicine team recommendations.     Nance Pear, MD 06/30/16 [phone]

## 2016-06-30 NOTE — ED Notes (Signed)
Patient states that she is having pain in her legs and back, verbal order given by Dr. Archie Balboa for Tylenol 650 mg PO. Patient informed that Dr. Archie Balboa ordered tylenol and that we were waiting for it from the pharmacy. Patient states that if she is sleeping she does not want to be woke up for the tylenol.

## 2016-06-30 NOTE — ED Notes (Signed)
Pt. Had refused to take night time medications.  Pt. Woke up and stated she wanted something to eat and she could not sleep.  Pt. Encouraged to let us take vital signs and give night time medications.  Pt. Agreed.  Pt. Also encouraged to let us take vitals and take medications when indicated in the future for her well being.

## 2016-06-30 NOTE — ED Notes (Signed)
Warm blankets provided, pt covered and encouraged to rest.

## 2016-06-30 NOTE — Progress Notes (Signed)
LCSW received call from [facility] and they have only 4 beds for elderly females. LCSW spoke to [facility], they accept female beds only. They are full Called [facility] [phone]- She has no female beds at this time.  [employer] LCSW [phone]

## 2016-07-01 LAB — GLUCOSE, CAPILLARY
GLUCOSE-CAPILLARY: 126 mg/dL — AB (ref 65–99)
GLUCOSE-CAPILLARY: 185 mg/dL — AB (ref 65–99)
GLUCOSE-CAPILLARY: 48 mg/dL — AB (ref 65–99)
GLUCOSE-CAPILLARY: 58 mg/dL — AB (ref 65–99)
GLUCOSE-CAPILLARY: 68 mg/dL (ref 65–99)
GLUCOSE-CAPILLARY: 210 mg/dL — AB (ref 65–99)
Glucose-Capillary: 148 mg/dL — ABNORMAL HIGH (ref 65–99)

## 2016-07-01 NOTE — ED Notes (Signed)
Patient refusing to let this RN check her blood sugar. Patient refusing to take evening medication. Patient educated on importance of medication and CBG monitoring. Patient verbalizes understanding and still refuses.

## 2016-07-01 NOTE — ED Notes (Signed)
Meal tray arrived from dietary. Patient eating it now. Will recheck CBG after.

## 2016-07-01 NOTE — ED Notes (Signed)
Pt has soiled self with feces while asleep. Pt taken to shower with aide to cleanse self and place new clothing on.

## 2016-07-01 NOTE — ED Notes (Signed)
Patient agreeing to eat ice cream and sun chips.

## 2016-07-01 NOTE — ED Notes (Signed)
Patient given cup of coke this morning.

## 2016-07-01 NOTE — ED Notes (Signed)
Patient resting with eyes closed. Even and non labored respirations noted.

## 2016-07-01 NOTE — ED Notes (Signed)
Pt is seen lying awake in bed watching television. Pt walked into the hallway and asked for more warm blankets. Pt was given 1 warm blanket. Pt not in need of staff assistance at this time.

## 2016-07-01 NOTE — ED Notes (Signed)
Patient states, "I'm not eating any of that. I will only eat chicken nuggets, ham, or sausage. Dietary called and requested to send up a tray with the above included.

## 2016-07-01 NOTE — ED Notes (Signed)
Patient refusing vitals to be taken at this time.

## 2016-07-01 NOTE — ED Notes (Signed)
Patient is voluntary and is pending placement. 

## 2016-07-01 NOTE — Progress Notes (Signed)
LCSW checked [facility] again for patient bed offers. LCSW will provide weekday LCSW with handoff so they can follow up on 2 potential family care home leads.  LCSW called and left message for Guardian to call LCSW, awaiting call back.  Akshitha Culmer LCSW

## 2016-07-01 NOTE — ED Notes (Signed)
Report to ashley, rn

## 2016-07-01 NOTE — ED Notes (Signed)
Pt seen sitting in bed with the TV off calling out to staff that pass by her window. Pt has been reminded of the time and other pts resting around her. Pt is not in need of anything from staff

## 2016-07-01 NOTE — ED Notes (Addendum)
MD notified of CBG. Patient refusing to eat anything at this time. 16 oz of OJ given to patient. Patient is asymptomatic at this time. Denies CP, N/V, dizziness or lightheadedness. Patient is asymptomatic at this time. Denies CP, N/V, dizziness or lightheadedness.

## 2016-07-01 NOTE — ED Notes (Signed)
Pt ambulating in room. Does not want to eat french toast sticks that were brought to her and would like a sausage biscuit.  Dining services notified and will send her a sausage biscuit.

## 2016-07-01 NOTE — ED Notes (Signed)
Patient refusing lunch at this time. Patient states, "I'm tired and I just ate not that long ago". Will check blood sugar and provide lunch tray when patient wakes up.

## 2016-07-01 NOTE — ED Notes (Signed)
Meal tray placed at bedside. Patient resting with eyes closed.

## 2016-07-01 NOTE — ED Notes (Signed)
Patient given meal tray at this time.

## 2016-07-01 NOTE — ED Provider Notes (Signed)
-----------------------------------------   7:08 AM on 07/01/2016 -----------------------------------------   Blood pressure (!) 160/89, pulse 84, temperature 98.2 F (36.8 C), temperature source Oral, resp. rate 16, height 5\' 2"  (1.575 m), weight 169 lb 5 oz (76.8 kg), SpO2 96 %.  The patient had no acute events since last update.  Calm and cooperative at this time.  Disposition is pending per Psychiatry/Behavioral Medicine team recommendations.     Paulette Blanch, MD 07/01/16 [phone]

## 2016-07-02 LAB — CBC
HEMATOCRIT: 24.9 % — AB (ref 35.0–47.0)
HEMOGLOBIN: 8.5 g/dL — AB (ref 12.0–16.0)
MCH: 33.4 pg (ref 26.0–34.0)
MCHC: 34.1 g/dL (ref 32.0–36.0)
MCV: 97.9 fL (ref 80.0–100.0)
Platelets: 206 10*3/uL (ref 150–440)
RBC: 2.54 MIL/uL — ABNORMAL LOW (ref 3.80–5.20)
RDW: 15.2 % — AB (ref 11.5–14.5)
WBC: 5.4 10*3/uL (ref 3.6–11.0)

## 2016-07-02 LAB — RENAL FUNCTION PANEL
ANION GAP: 11 (ref 5–15)
Albumin: 3.7 g/dL (ref 3.5–5.0)
BUN: 49 mg/dL — AB (ref 6–20)
CHLORIDE: 96 mmol/L — AB (ref 101–111)
CO2: 29 mmol/L (ref 22–32)
Calcium: 10.1 mg/dL (ref 8.9–10.3)
Creatinine, Ser: 9.64 mg/dL — ABNORMAL HIGH (ref 0.44–1.00)
GFR calc Af Amer: 5 mL/min — ABNORMAL LOW (ref >60)
GFR calc non Af Amer: 5 mL/min — ABNORMAL LOW (ref >60)
GLUCOSE: 142 mg/dL — AB (ref 65–99)
POTASSIUM: 6.9 mmol/L — AB (ref 3.5–5.1)
Phosphorus: 2.7 mg/dL (ref 2.5–4.6)
Sodium: 136 mmol/L (ref 135–145)

## 2016-07-02 LAB — GLUCOSE, CAPILLARY
GLUCOSE-CAPILLARY: 57 mg/dL — AB (ref 65–99)
Glucose-Capillary: 125 mg/dL — ABNORMAL HIGH (ref 65–99)
Glucose-Capillary: 261 mg/dL — ABNORMAL HIGH (ref 65–99)
Glucose-Capillary: 135 mg/dL — ABNORMAL HIGH (ref 65–99)

## 2016-07-02 MED ORDER — ACETAMINOPHEN 325 MG PO TABS
650.0000 mg | ORAL_TABLET | Freq: Four times a day (QID) | ORAL | Status: DC | PRN
  Administered 2016-07-02 – 2016-07-05 (×3): 650 mg via ORAL
  Filled 2016-07-02: qty 2

## 2016-07-02 NOTE — ED Notes (Signed)
BEHAVIORAL HEALTH ROUNDING  Patient sleeping: No.  Patient alert and oriented: yes  Behavior appropriate: Yes. ; If no, describe:  Nutrition and fluids offered: Yes  Toileting and hygiene offered: Yes  Sitter present: not applicable, Q 15 min safety rounds and observation.  Law enforcement present: Yes ODS  

## 2016-07-02 NOTE — ED Notes (Signed)
Patient to dialyisis

## 2016-07-02 NOTE — Progress Notes (Signed)
Pre dialysis  

## 2016-07-02 NOTE — Progress Notes (Signed)
Post dialysis 

## 2016-07-02 NOTE — ED Notes (Signed)
Report received from Maudie Mercury, [place]. Pt. Awaiting placement due to medical hx. Pt. Sleeping upon shift change.   Will continue to monitor.

## 2016-07-02 NOTE — ED Notes (Signed)
BEHAVIORAL HEALTH ROUNDING Patient sleeping: Yes.   Patient alert and oriented: not applicable SLEEPING Behavior appropriate: Yes.  ; If no, describe: SLEEPING Nutrition and fluids offered: No SLEEPING Toileting and hygiene offered: NoSLEEPING Sitter present: not applicable, Q 15 min safety rounds and observation. Law enforcement present: Yes ODS 

## 2016-07-02 NOTE — Progress Notes (Signed)
CSW called Renaee Munda [phone] with [facility] for an update on pt's referral. No answer, left a message. Awaiting call back.  [name], MSW, [place]

## 2016-07-02 NOTE — Progress Notes (Signed)
Subjective:  Patient seen during dialysis Tolerating well . Lab reported hyperkalemia and therefore potassium bath was changed to 1K   HEMODIALYSIS FLOWSHEET:  Blood Flow Rate (mL/min): 400 mL/min Arterial Pressure (mmHg): -160 mmHg Venous Pressure (mmHg): 220 mmHg Transmembrane Pressure (mmHg): 50 mmHg Ultrafiltration Rate (mL/min): 830 mL/min Dialysate Flow Rate (mL/min): 600 ml/min Conductivity: Machine : 14.7 Conductivity: Machine : 14.7 Dialysis Fluid Bolus: Normal Saline Bolus Amount (mL): 250 mL Dialysate Change: 1K Intra-Hemodialysis Comments: 2500. Tx complete     Objective:  Vital signs in last 24 hours:  Temp:  [98.8 F (37.1 C)-99.1 F (37.3 C)] 99.1 F (37.3 C) (08/07 1352) Pulse Rate:  [96-110] 108 (08/07 1352) Resp:  [12-25] 14 (08/07 1352) BP: (159-204)/(72-113) 172/72 (08/07 1352) SpO2:  [99 %-100 %] 100 % (08/07 1352) Weight:  [77.7 kg (171 lb 4.8 oz)-79.8 kg (175 lb 14.8 oz)] 77.7 kg (171 lb 4.8 oz) (08/07 1352)  Weight change:  Filed Weights   06/29/16 1213 07/02/16 1030 07/02/16 1352  Weight: 76.8 kg (169 lb 5 oz) 79.8 kg (175 lb 14.8 oz) 77.7 kg (171 lb 4.8 oz)    Intake/Output:    Intake/Output Summary (Last 24 hours) at 07/02/16 1557 Last data filed at 07/02/16 1352  Gross per 24 hour  Intake                0 ml  Output             2000 ml  Net            -2000 ml     Physical Exam: General: No acute distress, sitting up in chair   HEENT Anicteric, moist oral mucous membranes   Neck Supple   Pulm/lungs Normal breathing effort, clear to auscultation   CVS/Heart Regular rhythm, no rub or gallop   Abdomen:  Soft, nontender, nondistended   Extremities: No peripheral edema   Neurologic: Alert, oriented   Skin: No acute rashes   Access: AV fistula        Basic Metabolic Panel:   Recent Labs Lab 06/26/16 0551 06/27/16 0912 06/27/16 1230 06/29/16 0915 07/02/16 1035  NA 139 137 136 137 136  K 5.8* 5.8* 5.8* 5.7* 6.9*  CL  97* 94* 96* 96* 96*  CO2 35* 33* 30 32 29  GLUCOSE 161* 156* 138* 141* 142*  BUN 30* 39* 42* 36* 49*  CREATININE 6.12* 8.39* 8.55* 8.01* 9.64*  CALCIUM 9.2 9.5 9.1 9.4 10.1  PHOS  --  4.2  4.3 3.7 3.5 2.7     CBC:  Recent Labs Lab 06/27/16 0912 06/29/16 0915 07/02/16 1035  WBC 4.8 4.1 5.4  HGB 8.6* 7.8* 8.5*  HCT 25.7* 23.1* 24.9*  MCV 98.6 98.2 97.9  PLT 145* 142* 206      Microbiology:  Recent Results (from the past 720 hour(s))  Culture, blood (routine x 2)     Status: None   Collection Time: 06/15/16  4:20 PM  Result Value Ref Range Status   Specimen Description BLOOD ARTERIAL LINE  Final   Special Requests   Final    BOTTLES DRAWN AEROBIC AND ANAEROBIC  AER [place] ANA 5CC   Culture NO GROWTH 5 DAYS  Final   Report Status 06/20/2016 FINAL  Final  Culture, blood (routine x 2)     Status: None   Collection Time: 06/15/16  4:20 PM  Result Value Ref Range Status   Specimen Description BLOOD DRAWN BY DIALYSIS  Final   Special  Requests BOTTLES DRAWN AEROBIC AND ANAEROBIC  5CC  Final   Culture NO GROWTH 5 DAYS  Final   Report Status 06/20/2016 FINAL  Final    Coagulation Studies: No results for input(s): LABPROT, INR in the last 72 hours.  Urinalysis: No results for input(s): COLORURINE, LABSPEC, PHURINE, GLUCOSEU, HGBUR, BILIRUBINUR, KETONESUR, PROTEINUR, UROBILINOGEN, NITRITE, LEUKOCYTESUR in the last 72 hours.  Invalid input(s): APPERANCEUR    Imaging: No results found.   Medications:     . acetaminophen  650 mg Oral Once  . amLODipine  10 mg Oral Daily  . aspirin  81 mg Oral Daily  . cloNIDine  0.3 mg Oral BID  . divalproex  1,000 mg Oral QHS  . epoetin (EPOGEN/PROCRIT) injection  10,000 Units Intravenous Q M,W,F-HD  . escitalopram  15 mg Oral Daily  . hydrALAZINE  100 mg Oral TID  . insulin aspart  0-15 Units Subcutaneous TID WC  . insulin aspart  0-5 Units Subcutaneous QHS  . metoprolol tartrate  50 mg Oral BID  . QUEtiapine  200 mg Oral BID   . traZODone  100 mg Oral QHS   acetaminophen  Assessment/ Plan:  35 y.o. African-American female with developmental delay, ESRD, AOCD, SHPTH, HTN, schizophrenia, mild cognitive impairment, diabetes mellitus type II  [facility] MWF [address] [address]  1. ESRD on HD MWF: seen and examined on hemodialysis.  - Continue MWF schedule  2. Anemia of CKD:  -Continue Epogen 10,000 units IV with dialysis..   3. SHPTH:  - Monitor phosphorus during hospital stay Current level 2.3   4. Hyperkalemia Low potassium diet recommended Avoid orange juice, fries, other fruit juice if possible   LOS: 1 Feather Berrie 8/7/20173:57 PM

## 2016-07-02 NOTE — ED Provider Notes (Signed)
-----------------------------------------   1:23 AM on 07/02/2016 -----------------------------------------   Blood pressure (!) 143/91, pulse 88, temperature 98.2 F (36.8 C), temperature source Oral, resp. rate 17, height 5\' 2"  (1.575 m), weight 169 lb 5 oz (76.8 kg), SpO2 99 %.  The patient had no acute events since last update.  Calm and cooperative at this time.  Disposition is pending per Psychiatry/Behavioral Medicine team recommendations.     Daymon Larsen, MD 07/02/16 [phone]

## 2016-07-02 NOTE — Progress Notes (Signed)
Dialysis complete

## 2016-07-02 NOTE — ED Notes (Signed)

## 2016-07-02 NOTE — ED Notes (Signed)
BEHAVIORAL HEALTH ROUNDING Patient sleeping: Yes.   Patient alert and oriented: not applicable Behavior appropriate: Yes.  ; If no, describe:  Nutrition and fluids offered: No Toileting and hygiene offered: No Sitter present: not applicable Law enforcement present: Yes  

## 2016-07-02 NOTE — ED Notes (Signed)
BEHAVIORAL HEALTH ROUNDING Patient sleeping: Yes.   Patient alert and oriented: yes Behavior appropriate: Yes.  ; If no, describe:  Nutrition and fluids offered: Yes  Toileting and hygiene offered: Yes  Sitter present: not applicable Law enforcement present: Yes  

## 2016-07-02 NOTE — ED Notes (Signed)
Patient in dialysis.

## 2016-07-02 NOTE — Progress Notes (Signed)
Dialysis started 

## 2016-07-02 NOTE — ED Notes (Signed)
BEHAVIORAL HEALTH ROUNDING Patient sleeping: No. Patient alert and oriented: yes Behavior appropriate: Yes.  ; If no, describe:  Nutrition and fluids offered: Yes  Toileting and hygiene offered: Yes  Sitter present: not applicable Law enforcement present: Yes  

## 2016-07-02 NOTE — ED Notes (Signed)
VOL/Still waiting on placement

## 2016-07-02 NOTE — Progress Notes (Signed)
Pt declined from [facility] due to medical needs.  CSW contacted legal guardian, Porfirio Mylar [phone], and left a message.  [name], MSW, [place]

## 2016-07-02 NOTE — ED Notes (Signed)
.  [facility] 

## 2016-07-03 ENCOUNTER — Emergency Department: Payer: MEDICAID

## 2016-07-03 LAB — GLUCOSE, CAPILLARY
GLUCOSE-CAPILLARY: 104 mg/dL — AB (ref 65–99)
Glucose-Capillary: 161 mg/dL — ABNORMAL HIGH (ref 65–99)
Glucose-Capillary: 141 mg/dL — ABNORMAL HIGH (ref 65–99)

## 2016-07-03 MED ORDER — QUETIAPINE FUMARATE 200 MG PO TABS
ORAL_TABLET | ORAL | Status: AC
  Administered 2016-07-03: 200 mg via ORAL
  Filled 2016-07-03: qty 1

## 2016-07-03 MED ORDER — DIVALPROEX SODIUM 500 MG PO DR TAB
DELAYED_RELEASE_TABLET | ORAL | Status: AC
  Administered 2016-07-03: 1000 mg via ORAL
  Filled 2016-07-03: qty 2

## 2016-07-03 MED ORDER — CLONIDINE HCL 0.1 MG PO TABS
ORAL_TABLET | ORAL | Status: AC
  Administered 2016-07-03: 0.3 mg via ORAL
  Filled 2016-07-03: qty 3

## 2016-07-03 MED ORDER — INSULIN ASPART 100 UNIT/ML ~~LOC~~ SOLN
SUBCUTANEOUS | Status: AC
  Filled 2016-07-03: qty 2

## 2016-07-03 MED ORDER — METOPROLOL TARTRATE 50 MG PO TABS
ORAL_TABLET | ORAL | Status: AC
  Administered 2016-07-03: 50 mg via ORAL
  Filled 2016-07-03: qty 1

## 2016-07-03 MED ORDER — TRAZODONE HCL 100 MG PO TABS
ORAL_TABLET | ORAL | Status: AC
  Administered 2016-07-03: 100 mg via ORAL
  Filled 2016-07-03: qty 1

## 2016-07-03 NOTE — Progress Notes (Signed)
CSW consulted with supervisor. CSW was instructed that pt is no longer appropriate for ED level of care and the guardian should be notified that pt should be d/c'd from hospital by this evening. From there legal guardian should arrange placement.   CSW called legal guardian Porfirio Mylar [phone]. No answer. CSW left a message on Kelly [name]'s confidential voicemail that pt is to be d/c'd today and guardian should arrange accommodations. Awaiting call back.  CSW called [place] guardianship crisis line [phone] and spoke with Kelly [name]. CSW informed Kelly [name] that pt will need to be d/c'd by this evening. Kelly [name] states that she understands the hospital's position, however, placement has still not been secured for pt and [facility] does not have the ability to bring pt home with them as they are a court appointed guardian. CSW expressed her understanding and stated that something would need to be figured out as pt can no longer remain in the hospital. Kelly [name] stated she would call CSW back when she got to her office in about an hour. Awaiting call back.  Kelly [name], MSW, [place]

## 2016-07-03 NOTE — ED Notes (Signed)
Per pt, and verified by felicia (tech), pt fell 07/02/16 from wheelchair to floor when returning from dialysis. Per Solmon Ice, fall was unwitnessed. Today, Pt is complaining of pain right posterior head. On assessment small area posterior right head painful to touch, very slightly raised. No laceration noted. Pt a/o, perrl, walking in hall, at baseline. No deficits mentally or physically noted. Dr. Beather Arbour notified. Head CT ordered.

## 2016-07-03 NOTE — ED Notes (Signed)
Meal provided in styrofoam tray, no straws, whole fruits or utensils included.

## 2016-07-03 NOTE — ED Notes (Signed)
Patient asked for wash up,but refused shower , gave patient a basin with water, patient in room washing up.

## 2016-07-03 NOTE — ED Notes (Signed)
Blood sugar was 161 notified nurse Bill S.

## 2016-07-03 NOTE — Progress Notes (Signed)
CSW called Renaee Munda [phone] with [facility] for an update on pt's referral. Joen Laura states she has had a hard time finding additional funding for pt because there is no paperwork to backup a IDD diagnosis. Ms. Mariea Clonts states that with an IDD diagnosis additional funding would be more readily available for pt. Ms. Mariea Clonts has been in contact with [facility] for help with this issue. Ms. Mariea Clonts is still however actively pursuing case and still interested in placing pt.  CSW will contact [facility] tomorrow to follow up as well.   [name], MSW, [place]

## 2016-07-03 NOTE — ED Provider Notes (Addendum)
-----------------------------------------   6:50 AM on 07/03/2016 -----------------------------------------   Blood pressure (!) 187/97, pulse 90, temperature 98.6 F (37 C), resp. rate 18, height 5\' 2"  (1.575 m), weight 171 lb 4.8 oz (77.7 kg), SpO2 99 %.  The patient had no acute events since last update.  Calm and cooperative at this time.  Disposition is pending per clinical social work team recommendations.     Paulette Blanch, MD 07/03/16 [phone]   ----------------------------------------- 7:31 AM on 07/03/2016 -----------------------------------------  Patient tells day shift nurse that she fell from her wheelchair after dialysis yesterday and struck the back of her head. She is ambulating well without focal neurological deficits but requesting head CT. She has a little bit of tenderness on her posterior scalp without hematoma.   Paulette Blanch, MD 07/03/16 [phone]

## 2016-07-03 NOTE — ED Notes (Signed)
Went with patient to c-t scan.

## 2016-07-03 NOTE — ED Provider Notes (Signed)
-----------------------------------------   4:26 PM on 07/03/2016 -----------------------------------------   Blood pressure (!) 139/101, pulse 85, temperature 98.1 F (36.7 C), temperature source Oral, resp. rate 18, height 5\' 2"  (1.575 m), weight 171 lb 4.8 oz (77.7 kg), SpO2 99 %.  Patient had a fall on dialysis yesterday from her wheelchair and struck her head on the floor. She has had no neurological deficits and no LOC. Head CT was ordered by Dr. Beather Arbour which was negative. Patient remained stable.   Rudene Re, MD 07/03/16 [phone]

## 2016-07-04 LAB — RENAL FUNCTION PANEL
Albumin: 3.6 g/dL (ref 3.5–5.0)
Anion gap: 10 (ref 5–15)
BUN: 37 mg/dL — AB (ref 6–20)
CHLORIDE: 96 mmol/L — AB (ref 101–111)
CO2: 30 mmol/L (ref 22–32)
Calcium: 9.7 mg/dL (ref 8.9–10.3)
Creatinine, Ser: 8.81 mg/dL — ABNORMAL HIGH (ref 0.44–1.00)
GFR calc Af Amer: 6 mL/min — ABNORMAL LOW (ref >60)
GFR, EST NON AFRICAN AMERICAN: 5 mL/min — AB (ref >60)
Glucose, Bld: 162 mg/dL — ABNORMAL HIGH (ref 65–99)
POTASSIUM: 6.6 mmol/L — AB (ref 3.5–5.1)
Phosphorus: 4.3 mg/dL (ref 2.5–4.6)
Sodium: 136 mmol/L (ref 135–145)

## 2016-07-04 LAB — CBC
HEMATOCRIT: 25.3 % — AB (ref 35.0–47.0)
Hemoglobin: 8.5 g/dL — ABNORMAL LOW (ref 12.0–16.0)
MCH: 33.5 pg (ref 26.0–34.0)
MCHC: 33.8 g/dL (ref 32.0–36.0)
MCV: 99.3 fL (ref 80.0–100.0)
Platelets: 179 10*3/uL (ref 150–440)
RBC: 2.55 MIL/uL — ABNORMAL LOW (ref 3.80–5.20)
RDW: 15.6 % — AB (ref 11.5–14.5)
WBC: 4.3 10*3/uL (ref 3.6–11.0)

## 2016-07-04 LAB — GLUCOSE, CAPILLARY
GLUCOSE-CAPILLARY: 113 mg/dL — AB (ref 65–99)
Glucose-Capillary: 184 mg/dL — ABNORMAL HIGH (ref 65–99)

## 2016-07-04 LAB — POTASSIUM: Potassium: 3.4 mmol/L — ABNORMAL LOW (ref 3.5–5.1)

## 2016-07-04 MED ORDER — HYDRALAZINE HCL 50 MG PO TABS
ORAL_TABLET | ORAL | Status: AC
  Filled 2016-07-04: qty 2

## 2016-07-04 MED ORDER — ESCITALOPRAM OXALATE 10 MG PO TABS
ORAL_TABLET | ORAL | Status: AC
  Filled 2016-07-04: qty 2

## 2016-07-04 MED ORDER — ASPIRIN 81 MG PO CHEW
CHEWABLE_TABLET | ORAL | Status: AC
  Filled 2016-07-04: qty 1

## 2016-07-04 MED ORDER — QUETIAPINE FUMARATE 200 MG PO TABS
ORAL_TABLET | ORAL | Status: AC
  Filled 2016-07-04: qty 1

## 2016-07-04 MED ORDER — CLONIDINE HCL 0.1 MG PO TABS
ORAL_TABLET | ORAL | Status: AC
  Filled 2016-07-04: qty 3

## 2016-07-04 MED ORDER — SODIUM POLYSTYRENE SULFONATE 15 GM/60ML PO SUSP
30.0000 g | ORAL | Status: DC
  Administered 2016-07-05 – 2016-07-12 (×4): 30 g via ORAL
  Filled 2016-07-04 (×4): qty 120

## 2016-07-04 MED ORDER — METOPROLOL TARTRATE 50 MG PO TABS
ORAL_TABLET | ORAL | Status: AC
  Filled 2016-07-04: qty 1

## 2016-07-04 MED ORDER — AMLODIPINE BESYLATE 5 MG PO TABS
ORAL_TABLET | ORAL | Status: AC
  Filled 2016-07-04: qty 2

## 2016-07-04 NOTE — Progress Notes (Signed)
Pre Dialysis 

## 2016-07-04 NOTE — ED Notes (Signed)
Pt transported to dialysis via wheelchair by ED tech Colletta Maryland.Marland Kitchen

## 2016-07-04 NOTE — ED Notes (Signed)
Report received from dialysis nurse, ED tech is going to transport pt back to ED23.Marland Kitchen

## 2016-07-04 NOTE — Progress Notes (Signed)
HD TX ended  

## 2016-07-04 NOTE — ED Notes (Signed)
Pt transported to dialysis by this tech 

## 2016-07-04 NOTE — Progress Notes (Signed)
Post HD  

## 2016-07-04 NOTE — ED Notes (Signed)
Pt in dialysis. Lunch tray placed in pt room.

## 2016-07-04 NOTE — Progress Notes (Signed)
Dialysis started 

## 2016-07-04 NOTE — Progress Notes (Signed)
POST HD ASSESSMENT

## 2016-07-04 NOTE — Progress Notes (Signed)
CSW contacted legal guardian Kelly [name] with [facility] [phone] to update her on pt's status. CSW stated that the plan was still to have pt d/c as soon as possible. Kelly [name] became very confrontational and stated that [facility] planned to d/c pt to the streets. CSW carefully corrected Kelly [name] and stated that pt would not be d/c'd to the streets and the hospital never stated such. The plan is simply to release pt into guardian's care or placement as soon as possible as pt has been both psychiatrically and medically cleared. Kelly [name] refused to accept this explanation. She also reports that [facility] needs a letter stating that pt has been psychiatrically and medically cleared. CSW replied that Medical Records has already sent [facility] numerous notes from pt's stay. There are several notes from various providers stating pt's psychiatric and medical condition, for this reason an additional letter stating this should not be neccessary. Kelly [name] again became very confrontational and terminated the phone call while CSW was still speaking. CSW will continue to work on pt's case and call Ms. Thomas later today with updates.  CSW contacted Kelly [name], [facility] care coordinator ay [phone] to relay the information gathered from Kelly [name] yesterday. CSW inquired about documentation of pt's IDD status. Kelly [name] states that she is trying to get a psychological assessment for pt. With this assessment Kelly [name] believes it would open a lot of doors for pt placement wise. Kelly [name] will continue to try to arrange this and is also looking into 3 possible placements for pt.  CSW will check back in with Kelly [name] later today.  CSW is still working on [facility] placement and hopeful that facility will accept pt once IDD diagnosis is documented.  Kelly [name], MSW, [place]

## 2016-07-04 NOTE — Progress Notes (Addendum)
Subjective:  Patient seen during dialysis Tolerating well . Lab reported hyperkalemia and therefore potassium bath was changed to 1K   HEMODIALYSIS FLOWSHEET:  Blood Flow Rate (mL/min): 400 mL/min Arterial Pressure (mmHg): -140 mmHg Venous Pressure (mmHg): 200 mmHg Transmembrane Pressure (mmHg): 60 mmHg Ultrafiltration Rate (mL/min): 720 mL/min Dialysate Flow Rate (mL/min): 800 ml/min Conductivity: Machine : 13.8 Conductivity: Machine : 13.8 Dialysis Fluid Bolus: Normal Saline Bolus Amount (mL): 250 mL Dialysate Change: 2K Intra-Hemodialysis Comments: 3057ml removed, tx ended, pt stable  Reports falling from bed and hitting her head   Objective:  Vital signs in last 24 hours:  Temp:  [98.4 F (36.9 C)-99 F (37.2 C)] 99 F (37.2 C) (08/09 1317) Pulse Rate:  [82-105] 105 (08/09 1425) Resp:  [12-24] 17 (08/09 1425) BP: (139-183)/(73-98) 160/89 (08/09 1425) SpO2:  [94 %-100 %] 100 % (08/09 1425) Weight:  [79.3 kg (174 lb 13.2 oz)] 79.3 kg (174 lb 13.2 oz) (08/09 0935)  Weight change:  Filed Weights   07/02/16 1030 07/02/16 1352 07/04/16 0935  Weight: 79.8 kg (175 lb 14.8 oz) 77.7 kg (171 lb 4.8 oz) 79.3 kg (174 lb 13.2 oz)    Intake/Output:    Intake/Output Summary (Last 24 hours) at 07/04/16 1503 Last data filed at 07/04/16 1313  Gross per 24 hour  Intake                0 ml  Output             2500 ml  Net            -2500 ml     Physical Exam: General: No acute distress, sitting up in chair   HEENT Anicteric, moist oral mucous membranes   Neck Supple   Pulm/lungs Normal breathing effort, clear to auscultation   CVS/Heart Regular rhythm, no rub or gallop   Abdomen:  Soft, nontender, nondistended   Extremities: No peripheral edema   Neurologic: Alert, oriented   Skin: No acute rashes   Access: AV fistula        Basic Metabolic Panel:   Recent Labs Lab 06/29/16 0915 07/02/16 1035 07/04/16 0935 07/04/16 1200  NA 137 136 136  --   K 5.7* 6.9*  6.6* 3.4*  CL 96* 96* 96*  --   CO2 32 29 30  --   GLUCOSE 141* 142* 162*  --   BUN 36* 49* 37*  --   CREATININE 8.01* 9.64* 8.81*  --   CALCIUM 9.4 10.1 9.7  --   PHOS 3.5 2.7 4.3  --      CBC:  Recent Labs Lab 06/29/16 0915 07/02/16 1035 07/04/16 0935  WBC 4.1 5.4 4.3  HGB 7.8* 8.5* 8.5*  HCT 23.1* 24.9* 25.3*  MCV 98.2 97.9 99.3  PLT 142* 206 179      Microbiology:  Recent Results (from the past 720 hour(s))  Culture, blood (routine x 2)     Status: None   Collection Time: 06/15/16  4:20 PM  Result Value Ref Range Status   Specimen Description BLOOD ARTERIAL LINE  Final   Special Requests   Final    BOTTLES DRAWN AEROBIC AND ANAEROBIC  AER [place] ANA 5CC   Culture NO GROWTH 5 DAYS  Final   Report Status 06/20/2016 FINAL  Final  Culture, blood (routine x 2)     Status: None   Collection Time: 06/15/16  4:20 PM  Result Value Ref Range Status   Specimen Description BLOOD DRAWN BY  DIALYSIS  Final   Special Requests BOTTLES DRAWN AEROBIC AND ANAEROBIC  5CC  Final   Culture NO GROWTH 5 DAYS  Final   Report Status 06/20/2016 FINAL  Final    Coagulation Studies: No results for input(s): LABPROT, INR in the last 72 hours.  Urinalysis: No results for input(s): COLORURINE, LABSPEC, PHURINE, GLUCOSEU, HGBUR, BILIRUBINUR, KETONESUR, PROTEINUR, UROBILINOGEN, NITRITE, LEUKOCYTESUR in the last 72 hours.  Invalid input(s): APPERANCEUR    Imaging: Ct Head Wo Contrast  Result Date: 07/03/2016 CLINICAL DATA:  35 year old female with history of trauma from a fall with injury to back yesterday. Continued headaches since that time. EXAM: CT HEAD WITHOUT CONTRAST TECHNIQUE: Contiguous axial images were obtained from the base of the skull through the vertex without intravenous contrast. COMPARISON:  Head CT 08/08/2015. FINDINGS: Mild cerebral atrophy, age advanced, similar to the prior study. No acute displaced skull fractures are identified. No acute intracranial abnormality.  Specifically, no evidence of acute post-traumatic intracranial hemorrhage, no definite regions of acute/subacute cerebral ischemia, no focal mass, mass effect, hydrocephalus or abnormal intra or extra-axial fluid collections. The visualized paranasal sinuses and mastoids are well pneumatized. IMPRESSION: 1. No acute displaced skull fractures or acute intracranial abnormalities. 2. Mild age advanced cerebral atrophy redemonstrated. Electronically Signed   By: Vinnie Langton M.D.   On: 07/03/2016 08:03     Medications:     . acetaminophen  650 mg Oral Once  . amLODipine      . amLODipine  10 mg Oral Daily  . aspirin      . aspirin  81 mg Oral Daily  . cloNIDine      . cloNIDine  0.3 mg Oral BID  . divalproex  1,000 mg Oral QHS  . epoetin (EPOGEN/PROCRIT) injection  10,000 Units Intravenous Q M,W,F-HD  . escitalopram      . escitalopram  15 mg Oral Daily  . hydrALAZINE      . hydrALAZINE  100 mg Oral TID  . insulin aspart  0-15 Units Subcutaneous TID WC  . insulin aspart  0-5 Units Subcutaneous QHS  . metoprolol      . metoprolol tartrate  50 mg Oral BID  . QUEtiapine  200 mg Oral BID  . traZODone  100 mg Oral QHS   acetaminophen  Assessment/ Plan:  35 y.o. African-American female with developmental delay, ESRD, AOCD, SHPTH, HTN, schizophrenia, mild cognitive impairment, diabetes mellitus type II  [facility] MWF [facility] [address]  1. ESRD on HD MWF: seen and examined during hemodialysis.  - Continue MWF schedule  2. Anemia of CKD:  -Continue Epogen 10,000 units IV with dialysis..   3. SHPTH:  - Monitor phosphorus during hospital stay Current level 4.3   4. Hyperkalemia Low potassium diet recommended Avoid orange juice, fries, other fruit juice if possible Start kayexalate on non dialysis days   LOS: 1 Ysmael Hires 8/9/20173:03 PM

## 2016-07-04 NOTE — ED Provider Notes (Signed)
-----------------------------------------   6:24 AM on 07/04/2016 -----------------------------------------   Blood pressure (!) 157/87, pulse 82, temperature 98.1 F (36.7 C), temperature source Oral, resp. rate 18, height 5\' 2"  (1.575 m), weight 77.7 kg, SpO2 99 %.  The patient had no acute events since last update.  Calm and cooperative at this time.  Disposition is pending social work recommendations and placement     Hinda Kehr, MD 07/04/16 [phone]

## 2016-07-05 LAB — GLUCOSE, CAPILLARY
GLUCOSE-CAPILLARY: 172 mg/dL — AB (ref 65–99)
GLUCOSE-CAPILLARY: 154 mg/dL — AB (ref 65–99)
GLUCOSE-CAPILLARY: 129 mg/dL — AB (ref 65–99)
Glucose-Capillary: 152 mg/dL — ABNORMAL HIGH (ref 65–99)
Glucose-Capillary: 160 mg/dL — ABNORMAL HIGH (ref 65–99)
Glucose-Capillary: 168 mg/dL — ABNORMAL HIGH (ref 65–99)

## 2016-07-05 MED ORDER — HYDRALAZINE HCL 50 MG PO TABS
ORAL_TABLET | ORAL | Status: AC
  Filled 2016-07-05: qty 2

## 2016-07-05 MED ORDER — METOPROLOL TARTRATE 50 MG PO TABS
ORAL_TABLET | ORAL | Status: AC
  Administered 2016-07-05: 50 mg via ORAL
  Filled 2016-07-05: qty 1

## 2016-07-05 MED ORDER — ASPIRIN 81 MG PO CHEW
CHEWABLE_TABLET | ORAL | Status: AC
  Administered 2016-07-05: 81 mg via ORAL
  Filled 2016-07-05: qty 1

## 2016-07-05 MED ORDER — DIVALPROEX SODIUM 500 MG PO DR TAB
DELAYED_RELEASE_TABLET | ORAL | Status: AC
  Filled 2016-07-05: qty 2

## 2016-07-05 MED ORDER — QUETIAPINE FUMARATE 200 MG PO TABS
ORAL_TABLET | ORAL | Status: AC
  Filled 2016-07-05: qty 1

## 2016-07-05 MED ORDER — TRAZODONE HCL 50 MG PO TABS
ORAL_TABLET | ORAL | Status: AC
  Filled 2016-07-05: qty 2

## 2016-07-05 MED ORDER — HYDRALAZINE HCL 50 MG PO TABS
ORAL_TABLET | ORAL | Status: AC
Start: 2016-07-05 — End: 2016-07-05
  Administered 2016-07-05: 100 mg via ORAL
  Filled 2016-07-05: qty 2

## 2016-07-05 MED ORDER — CLONIDINE HCL 0.1 MG PO TABS
ORAL_TABLET | ORAL | Status: AC
  Administered 2016-07-05: 0.3 mg via ORAL
  Filled 2016-07-05: qty 2

## 2016-07-05 MED ORDER — NICOTINE 21 MG/24HR TD PT24
MEDICATED_PATCH | TRANSDERMAL | Status: AC
  Filled 2016-07-05: qty 1

## 2016-07-05 MED ORDER — ACETAMINOPHEN 325 MG PO TABS
ORAL_TABLET | ORAL | Status: AC
  Filled 2016-07-05: qty 2

## 2016-07-05 MED ORDER — ESCITALOPRAM OXALATE 10 MG PO TABS
ORAL_TABLET | ORAL | Status: AC
  Administered 2016-07-05: 15 mg via ORAL
  Filled 2016-07-05: qty 2

## 2016-07-05 MED ORDER — INSULIN ASPART 100 UNIT/ML ~~LOC~~ SOLN
SUBCUTANEOUS | Status: AC
Start: 2016-07-05 — End: 2016-07-05
  Administered 2016-07-05: 3 [IU] via SUBCUTANEOUS
  Filled 2016-07-05: qty 3

## 2016-07-05 MED ORDER — CLONIDINE HCL 0.1 MG PO TABS
ORAL_TABLET | ORAL | Status: AC
  Filled 2016-07-05: qty 1

## 2016-07-05 MED ORDER — HYDRALAZINE HCL 50 MG PO TABS
ORAL_TABLET | ORAL | Status: AC
Start: 2016-07-05 — End: 2016-07-06
  Filled 2016-07-05: qty 2

## 2016-07-05 MED ORDER — AMLODIPINE BESYLATE 5 MG PO TABS
ORAL_TABLET | ORAL | Status: AC
  Administered 2016-07-05: 10 mg via ORAL
  Filled 2016-07-05: qty 2

## 2016-07-05 MED ORDER — INSULIN ASPART 100 UNIT/ML ~~LOC~~ SOLN
SUBCUTANEOUS | Status: AC
  Filled 2016-07-05: qty 3

## 2016-07-05 MED ORDER — CLONIDINE HCL 0.1 MG PO TABS
ORAL_TABLET | ORAL | Status: AC
  Filled 2016-07-05: qty 3

## 2016-07-05 MED ORDER — INSULIN ASPART 100 UNIT/ML ~~LOC~~ SOLN
SUBCUTANEOUS | Status: AC
  Filled 2016-07-05: qty 1

## 2016-07-05 MED ORDER — IRBESARTAN 150 MG PO TABS
150.0000 mg | ORAL_TABLET | Freq: Every day | ORAL | Status: DC
  Administered 2016-07-05 – 2016-07-29 (×21): 150 mg via ORAL
  Filled 2016-07-05 (×24): qty 1

## 2016-07-05 MED ORDER — METOPROLOL TARTRATE 50 MG PO TABS
ORAL_TABLET | ORAL | Status: AC
Start: 2016-07-05 — End: 2016-07-06
  Filled 2016-07-05: qty 1

## 2016-07-05 MED ORDER — QUETIAPINE FUMARATE 200 MG PO TABS
ORAL_TABLET | ORAL | Status: AC
  Administered 2016-07-05: 200 mg via ORAL
  Filled 2016-07-05: qty 1

## 2016-07-05 NOTE — ED Notes (Signed)
Pt  Vol  Pending  Placement  

## 2016-07-05 NOTE — ED Notes (Signed)
Pt given meal tray.

## 2016-07-05 NOTE — ED Notes (Signed)
Patient refused v-signs,notified nurse Shirlee Limerick.

## 2016-07-05 NOTE — ED Notes (Signed)
Pt resting with eyes closed. Breathing equal and unlabored. Refused VS. No complaints, concerns or requests at this time.

## 2016-07-05 NOTE — ED Provider Notes (Signed)
-----------------------------------------   8:02 AM on 07/05/2016 -----------------------------------------   Blood pressure (!) 169/96, pulse (!) 117, temperature 97.9 F (36.6 C), temperature source Oral, resp. rate 18, height 5\' 2"  (1.575 m), weight 174 lb 13.2 oz (79.3 kg), SpO2 98 %.  The patient had no acute events since last update.  Calm and cooperative at this time.  Disposition is pending per Psychiatry/Behavioral Medicine team recommendations.     Paulette Blanch, MD 07/05/16 [phone]

## 2016-07-05 NOTE — Progress Notes (Signed)
CSW called [name], legal guardian, [phone] for an update on pt placement. No answer, left a message.  CSW called [facility] Coordinator supervisor Ninfa Meeker [phone] and made her aware of pt's situation. Baxter Flattery stated she would make some phone calls to get a better understanding of the hold up with placement from care coordinator Scarlette Shorts. CSW awaiting call back.  [name], MSW, [place]

## 2016-07-05 NOTE — ED Notes (Signed)
Breakfast was given to patient. 

## 2016-07-05 NOTE — ED Notes (Signed)
Pt taken to [facility] for shower in wheelchair with tech

## 2016-07-06 LAB — GLUCOSE, CAPILLARY
Glucose-Capillary: 243 mg/dL — ABNORMAL HIGH (ref 65–99)
Glucose-Capillary: 100 mg/dL — ABNORMAL HIGH (ref 65–99)
Glucose-Capillary: 308 mg/dL — ABNORMAL HIGH (ref 65–99)
Glucose-Capillary: 84 mg/dL (ref 65–99)

## 2016-07-06 LAB — CBC
HCT: 23.1 % — ABNORMAL LOW (ref 35.0–47.0)
Hemoglobin: 7.7 g/dL — ABNORMAL LOW (ref 12.0–16.0)
MCH: 33.3 pg (ref 26.0–34.0)
MCHC: 33.3 g/dL (ref 32.0–36.0)
MCV: 99.9 fL (ref 80.0–100.0)
PLATELETS: 147 10*3/uL — AB (ref 150–440)
RBC: 2.31 MIL/uL — AB (ref 3.80–5.20)
RDW: 15 % — AB (ref 11.5–14.5)
WBC: 2.4 10*3/uL — ABNORMAL LOW (ref 3.6–11.0)

## 2016-07-06 LAB — RENAL FUNCTION PANEL
Albumin: 3.1 g/dL — ABNORMAL LOW (ref 3.5–5.0)
Anion gap: 8 (ref 5–15)
BUN: 30 mg/dL — AB (ref 6–20)
CALCIUM: 8.9 mg/dL (ref 8.9–10.3)
CO2: 31 mmol/L (ref 22–32)
CREATININE: 6.98 mg/dL — AB (ref 0.44–1.00)
Chloride: 99 mmol/L — ABNORMAL LOW (ref 101–111)
GFR calc non Af Amer: 7 mL/min — ABNORMAL LOW (ref >60)
GFR, EST AFRICAN AMERICAN: 8 mL/min — AB (ref >60)
GLUCOSE: 148 mg/dL — AB (ref 65–99)
Phosphorus: 2.6 mg/dL (ref 2.5–4.6)
Potassium: 4.8 mmol/L (ref 3.5–5.1)
SODIUM: 138 mmol/L (ref 135–145)

## 2016-07-06 MED ORDER — TRAZODONE HCL 100 MG PO TABS
ORAL_TABLET | ORAL | Status: AC
  Administered 2016-07-06: 100 mg via ORAL
  Filled 2016-07-06: qty 1

## 2016-07-06 MED ORDER — QUETIAPINE FUMARATE 200 MG PO TABS
ORAL_TABLET | ORAL | Status: AC
  Administered 2016-07-06: 200 mg via ORAL
  Filled 2016-07-06: qty 1

## 2016-07-06 MED ORDER — HYDRALAZINE HCL 50 MG PO TABS
ORAL_TABLET | ORAL | Status: AC
  Administered 2016-07-06: 100 mg via ORAL
  Filled 2016-07-06: qty 2

## 2016-07-06 MED ORDER — ESCITALOPRAM OXALATE 10 MG PO TABS
ORAL_TABLET | ORAL | Status: AC
  Administered 2016-07-06: 15 mg via ORAL
  Filled 2016-07-06: qty 2

## 2016-07-06 MED ORDER — METOPROLOL TARTRATE 50 MG PO TABS
ORAL_TABLET | ORAL | Status: AC
  Administered 2016-07-06: 50 mg via ORAL
  Filled 2016-07-06: qty 2

## 2016-07-06 MED ORDER — INSULIN ASPART 100 UNIT/ML ~~LOC~~ SOLN
SUBCUTANEOUS | Status: AC
  Administered 2016-07-06: 11 [IU] via SUBCUTANEOUS
  Filled 2016-07-06: qty 11

## 2016-07-06 MED ORDER — CLONIDINE HCL 0.1 MG PO TABS
ORAL_TABLET | ORAL | Status: AC
  Administered 2016-07-06: 0.3 mg via ORAL
  Filled 2016-07-06: qty 3

## 2016-07-06 MED ORDER — METOPROLOL TARTRATE 50 MG PO TABS
ORAL_TABLET | ORAL | Status: AC
  Administered 2016-07-06: 50 mg via ORAL
  Filled 2016-07-06: qty 1

## 2016-07-06 MED ORDER — DIVALPROEX SODIUM ER 500 MG PO TB24
ORAL_TABLET | ORAL | Status: AC
  Filled 2016-07-06: qty 2

## 2016-07-06 MED ORDER — AMLODIPINE BESYLATE 5 MG PO TABS
ORAL_TABLET | ORAL | Status: AC
  Administered 2016-07-06: 10 mg via ORAL
  Filled 2016-07-06: qty 2

## 2016-07-06 MED ORDER — SODIUM CHLORIDE 0.9 % IV SOLN
200.0000 mg | Freq: Once | INTRAVENOUS | Status: AC
  Administered 2016-07-06: 200 mg via INTRAVENOUS
  Filled 2016-07-06: qty 10

## 2016-07-06 MED ORDER — INSULIN ASPART 100 UNIT/ML ~~LOC~~ SOLN
SUBCUTANEOUS | Status: AC
  Administered 2016-07-06: 5 [IU] via SUBCUTANEOUS
  Filled 2016-07-06: qty 5

## 2016-07-06 MED ORDER — ASPIRIN 81 MG PO CHEW
CHEWABLE_TABLET | ORAL | Status: AC
  Administered 2016-07-06: 81 mg via ORAL
  Filled 2016-07-06: qty 1

## 2016-07-06 MED ORDER — DIVALPROEX SODIUM 500 MG PO DR TAB
DELAYED_RELEASE_TABLET | ORAL | Status: AC
  Administered 2016-07-06: 1000 mg via ORAL
  Filled 2016-07-06: qty 2

## 2016-07-06 NOTE — ED Notes (Signed)
Asked patient why she left dialysis early. Pt states "I didn't feel good"  Denies wanting to come back to eat lunch.  Pt is not in any distress upon return from dialysis and does not have any complaints at this time except that she is tired and wants to take a nap.  Patient given an additional warm blanket at this time.  Pt given all medications from this morning.

## 2016-07-06 NOTE — ED Notes (Signed)
PT in Dialysis

## 2016-07-06 NOTE — Clinical Social Work Note (Addendum)
CSW has spent much time working on patient's case today. CSW received a call which turned out to be a conference call with patient's guardian: Kelly [name] and Kelly [name]: from the family care home: [facility] located in [place], [place]: [phone]. Address is: [address] in [place], [place] [address]. Kelly [name] informed CSW that she will be able to take patient and is aware of her behaviors. Kelly [name] stated that they will be able to come pick patient up when time. Kelly [name] stated that they would be able to provide a sitter for patient during her outpatient dialysis. Kelly [name] spoke up and stated that she is in agreement with this discharge plan. CSW explained that the next step has to be transferring her outpatient dialysis center to the one nearest the new family care home. Kelly [name] stated that the dialysis center closest to her is [facility] in [place], [place]. CSW informed Kelly [name] and Kelly [name] that it is a process to request a transfer and that it may not happen today. There is a potential that the dialysis center may refuse to take patient. CSW spent much time on the phone requesting the transfer and giving the [facility] Intake rep: Kelly [name] [phone]) the information she needed to request the transfer. Kelly [name] stated that she will put the new start date to be 8/14 but stated that it may be more realistic and to expect for the start date to be 8/16 of next week. The dialysis coordinator working on the case will be: Kelly [name] at [phone] ext: [phone]. Reference number for transfer request: [id number]. CSW will contact Kelly [name] this afternoon to see where he is in the process of the transfer. Patient cannot be discharge to the new family care home until the new center outpatient dialysis is finalized. Shela Leff MSW,LCSW [phone]

## 2016-07-06 NOTE — ED Notes (Addendum)
Phone call received from [facility] the nurse in dialysis that Kelly [name] is going to stop her treatment early because she wants fried chicken and french fries that are fried hard per [place]. Informed charlie that lunch does not arrive until after 1pm.  Charlie stated he was going to try to get patient to stay long enough to get the iron infusion that was ordered.

## 2016-07-06 NOTE — ED Notes (Signed)
Pt given breakfast tray, CBG performed. Pt awake and eating.

## 2016-07-06 NOTE — ED Notes (Signed)
Linens changed while pt at dialysis.

## 2016-07-06 NOTE — ED Notes (Signed)
Pt resting.

## 2016-07-06 NOTE — Progress Notes (Signed)
PRE DIALYSIS ASSESSMENT 

## 2016-07-06 NOTE — ED Notes (Signed)
Pt has been asleep all afternoon after dialysis.  Awaken pt to check her blood sugar and to see if she would eat some dinner.

## 2016-07-06 NOTE — ED Notes (Signed)
Report given to Jenna RN

## 2016-07-06 NOTE — Progress Notes (Signed)
Subjective:  Patient seen during dialysis Tolerating well .    HEMODIALYSIS FLOWSHEET:  Blood Flow Rate (mL/min): 400 mL/min Arterial Pressure (mmHg): -130 mmHg Venous Pressure (mmHg): 210 mmHg Transmembrane Pressure (mmHg): 60 mmHg Ultrafiltration Rate (mL/min): 570 mL/min Dialysate Flow Rate (mL/min): 800 ml/min Conductivity: Machine : 14 Conductivity: Machine : 14 Dialysis Fluid Bolus: Normal Saline Bolus Amount (mL): 250 mL Dialysate Change: 2K Intra-Hemodialysis Comments: 537ML (RESTING WIT EYES OPEN)   Potassium 4.8 today   Objective:  Vital signs in last 24 hours:  Temp:  [98 F (36.7 C)-98.8 F (37.1 C)] 98.8 F (37.1 C) (08/11 1010) Pulse Rate:  [83-98] 94 (08/11 1100) Resp:  [18] 18 (08/11 1100) BP: (143-168)/(81-94) 168/94 (08/11 1100) SpO2:  [99 %-100 %] 100 % (08/11 1010)  Weight change:  Filed Weights   07/02/16 1030 07/02/16 1352 07/04/16 0935  Weight: 79.8 kg (175 lb 14.8 oz) 77.7 kg (171 lb 4.8 oz) 79.3 kg (174 lb 13.2 oz)    Intake/Output:    Intake/Output Summary (Last 24 hours) at 07/06/16 1109 Last data filed at 07/06/16 [id number]  Gross per 24 hour  Intake              240 ml  Output                0 ml  Net              240 ml     Physical Exam: General: No acute distress, sitting up in chair   HEENT Anicteric, moist oral mucous membranes   Neck Supple   Pulm/lungs Normal breathing effort, clear to auscultation   CVS/Heart Regular rhythm, no rub or gallop   Abdomen:  Soft, nontender, nondistended   Extremities: No peripheral edema   Neurologic: Alert, oriented   Skin: No acute rashes   Access: AV fistula        Basic Metabolic Panel:   Recent Labs Lab 07/02/16 1035 07/04/16 0935 07/04/16 1200 07/06/16 1015  NA 136 136  --  138  K 6.9* 6.6* 3.4* 4.8  CL 96* 96*  --  99*  CO2 29 30  --  31  GLUCOSE 142* 162*  --  148*  BUN 49* 37*  --  30*  CREATININE 9.64* 8.81*  --  6.98*  CALCIUM 10.1 9.7  --  8.9  PHOS 2.7 4.3  --   2.6     CBC:  Recent Labs Lab 07/02/16 1035 07/04/16 0935 07/06/16 1015  WBC 5.4 4.3 2.4*  HGB 8.5* 8.5* 7.7*  HCT 24.9* 25.3* 23.1*  MCV 97.9 99.3 99.9  PLT 206 179 147*      Microbiology:  Recent Results (from the past 720 hour(s))  Culture, blood (routine x 2)     Status: None   Collection Time: 06/15/16  4:20 PM  Result Value Ref Range Status   Specimen Description BLOOD ARTERIAL LINE  Final   Special Requests   Final    BOTTLES DRAWN AEROBIC AND ANAEROBIC  AER [place] ANA 5CC   Culture NO GROWTH 5 DAYS  Final   Report Status 06/20/2016 FINAL  Final  Culture, blood (routine x 2)     Status: None   Collection Time: 06/15/16  4:20 PM  Result Value Ref Range Status   Specimen Description BLOOD DRAWN BY DIALYSIS  Final   Special Requests BOTTLES DRAWN AEROBIC AND ANAEROBIC  5CC  Final   Culture NO GROWTH 5 DAYS  Final   Report  Status 06/20/2016 FINAL  Final    Coagulation Studies: No results for input(s): LABPROT, INR in the last 72 hours.  Urinalysis: No results for input(s): COLORURINE, LABSPEC, PHURINE, GLUCOSEU, HGBUR, BILIRUBINUR, KETONESUR, PROTEINUR, UROBILINOGEN, NITRITE, LEUKOCYTESUR in the last 72 hours.  Invalid input(s): APPERANCEUR    Imaging: No results found.   Medications:     . acetaminophen  650 mg Oral Once  . amLODipine  10 mg Oral Daily  . aspirin  81 mg Oral Daily  . cloNIDine  0.3 mg Oral BID  . divalproex  1,000 mg Oral QHS  . epoetin (EPOGEN/PROCRIT) injection  10,000 Units Intravenous Q M,W,F-HD  . escitalopram  15 mg Oral Daily  . hydrALAZINE  100 mg Oral TID  . insulin aspart  0-15 Units Subcutaneous TID WC  . insulin aspart  0-5 Units Subcutaneous QHS  . irbesartan  150 mg Oral QHS  . metoprolol tartrate  50 mg Oral BID  . QUEtiapine  200 mg Oral BID  . sodium polystyrene  30 g Oral Once per day on Tue Thu Sat  . traZODone  100 mg Oral QHS   acetaminophen  Assessment/ Plan:  35 y.o. African-American female with  developmental delay, ESRD, AOCD, SHPTH, HTN, schizophrenia, mild cognitive impairment, diabetes mellitus type II  [facility] MWF [facility] [address]  1. ESRD on HD MWF: seen and examined during hemodialysis.  - Continue MWF schedule  2. Anemia of CKD:  -Continue Epogen 10,000 units IV with dialysis.Marland Kitchen  - low Hgb noted - iv venofer 200 mg given during HD (8/11)  3. SHPTH:  - Monitor phosphorus during hospital stay Current level 2.6  4. Hyperkalemia Low potassium diet recommended Avoid orange juice, fries, other fruit juice if possible Continue kayexalate on non dialysis days   LOS: 1 Kelly [name] 8/11/201711:09 AM

## 2016-07-06 NOTE — Progress Notes (Signed)
HD DISCONTINUED 

## 2016-07-06 NOTE — ED Notes (Signed)
Pt returned from dialysis. Transferred from wheelchair to bed without incident. Pt ambulating in room without difficulty.  Pt stopped dialysis treatment 1 hour early because she wanted to eat lunch which she requested to be fried chicken and french fries.  Pt received tray that was sent to ED which was pretzels and ham and cheese sandwich. Pt threw sandwich in trash, ate pretzels and asked for [place] Sandwich.  Pt given [place] sandwich only.

## 2016-07-06 NOTE — Progress Notes (Signed)
HD STARTED  

## 2016-07-06 NOTE — ED Notes (Addendum)
Pt asking about having a cup of coffee explained to pt that we are unable to serve coffee in this area.  Pt continues to stand at the door.  Pt has been told by this RN and EDT she cannot have coffee.  Pt also asking about tuna or chicken salad sandwich. Informed pt that whatever the cafeteria is serving is what she will have for lunch.  Pt had cracker wrappers and trash on the floor and was asked to clean up her area which she did.  Pt was cooperative and did not get aggressive or agitated with staff.  Pt is ambulatory in room without difficulty.

## 2016-07-06 NOTE — Clinical Social Work Note (Signed)
CSW called the [facility] and spoke with patient's assigned coordinator: Kelly [name]. Kelly [name] initially didn't realize he even had patient's case. When he determined that the patient's case was in fact his, he said that the requested dialysis center in [facility] would request records to be sent to them by me. CSW had to explain again that the patient was not a new start and that she would be a transfer. Kelly [name] then stated he would call patient's current outpatient dialysis center and request that they send their records to the center in [place]. The Director of the [facility] in [place] is [place]: [phone]. Once the records are received in [place], their Medical Director will have to review them and decide if they are going to accept patient. This process will not happen today or over the weekend. CSW will follow up on Monday. CSW has updated both Kelly [name] at the family care home and Kelly [name], patient's guardian. Kelly [name] MSW,LCSW [phone]

## 2016-07-06 NOTE — Progress Notes (Signed)
POST DIALYSIS ASSESSMENT 

## 2016-07-06 NOTE — ED Notes (Signed)
Spoke with Kelly [name] in dialysis, states he is ready for patient to go to dialysis. Pt wheeled to dialysis with EDT and chart taken with her.

## 2016-07-06 NOTE — ED Notes (Signed)
Pt ask for coffee and informed we do not give coffee in this area.

## 2016-07-06 NOTE — ED Provider Notes (Signed)
-----------------------------------------   6:15 AM on 07/06/2016 -----------------------------------------   Blood pressure (!) 161/85, pulse 83, temperature 98 F (36.7 C), temperature source Oral, resp. rate 18, height 5\' 2"  (1.575 m), weight 174 lb 13.2 oz (79.3 kg), SpO2 100 %.  Patient due for HD today. BP meds held. The patient had no acute events since last update.  Calm and cooperative at this time.  Disposition is pending per Psychiatry/Behavioral Medicine team recommendations.     Rudene Re, MD 07/06/16 [phone]

## 2016-07-06 NOTE — ED Notes (Signed)
Pt in dialysis

## 2016-07-07 LAB — GLUCOSE, CAPILLARY
GLUCOSE-CAPILLARY: 123 mg/dL — AB (ref 65–99)
GLUCOSE-CAPILLARY: 168 mg/dL — AB (ref 65–99)
Glucose-Capillary: 126 mg/dL — ABNORMAL HIGH (ref 65–99)
Glucose-Capillary: 243 mg/dL — ABNORMAL HIGH (ref 65–99)
Glucose-Capillary: 230 mg/dL — ABNORMAL HIGH (ref 65–99)

## 2016-07-07 MED ORDER — ASPIRIN 81 MG PO CHEW
CHEWABLE_TABLET | ORAL | Status: AC
  Administered 2016-07-07: 81 mg via ORAL
  Filled 2016-07-07: qty 1

## 2016-07-07 MED ORDER — DIVALPROEX SODIUM 500 MG PO DR TAB
DELAYED_RELEASE_TABLET | ORAL | Status: AC
  Administered 2016-07-07: 1000 mg via ORAL
  Filled 2016-07-07: qty 2

## 2016-07-07 MED ORDER — ESCITALOPRAM OXALATE 10 MG PO TABS
ORAL_TABLET | ORAL | Status: AC
  Administered 2016-07-07: 15 mg via ORAL
  Filled 2016-07-07: qty 2

## 2016-07-07 MED ORDER — METOPROLOL TARTRATE 50 MG PO TABS
ORAL_TABLET | ORAL | Status: AC
  Administered 2016-07-07: 50 mg via ORAL
  Filled 2016-07-07: qty 1

## 2016-07-07 MED ORDER — AMLODIPINE BESYLATE 5 MG PO TABS
ORAL_TABLET | ORAL | Status: AC
  Administered 2016-07-07: 10 mg via ORAL
  Filled 2016-07-07: qty 2

## 2016-07-07 MED ORDER — CLONIDINE HCL 0.1 MG PO TABS
ORAL_TABLET | ORAL | Status: AC
  Administered 2016-07-07: 0.3 mg via ORAL
  Filled 2016-07-07: qty 3

## 2016-07-07 MED ORDER — QUETIAPINE FUMARATE 200 MG PO TABS
ORAL_TABLET | ORAL | Status: AC
  Administered 2016-07-07: 200 mg via ORAL
  Filled 2016-07-07: qty 1

## 2016-07-07 MED ORDER — HYDRALAZINE HCL 50 MG PO TABS
ORAL_TABLET | ORAL | Status: AC
  Administered 2016-07-07: 100 mg via ORAL
  Filled 2016-07-07: qty 2

## 2016-07-07 MED ORDER — TRAZODONE HCL 50 MG PO TABS
ORAL_TABLET | ORAL | Status: AC
  Administered 2016-07-07: 100 mg via ORAL
  Filled 2016-07-07: qty 2

## 2016-07-07 MED ORDER — INSULIN ASPART 100 UNIT/ML ~~LOC~~ SOLN
SUBCUTANEOUS | Status: AC
  Administered 2016-07-07: 5 [IU] via SUBCUTANEOUS
  Filled 2016-07-07: qty 5

## 2016-07-07 MED ORDER — INSULIN ASPART 100 UNIT/ML ~~LOC~~ SOLN
SUBCUTANEOUS | Status: AC
  Administered 2016-07-07: 3 [IU] via SUBCUTANEOUS
  Filled 2016-07-07: qty 3

## 2016-07-07 MED ORDER — INSULIN ASPART 100 UNIT/ML ~~LOC~~ SOLN
SUBCUTANEOUS | Status: AC
  Administered 2016-07-07: 2 [IU] via SUBCUTANEOUS
  Filled 2016-07-07: qty 2

## 2016-07-07 NOTE — ED Notes (Signed)
Pt requesting juice - advised she is diabetic and not allowed, especially since she ate all her dinner (grilled cheese, banana, milk and celery, as well as her left over sub from lunch). Checked her sugar to show her she would be high after eating all that - it was 243. Pt given water and was okay with that decision.

## 2016-07-07 NOTE — ED Notes (Signed)

## 2016-07-07 NOTE — ED Notes (Signed)
Pt's bp 148/77 - spoke with dr. Jimmye Norman d/t the large amount of bp meds pt takes. Pt can have her clonidine and lopressor. Hold the hydralazine

## 2016-07-07 NOTE — ED Notes (Signed)
Pt frequently asking staff for warm blankets, something to drink, getting tucked in, changing her dinning options.  Pt told she can have 2 warm blankets for the shift. As patient frequently will have 5-10 blankets on her at times.  Pt given 2 warm blankets at this time in exchange for 3 of the 6 blankets she already has.  Pt ate and drank juice and milk with breakfast.  Patient did clean up her room and make her bed.  This RN did cover patient up with warm blankets and informed her again that that was all she was getting for the shift for warm blankets as she is frequently seeking attention of staff members and staff splitting.

## 2016-07-07 NOTE — ED Provider Notes (Signed)
-----------------------------------------   5:20 AM on 07/07/2016 -----------------------------------------   Blood pressure (!) 172/86, pulse 95, temperature 99 F (37.2 C), temperature source Oral, resp. rate 15, height 5\' 2"  (1.575 m), weight 174 lb 9.6 oz (79.2 kg), SpO2 97 %.  The patient had no acute events since last update.  Calm and cooperative at this time.  Disposition is pending per Psychiatry/Behavioral Medicine team recommendations.     Schuyler Amor, MD 07/07/16 [phone]

## 2016-07-07 NOTE — ED Notes (Signed)
Pt sleeping. 

## 2016-07-07 NOTE — ED Notes (Signed)
Pt given dinner tray. Pt only ate the chips off the lunch tray.

## 2016-07-08 LAB — GLUCOSE, CAPILLARY
GLUCOSE-CAPILLARY: 104 mg/dL — AB (ref 65–99)
GLUCOSE-CAPILLARY: 163 mg/dL — AB (ref 65–99)
Glucose-Capillary: 311 mg/dL — ABNORMAL HIGH (ref 65–99)
Glucose-Capillary: 112 mg/dL — ABNORMAL HIGH (ref 65–99)

## 2016-07-08 MED ORDER — CLONIDINE HCL 0.1 MG PO TABS
0.3000 mg | ORAL_TABLET | Freq: Two times a day (BID) | ORAL | Status: DC
  Administered 2016-07-08 – 2016-07-30 (×39): 0.3 mg via ORAL
  Filled 2016-07-08 (×40): qty 3

## 2016-07-08 MED ORDER — QUETIAPINE FUMARATE 200 MG PO TABS
200.0000 mg | ORAL_TABLET | Freq: Two times a day (BID) | ORAL | Status: DC
  Administered 2016-07-08 – 2016-08-03 (×47): 200 mg via ORAL
  Filled 2016-07-08 (×44): qty 1

## 2016-07-08 MED ORDER — AMLODIPINE BESYLATE 5 MG PO TABS
ORAL_TABLET | ORAL | Status: AC
Start: 2016-07-08 — End: 2016-07-08
  Administered 2016-07-08: 10 mg via ORAL
  Filled 2016-07-08: qty 2

## 2016-07-08 MED ORDER — QUETIAPINE FUMARATE 200 MG PO TABS
ORAL_TABLET | ORAL | Status: AC
  Administered 2016-07-08: 200 mg via ORAL
  Filled 2016-07-08: qty 1

## 2016-07-08 MED ORDER — ASPIRIN 81 MG PO CHEW
81.0000 mg | CHEWABLE_TABLET | Freq: Every day | ORAL | Status: DC
  Administered 2016-07-09 – 2016-08-03 (×26): 81 mg via ORAL
  Filled 2016-07-08 (×23): qty 1

## 2016-07-08 MED ORDER — DIVALPROEX SODIUM 500 MG PO DR TAB
1000.0000 mg | DELAYED_RELEASE_TABLET | Freq: Every day | ORAL | Status: DC
  Administered 2016-07-08 – 2016-08-02 (×24): 1000 mg via ORAL
  Filled 2016-07-08 (×26): qty 2

## 2016-07-08 MED ORDER — ESCITALOPRAM OXALATE 10 MG PO TABS
ORAL_TABLET | ORAL | Status: AC
  Administered 2016-07-08: 15 mg via ORAL
  Filled 2016-07-08: qty 2

## 2016-07-08 MED ORDER — INSULIN ASPART 100 UNIT/ML ~~LOC~~ SOLN
0.0000 [IU] | Freq: Three times a day (TID) | SUBCUTANEOUS | Status: DC
  Administered 2016-07-09: 3 [IU] via SUBCUTANEOUS
  Administered 2016-07-09: 5 [IU] via SUBCUTANEOUS
  Administered 2016-07-10: 2 [IU] via SUBCUTANEOUS
  Administered 2016-07-10 (×2): 3 [IU] via SUBCUTANEOUS
  Administered 2016-07-11: 8 [IU] via SUBCUTANEOUS
  Administered 2016-07-11: 2 [IU] via SUBCUTANEOUS
  Administered 2016-07-12 (×2): 3 [IU] via SUBCUTANEOUS
  Administered 2016-07-13: 15 [IU] via SUBCUTANEOUS
  Administered 2016-07-14: 3 [IU] via SUBCUTANEOUS
  Administered 2016-07-14 (×2): 2 [IU] via SUBCUTANEOUS
  Administered 2016-07-15: 3 [IU] via SUBCUTANEOUS
  Administered 2016-07-15: 5 [IU] via SUBCUTANEOUS
  Administered 2016-07-16: 8 [IU] via SUBCUTANEOUS
  Administered 2016-07-16: 5 [IU] via SUBCUTANEOUS
  Administered 2016-07-16 – 2016-07-17 (×2): 3 [IU] via SUBCUTANEOUS
  Administered 2016-07-17: 2 [IU] via SUBCUTANEOUS
  Administered 2016-07-17 – 2016-07-18 (×2): 3 [IU] via SUBCUTANEOUS
  Administered 2016-07-18 – 2016-07-20 (×4): 5 [IU] via SUBCUTANEOUS
  Administered 2016-07-21 (×2): 3 [IU] via SUBCUTANEOUS
  Administered 2016-07-21: 2 [IU] via SUBCUTANEOUS
  Administered 2016-07-22 – 2016-07-24 (×4): 3 [IU] via SUBCUTANEOUS
  Administered 2016-07-24: 5 [IU] via SUBCUTANEOUS
  Administered 2016-07-25: 2 [IU] via SUBCUTANEOUS
  Administered 2016-07-26: 5 [IU] via SUBCUTANEOUS
  Administered 2016-07-26: 3 [IU] via SUBCUTANEOUS
  Administered 2016-07-27: 11 [IU] via SUBCUTANEOUS
  Administered 2016-07-27: 3 [IU] via SUBCUTANEOUS
  Administered 2016-07-28: 2 [IU] via SUBCUTANEOUS
  Administered 2016-07-28: 8 [IU] via SUBCUTANEOUS
  Administered 2016-07-28 – 2016-07-29 (×2): 5 [IU] via SUBCUTANEOUS
  Administered 2016-07-29: 8 [IU] via SUBCUTANEOUS
  Administered 2016-07-29 – 2016-07-30 (×2): 2 [IU] via SUBCUTANEOUS
  Administered 2016-07-30: 8 [IU] via SUBCUTANEOUS
  Administered 2016-07-30 – 2016-07-31 (×2): 5 [IU] via SUBCUTANEOUS
  Administered 2016-08-01: 8 [IU] via SUBCUTANEOUS
  Administered 2016-08-01 (×2): 5 [IU] via SUBCUTANEOUS
  Administered 2016-08-02: 3 [IU] via SUBCUTANEOUS
  Administered 2016-08-02: 2 [IU] via SUBCUTANEOUS
  Administered 2016-08-02: 3 [IU] via SUBCUTANEOUS
  Administered 2016-08-03: 8 [IU] via SUBCUTANEOUS
  Administered 2016-08-03: 2 [IU] via SUBCUTANEOUS
  Filled 2016-07-08: qty 2
  Filled 2016-07-08 (×2): qty 5
  Filled 2016-07-08: qty 3
  Filled 2016-07-08 (×2): qty 5
  Filled 2016-07-08 (×2): qty 2
  Filled 2016-07-08: qty 8
  Filled 2016-07-08 (×2): qty 3
  Filled 2016-07-08 (×2): qty 5
  Filled 2016-07-08: qty 3
  Filled 2016-07-08: qty 8
  Filled 2016-07-08: qty 5
  Filled 2016-07-08: qty 4
  Filled 2016-07-08: qty 5
  Filled 2016-07-08 (×2): qty 2
  Filled 2016-07-08: qty 3
  Filled 2016-07-08: qty 5
  Filled 2016-07-08: qty 2
  Filled 2016-07-08: qty 5
  Filled 2016-07-08: qty 3
  Filled 2016-07-08: qty 2
  Filled 2016-07-08: qty 8
  Filled 2016-07-08: qty 5
  Filled 2016-07-08 (×3): qty 3
  Filled 2016-07-08: qty 5
  Filled 2016-07-08: qty 3
  Filled 2016-07-08: qty 5
  Filled 2016-07-08 (×2): qty 3
  Filled 2016-07-08: qty 2
  Filled 2016-07-08 (×6): qty 3
  Filled 2016-07-08: qty 2
  Filled 2016-07-08: qty 3
  Filled 2016-07-08: qty 5
  Filled 2016-07-08: qty 8
  Filled 2016-07-08 (×2): qty 3
  Filled 2016-07-08: qty 5
  Filled 2016-07-08: qty 15
  Filled 2016-07-08: qty 2
  Filled 2016-07-08: qty 5
  Filled 2016-07-08: qty 3

## 2016-07-08 MED ORDER — HYDRALAZINE HCL 50 MG PO TABS
100.0000 mg | ORAL_TABLET | Freq: Three times a day (TID) | ORAL | Status: DC
  Administered 2016-07-08 – 2016-07-30 (×52): 100 mg via ORAL
  Administered 2016-07-30: 50 mg via ORAL
  Administered 2016-07-31 – 2016-08-02 (×6): 100 mg via ORAL
  Filled 2016-07-08 (×21): qty 2
  Filled 2016-07-08: qty 1
  Filled 2016-07-08 (×37): qty 2

## 2016-07-08 MED ORDER — METOPROLOL TARTRATE 50 MG PO TABS
ORAL_TABLET | ORAL | Status: AC
  Administered 2016-07-08: 50 mg via ORAL
  Filled 2016-07-08: qty 1

## 2016-07-08 MED ORDER — METOPROLOL TARTRATE 50 MG PO TABS
50.0000 mg | ORAL_TABLET | Freq: Two times a day (BID) | ORAL | Status: DC
  Administered 2016-07-08 – 2016-08-03 (×46): 50 mg via ORAL
  Filled 2016-07-08 (×41): qty 1

## 2016-07-08 MED ORDER — HYDRALAZINE HCL 50 MG PO TABS
ORAL_TABLET | ORAL | Status: AC
  Administered 2016-07-08: 100 mg via ORAL
  Filled 2016-07-08: qty 2

## 2016-07-08 MED ORDER — INSULIN ASPART 100 UNIT/ML ~~LOC~~ SOLN
0.0000 [IU] | Freq: Every day | SUBCUTANEOUS | Status: DC
  Administered 2016-07-14: 3 [IU] via SUBCUTANEOUS
  Administered 2016-07-17: 2 [IU] via SUBCUTANEOUS
  Administered 2016-07-18 – 2016-07-21 (×2): 3 [IU] via SUBCUTANEOUS
  Administered 2016-07-22 – 2016-07-25 (×4): 2 [IU] via SUBCUTANEOUS
  Administered 2016-07-27: 3 [IU] via SUBCUTANEOUS
  Administered 2016-07-28: 2 [IU] via SUBCUTANEOUS
  Administered 2016-07-31: 3 [IU] via SUBCUTANEOUS
  Administered 2016-08-02: 2 [IU] via SUBCUTANEOUS
  Filled 2016-07-08: qty 3
  Filled 2016-07-08 (×3): qty 2
  Filled 2016-07-08: qty 3
  Filled 2016-07-08: qty 2
  Filled 2016-07-08: qty 3
  Filled 2016-07-08: qty 2
  Filled 2016-07-08: qty 3
  Filled 2016-07-08: qty 2

## 2016-07-08 MED ORDER — ESCITALOPRAM OXALATE 10 MG PO TABS
15.0000 mg | ORAL_TABLET | Freq: Every day | ORAL | Status: DC
  Administered 2016-07-09 – 2016-08-03 (×26): 15 mg via ORAL
  Filled 2016-07-08 (×11): qty 2
  Filled 2016-07-08: qty 1
  Filled 2016-07-08: qty 2
  Filled 2016-07-08: qty 1
  Filled 2016-07-08 (×10): qty 2

## 2016-07-08 MED ORDER — ASPIRIN 81 MG PO CHEW
CHEWABLE_TABLET | ORAL | Status: AC
  Administered 2016-07-08: 81 mg via ORAL
  Filled 2016-07-08: qty 1

## 2016-07-08 MED ORDER — CLONIDINE HCL 0.1 MG PO TABS
ORAL_TABLET | ORAL | Status: AC
  Administered 2016-07-08: 0.3 mg via ORAL
  Filled 2016-07-08: qty 3

## 2016-07-08 MED ORDER — DIPHENHYDRAMINE HCL 25 MG PO CAPS
25.0000 mg | ORAL_CAPSULE | Freq: Once | ORAL | Status: AC
  Administered 2016-07-08: 25 mg via ORAL

## 2016-07-08 MED ORDER — TRAZODONE HCL 100 MG PO TABS
100.0000 mg | ORAL_TABLET | Freq: Every day | ORAL | Status: DC
  Administered 2016-07-08 – 2016-08-02 (×25): 100 mg via ORAL
  Filled 2016-07-08 (×23): qty 1

## 2016-07-08 NOTE — ED Notes (Signed)
BEHAVIORAL HEALTH ROUNDING Patient sleeping: Yes.   Patient alert and oriented: not applicable SLEEPING Behavior appropriate: Yes.  ; If no, describe: SLEEPING Nutrition and fluids offered: No SLEEPING Toileting and hygiene offered: NoSLEEPING Sitter present: not applicable, Q 15 min safety rounds and observation. Law enforcement present: Yes ODS 

## 2016-07-08 NOTE — ED Notes (Signed)
Pt wakes up from nap and her eye lids and face is swollen.  Pt states this happens sometimes before dialysis. Edd Fabian, Md brought to bedside. New orders received

## 2016-07-08 NOTE — ED Notes (Signed)
Pt ambulatory in hall and room

## 2016-07-08 NOTE — ED Provider Notes (Signed)
-----------------------------------------   5:56 PM on 07/08/2016 -----------------------------------------  The patient has swelling of the upper aspect of her face bilaterally as well as some periorbital edema. No edema of the lips, tongue, no swelling in the posterior oropharynx, no stridor or wheeze, no concern for impending airway compromise. She reports that this happens to her sometimes before needing dialysis. She denies any chest pain or difficulty breathing. Her vital signs are stable, lungs are clear, no hypoxia. She underwent dialysis as scheduled on 07/06/2016. We'll give Benadryl and reassess.  ----------------------------------------- 11:24 PM on 07/08/2016 ----------------------------------------- I reassessed the patient, the swelling in the upper face is unchanged, still with no edema of the lips, or tongue, no concern for impending airway compromise. No chest pain or shortness of breath, she has no wheezing or stridor. We'll continue to monitor, she will undergo dialysis in the morning.   Joanne Gavel, MD 07/08/16 [phone]

## 2016-07-08 NOTE — ED Notes (Signed)
Per Pharmacy pt medications need to be reordered to correspond with the pyxsis. Lord, Md informed

## 2016-07-08 NOTE — ED Notes (Addendum)
Pt given warm blankets.

## 2016-07-08 NOTE — ED Notes (Signed)
Pt refuses blood pressure and Medication at this time. States I am sleeping now No. Nurse will try to give medication with dinner.

## 2016-07-08 NOTE — ED Notes (Signed)
Pt given breakfast tray. States she does not eat french toast dietary called for another breakfast tray

## 2016-07-08 NOTE — ED Provider Notes (Signed)
Vitals:   07/08/16 0556 07/08/16 0923  BP: (!) 168/80 (!) 171/74  Pulse: 84 83  Resp: 16 16  Temp: 98.4 F (36.9 C) 98.4 F (36.9 C)   No reported acute events over 24 hours.  This patient is unfortunately still awaiting appropriate placement dispo being worked on by [employer].   Lisa Roca, MD 07/08/16 [phone]

## 2016-07-08 NOTE — ED Notes (Signed)
BEHAVIORAL HEALTH ROUNDING  Patient sleeping: No.  Patient alert and oriented: yes  Behavior appropriate: Yes. ; If no, describe:  Nutrition and fluids offered: Yes  Toileting and hygiene offered: Yes  Sitter present: not applicable, Q 15 min safety rounds and observation.  Law enforcement present: Yes ODS  

## 2016-07-08 NOTE — ED Notes (Signed)
Given breakfast tray. 

## 2016-07-08 NOTE — ED Notes (Signed)
Pt sleeping. 

## 2016-07-08 NOTE — ED Notes (Signed)
Dinner tray ordered from [employer] was not sent

## 2016-07-08 NOTE — ED Notes (Signed)
Hospital bed provided to pt, ER stretcher removed

## 2016-07-09 LAB — GLUCOSE, CAPILLARY
Glucose-Capillary: 138 mg/dL — ABNORMAL HIGH (ref 65–99)
Glucose-Capillary: 159 mg/dL — ABNORMAL HIGH (ref 65–99)
Glucose-Capillary: 228 mg/dL — ABNORMAL HIGH (ref 65–99)

## 2016-07-09 NOTE — Progress Notes (Signed)
PRE HD INFO 

## 2016-07-09 NOTE — ED Notes (Signed)
Pt alert, asking for warm blankets and when she is going to dialysis, pt ate 100% of breakfast

## 2016-07-09 NOTE — Clinical Social Work Note (Signed)
CSW spoke with Jackelyn Poling at [facility] in [place] and she confirmed that her fax went through to the [facility] in [place] (all 5 pages). CSW will follow up with Patty in [place] to confirm she received it and to see how long a turn around time is for their medical director to make a decision. Shela Leff MSW,LCSW [phone]

## 2016-07-09 NOTE — ED Notes (Signed)
Pt given apple juice  

## 2016-07-09 NOTE — ED Notes (Signed)
ED Behavioral diet taken to patient.in dialysis with dialysis RN's approval.

## 2016-07-09 NOTE — ED Provider Notes (Signed)
-----------------------------------------   6:48 AM on 07/09/2016 -----------------------------------------   Blood pressure (!) 176/88, pulse 88, temperature 97.9 F (36.6 C), temperature source Oral, resp. rate 20, height 5\' 2"  (1.575 m), weight 174 lb 9.6 oz (79.2 kg), SpO2 99 %.  The patient had no acute events since last update.  Calm and cooperative at this time.  Disposition is pending per Psychiatry/Behavioral Medicine team recommendations.  The patient had no complaints overnight, she will be going to dialysis today.  She is still awaiting placement.    Loney Hering, MD 07/09/16 [phone]

## 2016-07-09 NOTE — ED Notes (Signed)
Attempted to get pts blood pressure and blood glucose and pt refused.  Pt angry and will not let anyone touch her. States "do not fucking touch me".  Sonja  RN notified

## 2016-07-09 NOTE — ED Notes (Signed)
Departure condition note at 11:48 written in error. Patient is still in the department.

## 2016-07-09 NOTE — ED Notes (Signed)
Patient was given two warm blankets.

## 2016-07-09 NOTE — ED Notes (Signed)
Pt breakfast tray delivered. 

## 2016-07-09 NOTE — ED Notes (Signed)
Patient has now gone to dialysis.

## 2016-07-09 NOTE — ED Notes (Signed)
Patient remains in dialysis, but had dialysis RN call for additional snacks.

## 2016-07-09 NOTE — ED Notes (Signed)
Patient is still in room. Dialysis called back and couldn't take her at this time.

## 2016-07-09 NOTE — ED Notes (Signed)
Patient sent with snacks to dialysis per Marijean Niemann, Dialysis RN, request.

## 2016-07-09 NOTE — ED Notes (Signed)
Patient refused finger stick for blood glucose and B/P check at this time. Dr. Quentin Cornwall aware.

## 2016-07-09 NOTE — Progress Notes (Signed)
Subjective:  Patient due for HD today. In good spirit. Case discussed with case management. A group home in [place], [place] as agreed to take pt. Medical director at dialysis unit in [place] to review her case.   Objective:  Vital signs in last 24 hours:  Temp:  [97.9 F (36.6 C)-98.9 F (37.2 C)] 98.9 F (37.2 C) (08/14 0700) Pulse Rate:  [80-88] 80 (08/14 0700) Resp:  [16-20] 16 (08/14 0700) BP: (167-176)/(83-122) 171/122 (08/14 0700) SpO2:  [98 %-100 %] 100 % (08/14 0700)  Weight change:  Filed Weights   07/02/16 1352 07/04/16 0935 07/06/16 1254  Weight: 77.7 kg (171 lb 4.8 oz) 79.3 kg (174 lb 13.2 oz) 79.2 kg (174 lb 9.6 oz)    Intake/Output:   No intake or output data in the 24 hours ending 07/09/16 1210   Physical Exam: General: No acute distress  HEENT Anicteric, moist oral mucous membranes   Neck Supple   Pulm/lungs Normal breathing effort, clear to auscultation   CVS/Heart Regular rhythm, no rub or gallop   Abdomen:  Soft, nontender, nondistended   Extremities: No peripheral edema   Neurologic: Alert, oriented, x 3 follows commands   Skin: No acute rashes   Access: AV fistula        Basic Metabolic Panel:   Recent Labs Lab 07/04/16 0935 07/04/16 1200 07/06/16 1015  NA 136  --  138  K 6.6* 3.4* 4.8  CL 96*  --  99*  CO2 30  --  31  GLUCOSE 162*  --  148*  BUN 37*  --  30*  CREATININE 8.81*  --  6.98*  CALCIUM 9.7  --  8.9  PHOS 4.3  --  2.6     CBC:  Recent Labs Lab 07/04/16 0935 07/06/16 1015  WBC 4.3 2.4*  HGB 8.5* 7.7*  HCT 25.3* 23.1*  MCV 99.3 99.9  PLT 179 147*      Microbiology:  Recent Results (from the past 720 hour(s))  Culture, blood (routine x 2)     Status: None   Collection Time: 06/15/16  4:20 PM  Result Value Ref Range Status   Specimen Description BLOOD ARTERIAL LINE  Final   Special Requests   Final    BOTTLES DRAWN AEROBIC AND ANAEROBIC  AER [place] ANA 5CC   Culture NO GROWTH 5 DAYS  Final   Report Status  06/20/2016 FINAL  Final  Culture, blood (routine x 2)     Status: None   Collection Time: 06/15/16  4:20 PM  Result Value Ref Range Status   Specimen Description BLOOD DRAWN BY DIALYSIS  Final   Special Requests BOTTLES DRAWN AEROBIC AND ANAEROBIC  5CC  Final   Culture NO GROWTH 5 DAYS  Final   Report Status 06/20/2016 FINAL  Final    Coagulation Studies: No results for input(s): LABPROT, INR in the last 72 hours.  Urinalysis: No results for input(s): COLORURINE, LABSPEC, PHURINE, GLUCOSEU, HGBUR, BILIRUBINUR, KETONESUR, PROTEINUR, UROBILINOGEN, NITRITE, LEUKOCYTESUR in the last 72 hours.  Invalid input(s): APPERANCEUR    Imaging: No results found.   Medications:     . acetaminophen  650 mg Oral Once  . amLODipine  10 mg Oral Daily  . aspirin  81 mg Oral Daily  . cloNIDine  0.3 mg Oral BID  . divalproex  1,000 mg Oral QHS  . epoetin (EPOGEN/PROCRIT) injection  10,000 Units Intravenous Q M,W,F-HD  . escitalopram  15 mg Oral Daily  . hydrALAZINE  100 mg  Oral TID  . insulin aspart  0-15 Units Subcutaneous TID WC  . insulin aspart  0-5 Units Subcutaneous QHS  . irbesartan  150 mg Oral QHS  . metoprolol tartrate  50 mg Oral BID  . QUEtiapine  200 mg Oral BID  . sodium polystyrene  30 g Oral Once per day on Tue Thu Sat  . traZODone  100 mg Oral QHS   acetaminophen  Assessment/ Plan:  35 y.o. African-American female with developmental delay, ESRD, AOCD, SHPTH, HTN, schizophrenia, mild cognitive impairment, diabetes mellitus type II  [facility] MWF [facility]  1. ESRD on HD MWF: pt due for dialysis today, will prepare orders.   2. Anemia of CKD:  -continue epogen 10000 units IV with HD.   3. SHPTH:  - not on binders at the moment, continue to periodically monitor phos/pth/Ca  4. Hyperkalemia Continue kayexalate on non HD days, periodically monitor K.    LOS: 1 Kelly [name] 8/14/201712:10 PM

## 2016-07-09 NOTE — Clinical Social Work Note (Signed)
CSW contacted [facility] and spoke with Patty to determine if they received the patient's records from [facility] on [address] here in [place], [place] stated that they had not. Patty then stated I could fax her what we had with fax: [phone]. CSW has contacted Debbie at [facility] on [address]  and asked if they had a request from [place] to fax records to [facility] in [place] and they had not received this request. CSW informed Jackelyn Poling that we needed them to fax records ASAP if possible. Jackelyn Poling stated she would get started on it now. Debbie to call CSW when the records have been faxed and verified as going through. Shela Leff MSW,LCSW [phone]

## 2016-07-09 NOTE — Progress Notes (Signed)
END OF HD TX

## 2016-07-09 NOTE — Progress Notes (Signed)
PRE HD ASSESSMENT 

## 2016-07-09 NOTE — Progress Notes (Signed)
POST HD ASSESSMENT

## 2016-07-09 NOTE — Progress Notes (Signed)
HD TX START

## 2016-07-09 NOTE — Clinical Social Work Note (Signed)
CSW has confirmed with Patty at [facility] [phone]) that they have received the fax and will be sending to their medical director for review. Patty stated that the turn around time is usually about 48 hours. CSW has updated patient's guardian: Derl Barrow [phone] ext: [phone]) and the owner of the [facility]: Tiffany: [phone].  Shela Leff MSW,LCSW [phone]

## 2016-07-09 NOTE — Progress Notes (Signed)
POST HD VITALS

## 2016-07-09 NOTE — ED Notes (Signed)
Pt given saltine crackers, peanut butter and diet sprite

## 2016-07-10 LAB — GLUCOSE, CAPILLARY
GLUCOSE-CAPILLARY: 121 mg/dL — AB (ref 65–99)
GLUCOSE-CAPILLARY: 190 mg/dL — AB (ref 65–99)
GLUCOSE-CAPILLARY: 162 mg/dL — AB (ref 65–99)
Glucose-Capillary: 150 mg/dL — ABNORMAL HIGH (ref 65–99)
Glucose-Capillary: 95 mg/dL (ref 65–99)

## 2016-07-10 MED ORDER — DIPHENHYDRAMINE HCL 25 MG PO CAPS
25.0000 mg | ORAL_CAPSULE | Freq: Once | ORAL | Status: AC
  Administered 2016-07-10: 25 mg via ORAL

## 2016-07-10 MED ORDER — DIPHENHYDRAMINE HCL 25 MG PO CAPS
ORAL_CAPSULE | ORAL | Status: AC
  Administered 2016-07-10: 25 mg via ORAL
  Filled 2016-07-10: qty 1

## 2016-07-10 NOTE — Clinical Social Work Note (Signed)
CSW contacted [facility] to follow up as to whether patient was accepted by the [facility]. CSW was informed by the [facility] that the only thing that they do is to contact the facility and request a chair time, they do not know whether of not patient is accepted. CSW attempted to contact the [facility] multiple times with no answer and only an answering machine. CSW looked online and the center is apparently closed on Tuesdays/Thursdays/Saturdays/Sundays. It appears as thought they only run dialysis patient on MWF schedules. As a result, CSW is unable to follow up today to see if the medical director has accepted patient or not at the [facility]. CSW has updated patient's guardian, Kelly [name], and attempted to reach [place] with the [facility] but she was unable to answer. CSW will call the dialysis center tomorrow to follow up. Shela Leff MSW,LCSW [phone]

## 2016-07-10 NOTE — ED Notes (Signed)
Pt given cereal and milk per her request.

## 2016-07-10 NOTE — ED Notes (Signed)
Pt given PO benadryl for eye swelling.

## 2016-07-10 NOTE — ED Provider Notes (Signed)
-----------------------------------------   6:43 AM on 07/10/2016 -----------------------------------------   BP (!) 148/105 (BP Location: Right Arm)   Pulse 99   Temp 98.6 F (37 C) (Oral)   Resp 16   Ht 5\' 2"  (1.575 m)   Wt 180 lb 8.9 oz (81.9 kg)   SpO2 98%   BMI 33.02 kg/m   Patient did have some eye swelling overnight, given benadryl which did help with some of the swelling. Disposition is pending per Psychiatry/Behavioral Medicine team recommendations.     Nance Pear, MD 07/10/16 [phone]

## 2016-07-10 NOTE — ED Notes (Signed)
Pt is was sleeping. Pt woke up to check Blood sugar and VS. Pt didn't want to explain the need to. Pt agree to let me check them. Pt refused to take BP meds. Pt noted to have swelling to bilateral eyelids. MD made aware.

## 2016-07-10 NOTE — ED Notes (Signed)
Pt is asleep at this moment,will offer snack once pt wakes up.

## 2016-07-10 NOTE — ED Notes (Signed)
Pt sleeping. 

## 2016-07-10 NOTE — ED Notes (Signed)
Pt given breakfast tray

## 2016-07-10 NOTE — ED Notes (Signed)
Report received from [facility]. Per Becky Sax RN pt refused Insulin, Refused blood sugar check, refused vital signs to be checked, and Refused to take BP meds tonight. Pt just wanting to sleep.

## 2016-07-10 NOTE — ED Notes (Signed)
Offered snack to pt,pt did not want a snack at this time.

## 2016-07-11 LAB — RENAL FUNCTION PANEL
ALBUMIN: 3.5 g/dL (ref 3.5–5.0)
ANION GAP: 9 (ref 5–15)
BUN: 45 mg/dL — ABNORMAL HIGH (ref 6–20)
CALCIUM: 9.5 mg/dL (ref 8.9–10.3)
CO2: 31 mmol/L (ref 22–32)
CREATININE: 9.29 mg/dL — AB (ref 0.44–1.00)
Chloride: 97 mmol/L — ABNORMAL LOW (ref 101–111)
GFR, EST AFRICAN AMERICAN: 6 mL/min — AB (ref >60)
GFR, EST NON AFRICAN AMERICAN: 5 mL/min — AB (ref >60)
Glucose, Bld: 181 mg/dL — ABNORMAL HIGH (ref 65–99)
Phosphorus: 3.9 mg/dL (ref 2.5–4.6)
Potassium: 5.9 mmol/L — ABNORMAL HIGH (ref 3.5–5.1)
Sodium: 137 mmol/L (ref 135–145)

## 2016-07-11 LAB — GLUCOSE, CAPILLARY
GLUCOSE-CAPILLARY: 253 mg/dL — AB (ref 65–99)
GLUCOSE-CAPILLARY: 140 mg/dL — AB (ref 65–99)
Glucose-Capillary: 133 mg/dL — ABNORMAL HIGH (ref 65–99)

## 2016-07-11 NOTE — ED Notes (Signed)
Pt refused breakfast tray of french toast sticks. RN notified.

## 2016-07-11 NOTE — Progress Notes (Signed)
   07/11/16 0800  Clinical Encounter Type  Visited With Patient  Visit Type Initial;Behavioral Health  Referral From Patient  Consult/Referral To Chaplain  Spiritual Encounters  Spiritual Needs Prayer  Stress Factors  Patient Stress Factors Exhausted;Family relationships;Health changes  Family Stress Factors Not reviewed  Visited w/patient during rounding. Kelly [name] appeared anxious and agitated. She reported that companions outside of the hospital had attempted to give her illicit drugs and she refused to participate. She requested prayer for strength, which I provided. We discussed her responsibility to care for his physical wellbeing as a means of caring for her mental health. She was reluctant, but agreed to her dialysis treatment and to eat.  Chap. Guynell Kleiber G. [place]

## 2016-07-11 NOTE — ED Provider Notes (Signed)
-----------------------------------------   7:26 AM on 07/11/2016 -----------------------------------------   Blood pressure (!) 190/94, pulse 92, temperature 98.8 F (37.1 C), temperature source Oral, resp. rate 17, height 5\' 2"  (1.575 m), weight 180 lb 8.9 oz (81.9 kg), SpO2 98 %.  The patient had no acute events since last update.  Calm and cooperative at this time.    The patient is still awaiting placement through social work. She should be receiving dialysis today.     Loney Hering, MD 07/11/16 [phone]

## 2016-07-11 NOTE — ED Notes (Signed)
Pt given apple juice  

## 2016-07-11 NOTE — Progress Notes (Signed)
Pre Dialysis 

## 2016-07-11 NOTE — Progress Notes (Signed)
Dialysis started 

## 2016-07-11 NOTE — Progress Notes (Signed)
Post dialysis 

## 2016-07-11 NOTE — ED Notes (Signed)
Pt to dialysis. Report given to Al, Therapist, sports.

## 2016-07-11 NOTE — Progress Notes (Signed)
Dialysis complete

## 2016-07-11 NOTE — Clinical Social Work Note (Addendum)
CSW contacted Patty at [facility] in [place] and was informed that their medical director will not be able to review patient's referral until Friday and that we should have a determination on Friday. Patty did confirm that their center is closed Tues/Thurs/Sat/Sun. CSW has updated both [place] (patient's guardian) and Jonelle Sidle (the [facility] owner). Tiffany informed CSW that [place] and that this may be the reason for the limited dialysis schedule. Shela Leff MSW,LCSW [phone]

## 2016-07-11 NOTE — ED Notes (Signed)
Noted blood to bed sheets and pillow case. Changed bed sheet and pillow case. Gave pt three warm blankets. Saw that blood was coming from pt L arm where fistula is. Rachel,RN notified about this. Supplies was gathered for RN to change dressing.

## 2016-07-11 NOTE — Progress Notes (Signed)
Subjective:  Patient seen and evaluated during hemodialysis today. Potassium noted to be high at 5.9. Patient was on one potassium bath for one hour.  Objective:  Vital signs in last 24 hours:  Temp:  [98.2 F (36.8 C)-98.8 F (37.1 C)] 98.3 F (36.8 C) (08/16 0920) Pulse Rate:  [92-104] 103 (08/16 1200) Resp:  [13-20] 16 (08/16 1200) BP: (135-190)/(79-106) 172/94 (08/16 1200) SpO2:  [98 %-100 %] 100 % (08/16 1200) Weight:  [82.4 kg (181 lb 10.5 oz)] 82.4 kg (181 lb 10.5 oz) (08/16 0920)  Weight change:  Filed Weights   07/06/16 1254 07/09/16 1255 07/11/16 0920  Weight: 79.2 kg (174 lb 9.6 oz) 81.9 kg (180 lb 8.9 oz) 82.4 kg (181 lb 10.5 oz)    Intake/Output:   No intake or output data in the 24 hours ending 07/11/16 1227   Physical Exam: General: No acute distress  HEENT Anicteric, moist oral mucous membranes   Neck Supple   Pulm/lungs Normal breathing effort, clear to auscultation   CVS/Heart Regular rhythm, no rub or gallop   Abdomen:  Soft, nontender, nondistended   Extremities: No peripheral edema   Neurologic: Alert, oriented, x 3 follows commands   Skin: No acute rashes   Access: AV fistula        Basic Metabolic Panel:   Recent Labs Lab 07/06/16 1015 07/11/16 0530  NA 138 137  K 4.8 5.9*  CL 99* 97*  CO2 31 31  GLUCOSE 148* 181*  BUN 30* 45*  CREATININE 6.98* 9.29*  CALCIUM 8.9 9.5  PHOS 2.6 3.9     CBC:  Recent Labs Lab 07/06/16 1015  WBC 2.4*  HGB 7.7*  HCT 23.1*  MCV 99.9  PLT 147*      Microbiology:  Recent Results (from the past 720 hour(s))  Culture, blood (routine x 2)     Status: None   Collection Time: 06/15/16  4:20 PM  Result Value Ref Range Status   Specimen Description BLOOD ARTERIAL LINE  Final   Special Requests   Final    BOTTLES DRAWN AEROBIC AND ANAEROBIC  AER [place] ANA 5CC   Culture NO GROWTH 5 DAYS  Final   Report Status 06/20/2016 FINAL  Final  Culture, blood (routine x 2)     Status: None   Collection  Time: 06/15/16  4:20 PM  Result Value Ref Range Status   Specimen Description BLOOD DRAWN BY DIALYSIS  Final   Special Requests BOTTLES DRAWN AEROBIC AND ANAEROBIC  5CC  Final   Culture NO GROWTH 5 DAYS  Final   Report Status 06/20/2016 FINAL  Final    Coagulation Studies: No results for input(s): LABPROT, INR in the last 72 hours.  Urinalysis: No results for input(s): COLORURINE, LABSPEC, PHURINE, GLUCOSEU, HGBUR, BILIRUBINUR, KETONESUR, PROTEINUR, UROBILINOGEN, NITRITE, LEUKOCYTESUR in the last 72 hours.  Invalid input(s): APPERANCEUR    Imaging: No results found.   Medications:     . acetaminophen  650 mg Oral Once  . amLODipine  10 mg Oral Daily  . aspirin  81 mg Oral Daily  . cloNIDine  0.3 mg Oral BID  . divalproex  1,000 mg Oral QHS  . epoetin (EPOGEN/PROCRIT) injection  10,000 Units Intravenous Q M,W,F-HD  . escitalopram  15 mg Oral Daily  . hydrALAZINE  100 mg Oral TID  . insulin aspart  0-15 Units Subcutaneous TID WC  . insulin aspart  0-5 Units Subcutaneous QHS  . irbesartan  150 mg Oral QHS  .  metoprolol tartrate  50 mg Oral BID  . QUEtiapine  200 mg Oral BID  . sodium polystyrene  30 g Oral Once per day on Tue Thu Sat  . traZODone  100 mg Oral QHS   acetaminophen  Assessment/ Plan:  35 y.o. African-American female with developmental delay, ESRD, AOCD, SHPTH, HTN, schizophrenia, mild cognitive impairment, diabetes mellitus type II  [facility] M[facility]  1. ESRD on HD MWF: patient seen and evaluated during hemodialysis today.  She appears to be tolerating this well.  Complete hemodialysis today and we will plan for dialysis again on Friday.   2. Anemia of CKD:  -continue epogen 10000 units IV with HD.   3. SHPTH:  - phosphorus currently 3.5 and acceptable.  Continue to monitor.  4. Hyperkalemia Potassium was 5.9 today.  Patient was run on a 1K bath for one hour.  Continue to monitor serum potassium.   LOS: 1 Makayla Confer,  Edison Wollschlager 8/16/201712:27 PM

## 2016-07-12 ENCOUNTER — Emergency Department: Payer: MEDICAID

## 2016-07-12 LAB — GLUCOSE, CAPILLARY
GLUCOSE-CAPILLARY: 188 mg/dL — AB (ref 65–99)
GLUCOSE-CAPILLARY: 200 mg/dL — AB (ref 65–99)
GLUCOSE-CAPILLARY: 161 mg/dL — AB (ref 65–99)
Glucose-Capillary: 98 mg/dL (ref 65–99)

## 2016-07-12 LAB — HEPATITIS B SURFACE ANTIGEN: Hepatitis B Surface Ag: NEGATIVE

## 2016-07-12 LAB — HEPATITIS B SURFACE ANTIBODY, QUANTITATIVE: HEPATITIS B-POST: 54.4 m[IU]/mL

## 2016-07-12 NOTE — ED Notes (Signed)
Pt calm and cooperative at this time. Pt standing in hallway speaking to this RN and ODS officer. Pt easily redirected back to her room. Will continue to monitor for further patient needs.

## 2016-07-12 NOTE — ED Notes (Signed)
Pt ambulated out into hallway. Pt directed back into room to have vital signs done and receive 0600 medicine.

## 2016-07-12 NOTE — ED Notes (Signed)
Patient refused shower, notified nurse Jinny Blossom

## 2016-07-12 NOTE — ED Notes (Signed)
BEHAVIORAL HEALTH ROUNDING  Patient sleeping: Yes Patient alert and oriented: Sleeping Behavior appropriate: Yes. ; If no, describe:  Nutrition and fluids offered: No, sleeping  Toileting and hygiene offered: No, sleeping  Sitter present: q15 minute observations and security monitoring  Law enforcement present: Yes ODS 

## 2016-07-12 NOTE — ED Notes (Addendum)
This RN to bedside after hearing a crash. This RN and Maudie Mercury, NT to bedside at this time. Pt found on floor by her bed. Pt immediately able to be assisted back to bed by this RN. Pt c/o R arm pain at this time. Charge RN, and Dr. Quentin Cornwall notified of patient fall. Received verbal orders from MD for arm X-ray. Pt denies hitting her head, no LOC noted. Pt denies reaching for something and falling out of bed, and states she does not know what she was out of bed for. High fall risk bracelet applied, yellow socks applied, high fall risk sign placed outside of patient room, fall pads placed around bed, and bed alarm turned on.

## 2016-07-12 NOTE — ED Notes (Signed)
NAD noted at this time. Resting in bed at this time. Will continue to monitor for further patient needs.

## 2016-07-12 NOTE — ED Notes (Signed)
Pt given one warm blanket.

## 2016-07-12 NOTE — ED Notes (Signed)
Pt provided apple juice to take meds with

## 2016-07-12 NOTE — ED Provider Notes (Signed)
-----------------------------------------   7:04 AM on 07/12/2016 -----------------------------------------   Blood pressure (!) 156/87, pulse 88, temperature 97.9 F (36.6 C), temperature source Oral, resp. rate 18, height 5\' 2"  (1.575 m), weight 177 lb 4 oz (80.4 kg), SpO2 100 %.  The patient had no acute events since last update.  Calm and cooperative at this time.  Disposition is pending placement through [facility].  S//p dialysis yesterday.     Merlyn Lot, MD 07/12/16 [phone]

## 2016-07-12 NOTE — ED Notes (Signed)
NAD noted at this time. Pt given meal tray and sat up in bed. Will continue to monitor at this time.

## 2016-07-12 NOTE — ED Notes (Signed)
BEHAVIORAL HEALTH ROUNDING Patient sleeping: No. Patient alert and oriented: yes Behavior appropriate: Yes.  ; If no, describe:  Nutrition and fluids offered: yes Toileting and hygiene offered: Yes  Sitter present: q15 minute observations and security  monitoring Law enforcement present: Yes  ODS  

## 2016-07-12 NOTE — ED Notes (Signed)
Pt assisted to the bathroom by Maudie Mercury, NT. NAD noted. Will continue to monitor for further patient needs.

## 2016-07-12 NOTE — ED Notes (Signed)
Report given to  Sarah, RN.

## 2016-07-12 NOTE — ED Notes (Signed)
This RN notified by X-ray that patient was refusing X-ray. Kim, NT able to get patient to allow for X-ray of her arm.

## 2016-07-12 NOTE — ED Notes (Signed)
Blood sugar was 188

## 2016-07-12 NOTE — Progress Notes (Signed)
No updates on disposition at this time. Dialysis center transfer continues to delay placement. CSW will inform weekend social worker to follow up tomorrow as dialysis office should be open on Friday.  Kelly [name], MSW, [place]

## 2016-07-12 NOTE — ED Notes (Signed)
Pt given meal tray at this time. Will continue to monitor for further patient needs.

## 2016-07-12 NOTE — ED Notes (Signed)
Pt requests a snack at this time. Will continue to monitor. NAD noted.

## 2016-07-13 LAB — GLUCOSE, CAPILLARY
GLUCOSE-CAPILLARY: 135 mg/dL — AB (ref 65–99)
GLUCOSE-CAPILLARY: 381 mg/dL — AB (ref 65–99)
GLUCOSE-CAPILLARY: 153 mg/dL — AB (ref 65–99)
Glucose-Capillary: 156 mg/dL — ABNORMAL HIGH (ref 65–99)

## 2016-07-13 NOTE — ED Notes (Signed)
BEHAVIORAL HEALTH ROUNDING Patient sleeping: No. Patient alert and oriented: yes Behavior appropriate: Yes.  ; If no, describe:  Nutrition and fluids offered: yes Toileting and hygiene offered: Yes  Sitter present: q15 minute observations and security  monitoring Law enforcement present: Yes  ODS  

## 2016-07-13 NOTE — ED Provider Notes (Signed)
-----------------------------------------   6:44 AM on 07/13/2016 -----------------------------------------   Blood pressure (!) 167/72, pulse 88, temperature 97.6 F (36.4 C), temperature source Oral, resp. rate 16, height 5\' 2"  (1.575 m), weight 177 lb 4 oz (80.4 kg), SpO2 98 %.  The patient had no acute events since last update.  Calm and cooperative at this time.  Disposition is pending per clinical social work recommendations. Patient to have dialysis today.     Paulette Blanch, MD 07/13/16 [phone]

## 2016-07-13 NOTE — ED Notes (Signed)
HD completed  Kelly [name] reports that 3.5K were taken off today  Last BP 185/87   Pt has returned to her room and new ID band has been placed on her right arm

## 2016-07-13 NOTE — ED Notes (Signed)
BEHAVIORAL HEALTH ROUNDING  Patient sleeping: Yes Patient alert and oriented: Sleeping Behavior appropriate: Yes. ; If no, describe:  Nutrition and fluids offered: No, sleeping  Toileting and hygiene offered: No, sleeping  Sitter present: q15 minute observations and security monitoring  Law enforcement present: Yes ODS 

## 2016-07-13 NOTE — ED Notes (Signed)
Meal heated up  Pt sitting up in bed eating now

## 2016-07-13 NOTE — ED Notes (Signed)
Patient observed lying in bed with eyes closed  Even, unlabored respirations observed   NAD pt appears to be sleeping  I will continue to monitor along with every 15 minute visual observations and ongoing security monitoring    

## 2016-07-13 NOTE — ED Notes (Signed)
Breakfast provided.

## 2016-07-13 NOTE — Progress Notes (Signed)
POST HD VITALS

## 2016-07-13 NOTE — Progress Notes (Signed)
HD START 

## 2016-07-13 NOTE — ED Notes (Signed)
2 warm blankets provided

## 2016-07-13 NOTE — Progress Notes (Signed)
Pt arrived for HD today with face/eyelids swollen (appears to be flash edema) and right arm swollen and painful.  Patient armband was cut off because it was too tight. Pt claimed she has been eating hot dogs for lunch two days ago.  She also claimed to fall out of bed in ED yesterday. Floor RN notified.  She is scheduling x-ray of right arm for patient and changing dietary orders.

## 2016-07-13 NOTE — ED Notes (Signed)
ED BHU PLACEMENT JUSTIFICATION Is the patient under IVC or is there intent for IVC:  no   Is the patient medically cleared: Yes.   Is there vacancy in the ED BHU: Yes.   Is the population mix appropriate for patient: Yes.   Is the patient awaiting placement in inpatient or outpatient setting: Yes.   Has the patient had a psychiatric consult: Yes.   Survey of unit performed for contraband, proper placement and condition of furniture, tampering with fixtures in bathroom, shower, and each patient room: Yes.  ; Findings:  APPEARANCE/BEHAVIOR Calm and cooperative NEURO ASSESSMENT Orientation: oriented x3  Denies pain Hallucinations: No.None noted (Hallucinations) Speech: Normal Gait: normal RESPIRATORY ASSESSMENT Even  Unlabored respirations  CARDIOVASCULAR ASSESSMENT Pulses equal   regular rate  Skin warm and dry   GASTROINTESTINAL ASSESSMENT no GI complaint EXTREMITIES Full ROM  PLAN OF CARE Provide calm/safe environment. Vital signs assessed twice daily. ED BHU Assessment once each 12-hour shift. Collaborate with TTS daily or as condition indicates. Assure the ED provider has rounded once each shift. Provide and encourage hygiene. Provide redirection as needed. Assess for escalating behavior; address immediately and inform ED provider.  Assess family dynamic and appropriateness for visitation as needed: Yes.  ; If necessary, describe findings:  Educate the patient/family about BHU procedures/visitation: Yes.  ; If necessary, describe findings:   

## 2016-07-13 NOTE — ED Notes (Signed)

## 2016-07-13 NOTE — ED Notes (Signed)
POC blood glucose = 381

## 2016-07-13 NOTE — Progress Notes (Signed)
END HD TX

## 2016-07-13 NOTE — ED Notes (Signed)
Pt has eaten her breakfast  Report given to [facility] in HD and I have transferred the pt there  Await her return

## 2016-07-13 NOTE — Clinical Social Work Note (Signed)
The [facility] has declined patient and their medical director has stated it would not be a "good fit." CSW has left message for Kelly [name], patient's guardian and has updated Ebony Hail with the group home. Ebony Hail asked if we could find a center in [place], that they would be willing to drive her back and forth. CSW will discuss with Director of [place]. Shela Leff MSW,LCSW [phone]

## 2016-07-13 NOTE — Progress Notes (Signed)
PRE HD ASSESSMENT 

## 2016-07-13 NOTE — ED Notes (Signed)
Pt cbg 153

## 2016-07-13 NOTE — Progress Notes (Signed)
PRE HD INFO 

## 2016-07-13 NOTE — Progress Notes (Signed)
Post hd vitals 

## 2016-07-13 NOTE — Progress Notes (Signed)
Subjective:  Pt seen during dialysis. Significant facial swelling noted.  Blood pressure quite high at the moment.  Eating hot dogs and having fluid indiscretion.  Objective:  Vital signs in last 24 hours:  Temp:  [97.6 F (36.4 C)-97.7 F (36.5 C)] 97.7 F (36.5 C) (08/18 0852) Pulse Rate:  [84-96] 92 (08/18 0920) Resp:  [12-21] 19 (08/18 0920) BP: (160-204)/(72-103) 204/94 (08/18 0920) SpO2:  [96 %-100 %] 100 % (08/18 0920) Weight:  [82.4 kg (181 lb 10.5 oz)] 82.4 kg (181 lb 10.5 oz) (08/18 0852)  Weight change:  Filed Weights   07/11/16 0920 07/11/16 1306 07/13/16 0852  Weight: 82.4 kg (181 lb 10.5 oz) 80.4 kg (177 lb 4 oz) 82.4 kg (181 lb 10.5 oz)    Intake/Output:    Intake/Output Summary (Last 24 hours) at 07/13/16 0940 Last data filed at 07/12/16 2100  Gross per 24 hour  Intake              240 ml  Output                0 ml  Net              240 ml     Physical Exam: General: No acute distress  HEENT Facial swelling noted  Neck Supple   Pulm/lungs Normal breathing effort, clear to auscultation   CVS/Heart Regular rhythm, no rub or gallop   Abdomen:  Soft, nontender, nondistended   Extremities: trace peripheral edema   Neurologic: Alert, oriented, x 3 follows commands   Skin: No acute rashes   Access: AV fistula        Basic Metabolic Panel:   Recent Labs Lab 07/06/16 1015 07/11/16 0530  NA 138 137  K 4.8 5.9*  CL 99* 97*  CO2 31 31  GLUCOSE 148* 181*  BUN 30* 45*  CREATININE 6.98* 9.29*  CALCIUM 8.9 9.5  PHOS 2.6 3.9     CBC:  Recent Labs Lab 07/06/16 1015  WBC 2.4*  HGB 7.7*  HCT 23.1*  MCV 99.9  PLT 147*      Microbiology:  Recent Results (from the past 720 hour(s))  Culture, blood (routine x 2)     Status: None   Collection Time: 06/15/16  4:20 PM  Result Value Ref Range Status   Specimen Description BLOOD ARTERIAL LINE  Final   Special Requests   Final    BOTTLES DRAWN AEROBIC AND ANAEROBIC  AER [place] ANA 5CC   Culture NO GROWTH 5 DAYS  Final   Report Status 06/20/2016 FINAL  Final  Culture, blood (routine x 2)     Status: None   Collection Time: 06/15/16  4:20 PM  Result Value Ref Range Status   Specimen Description BLOOD DRAWN BY DIALYSIS  Final   Special Requests BOTTLES DRAWN AEROBIC AND ANAEROBIC  5CC  Final   Culture NO GROWTH 5 DAYS  Final   Report Status 06/20/2016 FINAL  Final    Coagulation Studies: No results for input(s): LABPROT, INR in the last 72 hours.  Urinalysis: No results for input(s): COLORURINE, LABSPEC, PHURINE, GLUCOSEU, HGBUR, BILIRUBINUR, KETONESUR, PROTEINUR, UROBILINOGEN, NITRITE, LEUKOCYTESUR in the last 72 hours.  Invalid input(s): APPERANCEUR    Imaging: Dg Forearm Right  Result Date: 07/12/2016 CLINICAL DATA:  35 year old female status post fall out of structure. Initial encounter. EXAM: RIGHT FOREARM - 2 VIEW COMPARISON:  Right humerus series from today reported separately. FINDINGS: Calcified peripheral vascular disease in the right forearm. Bone  mineralization is within normal limits. Right radius and ulna appear intact. Grossly normal alignment at the right wrist and elbow. Soft tissue swelling and stranding about the medial distal humerus epicondyles. Distal humerus appears grossly intact. IMPRESSION: 1. No acute fracture or dislocation identified about the right forearm. 2. Soft tissue swelling about the medial elbow may reflect acute soft tissue injury. Right humerus series today reported separately. 3. Calcified peripheral vascular disease in the right forearm likely a sequelae of the patient chronic renal disease. Electronically Signed   By: Genevie Ann M.D.   On: 07/12/2016 10:53   Dg Humerus Right  Result Date: 07/12/2016 CLINICAL DATA:  [facility] out stretcher today, right arm pain EXAM: RIGHT HUMERUS - 2+ VIEW COMPARISON:  None FINDINGS: Two views of the right humerus submitted. No acute fracture or subluxation. A vascular stent is noted in right axilla.  IMPRESSION: Negative. Electronically Signed   By: Lahoma Crocker M.D.   On: 07/12/2016 10:51     Medications:     . acetaminophen  650 mg Oral Once  . amLODipine  10 mg Oral Daily  . aspirin  81 mg Oral Daily  . cloNIDine  0.3 mg Oral BID  . divalproex  1,000 mg Oral QHS  . epoetin (EPOGEN/PROCRIT) injection  10,000 Units Intravenous Q M,W,F-HD  . escitalopram  15 mg Oral Daily  . hydrALAZINE  100 mg Oral TID  . insulin aspart  0-15 Units Subcutaneous TID WC  . insulin aspart  0-5 Units Subcutaneous QHS  . irbesartan  150 mg Oral QHS  . metoprolol tartrate  50 mg Oral BID  . QUEtiapine  200 mg Oral BID  . sodium polystyrene  30 g Oral Once per day on Tue Thu Sat  . traZODone  100 mg Oral QHS   acetaminophen  Assessment/ Plan:  35 y.o. African-American female with developmental delay, ESRD, AOCD, SHPTH, HTN, schizophrenia, mild cognitive impairment, diabetes mellitus type II  [facility] M[facility] [address]  1. ESRD on HD MWF: Patient seen and evaluated during hemodialysis. She is quite fluid overloaded. We will plan for extra dialysis session tomorrow.  2. Anemia of CKD:  -Continue to periodically monitor CBC. Continue Epogen 10,000 units IV with dialysis.   3. SHPTH:  - Phosphorous 3.9 at last check and at target. Continue to monitor.  4. Hyperkalemia  continues to have periodic hyperkalemia despite use of Kayexalate. Significant dietary indiscretion noted.  We have been running the patient on a 1K bath for the first hour followed by 2K bath for the remainder of the treatment.   LOS: 1 Taisa Deloria 8/18/20179:40 AM

## 2016-07-13 NOTE — ED Notes (Signed)
BEHAVIORAL HEALTH ROUNDING Patient sleeping: Yes.   Patient alert and oriented: eyes closed  Appears to be asleep Behavior appropriate: Yes.  ; If no, describe:  Nutrition and fluids offered: Yes  Toileting and hygiene offered: sleeping Sitter present: q 15 minute observations and security monitoring Law enforcement present: yes  ODS 

## 2016-07-14 LAB — GLUCOSE, CAPILLARY
GLUCOSE-CAPILLARY: 190 mg/dL — AB (ref 65–99)
GLUCOSE-CAPILLARY: 278 mg/dL — AB (ref 65–99)
Glucose-Capillary: 150 mg/dL — ABNORMAL HIGH (ref 65–99)
Glucose-Capillary: 221 mg/dL — ABNORMAL HIGH (ref 65–99)

## 2016-07-14 LAB — CBC
HCT: 25.7 % — ABNORMAL LOW (ref 35.0–47.0)
HEMOGLOBIN: 8.6 g/dL — AB (ref 12.0–16.0)
MCH: 33.3 pg (ref 26.0–34.0)
MCHC: 33.3 g/dL (ref 32.0–36.0)
MCV: 100 fL (ref 80.0–100.0)
PLATELETS: 176 10*3/uL (ref 150–440)
RBC: 2.57 MIL/uL — AB (ref 3.80–5.20)
RDW: 15.6 % — ABNORMAL HIGH (ref 11.5–14.5)
WBC: 3.7 10*3/uL (ref 3.6–11.0)

## 2016-07-14 MED ORDER — CHOLECALCIFEROL 10 MCG (400 UNIT) PO TABS
800.0000 [IU] | ORAL_TABLET | Freq: Every day | ORAL | Status: DC
  Administered 2016-07-15 – 2016-08-02 (×19): 800 [IU] via ORAL
  Filled 2016-07-14 (×19): qty 2

## 2016-07-14 NOTE — ED Notes (Signed)
Report given to [facility] in Dialysis. Pt escorted to dialysis via wheelchair by tech

## 2016-07-14 NOTE — ED Notes (Signed)
Nurse speaks with Kelly [name] in Dialysis, Pt to have dialysis.

## 2016-07-14 NOTE — ED Provider Notes (Signed)
-----------------------------------------   6:52 AM on 07/14/2016 -----------------------------------------   Blood pressure (!) 160/94, pulse 87, temperature 97.6 F (36.4 C), temperature source Oral, resp. rate 18, height 5\' 2"  (1.575 m), weight 172 lb 6.4 oz (78.2 kg), SpO2 97 %.  The patient had no acute events since last update.  Calm and cooperative at this time.    The patient is awaiting social work placement.     Loney Hering, MD 07/14/16 [phone]

## 2016-07-14 NOTE — Progress Notes (Signed)
Subjective:  Patient seen and evaluated during hemodialysis. She is undergoing active treatment for volume overload. She still has significant facial swelling.   Objective:  Vital signs in last 24 hours:  Temp:  [97.6 F (36.4 C)-98.6 F (37 C)] 97.6 F (36.4 C) (08/19 0651) Pulse Rate:  [87-108] 87 (08/19 0651) Resp:  [16-33] 18 (08/19 0651) BP: (158-207)/(80-136) 160/94 (08/19 0651) SpO2:  [96 %-100 %] 97 % (08/19 0651) Weight:  [78.2 kg (172 lb 6.4 oz)] 78.2 kg (172 lb 6.4 oz) (08/18 1225)  Weight change:  Filed Weights   07/11/16 1306 07/13/16 0852 07/13/16 1225  Weight: 80.4 kg (177 lb 4 oz) 82.4 kg (181 lb 10.5 oz) 78.2 kg (172 lb 6.4 oz)    Intake/Output:    Intake/Output Summary (Last 24 hours) at 07/14/16 1012 Last data filed at 07/13/16 1225  Gross per 24 hour  Intake                0 ml  Output             3500 ml  Net            -3500 ml     Physical Exam: General: No acute distress  HEENT Facial swelling noted, periorbital edema  Neck Supple   Pulm/lungs Normal breathing effort, clear to auscultation   CVS/Heart Regular rhythm, no rub or gallop   Abdomen:  Soft, nontender, nondistended   Extremities: trace peripheral edema   Neurologic: Alert, oriented, x 3 follows commands   Skin: No acute rashes   Access: AV fistula        Basic Metabolic Panel:   Recent Labs Lab 07/11/16 0530  NA 137  K 5.9*  CL 97*  CO2 31  GLUCOSE 181*  BUN 45*  CREATININE 9.29*  CALCIUM 9.5  PHOS 3.9     CBC: No results for input(s): WBC, NEUTROABS, HGB, HCT, MCV, PLT in the last 168 hours.    Microbiology:  Recent Results (from the past 720 hour(s))  Culture, blood (routine x 2)     Status: None   Collection Time: 06/15/16  4:20 PM  Result Value Ref Range Status   Specimen Description BLOOD ARTERIAL LINE  Final   Special Requests   Final    BOTTLES DRAWN AEROBIC AND ANAEROBIC  AER [place] ANA 5CC   Culture NO GROWTH 5 DAYS  Final   Report Status  06/20/2016 FINAL  Final  Culture, blood (routine x 2)     Status: None   Collection Time: 06/15/16  4:20 PM  Result Value Ref Range Status   Specimen Description BLOOD DRAWN BY DIALYSIS  Final   Special Requests BOTTLES DRAWN AEROBIC AND ANAEROBIC  5CC  Final   Culture NO GROWTH 5 DAYS  Final   Report Status 06/20/2016 FINAL  Final    Coagulation Studies: No results for input(s): LABPROT, INR in the last 72 hours.  Urinalysis: No results for input(s): COLORURINE, LABSPEC, PHURINE, GLUCOSEU, HGBUR, BILIRUBINUR, KETONESUR, PROTEINUR, UROBILINOGEN, NITRITE, LEUKOCYTESUR in the last 72 hours.  Invalid input(s): APPERANCEUR    Imaging: Dg Forearm Right  Result Date: 07/12/2016 CLINICAL DATA:  35 year old female status post fall out of structure. Initial encounter. EXAM: RIGHT FOREARM - 2 VIEW COMPARISON:  Right humerus series from today reported separately. FINDINGS: Calcified peripheral vascular disease in the right forearm. Bone mineralization is within normal limits. Right radius and ulna appear intact. Grossly normal alignment at the right wrist and elbow. Soft tissue  swelling and stranding about the medial distal humerus epicondyles. Distal humerus appears grossly intact. IMPRESSION: 1. No acute fracture or dislocation identified about the right forearm. 2. Soft tissue swelling about the medial elbow may reflect acute soft tissue injury. Right humerus series today reported separately. 3. Calcified peripheral vascular disease in the right forearm likely a sequelae of the patient chronic renal disease. Electronically Signed   By: Genevie Ann M.D.   On: 07/12/2016 10:53   Dg Humerus Right  Result Date: 07/12/2016 CLINICAL DATA:  [facility] out stretcher today, right arm pain EXAM: RIGHT HUMERUS - 2+ VIEW COMPARISON:  None FINDINGS: Two views of the right humerus submitted. No acute fracture or subluxation. A vascular stent is noted in right axilla. IMPRESSION: Negative. Electronically Signed   By: Lahoma Crocker M.D.   On: 07/12/2016 10:51     Medications:     . acetaminophen  650 mg Oral Once  . amLODipine  10 mg Oral Daily  . aspirin  81 mg Oral Daily  . cloNIDine  0.3 mg Oral BID  . divalproex  1,000 mg Oral QHS  . epoetin (EPOGEN/PROCRIT) injection  10,000 Units Intravenous Q M,W,F-HD  . escitalopram  15 mg Oral Daily  . hydrALAZINE  100 mg Oral TID  . insulin aspart  0-15 Units Subcutaneous TID WC  . insulin aspart  0-5 Units Subcutaneous QHS  . irbesartan  150 mg Oral QHS  . metoprolol tartrate  50 mg Oral BID  . QUEtiapine  200 mg Oral BID  . sodium polystyrene  30 g Oral Once per day on Tue Thu Sat  . traZODone  100 mg Oral QHS   acetaminophen  Assessment/ Plan:  35 y.o. African-American female with developmental delay, ESRD, AOCD, SHPTH, HTN, schizophrenia, mild cognitive impairment, diabetes mellitus type II  [facility] M[facility]  1. ESRD on HD MWF: Patient being dialyzed extra today due to ongoing edema particularly in the facial region. Ultrafiltration target today is 4 kg.  2. Anemia of CKD:  -Check CBC today.   3. SHPTH:  - Repeat phosphorus today. Patient not on binders therapy at the moment.  4. Hyperkalemia  We will repeat serum potassium today. She is undergoing dialysis at the moment which should help to bring down her potassium.   LOS: 1 Charnele Semple 8/19/201710:12 AM

## 2016-07-14 NOTE — Progress Notes (Signed)
HD STARTED  

## 2016-07-14 NOTE — Progress Notes (Signed)
POST DIALYSIS ASSESSMENT 

## 2016-07-14 NOTE — ED Notes (Signed)
Reports received from [place], [place] in Dialysis. 3L removed from pt today. Pt brought back to room 23

## 2016-07-14 NOTE — ED Notes (Signed)
Pt states that are plenty of

## 2016-07-14 NOTE — ED Notes (Signed)
Breakfast tray given. °

## 2016-07-14 NOTE — Progress Notes (Signed)
PRE DIALYSIS ASSESSMENT 

## 2016-07-14 NOTE — Progress Notes (Signed)
HD COMPLETED  

## 2016-07-15 LAB — RENAL FUNCTION PANEL
ANION GAP: 8 (ref 5–15)
Albumin: 3.1 g/dL — ABNORMAL LOW (ref 3.5–5.0)
BUN: 28 mg/dL — AB (ref 6–20)
CALCIUM: 9.3 mg/dL (ref 8.9–10.3)
CO2: 32 mmol/L (ref 22–32)
CREATININE: 5.76 mg/dL — AB (ref 0.44–1.00)
Chloride: 98 mmol/L — ABNORMAL LOW (ref 101–111)
GFR calc Af Amer: 10 mL/min — ABNORMAL LOW (ref >60)
GFR calc non Af Amer: 9 mL/min — ABNORMAL LOW (ref >60)
GLUCOSE: 212 mg/dL — AB (ref 65–99)
Phosphorus: 3.6 mg/dL (ref 2.5–4.6)
Potassium: 4.9 mmol/L (ref 3.5–5.1)
SODIUM: 138 mmol/L (ref 135–145)

## 2016-07-15 LAB — GLUCOSE, CAPILLARY
GLUCOSE-CAPILLARY: 250 mg/dL — AB (ref 65–99)
GLUCOSE-CAPILLARY: 177 mg/dL — AB (ref 65–99)
GLUCOSE-CAPILLARY: 153 mg/dL — AB (ref 65–99)
Glucose-Capillary: 159 mg/dL — ABNORMAL HIGH (ref 65–99)

## 2016-07-15 MED ORDER — IRBESARTAN 150 MG PO TABS
ORAL_TABLET | ORAL | Status: AC
  Filled 2016-07-15: qty 1

## 2016-07-15 NOTE — ED Notes (Signed)

## 2016-07-15 NOTE — ED Notes (Signed)
BEHAVIORAL HEALTH ROUNDING Patient sleeping: Yes.   Patient alert and oriented: not applicable SLEEPING Behavior appropriate: Yes.  ; If no, describe: SLEEPING Nutrition and fluids offered: No SLEEPING Toileting and hygiene offered: NoSLEEPING Sitter present: not applicable, Q 15 min safety rounds and observation. Law enforcement present: Yes ODS 

## 2016-07-15 NOTE — ED Notes (Signed)
Pt eating breakfast at this time.  

## 2016-07-15 NOTE — ED Notes (Signed)
Pt to [employer]

## 2016-07-15 NOTE — ED Notes (Signed)
Insulin drawer broken in pyxis pharmacy made aware and insulin requested for patient.

## 2016-07-15 NOTE — ED Provider Notes (Signed)
-----------------------------------------   6:36 AM on 07/15/2016 -----------------------------------------   Blood pressure (!) 196/100, pulse (!) 101, temperature 98.2 F (36.8 C), resp. rate 18, height 5\' 2"  (1.575 m), weight 167 lb (75.8 kg), SpO2 99 %.  The patient had no acute events since last update.  Calm and cooperative at this time.  Disposition is pending per social work team recommendations.     Paulette Blanch, MD 07/15/16 [phone]

## 2016-07-16 LAB — GLUCOSE, CAPILLARY
GLUCOSE-CAPILLARY: 274 mg/dL — AB (ref 65–99)
GLUCOSE-CAPILLARY: 122 mg/dL — AB (ref 65–99)
Glucose-Capillary: 192 mg/dL — ABNORMAL HIGH (ref 65–99)
Glucose-Capillary: 212 mg/dL — ABNORMAL HIGH (ref 65–99)

## 2016-07-16 NOTE — ED Notes (Signed)
VOL/Pending placement 

## 2016-07-16 NOTE — ED Provider Notes (Signed)
-----------------------------------------   6:32 AM on 07/16/2016 -----------------------------------------   Blood pressure (!) 173/100, pulse 89, temperature 98.4 F (36.9 C), temperature source Oral, resp. rate 16, height 5\' 2"  (1.575 m), weight 167 lb (75.8 kg), SpO2 98 %.  The patient had no acute events since last update.  Calm and cooperative at this time.   Patient still awaiting acceptance to a facility. Patient should receive dialysis today.     Loney Hering, MD 07/16/16 [phone]

## 2016-07-16 NOTE — Clinical Social Work Note (Signed)
CSW spoke with Dr. Abigail Butts this morning and he gave two dialysis centers that are near [place]: [place] and [facility]. CSW will make contact with these to see if they can consider patient. Meanwhile, Dr. Abigail Butts is going to contact [facility] to advocate for patient's situation and see if they will assist [place] with the outpatient dialysis patient will require. CSW has also staffed case with Director of CSW to discuss further options which are currently quite limited. Shela Leff MSW,LCSW

## 2016-07-16 NOTE — ED Notes (Signed)
BEHAVIORAL HEALTH ROUNDING Patient sleeping: Yes.   Patient alert and oriented: not applicable SLEEPING Behavior appropriate: Yes.  ; If no, describe: SLEEPING Nutrition and fluids offered: No SLEEPING Toileting and hygiene offered: NoSLEEPING Sitter present: not applicable, Q 15 min safety rounds and observation. Law enforcement present: Yes ODS 

## 2016-07-16 NOTE — ED Notes (Signed)
Pt given bag of chips and diet sprite.

## 2016-07-16 NOTE — Progress Notes (Signed)
Dialysis started 

## 2016-07-16 NOTE — Progress Notes (Signed)
Post dialysis 

## 2016-07-16 NOTE — ED Notes (Signed)
Patient assigned to appropriate care area. Patient oriented to unit/care area: Informed that, for their safety, care areas are designed for safety and monitored by security cameras at all times; and visiting hours explained to patient. Patient verbalizes understanding, and verbal contract for safety obtained. 

## 2016-07-16 NOTE — Progress Notes (Signed)
Subjective:   Seen and examined on hemodialysis. Tolerating treatment well.   Given kayexalate   Objective:  Vital signs in last 24 hours:  Temp:  [98 F (36.7 C)-98.4 F (36.9 C)] 98 F (36.7 C) (08/21 0945) Pulse Rate:  [82-93] 93 (08/21 1030) Resp:  [14-18] 14 (08/21 1030) BP: (142-173)/(77-100) 166/82 (08/21 1030) SpO2:  [95 %-100 %] 100 % (08/21 1030) Weight:  [79.4 kg (175 lb 0.7 oz)] 79.4 kg (175 lb 0.7 oz) (08/21 0945)  Weight change:  Filed Weights   07/13/16 1225 07/14/16 1345 07/16/16 0945  Weight: 78.2 kg (172 lb 6.4 oz) 75.8 kg (167 lb) 79.4 kg (175 lb 0.7 oz)    Intake/Output:    Intake/Output Summary (Last 24 hours) at 07/16/16 1108 Last data filed at 07/16/16 0930  Gross per 24 hour  Intake              360 ml  Output                0 ml  Net              360 ml     Physical Exam: General: No acute distress  HEENT Some edema  Neck Supple   Pulm/lungs Normal breathing effort, clear to auscultation   CVS/Heart Regular rhythm, no rub or gallop   Abdomen:  Soft, nontender, nondistended   Extremities: trace peripheral edema   Neurologic: Alert, oriented, x 3 follows commands   Skin: No acute rashes   Access: AV fistula        Basic Metabolic Panel:   Recent Labs Lab 07/11/16 0530 07/14/16 1009  NA 137 138  K 5.9* 4.9  CL 97* 98*  CO2 31 32  GLUCOSE 181* 212*  BUN 45* 28*  CREATININE 9.29* 5.76*  CALCIUM 9.5 9.3  PHOS 3.9 3.6     CBC:  Recent Labs Lab 07/14/16 1009  WBC 3.7  HGB 8.6*  HCT 25.7*  MCV 100.0  PLT 176      Microbiology:  No results found for this or any previous visit (from the past 720 hour(s)).  Coagulation Studies: No results for input(s): LABPROT, INR in the last 72 hours.  Urinalysis: No results for input(s): COLORURINE, LABSPEC, PHURINE, GLUCOSEU, HGBUR, BILIRUBINUR, KETONESUR, PROTEINUR, UROBILINOGEN, NITRITE, LEUKOCYTESUR in the last 72 hours.  Invalid input(s): APPERANCEUR     Imaging: No results found.   Medications:     . acetaminophen  650 mg Oral Once  . amLODipine  10 mg Oral Daily  . aspirin  81 mg Oral Daily  . cholecalciferol  800 Units Oral Daily  . cloNIDine  0.3 mg Oral BID  . divalproex  1,000 mg Oral QHS  . epoetin (EPOGEN/PROCRIT) injection  10,000 Units Intravenous Q M,W,F-HD  . escitalopram  15 mg Oral Daily  . hydrALAZINE  100 mg Oral TID  . insulin aspart  0-15 Units Subcutaneous TID WC  . insulin aspart  0-5 Units Subcutaneous QHS  . irbesartan  150 mg Oral QHS  . metoprolol tartrate  50 mg Oral BID  . QUEtiapine  200 mg Oral BID  . traZODone  100 mg Oral QHS   acetaminophen  Assessment/ Plan:  35 y.o. African-American female with developmental delay, ESRD, AOCD, SHPTH, HTN, schizophrenia, mild cognitive impairment, diabetes mellitus type II  [facility] M[facility] [address]  1. ESRD on HD MWF: Seen and examined on hemodialysis. Tolerating treatment well.  - Continue MWF schedule - Discontinue kayexalate -  Working on discharge. Have discussed case with [facility] in [place], [place]. [place] called back from the medical director.   2. Anemia of CKD:  -epo with treatment  3. SHPTH: not currently on binders. Phos at goal.   4. Hypertension: not well controlled.  - irbesartan, metoprolol, hydralazine, clonidine and amlodipine.    LOS: [place], [place] 8/21/201711:08 AM

## 2016-07-16 NOTE — ED Notes (Signed)
CBG blood glucose 122.

## 2016-07-16 NOTE — Progress Notes (Signed)
Dialysis complete

## 2016-07-16 NOTE — ED Notes (Signed)
Per dialysis hold all meds

## 2016-07-16 NOTE — Progress Notes (Signed)
Pre dialysis  

## 2016-07-16 NOTE — ED Notes (Signed)
ED BHU [place] Is the patient under IVC or is there intent for IVC: Yes.   Is the patient medically cleared: Yes.   Is there vacancy in the ED BHU: Yes.   Is the population mix appropriate for patient: Yes.   Is the patient awaiting placement in inpatient or outpatient setting: Yes.   Has the patient had a psychiatric consult: Yes.   Survey of unit performed for contraband, proper placement and condition of furniture, tampering with fixtures in bathroom, shower, and each patient room: Yes.  ; Findings: None.  APPEARANCE/BEHAVIOR calm, cooperative and adequate rapport can be established NEURO ASSESSMENT Orientation: place and person Hallucinations: No.None noted (Hallucinations) Speech: Garbled Gait: unsteady RESPIRATORY ASSESSMENT Normal expansion.  Clear to auscultation.  No rales, rhonchi, or wheezing., No chest wall tenderness., No kyphosis or scoliosis., Breathing Pattern: regular, Breath sounds: clear. CARDIOVASCULAR ASSESSMENT regular rate and rhythm, S1, S2 normal, no murmur, click, rub or gallop GASTROINTESTINAL ASSESSMENT soft, nontender, BS WNL, no r/g EXTREMITIES normal strength, tone, and muscle mass, no deformities, no erythema, induration, or nodules, no evidence of joint effusion, ROM of all joints is normal, no evidence of joint instability PLAN OF CARE Provide calm/safe environment. Vital signs assessed twice daily. ED BHU Assessment once each 12-hour shift. Collaborate with intake RN daily or as condition indicates. Assure the ED provider has rounded once each shift. Provide and encourage hygiene. Provide redirection as needed. Assess for escalating behavior; address immediately and inform ED provider.  Assess family dynamic and appropriateness for visitation as needed: No.; If necessary, describe findings: Family not present, family dynamics were not assessed.  Educate the patient/family about BHU procedures/visitation: Yes.  ; If necessary, describe  findings: Pt understands the procedures of the BHU/QUAD.

## 2016-07-16 NOTE — ED Notes (Signed)
Pt transported to dialysis

## 2016-07-16 NOTE — Progress Notes (Signed)
Pre Dialysis 

## 2016-07-17 LAB — GLUCOSE, CAPILLARY
GLUCOSE-CAPILLARY: 171 mg/dL — AB (ref 65–99)
GLUCOSE-CAPILLARY: 143 mg/dL — AB (ref 65–99)
GLUCOSE-CAPILLARY: 191 mg/dL — AB (ref 65–99)
Glucose-Capillary: 213 mg/dL — ABNORMAL HIGH (ref 65–99)

## 2016-07-17 NOTE — Progress Notes (Signed)
Inpatient Diabetes Program Recommendations  AACE/ADA: New Consensus Statement on Inpatient Glycemic Control (2015)  Target Ranges:  Prepandial:   less than 140 mg/dL      Peak postprandial:   less than 180 mg/dL (1-2 hours)      Critically ill patients:  140 - 180 mg/dL   Lab Results  Component Value Date   GLUCAP 171 (H) 07/17/2016   HGBA1C 5.7 10/07/2012   Results for RAGINA, GORDILLO (MRN [id number]) as of 07/17/2016 10:52  Ref. Range 07/16/2016 07:26 07/16/2016 14:32 07/16/2016 18:31 07/16/2016 22:03 07/17/2016 08:52  Glucose-Capillary Latest Ref Range: 65 - 99 mg/dL 192 (H) 212 (H) 274 (H) 122 (H) 171 (H)    Diabetes history: Type 2 Outpatient Diabetes medications: Novolog 0-12 units tid Current orders for Inpatient glycemic control: Novolog 0-15 units tid, Novolog 0-5 units qhs  Inpatient Diabetes Program Recommendations: Some elevations of blood sugar yesterday and consistently slightly elevated fasting blood sugars during her admission BUT patient has also had some low blood sugars especially on the days that she has dialysis- continue to evaluate- if sugars continue to climb, would add Lantus 5 units qhs.  Gentry Fitz, RN, BA, MHA, CDE Diabetes Coordinator Inpatient Diabetes Program  [phone] (Team Pager) [phone] ([place]) 07/17/2016 11:02 AM

## 2016-07-17 NOTE — ED Notes (Signed)
Lunch given to patient.

## 2016-07-17 NOTE — ED Provider Notes (Signed)
-----------------------------------------   6:51 AM on 07/17/2016 -----------------------------------------   Blood pressure 131/83, pulse 79, temperature 98.3 F (36.8 C), temperature source Oral, resp. rate 16, height 5\' 2"  (1.575 m), weight 166 lb 0.1 oz (75.3 kg), SpO2 100 %.  The patient had no acute events since last update.  Calm and cooperative at this time.    Patient received dialysis yesterday. She is still awaiting placement.     Loney Hering, MD 07/17/16 [phone]

## 2016-07-17 NOTE — Clinical Social Work Note (Signed)
CSW has contacted the [place] for the [place] area. The Kelly [name] is [name]: [phone]. CSW discussed patient's situation and that she had been declined by the [name] facility. Ms. Elsworth Soho did state that none of her facilities have patients that require a sitter. CSW asked if she could look at any of her area dialysis centers as potential candidates. Ms. Elsworth Soho agreed to review and investigate and requested records for her medical director to review. CSW offered for our nephrologist to speak with anyone to give more insight into patient's situation. CSW contacted Debbie at [facility] here in [place] and requested her to fax patient's medical records to Ms. Rubio at [phone]. Shela Leff MSW,LCSW [phone]

## 2016-07-17 NOTE — ED Notes (Signed)
Pt given cereal and milk.  

## 2016-07-17 NOTE — ED Notes (Signed)
Blood glucose 213. RN notified.

## 2016-07-18 LAB — RENAL FUNCTION PANEL
ANION GAP: 14 (ref 5–15)
Albumin: 3.6 g/dL (ref 3.5–5.0)
BUN: 64 mg/dL — AB (ref 6–20)
CHLORIDE: 94 mmol/L — AB (ref 101–111)
CO2: 26 mmol/L (ref 22–32)
Calcium: 9.5 mg/dL (ref 8.9–10.3)
Creatinine, Ser: 7.74 mg/dL — ABNORMAL HIGH (ref 0.44–1.00)
GFR calc Af Amer: 7 mL/min — ABNORMAL LOW (ref >60)
GFR, EST NON AFRICAN AMERICAN: 6 mL/min — AB (ref >60)
GLUCOSE: 257 mg/dL — AB (ref 65–99)
POTASSIUM: 6.3 mmol/L — AB (ref 3.5–5.1)
Phosphorus: 3.2 mg/dL (ref 2.5–4.6)
Sodium: 134 mmol/L — ABNORMAL LOW (ref 135–145)

## 2016-07-18 LAB — GLUCOSE, CAPILLARY
GLUCOSE-CAPILLARY: 245 mg/dL — AB (ref 65–99)
Glucose-Capillary: 168 mg/dL — ABNORMAL HIGH (ref 65–99)
Glucose-Capillary: 84 mg/dL (ref 65–99)
Glucose-Capillary: 277 mg/dL — ABNORMAL HIGH (ref 65–99)

## 2016-07-18 LAB — CBC
HEMATOCRIT: 28.8 % — AB (ref 35.0–47.0)
HEMOGLOBIN: 9.5 g/dL — AB (ref 12.0–16.0)
MCH: 32.9 pg (ref 26.0–34.0)
MCHC: 33.1 g/dL (ref 32.0–36.0)
MCV: 99.4 fL (ref 80.0–100.0)
Platelets: 173 10*3/uL (ref 150–440)
RBC: 2.9 MIL/uL — ABNORMAL LOW (ref 3.80–5.20)
RDW: 14.9 % — AB (ref 11.5–14.5)
WBC: 3.8 10*3/uL (ref 3.6–11.0)

## 2016-07-18 NOTE — ED Notes (Signed)
Lunch tray placed in pt room. Pt in dialysis. Trash removed from pt room.

## 2016-07-18 NOTE — ED Notes (Signed)
Pt refused to take meds with water; gave her apple juice with medications; anti-HTN meds held per dialysis protocol

## 2016-07-18 NOTE — Progress Notes (Signed)
POST HD VITALS

## 2016-07-18 NOTE — Progress Notes (Signed)
Pre hd info 

## 2016-07-18 NOTE — ED Notes (Signed)
Pt given breakfast tray after insulin of 3 units per sliding scale.

## 2016-07-18 NOTE — ED Notes (Signed)
Gave pt 2 blankets.

## 2016-07-18 NOTE — Progress Notes (Signed)
  Subjective:   Seen and examined on hemodialysis.   Blood pressure elevated  Objective:  Vital signs in last 24 hours:  Temp:  [97.9 F (36.6 C)-99.7 F (37.6 C)] 97.9 F (36.6 C) (08/23 1040) Pulse Rate:  [74-96] 96 (08/23 1100) Resp:  [16-18] 18 (08/23 1100) BP: (123-168)/(60-96) 168/82 (08/23 1100) SpO2:  [98 %-100 %] 100 % (08/23 1100) Weight:  [78.5 kg (173 lb 1 oz)] 78.5 kg (173 lb 1 oz) (08/23 1040)  Weight change:  Filed Weights   07/16/16 0945 07/16/16 1346 07/18/16 1040  Weight: 79.4 kg (175 lb 0.7 oz) 75.3 kg (166 lb 0.1 oz) 78.5 kg (173 lb 1 oz)    Intake/Output:   No intake or output data in the 24 hours ending 07/18/16 1120   Physical Exam: General: No acute distress  HEENT [place]?AT  Neck Supple   Pulm/lungs Normal breathing effort, clear to auscultation   CVS/Heart Regular rhythm, no rub or gallop   Abdomen:  Soft, nontender, nondistended   Extremities: trace peripheral edema   Neurologic: Alert, oriented, x 3 follows commands   Skin: No acute rashes   Access: AV fistula        Basic Metabolic Panel:   Recent Labs Lab 07/14/16 1009  NA 138  K 4.9  CL 98*  CO2 32  GLUCOSE 212*  BUN 28*  CREATININE 5.76*  CALCIUM 9.3  PHOS 3.6     CBC:  Recent Labs Lab 07/14/16 1009  WBC 3.7  HGB 8.6*  HCT 25.7*  MCV 100.0  PLT 176      Microbiology:  No results found for this or any previous visit (from the past 720 hour(s)).  Coagulation Studies: No results for input(s): LABPROT, INR in the last 72 hours.  Urinalysis: No results for input(s): COLORURINE, LABSPEC, PHURINE, GLUCOSEU, HGBUR, BILIRUBINUR, KETONESUR, PROTEINUR, UROBILINOGEN, NITRITE, LEUKOCYTESUR in the last 72 hours.  Invalid input(s): APPERANCEUR    Imaging: No results found.   Medications:     . acetaminophen  650 mg Oral Once  . amLODipine  10 mg Oral Daily  . aspirin  81 mg Oral Daily  . cholecalciferol  800 Units Oral Daily  . cloNIDine  0.3 mg Oral BID   . divalproex  1,000 mg Oral QHS  . epoetin (EPOGEN/PROCRIT) injection  10,000 Units Intravenous Q M,W,F-HD  . escitalopram  15 mg Oral Daily  . hydrALAZINE  100 mg Oral TID  . insulin aspart  0-15 Units Subcutaneous TID WC  . insulin aspart  0-5 Units Subcutaneous QHS  . irbesartan  150 mg Oral QHS  . metoprolol tartrate  50 mg Oral BID  . QUEtiapine  200 mg Oral BID  . traZODone  100 mg Oral QHS   acetaminophen  Assessment/ Plan:  35 y.o. African-American female with developmental delay, ESRD, AOCD, SHPTH, HTN, schizophrenia, mild cognitive impairment, diabetes mellitus type II  [facility] MWF [facility]  1. ESRD on HD MWF with hyperkalemia: Seen and examined on hemodialysis. Tolerating treatment well.  - Continue MWF schedule - Discontinued kayexalate - Working on discharge. Have discussed case with [facility] in [place], [place]. Awating approval.   2. Anemia of CKD:  -epo with treatment  3. SHPTH: not currently on binders. Phos at goal.   4. Hypertension: not well controlled.  - irbesartan, metoprolol, hydralazine, clonidine and amlodipine.    LOS: 1 Duc Crocket 8/23/201711:20 AM

## 2016-07-18 NOTE — Clinical Social Work Note (Addendum)
CSW has run into many road blocks regarding trying to re-establish patient's outpatient dialysis in the area that the family care home is located. Dr. Abigail Butts has reached out to [facility] but has not been successful with their follow up. The [facility] Director Dennie Bible: [phone] this afternoon to say that the medical director for [facility] in [place] counties has also declined patient. I requested that she send patient's referral to her other area which is the entire city of [place]. She is making that referral today but CSW is not hopeful. In addition, CSW has contacted [facility] and their Sales promotion account executive did not want the referral and said they would not take patient due to her requiring a sitter as they deemed this a liability. CSW has staffed the case with Daphene Calamity, [place] Director who is going to reach out to the Director of patient's LME.  Shela Leff MSW,LCSW [phone]

## 2016-07-18 NOTE — Progress Notes (Signed)
This note also relates to the following rows which could not be included: Pulse Rate - Cannot attach notes to unvalidated device data SpO2 - Cannot attach notes to unvalidated device data  END OF HD [place]

## 2016-07-18 NOTE — ED Notes (Signed)
Pt to dialysis.

## 2016-07-18 NOTE — ED Notes (Signed)
Report given to [place], RN from dialysis. Pt should have been placed on renal diet and fluid restriction prior to today.

## 2016-07-18 NOTE — Progress Notes (Signed)
Pre hd assessment  

## 2016-07-18 NOTE — Progress Notes (Signed)
Hd start 

## 2016-07-18 NOTE — ED Provider Notes (Signed)
-----------------------------------------   7:14 AM on 07/18/2016 -----------------------------------------   Blood pressure 137/78, pulse 80, temperature 98.1 F (36.7 C), temperature source Oral, resp. rate 18, height 5\' 2"  (1.575 m), weight 166 lb 0.1 oz (75.3 kg), SpO2 98 %.  The patient had no acute events since last update.  Calm and cooperative at this time. Should go for dialysis today. Placement pending.   Paulette Blanch, MD 07/18/16 [phone]

## 2016-07-19 LAB — GLUCOSE, CAPILLARY
GLUCOSE-CAPILLARY: 239 mg/dL — AB (ref 65–99)
GLUCOSE-CAPILLARY: 100 mg/dL — AB (ref 65–99)
GLUCOSE-CAPILLARY: 106 mg/dL — AB (ref 65–99)
Glucose-Capillary: 239 mg/dL — ABNORMAL HIGH (ref 65–99)

## 2016-07-19 MED ORDER — QUETIAPINE FUMARATE 200 MG PO TABS
ORAL_TABLET | ORAL | Status: AC
  Administered 2016-07-23: 200 mg via ORAL
  Filled 2016-07-19: qty 1

## 2016-07-19 MED ORDER — METOPROLOL TARTRATE 50 MG PO TABS
ORAL_TABLET | ORAL | Status: AC
  Filled 2016-07-19: qty 1

## 2016-07-19 NOTE — ED Provider Notes (Signed)
-----------------------------------------   7:23 AM on 07/19/2016 -----------------------------------------   Blood pressure (!) 163/83, pulse (!) 102, temperature 97.7 F (36.5 C), temperature source Oral, resp. rate 18, height 5\' 2"  (1.575 m), weight 168 lb 3.4 oz (76.3 kg), SpO2 100 %.  The patient had no acute events since last update.  Calm and cooperative at this time.  Social work continues to attempt to place the patient. This patient has been exceedingly difficult to place and facility given her dialysis requirement as well as requirement to have a sitter. They're currently looking in the [place] area for placement.     Harvest Dark, MD 07/19/16 [phone]

## 2016-07-19 NOTE — ED Notes (Signed)
vsigns gotten by Alissa S. edt

## 2016-07-19 NOTE — Progress Notes (Signed)
Inpatient Diabetes Program Recommendations  AACE/ADA: New Consensus Statement on Inpatient Glycemic Control (2015)  Target Ranges:  Prepandial:   less than 140 mg/dL      Peak postprandial:   less than 180 mg/dL (1-2 hours)      Critically ill patients:  140 - 180 mg/dL   Lab Results  Component Value Date   GLUCAP 239 (H) 07/19/2016   HGBA1C 5.7 10/07/2012    Results for KILYNN, FIUMARA (MRN [id number]) as of 07/19/2016 08:44  Ref. Range 07/17/2016 17:20 07/17/2016 21:35 07/18/2016 08:11 07/18/2016 11:00 07/18/2016 14:04 07/18/2016 17:11 07/18/2016 21:17 07/19/2016 08:18  Glucose-Capillary Latest Ref Range: 65 - 99 mg/dL 191 (H) 213 (H) 168 (H)  84 245 (H) 277 (H) 239 (H)    Diabetes history:Type 2 Outpatient Diabetes medications: Novolog 0-12 units tid Current orders for Inpatient glycemic control: Novolog 0-15 units tid, Novolog 0-5 units qhs  Inpatient Diabetes Program Recommendations: Consider starting Lantus 4 units qhs.  Gentry Fitz, RN, BA, MHA, CDE Diabetes Coordinator Inpatient Diabetes Program  [phone] (Team Pager) [phone] ([place]) 07/19/2016 8:45 AM

## 2016-07-19 NOTE — ED Notes (Signed)
Pt besdside cbg 106

## 2016-07-19 NOTE — ED Notes (Signed)
Blood sugar was 239 notified nurse Tina C.

## 2016-07-20 LAB — RENAL FUNCTION PANEL
ALBUMIN: 3.6 g/dL (ref 3.5–5.0)
Anion gap: 11 (ref 5–15)
BUN: 62 mg/dL — AB (ref 6–20)
CO2: 29 mmol/L (ref 22–32)
CREATININE: 8.32 mg/dL — AB (ref 0.44–1.00)
Calcium: 9.5 mg/dL (ref 8.9–10.3)
Chloride: 93 mmol/L — ABNORMAL LOW (ref 101–111)
GFR calc Af Amer: 6 mL/min — ABNORMAL LOW (ref >60)
GFR, EST NON AFRICAN AMERICAN: 6 mL/min — AB (ref >60)
Glucose, Bld: 213 mg/dL — ABNORMAL HIGH (ref 65–99)
PHOSPHORUS: 4.2 mg/dL (ref 2.5–4.6)
Potassium: 6.2 mmol/L — ABNORMAL HIGH (ref 3.5–5.1)
Sodium: 133 mmol/L — ABNORMAL LOW (ref 135–145)

## 2016-07-20 LAB — GLUCOSE, CAPILLARY
GLUCOSE-CAPILLARY: 216 mg/dL — AB (ref 65–99)
Glucose-Capillary: 79 mg/dL (ref 65–99)
Glucose-Capillary: 209 mg/dL — ABNORMAL HIGH (ref 65–99)
Glucose-Capillary: 192 mg/dL — ABNORMAL HIGH (ref 65–99)

## 2016-07-20 LAB — CBC
HCT: 28.5 % — ABNORMAL LOW (ref 35.0–47.0)
Hemoglobin: 9.7 g/dL — ABNORMAL LOW (ref 12.0–16.0)
MCH: 33.1 pg (ref 26.0–34.0)
MCHC: 34.1 g/dL (ref 32.0–36.0)
MCV: 97.1 fL (ref 80.0–100.0)
Platelets: 184 10*3/uL (ref 150–440)
RBC: 2.94 MIL/uL — ABNORMAL LOW (ref 3.80–5.20)
RDW: 14.9 % — ABNORMAL HIGH (ref 11.5–14.5)
WBC: 3.8 10*3/uL (ref 3.6–11.0)

## 2016-07-20 NOTE — ED Notes (Signed)
VOL/Pending placement 

## 2016-07-20 NOTE — Progress Notes (Signed)
  Subjective:   Seen and examined on hemodialysis. Tolerating treatment well. Blood pressure at goal. UF goal of 2 litres.   Refusing to wear telemetry leads.   Objective:  Vital signs in last 24 hours:  Temp:  [98 F (36.7 C)-98.5 F (36.9 C)] 98.5 F (36.9 C) (08/25 1045) Pulse Rate:  [75-85] 78 (08/25 1100) Resp:  [18-20] 18 (08/25 1100) BP: (113-179)/(68-93) 113/70 (08/25 1100) SpO2:  [99 %-100 %] 99 % (08/25 1035)  Weight change:  Filed Weights   07/16/16 1346 07/18/16 1040 07/18/16 1345  Weight: 75.3 kg (166 lb 0.1 oz) 78.5 kg (173 lb 1 oz) 76.3 kg (168 lb 3.4 oz)    Intake/Output:   No intake or output data in the 24 hours ending 07/20/16 1104   Physical Exam: General: No acute distress  HEENT [place]/AT  Neck Supple   Pulm/lungs Normal breathing effort, clear to auscultation   CVS/Heart Regular rhythm, no rub or gallop   Abdomen:  Soft, nontender, nondistended   Extremities: trace peripheral edema   Neurologic: Alert, oriented, x 3 follows commands   Skin: No acute rashes   Access: AV fistula        Basic Metabolic Panel:   Recent Labs Lab 07/14/16 1009 07/18/16 1100  NA 138 134*  K 4.9 6.3*  CL 98* 94*  CO2 32 26  GLUCOSE 212* 257*  BUN 28* 64*  CREATININE 5.76* 7.74*  CALCIUM 9.3 9.5  PHOS 3.6 3.2     CBC:  Recent Labs Lab 07/14/16 1009 07/18/16 1100  WBC 3.7 3.8  HGB 8.6* 9.5*  HCT 25.7* 28.8*  MCV 100.0 99.4  PLT 176 173      Microbiology:  No results found for this or any previous visit (from the past 720 hour(s)).  Coagulation Studies: No results for input(s): LABPROT, INR in the last 72 hours.  Urinalysis: No results for input(s): COLORURINE, LABSPEC, PHURINE, GLUCOSEU, HGBUR, BILIRUBINUR, KETONESUR, PROTEINUR, UROBILINOGEN, NITRITE, LEUKOCYTESUR in the last 72 hours.  Invalid input(s): APPERANCEUR    Imaging: No results found.   Medications:     . acetaminophen  650 mg Oral Once  . amLODipine  10 mg Oral Daily   . aspirin  81 mg Oral Daily  . cholecalciferol  800 Units Oral Daily  . cloNIDine  0.3 mg Oral BID  . divalproex  1,000 mg Oral QHS  . epoetin (EPOGEN/PROCRIT) injection  10,000 Units Intravenous Q M,W,F-HD  . escitalopram  15 mg Oral Daily  . hydrALAZINE  100 mg Oral TID  . insulin aspart  0-15 Units Subcutaneous TID WC  . insulin aspart  0-5 Units Subcutaneous QHS  . irbesartan  150 mg Oral QHS  . metoprolol tartrate  50 mg Oral BID  . QUEtiapine  200 mg Oral BID  . traZODone  100 mg Oral QHS   acetaminophen  Assessment/ Plan:  35 y.o. African-American female with developmental delay, ESRD, AOCD, SHPTH, HTN, schizophrenia, mild cognitive impairment, diabetes mellitus type II  [facility] M[facility]  1. ESRD on HD MWF with hyperkalemia: Seen and examined on hemodialysis. Tolerating treatment well.  - Continue MWF schedule - 2 K bath  2. Anemia of CKD:  -epo with treatment  3. SHPTH: not currently on binders. Phos at goal.   4. Hypertension: well controlled.  - irbesartan, metoprolol, hydralazine, clonidine and amlodipine.    LOS: 1 Kelly [name] 8/25/201711:04 AM

## 2016-07-20 NOTE — ED Notes (Signed)
Blood sugar 79 ,notified nurse Bill S.

## 2016-07-20 NOTE — ED Notes (Signed)
Blood sugar was 216

## 2016-07-20 NOTE — ED Provider Notes (Signed)
-----------------------------------------   7:28 AM on 07/20/2016 -----------------------------------------   Blood pressure (!) 179/93, pulse 80, temperature 98.1 F (36.7 C), temperature source Oral, resp. rate 18, height 5\' 2"  (1.575 m), weight 168 lb 3.4 oz (76.3 kg), SpO2 99 %.  The patient had no acute events since last update.  Calm and cooperative at this time.  Disposition is pending per CSW recommendations. Patient should be getting dialysis today.     Paulette Blanch, MD 07/20/16 [phone]

## 2016-07-20 NOTE — Progress Notes (Signed)
Post dialysis assessment 

## 2016-07-20 NOTE — Progress Notes (Signed)
Dialysis completed

## 2016-07-20 NOTE — ED Notes (Signed)
Breakfast given to patient.

## 2016-07-20 NOTE — Progress Notes (Signed)
Dialysis started 

## 2016-07-20 NOTE — Clinical Social Work Note (Signed)
CSW has contacted Dennie Bible, Admissions Liason for [facility] [place], twice today and both times had to leave messages for her regarding where patient's referral stands in regards to the [place] area. Director of [place] working on discussing case further with either Theme park manager of patient's LME: [facility].  Shela Leff MSW,LCSW [phone]

## 2016-07-21 LAB — GLUCOSE, CAPILLARY
GLUCOSE-CAPILLARY: 126 mg/dL — AB (ref 65–99)
Glucose-Capillary: 158 mg/dL — ABNORMAL HIGH (ref 65–99)
Glucose-Capillary: 181 mg/dL — ABNORMAL HIGH (ref 65–99)
Glucose-Capillary: 270 mg/dL — ABNORMAL HIGH (ref 65–99)

## 2016-07-21 NOTE — ED Notes (Signed)
Pt resting in bed with eyes closed, lights off, and TV on. Respirations even and unlabored at this time. Will continue to monitor for further patient needs.

## 2016-07-21 NOTE — ED Notes (Signed)
Pt requesting milk, given milk by this RN. Denies further needs. Will continue to monitor for further patient needs.

## 2016-07-21 NOTE — ED Notes (Signed)
NAD noted at this time. Pt walking back and forth between bed and door. Will continue to monitor for further patient needs.

## 2016-07-21 NOTE — ED Notes (Signed)
Pt requests 2 warm blankets at this time. NAD noted. Requests this RN spread blankets out on her bed.

## 2016-07-21 NOTE — ED Notes (Signed)
Pt seen currently lying in bed asleep. Nothing needed of staff at this time. Pt environment safe. Pt safety rails up

## 2016-07-21 NOTE — ED Notes (Signed)
NAD noted at this time. Pt requesting another blanket, this RN explained to patient that she already had several blankets. Pt clucked her tongue, rolled her eyes and walked away. Will continue to monitor for further patient needs.

## 2016-07-21 NOTE — ED Notes (Signed)
EMD clean-up att

## 2016-07-21 NOTE — ED Notes (Signed)
Pt resting in bed at this time. NAD noted. Respirations even and unlabored. Will continue to monitor for further patient needs.

## 2016-07-21 NOTE — ED Notes (Signed)
Pt seen currently watching tv. Pt given something to drink per request. Pt was made aware that she will get a snack around 10pm. Nothing needed from staff at this time

## 2016-07-21 NOTE — ED Notes (Signed)
NAD noted at this time. Pt back and forth from bed to doorway. Denies any needs at this time. Pt calm and cooperative. Will continue to monitor for further patient needs.

## 2016-07-21 NOTE — ED Notes (Signed)
Pt refused a night time meal tray stating "I don't eat [place] sandwich and graham crackers... I want some chips and pretzels"/ Pt informed that based on her diet restrictions the choices we provided were all we had. Pt was given 2 apples for nourishment since she verbalized she had not eaten all day. After a hr, pt stated to [employer], "Im hungry Ill eat a [place] sandwich if its warm". Pt was given a warm sandwith. Pt currently eating and feeding herself.

## 2016-07-21 NOTE — ED Notes (Signed)
VOL/Pending placement 

## 2016-07-21 NOTE — ED Notes (Signed)
Pt given breakfast tray at this time. NAD noted.

## 2016-07-21 NOTE — ED Notes (Signed)
Pt in bed, lights off, TV on. NAD noted at this time. Pt noted to be calling out staff and patient as they pass her room. Pt denies any needs at this time. Will continue to monitor for further patient needs.

## 2016-07-21 NOTE — ED Notes (Signed)
Pt currently lying in bed asleep. Nothing is needed of staff at this time. Pt environment is safe

## 2016-07-21 NOTE — ED Notes (Signed)
Pt standing in doorway at this time. Requesting this RN to cover her up with a blanket, this RN explained to patient that she was capable of covering herself up. Pt then got angry with this RN when this RN instructed her to go back to her room and would not allow another RN to come and cover her up, pt states "You shut up!" Pt then returned to her bed, laid down, and covered herself up with the blankets.

## 2016-07-21 NOTE — ED Provider Notes (Signed)
-----------------------------------------   6:50 AM on 07/21/2016 -----------------------------------------   Blood pressure (!) 157/71, pulse 78, temperature 98.3 F (36.8 C), temperature source Oral, resp. rate 16, height 5\' 2"  (1.575 m), weight 77.8 kg, SpO2 100 %.  The patient had no acute events since last update.  Calm and cooperative at this time.  Disposition is pending CSW recommendations.     Hinda Kehr, MD 07/21/16 [phone]

## 2016-07-21 NOTE — ED Notes (Addendum)
Pt resting in bed. Pt took medications without difficulty. Pt requesting dinner tray, explained to patient that it was not time for dinner. Pt states understanding. Will continue to monitor for further needs.

## 2016-07-21 NOTE — ED Notes (Signed)
Report given to Michelle, RN

## 2016-07-21 NOTE — ED Notes (Signed)
EMR clean-up of previous CBG monitoring, only one performed this RN within shift was at 07-20-2016 at 2119

## 2016-07-22 LAB — BASIC METABOLIC PANEL
Anion gap: 10 (ref 5–15)
BUN: 59 mg/dL — AB (ref 6–20)
CALCIUM: 9.4 mg/dL (ref 8.9–10.3)
CO2: 31 mmol/L (ref 22–32)
CREATININE: 7.87 mg/dL — AB (ref 0.44–1.00)
Chloride: 97 mmol/L — ABNORMAL LOW (ref 101–111)
GFR, EST AFRICAN AMERICAN: 7 mL/min — AB (ref >60)
GFR, EST NON AFRICAN AMERICAN: 6 mL/min — AB (ref >60)
Glucose, Bld: 133 mg/dL — ABNORMAL HIGH (ref 65–99)
Potassium: 6.3 mmol/L (ref 3.5–5.1)
SODIUM: 138 mmol/L (ref 135–145)

## 2016-07-22 LAB — CBC WITH DIFFERENTIAL/PLATELET
BASOS ABS: 0.1 10*3/uL (ref 0–0.1)
BASOS PCT: 1 %
EOS ABS: 0.2 10*3/uL (ref 0–0.7)
EOS PCT: 4 %
HCT: 30.3 % — ABNORMAL LOW (ref 35.0–47.0)
Hemoglobin: 10.2 g/dL — ABNORMAL LOW (ref 12.0–16.0)
LYMPHS ABS: 2.6 10*3/uL (ref 1.0–3.6)
Lymphocytes Relative: 55 %
MCH: 33.4 pg (ref 26.0–34.0)
MCHC: 33.6 g/dL (ref 32.0–36.0)
MCV: 99.4 fL (ref 80.0–100.0)
Monocytes Absolute: 0.3 10*3/uL (ref 0.2–0.9)
Monocytes Relative: 6 %
Neutro Abs: 1.6 10*3/uL (ref 1.4–6.5)
Neutrophils Relative %: 34 %
PLATELETS: 184 10*3/uL (ref 150–440)
RBC: 3.05 MIL/uL — AB (ref 3.80–5.20)
RDW: 14.9 % — ABNORMAL HIGH (ref 11.5–14.5)
WBC: 4.7 10*3/uL (ref 3.6–11.0)

## 2016-07-22 LAB — GLUCOSE, CAPILLARY
Glucose-Capillary: 190 mg/dL — ABNORMAL HIGH (ref 65–99)
Glucose-Capillary: 232 mg/dL — ABNORMAL HIGH (ref 65–99)

## 2016-07-22 MED ORDER — ALBUTEROL SULFATE (2.5 MG/3ML) 0.083% IN NEBU
2.5000 mg | INHALATION_SOLUTION | Freq: Once | RESPIRATORY_TRACT | Status: AC
  Administered 2016-07-22: 2.5 mg via RESPIRATORY_TRACT

## 2016-07-22 MED ORDER — ALBUTEROL SULFATE (2.5 MG/3ML) 0.083% IN NEBU
INHALATION_SOLUTION | RESPIRATORY_TRACT | Status: AC
  Administered 2016-07-22: 2.5 mg via RESPIRATORY_TRACT
  Filled 2016-07-22: qty 3

## 2016-07-22 MED ORDER — IPRATROPIUM-ALBUTEROL 0.5-2.5 (3) MG/3ML IN SOLN: 3.0000 mL | Freq: Once | RESPIRATORY_TRACT | Status: DC

## 2016-07-22 NOTE — ED Notes (Signed)
Patient with swelling to right eye. Dr. Joni Fears notified. New orders received for BMP and CBC.

## 2016-07-22 NOTE — ED Notes (Signed)
Patient's face appears swollen. MD notified.  MD states, expected due to need for dialysis.  Patient to be transferred to dialysis unit shortly.  Patient is alert and oriented at this time.  No obvious distress.  Speaking clearly with no difficulty.  Respirations are even and not labored.

## 2016-07-22 NOTE — ED Provider Notes (Addendum)
-----------------------------------------   7:53 AM on 07/22/2016 -----------------------------------------   Blood pressure (!) 158/78, pulse 78, temperature 98.2 F (36.8 C), temperature source Oral, resp. rate 16, height 5\' 2"  (1.575 m), weight 171 lb 9.6 oz (77.8 kg), SpO2 100 %.  The patient had no acute events since last update.  Calm and cooperative at this time.  Disposition is pending Psychiatry/Behavioral Medicine team recommendations.  Patient with potassium of 6 will check EKG and arrange for dialysis.   Nena Polio, MD 07/22/16 [phone] EKG shows some peaking of the T waves dialysis will be done today that should help with her swelling face her potassium and her   Nena Polio, MD 07/22/16 [phone]

## 2016-07-22 NOTE — Progress Notes (Signed)
HD STARTED  

## 2016-07-22 NOTE — ED Notes (Signed)
Dr. Joni Fears and Dr. Cinda Quest notified of critical lab result at this time.  Kelly [name] has potassium of 6.3.

## 2016-07-22 NOTE — Progress Notes (Signed)
LCSW met with patient and as per ED nurse accompanied her to dialysis unit ( NO female Techs to transport to dialysis clinic) . Patient stopped me to obtain coloring books and crayons. ED nurse and LCSW explained to patient she could have them after her treatment. Patient was cooperative and calm. No further needs    LCSW [phone] 

## 2016-07-22 NOTE — Progress Notes (Signed)
CSW Director has been attempting to assist CSW staff with efforts to find a dialysis center near the group home in [place], [place] [facility]) willing to accept for this patient.    On Friday, 07/20/16 I attempted to reach Ninfa Meeker, [facility] Coordinator supervisor.  When swhe was out of the office I was able to speak at length to [place] for [facility] [phone]).  Ms. Rosita Fire was able to verify that patient was referred to [facility] on 8/3 (case was assigned to Michael Litter), an agency she feels can play a key role in helping resolve the barrier of the need for an outpatient dialysis center.  Ms. Rosita Fire has messaged Ms. Westley Hummer and will follow up with her on Monday, 8/28.  Ms. Westley Hummer also agreed to consult with her agency's medical director for assistance, and recommended that an application to [facility] be initiated for this patient.  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 Rosita Fire was discharged because of "dialysis issues.")  On Saturday evening, 8/26, my call to Fredirick Maudlin, Chief of Clinical Operations for [facility], was returned [phone]).  Ms. Glennon Mac was not aware of this patient but when I filled her in she agreed that her agency needed to assist with arrangements for placement as soon as possible, as the ED is notthte appropriate place for her to be.  Ms. Glennon Mac felt that engaging [facility] was a good option.  She also indicated she would engage the state Division of [place] Director, Dr. Cristopher Estimable, as soon as possible, first thing Monday, 8/28 if she was unable to reach her before then.    Will update CSW and ED staff as soon as I hear more from Ms. Rosita Fire and Ms. Glennon Mac.  [place], Microbiologist, Clinical Social Work [phone]

## 2016-07-22 NOTE — ED Notes (Addendum)
Pt refusing blood sugar check. Pt in the hall yelling, (not clear as to why she is so angry). Security escorted her into the room and multiple security showed up d/t the yelling

## 2016-07-22 NOTE — ED Notes (Signed)
Pt to dialysis with chart per order.

## 2016-07-22 NOTE — ED Notes (Signed)
Blood sugar check 190. Pt given food tray but not eating.

## 2016-07-22 NOTE — ED Notes (Signed)
BEHAVIORAL HEALTH ROUNDING Patient sleeping: No. Patient alert and oriented: yes Behavior appropriate: Yes.  ; If no, describe:  Nutrition and fluids offered: Yes  Toileting and hygiene offered: Yes  Sitter present: q 15 minute checks Law enforcement present: Yes

## 2016-07-22 NOTE — Progress Notes (Signed)
Subjective:   Seen and examined on hemodialysis. Tolerating treatment well. Blood pressure at goal. UF goal of 2 litres.   2K bath  Objective:  Vital signs in last 24 hours:  Temp:  [98.2 F (36.8 C)-98.9 F (37.2 C)] 98.9 F (37.2 C) (08/27 1700) Pulse Rate:  [78-87] 80 (08/27 1708) Resp:  [16-19] 18 (08/27 1708) BP: (119-168)/(65-93) 127/65 (08/27 1708) SpO2:  [99 %-100 %] 100 % (08/27 1400) Weight:  [80 kg (176 lb 4.8 oz)] 80 kg (176 lb 4.8 oz) (08/27 1355)  Weight change:  Filed Weights   07/18/16 1345 07/20/16 1345 07/22/16 1355  Weight: 76.3 kg (168 lb 3.4 oz) 77.8 kg (171 lb 9.6 oz) 80 kg (176 lb 4.8 oz)    Intake/Output:    Intake/Output Summary (Last 24 hours) at 07/22/16 2042 Last data filed at 07/22/16 1700  Gross per 24 hour  Intake                0 ml  Output             2000 ml  Net            -2000 ml     Physical Exam: General: No acute distress  HEENT [place]/AT  Neck Supple   Pulm/lungs Normal breathing effort, clear to auscultation   CVS/Heart Regular rhythm, no rub or gallop   Abdomen:  Soft, nontender, nondistended   Extremities: trace peripheral edema   Neurologic: Alert, oriented, x 3 follows commands   Skin: No acute rashes   Access: AV fistula        Basic Metabolic Panel:   Recent Labs Lab 07/18/16 1100 07/20/16 1045 07/22/16 0647  NA 134* 133* 138  K 6.3* 6.2* 6.3*  CL 94* 93* 97*  CO2 26 29 31   GLUCOSE 257* 213* 133*  BUN 64* 62* 59*  CREATININE 7.74* 8.32* 7.87*  CALCIUM 9.5 9.5 9.4  PHOS 3.2 4.2  --      CBC:  Recent Labs Lab 07/18/16 1100 07/20/16 1045 07/22/16 0647  WBC 3.8 3.8 4.7  NEUTROABS  --   --  1.6  HGB 9.5* 9.7* 10.2*  HCT 28.8* 28.5* 30.3*  MCV 99.4 97.1 99.4  PLT 173 184 184      Microbiology:  No results found for this or any previous visit (from the past 720 hour(s)).  Coagulation Studies: No results for input(s): LABPROT, INR in the last 72 hours.  Urinalysis: No results for  input(s): COLORURINE, LABSPEC, PHURINE, GLUCOSEU, HGBUR, BILIRUBINUR, KETONESUR, PROTEINUR, UROBILINOGEN, NITRITE, LEUKOCYTESUR in the last 72 hours.  Invalid input(s): APPERANCEUR    Imaging: No results found.   Medications:     . acetaminophen  650 mg Oral Once  . amLODipine  10 mg Oral Daily  . aspirin  81 mg Oral Daily  . cholecalciferol  800 Units Oral Daily  . cloNIDine  0.3 mg Oral BID  . divalproex  1,000 mg Oral QHS  . epoetin (EPOGEN/PROCRIT) injection  10,000 Units Intravenous Q M,W,F-HD  . escitalopram  15 mg Oral Daily  . hydrALAZINE  100 mg Oral TID  . insulin aspart  0-15 Units Subcutaneous TID WC  . insulin aspart  0-5 Units Subcutaneous QHS  . irbesartan  150 mg Oral QHS  . metoprolol tartrate  50 mg Oral BID  . QUEtiapine  200 mg Oral BID  . traZODone  100 mg Oral QHS   acetaminophen  Assessment/ Plan:  35 y.o. African-American female with  developmental delay, ESRD, AOCD, SHPTH, HTN, schizophrenia, mild cognitive impairment, diabetes mellitus type II  [facility] MWF [facility] [address]  1. ESRD on HD MWF with hyperkalemia: Seen and examined on hemodialysis. Extra treatment today for hyperkalemia. Tolerating treatment well.  2 K bath  2. Anemia of CKD:  -epo with treatment  3. SHPTH: not currently on binders. Phos at goal.   4. Hypertension: well controlled.  - irbesartan, metoprolol, hydralazine, clonidine and amlodipine.    LOS: [place], Antonie Borjon 8/27/20178:42 PM

## 2016-07-22 NOTE — Progress Notes (Signed)
HD COMPLETED  

## 2016-07-22 NOTE — Progress Notes (Signed)
POST DIALYSIS ASSESSMENT 

## 2016-07-22 NOTE — ED Notes (Signed)
Pt back from dialysis and given her dinner tray

## 2016-07-22 NOTE — ED Notes (Signed)
Patient ate cereal for breakfast.

## 2016-07-22 NOTE — Progress Notes (Signed)
PRE DIALYSIS ASSESSMENT 

## 2016-07-22 NOTE — ED Notes (Signed)
BEHAVIORAL HEALTH ROUNDING Patient sleeping: No. Patient alert and oriented: yes Behavior appropriate: Yes.  ; If no, describe:  Nutrition and fluids offered: Yes  Toileting and hygiene offered: Yes  Sitter present: q 15 min checks Law enforcement present: Yes  

## 2016-07-23 LAB — GLUCOSE, CAPILLARY
GLUCOSE-CAPILLARY: 146 mg/dL — AB (ref 65–99)
GLUCOSE-CAPILLARY: 162 mg/dL — AB (ref 65–99)
GLUCOSE-CAPILLARY: 162 mg/dL — AB (ref 65–99)
GLUCOSE-CAPILLARY: 204 mg/dL — AB (ref 65–99)
Glucose-Capillary: 196 mg/dL — ABNORMAL HIGH (ref 65–99)

## 2016-07-23 MED ORDER — NEPRO/CARBSTEADY PO LIQD
237.0000 mL | Freq: Two times a day (BID) | ORAL | Status: DC
  Administered 2016-07-24 – 2016-08-02 (×17): 237 mL via ORAL

## 2016-07-23 NOTE — ED Notes (Signed)
BEHAVIORAL HEALTH ROUNDING Patient sleeping: Yes.   Patient alert and oriented: not applicable SLEEPING Behavior appropriate: Yes.  ; If no, describe: SLEEPING Nutrition and fluids offered: No SLEEPING Toileting and hygiene offered: NoSLEEPING Sitter present: not applicable, Q 15 min safety rounds and observation. Law enforcement present: Yes ODS 

## 2016-07-23 NOTE — Progress Notes (Signed)
Pre hd assessment  

## 2016-07-23 NOTE — ED Provider Notes (Signed)
-----------------------------------------   7:08 AM on 07/23/2016 -----------------------------------------   Blood pressure (!) 165/65, pulse 85, temperature 98.9 F (37.2 C), temperature source Oral, resp. rate 16, height 5\' 2"  (1.575 m), weight 80 kg, SpO2 100 %.  The patient had no acute events since last update.  Calm and cooperative at this time.  Disposition is pending CSW recommendations.      Hinda Kehr, MD 07/23/16 [phone]

## 2016-07-23 NOTE — ED Notes (Addendum)
Pt refused vital signs.  Will try again at end of shift or if she wakes up

## 2016-07-23 NOTE — ED Notes (Signed)

## 2016-07-23 NOTE — ED Notes (Signed)
Pt given dinner tray.

## 2016-07-23 NOTE — Progress Notes (Signed)
Pt states she "wishes she were dead." She stated she tried to slit her wrists before. When I asked her if she would do it again she replied, "I don't know. Floor RN and MD aware.

## 2016-07-23 NOTE — Progress Notes (Signed)
Pre hd info 

## 2016-07-23 NOTE — ED Notes (Signed)
Pt to dialysis. Report given to [facility].

## 2016-07-23 NOTE — Progress Notes (Signed)
END OF HD TX

## 2016-07-23 NOTE — Progress Notes (Signed)
HD MID TX RE ASSESSMENT

## 2016-07-23 NOTE — Progress Notes (Signed)
Subjective:   Seen and examined on hemodialysis. Tolerating treatment well. Moving her arm despite education Venous pressure in access have increased UF goal of 2 litres.    HEMODIALYSIS FLOWSHEET:  Blood Flow Rate (mL/min): 400 mL/min Arterial Pressure (mmHg): -180 mmHg Venous Pressure (mmHg): 300 mmHg Transmembrane Pressure (mmHg): 60 mmHg Ultrafiltration Rate (mL/min): 720 mL/min Dialysate Flow Rate (mL/min): 800 ml/min Conductivity: Machine : 14.2 Conductivity: Machine : 14.2 Dialysis Fluid Bolus: Normal Saline Bolus Amount (mL): 100 mL (rinse) Dialysate Change: 2K Intra-Hemodialysis Comments: 1290. pt alert, no c/o, still moving access arm       Objective:  Vital signs in last 24 hours:  Temp:  [98.3 F (36.8 C)-98.9 F (37.2 C)] 98.3 F (36.8 C) (08/28 0945) Pulse Rate:  [78-97] 97 (08/28 1130) Resp:  [15-20] 19 (08/28 1130) BP: (119-170)/(65-81) 144/67 (08/28 1130) SpO2:  [100 %] 100 % (08/28 1130) Weight:  [80 kg (176 lb 4.8 oz)-80 kg (176 lb 5.9 oz)] 80 kg (176 lb 5.9 oz) (08/28 0945)  Weight change:  Filed Weights   07/20/16 1345 07/22/16 1355 07/23/16 0945  Weight: 77.8 kg (171 lb 9.6 oz) 80 kg (176 lb 4.8 oz) 80 kg (176 lb 5.9 oz)    Intake/Output:    Intake/Output Summary (Last 24 hours) at 07/23/16 1144 Last data filed at 07/22/16 1700  Gross per 24 hour  Intake                0 ml  Output             2000 ml  Net            -2000 ml     Physical Exam: General: No acute distress  HEENT [place]/AT  Neck Supple   Pulm/lungs Normal breathing effort, clear to auscultation   CVS/Heart Regular rhythm, no rub or gallop   Abdomen:  Soft, nontender, nondistended   Extremities: trace peripheral edema   Neurologic: Alert, oriented, x 3 follows commands   Skin: No acute rashes   Access: AV fistula        Basic Metabolic Panel:   Recent Labs Lab 07/18/16 1100 07/20/16 1045 07/22/16 0647  NA 134* 133* 138  K 6.3* 6.2* 6.3*  CL 94* 93* 97*   CO2 26 29 31   GLUCOSE 257* 213* 133*  BUN 64* 62* 59*  CREATININE 7.74* 8.32* 7.87*  CALCIUM 9.5 9.5 9.4  PHOS 3.2 4.2  --      CBC:  Recent Labs Lab 07/18/16 1100 07/20/16 1045 07/22/16 0647  WBC 3.8 3.8 4.7  NEUTROABS  --   --  1.6  HGB 9.5* 9.7* 10.2*  HCT 28.8* 28.5* 30.3*  MCV 99.4 97.1 99.4  PLT 173 184 184      Microbiology:  No results found for this or any previous visit (from the past 720 hour(s)).  Coagulation Studies: No results for input(s): LABPROT, INR in the last 72 hours.  Urinalysis: No results for input(s): COLORURINE, LABSPEC, PHURINE, GLUCOSEU, HGBUR, BILIRUBINUR, KETONESUR, PROTEINUR, UROBILINOGEN, NITRITE, LEUKOCYTESUR in the last 72 hours.  Invalid input(s): APPERANCEUR    Imaging: No results found.   Medications:     . acetaminophen  650 mg Oral Once  . amLODipine  10 mg Oral Daily  . aspirin  81 mg Oral Daily  . cholecalciferol  800 Units Oral Daily  . cloNIDine  0.3 mg Oral BID  . divalproex  1,000 mg Oral QHS  . epoetin (EPOGEN/PROCRIT) injection  10,000 Units  Intravenous Q M,W,F-HD  . escitalopram  15 mg Oral Daily  . hydrALAZINE  100 mg Oral TID  . insulin aspart  0-15 Units Subcutaneous TID WC  . insulin aspart  0-5 Units Subcutaneous QHS  . irbesartan  150 mg Oral QHS  . metoprolol tartrate  50 mg Oral BID  . QUEtiapine  200 mg Oral BID  . traZODone  100 mg Oral QHS   acetaminophen  Assessment/ Plan:  35 y.o. African-American female with developmental delay, ESRD, AOCD, SHPTH, HTN, schizophrenia, mild cognitive impairment, diabetes mellitus type II  [facility] M[facility] [address] [address]  1. hyperkalemia with ESRD on HD MWF :  Seen and examined on hemodialysis. Extra treatment today for hyperkalemia and volume overload Tolerating treatment well.  2 K bath Venous pressure high to arm movement - avoid chips, french fries - add Nepro supplements  2. Anemia of CKD:  -epo with treatment  3. SHPTH: not currently  on binders. Phos at goal.   4. Hypertension: well controlled.  - irbesartan, metoprolol, hydralazine, clonidine and amlodipine.      LOS: 1 Kelly [name] 8/28/201711:44 AM

## 2016-07-23 NOTE — Progress Notes (Signed)
Start of hd 

## 2016-07-23 NOTE — Progress Notes (Signed)
Post hd vitals 

## 2016-07-23 NOTE — Progress Notes (Signed)
Pt refusing to keep access arm straight and still. Candiss Norse MD present and aware. VP high d/t movement of access arm.

## 2016-07-23 NOTE — ED Notes (Signed)
Pt back to room from dialysis.

## 2016-07-23 NOTE — Clinical Social Work Note (Signed)
CSW has touched base with CSW Director, [facility], and her note from Saturday has been reviewed. CSW will make a referral to [facility] which is a neuro pscyh institution. Patient's LME Chief of Clinical Operations [phone]) is going ot look into engaging [place] and the state Division of [place] Director: Dr. Cristopher Estimable as soon as possible. Shela Leff MSW,LCSW [phone]

## 2016-07-24 LAB — GLUCOSE, CAPILLARY
GLUCOSE-CAPILLARY: 169 mg/dL — AB (ref 65–99)
GLUCOSE-CAPILLARY: 133 mg/dL — AB (ref 65–99)
GLUCOSE-CAPILLARY: 205 mg/dL — AB (ref 65–99)
Glucose-Capillary: 243 mg/dL — ABNORMAL HIGH (ref 65–99)

## 2016-07-24 NOTE — ED Notes (Signed)
ED BHU [place] Is the patient under IVC or is there intent for IVC: Yes.   Is the patient medically cleared: Yes.   Is there vacancy in the ED BHU: Yes.   Is the population mix appropriate for patient: Yes.   Is the patient awaiting placement in inpatient or outpatient setting: Yes.   Has the patient had a psychiatric consult: Yes.   Survey of unit performed for contraband, proper placement and condition of furniture, tampering with fixtures in bathroom, shower, and each patient room: Yes.  ; Findings: Environment secure. APPEARANCE/BEHAVIOR calm, cooperative and adequate rapport can be established NEURO ASSESSMENT Orientation: place and person Hallucinations: No.None noted (Hallucinations) Speech: Normal Gait: unsteady RESPIRATORY ASSESSMENT Normal expansion.  Clear to auscultation.  No rales, rhonchi, or wheezing., No chest wall tenderness., No kyphosis or scoliosis. CARDIOVASCULAR ASSESSMENT regular rate and rhythm, S1, S2 normal, no murmur, click, rub or gallop GASTROINTESTINAL ASSESSMENT soft, nontender, BS WNL, no r/g EXTREMITIES normal strength, tone, and muscle mass, no deformities, no erythema, induration, or nodules, no evidence of joint effusion, ROM of all joints is normal, no evidence of joint instability PLAN OF CARE Provide calm/safe environment. Vital signs assessed twice daily. ED BHU Assessment once each 12-hour shift. Collaborate with intake RN daily or as condition indicates. Assure the ED provider has rounded once each shift. Provide and encourage hygiene. Provide redirection as needed. Assess for escalating behavior; address immediately and inform ED provider.  Assess family dynamic and appropriateness for visitation as needed: No.; If necessary, describe findings: Family dynamics were not assessed since they are not present.  Educate the patient/family about BHU procedures/visitation: Yes.  ; If necessary, describe findings: Pt understands the rules of  the BHU/QUAD.

## 2016-07-24 NOTE — ED Provider Notes (Signed)
-----------------------------------------   6:33 AM on 07/24/2016 -----------------------------------------   Blood pressure (!) 158/75, pulse 86, temperature 98.4 F (36.9 C), resp. rate 18, height 5\' 2"  (1.575 m), weight 170 lb 13.7 oz (77.5 kg), SpO2 99 %.  The patient had no acute events since last update.  Calm and cooperative at this time.  Disposition is pending clinical social work team recommendations.     Paulette Blanch, MD 07/24/16 [phone]

## 2016-07-24 NOTE — ED Notes (Signed)
Patient assigned to appropriate care area. Patient oriented to unit/care area: Informed that, for their safety, care areas are designed for safety and monitored by security cameras at all times; and visiting hours explained to patient. Patient verbalizes understanding, and verbal contract for safety obtained. 

## 2016-07-25 LAB — CBC
HEMATOCRIT: 25.9 % — AB (ref 35.0–47.0)
Hemoglobin: 8.9 g/dL — ABNORMAL LOW (ref 12.0–16.0)
MCH: 33.5 pg (ref 26.0–34.0)
MCHC: 34.5 g/dL (ref 32.0–36.0)
MCV: 97.1 fL (ref 80.0–100.0)
PLATELETS: 121 10*3/uL — AB (ref 150–440)
RBC: 2.67 MIL/uL — ABNORMAL LOW (ref 3.80–5.20)
RDW: 14.3 % (ref 11.5–14.5)
WBC: 3.5 10*3/uL — ABNORMAL LOW (ref 3.6–11.0)

## 2016-07-25 LAB — RENAL FUNCTION PANEL
Albumin: 3.2 g/dL — ABNORMAL LOW (ref 3.5–5.0)
Anion gap: 11 (ref 5–15)
BUN: 54 mg/dL — AB (ref 6–20)
CHLORIDE: 95 mmol/L — AB (ref 101–111)
CO2: 30 mmol/L (ref 22–32)
CREATININE: 6.75 mg/dL — AB (ref 0.44–1.00)
Calcium: 9.2 mg/dL (ref 8.9–10.3)
GFR calc Af Amer: 8 mL/min — ABNORMAL LOW (ref >60)
GFR, EST NON AFRICAN AMERICAN: 7 mL/min — AB (ref >60)
GLUCOSE: 227 mg/dL — AB (ref 65–99)
Phosphorus: 3.9 mg/dL (ref 2.5–4.6)
Potassium: 5 mmol/L (ref 3.5–5.1)
Sodium: 136 mmol/L (ref 135–145)

## 2016-07-25 LAB — GLUCOSE, CAPILLARY
Glucose-Capillary: 148 mg/dL — ABNORMAL HIGH (ref 65–99)
Glucose-Capillary: 240 mg/dL — ABNORMAL HIGH (ref 65–99)

## 2016-07-25 MED ORDER — EPOETIN ALFA 4000 UNIT/ML IJ SOLN
4000.0000 [IU] | INTRAMUSCULAR | Status: DC
  Administered 2016-07-25 – 2016-08-03 (×5): 4000 [IU] via INTRAVENOUS
  Filled 2016-07-25: qty 1

## 2016-07-25 MED ORDER — HEPARIN SODIUM (PORCINE) 1000 UNIT/ML DIALYSIS
20.0000 [IU]/kg | INTRAMUSCULAR | Status: DC | PRN
  Filled 2016-07-25: qty 2

## 2016-07-25 MED ORDER — EPOETIN ALFA 4000 UNIT/ML IJ SOLN: 4000.0000 [IU] | INTRAMUSCULAR | Status: DC

## 2016-07-25 NOTE — ED Notes (Signed)
Pt at dialysis

## 2016-07-25 NOTE — Progress Notes (Signed)
Pre Dialysis 

## 2016-07-25 NOTE — Progress Notes (Signed)
CSW called [facility] at [phone] to start new patient admission process for pt. The dialysis start date has been set for 9/6. [facility] is requesting additional information about pt such as dialysis history and other clinical information. CSW informed intake worker to fax necessary paperwork to [phone]. CSW will then review paperwork with pt's nurse and contact [facility] (pt's previous dialysis center) to help fill out necessary information. Awaiting fax.  [name], MSW, [place]

## 2016-07-25 NOTE — Progress Notes (Signed)
Dialysis started 

## 2016-07-25 NOTE — Progress Notes (Signed)
Post Dialysis 

## 2016-07-25 NOTE — ED Notes (Signed)
Chaplin speaking with patient , pt calm and cooperative

## 2016-07-25 NOTE — Progress Notes (Signed)
Additional update from Clinical Social Work - Follow up call received from [place], Risk analyst of Clinical Operations for [facility] this morning.  Ms. Glennon Mac reported that she had reviewed patien with Dr. Cristopher Estimable at the Division of [place], and that Dr. Lamar Benes had no additional suggestions with regard to how to resolve the need for a community dialysis center except to continue to work through [facility] ([facility] [place]; patient falls into cachement area for [place]).  A short time later CSW Director received a call from [facility], who is assigned to patient from [place].  Ms. Westley Hummer reported taht she had begun the process of opening patient's case with [facility] in [place] and asked that hospital staff move the process forward as Matthew Saras, [place], [facility] ED CSW has explained in her note of earlier this evening.  In order to open the case, a dialysis start date of 9/6 was entered into the [facility] system.  However, as Ms. Marshell Levan has noted, clinical and behavioral health information must now be submitted for approval.    Additionally, as Shela Leff, LCSW, noted on 8/28, we have submitted an application to [facility] on behalf of patient at the recommendation of Ms. Glennon Mac with [facility] as well.  Daphene Calamity, ACSW, Investment banker, operational, Clinical Social Work [phone]

## 2016-07-25 NOTE — ED Notes (Signed)
Dialysis RN sts took off 2.5 L.

## 2016-07-25 NOTE — ED Notes (Signed)
EVS in for house keeping

## 2016-07-25 NOTE — Progress Notes (Signed)
Subjective:   Patient seen during dialysis Tolerating well  No c/o today Face appears less swolen  HEMODIALYSIS FLOWSHEET:  Blood Flow Rate (mL/min): 400 mL/min Arterial Pressure (mmHg): -130 mmHg Venous Pressure (mmHg): 210 mmHg Transmembrane Pressure (mmHg): 60 mmHg Ultrafiltration Rate (mL/min): 750 mL/min Dialysate Flow Rate (mL/min): 800 ml/min Conductivity: Machine : 13.7 Conductivity: Machine : 13.7 Dialysis Fluid Bolus: Normal Saline Bolus Amount (mL): 250 mL Dialysate Change: 2K Intra-Hemodialysis Comments: 594. Resting       Objective:  Vital signs in last 24 hours:  Temp:  [98.2 F (36.8 C)-98.8 F (37.1 C)] 98.2 F (36.8 C) (08/30 0930) Pulse Rate:  [81-94] 89 (08/30 1030) Resp:  [14-20] 18 (08/30 1030) BP: (156-191)/(67-100) 160/87 (08/30 1030) SpO2:  [96 %-100 %] 100 % (08/30 1030) Weight:  [80.1 kg (176 lb 9.4 oz)] 80.1 kg (176 lb 9.4 oz) (08/30 0930)  Weight change:  Filed Weights   07/23/16 0945 07/23/16 1318 07/25/16 0930  Weight: 80 kg (176 lb 5.9 oz) 77.5 kg (170 lb 13.7 oz) 80.1 kg (176 lb 9.4 oz)    Intake/Output:    Intake/Output Summary (Last 24 hours) at 07/25/16 1104 Last data filed at 07/24/16 2238  Gross per 24 hour  Intake              118 ml  Output                0 ml  Net              118 ml     Physical Exam: General: No acute distress  HEENT [place]/AT  Neck Supple   Pulm/lungs Normal breathing effort, clear to auscultation   CVS/Heart Regular rhythm, no rub or gallop, Systolic murmur present   Abdomen:  Soft, nontender, nondistended   Extremities: trace peripheral edema   Neurologic: Alert, oriented, x 3 follows commands   Skin: No acute rashes   Access: AV fistula        Basic Metabolic Panel:   Recent Labs Lab 07/20/16 1045 07/22/16 0647 07/25/16 0945  NA 133* 138 136  K 6.2* 6.3* 5.0  CL 93* 97* 95*  CO2 29 31 30   GLUCOSE 213* 133* 227*  BUN 62* 59* 54*  CREATININE 8.32* 7.87* 6.75*  CALCIUM 9.5  9.4 9.2  PHOS 4.2  --  3.9     CBC:  Recent Labs Lab 07/20/16 1045 07/22/16 0647 07/25/16 0945  WBC 3.8 4.7 3.5*  NEUTROABS  --  1.6  --   HGB 9.7* 10.2* 8.9*  HCT 28.5* 30.3* 25.9*  MCV 97.1 99.4 97.1  PLT 184 184 121*      Microbiology:  No results found for this or any previous visit (from the past 720 hour(s)).  Coagulation Studies: No results for input(s): LABPROT, INR in the last 72 hours.  Urinalysis: No results for input(s): COLORURINE, LABSPEC, PHURINE, GLUCOSEU, HGBUR, BILIRUBINUR, KETONESUR, PROTEINUR, UROBILINOGEN, NITRITE, LEUKOCYTESUR in the last 72 hours.  Invalid input(s): APPERANCEUR    Imaging: No results found.   Medications:     . acetaminophen  650 mg Oral Once  . amLODipine  10 mg Oral Daily  . aspirin  81 mg Oral Daily  . cholecalciferol  800 Units Oral Daily  . cloNIDine  0.3 mg Oral BID  . divalproex  1,000 mg Oral QHS  . epoetin (EPOGEN/PROCRIT) injection  4,000 Units Intravenous Q M,W,F-HD  . escitalopram  15 mg Oral Daily  . feeding supplement (NEPRO CARB STEADY)  237 mL Oral BID BM  . hydrALAZINE  100 mg Oral TID  . insulin aspart  0-15 Units Subcutaneous TID WC  . insulin aspart  0-5 Units Subcutaneous QHS  . irbesartan  150 mg Oral QHS  . metoprolol tartrate  50 mg Oral BID  . QUEtiapine  200 mg Oral BID  . traZODone  100 mg Oral QHS   acetaminophen, heparin  Assessment/ Plan:  35 y.o. African-American female with developmental delay, ESRD, AOCD, SHPTH, HTN, schizophrenia, mild cognitive impairment, diabetes mellitus type II  [facility] MWF [facility] [address]  1. hyperkalemia with ESRD on HD MWF :  Seen and examined on hemodialysis.   Potassium level improved today - avoid potato chips, french fries -  add Nepro supplements  2. Anemia of CKD:  -epo with dialysis treatment - dose adjusted down  3. SHPTH: not currently on binders. Phos at goal.   4. Hypertension: controlled.  - irbesartan, metoprolol,  hydralazine, clonidine and amlodipine.      LOS: 1 Kelly [name] 8/30/201711:04 AM

## 2016-07-25 NOTE — ED Provider Notes (Signed)
-----------------------------------------   7:02 AM on 07/25/2016 -----------------------------------------   Blood pressure (!) 156/85, pulse 81, temperature 98.4 F (36.9 C), temperature source Oral, resp. rate 20, height 5\' 2"  (1.575 m), weight 77.5 kg, SpO2 100 %.  The patient had no acute events since last update.  Calm and cooperative at this time.  Disposition is pending CSW recommendations.     Hinda Kehr, MD 07/25/16 [phone]

## 2016-07-26 ENCOUNTER — Emergency Department: Payer: MEDICAID

## 2016-07-26 LAB — GLUCOSE, CAPILLARY
GLUCOSE-CAPILLARY: 214 mg/dL — AB (ref 65–99)
GLUCOSE-CAPILLARY: 191 mg/dL — AB (ref 65–99)
GLUCOSE-CAPILLARY: 152 mg/dL — AB (ref 65–99)
Glucose-Capillary: 181 mg/dL — ABNORMAL HIGH (ref 65–99)

## 2016-07-26 NOTE — Progress Notes (Signed)
  Inpatient Diabetes Program Recommendations  AACE/ADA: New Consensus Statement on Inpatient Glycemic Control (2015)  Target Ranges:  Prepandial:   less than 140 mg/dL      Peak postprandial:   less than 180 mg/dL (1-2 hours)      Critically ill patients:  140 - 180 mg/dL   Lab Results  Component Value Date   GLUCAP 214 (H) 07/26/2016   HGBA1C 5.7 10/07/2012    Review of Glycemic Control  Results for Kelly [name], Kelly [name] (MRN [id number]) as of 07/26/2016 13:43  Ref. Range 07/24/2016 18:17 07/24/2016 22:50 07/25/2016 14:35 07/25/2016 21:02 07/26/2016 08:14  Glucose-Capillary Latest Ref Range: 65 - 99 mg/dL 243 (H) 205 (H) 148 (H) 240 (H) 214 (H)    Diabetes history:Type 2 Outpatient Diabetes medications: Novolog 0-12 units tid Current orders for Inpatient glycemic control: Novolog 0-15 units tid, Novolog 0-5 units qhs  Inpatient Diabetes Program Recommendations: Could consider low dose basal insulin Lantus 3 units qhs.  Gentry Fitz, RN, BA, MHA, CDE Diabetes Coordinator Inpatient Diabetes Program  [phone] (Team Pager) [phone] ([place]) 07/26/2016 1:42 PM

## 2016-07-26 NOTE — ED Notes (Signed)
Pt given pt breakfast. Pt given two warm blankets and is resting.

## 2016-07-26 NOTE — ED Notes (Signed)
Pt given cup of decaf coffee.

## 2016-07-26 NOTE — ED Notes (Signed)
Per doctor order tech Jeannene Patella) took pt out EMS bay doors to tables and sat outside approx. 17mins. Pt caused no problems and returned inside when ask.

## 2016-07-26 NOTE — Progress Notes (Addendum)
CSW received fax from [facility] this morning with forms for pt's dialysis admission checklist. CSW consulted with EDP about the forms requested on the checklist and obtained necessary forms from ED secretary, dialysis center at [facility], and pt's former dialysis center [facility]. This process took about two hours. CSW faxed all requested information at approximately 11:30am via fax number [phone]. Awaiting response.   CSW called Tiffany with [facility] [phone] to give her an update on pt's status. Tiffany states that she is ready to receive pt next week. CSW informed her that pt's dialysis is in the process of being transferred and the estimated start date will be 9/6. Jonelle Sidle is agreeable to this plan.  CSW will continue to follow pt and assist as needed.   [name], MSW, [place]

## 2016-07-26 NOTE — ED Provider Notes (Addendum)
-----------------------------------------   7:40 AM on 07/26/2016 -----------------------------------------   Blood pressure (!) 167/68, pulse 78, temperature 99 F (37.2 C), temperature source Oral, resp. rate 18, height 5\' 2"  (1.575 m), weight 169 lb 12.8 oz (77 kg), SpO2 97 %.  The patient had no acute events since last update.  Calm and cooperative at this time.  Disposition is pending Psychiatry/Behavioral Medicine team recommendations.  I will try to get the patient outside today. I have discussed with hospital administrator patient's prolonged stay in emergency room and apparently everything that can be done is being done.   Schuyler Amor, MD 07/26/16 [id number]    Schuyler Amor, MD 07/26/16 [phone]

## 2016-07-26 NOTE — Progress Notes (Signed)
CSW called [facility] [phone] to confirm that the paperwork CSW sent was received. Lovena Le, admissions worker, confirmed that the information was received. [facility] is currently still reviewing pt's case and CSW was instructed to call back tomorrow for an update. Weekend CSW notified.   [name], MSW, [place]

## 2016-07-27 LAB — RENAL FUNCTION PANEL
ALBUMIN: 3.6 g/dL (ref 3.5–5.0)
ANION GAP: 12 (ref 5–15)
BUN: 73 mg/dL — AB (ref 6–20)
CALCIUM: 9.5 mg/dL (ref 8.9–10.3)
CO2: 30 mmol/L (ref 22–32)
CREATININE: 7.96 mg/dL — AB (ref 0.44–1.00)
Chloride: 92 mmol/L — ABNORMAL LOW (ref 101–111)
GFR calc Af Amer: 7 mL/min — ABNORMAL LOW (ref >60)
GFR calc non Af Amer: 6 mL/min — ABNORMAL LOW (ref >60)
GLUCOSE: 240 mg/dL — AB (ref 65–99)
PHOSPHORUS: 4.6 mg/dL (ref 2.5–4.6)
Potassium: 4.2 mmol/L (ref 3.5–5.1)
SODIUM: 134 mmol/L — AB (ref 135–145)

## 2016-07-27 LAB — CBC
HCT: 29.1 % — ABNORMAL LOW (ref 35.0–47.0)
HEMOGLOBIN: 9.9 g/dL — AB (ref 12.0–16.0)
MCH: 33.1 pg (ref 26.0–34.0)
MCHC: 34 g/dL (ref 32.0–36.0)
MCV: 97.3 fL (ref 80.0–100.0)
PLATELETS: 157 10*3/uL (ref 150–440)
RBC: 2.99 MIL/uL — ABNORMAL LOW (ref 3.80–5.20)
RDW: 14.2 % (ref 11.5–14.5)
WBC: 4.2 10*3/uL (ref 3.6–11.0)

## 2016-07-27 NOTE — Progress Notes (Signed)
HD initiated without issue  

## 2016-07-27 NOTE — Progress Notes (Signed)
Post dialysis 

## 2016-07-27 NOTE — Progress Notes (Signed)
Subjective:   Patient seen during dialysis Tolerating well  No c/o today Happy with the banana bread and gum provided by nursing staff  HEMODIALYSIS FLOWSHEET:  Blood Flow Rate (mL/min): 400 mL/min Arterial Pressure (mmHg): -180 mmHg Venous Pressure (mmHg): 210 mmHg Transmembrane Pressure (mmHg): 50 mmHg Ultrafiltration Rate (mL/min): 750 mL/min Dialysate Flow Rate (mL/min): 800 ml/min Conductivity: Machine : 13.9 Conductivity: Machine : 13.9 Dialysis Fluid Bolus: Normal Saline Bolus Amount (mL): 250 mL Dialysate Change: 2K Intra-Hemodialysis Comments: 402. Resting       Objective:  Vital signs in last 24 hours:  Temp:  [97.8 F (36.6 C)-98.2 F (36.8 C)] 97.8 F (36.6 C) (09/01 1018) Pulse Rate:  [81-96] 90 (09/01 1100) Resp:  [15-21] 21 (09/01 1100) BP: (139-165)/(64-100) 160/78 (09/01 1100) SpO2:  [99 %-100 %] 100 % (09/01 1100) Weight:  [79.2 kg (174 lb 9.6 oz)] 79.2 kg (174 lb 9.6 oz) (09/01 1018)  Weight change:  Filed Weights   07/25/16 0930 07/25/16 1354 07/27/16 1018  Weight: 80.1 kg (176 lb 9.4 oz) 77 kg (169 lb 12.8 oz) 79.2 kg (174 lb 9.6 oz)    Intake/Output:   No intake or output data in the 24 hours ending 07/27/16 1127   Physical Exam: General: No acute distress, pleasant, sitting in dialysis chair  HEENT [place]/AT  Neck Supple   Pulm/lungs Normal breathing effort, clear to auscultation   CVS/Heart Regular rhythm, no rub or gallop, Systolic murmur present   Abdomen:  Soft, nontender, nondistended   Extremities: trace peripheral edema   Neurologic: Alert, oriented, x 3 follows commands   Skin: No acute rashes   Access: AV fistula        Basic Metabolic Panel:   Recent Labs Lab 07/22/16 0647 07/25/16 0945 07/27/16 1020  NA 138 136 134*  K 6.3* 5.0 4.2  CL 97* 95* 92*  CO2 31 30 30   GLUCOSE 133* 227* 240*  BUN 59* 54* 73*  CREATININE 7.87* 6.75* 7.96*  CALCIUM 9.4 9.2 9.5  PHOS  --  3.9 4.6     CBC:  Recent Labs Lab  07/22/16 0647 07/25/16 0945 07/27/16 1020  WBC 4.7 3.5* 4.2  NEUTROABS 1.6  --   --   HGB 10.2* 8.9* 9.9*  HCT 30.3* 25.9* 29.1*  MCV 99.4 97.1 97.3  PLT 184 121* 157      Microbiology:  No results found for this or any previous visit (from the past 720 hour(s)).  Coagulation Studies: No results for input(s): LABPROT, INR in the last 72 hours.  Urinalysis: No results for input(s): COLORURINE, LABSPEC, PHURINE, GLUCOSEU, HGBUR, BILIRUBINUR, KETONESUR, PROTEINUR, UROBILINOGEN, NITRITE, LEUKOCYTESUR in the last 72 hours.  Invalid input(s): APPERANCEUR    Imaging: Dg Chest 2 View  Result Date: 07/26/2016 CLINICAL DATA:  35 year old female with intermittent shortness of breath. Diabetes, hypertension, chronic kidney disease. Initial encounter. EXAM: CHEST  2 VIEW COMPARISON:  04/19/2015 and earlier. FINDINGS: Previously seen right chest dual lumen dialysis catheter has been removed. There is a new left subclavian to SVC region graft in place. Chronic bilateral axillary vascular stents. Mediastinal contours remain normal. Mildly lower lung volumes. Mild elevation of the right hemidiaphragm not significantly changed. No pneumothorax or pulmonary edema. No pleural effusion or acute pulmonary opacity. No acute osseous abnormality identified. IMPRESSION: 1.  No acute cardiopulmonary abnormality. 2. Revision of dialysis access since 2016. Electronically Signed   By: Genevie Ann M.D.   On: 07/26/2016 10:00     Medications:     .  acetaminophen  650 mg Oral Once  . amLODipine  10 mg Oral Daily  . aspirin  81 mg Oral Daily  . cholecalciferol  800 Units Oral Daily  . cloNIDine  0.3 mg Oral BID  . divalproex  1,000 mg Oral QHS  . epoetin (EPOGEN/PROCRIT) injection  4,000 Units Intravenous Q M,W,F-HD  . escitalopram  15 mg Oral Daily  . feeding supplement (NEPRO CARB STEADY)  237 mL Oral BID BM  . hydrALAZINE  100 mg Oral TID  . insulin aspart  0-15 Units Subcutaneous TID WC  . insulin  aspart  0-5 Units Subcutaneous QHS  . irbesartan  150 mg Oral QHS  . metoprolol tartrate  50 mg Oral BID  . QUEtiapine  200 mg Oral BID  . traZODone  100 mg Oral QHS   acetaminophen, heparin  Assessment/ Plan:  35 y.o. African-American female with developmental delay, ESRD, AOCD, SHPTH, HTN, schizophrenia, mild cognitive impairment, diabetes mellitus type II  [facility] MWF [address]  1. hyperkalemia with ESRD on HD MWF :  Seen and examined on hemodialysis.   Potassium level remains improved with dialysis - Patient is enjoying Nepro supplements - avoid potato chips, french fries  2. Anemia of CKD:  -epo with dialysis treatment - dose adjusted down - 4000 units with each treament  3. SHPTH: not currently on binders.  Phos at goal.   4. Hypertension: controlled.  - irbesartan, metoprolol, hydralazine, clonidine and amlodipine.      LOS: 1 Kelly [name] 9/1/201711:27 AM

## 2016-07-27 NOTE — Progress Notes (Addendum)
LCSW called and spoke to [place] They are going to pick up patient 10:30-11:30 Tuesday Sept 5th  Called [facility] [phone] and spoke with Duwayne Heck reports she thinks they have her down for next Wednesday. Received call fro [place] [phone]. She is requesting to speak to Dr Kingsley Callander nephrologist at [facility]. She thanked [place] for  sending the information but will now review policy on having a sitter with patient.  She is going to talk to her staff and [place] [facility]  And to review notes further and then call us back to she if this patient will be accepted.  LCSW called [facility] ( Dr Eduard Clos) and awaiting a call back.

## 2016-07-27 NOTE — ED Notes (Signed)
Patient at bedside washing up.

## 2016-07-27 NOTE — Progress Notes (Signed)
Pre Dialysis 

## 2016-07-27 NOTE — ED Provider Notes (Signed)
Vitals:   07/26/16 1446 07/26/16 2149  BP: 139/64 (!) 142/91  Pulse: 81 96  Resp: 18 18  Temp: 98.2 F (36.8 C)    Patient remains medically stable for psychiatric disposition.   Earleen Newport, MD 07/27/16 [phone]

## 2016-07-27 NOTE — Progress Notes (Signed)
Post Dialysis 

## 2016-07-27 NOTE — ED Notes (Signed)
Blood sugar was 156 and breakfast was given to patient

## 2016-07-27 NOTE — Progress Notes (Signed)
LCSW received a call from [place] who stated the Dr Alveria Apley read information and will not allow this patient to [facility].    [facility] in [place] [phone] fax # [phone]- LCSW called and was told to call Marlowe Kays back on Tuesday

## 2016-07-27 NOTE — ED Notes (Signed)
Patient to Dialysis.

## 2016-07-27 NOTE — ED Notes (Signed)
Lunch was left in room for patient

## 2016-07-27 NOTE — Progress Notes (Signed)
LCSW sent information to Director of SW.  Enis Slipper LCSW

## 2016-07-28 NOTE — ED Notes (Signed)
EMR clean up att, with CBG

## 2016-07-28 NOTE — ED Notes (Signed)
VOL/Pending placement 

## 2016-07-28 NOTE — ED Notes (Signed)
Pt's 0800 CBG 220 per NT. Results not crossing over into Epic at this time.

## 2016-07-28 NOTE — ED Notes (Signed)
Pt CBG 150 per NT. Results not crossing over into Epic at this time.

## 2016-07-28 NOTE — ED Notes (Signed)
Sunday dietary menu choices faxed to dietary.

## 2016-07-28 NOTE — ED Notes (Signed)
BEHAVIORAL HEALTH ROUNDING  Patient sleeping: No.  Patient alert and oriented: yes  Behavior appropriate: Yes. ; If no, describe:  Nutrition and fluids offered: Yes  Toileting and hygiene offered: Yes  Sitter present: not applicable, Q 15 min safety rounds and observation.  Law enforcement present: Yes ODS  

## 2016-07-28 NOTE — ED Notes (Signed)
Attempted to call dialysis downstairs to see if pt is on schedule and no answer at phone numbers, first call at [place], possible delay in meds d/t unsure of dialysis status

## 2016-07-28 NOTE — ED Provider Notes (Signed)
-----------------------------------------   7:45 AM on 07/28/2016 -----------------------------------------   Blood pressure (!) 183/89, pulse 81, temperature 98.2 F (36.8 C), temperature source Oral, resp. rate 16, height 5\' 2"  (1.575 m), weight 168 lb 11.2 oz (76.5 kg), SpO2 100 %.  The patient had no acute events since last update.  Calm and cooperative at this time.    The patient is still awaiting social work placement.     Loney Hering, MD 07/28/16 [phone]

## 2016-07-29 NOTE — ED Provider Notes (Signed)
-----------------------------------------   6:13 AM on 07/29/2016 -----------------------------------------   Blood pressure (!) 152/75, pulse 88, temperature 98.1 F (36.7 C), temperature source Oral, resp. rate 18, height 5\' 2"  (1.575 m), weight 168 lb 11.2 oz (76.5 kg), SpO2 100 %.  The patient had no acute events since last update.  Unfortunately the patient continues to await social work placement, very difficult placement given her chronic medical issues.     Harvest Dark, MD 07/29/16 [phone]

## 2016-07-29 NOTE — ED Notes (Signed)
BEHAVIORAL HEALTH ROUNDING Patient sleeping: Yes.   Patient alert and oriented: not applicable SLEEPING Behavior appropriate: Yes.  ; If no, describe: SLEEPING Nutrition and fluids offered: No SLEEPING Toileting and hygiene offered: NoSLEEPING Sitter present: not applicable, Q 15 min safety rounds and observation. Law enforcement present: Yes ODS 

## 2016-07-29 NOTE — Progress Notes (Signed)
LCSW reviewed HUB and notes and will forward information ( hand off) to weekday LCSW to consult with SW director. Our team is aware of current discharge complexities and will address them.  [employer] LCSW [phone]

## 2016-07-30 MED ORDER — AMLODIPINE BESYLATE 5 MG PO TABS
10.0000 mg | ORAL_TABLET | Freq: Every evening | ORAL | Status: DC
  Administered 2016-08-01 – 2016-08-03 (×3): 10 mg via ORAL

## 2016-07-30 MED ORDER — CLONIDINE HCL 0.1 MG PO TABS
0.1000 mg | ORAL_TABLET | Freq: Two times a day (BID) | ORAL | Status: DC
  Administered 2016-07-30 – 2016-08-02 (×7): 0.1 mg via ORAL
  Filled 2016-07-30 (×3): qty 1

## 2016-07-30 MED ORDER — IRBESARTAN 150 MG PO TABS
300.0000 mg | ORAL_TABLET | Freq: Every day | ORAL | Status: DC
  Administered 2016-07-30 – 2016-08-02 (×4): 300 mg via ORAL
  Filled 2016-07-30 (×2): qty 2

## 2016-07-30 NOTE — Progress Notes (Signed)
Pre HD  

## 2016-07-30 NOTE — Clinical Social Work Note (Signed)
CSW received official decline from [employer] Intake Department. Dorothea Ogle called CSW from [employer] this morning and stated patient was declined by both the [facility] and the [place] facilities with [facility]. Patient has also previously been declined by the [facility] and [facility] facilities as well. CSW Director, [employer], has been updated on today's phone call received by [employer]. Shela Leff MSW,LCSW [phone]

## 2016-07-30 NOTE — ED Notes (Signed)
BEHAVIORAL HEALTH ROUNDING Patient sleeping: No. Patient alert and oriented: yes Behavior appropriate: Yes.  ; If no, describe:  Nutrition and fluids offered: Yes  Toileting and hygiene offered: Yes  Sitter present: not applicable Law enforcement present: Yes  

## 2016-07-30 NOTE — ED Notes (Signed)
BEHAVIORAL HEALTH ROUNDING Patient sleeping: Yes.   Patient alert and oriented: not applicable SLEEPING Behavior appropriate: Yes.  ; If no, describe: SLEEPING Nutrition and fluids offered: No SLEEPING Toileting and hygiene offered: NoSLEEPING Sitter present: not applicable, Q 15 min safety rounds and observation. Law enforcement present: Yes ODS 

## 2016-07-30 NOTE — Progress Notes (Signed)
Subjective:   Patient seen during dialysis Tolerating well  No c/o today Resting quietly  HEMODIALYSIS FLOWSHEET:  Blood Flow Rate (mL/min): 400 mL/min Arterial Pressure (mmHg): -170 mmHg Venous Pressure (mmHg): 160 mmHg Transmembrane Pressure (mmHg): 20 mmHg Ultrafiltration Rate (mL/min): 860 mL/min Dialysate Flow Rate (mL/min): 800 ml/min Conductivity: Machine : 13.4 Conductivity: Machine : 13.4 Dialysis Fluid Bolus: Normal Saline Bolus Amount (mL): 250 mL Dialysate Change: 2K Intra-Hemodialysis Comments: 905. pt resting, vss.        Objective:  Vital signs in last 24 hours:  Temp:  [98.2 F (36.8 C)-98.6 F (37 C)] 98.6 F (37 C) (09/04 0945) Pulse Rate:  [86-91] 87 (09/04 1100) Resp:  [12-18] 12 (09/04 1100) BP: (109-153)/(58-87) 109/58 (09/04 1100) SpO2:  [97 %-100 %] 100 % (09/04 1100) Weight:  [81.1 kg (178 lb 12.7 oz)] 81.1 kg (178 lb 12.7 oz) (09/04 0945)  Weight change:  Filed Weights   07/27/16 1018 07/27/16 1432 07/30/16 0945  Weight: 79.2 kg (174 lb 9.6 oz) 76.5 kg (168 lb 11.2 oz) 81.1 kg (178 lb 12.7 oz)    Intake/Output:    Intake/Output Summary (Last 24 hours) at 07/30/16 1107 Last data filed at 07/29/16 2215  Gross per 24 hour  Intake              120 ml  Output                0 ml  Net              120 ml     Physical Exam: General: No acute distress, pleasant, sitting in dialysis chair  HEENT [place]/AT  Neck Supple   Pulm/lungs Normal breathing effort, clear to auscultation   CVS/Heart Regular rhythm, no rub or gallop, Systolic murmur present   Abdomen:  Soft, nontender, nondistended   Extremities: trace peripheral edema   Neurologic: Alert, oriented, x 3 follows commands   Skin: No acute rashes   Access: AV fistula        Basic Metabolic Panel:   Recent Labs Lab 07/25/16 0945 07/27/16 1020  NA 136 134*  K 5.0 4.2  CL 95* 92*  CO2 30 30  GLUCOSE 227* 240*  BUN 54* 73*  CREATININE 6.75* 7.96*  CALCIUM 9.2 9.5  PHOS  3.9 4.6     CBC:  Recent Labs Lab 07/25/16 0945 07/27/16 1020  WBC 3.5* 4.2  HGB 8.9* 9.9*  HCT 25.9* 29.1*  MCV 97.1 97.3  PLT 121* 157      Microbiology:  No results found for this or any previous visit (from the past 720 hour(s)).  Coagulation Studies: No results for input(s): LABPROT, INR in the last 72 hours.  Urinalysis: No results for input(s): COLORURINE, LABSPEC, PHURINE, GLUCOSEU, HGBUR, BILIRUBINUR, KETONESUR, PROTEINUR, UROBILINOGEN, NITRITE, LEUKOCYTESUR in the last 72 hours.  Invalid input(s): APPERANCEUR    Imaging: No results found.   Medications:     . acetaminophen  650 mg Oral Once  . amLODipine  10 mg Oral Daily  . aspirin  81 mg Oral Daily  . cholecalciferol  800 Units Oral Daily  . cloNIDine  0.3 mg Oral BID  . divalproex  1,000 mg Oral QHS  . epoetin (EPOGEN/PROCRIT) injection  4,000 Units Intravenous Q M,W,F-HD  . escitalopram  15 mg Oral Daily  . feeding supplement (NEPRO CARB STEADY)  237 mL Oral BID BM  . hydrALAZINE  100 mg Oral TID  . insulin aspart  0-15 Units Subcutaneous TID  WC  . insulin aspart  0-5 Units Subcutaneous QHS  . irbesartan  150 mg Oral QHS  . metoprolol tartrate  50 mg Oral BID  . QUEtiapine  200 mg Oral BID  . traZODone  100 mg Oral QHS   acetaminophen  Assessment/ Plan:  35 y.o. African-American female with developmental delay, ESRD, AOCD, SHPTH, HTN, schizophrenia, mild cognitive impairment, diabetes mellitus type II  [facility] MWF [address]  1. hyperkalemia with ESRD on HD MWF :  Seen and examined on hemodialysis.   Potassium level remains improved with dialysis -  Patient is enjoying Nepro supplements - avoid potato chips, french fries  2. Anemia of CKD:  -epo with dialysis treatment - dose adjusted down - 4000 units with each treament - Hgb 9.9  3. SHPTH: not currently on binders.  Phos at goal. 4.6  4. Hypertension: controlled.  - irbesartan, metoprolol, hydralazine,  clonidine and amlodipine.      LOS: 1 Thomas Mabry 9/4/201711:07 AM

## 2016-07-30 NOTE — ED Notes (Signed)
BEHAVIORAL HEALTH ROUNDING  Patient sleeping: No.  Patient alert and oriented: yes  Behavior appropriate: Yes. ; If no, describe:  Nutrition and fluids offered: Yes  Toileting and hygiene offered: Yes  Sitter present: not applicable, Q 15 min safety rounds and observation.  Law enforcement present: Yes ODS  

## 2016-07-30 NOTE — Progress Notes (Signed)
END OF HD TX

## 2016-07-30 NOTE — ED Notes (Signed)
Tech transported pt to dialysis. Pt bed cleaned and linens changed. HSK cleaned floor.

## 2016-07-30 NOTE — Progress Notes (Signed)
HD TX started  

## 2016-07-30 NOTE — ED Notes (Signed)
Lunch tray placed in pt room. Pt in dialysis.

## 2016-07-30 NOTE — ED Notes (Signed)
BEHAVIORAL HEALTH ROUNDING Patient sleeping: no Patient alert and oriented: yes Behavior appropriate: Yes.  ; If no, describe:  Nutrition and fluids offered: Yes  Toileting and hygiene offered: Yes  Sitter present: not applicable Law enforcement present: Yes

## 2016-07-30 NOTE — ED Notes (Signed)
ENVIRONMENTAL ASSESSMENT  Potentially harmful objects out of patient reach: Yes.  Personal belongings secured: Yes.  Patient dressed in hospital provided attire only: Yes.  Plastic bags out of patient reach: Yes.  Patient care equipment (cords, cables, call bells, lines, and drains) shortened, removed, or accounted for: Yes.  Equipment and supplies removed from bottom of stretcher: Yes.  Potentially toxic materials out of patient reach: Yes.  Sharps container removed or out of patient reach: Yes.   BEHAVIORAL HEALTH ROUNDING  Patient sleeping: No.  Patient alert and oriented: yes  Behavior appropriate: Yes. ; If no, describe:  Nutrition and fluids offered: Yes  Toileting and hygiene offered: Yes  Sitter present: not applicable, Q 15 min safety rounds and observation.  Law enforcement present: Yes ODS  ED [place]  Is the patient under IVC or is there intent for IVC: No Is the patient medically cleared: Yes.  Is there vacancy in the ED BHU: Yes.  Is the population mix appropriate for patient: No.  Is the patient awaiting placement in inpatient or outpatient setting: Yes.  Has the patient had a psychiatric consult: Yes.  Survey of unit performed for contraband, proper placement and condition of furniture, tampering with fixtures in bathroom, shower, and each patient room: Yes. ; Findings: All clear  APPEARANCE/BEHAVIOR  calm, cooperative and adequate rapport can be established  NEURO ASSESSMENT  Orientation: time, place and person  Hallucinations: No.None noted (Hallucinations)  Speech: Normal  Gait: normal  RESPIRATORY ASSESSMENT  WNL  CARDIOVASCULAR ASSESSMENT  WNL  GASTROINTESTINAL ASSESSMENT  WNL  EXTREMITIES  WNL  PLAN OF CARE  Provide calm/safe environment. Vital signs assessed twice daily. ED BHU Assessment once each 12-hour shift. Collaborate with TTS and Social Work daily or as condition indicates. Assure the ED provider has rounded once each  shift. Provide and encourage hygiene. Provide redirection as needed. Assess for escalating behavior; address immediately and inform ED provider.  Assess family dynamic and appropriateness for visitation as needed: Yes. ; If necessary, describe findings:  Educate the patient/family about BHU procedures/visitation: Yes. ; If necessary, describe findings: Pt is calm and cooperative at this time. Pt understanding and accepting of unit procedures/rules. Will continue to monitor with Q 15 min safety rounds and observation.

## 2016-07-30 NOTE — ED Provider Notes (Signed)
-----------------------------------------   6:22 AM on 07/30/2016 -----------------------------------------   Blood pressure (!) 148/72, pulse 87, temperature 98.3 F (36.8 C), temperature source Oral, resp. rate 16, height 5\' 2"  (1.575 m), weight 168 lb 11.2 oz (76.5 kg), SpO2 99 %.  The patient had no acute events since last update.  Calm and cooperative at this time.  Disposition is pending CSW team recommendations.     Paulette Blanch, MD 07/30/16 [phone]

## 2016-07-30 NOTE — ED Notes (Signed)

## 2016-07-30 NOTE — ED Notes (Addendum)
Pt hair was washed and linen was changed. Pt was given her last night time snack tray. Pt blood sugar prior to snack tray was 182. Pt is seen sitting in bed watching tv. Nothing is needed from staff at this time

## 2016-07-30 NOTE — Progress Notes (Signed)
Post hd vitals 

## 2016-07-30 NOTE — ED Notes (Signed)
Pt back from dialysis. Pt eating lunch. Given two warm blankets. CBG 123.

## 2016-07-30 NOTE — ED Notes (Signed)
Pt initial blood sugar before dinner tray was 153

## 2016-07-30 NOTE — ED Notes (Signed)
Pt given supper tray.

## 2016-07-30 NOTE — ED Notes (Signed)
BEHAVIORAL HEALTH ROUNDING Patient sleeping: Yes.   Patient alert and oriented: not applicable Behavior appropriate: Yes.  ; If no, describe:  Nutrition and fluids offered: No Toileting and hygiene offered: No Sitter present: not applicable Law enforcement present: Yes  

## 2016-07-30 NOTE — Progress Notes (Signed)
PRE HD assessment 

## 2016-07-30 NOTE — ED Notes (Signed)
CBG checked. Pt given breakfast tray.

## 2016-07-30 NOTE — Progress Notes (Signed)
POST HD ASSESSMENT

## 2016-07-30 NOTE — ED Notes (Signed)
Patient remains off ER unit in dialysis.

## 2016-07-31 NOTE — Clinical Social Work Note (Signed)
CSW called again to try and reach Mr. Kelly [name] at [facility] facility. Mr. Kelly [name] informed CSW that they felt as though she would be an inappropriate admission as patient had been at their "sister" facility, [facility] in the past and was discharged from that facility due to her behaviors. Mr. Kelly [name] stated that [facility] felt as though patient's behaviors were more psychiatric and more than their facility could assist with. Mr. Kelly [name] informed CSW that this was also true for [facility]. CSW has updated [employer] [facility] [place], [employer] as well as the ED CSW. Shela Leff MSW,LCSW [phone]

## 2016-07-31 NOTE — Clinical Social Work Note (Signed)
CSW called Wynona Neat, Admission's Coordinator For [employer], this morning. His number is: [phone]. Message left for Mr. Clegg in regards to following up on where patient might be on their waiting list. Shela Leff MSW,LCSW [phone]

## 2016-07-31 NOTE — ED Notes (Signed)
CBG 306 

## 2016-07-31 NOTE — Progress Notes (Signed)
CSW met with pt to give her an update on the placement process. CSW explained that the clinical social work team has found a group home placement for pt however, it has been difficult trying to transfer her dialysis services to the area that the group home is in. CSW assured pt that the clinical social work team continues to work on pt's case and will inform her of any updates. Pt expressed some frustration about being in the ED but stated that she understands placement has been difficult. CSW expressed her understanding, provided emotional support, and thanked pt for her continued patience.   CSW informed RN of status of pt's placement as well.  CSW will continue to follow pt and assist as needed.  [name], MSW, [place]

## 2016-07-31 NOTE — ED Notes (Signed)
Blood sugar was 212 notified nurse Val.

## 2016-07-31 NOTE — ED Notes (Signed)
Pt refusing meds at this time

## 2016-07-31 NOTE — ED Notes (Signed)
  CBG 286  

## 2016-07-31 NOTE — ED Notes (Signed)
Pt refusing evening medication and fingerstick. Refusing food. Dr Joni Fears aware. Pt oriented at this time.

## 2016-07-31 NOTE — ED Provider Notes (Signed)
-----------------------------------------   7:05 AM on 07/31/2016 -----------------------------------------   Blood pressure (!) 157/66, pulse 81, temperature 98.8 F (37.1 C), temperature source Oral, resp. rate 18, height 5\' 2"  (1.575 m), weight 170 lb 3.1 oz (77.2 kg), SpO2 94 %.  The patient had no acute events since last update.  Calm and cooperative at this time.    She is still awaiting placement at this time.     Loney Hering, MD 07/31/16 [phone]

## 2016-08-01 LAB — RENAL FUNCTION PANEL
ALBUMIN: 3.6 g/dL (ref 3.5–5.0)
ANION GAP: 14 (ref 5–15)
BUN: 82 mg/dL — ABNORMAL HIGH (ref 6–20)
CALCIUM: 9.6 mg/dL (ref 8.9–10.3)
CO2: 27 mmol/L (ref 22–32)
Chloride: 91 mmol/L — ABNORMAL LOW (ref 101–111)
Creatinine, Ser: 8.26 mg/dL — ABNORMAL HIGH (ref 0.44–1.00)
GFR calc non Af Amer: 6 mL/min — ABNORMAL LOW (ref >60)
GFR, EST AFRICAN AMERICAN: 7 mL/min — AB (ref >60)
Glucose, Bld: 272 mg/dL — ABNORMAL HIGH (ref 65–99)
PHOSPHORUS: 3.9 mg/dL (ref 2.5–4.6)
POTASSIUM: 4.9 mmol/L (ref 3.5–5.1)
SODIUM: 132 mmol/L — AB (ref 135–145)

## 2016-08-01 LAB — CBC
HCT: 31.2 % — ABNORMAL LOW (ref 35.0–47.0)
HEMOGLOBIN: 10.7 g/dL — AB (ref 12.0–16.0)
MCH: 33 pg (ref 26.0–34.0)
MCHC: 34.3 g/dL (ref 32.0–36.0)
MCV: 96.3 fL (ref 80.0–100.0)
Platelets: 172 10*3/uL (ref 150–440)
RBC: 3.24 MIL/uL — AB (ref 3.80–5.20)
RDW: 15.4 % — ABNORMAL HIGH (ref 11.5–14.5)
WBC: 5.2 10*3/uL (ref 3.6–11.0)

## 2016-08-01 MED ORDER — HYDRALAZINE HCL 50 MG PO TABS
ORAL_TABLET | ORAL | Status: AC
  Administered 2016-08-01: 100 mg via ORAL
  Filled 2016-08-01: qty 2

## 2016-08-01 MED ORDER — QUETIAPINE FUMARATE 200 MG PO TABS
ORAL_TABLET | ORAL | Status: AC
  Administered 2016-08-01: 200 mg via ORAL
  Filled 2016-08-01: qty 1

## 2016-08-01 MED ORDER — METOPROLOL TARTRATE 50 MG PO TABS
ORAL_TABLET | ORAL | Status: AC
  Administered 2016-08-01: 50 mg via ORAL
  Filled 2016-08-01: qty 1

## 2016-08-01 MED ORDER — INSULIN ASPART 100 UNIT/ML ~~LOC~~ SOLN
SUBCUTANEOUS | Status: AC
  Filled 2016-08-01: qty 5

## 2016-08-01 MED ORDER — DIVALPROEX SODIUM 500 MG PO DR TAB
DELAYED_RELEASE_TABLET | ORAL | Status: AC
  Administered 2016-08-01: 1000 mg via ORAL
  Filled 2016-08-01: qty 2

## 2016-08-01 MED ORDER — CHOLECALCIFEROL 10 MCG (400 UNIT) PO TABS
ORAL_TABLET | ORAL | Status: AC
  Filled 2016-08-01: qty 2

## 2016-08-01 MED ORDER — ASPIRIN 81 MG PO CHEW
CHEWABLE_TABLET | ORAL | Status: AC
  Filled 2016-08-01: qty 1

## 2016-08-01 MED ORDER — TRAZODONE HCL 100 MG PO TABS
ORAL_TABLET | ORAL | Status: AC
  Administered 2016-08-01: 100 mg via ORAL
  Filled 2016-08-01: qty 1

## 2016-08-01 MED ORDER — CLONIDINE HCL 0.1 MG PO TABS
ORAL_TABLET | ORAL | Status: AC
  Administered 2016-08-01: 0.1 mg via ORAL
  Filled 2016-08-01: qty 1

## 2016-08-01 MED ORDER — INSULIN ASPART 100 UNIT/ML ~~LOC~~ SOLN
SUBCUTANEOUS | Status: AC
  Administered 2016-08-01: 5 [IU] via SUBCUTANEOUS
  Filled 2016-08-01: qty 5

## 2016-08-01 MED ORDER — IRBESARTAN 150 MG PO TABS
ORAL_TABLET | ORAL | Status: AC
  Administered 2016-08-01: 300 mg via ORAL
  Filled 2016-08-01: qty 2

## 2016-08-01 MED ORDER — AMLODIPINE BESYLATE 5 MG PO TABS
ORAL_TABLET | ORAL | Status: AC
  Administered 2016-08-01: 10 mg via ORAL
  Filled 2016-08-01: qty 2

## 2016-08-01 MED ORDER — ESCITALOPRAM OXALATE 10 MG PO TABS
ORAL_TABLET | ORAL | Status: AC
  Filled 2016-08-01: qty 2

## 2016-08-01 MED ORDER — INSULIN ASPART 100 UNIT/ML ~~LOC~~ SOLN
SUBCUTANEOUS | Status: AC
  Administered 2016-08-01: 8 [IU] via SUBCUTANEOUS
  Filled 2016-08-01: qty 8

## 2016-08-01 MED ORDER — HEPARIN SODIUM (PORCINE) 1000 UNIT/ML DIALYSIS
20.0000 [IU]/kg | INTRAMUSCULAR | Status: DC | PRN
  Filled 2016-08-01: qty 2

## 2016-08-01 NOTE — ED Notes (Signed)
Christina from Dialysis called and is ready for pt downstairs. Asked to send a few packs of graham crackers with pt. Charge RN aware and is trying to find someone to transport pt downstairs to dialysis. Pt a/o and vss. BP meds were held prior to dialysis as ordered.

## 2016-08-01 NOTE — ED Provider Notes (Signed)
-----------------------------------------   6:47 AM on 08/01/2016 -----------------------------------------   Blood pressure (!) 186/87, pulse 81, temperature 98.4 F (36.9 C), temperature source Oral, resp. rate 18, height 5\' 2"  (1.575 m), weight 170 lb 3.1 oz (77.2 kg), SpO2 97 %.  The patient had no acute events since last update.  Calm and cooperative at this time.  Disposition is pending clinical social work team recommendations.     Paulette Blanch, MD 08/01/16 [phone]

## 2016-08-01 NOTE — Progress Notes (Signed)
CSW called pt's legal guardian Porfirio Mylar [phone] to update her on pt and also ask what progress Derl Barrow has made on her end. No answer, left a message. CSW awaiting call back.  CSW called pt's [facility] Scarlette Shorts [phone]. No answer, left a message. CSW awaiting call back.  CSW called Jonelle Sidle [phone] from [facility] to let her know that the dialysis transfer is still pending and therefore a d/c date cannot be set yet. CSW will continue to keep her updated.   [name], MSW, [place]

## 2016-08-01 NOTE — Progress Notes (Signed)
HD initiated 

## 2016-08-01 NOTE — ED Notes (Signed)
CBG 71.  

## 2016-08-01 NOTE — ED Notes (Signed)
VOL/Still pending placement

## 2016-08-01 NOTE — Progress Notes (Signed)
Pre hd assessment  

## 2016-08-01 NOTE — Clinical Social Work Note (Signed)
CSW received a call from [place] at the [facility]: [phone] stating that all the dialysis centers have to report to their agency and that they are going to try and advocate on behalf of patient and reach out to the Medical Directors at both [facility] and [employer] in the area where the family care home is located ([place] and [place] and [place]). Raquel Sarna stated that they have a 30 day trial agreement with the outpatient dialysis centers in which the medical director would agree to 30 days of patient coming to their clinic to determine if they indeed have behavioral issues and are able to demonstrate compliant behavior. They asked for the records and I have contacted Debbie at [facility] in [place] (the center where patient is currently established) and requested she fax the information to the [facility]. Shela Leff MSW,LCSW [phone]

## 2016-08-01 NOTE — ED Notes (Signed)
Patient back from dialysis.

## 2016-08-01 NOTE — ED Notes (Signed)
CBG 203 

## 2016-08-01 NOTE — ED Notes (Signed)
Patient OOR. In dialysis.

## 2016-08-01 NOTE — ED Notes (Signed)
Patient given apple juice and graham crackers.

## 2016-08-01 NOTE — Progress Notes (Signed)
End of hd tx  

## 2016-08-01 NOTE — Progress Notes (Signed)
Pre hd info 

## 2016-08-01 NOTE — ED Notes (Signed)
Pt transported to dialysis  Via wheelchair escorted by ED Tech.

## 2016-08-01 NOTE — Progress Notes (Signed)
Post hd assessment 

## 2016-08-01 NOTE — ED Notes (Addendum)
CBG- 188 

## 2016-08-01 NOTE — Progress Notes (Signed)
Subjective:  Patient seen and evaluated during hemodialysis. Potassium better today at 4.9. Also appears to be less swollen than previously noted.    HEMODIALYSIS FLOWSHEET:  Blood Flow Rate (mL/min): 350 mL/min Arterial Pressure (mmHg): -170 mmHg Venous Pressure (mmHg): 200 mmHg Transmembrane Pressure (mmHg): 50 mmHg Ultrafiltration Rate (mL/min): 1000 mL/min Dialysate Flow Rate (mL/min): 800 ml/min Conductivity: Machine : 13.8 Conductivity: Machine : 13.8 Dialysis Fluid Bolus: Normal Saline Bolus Amount (mL): 250 mL Dialysate Change: 2K Intra-Hemodialysis Comments: 1396. tolerating well. MD at bedside       Objective:  Vital signs in last 24 hours:  Temp:  [98.2 F (36.8 C)-98.5 F (36.9 C)] 98.5 F (36.9 C) (09/06 0940) Pulse Rate:  [81-104] 104 (09/06 1125) Resp:  [18-20] 18 (09/06 1125) BP: (148-186)/(69-100) 165/85 (09/06 1125) SpO2:  [97 %-100 %] 100 % (09/06 1125) Weight:  [81.2 kg (179 lb 0.2 oz)] 81.2 kg (179 lb 0.2 oz) (09/06 0940)  Weight change:  Filed Weights   07/30/16 0945 07/30/16 1326 08/01/16 0940  Weight: 81.1 kg (178 lb 12.7 oz) 77.2 kg (170 lb 3.1 oz) 81.2 kg (179 lb 0.2 oz)    Intake/Output:   No intake or output data in the 24 hours ending 08/01/16 1146   Physical Exam: General: No acute distress, pleasant, sitting in dialysis chair  HEENT [place]/AT  Neck Supple   Pulm/lungs Normal breathing effort, clear to auscultation   CVS/Heart Regular rhythm, no rub or gallop, Systolic murmur present   Abdomen:  Soft, nontender, nondistended   Extremities: trace peripheral edema   Neurologic: Alert, oriented, x 3 follows commands   Skin: No acute rashes   Access: AV fistula        Basic Metabolic Panel:   Recent Labs Lab 07/27/16 1020 08/01/16 0950  NA 134* 132*  K 4.2 4.9  CL 92* 91*  CO2 30 27  GLUCOSE 240* 272*  BUN 73* 82*  CREATININE 7.96* 8.26*  CALCIUM 9.5 9.6  PHOS 4.6 3.9     CBC:  Recent Labs Lab 07/27/16 1020  08/01/16 0950  WBC 4.2 5.2  HGB 9.9* 10.7*  HCT 29.1* 31.2*  MCV 97.3 96.3  PLT 157 172      Microbiology:  No results found for this or any previous visit (from the past 720 hour(s)).  Coagulation Studies: No results for input(s): LABPROT, INR in the last 72 hours.  Urinalysis: No results for input(s): COLORURINE, LABSPEC, PHURINE, GLUCOSEU, HGBUR, BILIRUBINUR, KETONESUR, PROTEINUR, UROBILINOGEN, NITRITE, LEUKOCYTESUR in the last 72 hours.  Invalid input(s): APPERANCEUR    Imaging: No results found.   Medications:     . acetaminophen  650 mg Oral Once  . amLODipine  10 mg Oral QPM  . aspirin  81 mg Oral Daily  . cholecalciferol      . cholecalciferol  800 Units Oral Daily  . cloNIDine  0.1 mg Oral BID  . divalproex  1,000 mg Oral QHS  . epoetin (EPOGEN/PROCRIT) injection  4,000 Units Intravenous Q M,W,F-HD  . escitalopram      . escitalopram  15 mg Oral Daily  . feeding supplement (NEPRO CARB STEADY)  237 mL Oral BID BM  . hydrALAZINE  100 mg Oral TID  . insulin aspart      . insulin aspart  0-15 Units Subcutaneous TID WC  . insulin aspart  0-5 Units Subcutaneous QHS  . irbesartan  300 mg Oral QHS  . metoprolol tartrate  50 mg Oral BID  . QUEtiapine  200 mg Oral BID  . traZODone  100 mg Oral QHS   acetaminophen, heparin  Assessment/ Plan:  35 y.o. African-American female with developmental delay, ESRD, AOCD, SHPTH, HTN, schizophrenia, mild cognitive impairment, diabetes mellitus type II  [facility] M[facility] [address]  1. hyperkalemia with ESRD on HD MWF :  Patient seen and evaluated during hemodialysis. Potassium today is acceptable at 4.9. Okay to continue irbesartan at this time.  2. Anemia of CKD:  -Hemoglobin currently 10.7. Continue Epogen with dialysis.  3. SHPTH: not currently on binders.  Phosphorus 3.9 and acceptable.  4. Hypertension: Blood pressure remains labile. Currently blood pressure is 165/85. - irbesartan, metoprolol,  hydralazine, clonidine and amlodipine.      LOS: 1 Avion Kutzer 9/6/201711:46 AM

## 2016-08-01 NOTE — Progress Notes (Signed)
Inpatient Diabetes Program Recommendations  AACE/ADA: New Consensus Statement on Inpatient Glycemic Control (2015)  Target Ranges:  Prepandial:   less than 140 mg/dL      Peak postprandial:   less than 180 mg/dL (1-2 hours)      Critically ill patients:  140 - 180 mg/dL   Results for Kelly [name], Kelly [name] (MRN [id number]) as of 08/01/2016 10:54  Ref. Range 08/01/2016 09:50  Glucose Latest Ref Range: 65 - 99 mg/dL 272 (H)    Review of Glycemic Control  Diabetes history: DM2 Outpatient Diabetes medications: Novolog 0-12 units TID with meals Current orders for Inpatient glycemic control: Novolog 0-15 units TID with meals, Novolog 0-5 units QHS  Inpatient Diabetes Program Recommendations:  Insulin - Basal: Please consider ordering Lantus 5 units QHS.   Thanks, Barnie Alderman, RN, MSN, CDE Diabetes Coordinator Inpatient Diabetes Program [phone] (Team Pager from [place] to [place]) [phone] ([facility] office) [phone] [facility] office) [phone] [facility] office)

## 2016-08-01 NOTE — ED Notes (Signed)
Patient refused shower ,but patient asked for pan of water and she is washing up at bedside.

## 2016-08-01 NOTE — ED Notes (Signed)
BREAKFAST WAS GIVEN TO PATIENT

## 2016-08-01 NOTE — ED Notes (Signed)
Per verbal order from Dr. Cinda Quest, give patient all blood pressure medications that were held but wait 30 minutes in between.

## 2016-08-01 NOTE — ED Notes (Signed)
Blood sugar was checked and notified [facility]

## 2016-08-01 NOTE — Progress Notes (Signed)
Post hd vitals 

## 2016-08-02 MED ORDER — HYDRALAZINE HCL 50 MG PO TABS
ORAL_TABLET | ORAL | Status: AC
  Administered 2016-08-02: 100 mg via ORAL
  Filled 2016-08-02: qty 2

## 2016-08-02 MED ORDER — ASPIRIN 81 MG PO CHEW
CHEWABLE_TABLET | ORAL | Status: AC
  Administered 2016-08-02: 81 mg via ORAL
  Filled 2016-08-02: qty 1

## 2016-08-02 MED ORDER — DIVALPROEX SODIUM 500 MG PO DR TAB
DELAYED_RELEASE_TABLET | ORAL | Status: AC
  Administered 2016-08-02: 1000 mg via ORAL
  Filled 2016-08-02: qty 2

## 2016-08-02 MED ORDER — HYDRALAZINE HCL 50 MG PO TABS
ORAL_TABLET | ORAL | Status: AC
  Filled 2016-08-02: qty 2

## 2016-08-02 MED ORDER — ESCITALOPRAM OXALATE 10 MG PO TABS
ORAL_TABLET | ORAL | Status: AC
  Administered 2016-08-03: 15 mg via ORAL
  Filled 2016-08-02: qty 2

## 2016-08-02 MED ORDER — METOPROLOL TARTRATE 50 MG PO TABS
ORAL_TABLET | ORAL | Status: AC
  Administered 2016-08-02: 50 mg via ORAL
  Filled 2016-08-02: qty 1

## 2016-08-02 MED ORDER — CLONIDINE HCL 0.1 MG PO TABS
ORAL_TABLET | ORAL | Status: AC
  Filled 2016-08-02: qty 1

## 2016-08-02 MED ORDER — QUETIAPINE FUMARATE 200 MG PO TABS
ORAL_TABLET | ORAL | Status: AC
  Administered 2016-08-02: 200 mg via ORAL
  Filled 2016-08-02: qty 1

## 2016-08-02 MED ORDER — INSULIN ASPART 100 UNIT/ML ~~LOC~~ SOLN
SUBCUTANEOUS | Status: AC
  Administered 2016-08-02: 2 [IU] via SUBCUTANEOUS
  Filled 2016-08-02: qty 2

## 2016-08-02 MED ORDER — CLONIDINE HCL 0.1 MG PO TABS
ORAL_TABLET | ORAL | Status: AC
  Administered 2016-08-02: 0.1 mg via ORAL
  Filled 2016-08-02: qty 1

## 2016-08-02 MED ORDER — METOPROLOL TARTRATE 50 MG PO TABS
ORAL_TABLET | ORAL | Status: AC
  Administered 2016-08-03: 50 mg via ORAL
  Filled 2016-08-02: qty 1

## 2016-08-02 MED ORDER — QUETIAPINE FUMARATE 200 MG PO TABS
ORAL_TABLET | ORAL | Status: AC
  Filled 2016-08-02: qty 1

## 2016-08-02 MED ORDER — INSULIN ASPART 100 UNIT/ML ~~LOC~~ SOLN
SUBCUTANEOUS | Status: AC
  Administered 2016-08-02: 3 [IU] via SUBCUTANEOUS
  Filled 2016-08-02: qty 3

## 2016-08-02 MED ORDER — IRBESARTAN 150 MG PO TABS
ORAL_TABLET | ORAL | Status: AC
  Administered 2016-08-02: 300 mg via ORAL
  Filled 2016-08-02: qty 2

## 2016-08-02 MED ORDER — AMLODIPINE BESYLATE 5 MG PO TABS
ORAL_TABLET | ORAL | Status: AC
  Administered 2016-08-02: 10 mg via ORAL
  Filled 2016-08-02: qty 2

## 2016-08-02 MED ORDER — CHOLECALCIFEROL 10 MCG (400 UNIT) PO TABS
ORAL_TABLET | ORAL | Status: AC
  Filled 2016-08-02: qty 2

## 2016-08-02 MED ORDER — TRAZODONE HCL 100 MG PO TABS
ORAL_TABLET | ORAL | Status: AC
  Administered 2016-08-02: 100 mg via ORAL
  Filled 2016-08-02: qty 1

## 2016-08-02 NOTE — Progress Notes (Signed)
Inpatient Diabetes Program Recommendations  AACE/ADA: New Consensus Statement on Inpatient Glycemic Control (2015)  Target Ranges:  Prepandial:   less than 140 mg/dL      Peak postprandial:   less than 180 mg/dL (1-2 hours)      Critically ill patients:  140 - 180 mg/dL   Lab Results  Component Value Date   GLUCAP 152 (H) 07/26/2016   HGBA1C 5.7 10/07/2012    Review of Glycemic Control  No blood sugars visible on the medical record in the glucose summary or on the results review tab.    Diabetes history: DM2 Outpatient Diabetes medications: Novolog 0-12 units TID with meals Current orders for Inpatient glycemic control: Novolog 0-15 units TID with meals, Novolog 0-5 units QHS  Inpatient Diabetes Program Recommendations: Continue to give insulin as per order if patient will allow you to give it. Spoke to RN- Floria refused insulin at lunch today 08/02/16.    Gentry Fitz, RN, BA, MHA, CDE Diabetes Coordinator Inpatient Diabetes Program  [phone] (Team Pager) [phone] ([place]) 08/02/2016 1:52 PM

## 2016-08-02 NOTE — ED Notes (Signed)
Pt is refusing daily meds at this time, states she will take them if a "little while"

## 2016-08-02 NOTE — ED Notes (Signed)
CBG 136 

## 2016-08-02 NOTE — Progress Notes (Signed)
CSW called Raquel Sarna from [facility] [phone]. CSW asked if there had been any update on pt's advocacy case. Raquel Sarna states that she will open the case and try to establish a 30 day trial for the pt. She mentioned that she will try the best she can, but there are no guarantees. CSW expressed her understanding. Raquel Sarna requested for the former discharge letter from [facility] to be faxed to her at [phone]. CSW stated that to her knowledge pt was never formerly discharged from [facility], the social work team has only been trying to transfer services. Raquel Sarna states that this may pose a problem because it will be harder to advocate for pt as she is already established somewhere else. CSW explained why [facility] [place] was no longer an option for pt. Raquel Sarna suggested that the doctor from [facility] write a letter to the doctor at [facility] advocating on pt's behalf.   CSW called [facility] (pt's former dialysis center) [phone] and spoke with Lattie Haw (Education officer, museum). CSW questioned if pt was ever formerly discharged from the center. Lattie Haw stated that pt was never discharged. CSW requested if the doctor would be willing to write a letter on the pt's behalf to the doctor at [facility]. Lattie Haw states that she will give the doctor the message and he will call CSW back. CSW thanked Lattie Haw and is awaiting a call back.  CSW called Scarlette Shorts [phone] pt's [place] Care Coordinator. Tsosie Billing states that she has staffed pt's case with her team and that the pt is able to go 30 mi outside of their Medicaid area and Medicaid will still pay for the services. Lynnell Grain has made calls to two dialysis centers in [place] and is waiting for a decision. Tsosie Billing has also contacted disability rights about pt's case.   CSW called pt's legal guardian Porfirio Mylar [phone] and informed her of above. Derl Barrow stated that she also plans to contact disability rights on behalf of the pt as well.  CSW will  continue to follow pt and assist as needed.  [name], MSW, [address]

## 2016-08-02 NOTE — ED Notes (Signed)
CBG 190

## 2016-08-02 NOTE — ED Provider Notes (Signed)
-----------------------------------------   6:23 AM on 08/02/2016 -----------------------------------------   Blood pressure (!) 156/112, pulse 94, temperature 98.1 F (36.7 C), resp. rate 19, height 5\' 2"  (1.575 m), weight 179 lb 0.2 oz (81.2 kg), SpO2 99 %.  The patient had no acute events since last update.  Calm and cooperative at this time.  Disposition is pending CSW team recommendations.     Paulette Blanch, MD 08/02/16 [phone]

## 2016-08-02 NOTE — ED Notes (Signed)
Pt given meal, states she eat when she gets up.

## 2016-08-02 NOTE — ED Notes (Signed)
FS 160

## 2016-08-03 LAB — PHOSPHORUS: Phosphorus: 4.7 mg/dL — ABNORMAL HIGH (ref 2.5–4.6)

## 2016-08-03 MED ORDER — CHOLECALCIFEROL 10 MCG (400 UNIT) PO TABS
800.0000 [IU] | ORAL_TABLET | Freq: Every day | ORAL | Status: DC
  Administered 2016-08-04 – 2016-10-02 (×53): 800 [IU] via ORAL
  Filled 2016-08-03 (×57): qty 2

## 2016-08-03 MED ORDER — DIVALPROEX SODIUM 500 MG PO DR TAB
1000.0000 mg | DELAYED_RELEASE_TABLET | Freq: Every day | ORAL | Status: DC
  Administered 2016-08-03 – 2016-10-01 (×58): 1000 mg via ORAL
  Filled 2016-08-03 (×58): qty 2

## 2016-08-03 MED ORDER — TRAZODONE HCL 100 MG PO TABS
ORAL_TABLET | ORAL | Status: AC
  Administered 2016-08-03: 100 mg via ORAL
  Filled 2016-08-03: qty 1

## 2016-08-03 MED ORDER — ESCITALOPRAM OXALATE 10 MG PO TABS
ORAL_TABLET | ORAL | Status: AC
  Filled 2016-08-03: qty 2

## 2016-08-03 MED ORDER — INSULIN ASPART 100 UNIT/ML ~~LOC~~ SOLN
SUBCUTANEOUS | Status: AC
  Administered 2016-08-03: 2 [IU] via SUBCUTANEOUS
  Filled 2016-08-03: qty 2

## 2016-08-03 MED ORDER — NEPRO/CARBSTEADY PO LIQD: 237.0000 mL | Freq: Two times a day (BID) | ORAL | Status: DC

## 2016-08-03 MED ORDER — ASPIRIN 81 MG PO CHEW
CHEWABLE_TABLET | ORAL | Status: AC
  Administered 2016-08-03: 81 mg via ORAL
  Filled 2016-08-03: qty 1

## 2016-08-03 MED ORDER — CLONIDINE HCL 0.1 MG PO TABS
0.1000 mg | ORAL_TABLET | Freq: Two times a day (BID) | ORAL | Status: DC
  Administered 2016-08-03 – 2016-10-02 (×97): 0.1 mg via ORAL
  Filled 2016-08-03 (×107): qty 1

## 2016-08-03 MED ORDER — ESCITALOPRAM OXALATE 10 MG PO TABS
15.0000 mg | ORAL_TABLET | Freq: Every day | ORAL | Status: DC
  Administered 2016-08-04 – 2016-10-02 (×55): 15 mg via ORAL
  Filled 2016-08-03 (×16): qty 2
  Filled 2016-08-03: qty 1
  Filled 2016-08-03 (×11): qty 2
  Filled 2016-08-03: qty 1
  Filled 2016-08-03 (×2): qty 2
  Filled 2016-08-03: qty 1
  Filled 2016-08-03 (×12): qty 2
  Filled 2016-08-03: qty 1
  Filled 2016-08-03 (×2): qty 2
  Filled 2016-08-03: qty 1
  Filled 2016-08-03 (×12): qty 2

## 2016-08-03 MED ORDER — CLONIDINE HCL 0.1 MG PO TABS
ORAL_TABLET | ORAL | Status: AC
  Administered 2016-08-03: 0.1 mg via ORAL
  Filled 2016-08-03: qty 1

## 2016-08-03 MED ORDER — DIVALPROEX SODIUM 500 MG PO DR TAB
DELAYED_RELEASE_TABLET | ORAL | Status: AC
  Administered 2016-08-03: 1000 mg via ORAL
  Filled 2016-08-03: qty 2

## 2016-08-03 MED ORDER — AMLODIPINE BESYLATE 5 MG PO TABS
10.0000 mg | ORAL_TABLET | Freq: Every evening | ORAL | Status: DC
  Administered 2016-08-03 – 2016-10-01 (×40): 10 mg via ORAL
  Filled 2016-08-03 (×52): qty 2

## 2016-08-03 MED ORDER — EPOETIN ALFA 4000 UNIT/ML IJ SOLN
4000.0000 [IU] | INTRAMUSCULAR | Status: DC
  Administered 2016-08-06 – 2016-10-01 (×23): 4000 [IU] via INTRAVENOUS
  Filled 2016-08-03 (×24): qty 1

## 2016-08-03 MED ORDER — ACETAMINOPHEN 325 MG PO TABS
650.0000 mg | ORAL_TABLET | Freq: Four times a day (QID) | ORAL | Status: DC | PRN
  Administered 2016-08-24: 650 mg via ORAL

## 2016-08-03 MED ORDER — ASPIRIN 81 MG PO CHEW
81.0000 mg | CHEWABLE_TABLET | Freq: Every day | ORAL | Status: DC
  Administered 2016-08-04 – 2016-10-02 (×53): 81 mg via ORAL
  Filled 2016-08-03 (×58): qty 1

## 2016-08-03 MED ORDER — QUETIAPINE FUMARATE 200 MG PO TABS
ORAL_TABLET | ORAL | Status: AC
  Administered 2016-08-03: 200 mg via ORAL
  Filled 2016-08-03: qty 1

## 2016-08-03 MED ORDER — IRBESARTAN 150 MG PO TABS
ORAL_TABLET | ORAL | Status: AC
  Administered 2016-08-03: 300 mg via ORAL
  Filled 2016-08-03: qty 2

## 2016-08-03 MED ORDER — METOPROLOL TARTRATE 50 MG PO TABS
50.0000 mg | ORAL_TABLET | Freq: Two times a day (BID) | ORAL | Status: DC
  Administered 2016-08-03 – 2016-10-02 (×95): 50 mg via ORAL
  Filled 2016-08-03 (×105): qty 1

## 2016-08-03 MED ORDER — METOPROLOL TARTRATE 50 MG PO TABS
ORAL_TABLET | ORAL | Status: AC
  Filled 2016-08-03: qty 1

## 2016-08-03 MED ORDER — INSULIN ASPART 100 UNIT/ML ~~LOC~~ SOLN
0.0000 [IU] | Freq: Three times a day (TID) | SUBCUTANEOUS | Status: DC
Start: 2016-08-04 — End: 2016-10-02
  Administered 2016-08-04: 3 [IU] via SUBCUTANEOUS
  Administered 2016-08-04: 5 [IU] via SUBCUTANEOUS
  Administered 2016-08-05: 8 [IU] via SUBCUTANEOUS
  Administered 2016-08-05: 2 [IU] via SUBCUTANEOUS
  Administered 2016-08-07 – 2016-08-08 (×3): 3 [IU] via SUBCUTANEOUS
  Administered 2016-08-08: 2 [IU] via SUBCUTANEOUS
  Administered 2016-08-08: 5 [IU] via SUBCUTANEOUS
  Administered 2016-08-09 (×2): 2 [IU] via SUBCUTANEOUS
  Administered 2016-08-09 – 2016-08-10 (×2): 5 [IU] via SUBCUTANEOUS
  Administered 2016-08-11: 2 [IU] via SUBCUTANEOUS
  Administered 2016-08-11 – 2016-08-12 (×3): 3 [IU] via SUBCUTANEOUS
  Administered 2016-08-12: 2 [IU] via SUBCUTANEOUS
  Administered 2016-08-13: 5 [IU] via SUBCUTANEOUS
  Administered 2016-08-13: 3 [IU] via SUBCUTANEOUS
  Administered 2016-08-14 (×2): 2 [IU] via SUBCUTANEOUS
  Administered 2016-08-15: 3 [IU] via SUBCUTANEOUS
  Administered 2016-08-16: 2 [IU] via SUBCUTANEOUS
  Administered 2016-08-16 – 2016-08-17 (×2): 8 [IU] via SUBCUTANEOUS
  Administered 2016-08-17 (×2): 2 [IU] via SUBCUTANEOUS
  Administered 2016-08-18: 3 [IU] via SUBCUTANEOUS
  Administered 2016-08-18: 5 [IU] via SUBCUTANEOUS
  Administered 2016-08-19: 3 [IU] via SUBCUTANEOUS
  Administered 2016-08-19: 2 [IU] via SUBCUTANEOUS
  Administered 2016-08-20: 5 [IU] via SUBCUTANEOUS
  Administered 2016-08-20: 3 [IU] via SUBCUTANEOUS
  Administered 2016-08-22 (×2): 5 [IU] via SUBCUTANEOUS
  Administered 2016-08-23: 3 [IU] via SUBCUTANEOUS
  Administered 2016-08-23: 8 [IU] via SUBCUTANEOUS
  Administered 2016-08-24 (×3): 3 [IU] via SUBCUTANEOUS
  Administered 2016-08-25: 2 [IU] via SUBCUTANEOUS
  Administered 2016-08-26: 5 [IU] via SUBCUTANEOUS
  Administered 2016-08-26: 3 [IU] via SUBCUTANEOUS
  Administered 2016-08-27 – 2016-08-28 (×2): 5 [IU] via SUBCUTANEOUS
  Administered 2016-08-29: 2 [IU] via SUBCUTANEOUS
  Administered 2016-08-29: 5 [IU] via SUBCUTANEOUS
  Administered 2016-08-30 – 2016-08-31 (×2): 3 [IU] via SUBCUTANEOUS
  Administered 2016-08-31: 5 [IU] via SUBCUTANEOUS
  Administered 2016-09-01: 3 [IU] via SUBCUTANEOUS
  Administered 2016-09-01 (×2): 2 [IU] via SUBCUTANEOUS
  Administered 2016-09-02: 3 [IU] via SUBCUTANEOUS
  Administered 2016-09-02: 2 [IU] via SUBCUTANEOUS
  Administered 2016-09-02 – 2016-09-03 (×2): 3 [IU] via SUBCUTANEOUS
  Administered 2016-09-03: 8 [IU] via SUBCUTANEOUS
  Administered 2016-09-03 – 2016-09-04 (×2): 3 [IU] via SUBCUTANEOUS
  Administered 2016-09-04: 5 [IU] via SUBCUTANEOUS
  Administered 2016-09-05: 3 [IU] via SUBCUTANEOUS
  Administered 2016-09-05: 5 [IU] via SUBCUTANEOUS
  Administered 2016-09-05: 3 [IU] via SUBCUTANEOUS
  Administered 2016-09-06: 2 [IU] via SUBCUTANEOUS
  Administered 2016-09-06 – 2016-09-07 (×2): 5 [IU] via SUBCUTANEOUS
  Administered 2016-09-08: 8 [IU] via SUBCUTANEOUS
  Administered 2016-09-08: 5 [IU] via SUBCUTANEOUS
  Administered 2016-09-09: 8 [IU] via SUBCUTANEOUS
  Administered 2016-09-09: 2 [IU] via SUBCUTANEOUS
  Administered 2016-09-10 (×2): 5 [IU] via SUBCUTANEOUS
  Administered 2016-09-11: 100 [IU] via SUBCUTANEOUS
  Administered 2016-09-13: 2 [IU] via SUBCUTANEOUS
  Administered 2016-09-13: 3 [IU] via SUBCUTANEOUS
  Administered 2016-09-13: 8 [IU] via SUBCUTANEOUS
  Administered 2016-09-15: 2 [IU] via SUBCUTANEOUS
  Administered 2016-09-15: 5 [IU] via SUBCUTANEOUS
  Administered 2016-09-16: 11 [IU] via SUBCUTANEOUS
  Administered 2016-09-16 – 2016-09-17 (×2): 3 [IU] via SUBCUTANEOUS
  Administered 2016-09-17: 11 [IU] via SUBCUTANEOUS
  Administered 2016-09-17: 2 [IU] via SUBCUTANEOUS
  Administered 2016-09-18: 15 [IU] via SUBCUTANEOUS
  Administered 2016-09-19: 8 [IU] via SUBCUTANEOUS
  Administered 2016-09-19: 5 [IU] via SUBCUTANEOUS
  Administered 2016-09-19: 11 [IU] via SUBCUTANEOUS
  Administered 2016-09-20: 8 [IU] via SUBCUTANEOUS
  Administered 2016-09-21: 3 [IU] via SUBCUTANEOUS
  Administered 2016-09-21: 5 [IU] via SUBCUTANEOUS
  Administered 2016-09-22: 3 [IU] via SUBCUTANEOUS
  Administered 2016-09-22: 5 [IU] via SUBCUTANEOUS
  Administered 2016-09-22: 3 [IU] via SUBCUTANEOUS
  Administered 2016-09-23 – 2016-09-24 (×4): 5 [IU] via SUBCUTANEOUS
  Administered 2016-09-25: 3 [IU] via SUBCUTANEOUS
  Administered 2016-09-25: 8 [IU] via SUBCUTANEOUS
  Administered 2016-09-26: 3 [IU] via SUBCUTANEOUS
  Administered 2016-09-26: 2 [IU] via SUBCUTANEOUS
  Administered 2016-09-26 – 2016-09-27 (×2): 5 [IU] via SUBCUTANEOUS
  Administered 2016-09-28: 3 [IU] via SUBCUTANEOUS
  Administered 2016-09-29: 8 [IU] via SUBCUTANEOUS
  Administered 2016-09-29: 3 [IU] via SUBCUTANEOUS
  Administered 2016-09-30: 8 [IU] via SUBCUTANEOUS
  Administered 2016-10-01: 5 [IU] via SUBCUTANEOUS
  Filled 2016-08-03: qty 2
  Filled 2016-08-03: qty 5
  Filled 2016-08-03: qty 8
  Filled 2016-08-03: qty 5
  Filled 2016-08-03: qty 3
  Filled 2016-08-03 (×2): qty 8
  Filled 2016-08-03: qty 5
  Filled 2016-08-03: qty 2
  Filled 2016-08-03 (×2): qty 3
  Filled 2016-08-03 (×4): qty 5
  Filled 2016-08-03: qty 2
  Filled 2016-08-03: qty 5
  Filled 2016-08-03 (×2): qty 3
  Filled 2016-08-03: qty 2
  Filled 2016-08-03: qty 5
  Filled 2016-08-03: qty 3
  Filled 2016-08-03: qty 2
  Filled 2016-08-03: qty 5
  Filled 2016-08-03: qty 3
  Filled 2016-08-03: qty 8
  Filled 2016-08-03 (×3): qty 5
  Filled 2016-08-03: qty 2
  Filled 2016-08-03: qty 5
  Filled 2016-08-03: qty 8
  Filled 2016-08-03: qty 5
  Filled 2016-08-03: qty 8
  Filled 2016-08-03: qty 5
  Filled 2016-08-03: qty 2
  Filled 2016-08-03: qty 3
  Filled 2016-08-03: qty 15
  Filled 2016-08-03: qty 3
  Filled 2016-08-03: qty 2
  Filled 2016-08-03: qty 3
  Filled 2016-08-03: qty 5
  Filled 2016-08-03 (×2): qty 3
  Filled 2016-08-03: qty 2
  Filled 2016-08-03: qty 5
  Filled 2016-08-03: qty 2
  Filled 2016-08-03: qty 8
  Filled 2016-08-03: qty 3
  Filled 2016-08-03: qty 2
  Filled 2016-08-03: qty 8
  Filled 2016-08-03: qty 3
  Filled 2016-08-03: qty 2
  Filled 2016-08-03: qty 3
  Filled 2016-08-03: qty 2
  Filled 2016-08-03: qty 5
  Filled 2016-08-03: qty 8
  Filled 2016-08-03 (×2): qty 2
  Filled 2016-08-03: qty 5
  Filled 2016-08-03: qty 1
  Filled 2016-08-03: qty 3
  Filled 2016-08-03: qty 5
  Filled 2016-08-03: qty 3
  Filled 2016-08-03 (×2): qty 2
  Filled 2016-08-03: qty 5
  Filled 2016-08-03: qty 3
  Filled 2016-08-03: qty 11
  Filled 2016-08-03: qty 2
  Filled 2016-08-03: qty 5
  Filled 2016-08-03: qty 3
  Filled 2016-08-03: qty 5
  Filled 2016-08-03: qty 4
  Filled 2016-08-03: qty 5
  Filled 2016-08-03: qty 11
  Filled 2016-08-03: qty 3
  Filled 2016-08-03: qty 8
  Filled 2016-08-03: qty 11
  Filled 2016-08-03 (×2): qty 3
  Filled 2016-08-03: qty 2
  Filled 2016-08-03: qty 3
  Filled 2016-08-03: qty 8
  Filled 2016-08-03: qty 5
  Filled 2016-08-03: qty 3
  Filled 2016-08-03: qty 5
  Filled 2016-08-03 (×5): qty 3
  Filled 2016-08-03 (×3): qty 2
  Filled 2016-08-03: qty 3
  Filled 2016-08-03: qty 2
  Filled 2016-08-03: qty 8
  Filled 2016-08-03 (×2): qty 5
  Filled 2016-08-03: qty 3
  Filled 2016-08-03: qty 5
  Filled 2016-08-03 (×2): qty 2
  Filled 2016-08-03 (×3): qty 5
  Filled 2016-08-03: qty 2
  Filled 2016-08-03: qty 1
  Filled 2016-08-03: qty 3
  Filled 2016-08-03: qty 1
  Filled 2016-08-03: qty 8
  Filled 2016-08-03 (×2): qty 3
  Filled 2016-08-03 (×2): qty 5
  Filled 2016-08-03: qty 3

## 2016-08-03 MED ORDER — METOPROLOL TARTRATE 50 MG PO TABS
ORAL_TABLET | ORAL | Status: AC
  Administered 2016-08-03: 50 mg via ORAL
  Filled 2016-08-03: qty 1

## 2016-08-03 MED ORDER — INSULIN ASPART 100 UNIT/ML ~~LOC~~ SOLN
SUBCUTANEOUS | Status: AC
  Administered 2016-08-03: 8 [IU] via SUBCUTANEOUS
  Filled 2016-08-03: qty 8

## 2016-08-03 MED ORDER — QUETIAPINE FUMARATE 200 MG PO TABS
200.0000 mg | ORAL_TABLET | Freq: Two times a day (BID) | ORAL | Status: DC
  Administered 2016-08-03 – 2016-10-02 (×106): 200 mg via ORAL
  Filled 2016-08-03 (×109): qty 1

## 2016-08-03 MED ORDER — IRBESARTAN 150 MG PO TABS
300.0000 mg | ORAL_TABLET | Freq: Every day | ORAL | Status: DC
  Administered 2016-08-03 – 2016-10-01 (×54): 300 mg via ORAL
  Filled 2016-08-03 (×56): qty 2

## 2016-08-03 MED ORDER — TRAZODONE HCL 100 MG PO TABS
100.0000 mg | ORAL_TABLET | Freq: Every day | ORAL | Status: DC
  Administered 2016-08-03 – 2016-09-30 (×55): 100 mg via ORAL
  Filled 2016-08-03 (×55): qty 1

## 2016-08-03 MED ORDER — INSULIN ASPART 100 UNIT/ML ~~LOC~~ SOLN
0.0000 [IU] | Freq: Every day | SUBCUTANEOUS | Status: DC
  Administered 2016-08-05: 3 [IU] via SUBCUTANEOUS
  Administered 2016-08-07 – 2016-08-12 (×2): 2 [IU] via SUBCUTANEOUS
  Administered 2016-08-14: 3 [IU] via SUBCUTANEOUS
  Administered 2016-08-16: 5 [IU] via SUBCUTANEOUS
  Administered 2016-08-18: 2 [IU] via SUBCUTANEOUS
  Administered 2016-08-19: 3 [IU] via SUBCUTANEOUS
  Administered 2016-08-21: 2 [IU] via SUBCUTANEOUS
  Administered 2016-08-25: 3 [IU] via SUBCUTANEOUS
  Administered 2016-08-25 – 2016-09-06 (×4): 2 [IU] via SUBCUTANEOUS
  Administered 2016-09-07: 3 [IU] via SUBCUTANEOUS
  Administered 2016-09-08: 2 [IU] via SUBCUTANEOUS
  Administered 2016-09-10: 3 [IU] via SUBCUTANEOUS
  Administered 2016-09-13: 2 [IU] via SUBCUTANEOUS
  Administered 2016-09-14 – 2016-09-15 (×2): 4 [IU] via SUBCUTANEOUS
  Administered 2016-09-20 – 2016-09-22 (×3): 2 [IU] via SUBCUTANEOUS
  Administered 2016-09-24: 3 [IU] via SUBCUTANEOUS
  Administered 2016-09-26: 5 [IU] via SUBCUTANEOUS
  Administered 2016-09-30: 2 [IU] via SUBCUTANEOUS
  Filled 2016-08-03 (×2): qty 2
  Filled 2016-08-03: qty 4
  Filled 2016-08-03: qty 2
  Filled 2016-08-03: qty 3
  Filled 2016-08-03 (×2): qty 2
  Filled 2016-08-03: qty 3
  Filled 2016-08-03: qty 4
  Filled 2016-08-03: qty 3
  Filled 2016-08-03 (×5): qty 2
  Filled 2016-08-03 (×6): qty 3

## 2016-08-03 MED ORDER — HEPARIN SODIUM (PORCINE) 1000 UNIT/ML DIALYSIS
20.0000 [IU]/kg | INTRAMUSCULAR | Status: DC | PRN
  Filled 2016-08-03: qty 2

## 2016-08-03 MED ORDER — HYDRALAZINE HCL 50 MG PO TABS
100.0000 mg | ORAL_TABLET | Freq: Three times a day (TID) | ORAL | Status: DC
  Administered 2016-08-03 – 2016-09-14 (×85): 100 mg via ORAL
  Filled 2016-08-03 (×91): qty 2

## 2016-08-03 MED ORDER — AMLODIPINE BESYLATE 5 MG PO TABS
ORAL_TABLET | ORAL | Status: AC
  Administered 2016-08-03: 10 mg via ORAL
  Filled 2016-08-03: qty 2

## 2016-08-03 NOTE — ED Notes (Signed)
Pt room cleaned while pt at dialysis, linen changed and bed wiped down.

## 2016-08-03 NOTE — ED Notes (Signed)
Pt transported to dialysis by EDT Beverlee Nims)

## 2016-08-03 NOTE — Progress Notes (Signed)
Pre HD  

## 2016-08-03 NOTE — Progress Notes (Signed)
LCSW consulted with Director of Social Work, [employer]. LCSW was informed and updated on patient current situation.   [place] Coordinator ( [place] KPQAE)[phone] that [facility] has been unable to present this patients case to their Director of this dialysis center. They have had an influx of new patients (  Displaced FLOOD Patients) that have been picked up by this center. They will bring our patients information to Director on Monday.   LCSW did review this patient information with Joanna Hews and they feel patient is not suitable for their homes at this time.  [employer] LCSW [phone]

## 2016-08-03 NOTE — ED Notes (Signed)
Patient was given a coloring book and crayons and shampoo by Dr. Weber Cooks. Patient now agrees to having VS taken and a fingerstick.

## 2016-08-03 NOTE — Progress Notes (Signed)
Subjective:  Patient seen and evaluated during hemodialysis. Tolerating well. Ultrafiltration target 2.5 kg today.     Objective:  Vital signs in last 24 hours:  Temp:  [98.1 F (36.7 C)-98.6 F (37 C)] 98.1 F (36.7 C) (09/08 0945) Pulse Rate:  [94-103] 103 (09/08 1100) Resp:  [16-20] 18 (09/08 1100) BP: (115-158)/(64-101) 150/81 (09/08 1100) SpO2:  [97 %-100 %] 100 % (09/08 1100) Weight:  [80.6 kg (177 lb 9.6 oz)] 80.6 kg (177 lb 9.6 oz) (09/08 0945)  Weight change:  Filed Weights   07/30/16 1326 08/01/16 0940 08/03/16 0945  Weight: 77.2 kg (170 lb 3.1 oz) 81.2 kg (179 lb 0.2 oz) 80.6 kg (177 lb 9.6 oz)    Intake/Output:   No intake or output data in the 24 hours ending 08/03/16 1119   Physical Exam: General: No acute distress, sitting in dialysis chair  HEENT [place]/AT EOMI hearing intact  Neck Supple   Pulm/lungs Normal breathing effort, clear to auscultation   CVS/Heart Regular rhythm, no rub or gallop, Systolic murmur present   Abdomen:  Soft, nontender, nondistended   Extremities: trace peripheral edema   Neurologic: Alert, oriented, x 3 follows commands   Skin: No acute rashes   Access: AV fistula        Basic Metabolic Panel:   Recent Labs Lab 08/01/16 0950 08/03/16 0955  NA 132*  --   K 4.9  --   CL 91*  --   CO2 27  --   GLUCOSE 272*  --   BUN 82*  --   CREATININE 8.26*  --   CALCIUM 9.6  --   PHOS 3.9 4.7*     CBC:  Recent Labs Lab 08/01/16 0950  WBC 5.2  HGB 10.7*  HCT 31.2*  MCV 96.3  PLT 172      Microbiology:  No results found for this or any previous visit (from the past 720 hour(s)).  Coagulation Studies: No results for input(s): LABPROT, INR in the last 72 hours.  Urinalysis: No results for input(s): COLORURINE, LABSPEC, PHURINE, GLUCOSEU, HGBUR, BILIRUBINUR, KETONESUR, PROTEINUR, UROBILINOGEN, NITRITE, LEUKOCYTESUR in the last 72 hours.  Invalid input(s): APPERANCEUR    Imaging: No results  found.   Medications:     . acetaminophen  650 mg Oral Once  . amLODipine  10 mg Oral QPM  . aspirin  81 mg Oral Daily  . cholecalciferol  800 Units Oral Daily  . cloNIDine  0.1 mg Oral BID  . divalproex  1,000 mg Oral QHS  . epoetin (EPOGEN/PROCRIT) injection  4,000 Units Intravenous Q M,W,F-HD  . escitalopram  15 mg Oral Daily  . feeding supplement (NEPRO CARB STEADY)  237 mL Oral BID BM  . hydrALAZINE  100 mg Oral TID  . insulin aspart  0-15 Units Subcutaneous TID WC  . insulin aspart  0-5 Units Subcutaneous QHS  . irbesartan  300 mg Oral QHS  . metoprolol tartrate  50 mg Oral BID  . QUEtiapine  200 mg Oral BID  . traZODone  100 mg Oral QHS   acetaminophen, heparin  Assessment/ Plan:  35 y.o. African-American female with developmental delay, ESRD, AOCD, SHPTH, HTN, schizophrenia, mild cognitive impairment, diabetes mellitus type II  [facility] M[facility] [address]  1. hyperkalemia with ESRD on HD MWF : Pt seen during dialysis, tolerating well, UF target 2.5kg.   2. Anemia of CKD:  -continue epogen 4000 units IV with dialysis.  3. SHPTH: not currently on binders.  Repeat phos  today.    4. Hypertension: Blood pressure remains labile. Currently blood pressure is 150/81. - irbesartan, metoprolol, hydralazine, clonidine and amlodipine.      LOS: 1 Kelly [name] 9/8/201711:19 AM

## 2016-08-03 NOTE — ED Notes (Signed)
Patient transported to Dialysis

## 2016-08-03 NOTE — Progress Notes (Signed)
LCSW received a call from Dr Kingsley Callander. He is willing to write a note to advocate on patients behalf. LCSW requested he speak to [facility] directly to see what that would entail. In discussion Kelly [name] is willing to have patient attend her out patient days at the [facility] as a cost savings measure.  Lynnex Fulp LCSW [place]

## 2016-08-03 NOTE — ED Notes (Signed)
Glucose-Capillary: 166 mg/dL

## 2016-08-03 NOTE — Progress Notes (Signed)
Pre HD assessment  

## 2016-08-03 NOTE — ED Provider Notes (Signed)
-----------------------------------------   7:36 AM on 08/03/2016 -----------------------------------------   Blood pressure (!) 157/101, pulse 100, temperature 98.4 F (36.9 C), temperature source Oral, resp. rate 18, height 5\' 2"  (1.575 m), weight 179 lb 0.2 oz (81.2 kg), SpO2 99 %.  The patient had no acute events since last update.  Calm and cooperative at this time.  Disposition is pending clinical social work team recommendations.  Patient should be having dialysis today.     Paulette Blanch, MD 08/03/16 [phone]

## 2016-08-03 NOTE — Progress Notes (Signed)
HD initiated without issue  

## 2016-08-03 NOTE — ED Notes (Signed)
Attempted to wake pt up three times in order to take VS and blood glucose,pt refused for VS to be taken at this time. Pt informed that we have certain times of the day/night that we need to take VS and pt still refused to have VS taken. Ena Dawley informed about this.

## 2016-08-03 NOTE — Progress Notes (Signed)
Hemodialysis completed without issue

## 2016-08-03 NOTE — Progress Notes (Signed)
Post HD assessment  

## 2016-08-04 MED ORDER — NEPRO/CARBSTEADY PO LIQD
237.0000 mL | Freq: Two times a day (BID) | ORAL | Status: DC
  Administered 2016-08-05 – 2016-10-02 (×67): 237 mL via ORAL

## 2016-08-04 NOTE — ED Notes (Signed)
Pt given another blanket and tucked in by this RN, tv and lights off att

## 2016-08-04 NOTE — ED Provider Notes (Signed)
-----------------------------------------   7:17 AM on 08/04/2016 -----------------------------------------   Blood pressure (!) 168/105, pulse (!) 106, temperature 98.6 F (37 C), temperature source Oral, resp. rate 17, height 5\' 2"  (1.575 m), weight 171 lb (77.6 kg), SpO2 100 %.  The patient had no acute events since last update.  Calm and cooperative at this time.  Disposition is pending Psychiatry/Behavioral Medicine team recommendations.     Joanne Gavel, MD 08/04/16 [phone]

## 2016-08-04 NOTE — ED Notes (Signed)
Pt refused 1400 VS

## 2016-08-04 NOTE — ED Notes (Signed)
PT VOLUNTARY PENDING PLACEMENT.

## 2016-08-04 NOTE — ED Notes (Signed)
Obstinate and childish behavior: insists on having pills and and drink put in her mouth - pt just stares at the pills and cup and makes sucking noises if attempts are made to hand them to pt. This woefully stubborn behavior, not to mention the bargaining involved in order to get the pt to agree to CBG's and vitals, retards care for this pt and all other pt's for this RN.  This RN is no longer enforcing independence on behalf of the pt for ADLs but readily acquiescing to this pedantic behavior in order not to further stall productivity but to give the pt needed pharmacological interventions.

## 2016-08-04 NOTE — Progress Notes (Signed)
LCSW and TTS consulted and there are no changes presently. In visiting with patient she was calm and polite and understands we are workimg to find suitable supports for her.  LCSW called [place] home providers called and left message and is awaiting a call back.  At present, Monday weekday LCSW will follow up with the following parties;  [place] [phone]  Care Coordinator Rich Fuchs [phone]  Advocacy worker Raquel Sarna [phone]   Enis Slipper LCSW [phone]

## 2016-08-05 NOTE — ED Notes (Signed)
CBG 147 

## 2016-08-05 NOTE — ED Notes (Signed)
  CBG 286  

## 2016-08-05 NOTE — ED Notes (Signed)
EMR clean-up from previous shifts, completed at 08/04/2016 2030

## 2016-08-05 NOTE — ED Notes (Addendum)
cbg 73 pt drinking supplement at this time

## 2016-08-05 NOTE — ED Notes (Signed)
Dietary called. Nurse requested 2nd dose of Nepro Carb Steady to be sent to unit.

## 2016-08-05 NOTE — ED Notes (Addendum)
CBG 252

## 2016-08-05 NOTE — ED Notes (Signed)
Pt provided with 2 warm blankets, pt lying in bed at this time

## 2016-08-05 NOTE — ED Notes (Signed)
CBG is 183, pt info not recognized, performed by Edison Nasuti, NT  EMR clean-up from previous shifts, completed at 08/04/2016 2030

## 2016-08-06 NOTE — Progress Notes (Signed)
CSW called Mariel Craft (director of [facility]) [phone] to follow up on pt referral. No answer, left a message. CSW awaiting call back.  CSW called Raquel Sarna ([facility] advocate) [phone] to follow up on pt's advocacy case. No answer, left a message. CSW awaiting call back.  CSW called Scarlette Shorts (pt's [facility] Care coordinator) [phone] for an update on pt's case. Tsosie Billing states that she has not heard back from any of the dialysis centers that she has referred pt to. She also reports that a case has been filed with disability rights and pt was assigned an attorney.   CSW will continue to follow pt and assist as needed.   [name], MSW, [place]

## 2016-08-06 NOTE — Progress Notes (Signed)
Post dialysis 

## 2016-08-06 NOTE — Progress Notes (Signed)
Subjective:  Patient seen and evaluated during hemodialysis. Tolerating well. Ultrafiltration target 2.5 kg today.   Objective:  Vital signs in last 24 hours:  Temp:  [98 F (36.7 C)-98.9 F (37.2 C)] 98.9 F (37.2 C) (09/11 1308) Pulse Rate:  [86-107] 104 (09/11 1308) Resp:  [12-24] 18 (09/11 1308) BP: (133-168)/(68-137) 151/81 (09/11 1308) SpO2:  [97 %-100 %] 99 % (09/11 1308) Weight:  [81.4 kg (179 lb 7.3 oz)] 81.4 kg (179 lb 7.3 oz) (09/11 0922)  Weight change:  Filed Weights   08/03/16 0945 08/03/16 1330 08/06/16 0922  Weight: 80.6 kg (177 lb 9.6 oz) 77.6 kg (171 lb) 81.4 kg (179 lb 7.3 oz)    Intake/Output:    Intake/Output Summary (Last 24 hours) at 08/06/16 1317 Last data filed at 08/06/16 1308  Gross per 24 hour  Intake                0 ml  Output             2500 ml  Net            -2500 ml     Physical Exam: General: No acute distress, sitting in dialysis chair  HEENT [place]/AT EOMI hearing intact  Neck Supple   Pulm/lungs Normal breathing effort, clear to auscultation   CVS/Heart Regular rhythm, no rub or gallop, Systolic murmur present   Abdomen:  Soft, nontender, nondistended   Extremities: No peripheral edema   Neurologic: Alert, oriented, x 3 follows commands   Skin: No acute rashes   Access: AV fistula        Basic Metabolic Panel:   Recent Labs Lab 08/01/16 0950 08/03/16 0955  NA 132*  --   K 4.9  --   CL 91*  --   CO2 27  --   GLUCOSE 272*  --   BUN 82*  --   CREATININE 8.26*  --   CALCIUM 9.6  --   PHOS 3.9 4.7*     CBC:  Recent Labs Lab 08/01/16 0950  WBC 5.2  HGB 10.7*  HCT 31.2*  MCV 96.3  PLT 172      Microbiology:  No results found for this or any previous visit (from the past 720 hour(s)).  Coagulation Studies: No results for input(s): LABPROT, INR in the last 72 hours.  Urinalysis: No results for input(s): COLORURINE, LABSPEC, PHURINE, GLUCOSEU, HGBUR, BILIRUBINUR, KETONESUR, PROTEINUR, UROBILINOGEN,  NITRITE, LEUKOCYTESUR in the last 72 hours.  Invalid input(s): APPERANCEUR    Imaging: No results found.   Medications:     . acetaminophen  650 mg Oral Once  . amLODipine  10 mg Oral QPM  . aspirin  81 mg Oral Daily  . cholecalciferol  800 Units Oral Daily  . cloNIDine  0.1 mg Oral BID  . divalproex  1,000 mg Oral QHS  . epoetin (EPOGEN/PROCRIT) injection  4,000 Units Intravenous Q M,W,F-HD  . escitalopram  15 mg Oral Daily  . feeding supplement (NEPRO CARB STEADY)  237 mL Oral BID BM  . hydrALAZINE  100 mg Oral TID  . insulin aspart  0-15 Units Subcutaneous TID WC  . insulin aspart  0-5 Units Subcutaneous QHS  . irbesartan  300 mg Oral QHS  . metoprolol tartrate  50 mg Oral BID  . QUEtiapine  200 mg Oral BID  . traZODone  100 mg Oral QHS   acetaminophen, heparin  Assessment/ Plan:  35 y.o. African-American female with developmental delay, ESRD, AOCD, SHPTH, HTN,  schizophrenia, mild cognitive impairment, diabetes mellitus type II  [facility] M[facility] [address]  1. hyperkalemia with ESRD on HD MWF : Pt seen during dialysis, tolerating well, UF target 2.5kg.   2. Anemia of CKD:  -continue epogen 4000 units IV with dialysis.  3. SHPTH: not currently on binders. Phosphorus at goal.   4. Hypertension: Blood pressure remains labile.  - irbesartan, metoprolol, hydralazine, clonidine and amlodipine.      LOS: [place], Kirin Brandenburger 9/11/20171:17 PM

## 2016-08-06 NOTE — ED Notes (Signed)
Pt refused vitals and meds at this time.  Pt back to sleep at this time.

## 2016-08-06 NOTE — Progress Notes (Signed)
Dialysis complete

## 2016-08-06 NOTE — ED Provider Notes (Signed)
-----------------------------------------   7:00 AM on 08/06/2016 -----------------------------------------   Blood pressure (!) 150/73, pulse 96, temperature 98.2 F (36.8 C), temperature source Oral, resp. rate 18, height 5\' 2"  (1.575 m), weight 171 lb (77.6 kg), SpO2 100 %.  The patient had no acute events since last update.  Calm and cooperative at this time.  Disposition is pending Psychiatry/Behavioral Medicine team recommendations.     Carrie Mew, MD 08/06/16 0700

## 2016-08-06 NOTE — Progress Notes (Signed)
Pre Dialysis 

## 2016-08-06 NOTE — ED Notes (Signed)
Pt in dialysis. Lunch tray placed at bedside.

## 2016-08-06 NOTE — Progress Notes (Signed)
Dialysis started 

## 2016-08-07 NOTE — ED Notes (Signed)
Pt given two warm blankets.  

## 2016-08-07 NOTE — ED Notes (Signed)
CBG 217  

## 2016-08-07 NOTE — ED Notes (Signed)
Pt's blood sugar 159, meter not docking to patient's chart.

## 2016-08-07 NOTE — ED Notes (Signed)
Pt given carton of milk at this time.

## 2016-08-07 NOTE — ED Provider Notes (Signed)
-----------------------------------------   7:11 AM on 08/07/2016 -----------------------------------------   Blood pressure (!) 192/94, pulse 96, temperature 98.6 F (37 C), temperature source Oral, resp. rate 18, height 5\' 2"  (1.575 m), weight 179 lb 7.3 oz (81.4 kg), SpO2 98 %.  The patient had no acute events since last update.  Calm and cooperative at this time.    The patient received dialysis yesterday. She also received her blood pressure medications at 3 AM. She had missed her nighttime dosing. The patient is still awaiting placement.     Loney Hering, MD 08/07/16 [phone]

## 2016-08-07 NOTE — Progress Notes (Addendum)
CSW called [facility] and spoke with Mariel Craft Civil engineer, contracting) [phone]. CSW inquired about the status of pt's dialysis transfer referral. Maudie Mercury states that she is going to e-mail her administrator and ask them to review the case. Maudie Mercury will then call CSW back with an update. CSW awaiting call back.  CSW called [facility] [phone] to speak with Raquel Sarna (pt's Teacher, English as a foreign language). Raquel Sarna is currently out of the office. However, one of her coworkers informed CSW that pt's case is currently being investigated. [facility] is pursuing a 30 day trial for the pt for dialysis.   CSW called Tiffany from [facility] [phone] to inform her of above. No answer, left a message. CSW awaiting call back.  CSW met with pt to inform her of above. Pt expressed her understanding. CSW thanked pt for her continued patience and cooperation. Pt asked CSW to find out where her checks are going. Pt states that they were being sent to her previous group home but does not know where they are being sent now that she does not reside there anymore. CSW stated that she would call pt's guardian and find out where the money is going. Pt also requested nuts as a snack. CSW informed pt's RN.  CSW called Derl Barrow (legal guardian) [phone]. Derl Barrow states that pt's checks are being sent back to social security. CSW will inform pt. Derl Barrow also reports that disability rights continues to look into pt's case. CSW thanked [place] for the update.  CSW will continue to follow pt and assist as needed.  [name], MSW, [place]

## 2016-08-07 NOTE — ED Notes (Signed)
CBG blood glucose 205. Kelly [name] notified about this.

## 2016-08-08 LAB — RENAL FUNCTION PANEL
Albumin: 3.7 g/dL (ref 3.5–5.0)
Anion gap: 12 (ref 5–15)
BUN: 72 mg/dL — AB (ref 6–20)
CHLORIDE: 95 mmol/L — AB (ref 101–111)
CO2: 29 mmol/L (ref 22–32)
Calcium: 9.4 mg/dL (ref 8.9–10.3)
Creatinine, Ser: 9.02 mg/dL — ABNORMAL HIGH (ref 0.44–1.00)
GFR calc Af Amer: 6 mL/min — ABNORMAL LOW (ref >60)
GFR calc non Af Amer: 5 mL/min — ABNORMAL LOW (ref >60)
GLUCOSE: 164 mg/dL — AB (ref 65–99)
POTASSIUM: 5.4 mmol/L — AB (ref 3.5–5.1)
Phosphorus: 4.6 mg/dL (ref 2.5–4.6)
Sodium: 136 mmol/L (ref 135–145)

## 2016-08-08 LAB — CBC
HEMATOCRIT: 28.5 % — AB (ref 35.0–47.0)
Hemoglobin: 9.8 g/dL — ABNORMAL LOW (ref 12.0–16.0)
MCH: 33.1 pg (ref 26.0–34.0)
MCHC: 34.5 g/dL (ref 32.0–36.0)
MCV: 95.9 fL (ref 80.0–100.0)
Platelets: 189 10*3/uL (ref 150–440)
RBC: 2.97 MIL/uL — ABNORMAL LOW (ref 3.80–5.20)
RDW: 14.8 % — AB (ref 11.5–14.5)
WBC: 5.9 10*3/uL (ref 3.6–11.0)

## 2016-08-08 LAB — PHOSPHORUS: PHOSPHORUS: 4.6 mg/dL (ref 2.5–4.6)

## 2016-08-08 NOTE — ED Notes (Signed)
Pt's CBG 166. Glucometer not docking at this time.

## 2016-08-08 NOTE — ED Provider Notes (Signed)
-----------------------------------------   10:38 AM on 08/08/2016 -----------------------------------------   Blood pressure (!) 161/83, pulse (!) 101, temperature 99.1 F (37.3 C), temperature source Oral, resp. rate 19, height 5\' 2"  (1.575 m), weight 176 lb 2.4 oz (79.9 kg), SpO2 100 %.  The patient had no acute events since last update.  Calm and cooperative at this time.  Discussed with TTS, who state social worker continues to look for appropriate placement, but no new updates at this time.     Harvest Dark, MD 08/08/16 1038

## 2016-08-08 NOTE — Progress Notes (Signed)
CSW called Mariel Craft with [facility] [phone] to follow up on pt's dialysis referral. Maudie Mercury states that she has e-mailed her director about pt's case but has still not received a decision. Maudie Mercury states she will contact CSW when she hears back from her Mudlogger.   [name], MSW, [place]

## 2016-08-08 NOTE — Progress Notes (Signed)
Post HD Assessment 

## 2016-08-08 NOTE — ED Notes (Signed)
Pt's CBG 140. Glucometer not docking for pt chart.

## 2016-08-08 NOTE — ED Notes (Signed)
Woke patient up and checked sugar before providing meal tray.

## 2016-08-08 NOTE — ED Notes (Signed)
Pt returned from dialysis. Dialysis reported that pt tolerated well and VS upon completion were 100 HR, 18R, 151/63 BP.

## 2016-08-08 NOTE — Progress Notes (Signed)
Dialysis started 

## 2016-08-08 NOTE — Progress Notes (Signed)
TX ended, pt stable

## 2016-08-08 NOTE — Progress Notes (Signed)
Pre Dialysis 

## 2016-08-08 NOTE — ED Notes (Addendum)
Pt transported by RN to dialysis. All 10am meds held.

## 2016-08-08 NOTE — ED Notes (Signed)
Pt up in the hallway asking for warm blankets. Pt given warm blankets and VS checked. Noted bilateral eye  Puffiness.

## 2016-08-08 NOTE — Progress Notes (Signed)
Subjective:  Patient seen and evaluated during hemodialysis. Tolerating well. Ultrafiltration target 2.5 kg today.     HEMODIALYSIS FLOWSHEET:  Blood Flow Rate (mL/min): 400 mL/min Arterial Pressure (mmHg): -120 mmHg Venous Pressure (mmHg): 200 mmHg Transmembrane Pressure (mmHg): 60 mmHg Ultrafiltration Rate (mL/min): 1000 mL/min Dialysate Flow Rate (mL/min): 600 ml/min Conductivity: Machine : 14 Conductivity: Machine : 14 Dialysis Fluid Bolus: Normal Saline Bolus Amount (mL): 250 mL Dialysate Change: Other (comment) (3K) Intra-Hemodialysis Comments: 445. pt watching tv    Objective:  Vital signs in last 24 hours:  Temp:  [98.5 F (36.9 C)-99.1 F (37.3 C)] 99.1 F (37.3 C) (09/13 1004) Pulse Rate:  [88-102] 101 (09/13 1030) Resp:  [15-19] 19 (09/13 1030) BP: (132-167)/(74-91) 161/83 (09/13 1030) SpO2:  [98 %-100 %] 100 % (09/13 1007) Weight:  [79.9 kg (176 lb 2.4 oz)] 79.9 kg (176 lb 2.4 oz) (09/13 1004)  Weight change:  Filed Weights   08/03/16 1330 08/06/16 0922 08/08/16 1004  Weight: 77.6 kg (171 lb) 81.4 kg (179 lb 7.3 oz) 79.9 kg (176 lb 2.4 oz)    Intake/Output:   No intake or output data in the 24 hours ending 08/08/16 1106   Physical Exam: General: No acute distress, sitting in dialysis chair  HEENT [place]/AT EOMI hearing intact  Neck Supple   Pulm/lungs Normal breathing effort, clear to auscultation   CVS/Heart Regular rhythm, no rub or gallop, Systolic murmur present   Abdomen:  Soft, nontender, nondistended   Extremities: No peripheral edema   Neurologic: Alert, oriented, x 3 follows commands   Skin: No acute rashes   Access: AV fistula        Basic Metabolic Panel:   Recent Labs Lab 08/03/16 0955 08/08/16 1000  NA  --  136  K  --  5.4*  CL  --  95*  CO2  --  29  GLUCOSE  --  164*  BUN  --  72*  CREATININE  --  9.02*  CALCIUM  --  9.4  PHOS 4.7* 4.6  4.6     CBC:  Recent Labs Lab 08/08/16 1000  WBC 5.9  HGB 9.8*  HCT  28.5*  MCV 95.9  PLT 189      Microbiology:  No results found for this or any previous visit (from the past 720 hour(s)).  Coagulation Studies: No results for input(s): LABPROT, INR in the last 72 hours.  Urinalysis: No results for input(s): COLORURINE, LABSPEC, PHURINE, GLUCOSEU, HGBUR, BILIRUBINUR, KETONESUR, PROTEINUR, UROBILINOGEN, NITRITE, LEUKOCYTESUR in the last 72 hours.  Invalid input(s): APPERANCEUR    Imaging: No results found.   Medications:     . acetaminophen  650 mg Oral Once  . amLODipine  10 mg Oral QPM  . aspirin  81 mg Oral Daily  . cholecalciferol  800 Units Oral Daily  . cloNIDine  0.1 mg Oral BID  . divalproex  1,000 mg Oral QHS  . epoetin (EPOGEN/PROCRIT) injection  4,000 Units Intravenous Q M,W,F-HD  . escitalopram  15 mg Oral Daily  . feeding supplement (NEPRO CARB STEADY)  237 mL Oral BID BM  . hydrALAZINE  100 mg Oral TID  . insulin aspart  0-15 Units Subcutaneous TID WC  . insulin aspart  0-5 Units Subcutaneous QHS  . irbesartan  300 mg Oral QHS  . metoprolol tartrate  50 mg Oral BID  . QUEtiapine  200 mg Oral BID  . traZODone  100 mg Oral QHS   acetaminophen, heparin  Assessment/ Plan:  35 y.o. African-American female with developmental delay, ESRD, AOCD, SHPTH, HTN, schizophrenia, mild cognitive impairment, diabetes mellitus type II  [facility] M[facility] [address]  1. hyperkalemia with ESRD on HD MWF : 2 K bath Pt seen during dialysis, tolerating well, UF target 2.5kg.   2. Anemia of CKD:  -continue epogen 4000 units IV with dialysis.  3. SHPTH: not currently on binders. Phosphorus at goal.   4. Hypertension: Blood pressure remains labile.  - irbesartan, metoprolol, hydralazine, clonidine and amlodipine.      LOS: [place], [place] 9/13/201711:06 AM

## 2016-08-08 NOTE — Progress Notes (Signed)
Post HD  

## 2016-08-08 NOTE — ED Notes (Signed)
Pt given dinner tray at this time.  

## 2016-08-08 NOTE — ED Notes (Signed)
Pt CBG 208. Glucometer not docking for pt.

## 2016-08-09 LAB — GLUCOSE, CAPILLARY
GLUCOSE-CAPILLARY: 162 mg/dL — AB (ref 65–99)
GLUCOSE-CAPILLARY: 183 mg/dL — AB (ref 65–99)
GLUCOSE-CAPILLARY: 252 mg/dL — AB (ref 65–99)
GLUCOSE-CAPILLARY: 114 mg/dL — AB (ref 65–99)
GLUCOSE-CAPILLARY: 217 mg/dL — AB (ref 65–99)
GLUCOSE-CAPILLARY: 222 mg/dL — AB (ref 65–99)
Glucose-Capillary: 151 mg/dL — ABNORMAL HIGH (ref 65–99)
Glucose-Capillary: 119 mg/dL — ABNORMAL HIGH (ref 65–99)
Glucose-Capillary: 147 mg/dL — ABNORMAL HIGH (ref 65–99)
Glucose-Capillary: 286 mg/dL — ABNORMAL HIGH (ref 65–99)
Glucose-Capillary: 73 mg/dL (ref 65–99)
Glucose-Capillary: 159 mg/dL — ABNORMAL HIGH (ref 65–99)
Glucose-Capillary: 174 mg/dL — ABNORMAL HIGH (ref 65–99)
Glucose-Capillary: 148 mg/dL — ABNORMAL HIGH (ref 65–99)
Glucose-Capillary: 205 mg/dL — ABNORMAL HIGH (ref 65–99)
Glucose-Capillary: 140 mg/dL — ABNORMAL HIGH (ref 65–99)
Glucose-Capillary: 147 mg/dL — ABNORMAL HIGH (ref 65–99)

## 2016-08-09 MED ORDER — METOPROLOL TARTRATE 50 MG PO TABS
ORAL_TABLET | ORAL | Status: AC
  Administered 2016-08-09: 50 mg via ORAL
  Filled 2016-08-09: qty 1

## 2016-08-09 MED ORDER — QUETIAPINE FUMARATE 200 MG PO TABS
ORAL_TABLET | ORAL | Status: AC
  Administered 2016-08-09: 200 mg via ORAL
  Filled 2016-08-09: qty 1

## 2016-08-09 NOTE — Progress Notes (Signed)
The Clinical Social Work team continues to look for dialysis services for the pt near her new group home in [place], [place]. [place] director is coordinating with pt's [facility] care coordinator Scarlette Shorts [phone] to explore alternative options for the pt. Contacts are currently being made on the pt's behalf to see if [facility] or [facility] would be willing to reconsider the decision of declining the pt from their services. Pt also has an ongoing case with disability rights about this matter as well.   CSW called Mariel Craft [phone] with [facility] to follow up on pt's referral. Maudie Mercury was not in the office at the time. CSW awaiting call back.  CSW will continue to follow pt and assist as needed. Weekend CSW has been notified.  Kelly [name], MSW, [place]

## 2016-08-09 NOTE — ED Notes (Signed)
Pt sleeping. Lunch tray placed at bedside. Pt informed lunch had arrived.

## 2016-08-09 NOTE — ED Notes (Signed)
Pt's blood sugar 166

## 2016-08-09 NOTE — ED Provider Notes (Signed)
-----------------------------------------   6:38 AM on 08/09/2016 -----------------------------------------   Blood pressure (!) 161/78, pulse 100, temperature 98.4 F (36.9 C), temperature source Oral, resp. rate 18, height 5\' 2"  (1.575 m), weight 176 lb 2.4 oz (79.9 kg), SpO2 100 %.  The patient had no acute events since last update.  Calm and cooperative at this time.  Disposition is pending clinical social work team recommendations.     Paulette Blanch, MD 08/09/16 [phone]

## 2016-08-10 LAB — GLUCOSE, CAPILLARY
GLUCOSE-CAPILLARY: 125 mg/dL — AB (ref 65–99)
GLUCOSE-CAPILLARY: 166 mg/dL — AB (ref 65–99)
GLUCOSE-CAPILLARY: 111 mg/dL — AB (ref 65–99)
GLUCOSE-CAPILLARY: 239 mg/dL — AB (ref 65–99)
GLUCOSE-CAPILLARY: 197 mg/dL — AB (ref 65–99)
Glucose-Capillary: 166 mg/dL — ABNORMAL HIGH (ref 65–99)
Glucose-Capillary: 208 mg/dL — ABNORMAL HIGH (ref 65–99)
Glucose-Capillary: 189 mg/dL — ABNORMAL HIGH (ref 65–99)
Glucose-Capillary: 149 mg/dL — ABNORMAL HIGH (ref 65–99)

## 2016-08-10 NOTE — ED Notes (Signed)
BEHAVIORAL HEALTH ROUNDING Patient sleeping: No. Patient alert and oriented: yes Behavior appropriate: Yes.  ; If no, describe:  Nutrition and fluids offered: yes Toileting and hygiene offered: Yes  Sitter present: q15 minute observations and security  monitoring Law enforcement present: Yes  ODS  

## 2016-08-10 NOTE — ED Notes (Signed)
Patient in shower 

## 2016-08-10 NOTE — Progress Notes (Signed)
Post hd tx 

## 2016-08-10 NOTE — ED Notes (Signed)
Report given to [facility] RN in HD   Pt is currently in the shower and I will transport to HD

## 2016-08-10 NOTE — Progress Notes (Signed)
Pre-hd tx 

## 2016-08-10 NOTE — ED Notes (Signed)
meds administered as ordered  Assessment completed

## 2016-08-10 NOTE — Progress Notes (Signed)
Hemodialysis started

## 2016-08-10 NOTE — ED Notes (Signed)

## 2016-08-10 NOTE — Consult Note (Signed)
Psychiatry: Brief note to report that the patient continues to be free of any kind of psychiatric symptoms or problems at all. Not displaying any difficult behavior. Minor bits of oppositionality or easily managed by nursing staff. Healthcare is proceeding appropriately. Patient does not require acute psychiatric intervention. Continues to await appropriate discharge.

## 2016-08-10 NOTE — ED Notes (Signed)
Pt returned from HD.

## 2016-08-10 NOTE — Progress Notes (Signed)
Subjective:  Patient seen and evaluated during hemodialysis. Tolerating well. Ultrafiltration target 2.5 kg today.     HEMODIALYSIS FLOWSHEET:  Blood Flow Rate (mL/min): 400 mL/min Arterial Pressure (mmHg): -150 mmHg Venous Pressure (mmHg): 200 mmHg Transmembrane Pressure (mmHg): 80 mmHg Ultrafiltration Rate (mL/min): 830 mL/min Dialysate Flow Rate (mL/min): 600 ml/min Conductivity: Machine : 14 Conductivity: Machine : 14 Dialysis Fluid Bolus: Normal Saline Bolus Amount (mL): 250 mL Dialysate Change: Other (comment) (3K) Intra-Hemodialysis Comments: 831ml...Dr.Anthany Thornhill rounding.    Objective:  Vital signs in last 24 hours:  Temp:  [98.2 F (36.8 C)-98.4 F (36.9 C)] 98.4 F (36.9 C) (09/15 1015) Pulse Rate:  [92-96] 96 (09/15 1130) Resp:  [16] 16 (09/15 1130) BP: (127-178)/(76-94) 158/82 (09/15 1130) SpO2:  [99 %-100 %] 100 % (09/15 1130)  Weight change:  Filed Weights   08/03/16 1330 08/06/16 0922 08/08/16 1004  Weight: 77.6 kg (171 lb) 81.4 kg (179 lb 7.3 oz) 79.9 kg (176 lb 2.4 oz)    Intake/Output:   No intake or output data in the 24 hours ending 08/10/16 1138   Physical Exam: General: No acute distress, sitting in dialysis chair  HEENT [place]/AT EOMI hearing intact  Neck Supple   Pulm/lungs Normal breathing effort, clear to auscultation   CVS/Heart Regular rhythm, no rub or gallop, Systolic murmur present   Abdomen:  Soft, nontender, nondistended   Extremities: No peripheral edema   Neurologic: Alert, oriented, x 3 follows commands   Skin: No acute rashes   Access: AV fistula        Basic Metabolic Panel:   Recent Labs Lab 08/08/16 1000  NA 136  K 5.4*  CL 95*  CO2 29  GLUCOSE 164*  BUN 72*  CREATININE 9.02*  CALCIUM 9.4  PHOS 4.6  4.6     CBC:  Recent Labs Lab 08/08/16 1000  WBC 5.9  HGB 9.8*  HCT 28.5*  MCV 95.9  PLT 189      Microbiology:  No results found for this or any previous visit (from the past 720  hour(s)).  Coagulation Studies: No results for input(s): LABPROT, INR in the last 72 hours.  Urinalysis: No results for input(s): COLORURINE, LABSPEC, PHURINE, GLUCOSEU, HGBUR, BILIRUBINUR, KETONESUR, PROTEINUR, UROBILINOGEN, NITRITE, LEUKOCYTESUR in the last 72 hours.  Invalid input(s): APPERANCEUR    Imaging: No results found.   Medications:     . acetaminophen  650 mg Oral Once  . amLODipine  10 mg Oral QPM  . aspirin  81 mg Oral Daily  . cholecalciferol  800 Units Oral Daily  . cloNIDine  0.1 mg Oral BID  . divalproex  1,000 mg Oral QHS  . epoetin (EPOGEN/PROCRIT) injection  4,000 Units Intravenous Q M,W,F-HD  . escitalopram  15 mg Oral Daily  . feeding supplement (NEPRO CARB STEADY)  237 mL Oral BID BM  . hydrALAZINE  100 mg Oral TID  . insulin aspart  0-15 Units Subcutaneous TID WC  . insulin aspart  0-5 Units Subcutaneous QHS  . irbesartan  300 mg Oral QHS  . metoprolol tartrate  50 mg Oral BID  . QUEtiapine  200 mg Oral BID  . traZODone  100 mg Oral QHS   acetaminophen, heparin  Assessment/ Plan:  35 y.o. African-American female with developmental delay, ESRD, AOCD, SHPTH, HTN, schizophrenia, mild cognitive impairment, diabetes mellitus type II  [facility] M[facility] [address]  1. hyperkalemia with ESRD on HD MWF : 2 K bath Pt seen during dialysis, tolerating well, UF target  2.5kg.   2. Anemia of CKD:  -continue epogen 4000 units IV with dialysis.  3. SHPTH: not currently on binders. Phosphorus at goal.   4. Hypertension: Blood pressure remains labile.  - irbesartan, metoprolol, hydralazine, clonidine and amlodipine.      LOS: 1 Seldon Barrell 9/15/201711:38 AM

## 2016-08-10 NOTE — ED Notes (Signed)
CBG AT BEDSIDE 239

## 2016-08-10 NOTE — ED Notes (Signed)
BLOOD SUGAR WAS 149 NOTIFIED NURSE AMY T.

## 2016-08-10 NOTE — ED Notes (Signed)
BREAKFAST WAS TO PATIENT

## 2016-08-10 NOTE — ED Provider Notes (Signed)
-----------------------------------------   2:08 PM on 08/10/2016 -----------------------------------------   Blood pressure (!) 159/66, pulse 100, temperature 98.6 F (37 C), temperature source Oral, resp. rate 18, height 5\' 2"  (1.575 m), weight 176 lb 2.4 oz (79.9 kg), SpO2 100 %.  The patient had no acute events since last update.  Offered dialysis today.  Calm and cooperative at this time.  Disposition is pending Psychiatry/Behavioral Medicine team recommendations.     Merlyn Lot, MD 08/10/16 1409

## 2016-08-10 NOTE — ED Notes (Signed)
Patient observed lying in bed with eyes closed  Even, unlabored respirations observed   NAD pt appears to be sleeping  I will continue to monitor along with every 15 minute visual observations and ongoing security monitoring    

## 2016-08-10 NOTE — Progress Notes (Signed)
Hemodialysis tx completed.

## 2016-08-10 NOTE — ED Notes (Signed)
ED BHU [place] Is the patient under IVC or is there intent for IVC:  no   Is the patient medically cleared: Yes.   Is there vacancy in the ED BHU: Yes.   Is the population mix appropriate for patient: Yes.   Is the patient awaiting placement in inpatient or outpatient setting: Yes.   Has the patient had a psychiatric consult: Yes.   Survey of unit performed for contraband, proper placement and condition of furniture, tampering with fixtures in bathroom, shower, and each patient room: Yes.  ; Findings:  APPEARANCE/BEHAVIOR Calm and cooperative NEURO ASSESSMENT Orientation: oriented x3  Denies pain Hallucinations: No.None noted (Hallucinations) Speech: Normal Gait: normal RESPIRATORY ASSESSMENT Even  Unlabored respirations  CARDIOVASCULAR ASSESSMENT Pulses equal   regular rate  Skin warm and dry   GASTROINTESTINAL ASSESSMENT no GI complaint EXTREMITIES Full ROM  PLAN OF CARE Provide calm/safe environment. Vital signs assessed twice daily. ED BHU Assessment once each 12-hour shift. Collaborate with TTS daily or as condition indicates. Assure the ED provider has rounded once each shift. Provide and encourage hygiene. Provide redirection as needed. Assess for escalating behavior; address immediately and inform ED provider.  Assess family dynamic and appropriateness for visitation as needed: Yes.  ; If necessary, describe findings:  Educate the patient/family about BHU procedures/visitation: Yes.  ; If necessary, describe findings:

## 2016-08-11 LAB — GLUCOSE, CAPILLARY
GLUCOSE-CAPILLARY: 137 mg/dL — AB (ref 65–99)
GLUCOSE-CAPILLARY: 178 mg/dL — AB (ref 65–99)
GLUCOSE-CAPILLARY: 177 mg/dL — AB (ref 65–99)
Glucose-Capillary: 179 mg/dL — ABNORMAL HIGH (ref 65–99)

## 2016-08-11 LAB — HEPATITIS B SURFACE ANTIGEN: HEP B S AG: NEGATIVE

## 2016-08-11 NOTE — ED Notes (Signed)
RN attempted to wake patient up to get her to take her medication, patient refuses to take medications at this time

## 2016-08-11 NOTE — ED Notes (Signed)
Meal tray given to patient.

## 2016-08-11 NOTE — ED Notes (Signed)
Patient is voluntary and is pending placement. 

## 2016-08-11 NOTE — ED Notes (Signed)
Voluntary / placement pending

## 2016-08-11 NOTE — ED Notes (Signed)
Patient given meal tray.

## 2016-08-11 NOTE — ED Provider Notes (Signed)
-----------------------------------------   6:30 AM on 08/11/2016 -----------------------------------------   Blood pressure 118/76, pulse 99, temperature 98.4 F (36.9 C), temperature source Oral, resp. rate 16, height 5\' 2"  (1.575 m), weight 171 lb 11.8 oz (77.9 kg), SpO2 99 %.  The patient had no acute events since last update.  Calm and cooperative at this time.  Disposition is pending Psychiatry/Behavioral Medicine team recommendations.     Daymon Larsen, MD 08/11/16 0630

## 2016-08-12 LAB — GLUCOSE, CAPILLARY
GLUCOSE-CAPILLARY: 133 mg/dL — AB (ref 65–99)
GLUCOSE-CAPILLARY: 107 mg/dL — AB (ref 65–99)
Glucose-Capillary: 161 mg/dL — ABNORMAL HIGH (ref 65–99)
Glucose-Capillary: 205 mg/dL — ABNORMAL HIGH (ref 65–99)

## 2016-08-12 NOTE — ED Provider Notes (Signed)
-----------------------------------------   6:46 AM on 08/12/2016 -----------------------------------------   Blood pressure (!) 160/84, pulse 90, temperature 98 F (36.7 C), temperature source Oral, resp. rate 15, height 5\' 2"  (1.575 m), weight 171 lb 11.8 oz (77.9 kg), SpO2 98 %.  The patient had no acute events since last update.  Calm and cooperative at this time.  Disposition is pending Psychiatry/Behavioral Medicine team recommendations.     Daymon Larsen, MD 08/12/16 [phone]

## 2016-08-12 NOTE — ED Notes (Signed)
CBG 107 

## 2016-08-12 NOTE — ED Notes (Signed)
Pt given Nepro shake per request from patient.

## 2016-08-13 LAB — GLUCOSE, CAPILLARY
GLUCOSE-CAPILLARY: 239 mg/dL — AB (ref 65–99)
GLUCOSE-CAPILLARY: 179 mg/dL — AB (ref 65–99)
Glucose-Capillary: 157 mg/dL — ABNORMAL HIGH (ref 65–99)
Glucose-Capillary: 114 mg/dL — ABNORMAL HIGH (ref 65–99)

## 2016-08-13 NOTE — ED Notes (Signed)
Patient refused a bath this A.M. Said she would rather sleep.

## 2016-08-13 NOTE — ED Provider Notes (Signed)
-----------------------------------------   7:33 AM on 08/13/2016 -----------------------------------------   Blood pressure (!) 166/91, pulse 92, temperature 98.1 F (36.7 C), resp. rate 18, height 5\' 2"  (1.575 m), weight 171 lb 11.8 oz (77.9 kg), SpO2 96 %.  The patient had no acute events since last update.  Calm and cooperative at this time.  Disposition is pending per Psychiatry/Behavioral Medicine team recommendations.     Rudene Re, MD 08/13/16 [phone]

## 2016-08-13 NOTE — ED Notes (Signed)
BEHAVIORAL HEALTH ROUNDING Patient sleeping: Yes.   Patient alert and oriented: not applicable SLEEPING Behavior appropriate: Yes.  ; If no, describe: SLEEPING Nutrition and fluids offered: No SLEEPING Toileting and hygiene offered: NoSLEEPING Sitter present: not applicable, Q 15 min safety rounds and observation. Law enforcement present: Yes ODS 

## 2016-08-13 NOTE — ED Notes (Signed)
Pt still in dialysis 

## 2016-08-13 NOTE — Progress Notes (Signed)
Pre hd info 

## 2016-08-13 NOTE — ED Notes (Signed)
Pt given two warm blankets.  

## 2016-08-13 NOTE — Progress Notes (Signed)
Post hd vitals 

## 2016-08-13 NOTE — Progress Notes (Signed)
End of hd tx  

## 2016-08-13 NOTE — ED Notes (Signed)
Pt CBG blood glucose 239. RN notified about this.

## 2016-08-13 NOTE — ED Notes (Signed)
Pt still in Dialysis

## 2016-08-13 NOTE — ED Notes (Signed)
Pt transported to Dialysis via wheelchair escorted er tech

## 2016-08-13 NOTE — ED Notes (Signed)
BEHAVIORAL HEALTH ROUNDING  Patient sleeping: No.  Patient alert and oriented: yes  Behavior appropriate: Yes. ; If no, describe:  Nutrition and fluids offered: Yes  Toileting and hygiene offered: Yes  Sitter present: not applicable, Q 15 min safety rounds and observation.  Law enforcement present: Yes ODS  

## 2016-08-13 NOTE — Progress Notes (Signed)
Start of hd tx

## 2016-08-13 NOTE — Progress Notes (Signed)
Subjective:  Patient seen and evaluated during hemodialysis. Tolerating well. Resting quietly today     HEMODIALYSIS FLOWSHEET:  Blood Flow Rate (mL/min): 200 mL/min Arterial Pressure (mmHg): -150 mmHg Venous Pressure (mmHg): 220 mmHg Transmembrane Pressure (mmHg): 70 mmHg Ultrafiltration Rate (mL/min): 860 mL/min Dialysate Flow Rate (mL/min): 800 ml/min Conductivity: Machine : 14 Conductivity: Machine : 14 Dialysis Fluid Bolus: Normal Saline Bolus Amount (mL): 250 mL (prime) Dialysate Change: 2K Intra-Hemodialysis Comments: 3000, tx d/c, report to rn and ccmd notified     Objective:  Vital signs in last 24 hours:  Temp:  [98.1 F (36.7 C)-99 F (37.2 C)] 98.8 F (37.1 C) (09/18 1359) Pulse Rate:  [86-117] 114 (09/18 1359) Resp:  [13-24] 20 (09/18 1359) BP: (101-166)/(57-91) 126/81 (09/18 1359) SpO2:  [96 %-100 %] 98 % (09/18 1359) Weight:  [78.2 kg (172 lb 6.4 oz)-81.1 kg (178 lb 12.7 oz)] 78.2 kg (172 lb 6.4 oz) (09/18 1312)  Weight change:  Filed Weights   08/10/16 1349 08/13/16 0930 08/13/16 1312  Weight: 77.9 kg (171 lb 11.8 oz) 81.1 kg (178 lb 12.7 oz) 78.2 kg (172 lb 6.4 oz)    Intake/Output:    Intake/Output Summary (Last 24 hours) at 08/13/16 1420 Last data filed at 08/13/16 1312  Gross per 24 hour  Intake                0 ml  Output             2500 ml  Net            -2500 ml     Physical Exam: General: No acute distress, sitting in dialysis chair  HEENT [place]/AT EOMI hearing intact  Neck Supple   Pulm/lungs Normal breathing effort, clear to auscultation   CVS/Heart Regular rhythm, no rub or gallop, Systolic murmur present   Abdomen:  Soft, nontender, nondistended   Extremities: No peripheral edema   Neurologic: Resting quietly today  Skin: No acute rashes   Access: AV fistula        Basic Metabolic Panel:   Recent Labs Lab 08/08/16 1000  NA 136  K 5.4*  CL 95*  CO2 29  GLUCOSE 164*  BUN 72*  CREATININE 9.02*  CALCIUM 9.4   PHOS 4.6  4.6     CBC:  Recent Labs Lab 08/08/16 1000  WBC 5.9  HGB 9.8*  HCT 28.5*  MCV 95.9  PLT 189      Microbiology:  No results found for this or any previous visit (from the past 720 hour(s)).  Coagulation Studies: No results for input(s): LABPROT, INR in the last 72 hours.  Urinalysis: No results for input(s): COLORURINE, LABSPEC, PHURINE, GLUCOSEU, HGBUR, BILIRUBINUR, KETONESUR, PROTEINUR, UROBILINOGEN, NITRITE, LEUKOCYTESUR in the last 72 hours.  Invalid input(s): APPERANCEUR    Imaging: No results found.   Medications:     . acetaminophen  650 mg Oral Once  . amLODipine  10 mg Oral QPM  . aspirin  81 mg Oral Daily  . cholecalciferol  800 Units Oral Daily  . cloNIDine  0.1 mg Oral BID  . divalproex  1,000 mg Oral QHS  . epoetin (EPOGEN/PROCRIT) injection  4,000 Units Intravenous Q M,W,F-HD  . escitalopram  15 mg Oral Daily  . feeding supplement (NEPRO CARB STEADY)  237 mL Oral BID BM  . hydrALAZINE  100 mg Oral TID  . insulin aspart  0-15 Units Subcutaneous TID WC  . insulin aspart  0-5 Units Subcutaneous QHS  .  irbesartan  300 mg Oral QHS  . metoprolol tartrate  50 mg Oral BID  . QUEtiapine  200 mg Oral BID  . traZODone  100 mg Oral QHS   acetaminophen, heparin  Assessment/ Plan:  35 y.o. African-American female with developmental delay, ESRD, AOCD, SHPTH, HTN, schizophrenia, mild cognitive impairment, diabetes mellitus type II  [facility]  1. hyperkalemia with ESRD on HD MWF :   Pt seen during dialysis, tolerating well,    2. Anemia of CKD:  -continue epogen 4000 units IV with dialysis.  3. SHPTH: not currently on binders. Phosphorus at goal.   4. Hypertension: Blood pressure remains labile.  - irbesartan, metoprolol, hydralazine, clonidine and amlodipine.      LOS: 1 Fahima Cifelli 9/18/20172:20 PM

## 2016-08-13 NOTE — ED Notes (Signed)
Pt was taken outside on the patio escorted by the Charge RN for app 20 minutes.

## 2016-08-13 NOTE — ED Notes (Signed)

## 2016-08-13 NOTE — ED Notes (Signed)
Pt returns from dialysis. NAD.

## 2016-08-14 LAB — GLUCOSE, CAPILLARY
GLUCOSE-CAPILLARY: 62 mg/dL — AB (ref 65–99)
Glucose-Capillary: 145 mg/dL — ABNORMAL HIGH (ref 65–99)
Glucose-Capillary: 137 mg/dL — ABNORMAL HIGH (ref 65–99)
Glucose-Capillary: 264 mg/dL — ABNORMAL HIGH (ref 65–99)

## 2016-08-14 NOTE — ED Notes (Signed)
Patient refused shower, but she took a bath in a pan at bedside

## 2016-08-14 NOTE — ED Notes (Signed)
BEHAVIORAL HEALTH ROUNDING Patient sleeping: No. Patient alert and oriented: yes Behavior appropriate: Yes.  ; If no, describe:  Nutrition and fluids offered: Yes  Toileting and hygiene offered: Yes  Sitter present: q 15 min checks Law enforcement present: Yes  

## 2016-08-14 NOTE — ED Provider Notes (Signed)
-----------------------------------------   6:33 AM on 08/14/2016 -----------------------------------------   Blood pressure (!) 149/96, pulse 95, temperature 98.6 F (37 C), temperature source Oral, resp. rate 18, height 5\' 2"  (1.575 m), weight 172 lb 6.4 oz (78.2 kg), SpO2 98 %.  The patient had no acute events since last update.  Calm and cooperative at this time.  The patient remains cleared by psych and continues awaiting acceptance at a placement facility and acceptance at the dialysis facility. The patient did receive dialysis yesterday.     Loney Hering, MD 08/14/16 [phone]

## 2016-08-14 NOTE — ED Notes (Signed)
Breakfast was given to patient. 

## 2016-08-14 NOTE — ED Notes (Signed)
Blood sugar was 145

## 2016-08-14 NOTE — ED Notes (Signed)
BG 137

## 2016-08-14 NOTE — ED Notes (Signed)
Patient refused lunch ,but  Notified  Nurse [employer]

## 2016-08-14 NOTE — Progress Notes (Signed)
CSW consulted with director of clinical social work about pt's case. At this time the social work team will be moving forward with finding a different group home placement for the pt. [facility] is no longer a suitable placement because the dialysis centers in that area are not willing to accept the pt. The social work team, with the help of pt's [facility] Care Coordinator Scarlette Shorts, will be pursuing placement in [facility]. The CSW team is also working with a [place] representative to help secure dialysis placement in this area for the pt as well.   CSW will continue to follow pt and assist as needed.  [name], MSW, [place]

## 2016-08-14 NOTE — ED Notes (Signed)

## 2016-08-14 NOTE — ED Notes (Signed)
Patient refused blood sugar to be checked,notified nurse Larene [place]

## 2016-08-15 LAB — RENAL FUNCTION PANEL
ALBUMIN: 3.6 g/dL (ref 3.5–5.0)
ANION GAP: 12 (ref 5–15)
BUN: 65 mg/dL — ABNORMAL HIGH (ref 6–20)
CALCIUM: 9.7 mg/dL (ref 8.9–10.3)
CO2: 28 mmol/L (ref 22–32)
Chloride: 94 mmol/L — ABNORMAL LOW (ref 101–111)
Creatinine, Ser: 8.98 mg/dL — ABNORMAL HIGH (ref 0.44–1.00)
GFR, EST AFRICAN AMERICAN: 6 mL/min — AB (ref >60)
GFR, EST NON AFRICAN AMERICAN: 5 mL/min — AB (ref >60)
Glucose, Bld: 173 mg/dL — ABNORMAL HIGH (ref 65–99)
PHOSPHORUS: 5.3 mg/dL — AB (ref 2.5–4.6)
POTASSIUM: 6.1 mmol/L — AB (ref 3.5–5.1)
SODIUM: 134 mmol/L — AB (ref 135–145)

## 2016-08-15 LAB — CBC
HEMATOCRIT: 27.7 % — AB (ref 35.0–47.0)
HEMOGLOBIN: 9.5 g/dL — AB (ref 12.0–16.0)
MCH: 32.8 pg (ref 26.0–34.0)
MCHC: 34.2 g/dL (ref 32.0–36.0)
MCV: 95.9 fL (ref 80.0–100.0)
Platelets: 175 10*3/uL (ref 150–440)
RBC: 2.89 MIL/uL — AB (ref 3.80–5.20)
RDW: 14.5 % (ref 11.5–14.5)
WBC: 4.7 10*3/uL (ref 3.6–11.0)

## 2016-08-15 LAB — GLUCOSE, CAPILLARY
GLUCOSE-CAPILLARY: 116 mg/dL — AB (ref 65–99)
GLUCOSE-CAPILLARY: 195 mg/dL — AB (ref 65–99)
GLUCOSE-CAPILLARY: 127 mg/dL — AB (ref 65–99)

## 2016-08-15 NOTE — ED Notes (Signed)
Pt refusing CBG check at this time.

## 2016-08-15 NOTE — Progress Notes (Signed)
Subjective:  Patient seen and evaluated during hemodialysis. Tolerating well. Resting quietly today     HEMODIALYSIS FLOWSHEET:  Blood Flow Rate (mL/min): 400 mL/min Arterial Pressure (mmHg): -180 mmHg Venous Pressure (mmHg): 210 mmHg Transmembrane Pressure (mmHg): 60 mmHg Ultrafiltration Rate (mL/min): 860 mL/min Dialysate Flow Rate (mL/min): 800 ml/min Conductivity: Machine : 13.5 Conductivity: Machine : 13.5 Dialysis Fluid Bolus: Normal Saline Bolus Amount (mL): 250 mL Dialysate Change: 2K Intra-Hemodialysis Comments: 3000. Tx complete     Objective:  Vital signs in last 24 hours:  Temp:  [98 F (36.7 C)-98.6 F (37 C)] 98 F (36.7 C) (09/20 1330) Pulse Rate:  [88-126] 126 (09/20 1330) Resp:  [14-25] 22 (09/20 1330) BP: (137-171)/(67-104) 139/85 (09/20 1330) SpO2:  [95 %-100 %] 100 % (09/20 1330) Weight:  [76.7 kg (169 lb 1.5 oz)-79.3 kg (174 lb 12.8 oz)] 76.7 kg (169 lb 1.5 oz) (09/20 1257)  Weight change:  Filed Weights   08/13/16 1312 08/15/16 0907 08/15/16 1257  Weight: 78.2 kg (172 lb 6.4 oz) 79.3 kg (174 lb 12.8 oz) 76.7 kg (169 lb 1.5 oz)    Intake/Output:    Intake/Output Summary (Last 24 hours) at 08/15/16 1436 Last data filed at 08/15/16 1250  Gross per 24 hour  Intake                0 ml  Output             2500 ml  Net            -2500 ml     Physical Exam: General: No acute distress, sitting in dialysis chair  HEENT [place]/AT hearing intact  Neck Supple   Pulm/lungs Normal breathing effort, clear to auscultation   CVS/Heart Regular rhythm, no rub or gallop, Systolic murmur present , tachycardic  Abdomen:  Soft, nontender, nondistended   Extremities: No peripheral edema   Neurologic: Resting quietly today  Skin: No acute rashes   Access: AV fistula        Basic Metabolic Panel:   Recent Labs Lab 08/15/16 0900  NA 134*  K 6.1*  CL 94*  CO2 28  GLUCOSE 173*  BUN 65*  CREATININE 8.98*  CALCIUM 9.7  PHOS 5.3*      CBC:  Recent Labs Lab 08/15/16 0900  WBC 4.7  HGB 9.5*  HCT 27.7*  MCV 95.9  PLT 175      Microbiology:  No results found for this or any previous visit (from the past 720 hour(s)).  Coagulation Studies: No results for input(s): LABPROT, INR in the last 72 hours.  Urinalysis: No results for input(s): COLORURINE, LABSPEC, PHURINE, GLUCOSEU, HGBUR, BILIRUBINUR, KETONESUR, PROTEINUR, UROBILINOGEN, NITRITE, LEUKOCYTESUR in the last 72 hours.  Invalid input(s): APPERANCEUR    Imaging: No results found.   Medications:     . acetaminophen  650 mg Oral Once  . amLODipine  10 mg Oral QPM  . aspirin  81 mg Oral Daily  . cholecalciferol  800 Units Oral Daily  . cloNIDine  0.1 mg Oral BID  . divalproex  1,000 mg Oral QHS  . epoetin (EPOGEN/PROCRIT) injection  4,000 Units Intravenous Q M,W,F-HD  . escitalopram  15 mg Oral Daily  . feeding supplement (NEPRO CARB STEADY)  237 mL Oral BID BM  . hydrALAZINE  100 mg Oral TID  . insulin aspart  0-15 Units Subcutaneous TID WC  . insulin aspart  0-5 Units Subcutaneous QHS  . irbesartan  300 mg Oral QHS  . metoprolol  tartrate  50 mg Oral BID  . QUEtiapine  200 mg Oral BID  . traZODone  100 mg Oral QHS   acetaminophen, heparin  Assessment/ Plan:  35 y.o. African-American female with developmental delay, ESRD, AOCD, SHPTH, HTN, schizophrenia, mild cognitive impairment, diabetes mellitus type II  [facility] M[facility] [address]  1. hyperkalemia with ESRD on HD MWF :   Pt seen during dialysis, tolerating well,    2. Anemia of CKD:  -continue epogen 4000 units IV with dialysis.  3. SHPTH: not currently on binders. Phosphorus at goal.   4. Hypertension: Blood pressure remains labile.  - irbesartan, metoprolol, hydralazine, clonidine and amlodipine.      LOS: 1 Kelly [name] 9/20/20172:36 PM

## 2016-08-15 NOTE — ED Provider Notes (Signed)
-----------------------------------------   6:25 AM on 08/15/2016 -----------------------------------------   Blood pressure (!) 155/67, pulse 89, temperature 98.5 F (36.9 C), temperature source Oral, resp. rate 17, height 5\' 2"  (1.575 m), weight 172 lb 6.4 oz (78.2 kg), SpO2 98 %.  The patient had no acute events since last update.  Calm and cooperative at this time.  Disposition is pending Psychiatry/Behavioral Medicine team recommendations.  Patient should receive dialysis today as scheduled.     Joanne Gavel, MD 08/15/16 [phone]

## 2016-08-15 NOTE — Progress Notes (Signed)
Pre Dialysis 

## 2016-08-15 NOTE — Progress Notes (Signed)
Post dialysis 

## 2016-08-15 NOTE — Progress Notes (Signed)
CSW received phone call from [facility] (pt's Care Coordinator) [phone]. Tsosie Billing states that pt is currently being considered for placement at two different family care homes. Nevada Crane from [facility] is interested in visiting with the pt to assess her for appropriateness. Tsosie Billing has given Mr. Minden CSW's contact information and CSW is awaiting his call.   In addition to the two potential placements, pt is also being reviewed by her previous group [facility]. [facility] may be willing to accept pt back. [facility] director) is reviewing case with her staff and will get back to [place] with final decision.   CSW will continue to follow pt and assist as needed.  [name], MSW, [place]

## 2016-08-15 NOTE — ED Notes (Signed)
Pt given breakfast tray and spoon for cereal.

## 2016-08-15 NOTE — ED Notes (Signed)
Pt currently awake attempting to sleep. Nothing needed of staff at this time. Pt. environment safe

## 2016-08-15 NOTE — ED Notes (Signed)
Pt outside with ED Tech.

## 2016-08-15 NOTE — ED Notes (Signed)
Pt currently sleeping. Nothing needed by staff at this time

## 2016-08-15 NOTE — ED Notes (Signed)
Pt given meal tray.

## 2016-08-15 NOTE — ED Notes (Signed)
Report from [place], [place]

## 2016-08-15 NOTE — ED Notes (Signed)
Pt back in room at this time.

## 2016-08-15 NOTE — ED Notes (Signed)
Pt in dialysis

## 2016-08-15 NOTE — ED Notes (Signed)
Pt still in dialysis 

## 2016-08-15 NOTE — Progress Notes (Signed)
Dialysis started 

## 2016-08-15 NOTE — ED Notes (Signed)
Pt taken by tech downstairs to dialysis via wheelchair

## 2016-08-15 NOTE — ED Notes (Signed)
Pt back from Dialysis.

## 2016-08-15 NOTE — Progress Notes (Signed)
Dialysis complete

## 2016-08-16 LAB — GLUCOSE, CAPILLARY
GLUCOSE-CAPILLARY: 238 mg/dL — AB (ref 65–99)
GLUCOSE-CAPILLARY: 257 mg/dL — AB (ref 65–99)
GLUCOSE-CAPILLARY: 146 mg/dL — AB (ref 65–99)
GLUCOSE-CAPILLARY: 237 mg/dL — AB (ref 65–99)
Glucose-Capillary: 75 mg/dL (ref 65–99)

## 2016-08-16 NOTE — Progress Notes (Signed)
Kelly [name] from [facility] located in [place], [place] came to visit the pt. CSW was present at the time of the interaction. [name] asked pt several questions about herself and gave the pt an opportunity to ask any questions that she may have as well. Pt stated "I want to come to your group home". During the interaction pt was very animated and pleasant. [name] will review pt's case with her husband and get back in touch with CSW with a final decision. Weekend CSW notified and will follow up.   [name]'s contact # [phone] or [phone] (cell)  [name], MSW, [place]

## 2016-08-16 NOTE — ED Provider Notes (Signed)
-----------------------------------------   4:43 AM on 08/16/2016 -----------------------------------------   BP 139/85 (BP Location: Right Arm)   Pulse (!) 126   Temp 98 F (36.7 C) (Oral)   Resp (!) 22   Ht 5\' 2"  (1.575 m)   Wt 169 lb 1.5 oz (76.7 kg)   SpO2 100%   BMI 30.93 kg/m   No acute events overnight.  Acting appropriately.  Awaiting placement.     Nance Pear, MD 08/16/16 [phone]

## 2016-08-16 NOTE — ED Notes (Signed)
Pt sleeping; easily roused. Still has not gotten up to eat today.

## 2016-08-16 NOTE — Progress Notes (Signed)
CSW received call from Ms. Wallington [phone] from [facility] in [place]. CSW spoke with her about pt referral and sent additional information that was requested. [name] states she may be interested in visiting the pt around 5 or 5:30pm tonight. CSW will be present for meeting.   CSW called back to confirm visit. No answer, left a message.  [name], MSW, [place]

## 2016-08-16 NOTE — ED Notes (Signed)
Awakened pt to make sure she was rousable, as she has been sleeping since this nurse arrived at [facility], awakening only for CBG and vital signs. Pt awakened and drank 1/2 of her supplement shake, spoke to this nurse coherently, and went back to sleep. NAD noted.

## 2016-08-17 LAB — GLUCOSE, CAPILLARY
GLUCOSE-CAPILLARY: 150 mg/dL — AB (ref 65–99)
GLUCOSE-CAPILLARY: 233 mg/dL — AB (ref 65–99)
GLUCOSE-CAPILLARY: 254 mg/dL — AB (ref 65–99)
GLUCOSE-CAPILLARY: 146 mg/dL — AB (ref 65–99)
GLUCOSE-CAPILLARY: 212 mg/dL — AB (ref 65–99)
GLUCOSE-CAPILLARY: 151 mg/dL — AB (ref 65–99)
GLUCOSE-CAPILLARY: 182 mg/dL — AB (ref 65–99)
GLUCOSE-CAPILLARY: 290 mg/dL — AB (ref 65–99)
GLUCOSE-CAPILLARY: 160 mg/dL — AB (ref 65–99)
GLUCOSE-CAPILLARY: 131 mg/dL — AB (ref 65–99)
GLUCOSE-CAPILLARY: 214 mg/dL — AB (ref 65–99)
GLUCOSE-CAPILLARY: 285 mg/dL — AB (ref 65–99)
GLUCOSE-CAPILLARY: 133 mg/dL — AB (ref 65–99)
GLUCOSE-CAPILLARY: 180 mg/dL — AB (ref 65–99)
GLUCOSE-CAPILLARY: 211 mg/dL — AB (ref 65–99)
GLUCOSE-CAPILLARY: 131 mg/dL — AB (ref 65–99)
Glucose-Capillary: 330 mg/dL — ABNORMAL HIGH (ref 65–99)
Glucose-Capillary: 317 mg/dL — ABNORMAL HIGH (ref 65–99)
Glucose-Capillary: 220 mg/dL — ABNORMAL HIGH (ref 65–99)
Glucose-Capillary: 235 mg/dL — ABNORMAL HIGH (ref 65–99)
Glucose-Capillary: 225 mg/dL — ABNORMAL HIGH (ref 65–99)
Glucose-Capillary: 178 mg/dL — ABNORMAL HIGH (ref 65–99)
Glucose-Capillary: 261 mg/dL — ABNORMAL HIGH (ref 65–99)
Glucose-Capillary: 148 mg/dL — ABNORMAL HIGH (ref 65–99)
Glucose-Capillary: 212 mg/dL — ABNORMAL HIGH (ref 65–99)
Glucose-Capillary: 124 mg/dL — ABNORMAL HIGH (ref 65–99)
Glucose-Capillary: 306 mg/dL — ABNORMAL HIGH (ref 65–99)
Glucose-Capillary: 282 mg/dL — ABNORMAL HIGH (ref 65–99)
Glucose-Capillary: 188 mg/dL — ABNORMAL HIGH (ref 65–99)
Glucose-Capillary: 203 mg/dL — ABNORMAL HIGH (ref 65–99)
Glucose-Capillary: 203 mg/dL — ABNORMAL HIGH (ref 65–99)
Glucose-Capillary: 71 mg/dL (ref 65–99)
Glucose-Capillary: 190 mg/dL — ABNORMAL HIGH (ref 65–99)
Glucose-Capillary: 166 mg/dL — ABNORMAL HIGH (ref 65–99)
Glucose-Capillary: 132 mg/dL — ABNORMAL HIGH (ref 65–99)
Glucose-Capillary: 275 mg/dL — ABNORMAL HIGH (ref 65–99)
Glucose-Capillary: 154 mg/dL — ABNORMAL HIGH (ref 65–99)

## 2016-08-17 LAB — RENAL FUNCTION PANEL
Albumin: 3.5 g/dL (ref 3.5–5.0)
Anion gap: 9 (ref 5–15)
BUN: 19 mg/dL (ref 6–20)
CALCIUM: 9.2 mg/dL (ref 8.9–10.3)
CHLORIDE: 99 mmol/L — AB (ref 101–111)
CO2: 31 mmol/L (ref 22–32)
CREATININE: 3.34 mg/dL — AB (ref 0.44–1.00)
GFR, EST AFRICAN AMERICAN: 20 mL/min — AB (ref >60)
GFR, EST NON AFRICAN AMERICAN: 17 mL/min — AB (ref >60)
Glucose, Bld: 140 mg/dL — ABNORMAL HIGH (ref 65–99)
Phosphorus: 1.6 mg/dL — ABNORMAL LOW (ref 2.5–4.6)
Potassium: 3.5 mmol/L (ref 3.5–5.1)
SODIUM: 139 mmol/L (ref 135–145)

## 2016-08-17 LAB — CBC
HCT: 29 % — ABNORMAL LOW (ref 35.0–47.0)
Hemoglobin: 9.8 g/dL — ABNORMAL LOW (ref 12.0–16.0)
MCH: 32.5 pg (ref 26.0–34.0)
MCHC: 33.7 g/dL (ref 32.0–36.0)
MCV: 96.6 fL (ref 80.0–100.0)
PLATELETS: 184 10*3/uL (ref 150–440)
RBC: 3 MIL/uL — AB (ref 3.80–5.20)
RDW: 14.2 % (ref 11.5–14.5)
WBC: 4.4 10*3/uL (ref 3.6–11.0)

## 2016-08-17 NOTE — ED Notes (Signed)
Pt taken outside for fresh air. Pt cooperative and calm.

## 2016-08-17 NOTE — ED Notes (Signed)
Offered patient a shower and she said that she wanted to go back to sleep first, that she would let me know about a shower when she wakes.

## 2016-08-17 NOTE — ED Notes (Signed)
Patient woke and requested cereal and water..per orders Doctor  [place] ok to give to patient. Patient very happy

## 2016-08-17 NOTE — ED Notes (Signed)
Pt given meal

## 2016-08-17 NOTE — ED Notes (Signed)
Pt transported to dialysis

## 2016-08-17 NOTE — ED Notes (Signed)
Patient provided cereal and milk and 3 fresh warm blankets. Also provided patient with some clean socks

## 2016-08-17 NOTE — ED Notes (Signed)
Pt in shower. Given clean clothes.

## 2016-08-17 NOTE — Progress Notes (Signed)
Pre dialysis assessment 

## 2016-08-17 NOTE — Progress Notes (Signed)
HD COMPLETED  

## 2016-08-17 NOTE — ED Notes (Signed)
Attempted to take pts blood pressure, pt uncooperative at this time. BP medication held.

## 2016-08-17 NOTE — Progress Notes (Signed)
LCSW met with patient and checked in with her. She reported that her dialysis session was good and she got through it. LCSW asked her what she thought about Mrs [place]. Patient reported she wants to live there. LCSW explained she would be staying this weekend and perhaps sometime.... Next week if her Care Corridantor Ms Hunt makes the arrangements it might be possible. She reported she is happy and ate well.    LCSW [phone] 

## 2016-08-17 NOTE — Progress Notes (Signed)
HD STARTED  

## 2016-08-17 NOTE — Progress Notes (Signed)
LCSW called Kelly Oswaldo Milian and was unable to leave a message will try at 9am to find out if this could be a suitable housing arrangement for Kelly [name].   [employer] LCSW [phone]

## 2016-08-17 NOTE — Progress Notes (Signed)
HD COMPLETE

## 2016-08-17 NOTE — ED Provider Notes (Signed)
Vitals:   08/16/16 2111 08/17/16 0608  BP: (!) 160/88 (!) 145/87  Pulse: 87 100  Resp: 16 16  Temp:  98.1 F (36.7 C)   Patient remains medically stable for psychiatric disposition and placement.   Earleen Newport, MD 08/17/16 [phone]

## 2016-08-17 NOTE — Progress Notes (Signed)
Subjective:  Patient seen and evaluated during hemodialysis. Tolerating well. Asking for fries   HEMODIALYSIS FLOWSHEET:  Blood Flow Rate (mL/min): 400 mL/min Arterial Pressure (mmHg): -170 mmHg Venous Pressure (mmHg): 190 mmHg Transmembrane Pressure (mmHg): 50 mmHg Ultrafiltration Rate (mL/min): 710 mL/min Dialysate Flow Rate (mL/min): 800 ml/min Conductivity: Machine : 14 Conductivity: Machine : 14 Dialysis Fluid Bolus: Normal Saline Bolus Amount (mL): 250 mL Dialysate Change: 2K Intra-Hemodialysis Comments: HD COMPLETED (TOTAL UF 2500ML)     Objective:  Vital signs in last 24 hours:  Temp:  [98.1 F (36.7 C)-98.5 F (36.9 C)] 98.5 F (36.9 C) (09/22 1330) Pulse Rate:  [87-111] 105 (09/22 1330) Resp:  [16-18] 18 (09/22 1330) BP: (118-169)/(67-118) 128/68 (09/22 1330) SpO2:  [100 %] 100 % (09/22 1330) Weight:  [77 kg (169 lb 12.8 oz)-79.5 kg (175 lb 3.2 oz)] 77 kg (169 lb 12.8 oz) (09/22 1330)  Weight change:  Filed Weights   08/15/16 1257 08/17/16 0946 08/17/16 1330  Weight: 76.7 kg (169 lb 1.5 oz) 79.5 kg (175 lb 3.2 oz) 77 kg (169 lb 12.8 oz)    Intake/Output:    Intake/Output Summary (Last 24 hours) at 08/17/16 1412 Last data filed at 08/17/16 1330  Gross per 24 hour  Intake                0 ml  Output             2000 ml  Net            -2000 ml     Physical Exam: General: No acute distress, sitting in dialysis chair  HEENT /AT hearing intact  Neck Supple   Pulm/lungs Normal breathing effort, clear to auscultation   CVS/Heart Regular rhythm, no rub or gallop, Systolic murmur present , tachycardic  Abdomen:  Soft, nontender, nondistended   Extremities: No peripheral edema   Neurologic: Resting quietly today  Skin: No acute rashes   Access: AV fistula        Basic Metabolic Panel:   Recent Labs Lab 08/15/16 0900 08/17/16 1135  NA 134* 139  K 6.1* 3.5  CL 94* 99*  CO2 28 31  GLUCOSE 173* 140*  BUN 65* 19  CREATININE 8.98* 3.34*   CALCIUM 9.7 9.2  PHOS 5.3* 1.6*     CBC:  Recent Labs Lab 08/15/16 0900 08/17/16 1135  WBC 4.7 4.4  HGB 9.5* 9.8*  HCT 27.7* 29.0*  MCV 95.9 96.6  PLT 175 184      Microbiology:  No results found for this or any previous visit (from the past 720 hour(s)).  Coagulation Studies: No results for input(s): LABPROT, INR in the last 72 hours.  Urinalysis: No results for input(s): COLORURINE, LABSPEC, PHURINE, GLUCOSEU, HGBUR, BILIRUBINUR, KETONESUR, PROTEINUR, UROBILINOGEN, NITRITE, LEUKOCYTESUR in the last 72 hours.  Invalid input(s): APPERANCEUR    Imaging: No results found.   Medications:     . acetaminophen  650 mg Oral Once  . amLODipine  10 mg Oral QPM  . aspirin  81 mg Oral Daily  . cholecalciferol  800 Units Oral Daily  . cloNIDine  0.1 mg Oral BID  . divalproex  1,000 mg Oral QHS  . epoetin (EPOGEN/PROCRIT) injection  4,000 Units Intravenous Q M,W,F-HD  . escitalopram  15 mg Oral Daily  . feeding supplement (NEPRO CARB STEADY)  237 mL Oral BID BM  . hydrALAZINE  100 mg Oral TID  . insulin aspart  0-15 Units Subcutaneous TID WC  . insulin  aspart  0-5 Units Subcutaneous QHS  . irbesartan  300 mg Oral QHS  . metoprolol tartrate  50 mg Oral BID  . QUEtiapine  200 mg Oral BID  . traZODone  100 mg Oral QHS   acetaminophen, heparin  Assessment/ Plan:  35 y.o. African-American female with developmental delay, ESRD, AOCD, SHPTH, HTN, schizophrenia, mild cognitive impairment, diabetes mellitus type II  [facility] M[facility] [address]  1. hyperkalemia with ESRD on HD MWF:   Pt seen during dialysis, tolerating well,    2. Anemia of CKD:  -continue epogen 4000 units IV with dialysis.  3. SHPTH: not currently on binders. Phosphorus   4. Hypertension: Blood pressure better controlled lately  - Amlodipine, clonidine, hydralazine, irbesartan, metoprolol     LOS: 1 Joscelyne Renville 9/22/20172:12 PM

## 2016-08-17 NOTE — Progress Notes (Signed)
LCSW called and spoke to Ms Kelly [name] [phone]  Group Home [address] they are awaiting approval and a contract from [employer] [facility] [place].   LCSW called and left Rich Fuchs of [facility] [phone] another message. Awaiting a call back.   [employer] LCSW [phone]

## 2016-08-18 LAB — GLUCOSE, CAPILLARY
GLUCOSE-CAPILLARY: 120 mg/dL — AB (ref 65–99)
GLUCOSE-CAPILLARY: 206 mg/dL — AB (ref 65–99)
GLUCOSE-CAPILLARY: 188 mg/dL — AB (ref 65–99)
GLUCOSE-CAPILLARY: 207 mg/dL — AB (ref 65–99)

## 2016-08-18 NOTE — ED Notes (Signed)
Pt sleeping. 

## 2016-08-18 NOTE — ED Notes (Signed)
BEHAVIORAL HEALTH ROUNDING  Patient sleeping: No.  Patient alert and oriented: yes  Behavior appropriate: Yes. ; If no, describe:  Nutrition and fluids offered: Yes  Toileting and hygiene offered: Yes  Sitter present: not applicable, Q 15 min safety rounds and observation.  Law enforcement present: Yes ODS  

## 2016-08-18 NOTE — ED Notes (Signed)
Pt refused to allow tech to take vital signs.

## 2016-08-18 NOTE — ED Notes (Signed)
ENVIRONMENTAL ASSESSMENT  Potentially harmful objects out of patient reach: Yes.  Personal belongings secured: Yes.  Patient dressed in hospital provided attire only: Yes.  Plastic bags out of patient reach: Yes.  Patient care equipment (cords, cables, call bells, lines, and drains) shortened, removed, or accounted for: Yes.  Equipment and supplies removed from bottom of stretcher: Yes.  Potentially toxic materials out of patient reach: Yes.  Sharps container removed or out of patient reach: Yes.   BEHAVIORAL HEALTH ROUNDING  Patient sleeping: No.  Patient alert and oriented: yes  Behavior appropriate: Yes. ; If no, describe:  Nutrition and fluids offered: Yes  Toileting and hygiene offered: Yes  Sitter present: not applicable, Q 15 min safety rounds and observation.  Law enforcement present: Yes ODS  APPEARANCE/BEHAVIOR  calm, cooperative and adequate rapport can be established  NEURO ASSESSMENT  Orientation: time, place and person  Hallucinations: No.None noted (Hallucinations)  Speech: Normal  Gait: normal  RESPIRATORY ASSESSMENT  WNL  CARDIOVASCULAR ASSESSMENT  WNL  GASTROINTESTINAL ASSESSMENT  WNL  EXTREMITIES  ROM of all joints is normal  PLAN OF CARE  Provide calm/safe environment. Vital signs assessed twice daily. ED BHU Assessment once each 12-hour shift. Collaborate with TTS daily or as condition indicates. Assure the ED provider has rounded once each shift. Provide and encourage hygiene. Provide redirection as needed. Assess for escalating behavior; address immediately and inform ED provider.  Assess family dynamic and appropriateness for visitation as needed: Yes. ; If necessary, describe findings:  Educate the patient/family about BHU procedures/visitation: Yes. ; If necessary, describe findings:

## 2016-08-18 NOTE — ED Notes (Signed)
Pt given lunch tray.

## 2016-08-18 NOTE — ED Provider Notes (Signed)
-----------------------------------------   6:20 AM on 08/18/2016 -----------------------------------------   Blood pressure 130/85, pulse (!) 111, temperature 98.6 F (37 C), temperature source Oral, resp. rate 18, height 5\' 2"  (1.575 m), weight 169 lb 12.8 oz (77 kg), SpO2 100 %.  The patient had no acute events since last update.  Calm and cooperative at this time.  Disposition is pending CSW team recommendations.     Paulette Blanch, MD 08/18/16 [phone]

## 2016-08-19 LAB — GLUCOSE, CAPILLARY
GLUCOSE-CAPILLARY: 125 mg/dL — AB (ref 65–99)
GLUCOSE-CAPILLARY: 187 mg/dL — AB (ref 65–99)
Glucose-Capillary: 90 mg/dL (ref 65–99)
Glucose-Capillary: 263 mg/dL — ABNORMAL HIGH (ref 65–99)

## 2016-08-19 NOTE — ED Notes (Signed)
BEHAVIORAL HEALTH ROUNDING Patient sleeping: Yes.   Patient alert and oriented: not applicable SLEEPING Behavior appropriate: Yes.  ; If no, describe: SLEEPING Nutrition and fluids offered: No SLEEPING Toileting and hygiene offered: NoSLEEPING Sitter present: not applicable, Q 15 min safety rounds and observation. Law enforcement present: Yes ODS 

## 2016-08-19 NOTE — ED Notes (Signed)
Patient given meal tray.

## 2016-08-19 NOTE — ED Notes (Signed)
Pt with bruit and thrill noted to left upper arm dialysis access. Dressing present without s/s of infection or bleeding noted to dialysis shunt. resps unlabored, breath sounds clear. Trace non pitting facial edema noted. Pt ambulatory without difficulty, denies nausea, vomiting, diarrhea. Pt denies pain. s1 and s2 auscultated. 2+ radial pulses noted.

## 2016-08-19 NOTE — ED Notes (Signed)
Pt denies further needs other than hs medications. Pt watching tv in no acute distress.

## 2016-08-19 NOTE — ED Provider Notes (Signed)
-----------------------------------------   6:23 AM on 08/19/2016 -----------------------------------------   Blood pressure 139/87, pulse 98, temperature 98.3 F (36.8 C), temperature source Oral, resp. rate 19, height 5\' 2"  (1.575 m), weight 169 lb 12.8 oz (77 kg), SpO2 100 %.  The patient had no acute events since last update.  Calm and cooperative at this time.  Disposition is pending CSW team recommendations.     Paulette Blanch, MD 08/19/16 [phone]

## 2016-08-19 NOTE — ED Notes (Signed)
Patient sleeping, respirations equal and unlabored at this time

## 2016-08-20 LAB — RENAL FUNCTION PANEL
ANION GAP: 13 (ref 5–15)
Albumin: 3.5 g/dL (ref 3.5–5.0)
BUN: 72 mg/dL — ABNORMAL HIGH (ref 6–20)
CALCIUM: 9.6 mg/dL (ref 8.9–10.3)
CHLORIDE: 94 mmol/L — AB (ref 101–111)
CO2: 27 mmol/L (ref 22–32)
Creatinine, Ser: 9.31 mg/dL — ABNORMAL HIGH (ref 0.44–1.00)
GFR, EST AFRICAN AMERICAN: 6 mL/min — AB (ref >60)
GFR, EST NON AFRICAN AMERICAN: 5 mL/min — AB (ref >60)
Glucose, Bld: 114 mg/dL — ABNORMAL HIGH (ref 65–99)
PHOSPHORUS: 4.9 mg/dL — AB (ref 2.5–4.6)
POTASSIUM: 6.6 mmol/L — AB (ref 3.5–5.1)
Sodium: 134 mmol/L — ABNORMAL LOW (ref 135–145)

## 2016-08-20 LAB — GLUCOSE, CAPILLARY
GLUCOSE-CAPILLARY: 221 mg/dL — AB (ref 65–99)
GLUCOSE-CAPILLARY: 158 mg/dL — AB (ref 65–99)
Glucose-Capillary: 83 mg/dL (ref 65–99)
Glucose-Capillary: 179 mg/dL — ABNORMAL HIGH (ref 65–99)

## 2016-08-20 LAB — CBC
HEMATOCRIT: 29.1 % — AB (ref 35.0–47.0)
HEMOGLOBIN: 10.1 g/dL — AB (ref 12.0–16.0)
MCH: 33 pg (ref 26.0–34.0)
MCHC: 34.6 g/dL (ref 32.0–36.0)
MCV: 95.2 fL (ref 80.0–100.0)
Platelets: 183 10*3/uL (ref 150–440)
RBC: 3.06 MIL/uL — AB (ref 3.80–5.20)
RDW: 14.5 % (ref 11.5–14.5)
WBC: 4.2 10*3/uL (ref 3.6–11.0)

## 2016-08-20 NOTE — Progress Notes (Signed)
Pre Dialysis 

## 2016-08-20 NOTE — Progress Notes (Signed)
Dialysis started 

## 2016-08-20 NOTE — Progress Notes (Signed)
End of hd tx  

## 2016-08-20 NOTE — ED Notes (Signed)
Pt was provided with 21mL of fluid po by this rn from 1900-0000.

## 2016-08-20 NOTE — ED Notes (Signed)
Lab called about pre dialysis draw of blood. Potassium 6.6. MD and dialysis made aware

## 2016-08-20 NOTE — Progress Notes (Signed)
CSW called Scarlette Shorts Ms [facility] Coordinator) [phone]. Ms. Geoffry Paradise states that [facility] group [facility] in [place], [place] may be interested in the pt. CSW informed Tsosie Billing that Ms. Oswaldo Milian is also still considering the pt for her group home and will contact [facility] to try to draft a contract.   CSW called Ms. Watlington [phone]. Ms. Watlington states that she is still interested in accepting pt to her group home however, there is a lot of paperwork that needs to be done on her end. CSW informed Ms. Watlington that finding dialysis in the are may difficult because the social work team has not had any luck with connecting pt to a dialysis center in [place] or [place]. Ms. Oswaldo Milian states that she would be agreeable to transporting pt to her previous dialysis center in [place] [facility]) if it comes to that.   CSW director is checking with [facility] rep to see if [facility] is in the [place] region that she has influence in. If [facility] is in [place] rep's region then that [facility] will be given preference over [facility]. If not, CSW will move forward with [facility] and pt will likely continue to receive dialysis at [facility].  CSW will continue to follow pt and assist as needed.  [name], MSW, [place]

## 2016-08-20 NOTE — ED Notes (Signed)
Pt transferred to dialysis.

## 2016-08-20 NOTE — Progress Notes (Signed)
Subjective:  Pt seen during dialysis. K high at 6.6.  Dietary indiscretion noted. On 1K bath.    Objective:  Vital signs in last 24 hours:  Temp:  [97.2 F (36.2 C)-99 F (37.2 C)] 97.2 F (36.2 C) (09/25 0933) Pulse Rate:  [83-99] 99 (09/25 1130) Resp:  [12-26] 12 (09/25 1130) BP: (127-166)/(65-90) 162/80 (09/25 1130) SpO2:  [97 %-100 %] 100 % (09/25 1130) Weight:  [81 kg (178 lb 9.6 oz)] 81 kg (178 lb 9.6 oz) (09/25 0933)  Weight change:  Filed Weights   08/17/16 0946 08/17/16 1330 08/20/16 0933  Weight: 79.5 kg (175 lb 3.2 oz) 77 kg (169 lb 12.8 oz) 81 kg (178 lb 9.6 oz)    Intake/Output:   No intake or output data in the 24 hours ending 08/20/16 1207   Physical Exam: General: No acute distress, sitting in dialysis chair  HEENT /AT hearing intact  Neck Supple   Pulm/lungs Normal breathing effort, clear to auscultation   CVS/Heart Regular rhythm, no rub or gallop, Systolic murmur present  Abdomen:  Soft, nontender, nondistended   Extremities: No peripheral edema   Neurologic: Awake, alert, following commands  Skin: No acute rashes   Access: AV fistula        Basic Metabolic Panel:   Recent Labs Lab 08/15/16 0900 08/17/16 1135 08/20/16 0935  NA 134* 139 134*  K 6.1* 3.5 6.6*  CL 94* 99* 94*  CO2 28 31 27   GLUCOSE 173* 140* 114*  BUN 65* 19 72*  CREATININE 8.98* 3.34* 9.31*  CALCIUM 9.7 9.2 9.6  PHOS 5.3* 1.6* 4.9*     CBC:  Recent Labs Lab 08/15/16 0900 08/17/16 1135 08/20/16 0935  WBC 4.7 4.4 4.2  HGB 9.5* 9.8* 10.1*  HCT 27.7* 29.0* 29.1*  MCV 95.9 96.6 95.2  PLT 175 184 183      Microbiology:  No results found for this or any previous visit (from the past 720 hour(s)).  Coagulation Studies: No results for input(s): LABPROT, INR in the last 72 hours.  Urinalysis: No results for input(s): COLORURINE, LABSPEC, PHURINE, GLUCOSEU, HGBUR, BILIRUBINUR, KETONESUR, PROTEINUR, UROBILINOGEN, NITRITE, LEUKOCYTESUR in the last 72  hours.  Invalid input(s): APPERANCEUR    Imaging: No results found.   Medications:     . acetaminophen  650 mg Oral Once  . amLODipine  10 mg Oral QPM  . aspirin  81 mg Oral Daily  . cholecalciferol  800 Units Oral Daily  . cloNIDine  0.1 mg Oral BID  . divalproex  1,000 mg Oral QHS  . epoetin (EPOGEN/PROCRIT) injection  4,000 Units Intravenous Q M,W,F-HD  . escitalopram  15 mg Oral Daily  . feeding supplement (NEPRO CARB STEADY)  237 mL Oral BID BM  . hydrALAZINE  100 mg Oral TID  . insulin aspart  0-15 Units Subcutaneous TID WC  . insulin aspart  0-5 Units Subcutaneous QHS  . irbesartan  300 mg Oral QHS  . metoprolol tartrate  50 mg Oral BID  . QUEtiapine  200 mg Oral BID  . traZODone  100 mg Oral QHS   acetaminophen, heparin  Assessment/ Plan:  35 y.o. African-American female with developmental delay, ESRD, AOCD, SHPTH, HTN, schizophrenia, mild cognitive impairment, diabetes mellitus type II  [facility] [address]  1. hyperkalemia with ESRD on HD MWF:   K high again, pt eating bananas intermittently per her report, advised her against this, on 1K bath with HD today, will continue to monitor.   2.  Anemia of CKD:  -continue epogen 4000 units IV with dialysis, hgb currently 10.1.  3. SHPTH: not currently on binders. Phosphorus 4.9 and acceptable, continue to monitor.   4. Hypertension: Blood pressure labile, currently 162/80. - Amlodipine, clonidine, hydralazine, irbesartan, metoprolol     LOS: 1 Gwendloyn Forsee 9/25/201712:07 PM

## 2016-08-20 NOTE — ED Notes (Signed)
Report to amber, rn.  

## 2016-08-20 NOTE — ED Provider Notes (Signed)
-----------------------------------------   6:53 AM on 08/20/2016 -----------------------------------------   Blood pressure (!) 152/90, pulse 86, temperature 98.5 F (36.9 C), temperature source Oral, resp. rate 18, height 5\' 2"  (1.575 m), weight 169 lb 12.8 oz (77 kg), SpO2 99 %.  The patient had no acute events since last update.  Calm and cooperative at this time.  She will be going for dialysis today. Disposition is pending clinical social work team recommendations.     Paulette Blanch, MD 08/20/16 [phone]

## 2016-08-20 NOTE — Progress Notes (Signed)
Post hd assessment 

## 2016-08-20 NOTE — ED Notes (Signed)
Pt. back from dialysis

## 2016-08-20 NOTE — Progress Notes (Signed)
Post hd vitals 

## 2016-08-20 NOTE — ED Notes (Signed)
Pt  Vol  Pending  placement

## 2016-08-21 LAB — GLUCOSE, CAPILLARY
GLUCOSE-CAPILLARY: 212 mg/dL — AB (ref 65–99)
Glucose-Capillary: 86 mg/dL (ref 65–99)

## 2016-08-21 NOTE — ED Notes (Signed)
ED  Is the patient under IVC or is there intent for IVC: no  Is the patient medically cleared: Yes.   Is there vacancy in the ED BHU: Yes.   Is the population mix appropriate for patient: Yes.   Is the patient awaiting placement in inpatient or outpatient setting: Yes.   - group home placement  Has the patient had a psychiatric consult: Yes.   Survey of unit performed for contraband, proper placement and condition of furniture, tampering with fixtures in bathroom, shower, and each patient room: Yes.  ; Findings:  APPEARANCE/BEHAVIOR Calm and cooperative NEURO ASSESSMENT Orientation: oriented x4 Denies pain Hallucinations: No.None noted (Hallucinations) Speech: Normal Gait: normal RESPIRATORY ASSESSMENT Even  Unlabored respirations  CARDIOVASCULAR ASSESSMENT Pulses equal   regular rate  Skin warm and dry   GASTROINTESTINAL ASSESSMENT no GI complaint EXTREMITIES Full ROM  PLAN OF CARE Provide calm/safe environment. Vital signs assessed twice daily. ED BHU Assessment once each 12-hour shift. Collaborate with TTS daily or as condition indicates. Assure the ED provider has rounded once each shift. Provide and encourage hygiene. Provide redirection as needed. Assess for escalating behavior; address immediately and inform ED provider.  Assess family dynamic and appropriateness for visitation as needed: Yes.  ; If necessary, describe findings:  Educate the patient/family about BHU procedures/visitation: Yes.  ; If necessary, describe findings:

## 2016-08-21 NOTE — ED Notes (Signed)
Patient refused vsigns

## 2016-08-21 NOTE — ED Notes (Signed)
BEHAVIORAL HEALTH ROUNDING Patient sleeping: Yes.   Patient alert and oriented: eyes closed  Appears to be asleep Behavior appropriate: Yes.  ; If no, describe:  Nutrition and fluids offered:   Sleeping  Toileting and hygiene offered: sleeping Sitter present: q 15 minute observations and security monitoring Law enforcement present: yes  ODS 

## 2016-08-21 NOTE — ED Notes (Signed)
Pt refused to eat at this time, her food tray was placed in her room to eat when she wakes up

## 2016-08-21 NOTE — ED Notes (Signed)
Blood sugar was 86  Notified nurse AmyT.

## 2016-08-21 NOTE — ED Notes (Addendum)
Patient observed lying in bed with eyes closed  Even, unlabored respirations observed   NAD pt appears to be sleeping  I will continue to monitor along with every 15 minute visual observations and ongoing monitoring

## 2016-08-21 NOTE — ED Notes (Signed)
Lunch was given to patient 

## 2016-08-21 NOTE — ED Notes (Signed)
BEHAVIORAL HEALTH ROUNDING Patient sleeping: No. Patient alert and oriented: yes Behavior appropriate: Yes.  ; If no, describe:  Nutrition and fluids offered: yes Toileting and hygiene offered: Yes  Sitter present: q15 minute observations and security  monitoring Law enforcement present: Yes  ODS  

## 2016-08-21 NOTE — ED Notes (Signed)
Patient had shower

## 2016-08-21 NOTE — ED Notes (Signed)
BEHAVIORAL HEALTH ROUNDING Patient sleeping: Yes.   Patient alert and oriented: eyes closed  Appears asleep Behavior appropriate: Yes.  ; If no, describe:  Nutrition and fluids offered: Yes  Toileting and hygiene offered: sleeping Sitter present: q 15 minute observations and security monitoring Law enforcement present: yes  ODS 

## 2016-08-21 NOTE — ED Notes (Signed)
Patient given cereal and milk for snack

## 2016-08-21 NOTE — ED Notes (Signed)
Breakfast was placed in room ,

## 2016-08-21 NOTE — Progress Notes (Addendum)
CSW met with pt and took her outside for some fresh air. CSW was able to spend a considerable amount of time with the pt and pt spent most of this time talking about how she was feeling and certain events that have occurred throughout her life. Pt is in very good spirits considering the situation. CSW provided emotional support and supportive counseling.   CSW called Ms. Wallington [phone] from [facility] in [place], [place]. [name] is still interested in taking the pt at her group home and will submit a single case agreement to [facility] to move forward with placement. CSW explained that pt would likely have to receive dialysis at [facility] because this is where she has gone in the past and other dialysis centers in the area are unwilling to accept the pt.   CSW called pt's care coordinator Scarlette Shorts [phone]. Tsosie Billing states that she has not been able to get in contact with [facility] and is beginning to have doubts that the group home is still willing to accept pt. CSW expressed her understanding and stated that she would move forward with Ms. Wallington's group home as the pt's main focus. Tsosie Billing is agreeable to this plan and will review Ms. Wallington's single case agreement. Tsosie Billing is confident that she will be able to consult with her team about the case and get the agreement approved quickly.  CSW called [facility] (pt's former dialysis center) [phone] and spoke with Debbie. CSW asked if pt would be able to be reestablished at their facility. Facility is willing to accept pt on a Tues, Thurs, Sat schedule.   At this point placement with [facility] group home seems promising but is pending the approval of the single case agreement. CSW will continue to follow pt and assist as needed.  [name], MSW, [place]

## 2016-08-21 NOTE — ED Notes (Signed)
Pt was given their nutritional shake per diet order since pt refused to eat her last meal

## 2016-08-21 NOTE — ED Notes (Signed)
Am meds administered as ordered  Assessment completed

## 2016-08-21 NOTE — ED Notes (Signed)
BEHAVIORAL HEALTH ROUNDING Patient sleeping: No. Patient alert and oriented: yes Behavior appropriate: Yes.  ; If no, describe:  Nutrition and fluids offered: yes Toileting and hygiene offered: Yes  Sitter present: q15 minute observations and security monitoring Law enforcement present: Yes  ODS   She has been walking around asking everyone to go buy her some [place] food

## 2016-08-21 NOTE — ED Provider Notes (Signed)
-----------------------------------------   7:29 AM on 08/21/2016 -----------------------------------------   Blood pressure 123/68, pulse 92, temperature 98.8 F (37.1 C), temperature source Oral, resp. rate 17, height 5\' 2"  (1.575 m), weight 171 lb 12.8 oz (77.9 kg), SpO2 100 %.  The patient had no acute events since last update.  Calm and cooperative at this time.  Disposition is pending social work recommendations.     Loney Hering, MD 08/21/16 [phone]

## 2016-08-21 NOTE — ED Notes (Signed)
BEHAVIORAL HEALTH ROUNDING Patient sleeping: Yes.   Patient alert and oriented: eyes closed  Appears to be asleep Behavior appropriate: Yes.  ; If no, describe:  Nutrition and fluids offered: sleeping Toileting and hygiene offered: sleeping Sitter present: q 15 minute observations and security monitoring Law enforcement present: yes  ODS 

## 2016-08-21 NOTE — ED Notes (Signed)
BEHAVIORAL HEALTH ROUNDING Patient sleeping: Yes.   Patient alert and oriented: eyes closed  Appears asleep Behavior appropriate: Yes.  ; If no, describe:  Nutrition and fluids offered: Yes  sleeping Toileting and hygiene offered: sleeping Sitter present: q 15 minute observations and security monitoring Law enforcement present: yes  ODS  ENVIRONMENTAL ASSESSMENT Potentially harmful objects out of patient reach: Yes.   Personal belongings secured: Yes.   Patient dressed in hospital provided attire only: Yes.   Plastic bags out of patient reach: Yes.   Patient care equipment (cords, cables, call bells, lines, and drains) shortened, removed, or accounted for: Yes.   Equipment and supplies removed from bottom of stretcher: Yes.   Potentially toxic materials out of patient reach: Yes.   Sharps container removed or out of patient reach: Yes.

## 2016-08-22 LAB — RENAL FUNCTION PANEL
ALBUMIN: 3.6 g/dL (ref 3.5–5.0)
ANION GAP: 14 (ref 5–15)
BUN: 57 mg/dL — AB (ref 6–20)
CHLORIDE: 94 mmol/L — AB (ref 101–111)
CO2: 28 mmol/L (ref 22–32)
Calcium: 9.6 mg/dL (ref 8.9–10.3)
Creatinine, Ser: 8.49 mg/dL — ABNORMAL HIGH (ref 0.44–1.00)
GFR, EST AFRICAN AMERICAN: 6 mL/min — AB (ref >60)
GFR, EST NON AFRICAN AMERICAN: 5 mL/min — AB (ref >60)
Glucose, Bld: 162 mg/dL — ABNORMAL HIGH (ref 65–99)
PHOSPHORUS: 4.1 mg/dL (ref 2.5–4.6)
POTASSIUM: 5.6 mmol/L — AB (ref 3.5–5.1)
Sodium: 136 mmol/L (ref 135–145)

## 2016-08-22 LAB — GLUCOSE, CAPILLARY
GLUCOSE-CAPILLARY: 231 mg/dL — AB (ref 65–99)
GLUCOSE-CAPILLARY: 188 mg/dL — AB (ref 65–99)
Glucose-Capillary: 99 mg/dL (ref 65–99)
Glucose-Capillary: 207 mg/dL — ABNORMAL HIGH (ref 65–99)

## 2016-08-22 LAB — CBC
HEMATOCRIT: 27.2 % — AB (ref 35.0–47.0)
HEMOGLOBIN: 9.3 g/dL — AB (ref 12.0–16.0)
MCH: 32.8 pg (ref 26.0–34.0)
MCHC: 34.3 g/dL (ref 32.0–36.0)
MCV: 95.5 fL (ref 80.0–100.0)
Platelets: 149 10*3/uL — ABNORMAL LOW (ref 150–440)
RBC: 2.85 MIL/uL — AB (ref 3.80–5.20)
RDW: 14.4 % (ref 11.5–14.5)
WBC: 3.9 10*3/uL (ref 3.6–11.0)

## 2016-08-22 NOTE — ED Notes (Signed)
Pt still asking for shrimp fried rice and cheetos. Pt told no. Breakfest will be up around 8am. Pt requesting to talk to Dr. Owens Shark. Dr. Owens Shark made aware.

## 2016-08-22 NOTE — ED Notes (Signed)
Pt eating lunch currently.

## 2016-08-22 NOTE — Progress Notes (Signed)
Subjective:  Patient seen and evaluated during dialysis. Patient still has mild hyperkalemia with a serum potassium of 5.6. Hemoglobin currently 9.3.  Objective:  Vital signs in last 24 hours:  Temp:  [98.3 F (36.8 C)] 98.3 F (36.8 C) (09/27 0854) Pulse Rate:  [80-97] 96 (09/27 1200) Resp:  [9-26] 19 (09/27 1200) BP: (125-163)/(74-102) 146/76 (09/27 1200) SpO2:  [93 %-100 %] 93 % (09/27 1200) Weight:  [80.7 kg (178 lb)] 80.7 kg (178 lb) (09/27 0854)  Weight change:  Filed Weights   08/20/16 0933 08/20/16 1342 08/22/16 0854  Weight: 81 kg (178 lb 9.6 oz) 77.9 kg (171 lb 12.8 oz) 80.7 kg (178 lb)    Intake/Output:    Intake/Output Summary (Last 24 hours) at 08/22/16 1222 Last data filed at 08/22/16 0203  Gross per 24 hour  Intake              110 ml  Output                0 ml  Net              110 ml     Physical Exam: General: No acute distress, sitting in dialysis chair  HEENT [place]/AT hearing intact  Neck Supple   Pulm/lungs Normal breathing effort, clear to auscultation   CVS/Heart Regular rhythm, no rub or gallop, Systolic murmur present  Abdomen:  Soft, nontender, nondistended   Extremities: No peripheral edema   Neurologic: Awake, alert, following commands  Skin: No acute rashes   Access: AV fistula        Basic Metabolic Panel:   Recent Labs Lab 08/17/16 1135 08/20/16 0935 08/22/16 0900  NA 139 134* 136  K 3.5 6.6* 5.6*  CL 99* 94* 94*  CO2 31 27 28   GLUCOSE 140* 114* 162*  BUN 19 72* 57*  CREATININE 3.34* 9.31* 8.49*  CALCIUM 9.2 9.6 9.6  PHOS 1.6* 4.9* 4.1     CBC:  Recent Labs Lab 08/17/16 1135 08/20/16 0935 08/22/16 0900  WBC 4.4 4.2 3.9  HGB 9.8* 10.1* 9.3*  HCT 29.0* 29.1* 27.2*  MCV 96.6 95.2 95.5  PLT 184 183 149*      Microbiology:  No results found for this or any previous visit (from the past 720 hour(s)).  Coagulation Studies: No results for input(s): LABPROT, INR in the last 72 hours.  Urinalysis: No  results for input(s): COLORURINE, LABSPEC, PHURINE, GLUCOSEU, HGBUR, BILIRUBINUR, KETONESUR, PROTEINUR, UROBILINOGEN, NITRITE, LEUKOCYTESUR in the last 72 hours.  Invalid input(s): APPERANCEUR    Imaging: No results found.   Medications:     . acetaminophen  650 mg Oral Once  . amLODipine  10 mg Oral QPM  . aspirin  81 mg Oral Daily  . cholecalciferol  800 Units Oral Daily  . cloNIDine  0.1 mg Oral BID  . divalproex  1,000 mg Oral QHS  . epoetin (EPOGEN/PROCRIT) injection  4,000 Units Intravenous Q M,W,F-HD  . escitalopram  15 mg Oral Daily  . feeding supplement (NEPRO CARB STEADY)  237 mL Oral BID BM  . hydrALAZINE  100 mg Oral TID  . insulin aspart  0-15 Units Subcutaneous TID WC  . insulin aspart  0-5 Units Subcutaneous QHS  . irbesartan  300 mg Oral QHS  . metoprolol tartrate  50 mg Oral BID  . QUEtiapine  200 mg Oral BID  . traZODone  100 mg Oral QHS   acetaminophen, heparin  Assessment/ Plan:  35 y.o. African-American female with  developmental delay, ESRD, AOCD, SHPTH, HTN, schizophrenia, mild cognitive impairment, diabetes mellitus type II  [facility] MWF [address]  1. hyperkalemia with ESRD on HD MWF:   Potassium remains slightly high today at 5.6. Complete hemodialysis today with target ultrafiltration of 2 kg. Next dialysis on Friday.   2. Anemia of CKD:  -Hemoglobin currently 9.3. Continue Epogen 4000 units IV with dialysis.  3. SHPTH: Phosphorus normal at 4.1. The patient has not required therapy with binders.   4. Hypertension: Blood pressure currently 146/76. - Amlodipine, clonidine, hydralazine, irbesartan, metoprolol     LOS: 1 Taylon Coole 9/27/201712:22 PM

## 2016-08-22 NOTE — ED Notes (Addendum)
Pt taken to dialysis by ED Tech, Vivien Rota.

## 2016-08-22 NOTE — ED Notes (Signed)
Pt transported back from dialysis. Pt given lunch tray and two warm blankets

## 2016-08-22 NOTE — Progress Notes (Signed)
Post dialysis 

## 2016-08-22 NOTE — ED Notes (Signed)
Pt given breakfast tray

## 2016-08-22 NOTE — ED Notes (Signed)
Pt in dialysis. Lunch tray placed at bedside.

## 2016-08-22 NOTE — ED Notes (Signed)
VOL/Pending placement 

## 2016-08-22 NOTE — ED Notes (Signed)
Pt sat up to eat dinner after taking afternoon meds. Pt eyes closed with room light on at this time. Pt with no signs of acute distress, environment safe. Nothing needed from staff at this time

## 2016-08-22 NOTE — Progress Notes (Signed)
Dialysis started 

## 2016-08-22 NOTE — Progress Notes (Signed)
Pre Dialysis 

## 2016-08-22 NOTE — ED Notes (Signed)
Back from dialysis.

## 2016-08-22 NOTE — ED Notes (Signed)
Pt asking for shrimp fried rice, cheetos, and milk. Pt sitting up in the chair given warm blankets and remote. Pt told she cant have milk she has had her fluid allowance for the day. Pt noted to have lay chips at the bedside already that she is now eating.

## 2016-08-22 NOTE — Progress Notes (Signed)
Dialysis complete

## 2016-08-22 NOTE — ED Notes (Signed)
Report from [place], Therapist, sports. Care assumed by this RN.

## 2016-08-22 NOTE — ED Notes (Signed)
Pt currently sleeping in bed, no signs of distress, nothing needed by staff at this time

## 2016-08-22 NOTE — ED Notes (Signed)
Pt is requesting to talk to Dr. Owens Shark to get food. Pt made aware he is in another room will let him know.

## 2016-08-23 LAB — GLUCOSE, CAPILLARY
GLUCOSE-CAPILLARY: 162 mg/dL — AB (ref 65–99)
Glucose-Capillary: 115 mg/dL — ABNORMAL HIGH (ref 65–99)
Glucose-Capillary: 287 mg/dL — ABNORMAL HIGH (ref 65–99)

## 2016-08-23 NOTE — ED Notes (Signed)
BEHAVIORAL HEALTH ROUNDING  Patient sleeping: Yes Patient alert and oriented: Sleeping Behavior appropriate: Yes. ; If no, describe:  Nutrition and fluids offered: No, sleeping  Toileting and hygiene offered: No, sleeping  Sitter present: q15 minute observations and security monitoring  Law enforcement present: Yes ODS 

## 2016-08-23 NOTE — Progress Notes (Signed)
CSW called pt's care coordinator Scarlette Shorts [phone]. CSW inquired if the single case agreement sent by Ms. Watlington was received. Ms. Geoffry Paradise confirmed that the single case agreement has been received and will be reviewed by her team. The agreement could potentially be approved by next week. Although, the process could take up to two weeks at the longest.   CSW called [facility] [phone]. Jackelyn Poling states that she did receive the information that CSW sent on Tuesday and the facility is still willing to accept pt back for dialysis. CSW informed Jackelyn Poling that the pt may d/c within the next two weeks but will call her back when a definite d/c date is identified.  CSW called pt's legal guardian, Porfirio Mylar, [phone], no answer. CSW  informed her of above via confidential voicemail.  [name], MSW, [place]

## 2016-08-23 NOTE — ED Notes (Signed)
Blood sugar was 115

## 2016-08-23 NOTE — ED Notes (Signed)
Pt given nepro therapeutic nutrician drink

## 2016-08-23 NOTE — ED Notes (Signed)
Social worker informed this nurse that the pt has placement but they are waiting on paperwork/contracts. Pt will be housed at group home in [place] and will have dialysis at previous dialysis clinic in [place]. SW indicates pt should be discharged within 2 weeks.

## 2016-08-23 NOTE — ED Notes (Signed)
Pt given two cereals and milk at this time.

## 2016-08-23 NOTE — ED Notes (Signed)
Pt refused for VS and blood sugar to be taken at this time. Sarah,RN notified about this.

## 2016-08-23 NOTE — ED Notes (Signed)
Pt had a bed bath in her room with assistance from this techwas given new scrubs and deodorant; pts bed was changed with clean sheets, and wiped down, and house keeping swept and mopped floor;

## 2016-08-23 NOTE — ED Notes (Signed)
BEHAVIORAL HEALTH ROUNDING Patient sleeping: No. Patient alert and oriented: yes Behavior appropriate: Yes.  ; If no, describe:  Nutrition and fluids offered: yes Toileting and hygiene offered: Yes  Sitter present: q15 minute observations and security  monitoring Law enforcement present: Yes  ODS  

## 2016-08-23 NOTE — ED Notes (Signed)
Pt given cereal to eat as she refused to eat lunch tray provided.

## 2016-08-23 NOTE — ED Provider Notes (Signed)
-----------------------------------------   9:20 AM on 08/23/2016 -----------------------------------------   Blood pressure (!) 127/91, pulse 94, temperature 98.5 F (36.9 C), temperature source Oral, resp. rate 19, height 5\' 2"  (1.575 m), weight 170 lb 3.2 oz (77.2 kg), SpO2 100 %.  The patient had no acute events since last update.  Calm and cooperative at this time.  Patient still waiting coordination of group home placement and dialysis services. Recent social work update suggests that she will have a group home in [place], and her [facility] will accept her back.   Carrie Mew, MD 08/23/16 [phone]

## 2016-08-23 NOTE — ED Notes (Signed)
Pt refusing to let this RN or the tech check her BP or blood sugar. Pt also refusing to take her bedtime meds. This RN explained that if she didn't take her meds she would not be able to have anything but water until breakfast. Pt stated, "I don't give a fuck, leave me alone." MD informed.

## 2016-08-24 LAB — GLUCOSE, CAPILLARY
GLUCOSE-CAPILLARY: 79 mg/dL (ref 65–99)
GLUCOSE-CAPILLARY: 157 mg/dL — AB (ref 65–99)
Glucose-Capillary: 164 mg/dL — ABNORMAL HIGH (ref 65–99)
Glucose-Capillary: 199 mg/dL — ABNORMAL HIGH (ref 65–99)
Glucose-Capillary: 240 mg/dL — ABNORMAL HIGH (ref 65–99)

## 2016-08-24 LAB — PHOSPHORUS: PHOSPHORUS: 3.1 mg/dL (ref 2.5–4.6)

## 2016-08-24 MED ORDER — PATIROMER SORBITEX CALCIUM 8.4 G PO PACK
8.4000 g | PACK | Freq: Every day | ORAL | Status: DC
  Filled 2016-08-24 (×2): qty 4

## 2016-08-24 NOTE — ED Notes (Signed)
BEHAVIORAL HEALTH ROUNDING Patient sleeping: No. Patient alert and oriented: yes Behavior appropriate: Yes.  ; If no, describe:  Nutrition and fluids offered: yes Toileting and hygiene offered: Yes  Sitter present: q15 minute observations and security  monitoring Law enforcement present: Yes  ODS  

## 2016-08-24 NOTE — Progress Notes (Signed)
Post dialysis 

## 2016-08-24 NOTE — Progress Notes (Signed)
Dialysis started 

## 2016-08-24 NOTE — Progress Notes (Signed)
Subjective:  Patient completed hemodialysis today. Care management working towards a more permanent disposition. We have agreed to take her back for hemodialysis.  Objective:  Vital signs in last 24 hours:  Temp:  [97.9 F (36.6 C)-98.4 F (36.9 C)] 98.4 F (36.9 C) (09/29 1220) Pulse Rate:  [83-113] 104 (09/29 1220) Resp:  [13-22] 17 (09/29 1220) BP: (93-188)/(47-108) 170/72 (09/29 1220) SpO2:  [99 %-100 %] 100 % (09/29 1220) Weight:  [80.2 kg (176 lb 12.9 oz)-81.6 kg (179 lb 12.8 oz)] 80.2 kg (176 lb 12.9 oz) (09/29 1220)  Weight change:  Filed Weights   08/22/16 1246 08/24/16 0935 08/24/16 1220  Weight: 77.2 kg (170 lb 3.2 oz) 81.6 kg (179 lb 12.8 oz) 80.2 kg (176 lb 12.9 oz)    Intake/Output:    Intake/Output Summary (Last 24 hours) at 08/24/16 1242 Last data filed at 08/24/16 1220  Gross per 24 hour  Intake                0 ml  Output             1306 ml  Net            -1306 ml     Physical Exam: General: No acute distress, sitting in dialysis chair  HEENT [place]/AT hearing intact  Neck Supple   Pulm/lungs Normal breathing effort, clear to auscultation   CVS/Heart Regular rhythm, no rub or gallop, Systolic murmur present  Abdomen:  Soft, nontender, nondistended   Extremities: No peripheral edema   Neurologic: Awake, alert, following commands  Skin: No acute rashes   Access: AV fistula        Basic Metabolic Panel:   Recent Labs Lab 08/20/16 0935 08/22/16 0900 08/24/16 0930  NA 134* 136  --   K 6.6* 5.6*  --   CL 94* 94*  --   CO2 27 28  --   GLUCOSE 114* 162*  --   BUN 72* 57*  --   CREATININE 9.31* 8.49*  --   CALCIUM 9.6 9.6  --   PHOS 4.9* 4.1 3.1     CBC:  Recent Labs Lab 08/20/16 0935 08/22/16 0900  WBC 4.2 3.9  HGB 10.1* 9.3*  HCT 29.1* 27.2*  MCV 95.2 95.5  PLT 183 149*      Microbiology:  No results found for this or any previous visit (from the past 720 hour(s)).  Coagulation Studies: No results for input(s):  LABPROT, INR in the last 72 hours.  Urinalysis: No results for input(s): COLORURINE, LABSPEC, PHURINE, GLUCOSEU, HGBUR, BILIRUBINUR, KETONESUR, PROTEINUR, UROBILINOGEN, NITRITE, LEUKOCYTESUR in the last 72 hours.  Invalid input(s): APPERANCEUR    Imaging: No results found.   Medications:     . acetaminophen  650 mg Oral Once  . amLODipine  10 mg Oral QPM  . aspirin  81 mg Oral Daily  . cholecalciferol  800 Units Oral Daily  . cloNIDine  0.1 mg Oral BID  . divalproex  1,000 mg Oral QHS  . epoetin (EPOGEN/PROCRIT) injection  4,000 Units Intravenous Q M,W,F-HD  . escitalopram  15 mg Oral Daily  . feeding supplement (NEPRO CARB STEADY)  237 mL Oral BID BM  . hydrALAZINE  100 mg Oral TID  . insulin aspart  0-15 Units Subcutaneous TID WC  . insulin aspart  0-5 Units Subcutaneous QHS  . irbesartan  300 mg Oral QHS  . metoprolol tartrate  50 mg Oral BID  . QUEtiapine  200 mg Oral BID  .  traZODone  100 mg Oral QHS   acetaminophen, heparin  Assessment/ Plan:  35 y.o. African-American female with developmental delay, ESRD, AOCD, SHPTH, HTN, schizophrenia, mild cognitive impairment, diabetes mellitus type II  [facility] MWF [address]  1. hyperkalemia with ESRD on HD MWF:   We used one can to get better during dialysis today.  Patient tolerated treatment well.  Next calluses on Monday.  We will add patiromer to her medication regimen.  2. Anemia of CKD:  -continue Epogen 4000 units IV with dialysis.  3. SHPTH: continue to periodically monitor bone mineral metabolism parameters.  She has not required binder therapy during her stay in the emergency department.   4. Hypertension: Blood pressure has ranged between 124/79-188/78 over the past 24 hours. - Amlodipine, clonidine, hydralazine, irbesartan, metoprolol     LOS: 1 Beaumont Austad 9/29/201712:42 PM

## 2016-08-24 NOTE — ED Provider Notes (Signed)
-----------------------------------------   7:53 AM on 08/24/2016 -----------------------------------------   Blood pressure (!) 135/92, pulse 99, temperature 98.2 F (36.8 C), temperature source Oral, resp. rate 17, height 5\' 2"  (1.575 m), weight 170 lb 3.2 oz (77.2 kg), SpO2 100 %.  The patient had no acute events since last update.  Calm and cooperative at this time.  Social work continues to work on placement. Patient scheduled for outpatient dialysis today.     Joanne Gavel, MD 08/24/16 [phone]

## 2016-08-24 NOTE — ED Notes (Signed)
Pt. Woke up, pt. Requesting her meal tray.  Obtaining BSG first.

## 2016-08-24 NOTE — ED Notes (Signed)
Report received from [place], RN from dialysis. Reports 1.3 L removed.

## 2016-08-24 NOTE — Progress Notes (Signed)
LCSW had a brief visit with Ms Vanice Sarah and gave her a hug and encouragement for being so understanding. It was explained in simple terms that her placment requires a lot of paperwork and approval from her MCO and once it is done a contract will be made and she will be able to go to group home with [facility] ( 7-10 days)   LCSW called [facility] and spoke to Porfirio Mylar [phone] She is pleased and in aggreance with this plan and spoke to [facility] yesterday. I explained I would be available all weekend , every weekend should she need to speak with me and contact information was exchanged.  LCSW called and Spoke to [employer] at [phone] Her manager Trenda Moots is pushing the paperwork to endorse [facility] family care home and the Single Trace Agreement is awaiting authorization.   [employer] LCSW [phone]

## 2016-08-24 NOTE — ED Notes (Signed)
BEHAVIORAL HEALTH ROUNDING  Patient sleeping: Yes Patient alert and oriented: Sleeping Behavior appropriate: Yes. ; If no, describe:  Nutrition and fluids offered: No, sleeping  Toileting and hygiene offered: No, sleeping  Sitter present: q15 minute observations and security monitoring  Law enforcement present: Yes ODS 

## 2016-08-24 NOTE — Progress Notes (Signed)
Pre Dialysis 

## 2016-08-24 NOTE — ED Notes (Signed)
Pt has eaten breakfast and taken medications. Report given to Dialysis nurse and pt transported via wheelchair to dialysis.

## 2016-08-24 NOTE — Progress Notes (Signed)
Dialysis tx terminated 1 hr early at patients request.

## 2016-08-24 NOTE — ED Notes (Signed)
Patient in room washing up in  Wash pan time.

## 2016-08-25 LAB — GLUCOSE, CAPILLARY
GLUCOSE-CAPILLARY: 167 mg/dL — AB (ref 65–99)
GLUCOSE-CAPILLARY: 285 mg/dL — AB (ref 65–99)
Glucose-Capillary: 125 mg/dL — ABNORMAL HIGH (ref 65–99)
Glucose-Capillary: 104 mg/dL — ABNORMAL HIGH (ref 65–99)

## 2016-08-25 MED ORDER — PATIROMER SORBITEX CALCIUM 8.4 G PO PACK
8.4000 g | PACK | ORAL | Status: DC
  Administered 2016-08-26 – 2016-09-26 (×12): 8.4 g via ORAL
  Filled 2016-08-25 (×44): qty 4

## 2016-08-25 NOTE — ED Notes (Signed)
Called dietary to inquire about breakfast trays. They report trays were already sent. Informed then no food delivered and to please send ASAP

## 2016-08-25 NOTE — ED Provider Notes (Signed)
-----------------------------------------   7:24 AM on 08/25/2016 -----------------------------------------   Blood pressure (!) 167/82, pulse 82, temperature 98.4 F (36.9 C), temperature source Oral, resp. rate 18, height 5\' 2"  (1.575 m), weight 176 lb 12.9 oz (80.2 kg), SpO2 98 %.  The patient had no acute events since last update.  Calm and cooperative at this time.  Per social work, patient has been accepted at a new group home, paperwork in process and acceptance could take 7-10 days for approval.      Joanne Gavel, MD 08/25/16 [phone]

## 2016-08-25 NOTE — Progress Notes (Signed)
LCSW consulted with EDP and explained we are awaiting [facility] LME/[facility] to approve accepting group home and present the [facility] with a contract- Documentation has been expedited at per Ms Margarito Courser Hunt/[facility] yet it  will still take 7-10 days.   [employer] LCSW [phone]

## 2016-08-25 NOTE — ED Notes (Signed)
PT VOLUNTARY PENDING PLACEMENT.

## 2016-08-25 NOTE — ED Notes (Signed)
Pt assisted with shower and new scrubs and toiletries given by Tammy,[facility]

## 2016-08-25 NOTE — ED Notes (Signed)
Blood sugar was 285 @ 22:05.

## 2016-08-25 NOTE — ED Notes (Signed)
Pt given new pair of socks by this tech

## 2016-08-25 NOTE — ED Notes (Signed)
Attempts to wake pt. Unsuccessful.

## 2016-08-26 LAB — GLUCOSE, CAPILLARY
GLUCOSE-CAPILLARY: 130 mg/dL — AB (ref 65–99)
Glucose-Capillary: 210 mg/dL — ABNORMAL HIGH (ref 65–99)
Glucose-Capillary: 71 mg/dL (ref 65–99)
Glucose-Capillary: 162 mg/dL — ABNORMAL HIGH (ref 65–99)

## 2016-08-26 NOTE — ED Notes (Signed)
Pt ate all of breakfast tray. 

## 2016-08-26 NOTE — ED Notes (Signed)
BEHAVIORAL HEALTH ROUNDING  Patient sleeping: No.  Patient alert and oriented: yes  Behavior appropriate: Yes. ; If no, describe:  Nutrition and fluids offered: Yes  Toileting and hygiene offered: Yes  Sitter present: not applicable, Q 15 min safety rounds and observation.  Law enforcement present: Yes ODS  

## 2016-08-26 NOTE — ED Notes (Signed)
Sarah, EDT able to get vitals and CBG from pt

## 2016-08-26 NOTE — ED Notes (Signed)
Pt aggressively refusing medical care (CBG and vitals), swearing and yelling not to be touched, pt left alone att

## 2016-08-26 NOTE — ED Notes (Signed)
EMR clean-up att

## 2016-08-26 NOTE — ED Notes (Signed)
Report from [place], [place]. Care assumed by this RN.

## 2016-08-26 NOTE — ED Notes (Signed)
BEHAVIORAL HEALTH ROUNDING Patient sleeping: Yes.   Patient alert and oriented: not applicable SLEEPING Behavior appropriate: Yes.  ; If no, describe: SLEEPING Nutrition and fluids offered: No SLEEPING Toileting and hygiene offered: NoSLEEPING Sitter present: not applicable, Q 15 min safety rounds and observation. Law enforcement present: Yes ODS 

## 2016-08-26 NOTE — ED Notes (Signed)

## 2016-08-26 NOTE — ED Notes (Signed)
Pt stated that she was hungry and requested cereal.RN Ena Dawley said that if she took her [place], she would be able to have cereal. She quickly drank her medicine then received Cheerios.

## 2016-08-26 NOTE — ED Provider Notes (Signed)
-----------------------------------------   6:13 AM on 08/26/2016 -----------------------------------------   Blood pressure (!) 146/97, pulse 90, temperature 98.7 F (37.1 C), resp. rate 18, height 5\' 2"  (1.575 m), weight 80.2 kg, SpO2 99 %.  The patient had no acute events since last update.  Calm and cooperative at this time.  Disposition is pending according to social work, may be going to a new group home within the next 7-10 days.    Hinda Kehr, MD 08/26/16 [phone]

## 2016-08-26 NOTE — ED Notes (Signed)
Report received from [place], [place]. This RN assumed patient care.

## 2016-08-26 NOTE — ED Notes (Signed)
Pt drank part of meds then refused the rest

## 2016-08-27 LAB — GLUCOSE, CAPILLARY
GLUCOSE-CAPILLARY: 86 mg/dL (ref 65–99)
Glucose-Capillary: 222 mg/dL — ABNORMAL HIGH (ref 65–99)
Glucose-Capillary: 120 mg/dL — ABNORMAL HIGH (ref 65–99)
Glucose-Capillary: 100 mg/dL — ABNORMAL HIGH (ref 65–99)

## 2016-08-27 NOTE — ED Notes (Signed)
Report from [place], rn.

## 2016-08-27 NOTE — ED Notes (Signed)
Dialysis reported Kelly [name] did great during dialysis. 2 1/2 L drained off. 156/82 b/p hf 104 v/s stable throughout per dialysis

## 2016-08-27 NOTE — ED Notes (Signed)
Pt refuses to allow rn to recheck blood pressure or take norvasc. Pt states 'leave me alone, don't you touch me."

## 2016-08-27 NOTE — ED Notes (Signed)
Pt ambulatory without difficulty. Pt cooperative.

## 2016-08-27 NOTE — ED Notes (Signed)
Patient said that she did not want to take a shower this morning.

## 2016-08-27 NOTE — ED Notes (Signed)
Pt transferred to dialysis.

## 2016-08-27 NOTE — Progress Notes (Signed)
Dialysis Completed without issue. Pt tolerated well

## 2016-08-27 NOTE — Progress Notes (Signed)
Subjective:   Seen and examined on hemodialysis. Tolerating treatment well. UF of 2.5 litres  Objective:  Vital signs in last 24 hours:  Temp:  [98 F (36.7 C)-98.9 F (37.2 C)] 98.4 F (36.9 C) (10/02 1329) Pulse Rate:  [79-116] 103 (10/02 1329) Resp:  [15-18] 15 (10/02 1329) BP: (142-174)/(62-98) 156/82 (10/02 1329) SpO2:  [99 %-100 %] 100 % (10/02 1329) Weight:  [78.3 kg (172 lb 9.6 oz)-81.6 kg (180 lb)] 78.3 kg (172 lb 9.6 oz) (10/02 1338)  Weight change:  Filed Weights   08/24/16 1220 08/27/16 0940 08/27/16 1338  Weight: 80.2 kg (176 lb 12.9 oz) 81.6 kg (180 lb) 78.3 kg (172 lb 9.6 oz)    Intake/Output:    Intake/Output Summary (Last 24 hours) at 08/27/16 1340 Last data filed at 08/27/16 1314  Gross per 24 hour  Intake                0 ml  Output             2500 ml  Net            -2500 ml     Physical Exam: General: No acute distress, sitting in dialysis chair  HEENT [place]/AT hearing intact  Neck Supple   Pulm/lungs Normal breathing effort, clear to auscultation   CVS/Heart Regular rhythm, no rub or gallop, Systolic murmur present  Abdomen:  Soft, nontender, nondistended   Extremities: No peripheral edema   Neurologic: Awake, alert, following commands  Skin: No acute rashes   Access: AV fistula        Basic Metabolic Panel:   Recent Labs Lab 08/22/16 0900 08/24/16 0930  NA 136  --   K 5.6*  --   CL 94*  --   CO2 28  --   GLUCOSE 162*  --   BUN 57*  --   CREATININE 8.49*  --   CALCIUM 9.6  --   PHOS 4.1 3.1     CBC:  Recent Labs Lab 08/22/16 0900  WBC 3.9  HGB 9.3*  HCT 27.2*  MCV 95.5  PLT 149*      Microbiology:  No results found for this or any previous visit (from the past 720 hour(s)).  Coagulation Studies: No results for input(s): LABPROT, INR in the last 72 hours.  Urinalysis: No results for input(s): COLORURINE, LABSPEC, PHURINE, GLUCOSEU, HGBUR, BILIRUBINUR, KETONESUR, PROTEINUR, UROBILINOGEN, NITRITE, LEUKOCYTESUR  in the last 72 hours.  Invalid input(s): APPERANCEUR    Imaging: No results found.   Medications:     . acetaminophen  650 mg Oral Once  . amLODipine  10 mg Oral QPM  . aspirin  81 mg Oral Daily  . cholecalciferol  800 Units Oral Daily  . cloNIDine  0.1 mg Oral BID  . divalproex  1,000 mg Oral QHS  . epoetin (EPOGEN/PROCRIT) injection  4,000 Units Intravenous Q M,W,F-HD  . escitalopram  15 mg Oral Daily  . feeding supplement (NEPRO CARB STEADY)  237 mL Oral BID BM  . hydrALAZINE  100 mg Oral TID  . insulin aspart  0-15 Units Subcutaneous TID WC  . insulin aspart  0-5 Units Subcutaneous QHS  . irbesartan  300 mg Oral QHS  . metoprolol tartrate  50 mg Oral BID  . patiromer  8.4 g Oral Q24H  . QUEtiapine  200 mg Oral BID  . traZODone  100 mg Oral QHS   acetaminophen, heparin  Assessment/ Plan:  35 y.o. African-American female with developmental  delay, ESRD, AOCD, SHPTH, HTN, schizophrenia, mild cognitive impairment, diabetes mellitus type II  [facility] MWF [address] [address]  1. Hyperkalemia: hemodialysis with 2 K bath  2. Hypertension: elevated  - Amlodipine, clonidine, hydralazine, irbesartan, metoprolol  3. Anemia of chronic kidney disease  - epo with dialysis treatment.   4. Secondary Hyperparathyroidism - currently not on binders  5. End Stage Renal Disease: on hemodialysis MWF schedule.    LOS: 1 Guilherme Schwenke 10/2/20171:40 PM

## 2016-08-27 NOTE — Progress Notes (Signed)
Pre-Dialysis Assessment. 

## 2016-08-27 NOTE — Progress Notes (Signed)
Hemodialysis initiated without issue

## 2016-08-27 NOTE — ED Notes (Signed)
Pt. back from dialysis

## 2016-08-27 NOTE — ED Notes (Signed)
Patient is down in the dialysis unit getting a dialysis treatment. While patient is gone, I cleaned the room, the bed, and had environmental services to sweep and mop the floor.

## 2016-08-27 NOTE — Progress Notes (Signed)
Pre Dialysis 

## 2016-08-27 NOTE — ED Provider Notes (Signed)
-----------------------------------------   6:22 AM on 08/27/2016 -----------------------------------------   Blood pressure (!) 163/87, pulse 85, temperature 98.2 F (36.8 C), temperature source Oral, resp. rate 18, height 5\' 2"  (1.575 m), weight 176 lb 12.9 oz (80.2 kg), SpO2 99 %.  The patient had no acute events since last update.  Calm and cooperative at this time.  Disposition is pending social work recommendations. At this time we are awaiting acceptance and paperwork to clear for the patient to be sent to a new group home. The patient does have some facial swelling but she will receive dialysis today. She has been up and walking around with no acute agitation.     Loney Hering, MD 08/27/16 [phone]

## 2016-08-27 NOTE — ED Notes (Signed)
Pt VOL/pending placement. 

## 2016-08-27 NOTE — Progress Notes (Signed)
Post Dialysis Assessment.   

## 2016-08-28 LAB — GLUCOSE, CAPILLARY
GLUCOSE-CAPILLARY: 243 mg/dL — AB (ref 65–99)
Glucose-Capillary: 113 mg/dL — ABNORMAL HIGH (ref 65–99)
Glucose-Capillary: 91 mg/dL (ref 65–99)

## 2016-08-28 NOTE — ED Notes (Signed)
PT  VOL/  PENDING  PLACEMENT 

## 2016-08-28 NOTE — ED Notes (Signed)
Outside with social worker

## 2016-08-28 NOTE — ED Provider Notes (Signed)
-----------------------------------------   6:43 AM on 08/28/2016 -----------------------------------------   Blood pressure 139/73, pulse 85, temperature 98.4 F (36.9 C), temperature source Oral, resp. rate 16, height 5\' 2"  (1.575 m), weight 78.3 kg, SpO2 99 %.  The patient had no acute events since last update.  Calm and cooperative at this time.      Hinda Kehr, MD 08/28/16 [phone]

## 2016-08-28 NOTE — Progress Notes (Signed)
CSW received a call from Scarlette Shorts (pt's care coordinator) [phone]. Ms. Geoffry Paradise states that the single case agreement was not received by her team last week. She thought that it had been submitted and received however, her team was unable to find the fax. Ms. Geoffry Paradise resent the agreement via e-mail today and confirmed that it was received. She was informed that there are changes that need to be made to the agreement so she will contact Ms. Watlington to make the changes and resubmit the updated single case agreement hopefully no later than tomorrow. This may delay d/c because the case agreement will not be able to be approved as quickly as expected. However, Tsosie Billing states that once the agreement is updated and resubmitted she will continue to push her supervisors to expedite the approval.   [name], MSW, [place]

## 2016-08-28 NOTE — ED Notes (Signed)
CBG blood glucose 218. [place] notified.

## 2016-08-28 NOTE — ED Notes (Signed)
Pt. Woke up, vitals obtained.

## 2016-08-28 NOTE — ED Notes (Signed)
Pt refused lab draw for phosphorus.

## 2016-08-28 NOTE — Progress Notes (Signed)
CSW took pt outside earlier today for some fresh air. CSW asked how pt has been doing and provided her with updates on her placement. Pt states that she is doing well today and mentioned that being outside helps to improve her mood. She also expressed excitement about her upcoming placement at Ms. Watlington's [place] and is glad that she will be able to continue dialysis at her previous dialysis center in [place]. CSW provided supportive counseling and also encouraged the pt to brainstorm ways in which her new placement can be more successful than her last. Pt states that she believes this placement will be different because Ms. Oswaldo Milian will take better care of her. Pt also mentioned serveral times that when she leaves the hospital she is looking forward to eating shrimp fried rice, chicken wings, and cereal. Pt was in good spirits today and appreciated the time that CSW spent with her.  CSW called Tsosie Billing [phone]. No answer, left a message.  Kelly [name], MSW, [place]

## 2016-08-28 NOTE — ED Notes (Signed)
Breakfast was placed in patient 's room, patient in bathroom.

## 2016-08-28 NOTE — ED Notes (Addendum)
Patient  In shower

## 2016-08-28 NOTE — ED Notes (Signed)
Patient refused vitals signs ,notified nurse Joellen Jersey

## 2016-08-28 NOTE — ED Notes (Signed)
Pt discharged out by mistake by Kandee Keen, RN

## 2016-08-29 LAB — GLUCOSE, CAPILLARY
GLUCOSE-CAPILLARY: 84 mg/dL (ref 65–99)
GLUCOSE-CAPILLARY: 223 mg/dL — AB (ref 65–99)
Glucose-Capillary: 218 mg/dL — ABNORMAL HIGH (ref 65–99)
Glucose-Capillary: 123 mg/dL — ABNORMAL HIGH (ref 65–99)
Glucose-Capillary: 142 mg/dL — ABNORMAL HIGH (ref 65–99)

## 2016-08-29 LAB — RENAL FUNCTION PANEL
Albumin: 3.6 g/dL (ref 3.5–5.0)
Anion gap: 16 — ABNORMAL HIGH (ref 5–15)
BUN: 61 mg/dL — ABNORMAL HIGH (ref 6–20)
CHLORIDE: 93 mmol/L — AB (ref 101–111)
CO2: 25 mmol/L (ref 22–32)
Calcium: 9.5 mg/dL (ref 8.9–10.3)
Creatinine, Ser: 8.56 mg/dL — ABNORMAL HIGH (ref 0.44–1.00)
GFR, EST AFRICAN AMERICAN: 6 mL/min — AB (ref >60)
GFR, EST NON AFRICAN AMERICAN: 5 mL/min — AB (ref >60)
Glucose, Bld: 177 mg/dL — ABNORMAL HIGH (ref 65–99)
POTASSIUM: 5.9 mmol/L — AB (ref 3.5–5.1)
Phosphorus: 4.7 mg/dL — ABNORMAL HIGH (ref 2.5–4.6)
Sodium: 134 mmol/L — ABNORMAL LOW (ref 135–145)

## 2016-08-29 LAB — CBC
HEMATOCRIT: 29.8 % — AB (ref 35.0–47.0)
HEMOGLOBIN: 10 g/dL — AB (ref 12.0–16.0)
MCH: 31.9 pg (ref 26.0–34.0)
MCHC: 33.6 g/dL (ref 32.0–36.0)
MCV: 95 fL (ref 80.0–100.0)
Platelets: 160 10*3/uL (ref 150–440)
RBC: 3.14 MIL/uL — AB (ref 3.80–5.20)
RDW: 14.2 % (ref 11.5–14.5)
WBC: 4.5 10*3/uL (ref 3.6–11.0)

## 2016-08-29 LAB — PHOSPHORUS: Phosphorus: 4.7 mg/dL — ABNORMAL HIGH (ref 2.5–4.6)

## 2016-08-29 MED ORDER — BACITRACIN ZINC 500 UNIT/GM EX OINT
TOPICAL_OINTMENT | CUTANEOUS | Status: AC
  Filled 2016-08-29: qty 0.9

## 2016-08-29 NOTE — ED Notes (Signed)
Pt escorted to dialysis

## 2016-08-29 NOTE — Progress Notes (Signed)
Post Dialysis assessment

## 2016-08-29 NOTE — Progress Notes (Signed)
Subjective:   Seen and examined on hemodialysis. Tolerating treatment well. UF of 2 litres. 2K  Objective:  Vital signs in last 24 hours:  Temp:  [98.1 F (36.7 C)-99.2 F (37.3 C)] 99.2 F (37.3 C) (10/04 0927) Pulse Rate:  [85-101] 101 (10/04 1100) Resp:  [12-21] 20 (10/04 1100) BP: (106-163)/(59-102) 141/71 (10/04 1100) SpO2:  [95 %-100 %] 100 % (10/04 1100) Weight:  [81.5 kg (179 lb 9.6 oz)] 81.5 kg (179 lb 9.6 oz) (10/04 0927)  Weight change:  Filed Weights   08/27/16 0940 08/27/16 1338 08/29/16 0927  Weight: 81.6 kg (180 lb) 78.3 kg (172 lb 9.6 oz) 81.5 kg (179 lb 9.6 oz)    Intake/Output:   No intake or output data in the 24 hours ending 08/29/16 1106   Physical Exam: General: No acute distress, sitting in dialysis chair  HEENT [place]/AT hearing intact  Neck Supple   Pulm/lungs Normal breathing effort, clear to auscultation   CVS/Heart Regular rhythm, no rub or gallop, Systolic murmur present  Abdomen:  Soft, nontender, nondistended   Extremities: No peripheral edema   Neurologic: Awake, alert, following commands  Skin: No acute rashes   Access: AV fistula        Basic Metabolic Panel:   Recent Labs Lab 08/24/16 0930 08/29/16 0930 08/29/16 0934  NA  --   --  134*  K  --   --  5.9*  CL  --   --  93*  CO2  --   --  25  GLUCOSE  --   --  177*  BUN  --   --  61*  CREATININE  --   --  8.56*  CALCIUM  --   --  9.5  PHOS 3.1 4.7* 4.7*     CBC:  Recent Labs Lab 08/29/16 0930  WBC 4.5  HGB 10.0*  HCT 29.8*  MCV 95.0  PLT 160      Microbiology:  No results found for this or any previous visit (from the past 720 hour(s)).  Coagulation Studies: No results for input(s): LABPROT, INR in the last 72 hours.  Urinalysis: No results for input(s): COLORURINE, LABSPEC, PHURINE, GLUCOSEU, HGBUR, BILIRUBINUR, KETONESUR, PROTEINUR, UROBILINOGEN, NITRITE, LEUKOCYTESUR in the last 72 hours.  Invalid input(s): APPERANCEUR    Imaging: No results  found.   Medications:     . bacitracin      . acetaminophen  650 mg Oral Once  . amLODipine  10 mg Oral QPM  . aspirin  81 mg Oral Daily  . cholecalciferol  800 Units Oral Daily  . cloNIDine  0.1 mg Oral BID  . divalproex  1,000 mg Oral QHS  . epoetin (EPOGEN/PROCRIT) injection  4,000 Units Intravenous Q M,W,F-HD  . escitalopram  15 mg Oral Daily  . feeding supplement (NEPRO CARB STEADY)  237 mL Oral BID BM  . hydrALAZINE  100 mg Oral TID  . insulin aspart  0-15 Units Subcutaneous TID WC  . insulin aspart  0-5 Units Subcutaneous QHS  . irbesartan  300 mg Oral QHS  . metoprolol tartrate  50 mg Oral BID  . patiromer  8.4 g Oral Q24H  . QUEtiapine  200 mg Oral BID  . traZODone  100 mg Oral QHS   acetaminophen, heparin  Assessment/ Plan:  35 y.o. African-American female with developmental delay, ESRD, AOCD, SHPTH, HTN, schizophrenia, mild cognitive impairment, diabetes mellitus type II  [facility] MWF [address] [address]  1. Hyperkalemia: hemodialysis with 2 K bath  2. Hypertension: elevated  - Amlodipine, clonidine, hydralazine, irbesartan, metoprolol  3. Anemia of chronic kidney disease  - epo with dialysis treatment.   4. Secondary Hyperparathyroidism - currently not on binders  5. End Stage Renal Disease: on hemodialysis MWF schedule.  - working on discharge planning.    LOS: [place], [place] 10/4/201711:06 AM

## 2016-08-29 NOTE — ED Notes (Signed)
BEHAVIORAL HEALTH ROUNDING Patient sleeping: Yes.   Patient alert and oriented: eyes closed  Appears to be asleep Behavior appropriate: Yes.  ; If no, describe:  Nutrition and fluids offered: Yes  Toileting and hygiene offered: sleeping Sitter present: q 15 minute observations and security monitoring Law enforcement present: yes  ODS 

## 2016-08-29 NOTE — Progress Notes (Signed)
Dialysis started 

## 2016-08-29 NOTE — ED Notes (Signed)
ED BHU [place] Is the patient under IVC or is there intent for IVC: voluntary Is the patient medically cleared: Yes.   Is there vacancy in the ED BHU: Yes.   Is the population mix appropriate for patient: Yes.   Is the patient awaiting placement in inpatient or outpatient setting: group home placement Has the patient had a psychiatric consult: Yes.   Survey of unit performed for contraband, proper placement and condition of furniture, tampering with fixtures in bathroom, shower, and each patient room: Yes.  ; Findings:  APPEARANCE/BEHAVIOR Calm and cooperative NEURO ASSESSMENT Orientation: oriented x3  Denies pain Hallucinations: No.None noted (Hallucinations) Speech: Normal Gait: normal RESPIRATORY ASSESSMENT Even  Unlabored respirations  CARDIOVASCULAR ASSESSMENT Pulses equal   regular rate  Skin warm and dry   GASTROINTESTINAL ASSESSMENT no GI complaint EXTREMITIES Full ROM  PLAN OF CARE Provide calm/safe environment. Vital signs assessed twice daily. ED BHU Assessment once each 12-hour shift. Collaborate with TTS daily or as condition indicates. Assure the ED provider has rounded once each shift. Provide and encourage hygiene. Provide redirection as needed. Assess for escalating behavior; address immediately and inform ED provider.  Assess family dynamic and appropriateness for visitation as needed: Yes.  ; If necessary, describe findings:  Educate the patient/family about BHU procedures/visitation: Yes.  ; If necessary, describe findings:

## 2016-08-29 NOTE — Progress Notes (Signed)
Pre Dialysis 

## 2016-08-29 NOTE — ED Notes (Signed)
Meal placed in her room  She continues to sleep quietly

## 2016-08-29 NOTE — Progress Notes (Signed)
Dialysis complete

## 2016-08-29 NOTE — ED Notes (Addendum)
Report received from [facility]   Pt is currently in dialysis

## 2016-08-29 NOTE — Progress Notes (Signed)
CSW came to visit with pt. Pt was sleeping. CSW will try at another time.  [name], MSW, [place]

## 2016-08-29 NOTE — ED Notes (Signed)
VOL/Pending placement 

## 2016-08-29 NOTE — ED Provider Notes (Signed)
-----------------------------------------   7:20 AM on 08/29/2016 -----------------------------------------   Blood pressure 132/79, pulse 89, temperature 98.4 F (36.9 C), temperature source Oral, resp. rate 18, height 5\' 2"  (1.575 m), weight 172 lb 9.6 oz (78.3 kg), SpO2 100 %.  The patient had no acute events since last update.  Calm and cooperative at this time.  Disposition is pending Psychiatry/Behavioral Medicine team recommendations.     Joanne Gavel, MD 08/29/16 [phone]

## 2016-08-30 LAB — HEPATITIS B CORE ANTIBODY, TOTAL: HEP B C TOTAL AB: NEGATIVE

## 2016-08-30 LAB — GLUCOSE, CAPILLARY
GLUCOSE-CAPILLARY: 130 mg/dL — AB (ref 65–99)
Glucose-Capillary: 170 mg/dL — ABNORMAL HIGH (ref 65–99)
Glucose-Capillary: 217 mg/dL — ABNORMAL HIGH (ref 65–99)

## 2016-08-30 LAB — HEPATITIS B SURFACE ANTIGEN: HEP B S AG: NEGATIVE

## 2016-08-30 LAB — HEPATITIS B SURFACE ANTIBODY,QUALITATIVE: HEP B S AB: REACTIVE

## 2016-08-30 NOTE — ED Notes (Signed)
BEHAVIORAL HEALTH ROUNDING  Patient sleeping: Yes Patient alert and oriented: Sleeping Behavior appropriate: Yes. ; If no, describe:  Nutrition and fluids offered: No, sleeping  Toileting and hygiene offered: No, sleeping  Sitter present: q15 minute observations and security monitoring  Law enforcement present: Yes ODS 

## 2016-08-30 NOTE — ED Notes (Signed)
Blood sugar was 170 notified nurse Genelle Gather.

## 2016-08-30 NOTE — Consult Note (Signed)
Psychiatry: Patient remains completely stable and in no way requiring inpatient psychiatric treatment. Continue support and follow-up while she is in the emergency room and we are awaiting appropriate disposition.

## 2016-08-30 NOTE — ED Provider Notes (Signed)
-----------------------------------------   6:03 AM on 08/30/2016 -----------------------------------------   Blood pressure 129/62, pulse 86, temperature 98.7 F (37.1 C), temperature source Oral, resp. rate 16, height 5\' 2"  (1.575 m), weight 79.5 kg, SpO2 100 %.  The patient had no acute events since last update.  Calm and cooperative at this time.     Hinda Kehr, MD 08/30/16 [phone]

## 2016-08-30 NOTE — ED Notes (Signed)
BEHAVIORAL HEALTH ROUNDING Patient sleeping: Yes.   Patient alert and oriented: not applicable Behavior appropriate: Yes.  ; If no, describe:  Nutrition and fluids offered: No Toileting and hygiene offered: No Sitter present: yes Law enforcement present: Yes ODS

## 2016-08-30 NOTE — ED Notes (Signed)
BEHAVIORAL HEALTH ROUNDING Patient sleeping: No. Patient alert and oriented: yes Behavior appropriate: Yes.  ; If no, describe:  Nutrition and fluids offered: Yes  Toileting and hygiene offered: Yes  Sitter present: yes Law enforcement present: Yes  

## 2016-08-30 NOTE — ED Notes (Signed)
BEHAVIORAL HEALTH ROUNDING Patient sleeping: Yes.   Patient alert and oriented: not applicable Behavior appropriate: Yes.  ; If no, describe:  Nutrition and fluids offered: No Toileting and hygiene offered: No Sitter present: yes Law enforcement present: Yes   ENVIRONMENTAL ASSESSMENT Potentially harmful objects out of patient reach: Yes.   Personal belongings secured: Yes.   Patient dressed in hospital provided attire only: Yes.   Plastic bags out of patient reach: Yes.   Patient care equipment (cords, cables, call bells, lines, and drains) shortened, removed, or accounted for: Yes.   Equipment and supplies removed from bottom of stretcher: Yes.   Potentially toxic materials out of patient reach: Yes.   Sharps container removed or out of patient reach: Yes.    

## 2016-08-30 NOTE — ED Notes (Signed)
Patient in room washing up at bedside.

## 2016-08-30 NOTE — ED Notes (Signed)
CSW took pt outside for some fresh air and painted pt's nails. Pt was a little less talkative today than usual but still in relatively good spirits. CSW updated pt on her placement status and processed coping skills with her that she could utilize at her new placement. Pt expressed excitement about her upcoming d/c.  CSW called pt's [facility] Care Coordinator [facility] [place]) at [phone]. No answer, left a message. CSW awaiting call back.  [name], MSW, [place]

## 2016-08-30 NOTE — ED Notes (Signed)
Pt back to room - sw took her for a walk and helped her paint her nails.

## 2016-08-30 NOTE — ED Notes (Signed)
Breakfast was placed in room patient sleeping at this time

## 2016-08-31 LAB — GLUCOSE, CAPILLARY
GLUCOSE-CAPILLARY: 87 mg/dL (ref 65–99)
Glucose-Capillary: 212 mg/dL — ABNORMAL HIGH (ref 65–99)
Glucose-Capillary: 184 mg/dL — ABNORMAL HIGH (ref 65–99)

## 2016-08-31 NOTE — ED Provider Notes (Addendum)
-----------------------------------------   2:58 AM on 08/31/2016 -----------------------------------------   Blood pressure (!) 157/90, pulse 91, temperature 98.8 F (37.1 C), temperature source Oral, resp. rate 18, height 5\' 2"  (1.575 m), weight 175 lb 4.3 oz (79.5 kg), SpO2 98 %.  The patient had no acute events since last update.  Calm and cooperative at this time.  Disposition is pending social work placement.     Harvest Dark, MD 08/31/16 1007    Harvest Dark, MD 08/31/16 [phone]

## 2016-08-31 NOTE — ED Notes (Signed)
BEHAVIORAL HEALTH ROUNDING  Patient sleeping: Yes Patient alert and oriented: Sleeping Behavior appropriate: Yes. ; If no, describe:  Nutrition and fluids offered: No, sleeping  Toileting and hygiene offered: No, sleeping  Sitter present: q15 minute observations and security monitoring  Law enforcement present: Yes ODS 

## 2016-08-31 NOTE — Progress Notes (Signed)
Pre-Dialysis Assessment. 

## 2016-08-31 NOTE — ED Notes (Signed)
EMR clean up att

## 2016-08-31 NOTE — Progress Notes (Signed)
Dialysis completed without issue. Total UF 2.5L per order.

## 2016-08-31 NOTE — Progress Notes (Signed)
PRE Dialysis

## 2016-08-31 NOTE — Progress Notes (Signed)
LCSW received call from Alben Spittle at [facility] and she reported she will be supporting Ms Kelly [name] today to complete the paper work for single case agreement by today and from here the approval process should be streamlined 7 days ( approx). LCSW will continue to [employer] LCSW [phone]

## 2016-08-31 NOTE — Progress Notes (Signed)
Subjective:   Seen and examined on hemodialysis. Tolerating treatment well. UF of 2 litres. 2K    HEMODIALYSIS FLOWSHEET:  Blood Flow Rate (mL/min): 400 mL/min Arterial Pressure (mmHg): -120 mmHg Venous Pressure (mmHg): 250 mmHg Transmembrane Pressure (mmHg): 50 mmHg Ultrafiltration Rate (mL/min): 1000 mL/min Dialysate Flow Rate (mL/min): 600 ml/min Conductivity: Machine : 14.1 Conductivity: Machine : 14.1 Dialysis Fluid Bolus: Normal Saline Bolus Amount (mL): 250 mL Dialysate Change: 2K Intra-Hemodialysis Comments: 925. no change   Objective:  Vital signs in last 24 hours:  Temp:  [98.6 F (37 C)-98.8 F (37.1 C)] 98.6 F (37 C) (10/06 0905) Pulse Rate:  [85-91] 91 (10/06 1000) Resp:  [11-20] 16 (10/06 1000) BP: (101-175)/(66-90) 146/71 (10/06 1000) SpO2:  [98 %-100 %] 100 % (10/06 1000) Weight:  [82.4 kg (181 lb 9.6 oz)] 82.4 kg (181 lb 9.6 oz) (10/06 0905)  Weight change:  Filed Weights   08/29/16 0927 08/29/16 1246 08/31/16 0905  Weight: 81.5 kg (179 lb 9.6 oz) 79.5 kg (175 lb 4.3 oz) 82.4 kg (181 lb 9.6 oz)    Intake/Output:   No intake or output data in the 24 hours ending 08/31/16 1022   Physical Exam: General: No acute distress, sitting in dialysis chair  HEENT [place]/AT hearing intact  Neck Supple   Pulm/lungs Normal breathing effort, clear to auscultation   CVS/Heart Regular rhythm, no rub or gallop, Systolic murmur present  Abdomen:  Soft, nontender, nondistended   Extremities: No peripheral edema   Neurologic: Awake, alert, following commands  Skin: No acute rashes   Access: AV fistula        Basic Metabolic Panel:   Recent Labs Lab 08/29/16 0930 08/29/16 0934  NA  --  134*  K  --  5.9*  CL  --  93*  CO2  --  25  GLUCOSE  --  177*  BUN  --  61*  CREATININE  --  8.56*  CALCIUM  --  9.5  PHOS 4.7* 4.7*     CBC:  Recent Labs Lab 08/29/16 0930  WBC 4.5  HGB 10.0*  HCT 29.8*  MCV 95.0  PLT 160      Microbiology:  No  results found for this or any previous visit (from the past 720 hour(s)).  Coagulation Studies: No results for input(s): LABPROT, INR in the last 72 hours.  Urinalysis: No results for input(s): COLORURINE, LABSPEC, PHURINE, GLUCOSEU, HGBUR, BILIRUBINUR, KETONESUR, PROTEINUR, UROBILINOGEN, NITRITE, LEUKOCYTESUR in the last 72 hours.  Invalid input(s): APPERANCEUR    Imaging: No results found.   Medications:     . acetaminophen  650 mg Oral Once  . amLODipine  10 mg Oral QPM  . aspirin  81 mg Oral Daily  . cholecalciferol  800 Units Oral Daily  . cloNIDine  0.1 mg Oral BID  . divalproex  1,000 mg Oral QHS  . epoetin (EPOGEN/PROCRIT) injection  4,000 Units Intravenous Q M,W,F-HD  . escitalopram  15 mg Oral Daily  . feeding supplement (NEPRO CARB STEADY)  237 mL Oral BID BM  . hydrALAZINE  100 mg Oral TID  . insulin aspart  0-15 Units Subcutaneous TID WC  . insulin aspart  0-5 Units Subcutaneous QHS  . irbesartan  300 mg Oral QHS  . metoprolol tartrate  50 mg Oral BID  . patiromer  8.4 g Oral Q24H  . QUEtiapine  200 mg Oral BID  . traZODone  100 mg Oral QHS   acetaminophen, heparin  Assessment/ Plan:  35  y.o. African-American female with developmental delay, ESRD, AOCD, SHPTH, HTN, schizophrenia, mild cognitive impairment, diabetes mellitus type II  [facility] MWF [address]  1. Hyperkalemia: hemodialysis with 2 K bath  2. Hypertension: elevated  - Amlodipine, clonidine, hydralazine, irbesartan, metoprolol  3. Anemia of chronic kidney disease  - epo with dialysis treatment.   4. Secondary Hyperparathyroidism - currently not on binders  5. End Stage Renal Disease: on hemodialysis MWF schedule.  - working on discharge planning. Discussed case with Social worker: Claudine Bandi LCSW   LOS: 0 Jerrie Gullo 10/6/201710:22 AM

## 2016-08-31 NOTE — ED Notes (Signed)
Pt transported to dialysis via wheelchair by tech.

## 2016-08-31 NOTE — Progress Notes (Signed)
Post HD assessment  

## 2016-08-31 NOTE — Progress Notes (Signed)
Dialysis initiated without complications

## 2016-09-01 LAB — GLUCOSE, CAPILLARY
GLUCOSE-CAPILLARY: 140 mg/dL — AB (ref 65–99)
Glucose-Capillary: 146 mg/dL — ABNORMAL HIGH (ref 65–99)
Glucose-Capillary: 176 mg/dL — ABNORMAL HIGH (ref 65–99)
Glucose-Capillary: 196 mg/dL — ABNORMAL HIGH (ref 65–99)

## 2016-09-01 NOTE — ED Provider Notes (Signed)
-----------------------------------------   7:34 AM on 09/01/2016 -----------------------------------------   Blood pressure 133/77, pulse 89, temperature 99 F (37.2 C), temperature source Oral, resp. rate 18, height 5\' 2"  (1.575 m), weight 175 lb 3.2 oz (79.5 kg), SpO2 100 %.  The patient had no acute events since last update.  Calm and cooperative at this time.  Disposition is pending CSW team recommendations.     Paulette Blanch, MD 09/01/16 [phone]

## 2016-09-02 LAB — GLUCOSE, CAPILLARY
GLUCOSE-CAPILLARY: 190 mg/dL — AB (ref 65–99)
Glucose-Capillary: 168 mg/dL — ABNORMAL HIGH (ref 65–99)
Glucose-Capillary: 125 mg/dL — ABNORMAL HIGH (ref 65–99)
Glucose-Capillary: 192 mg/dL — ABNORMAL HIGH (ref 65–99)

## 2016-09-02 NOTE — ED Notes (Signed)
BEHAVIORAL HEALTH ROUNDING Patient sleeping: Yes.   Patient alert and oriented: not applicable SLEEPING Behavior appropriate: Yes.  ; If no, describe: SLEEPING Nutrition and fluids offered: No SLEEPING Toileting and hygiene offered: NoSLEEPING Sitter present: not applicable, Q 15 min safety rounds and observation. Law enforcement present: Yes ODS 

## 2016-09-02 NOTE — Progress Notes (Signed)
LCSW met with patient briefly and discussed [facility] is working on paperwork and we hope she can go to the group home soon. Gave patient hug and assurances.  Kelly [name] ( future group home provider) is working on the documentation for patient and will provide it to patients [facility]- at  [employer].  Awaiting approval.   Kelly Slipper LCSW [phone]

## 2016-09-02 NOTE — ED Notes (Signed)
Blood Glucose = 168

## 2016-09-02 NOTE — ED Notes (Signed)
BEHAVIORAL HEALTH ROUNDING  Patient sleeping: No.  Patient alert and oriented: yes  Behavior appropriate: Yes. ; If no, describe:  Nutrition and fluids offered: Yes  Toileting and hygiene offered: Yes  Sitter present: not applicable, Q 15 min safety rounds and observation.  Law enforcement present: Yes ODS  

## 2016-09-02 NOTE — ED Notes (Signed)
Pt given rice crispies and nonfat milk at this time. Pt in bed awake watching tv.

## 2016-09-02 NOTE — ED Notes (Signed)
Gave pt clean scrubs to put on. Changed linen in room and gave pt 2 warm blankets.

## 2016-09-02 NOTE — ED Notes (Signed)

## 2016-09-02 NOTE — ED Notes (Signed)
Ms. Mcquitty was given her lunch tray and milk.

## 2016-09-02 NOTE — ED Provider Notes (Signed)
-----------------------------------------   5:58 AM on 09/02/2016 -----------------------------------------   Blood pressure 129/83, pulse 89, temperature 98.6 F (37 C), temperature source Oral, resp. rate 16, height 5\' 2"  (1.575 m), weight 175 lb 3.2 oz (79.5 kg), SpO2 100 %.  The patient had no acute events since last update.  Calm and cooperative at this time.  Disposition is pending Psychiatry/Behavioral Medicine team recommendations.     Orbie Pyo, MD 09/02/16 [phone]

## 2016-09-02 NOTE — ED Notes (Signed)
Pt still refuses medications at this time.

## 2016-09-02 NOTE — ED Notes (Addendum)
Pt refused to take medications and vital signs, told this RN to "leave me alone". This RN educated on medication compliance, and offered to give medicine again pt yelled "NO".

## 2016-09-02 NOTE — ED Notes (Signed)
Pt asking for warm blankets. Pt traded her cool blankets for warm ones.

## 2016-09-03 LAB — GLUCOSE, CAPILLARY
GLUCOSE-CAPILLARY: 159 mg/dL — AB (ref 65–99)
GLUCOSE-CAPILLARY: 194 mg/dL — AB (ref 65–99)
Glucose-Capillary: 256 mg/dL — ABNORMAL HIGH (ref 65–99)
Glucose-Capillary: 67 mg/dL (ref 65–99)

## 2016-09-03 NOTE — ED Notes (Signed)
BEHAVIORAL HEALTH ROUNDING Patient sleeping: No. Patient alert and oriented: yes Behavior appropriate: Yes.  ; If no, describe:  Nutrition and fluids offered: Yes  Toileting and hygiene offered: Yes  Sitter present: not applicable Law enforcement present: Yes  

## 2016-09-03 NOTE — ED Notes (Signed)
ENVIRONMENTAL ASSESSMENT  Potentially harmful objects out of patient reach: Yes.  Personal belongings secured: Yes.  Patient dressed in hospital provided attire only: Yes.  Plastic bags out of patient reach: Yes.  Patient care equipment (cords, cables, call bells, lines, and drains) shortened, removed, or accounted for: Yes.  Equipment and supplies removed from bottom of stretcher: Yes.  Potentially toxic materials out of patient reach: Yes.  Sharps container removed or out of patient reach: Yes.   BEHAVIORAL HEALTH ROUNDING  Patient sleeping: No.  Patient alert and oriented: yes  Behavior appropriate: Yes. ; If no, describe:  Nutrition and fluids offered: Yes  Toileting and hygiene offered: Yes  Sitter present: not applicable, Q 15 min safety rounds and observation.  Law enforcement present: Yes [place]  ED [place]  Is the patient under IVC or is there intent for IVC: No.  Is the patient medically cleared: Yes.  Is there vacancy in the ED BHU: Yes.  Is the population mix appropriate for patient: No. Is the patient awaiting placement in inpatient or outpatient setting: Yes.  Has the patient had a psychiatric consult: Yes.  Survey of unit performed for contraband, proper placement and condition of furniture, tampering with fixtures in bathroom, shower, and each patient room: Yes. ; Findings: All clear  APPEARANCE/BEHAVIOR  calm, cooperative and adequate rapport can be established  NEURO ASSESSMENT  Orientation: time, place and person  Hallucinations: No.None noted (Hallucinations)  Speech: Normal  Gait: normal  RESPIRATORY ASSESSMENT  WNL  CARDIOVASCULAR ASSESSMENT  WNL  GASTROINTESTINAL ASSESSMENT  WNL  EXTREMITIES  WNL  PLAN OF CARE  Provide calm/safe environment. Vital signs assessed twice daily. ED BHU Assessment once each 12-hour shift. Collaborate with TTS/SW daily or as condition indicates. Assure the ED provider has rounded once each shift. Provide  and encourage hygiene. Provide redirection as needed. Assess for escalating behavior; address immediately and inform ED provider.  Assess family dynamic and appropriateness for visitation as needed: Yes. ; If necessary, describe findings:  Educate the patient/family about BHU procedures/visitation: Yes. ; If necessary, describe findings: Pt is calm and cooperative at this time. Pt understanding and accepting of unit procedures/rules. Will continue to monitor with Q 15 min safety rounds and observation.

## 2016-09-03 NOTE — ED Notes (Signed)
Patient in dialysis.

## 2016-09-03 NOTE — ED Notes (Signed)
Patient in shower at this time. 

## 2016-09-03 NOTE — ED Notes (Signed)
BEHAVIORAL HEALTH ROUNDING Patient sleeping: Yes.   Patient alert and oriented: yes Behavior appropriate: Yes.  ; If no, describe:  Nutrition and fluids offered: Yes  Toileting and hygiene offered: Yes  Sitter present: not applicable Law enforcement present: Yes  

## 2016-09-03 NOTE — ED Notes (Signed)
BEHAVIORAL HEALTH ROUNDING  Patient sleeping: No.  Patient alert and oriented: yes  Behavior appropriate: Yes. ; If no, describe:  Nutrition and fluids offered: Yes  Toileting and hygiene offered: Yes  Sitter present: not applicable, Q 15 min safety rounds and observation.  Law enforcement present: Yes ODS  

## 2016-09-03 NOTE — ED Notes (Signed)
BEHAVIORAL HEALTH ROUNDING Patient sleeping: Yes.   Patient alert and oriented: not applicable SLEEPING Behavior appropriate: Yes.  ; If no, describe: SLEEPING Nutrition and fluids offered: No SLEEPING Toileting and hygiene offered: NoSLEEPING Sitter present: not applicable, Q 15 min safety rounds and observation. Law enforcement present: Yes ODS 

## 2016-09-03 NOTE — ED Notes (Signed)
Pt's BG was low so Curly Rim gave apple sauce to the Pt to bring it up. BG of 67.

## 2016-09-03 NOTE — ED Notes (Signed)
Patient assigned to appropriate care area. Patient oriented to unit/care area: Informed that, for their safety, care areas are designed for safety and monitored by security cameras at all times; and visiting hours explained to patient. Patient verbalizes understanding, and verbal contract for safety obtained. 

## 2016-09-03 NOTE — ED Provider Notes (Signed)
-----------------------------------------   7:14 AM on 09/03/2016 -----------------------------------------   Blood pressure (!) 156/81, pulse 94, temperature 98.5 F (36.9 C), temperature source Oral, resp. rate 15, height 5\' 2"  (1.575 m), weight 175 lb 3.2 oz (79.5 kg), SpO2 98 %.  The patient had no acute events since last update.  Calm and cooperative at this time.  Disposition is pending Psychiatry/Behavioral Medicine team recommendations.  Pt has been here for over 2000 hours.  We are assured that everything that can be done for placement is being done.  She is in nad. We have been allowing her accompanied visits outside and doing whatever else we can think of to make her stay here pleasant.  Apparently, per SW report, there are placement issues revolving around dialysis, and the fact that she has 'burned her bridges'   Schuyler Amor, MD 09/03/16 [phone]

## 2016-09-03 NOTE — Progress Notes (Signed)
Pre hd assessment  

## 2016-09-03 NOTE — ED Notes (Signed)

## 2016-09-03 NOTE — Progress Notes (Signed)
End of hd tx  

## 2016-09-03 NOTE — ED Notes (Signed)
PT  VOL/  PENDING  PLACEMENT 

## 2016-09-03 NOTE — Progress Notes (Signed)
Pt HD started at 1110a. Delay in start d/t patient. Upon arrival she refused HD tx and refused to get into HD chair.  After talking with her and suggesting she try HD since she is already here she agreed. Candiss Norse, MD aware.

## 2016-09-03 NOTE — ED Notes (Signed)
Patient still in dialysis .

## 2016-09-03 NOTE — ED Notes (Signed)
Pt agreed to shower at this time. Upon attempting to assist pt to shower, pt stood at shower door watching water run and refused to take a shower. Pt asking for warm blankets and informed that she could get warm blankets once she showered so her bedding could be changed. Pt is not cooperative at this time and angry because she is not getting her way. Pt yelling "shut the hell up,leave me alone."

## 2016-09-03 NOTE — Progress Notes (Signed)
Pre hd tx info 

## 2016-09-03 NOTE — Progress Notes (Signed)
Post hd assessment 

## 2016-09-03 NOTE — Progress Notes (Signed)
Subjective:   Seen and examined on hemodialysis. Potassium high at 5.9 on 10/4 No acute c/o today   Objective:  Vital signs in last 24 hours:  Temp:  [97.9 F (36.6 C)-98.5 F (36.9 C)] 97.9 F (36.6 C) (10/09 1105) Pulse Rate:  [82-107] 107 (10/09 1310) Resp:  [14-20] 14 (10/09 1310) BP: (132-172)/(68-106) 161/84 (10/09 1310) SpO2:  [98 %-100 %] 100 % (10/09 1310) Weight:  [81.1 kg (178 lb 14.4 oz)] 81.1 kg (178 lb 14.4 oz) (10/09 1105)  Weight change:  Filed Weights   08/31/16 0905 08/31/16 1216 09/03/16 1105  Weight: 82.4 kg (181 lb 9.6 oz) 79.5 kg (175 lb 3.2 oz) 81.1 kg (178 lb 14.4 oz)    Intake/Output:   No intake or output data in the 24 hours ending 09/03/16 1344   Physical Exam: General: No acute distress, sitting in dialysis chair  HEENT [place]/AT hearing intact  Neck Supple   Pulm/lungs Normal breathing effort, clear to auscultation   CVS/Heart Regular rhythm, no rub or gallop, Systolic murmur present  Abdomen:  Soft, nontender, nondistended   Extremities: No peripheral edema   Neurologic: Awake, alert, following commands  Skin: No acute rashes   Access: Left upper arm AV fistula        Basic Metabolic Panel:   Recent Labs Lab 08/29/16 0930 08/29/16 0934  NA  --  134*  K  --  5.9*  CL  --  93*  CO2  --  25  GLUCOSE  --  177*  BUN  --  61*  CREATININE  --  8.56*  CALCIUM  --  9.5  PHOS 4.7* 4.7*     CBC:  Recent Labs Lab 08/29/16 0930  WBC 4.5  HGB 10.0*  HCT 29.8*  MCV 95.0  PLT 160      Microbiology:  No results found for this or any previous visit (from the past 720 hour(s)).  Coagulation Studies: No results for input(s): LABPROT, INR in the last 72 hours.  Urinalysis: No results for input(s): COLORURINE, LABSPEC, PHURINE, GLUCOSEU, HGBUR, BILIRUBINUR, KETONESUR, PROTEINUR, UROBILINOGEN, NITRITE, LEUKOCYTESUR in the last 72 hours.  Invalid input(s): APPERANCEUR    Imaging: No results found.   Medications:      . acetaminophen  650 mg Oral Once  . amLODipine  10 mg Oral QPM  . aspirin  81 mg Oral Daily  . cholecalciferol  800 Units Oral Daily  . cloNIDine  0.1 mg Oral BID  . divalproex  1,000 mg Oral QHS  . epoetin (EPOGEN/PROCRIT) injection  4,000 Units Intravenous Q M,W,F-HD  . escitalopram  15 mg Oral Daily  . feeding supplement (NEPRO CARB STEADY)  237 mL Oral BID BM  . hydrALAZINE  100 mg Oral TID  . insulin aspart  0-15 Units Subcutaneous TID WC  . insulin aspart  0-5 Units Subcutaneous QHS  . irbesartan  300 mg Oral QHS  . metoprolol tartrate  50 mg Oral BID  . patiromer  8.4 g Oral Q24H  . QUEtiapine  200 mg Oral BID  . traZODone  100 mg Oral QHS   acetaminophen, heparin  Assessment/ Plan:  35 y.o. African-American female with developmental delay, ESRD, AOCD, SHPTH, HTN, schizophrenia, mild cognitive impairment, diabetes mellitus type II  [facility] MWF [address]  1. Hyperkalemia:  hemodialysis with 2 K bath Next HD Wednesday  2. Hypertension:  - Amlodipine, clonidine, hydralazine, irbesartan, metoprolol  3. Anemia of chronic kidney disease  - epo with dialysis treatment.  4. Secondary Hyperparathyroidism - currently not on binders  5. End Stage Renal Disease: on hemodialysis MWF schedule.  - Care management team is working on discharge planning.     LOS: 0 Kelly [name] 10/9/20171:44 PM

## 2016-09-03 NOTE — Progress Notes (Signed)
Hd tx start

## 2016-09-04 LAB — GLUCOSE, CAPILLARY
GLUCOSE-CAPILLARY: 160 mg/dL — AB (ref 65–99)
GLUCOSE-CAPILLARY: 240 mg/dL — AB (ref 65–99)
GLUCOSE-CAPILLARY: 118 mg/dL — AB (ref 65–99)
Glucose-Capillary: 194 mg/dL — ABNORMAL HIGH (ref 65–99)

## 2016-09-04 NOTE — Progress Notes (Signed)
CSW called [employer] ([employer] director) [phone]. Ms. Watlington states that she is completing the paperwork and will submit it today.   [name], MSW, [place]

## 2016-09-04 NOTE — ED Provider Notes (Signed)
-----------------------------------------   6:39 AM on 09/04/2016 -----------------------------------------   Blood pressure (!) 156/84, pulse 89, temperature 98.7 F (37.1 C), temperature source Oral, resp. rate 20, height 5\' 2"  (1.575 m), weight 178 lb 14.4 oz (81.1 kg), SpO2 98 %.  The patient had no acute events since last update.  Calm and cooperative at this time.  I am told she should be going to a group home in [place] sometime this week per clinical social work. Patient will have dialysis today.   Paulette Blanch, MD 09/04/16 [phone]

## 2016-09-04 NOTE — Progress Notes (Addendum)
Late Entry: CSW took pt outside on 10/9 around 3:30pm. CSW explained to pt that the paperwork required for her group home placement is very extensive and is delaying the process of her discharge. CSW explained that Ms. Oswaldo Milian is working hard on the paperwork and hopefully it will be completed soon but it could take more than a week. Pt expressed her understanding. CSW spoke with pt about coping skills that she has utilizes while in the ED and how those coping skills will also be helpful to her a her new placement. Pt states that overall she is happy and looking forward to discharge. CSW provided supportive counseling.  CSW called Scarlette Shorts (Care Coordinator) [phone]. Tsosie Billing states that there has been a delay in the paperwork but she is working as hard as she can to get it submitted and approved. CSW thanked [facility] for her efforts.  [name], MSW, [place]

## 2016-09-04 NOTE — Progress Notes (Signed)
CSW took pt outside. Pt states she is feeling well and is an overall happy mood. CSW spent some time encouraging pt and providing supportive counseling. Pt was receptive to CSW's efforts. CSW will continue to follow pt and assist as needed.  [name], MSW, [place]

## 2016-09-05 LAB — GLUCOSE, CAPILLARY
Glucose-Capillary: 164 mg/dL — ABNORMAL HIGH (ref 65–99)
Glucose-Capillary: 200 mg/dL — ABNORMAL HIGH (ref 65–99)
Glucose-Capillary: 211 mg/dL — ABNORMAL HIGH (ref 65–99)
Glucose-Capillary: 99 mg/dL (ref 65–99)

## 2016-09-05 NOTE — Progress Notes (Signed)
End of tx

## 2016-09-05 NOTE — ED Notes (Signed)
VOL/Pending placement 

## 2016-09-05 NOTE — Progress Notes (Signed)
Pt ended HD tx AMA 30 minutes early. States she is hungry and wants her lunch. MD notified.

## 2016-09-05 NOTE — Progress Notes (Signed)
Pre hd assessment  

## 2016-09-05 NOTE — Progress Notes (Signed)
CSW took pt outside. Pt was in an upbeat mood and showed CSW several things in a magazine that she was interested in buying upon d/c. CSW thanked pt again for her continued pt and encouraged her to stay positive.  CSW called Ms. Watlington [phone]. Ms. Watlington states that she submitted the single case agreement today but there is one portion of it that she left blank. She states she will get help with the final section tomorrow and then submit that as well.  CSW will continue to follow pt and assist as needed.   [name], MSW, [place]

## 2016-09-05 NOTE — ED Provider Notes (Signed)
-----------------------------------------   7:39 AM on 09/05/2016 -----------------------------------------  Vitals:   09/04/16 1428 09/05/16 0636  BP: (!) 126/55 124/84  Pulse: 80 90  Resp: 18 18  Temp:  98.6 F (37 C)   No acute events reported to me overnight. Patient remains IVC and is awaiting social work disposition.   Lisa Roca, MD 09/05/16 [phone]

## 2016-09-05 NOTE — ED Notes (Signed)
Patient transported to dialysis unit.

## 2016-09-05 NOTE — ED Notes (Signed)
PT  VOL/  PENDING  PLACEMENT 

## 2016-09-05 NOTE — Progress Notes (Signed)
Hd start 

## 2016-09-05 NOTE — Progress Notes (Signed)
Post hd vitals 

## 2016-09-05 NOTE — ED Notes (Signed)
Pt taken outside per CSW

## 2016-09-05 NOTE — Progress Notes (Signed)
Post hd assessment 

## 2016-09-05 NOTE — Progress Notes (Signed)
Subjective:   Seen and examined on hemodialysis. Potassium high at 5.9 on 10/4 No acute c/o today Face appears swollen   Objective:  Vital signs in last 24 hours:  Temp:  [98.6 F (37 C)-99 F (37.2 C)] 99 F (37.2 C) (10/11 0940) Pulse Rate:  [80-109] 109 (10/11 1015) Resp:  [13-23] 23 (10/11 1015) BP: (124-170)/(55-118) 153/77 (10/11 1015) SpO2:  [100 %] 100 % (10/11 1015) Weight:  [79.3 kg (174 lb 12.8 oz)-83.2 kg (183 lb 8 oz)] 79.3 kg (174 lb 12.8 oz) (10/11 0940)  Weight change: 2.087 kg (4 lb 9.6 oz) Filed Weights   09/03/16 1105 09/05/16 0700 09/05/16 0940  Weight: 81.1 kg (178 lb 14.4 oz) 83.2 kg (183 lb 8 oz) 79.3 kg (174 lb 12.8 oz)    Intake/Output:   No intake or output data in the 24 hours ending 09/05/16 1041   Physical Exam: General: No acute distress, sitting in dialysis chair  HEENT [place]/AT hearing intact, face appears swollen today  Neck Supple   Pulm/lungs Normal breathing effort, clear to auscultation   CVS/Heart Regular rhythm, no rub or gallop, Systolic murmur present  Abdomen:  Soft, nontender, nondistended   Extremities: No peripheral edema   Neurologic: Awake, alert, following commands  Skin: No acute rashes   Access: Left upper arm AV fistula        Basic Metabolic Panel:  No results for input(s): NA, K, CL, CO2, GLUCOSE, BUN, CREATININE, CALCIUM, MG, PHOS in the last 168 hours.   CBC: No results for input(s): WBC, NEUTROABS, HGB, HCT, MCV, PLT in the last 168 hours.    Microbiology:  No results found for this or any previous visit (from the past 720 hour(s)).  Coagulation Studies: No results for input(s): LABPROT, INR in the last 72 hours.  Urinalysis: No results for input(s): COLORURINE, LABSPEC, PHURINE, GLUCOSEU, HGBUR, BILIRUBINUR, KETONESUR, PROTEINUR, UROBILINOGEN, NITRITE, LEUKOCYTESUR in the last 72 hours.  Invalid input(s): APPERANCEUR    Imaging: No results found.   Medications:     . acetaminophen  650  mg Oral Once  . amLODipine  10 mg Oral QPM  . aspirin  81 mg Oral Daily  . cholecalciferol  800 Units Oral Daily  . cloNIDine  0.1 mg Oral BID  . divalproex  1,000 mg Oral QHS  . epoetin (EPOGEN/PROCRIT) injection  4,000 Units Intravenous Q M,W,F-HD  . escitalopram  15 mg Oral Daily  . feeding supplement (NEPRO CARB STEADY)  237 mL Oral BID BM  . hydrALAZINE  100 mg Oral TID  . insulin aspart  0-15 Units Subcutaneous TID WC  . insulin aspart  0-5 Units Subcutaneous QHS  . irbesartan  300 mg Oral QHS  . metoprolol tartrate  50 mg Oral BID  . patiromer  8.4 g Oral Q24H  . QUEtiapine  200 mg Oral BID  . traZODone  100 mg Oral QHS   acetaminophen, heparin  Assessment/ Plan:  35 y.o. African-American female with developmental delay, ESRD, AOCD, SHPTH, HTN, schizophrenia, mild cognitive impairment, diabetes mellitus type II  [facility] MWF [address]  1. Hyperkalemia:  hemodialysis with 2 K bath Next HD Friday Labs prior to dialysis UF goal 4000 cc today Pre-weight 79 kg   2. Hypertension:  - Amlodipine, clonidine, hydralazine, irbesartan, metoprolol - Preferably dose once daily meds in the evening after dialysis  3. Anemia of chronic kidney disease  - epo with dialysis treatment.   4. Secondary Hyperparathyroidism - currently not on binders  5. End Stage Renal Disease: on hemodialysis MWF schedule.  - Care management team is working on discharge planning.    6. DM-2 with CKD Insulin dependent Pre-meal Novolog increased to 10 TID as per diabetes team recommendations   LOS: 0 Kelly [name] 10/11/201710:41 AM

## 2016-09-05 NOTE — Progress Notes (Signed)
Pre hd info 

## 2016-09-06 LAB — GLUCOSE, CAPILLARY
GLUCOSE-CAPILLARY: 126 mg/dL — AB (ref 65–99)
GLUCOSE-CAPILLARY: 112 mg/dL — AB (ref 65–99)
GLUCOSE-CAPILLARY: 71 mg/dL (ref 65–99)
GLUCOSE-CAPILLARY: 232 mg/dL — AB (ref 65–99)
Glucose-Capillary: 211 mg/dL — ABNORMAL HIGH (ref 65–99)

## 2016-09-06 NOTE — ED Notes (Signed)
Report given to Bryan,RN.

## 2016-09-06 NOTE — ED Notes (Signed)
Pt given breakfast tray

## 2016-09-06 NOTE — ED Notes (Signed)
Pt sleeping, attempted to awake pt , pt refusing at current time to take medications

## 2016-09-06 NOTE — ED Notes (Signed)
CBG 126. Pt sleeping

## 2016-09-06 NOTE — ED Notes (Signed)
BEHAVIORAL HEALTH ROUNDING Patient sleeping: YES Patient alert and oriented: NO Behavior appropriate: YES Describe behavior: No inappropriate or unacceptable behaviors noted at this time.  Nutrition and fluids offered: YES Toileting and hygiene offered: YES Sitter present: Industrial/product designer rounding every 15 minutes on patient to ensure safety; Christine. Law enforcement present: Programmer, applications agency: [place] (ODS); [place].

## 2016-09-06 NOTE — ED Notes (Signed)

## 2016-09-06 NOTE — ED Notes (Signed)
Pt given lunch tray.

## 2016-09-06 NOTE — ED Notes (Signed)
Pt sleeping. Nepro placed at bedside

## 2016-09-06 NOTE — ED Provider Notes (Signed)
-----------------------------------------   5:53 AM on 09/06/2016 -----------------------------------------   Blood pressure (!) 155/85, pulse (!) 105, temperature 98.7 F (37.1 C), temperature source Oral, resp. rate 18, height 5\' 2"  (1.575 m), weight 174 lb 2.6 oz (79 kg), SpO2 99 %.  The patient had no acute events since last update.  Calm and cooperative at this time.  Disposition is pending per Psychiatry/Behavioral Medicine team recommendations.     Rudene Re, MD 09/06/16 [phone]

## 2016-09-06 NOTE — ED Notes (Signed)
This RN notified [employer] Pam, EDT that patient's CBG 71. Pt remains asymptomatic at this time, MD notified, pt given 220mL of apple juice.

## 2016-09-06 NOTE — ED Notes (Signed)
PT VOLUNTARY PENDING PLACEMENT.

## 2016-09-07 LAB — CBC
HCT: 28.8 % — ABNORMAL LOW (ref 35.0–47.0)
HEMOGLOBIN: 9.9 g/dL — AB (ref 12.0–16.0)
MCH: 32.6 pg (ref 26.0–34.0)
MCHC: 34.5 g/dL (ref 32.0–36.0)
MCV: 94.3 fL (ref 80.0–100.0)
Platelets: 131 10*3/uL — ABNORMAL LOW (ref 150–440)
RBC: 3.05 MIL/uL — ABNORMAL LOW (ref 3.80–5.20)
RDW: 14.2 % (ref 11.5–14.5)
WBC: 4.4 10*3/uL (ref 3.6–11.0)

## 2016-09-07 LAB — RENAL FUNCTION PANEL
ALBUMIN: 3.7 g/dL (ref 3.5–5.0)
ANION GAP: 15 (ref 5–15)
BUN: 102 mg/dL — ABNORMAL HIGH (ref 6–20)
CALCIUM: 9.7 mg/dL (ref 8.9–10.3)
CO2: 27 mmol/L (ref 22–32)
Chloride: 92 mmol/L — ABNORMAL LOW (ref 101–111)
Creatinine, Ser: 9.92 mg/dL — ABNORMAL HIGH (ref 0.44–1.00)
GFR calc non Af Amer: 5 mL/min — ABNORMAL LOW (ref >60)
GFR, EST AFRICAN AMERICAN: 5 mL/min — AB (ref >60)
GLUCOSE: 257 mg/dL — AB (ref 65–99)
PHOSPHORUS: 4.1 mg/dL (ref 2.5–4.6)
Potassium: 5.2 mmol/L — ABNORMAL HIGH (ref 3.5–5.1)
SODIUM: 134 mmol/L — AB (ref 135–145)

## 2016-09-07 LAB — GLUCOSE, CAPILLARY
GLUCOSE-CAPILLARY: 252 mg/dL — AB (ref 65–99)
Glucose-Capillary: 235 mg/dL — ABNORMAL HIGH (ref 65–99)
Glucose-Capillary: 384 mg/dL — ABNORMAL HIGH (ref 65–99)

## 2016-09-07 NOTE — ED Notes (Signed)
BEHAVIORAL HEALTH ROUNDING Patient sleeping: No. Patient alert and oriented: yes Behavior appropriate: Yes.  ; If no, describe:  Nutrition and fluids offered: Yes  Toileting and hygiene offered: Yes  Sitter present: q 15 min checks Law enforcement present: Yes  

## 2016-09-07 NOTE — ED Notes (Signed)

## 2016-09-07 NOTE — ED Notes (Signed)
Patient transported to dialysis by wheelchair.

## 2016-09-07 NOTE — Progress Notes (Signed)
Post hd assessment 

## 2016-09-07 NOTE — Progress Notes (Signed)
Post hd vitals 

## 2016-09-07 NOTE — Progress Notes (Signed)
LCSW checked in with ED nurse Vicente Males and patient  Does have group home awaiting [facility] authorization, hopeful patient will be relacting early next week.  LCSW called and left messages for [facility]  [facility] for further information on their progress to assist this patients placement and documentation.Looking good for single case agreement- process is almost complete and group home is being  Is being reviewed today and we will be advised if approved.   [employer] LCSW [phone]

## 2016-09-07 NOTE — ED Provider Notes (Signed)
-----------------------------------------   6:49 AM on 09/07/2016 -----------------------------------------   Blood pressure 139/88, pulse 91, temperature 98.1 F (36.7 C), temperature source Oral, resp. rate 20, height 5\' 2"  (1.575 m), weight 174 lb 2.6 oz (79 kg), SpO2 98 %.  The patient had no acute events since last update.  Calm and cooperative at this time. Still awaiting placement. She is due for dialysis today.     Joanne Gavel, MD 09/07/16 [phone]

## 2016-09-07 NOTE — Consult Note (Signed)
  Psychiatry: Brief follow-up. Patient has no new complaints. She seems to be in good spirits this afternoon. I am told by social work that there really is a plan of what that may result in her being placed by next week. This is great news. No indication to change any psychiatric treatment

## 2016-09-07 NOTE — ED Notes (Signed)
Per dialysis, Patient's blood pressure after dialysis was 118/71, pulse was 100 and respirations 19.  Patient tolerated dialysis well.

## 2016-09-07 NOTE — Progress Notes (Signed)
Pre hd info 

## 2016-09-07 NOTE — ED Notes (Signed)
BEHAVIORAL HEALTH ROUNDING Patient sleeping: YES Patient alert and oriented: Sleeping Behavior appropriate: Sleeping Describe behavior: No inappropriate or unacceptable behaviors noted at this time.  Nutrition and fluids offered: Sleeping Toileting and hygiene offered: Sleeping Sitter present: Behavioral tech rounding every 15 minutes on patient to ensure safety; Christine.  Law enforcement present: Programmer, applications agency: [place] (ODS); [place].

## 2016-09-07 NOTE — Progress Notes (Signed)
Pre hd assessment  

## 2016-09-07 NOTE — Progress Notes (Signed)
  End of hd 

## 2016-09-07 NOTE — Progress Notes (Signed)
Subjective:   Seen and examined on hemodialysis. Potassium high at 5.2  No acute c/o today. Asking for food. States she does not like lunch provided by the hospital Patient seen during dialysis Tolerating well    HEMODIALYSIS FLOWSHEET:  Blood Flow Rate (mL/min): 400 mL/min Arterial Pressure (mmHg): -190 mmHg Venous Pressure (mmHg): 190 mmHg Transmembrane Pressure (mmHg): 40 mmHg Ultrafiltration Rate (mL/min): 1000 mL/min Dialysate Flow Rate (mL/min): 800 ml/min Conductivity: Machine : 13.9 Conductivity: Machine : 13.9 Dialysis Fluid Bolus: Normal Saline Bolus Amount (mL): 250 mL (prime) Dialysate Change:  (3k) Intra-Hemodialysis Comments: 2290. pt alert, no c/o, vss.       Objective:  Vital signs in last 24 hours:  Temp:  [97.5 F (36.4 C)-98.6 F (37 C)] 97.5 F (36.4 C) (10/13 1445) Pulse Rate:  [90-96] 94 (10/13 1700) Resp:  [11-23] 15 (10/13 1700) BP: (121-175)/(60-88) 126/73 (10/13 1700) SpO2:  [98 %-100 %] 100 % (10/13 1700) Weight:  [82.7 kg (182 lb 5.1 oz)] 82.7 kg (182 lb 5.1 oz) (10/13 1445)  Weight change:  Filed Weights   09/05/16 0940 09/05/16 1248 09/07/16 1445  Weight: 82.5 kg (181 lb 14.1 oz) 79 kg (174 lb 2.6 oz) 82.7 kg (182 lb 5.1 oz)    Intake/Output:    Intake/Output Summary (Last 24 hours) at 09/07/16 1727 Last data filed at 09/07/16 1245  Gross per 24 hour  Intake              480 ml  Output                0 ml  Net              480 ml     Physical Exam: General: No acute distress, sitting in dialysis chair  HEENT [place]/AT hearing intact, face appears swollen today  Neck Supple   Pulm/lungs Normal breathing effort, clear to auscultation   CVS/Heart Regular rhythm, no rub or gallop, Systolic murmur present  Abdomen:  Soft, nontender, nondistended   Extremities: No peripheral edema   Neurologic: Awake, alert, following commands  Skin: No acute rashes   Access: Left upper arm AV fistula        Basic Metabolic Panel:   Recent  Labs Lab 09/07/16 1450  NA 134*  K 5.2*  CL 92*  CO2 27  GLUCOSE 257*  BUN 102*  CREATININE 9.92*  CALCIUM 9.7  PHOS 4.1     CBC:  Recent Labs Lab 09/07/16 1450  WBC 4.4  HGB 9.9*  HCT 28.8*  MCV 94.3  PLT 131*      Microbiology:  No results found for this or any previous visit (from the past 720 hour(s)).  Coagulation Studies: No results for input(s): LABPROT, INR in the last 72 hours.  Urinalysis: No results for input(s): COLORURINE, LABSPEC, PHURINE, GLUCOSEU, HGBUR, BILIRUBINUR, KETONESUR, PROTEINUR, UROBILINOGEN, NITRITE, LEUKOCYTESUR in the last 72 hours.  Invalid input(s): APPERANCEUR    Imaging: No results found.   Medications:     . acetaminophen  650 mg Oral Once  . amLODipine  10 mg Oral QPM  . aspirin  81 mg Oral Daily  . cholecalciferol  800 Units Oral Daily  . cloNIDine  0.1 mg Oral BID  . divalproex  1,000 mg Oral QHS  . epoetin (EPOGEN/PROCRIT) injection  4,000 Units Intravenous Q M,W,F-HD  . escitalopram  15 mg Oral Daily  . feeding supplement (NEPRO CARB STEADY)  237 mL Oral BID BM  . hydrALAZINE  100  mg Oral TID  . insulin aspart  0-15 Units Subcutaneous TID WC  . insulin aspart  0-5 Units Subcutaneous QHS  . irbesartan  300 mg Oral QHS  . metoprolol tartrate  50 mg Oral BID  . patiromer  8.4 g Oral Q24H  . QUEtiapine  200 mg Oral BID  . traZODone  100 mg Oral QHS   acetaminophen, heparin  Assessment/ Plan:  35 y.o. African-American female with developmental delay, ESRD, AOCD, SHPTH, HTN, schizophrenia, mild cognitive impairment, diabetes mellitus type II  [facility] MWF [address]  1. Hyperkalemia:  hemodialysis with 2 K bath Next HD Monday Labs prior to dialysis every Monday UF goal 01-3999 cc today; as tolerated Dry weight 79 kg   2. Hypertension:  - Amlodipine, clonidine, hydralazine, irbesartan, metoprolol - Preferably dose once daily meds in the evening after dialysis  3. Anemia of chronic kidney disease   - epo with dialysis treatment.   4. Secondary Hyperparathyroidism - currently not on binders  5. End Stage Renal Disease: on hemodialysis MWF schedule.  - Care management team is working on discharge planning.    6. DM-2 with CKD Insulin dependent Pre-meal Novolog increased to 10 TID as per diabetes team recommendations   LOS: 0 Juriel Cid 10/13/20175:27 PM

## 2016-09-07 NOTE — Progress Notes (Signed)
Hd tx start

## 2016-09-07 NOTE — ED Notes (Signed)

## 2016-09-07 NOTE — ED Notes (Signed)
Patient returned from dialysis.

## 2016-09-08 LAB — GLUCOSE, CAPILLARY
GLUCOSE-CAPILLARY: 253 mg/dL — AB (ref 65–99)
GLUCOSE-CAPILLARY: 203 mg/dL — AB (ref 65–99)
GLUCOSE-CAPILLARY: 224 mg/dL — AB (ref 65–99)
Glucose-Capillary: 108 mg/dL — ABNORMAL HIGH (ref 65–99)

## 2016-09-08 NOTE — ED Notes (Signed)
Offered shower, pt refused

## 2016-09-08 NOTE — Progress Notes (Addendum)
Received a call late last night from Ms Kelly [name] reporting she was unable to send off PCP and the requested documents to [facility] due to illness . LCSW advised  Ms Kelly [name] to respond and explain her situation to the email from [place] called [name] and left detailed message   Ms Kelly [name]- notified LCSW that she did send an e-mail back explaining why she was late with submitting documentation (  Due to her illness) She will be coming today to have her Plan of care documentation reviewed and signed and then will fax this off to the [employer] [place]. Ms Kelly [name] is fearfull because she did not submit these documents within the 3 day window. She is concerned it may take another 7 days for approval.   Enis Slipper LCSW [phone]

## 2016-09-08 NOTE — ED Notes (Signed)
PT REMAINS VOL W/ PLACEMENT PENDING

## 2016-09-08 NOTE — ED Provider Notes (Signed)
-----------------------------------------   7:34 AM on 09/08/2016 -----------------------------------------   Blood pressure (!) 148/77, pulse 89, temperature 98.2 F (36.8 C), resp. rate 18, height 5\' 2"  (1.575 m), weight 78.4 kg, SpO2 100 %.  The patient had no acute events since last update.  Calm and cooperative at this time.  Placement pending.    Hinda Kehr, MD 09/08/16 [phone]

## 2016-09-08 NOTE — ED Notes (Signed)
ENVIRONMENTAL ASSESSMENT  Potentially harmful objects out of patient reach: Yes.  Personal belongings secured: Yes.  Patient dressed in hospital provided attire only: Yes.  Plastic bags out of patient reach: Yes.  Patient care equipment (cords, cables, call bells, lines, and drains) shortened, removed, or accounted for: Yes.  Equipment and supplies removed from bottom of stretcher: Yes.  Potentially toxic materials out of patient reach: Yes.  Sharps container removed or out of patient reach: Yes.   BEHAVIORAL HEALTH ROUNDING  Patient sleeping: No.  Patient alert and oriented: yes  Behavior appropriate: Yes. ; If no, describe:  Nutrition and fluids offered: Yes  Toileting and hygiene offered: Yes  Sitter present: not applicable, Q 15 min safety rounds and observation.  Law enforcement present: Yes ODS   APPEARANCE/BEHAVIOR  calm, cooperative and adequate rapport can be established  NEURO ASSESSMENT  Orientation: time, place and person  Hallucinations: No.None noted (Hallucinations)  Speech: Normal  Gait: normal  RESPIRATORY ASSESSMENT  WNL  CARDIOVASCULAR ASSESSMENT  WNL  GASTROINTESTINAL ASSESSMENT  WNL  EXTREMITIES  ROM of all joints is normal  PLAN OF CARE  Provide calm/safe environment. Vital signs assessed twice daily. ED BHU Assessment once each 12-hour shift. Collaborate with TTS or SW daily or as condition indicates. Assure the ED provider has rounded once each shift. Provide and encourage hygiene. Provide redirection as needed. Assess for escalating behavior; address immediately and inform ED provider.  Assess family dynamic and appropriateness for visitation as needed: Yes. ; If necessary, describe findings:  Educate the patient/family about BHU procedures/visitation: Yes. ; If necessary, describe findings:

## 2016-09-08 NOTE — ED Notes (Signed)
BEHAVIORAL HEALTH ROUNDING  Patient sleeping: No.  Patient alert and oriented: yes  Behavior appropriate: Yes. ; If no, describe:  Nutrition and fluids offered: Yes  Toileting and hygiene offered: Yes  Sitter present: not applicable, Q 15 min safety rounds and observation.  Law enforcement present: Yes ODS  

## 2016-09-08 NOTE — ED Notes (Signed)
Lunch at bedside 

## 2016-09-09 LAB — GLUCOSE, CAPILLARY
GLUCOSE-CAPILLARY: 145 mg/dL — AB (ref 65–99)
Glucose-Capillary: 143 mg/dL — ABNORMAL HIGH (ref 65–99)
Glucose-Capillary: 272 mg/dL — ABNORMAL HIGH (ref 65–99)

## 2016-09-09 NOTE — ED Notes (Signed)
BEHAVIORAL HEALTH ROUNDING Patient sleeping: Yes.   Patient alert and oriented: eyes closed  Appears to be asleep Behavior appropriate: Yes.  ; If no, describe:  Nutrition and fluids offered: Yes  Toileting and hygiene offered: sleeping Sitter present: q 15 minute observations and security monitoring Law enforcement present: yes  ODS  Warm blanket provided

## 2016-09-09 NOTE — ED Notes (Signed)
BEHAVIORAL HEALTH ROUNDING  Patient sleeping: No.  Patient alert and oriented: yes  Behavior appropriate: Yes. ; If no, describe:  Nutrition and fluids offered: Yes  Toileting and hygiene offered: Yes  Sitter present: not applicable, Q 15 min safety rounds and observation.  Law enforcement present: Yes ODS  

## 2016-09-09 NOTE — ED Notes (Signed)
BEHAVIORAL HEALTH ROUNDING Patient sleeping: Yes.   Patient alert and oriented: not applicable SLEEPING Behavior appropriate: Yes.  ; If no, describe: SLEEPING Nutrition and fluids offered: No SLEEPING Toileting and hygiene offered: NoSLEEPING Sitter present: not applicable, Q 15 min safety rounds and observation. Law enforcement present: Yes ODS 

## 2016-09-09 NOTE — ED Notes (Signed)

## 2016-09-09 NOTE — Progress Notes (Signed)
LCSW met with Kelly [name] and reviewed patients plan of care and other documentation that needed to be submitted to Utilization manager. We went over entire patients booklet and Kelly [name] was supported and will fax all documentation over to [employer] for signatures/approval.  LCSW and patient and Mr and Mrs [name] went to have a brief visit with Floria and she was introduced to her future group home providers.  Plan for Sunday: Vonda Thomas to sign off on Plan of care, Kelly [name] will make changes to her budget to reflect ( nepro shakes ( Renal diet) for this patient. Kelly [name] will submit all completed documentation to [facility] utilization manager on Monday for review. We are hoping once approved patient will be residing at her new group home within a week.   Jettie Lazare LCSW [phone] 

## 2016-09-09 NOTE — ED Notes (Signed)
BS 145

## 2016-09-09 NOTE — ED Provider Notes (Signed)
-----------------------------------------   6:54 AM on 09/09/2016 -----------------------------------------   Blood pressure 136/88, pulse 88, temperature 98.4 F (36.9 C), temperature source Oral, resp. rate 18, height 5\' 2"  (1.575 m), weight 172 lb 13.5 oz (78.4 kg), SpO2 100 %.  The patient had no acute events since last update.  Calm and cooperative at this time.  Disposition is pending Psychiatry/Behavioral Medicine team recommendations.     Joanne Gavel, MD 09/09/16 [phone]

## 2016-09-09 NOTE — ED Notes (Signed)
ED BHU [place] Is the patient under IVC or is there intent for IVC:  No   Is the patient medically cleared: Yes.   Is there vacancy in the ED BHU: Yes.   Is the population mix appropriate for patient: Yes.   Is the patient awaiting placement in inpatient or outpatient setting:  Yes - group home placement  Has the patient had a psychiatric consult: Yes.   Survey of unit performed for contraband, proper placement and condition of furniture, tampering with fixtures in bathroom, shower, and each patient room: Yes.  ; Findings:  APPEARANCE/BEHAVIOR Calm and cooperative NEURO ASSESSMENT Orientation: oriented x4  Denies pain Hallucinations: No.None noted (Hallucinations) Speech: Normal Gait: normal RESPIRATORY ASSESSMENT Even  Unlabored respirations  CARDIOVASCULAR ASSESSMENT Pulses equal   regular rate  Skin warm and dry   GASTROINTESTINAL ASSESSMENT no GI complaint EXTREMITIES Full ROM  PLAN OF CARE Provide calm/safe environment. Vital signs assessed twice daily. ED BHU Assessment once each 12-hour shift. Collaborate with TTS daily or as condition indicates. Assure the ED provider has rounded once each shift. Provide and encourage hygiene. Provide redirection as needed. Assess for escalating behavior; address immediately and inform ED provider.  Assess family dynamic and appropriateness for visitation as needed: Yes.  ; If necessary, describe findings:  Educate the patient/family about BHU procedures/visitation: Yes.  ; If necessary, describe findings:

## 2016-09-09 NOTE — ED Notes (Signed)
Pt ambulatory around treatment area without difficulty.

## 2016-09-09 NOTE — ED Notes (Signed)
Report received from [facility]  Patient observed lying in bed with eyes closed  Even, unlabored respirations observed   NAD pt appears to be sleeping  I will continue to monitor along with every 15 minute visual observations and ongoing security monitoring

## 2016-09-09 NOTE — ED Notes (Signed)
BEHAVIORAL HEALTH ROUNDING Patient sleeping: No. Patient alert and oriented: yes Behavior appropriate: Yes.  ; If no, describe:  Nutrition and fluids offered: yes Toileting and hygiene offered: Yes  Sitter present: q15 minute observations and security  monitoring Law enforcement present: Yes  ODS  

## 2016-09-09 NOTE — Progress Notes (Signed)
LCSW met with Kelly [name] and reviewed patients plan of care and other documentation that needed to be submitted to Utilization manager. We went over entire patients booklet and Kelly [name] was supported and will fax all documentation over to [employer] for signatures/approval.  LCSW and patient and Mr and Mrs Kelly [name] went to have a brief visit with Kelly [name] and she was introduced to her future group home providers.  Plan for Sunday: Kelly [name] to sign off on Plan of care, Kelly [name] will make changes to her budget to reflect ( nepro shakes ( Renal diet) for this patient. Kelly [name] will submit all completed documentation to [facility] on Monday for review. We are hoping once approved patient will be residing at her new group home within a week.   [employer] LCSW [phone]

## 2016-09-09 NOTE — ED Notes (Signed)
Patient given raisin bran and milk for snack

## 2016-09-09 NOTE — ED Notes (Signed)
BEHAVIORAL HEALTH ROUNDING Patient sleeping: No. Patient alert and oriented: yes Behavior appropriate: Yes.  ; If no, describe:  Nutrition and fluids offered: yes Toileting and hygiene offered: Yes  Sitter present: q15 minute observations and security monitoring Law enforcement present: Yes  ODS  Am meds administered as ordered  Pt has been sleeping

## 2016-09-09 NOTE — ED Notes (Signed)
BEHAVIORAL HEALTH ROUNDING Patient sleeping: Yes.   Patient alert and oriented: eyes closed  Appears to be asleep Behavior appropriate: Yes.  ; If no, describe:  Nutrition and fluids offered: Yes  Toileting and hygiene offered: sleeping Sitter present: q 15 minute observations and security monitoring Law enforcement present: yes  ODS 

## 2016-09-10 ENCOUNTER — Encounter
Admission: EM | Disposition: A | Discharge status: Home / Self Care | Payer: Self-pay | Source: Home / Self Care | Attending: Emergency Medicine

## 2016-09-10 DIAGNOSIS — N186 End stage renal disease: Secondary | ICD-10-CM | POA: Diagnosis not present

## 2016-09-10 HISTORY — PX: PERIPHERAL VASCULAR CATHETERIZATION: SHX172C

## 2016-09-10 LAB — RENAL FUNCTION PANEL
ALBUMIN: 3.9 g/dL (ref 3.5–5.0)
Anion gap: 15 (ref 5–15)
BUN: 99 mg/dL — AB (ref 6–20)
CO2: 26 mmol/L (ref 22–32)
CREATININE: 10.9 mg/dL — AB (ref 0.44–1.00)
Calcium: 9.9 mg/dL (ref 8.9–10.3)
Chloride: 92 mmol/L — ABNORMAL LOW (ref 101–111)
GFR calc Af Amer: 5 mL/min — ABNORMAL LOW (ref >60)
GFR, EST NON AFRICAN AMERICAN: 4 mL/min — AB (ref >60)
Glucose, Bld: 208 mg/dL — ABNORMAL HIGH (ref 65–99)
PHOSPHORUS: 4.4 mg/dL (ref 2.5–4.6)
Potassium: 6.8 mmol/L (ref 3.5–5.1)
SODIUM: 133 mmol/L — AB (ref 135–145)

## 2016-09-10 LAB — CBC
HCT: 31.3 % — ABNORMAL LOW (ref 35.0–47.0)
Hemoglobin: 10.9 g/dL — ABNORMAL LOW (ref 12.0–16.0)
MCH: 32.8 pg (ref 26.0–34.0)
MCHC: 34.8 g/dL (ref 32.0–36.0)
MCV: 94.1 fL (ref 80.0–100.0)
PLATELETS: 181 10*3/uL (ref 150–440)
RBC: 3.32 MIL/uL — ABNORMAL LOW (ref 3.80–5.20)
RDW: 14.2 % (ref 11.5–14.5)
WBC: 4.7 10*3/uL (ref 3.6–11.0)

## 2016-09-10 LAB — GLUCOSE, CAPILLARY
GLUCOSE-CAPILLARY: 101 mg/dL — AB (ref 65–99)
Glucose-Capillary: 221 mg/dL — ABNORMAL HIGH (ref 65–99)
Glucose-Capillary: 230 mg/dL — ABNORMAL HIGH (ref 65–99)
Glucose-Capillary: 299 mg/dL — ABNORMAL HIGH (ref 65–99)

## 2016-09-10 SURGERY — A/V SHUNTOGRAM/FISTULAGRAM
Anesthesia: Moderate Sedation

## 2016-09-10 MED ORDER — LIDOCAINE-EPINEPHRINE (PF) 1 %-1:200000 IJ SOLN
INTRAMUSCULAR | Status: AC
  Filled 2016-09-10: qty 30

## 2016-09-10 MED ORDER — LIDOCAINE-EPINEPHRINE (PF) 1 %-1:200000 IJ SOLN
INTRAMUSCULAR | Status: DC | PRN
  Administered 2016-09-10: 5 mL

## 2016-09-10 SURGICAL SUPPLY — 1 items: KIT DIALYSIS CATH TRI 30X13 (CATHETERS) ×3 IMPLANT

## 2016-09-10 NOTE — ED Provider Notes (Signed)
-----------------------------------------   6:53 AM on 09/10/2016 -----------------------------------------   Blood pressure (!) 161/83, pulse 80, temperature 98.3 F (36.8 C), temperature source Oral, resp. rate 16, height 5\' 2"  (1.575 m), weight 172 lb 13.5 oz (78.4 kg), SpO2 97 %.  The patient had no acute events since last update.  Calm and cooperative at this time.  Should be having dialysis today. Disposition is pending CSW team recommendations.      Paulette Blanch, MD 09/10/16 [phone]

## 2016-09-10 NOTE — ED Notes (Signed)
Dialysis called and stated pt signed off dialysis early, got 1.5 liters off pt. Pt coming back from dialysis now.

## 2016-09-10 NOTE — Progress Notes (Signed)
Pre Dialysis 

## 2016-09-10 NOTE — Progress Notes (Signed)
Post hd vitals 

## 2016-09-10 NOTE — ED Notes (Signed)
Dr. Joni Fears notified of critical K-dur

## 2016-09-10 NOTE — Progress Notes (Signed)
Subjective:  We were unable to perform HD this AM.  Clots were being pulled out of the arterial side of the access. Case has been discussed with vascular surgery.   Objective:  Vital signs in last 24 hours:  Temp:  [98.1 F (36.7 C)-98.3 F (36.8 C)] 98.1 F (36.7 C) (10/16 0955) Pulse Rate:  [80-90] 87 (10/16 0955) Resp:  [16-17] 16 (10/16 0955) BP: (134-161)/(75-83) 157/83 (10/16 0955) SpO2:  [97 %-100 %] 100 % (10/16 0955) Weight:  [79.4 kg (175 lb 0.7 oz)] 79.4 kg (175 lb 0.7 oz) (10/16 0955)  Weight change:  Filed Weights   09/07/16 1445 09/07/16 1820 09/10/16 0955  Weight: 82.7 kg (182 lb 5.1 oz) 78.4 kg (172 lb 13.5 oz) 79.4 kg (175 lb 0.7 oz)    Intake/Output:    Intake/Output Summary (Last 24 hours) at 09/10/16 1119 Last data filed at 09/09/16 2330  Gross per 24 hour  Intake              100 ml  Output                0 ml  Net              100 ml     Physical Exam: General: No acute distress  HEENT [place]/AT hearing intact  Neck Supple   Pulm/lungs Normal breathing effort, clear to auscultation   CVS/Heart Regular rhythm, no rub or gallop, Systolic murmur present  Abdomen:  Soft, nontender, nondistended   Extremities: No peripheral edema   Neurologic: Awake, alert, following commands  Skin: No acute rashes   Access: Left upper arm AV fistula, bruit is present       Basic Metabolic Panel:   Recent Labs Lab 09/07/16 1450  NA 134*  K 5.2*  CL 92*  CO2 27  GLUCOSE 257*  BUN 102*  CREATININE 9.92*  CALCIUM 9.7  PHOS 4.1     CBC:  Recent Labs Lab 09/07/16 1450  WBC 4.4  HGB 9.9*  HCT 28.8*  MCV 94.3  PLT 131*      Microbiology:  No results found for this or any previous visit (from the past 720 hour(s)).  Coagulation Studies: No results for input(s): LABPROT, INR in the last 72 hours.  Urinalysis: No results for input(s): COLORURINE, LABSPEC, PHURINE, GLUCOSEU, HGBUR, BILIRUBINUR, KETONESUR, PROTEINUR, UROBILINOGEN, NITRITE,  LEUKOCYTESUR in the last 72 hours.  Invalid input(s): APPERANCEUR    Imaging: No results found.   Medications:     . acetaminophen  650 mg Oral Once  . amLODipine  10 mg Oral QPM  . aspirin  81 mg Oral Daily  . cholecalciferol  800 Units Oral Daily  . cloNIDine  0.1 mg Oral BID  . divalproex  1,000 mg Oral QHS  . epoetin (EPOGEN/PROCRIT) injection  4,000 Units Intravenous Q M,W,F-HD  . escitalopram  15 mg Oral Daily  . feeding supplement (NEPRO CARB STEADY)  237 mL Oral BID BM  . hydrALAZINE  100 mg Oral TID  . insulin aspart  0-15 Units Subcutaneous TID WC  . insulin aspart  0-5 Units Subcutaneous QHS  . irbesartan  300 mg Oral QHS  . metoprolol tartrate  50 mg Oral BID  . patiromer  8.4 g Oral Q24H  . QUEtiapine  200 mg Oral BID  . traZODone  100 mg Oral QHS   acetaminophen, heparin  Assessment/ Plan:  35 y.o. African-American female with developmental delay, ESRD, AOCD, SHPTH, HTN, schizophrenia, mild cognitive  impairment, diabetes mellitus type II  [facility] MWF [address]  1. Hyperkalemia:  Last potassium was 5.2 however this was on Friday. We are attempting to repeat serum potassium at the moment. We will have the dialysis nurse come down an attempt to get this out of her access as she has poor venous access otherwise.  2. Hypertension:  - Amlodipine, clonidine, hydralazine, irbesartan, metoprolol - Blood pressure currently 157/83.  3. Anemia of chronic kidney disease  - Hemoglobin currently 9.9, continue Epogen with dialysis.  4. Secondary Hyperparathyroidism - currently not on binders. Phosphorous under good control.  5. End Stage Renal Disease: on hemodialysis MWF schedule.  - Possible discharge this week.  6. DM-2 with CKD Insulin dependent Pre-meal Novolog increased to 10 TID as per diabetes team recommendations   LOS: 0 Kelly [name] 10/16/201711:19 AM

## 2016-09-10 NOTE — Op Note (Signed)
  OPERATIVE NOTE   PROCEDURE: 1. Ultrasound guidance for vascular access right femoral vein 2. Placement of a 30 cm Trialysis dialysis catheter right femoral vein  PRE-OPERATIVE DIAGNOSIS: 1. End stage renal failure with non-functional access 2. Hyperkalemia precluding intervention for her graft  POST-OPERATIVE DIAGNOSIS: Same  SURGEON: Leotis Pain, MD  ASSISTANT(S): None  ANESTHESIA: local  ESTIMATED BLOOD LOSS: Minimal   FINDING(S): 1. None  SPECIMEN(S): None  INDICATIONS:  Patient is a 35 y.o.female who presents with  Hyperkalemia and renal failure with an access that is not usable currently.  Risks and benefits were discussed, and informed consent was obtained..  DESCRIPTION: After obtaining full informed written consent, the patient was laid flat in the bed. The right groin was sterilely prepped and draped in a sterile surgical field was created. The right femoral vein was visualized with ultrasound and found to be widely patent. It was then accessed under direct guidance without difficulty with a Seldinger needle and a permanent image was recorded. A J-wire was then placed. After skin nick and dilatation, a 30 cm Trialysis type dialysis catheter was placed over the wire and the wire was removed. The lumens withdrew dark red nonpulsatile blood and flushed easily with sterile saline. The catheter was secured to the skin with 3 nylon sutures. Sterile dressing was placed.  COMPLICATIONS: None  CONDITION: Stable  Leotis Pain 09/10/2016 2:17 PM  This note was created with Dragon Medical transcription system. Any errors in dictation are purely unintentional.

## 2016-09-10 NOTE — ED Notes (Addendum)
Pt stated she does not like cooked carrots and does not want to eat her dinner.

## 2016-09-10 NOTE — ED Notes (Signed)
Pt resting in room, pt awake and alert in no acute distress

## 2016-09-10 NOTE — Progress Notes (Signed)
Post hd assessment 

## 2016-09-10 NOTE — Progress Notes (Signed)
Pt arrived for HD tx at 0955am. Cannulated pt with no difficulties. Upon starting HD treatment pt arterial line completely clotted. AP too high for tx. Re cannulated arterial site and pulled inch long clot. Notified MD. He will contact vascular. Report to O. Broomer, Therapist, sports. Requested that 24 hours after HD tx floor RN removed bandage over graft.

## 2016-09-10 NOTE — ED Notes (Signed)
Dialysis called stating pt has not eaten recently and requesting to have some food brought to pt. Beverlee Nims, EDT taking pt lunch down- fried chicken, side salad, fruit cup, and carrots.

## 2016-09-10 NOTE — Progress Notes (Signed)
Pt ended HD tx 1.5 hours early. No reason given. She wanted to go back to her room. Alert, no c/o, vss, report to RN, ccmd notified, MD notified.

## 2016-09-10 NOTE — ED Notes (Signed)
Pt asked to have 2 bowls of raisin bran cereal with milk. Provided to pt along with warm blanket.

## 2016-09-10 NOTE — Progress Notes (Signed)
End of hd tx  

## 2016-09-10 NOTE — ED Notes (Signed)
Pt walking around room, pt awake and alert

## 2016-09-10 NOTE — ED Notes (Signed)
Pt resting in bed, resp even and unlabored, eyes closed 

## 2016-09-10 NOTE — ED Notes (Signed)
Pt made aware of NPO status, food removed from pts room

## 2016-09-10 NOTE — Progress Notes (Signed)
Pre hd assessment  

## 2016-09-10 NOTE — ED Notes (Signed)
Pt resting in bed, eyes closed, resp even and unlabored

## 2016-09-10 NOTE — ED Notes (Signed)
Report to [place], rn.

## 2016-09-10 NOTE — Progress Notes (Signed)
Pre hd info 

## 2016-09-10 NOTE — ED Notes (Signed)
Pt transferred to to specials via wheelchair with ed tech

## 2016-09-10 NOTE — ED Notes (Signed)
Pt stated she would not eat her supper tray. Dietary called to get pt egg salad and tossed salad with thousand island dressing. Pt verbalized agreement to that and seems happy about choice.

## 2016-09-10 NOTE — ED Notes (Signed)
Pt returned via wheelchair from dialysis with Beverlee Nims, EDT

## 2016-09-10 NOTE — ED Notes (Signed)
Pt returned from dialysis

## 2016-09-10 NOTE — Progress Notes (Signed)
Dialysis started 

## 2016-09-10 NOTE — ED Notes (Signed)
Pt ambulatory out to desk in no apparent distress.

## 2016-09-10 NOTE — ED Notes (Signed)
Pt taken to dialysis in wheelchair by [employer]

## 2016-09-10 NOTE — Progress Notes (Signed)
Patient known to our service who has been in the emergency department now for about 3 months due to behavioral medicine issues. Today, her dialysis access was pulling clots and they were unable to do dialysis. Although she is afebrile with vital signs stable, her potassium came back at 6.8 which is prohibitively high for any sort a fistulogram or shuntogram procedure. She will need dialysis today so a temporary dialysis catheter will be placed, and some point later this week we can perform a shuntogram to try to get this functional long-term.

## 2016-09-10 NOTE — ED Notes (Signed)
approx 14 minutes spent trying to get pt to take am medication without success. Pt refused veltassa, pt also stated "i don't know why you bring me that, i'm not taking it."

## 2016-09-11 ENCOUNTER — Encounter: Payer: Self-pay | Admitting: Vascular Surgery

## 2016-09-11 DIAGNOSIS — F70 Mild intellectual disabilities: Secondary | ICD-10-CM

## 2016-09-11 LAB — GLUCOSE, CAPILLARY
GLUCOSE-CAPILLARY: 102 mg/dL — AB (ref 65–99)
Glucose-Capillary: 172 mg/dL — ABNORMAL HIGH (ref 65–99)
Glucose-Capillary: 224 mg/dL — ABNORMAL HIGH (ref 65–99)
Glucose-Capillary: 150 mg/dL — ABNORMAL HIGH (ref 65–99)

## 2016-09-11 LAB — BASIC METABOLIC PANEL
Anion gap: 14 (ref 5–15)
BUN: 76 mg/dL — AB (ref 6–20)
CALCIUM: 9.7 mg/dL (ref 8.9–10.3)
CO2: 28 mmol/L (ref 22–32)
CREATININE: 8.96 mg/dL — AB (ref 0.44–1.00)
Chloride: 93 mmol/L — ABNORMAL LOW (ref 101–111)
GFR calc Af Amer: 6 mL/min — ABNORMAL LOW (ref >60)
GFR, EST NON AFRICAN AMERICAN: 5 mL/min — AB (ref >60)
GLUCOSE: 276 mg/dL — AB (ref 65–99)
Potassium: 5.7 mmol/L — ABNORMAL HIGH (ref 3.5–5.1)
SODIUM: 135 mmol/L (ref 135–145)

## 2016-09-11 LAB — PHOSPHORUS: Phosphorus: 5.5 mg/dL — ABNORMAL HIGH (ref 2.5–4.6)

## 2016-09-11 MED ORDER — ONDANSETRON 4 MG PO TBDP
ORAL_TABLET | ORAL | Status: AC
  Administered 2016-09-11: 4 mg via ORAL
  Filled 2016-09-11: qty 1

## 2016-09-11 MED ORDER — HEPARIN (PORCINE) IN NACL 2-0.9 UNIT/ML-% IJ SOLN
INTRAMUSCULAR | Status: AC
  Filled 2016-09-11: qty 1000

## 2016-09-11 MED ORDER — ONDANSETRON 4 MG PO TBDP
4.0000 mg | ORAL_TABLET | Freq: Once | ORAL | Status: AC
  Administered 2016-09-11: 4 mg via ORAL

## 2016-09-11 MED ORDER — PROMETHAZINE HCL 25 MG/ML IJ SOLN
12.5000 mg | Freq: Once | INTRAMUSCULAR | Status: AC
  Administered 2016-09-11: 12.5 mg via INTRAMUSCULAR
  Filled 2016-09-11: qty 1

## 2016-09-11 MED ORDER — LIDOCAINE HCL (PF) 1 % IJ SOLN
INTRAMUSCULAR | Status: AC
  Filled 2016-09-11: qty 30

## 2016-09-11 NOTE — ED Notes (Signed)
She has returned from dialysis

## 2016-09-11 NOTE — Progress Notes (Signed)
Post dialysis 

## 2016-09-11 NOTE — ED Notes (Signed)
The patient refused to take a shower at this time.

## 2016-09-11 NOTE — Progress Notes (Signed)
Dialysis complete

## 2016-09-11 NOTE — ED Notes (Signed)
BEHAVIORAL HEALTH ROUNDING Patient sleeping: No. Patient alert and oriented: yes Behavior appropriate: Yes.  ; If no, describe:  Nutrition and fluids offered: yes Toileting and hygiene offered: Yes  Sitter present: q15 minute observations and security  monitoring Law enforcement present: Yes  ODS  She is currently taking a shower

## 2016-09-11 NOTE — ED Notes (Addendum)
Pt refusing night time meds because of nausea.  EDP notified.  Pt reports wanting something to drink.  Pt offered ginger ale and sprite and refused both.  Pt also offered ice chips and refused.

## 2016-09-11 NOTE — NC FL2 (Signed)
[place] LEVEL OF CARE SCREENING TOOL     IDENTIFICATION  Patient Name: Kelly [name] Birthdate: 12/28/1980 Sex: female Admission Date (Current Location): 06/11/2016  [place] and [place] Number:  Selena Lesser [id number] [place] and Address:  [facility], [address], [place], [place] [address]      Provider Number: [id number]  Attending Physician Name and Address:  No att. providers found  Relative Name and Phone Number:  [name] (Legal guardian) [phone]    Current Level of Care: Hospital Recommended Level of Care: [facility] Prior Approval Number: [id number]  Date Approved/Denied: 09/11/16 PASRR Number: [id number] K  Discharge Plan: Domiciliary (Rest home)    Current Diagnoses: Patient Active Problem List   Diagnosis Date Noted  . 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 Hyperkalemia 06/25/2016  . Adjustment disorder with mixed disturbance of emotions and conduct   . Diabetes ([place]) 04/23/2015  . Essential hypertension 04/23/2015  . ESRD on dialysis ([place]) 04/23/2015  . Pain of left arm 04/22/2015    Orientation RESPIRATION BLADDER Height & Weight     Self, Time, Situation, Place  Normal Continent Weight: 177 lb 14.6 oz (80.7 kg) Height:  5\' 2"  (157.5 cm)  BEHAVIORAL SYMPTOMS/MOOD NEUROLOGICAL BOWEL NUTRITION STATUS  Verbally abusive, Other (Comment) (Has made threats to harm self and other, acts out when agitated (yelling/cursing). Has the ability to redirect her anger when properly supported.)  (None) Continent Diet (Diet renal/carb modified with fluid restriction )  AMBULATORY STATUS COMMUNICATION OF NEEDS Skin   Independent Verbally Normal                       Personal Care Assistance Level of Assistance  Bathing, Feeding, Dressing Bathing Assistance: Limited assistance Feeding assistance: Independent Dressing Assistance: Independent     Functional Limitations Info  Sight, Hearing,  Speech Sight Info: Adequate Hearing Info: Adequate Speech Info: Adequate    SPECIAL CARE FACTORS FREQUENCY   (Hemodialysis patient)    3x/week requires supervision at [facility]                  Contractures  Not present    Additional Factors Info  Code Status, Allergies, Insulin Sliding Scale Code Status Info: Full Code Allergies Info: No known allergies    Insulin Sliding Scale Info: insulin aspart (novoLOG) injection 0-15 Units, insulin aspart (novoLOG) injection 0-5 Units       Current Medications (09/11/2016):  This is the current hospital active medication list Current Facility-Administered Medications  Medication Dose Route Frequency Provider Last Rate Last Dose  . acetaminophen (TYLENOL) tablet 650 mg  650 mg Oral Once Nance Pear, MD   Stopped at 06/30/16 1021  . acetaminophen (TYLENOL) tablet 650 mg  650 mg Oral Q6H PRN Lisa Roca, MD   650 mg at 08/24/16 1006  . amLODipine (NORVASC) tablet 10 mg  10 mg Oral QPM Lisa Roca, MD   10 mg at 09/10/16 1842  . aspirin chewable tablet 81 mg  81 mg Oral Daily Lisa Roca, MD   81 mg at 09/09/16 1400  . cholecalciferol (VITAMIN D) tablet 800 Units  800 Units Oral Daily Lisa Roca, MD   800 Units at 09/09/16 1404  . cloNIDine (CATAPRES) tablet 0.1 mg  0.1 mg Oral BID Lisa Roca, MD   0.1 mg at 09/10/16 2144  . divalproex (DEPAKOTE) DR tablet 1,000 mg  1,000 mg Oral QHS Lisa Roca, MD   1,000 mg at 09/10/16 2144  .  epoetin alfa (EPOGEN,PROCRIT) injection 4,000 Units  4,000 Units Intravenous Q M,W,F-HD Lisa Roca, MD   4,000 Units at 09/10/16 1507  . escitalopram (LEXAPRO) tablet 15 mg  15 mg Oral Daily Lisa Roca, MD   15 mg at 09/09/16 1359  . feeding supplement (NEPRO CARB STEADY) liquid 237 mL  237 mL Oral BID BM Harvest Dark, MD 0 mL/hr at 09/08/16 1030 237 mL at 09/10/16 0940  . heparin injection 1,500 Units  20 Units/kg Dialysis PRN Lisa Roca, MD      . hydrALAZINE (APRESOLINE) tablet  100 mg  100 mg Oral TID Lisa Roca, MD   100 mg at 09/10/16 2145  . insulin aspart (novoLOG) injection 0-15 Units  0-15 Units Subcutaneous TID WC Lisa Roca, MD   5 Units at 09/10/16 1840  . insulin aspart (novoLOG) injection 0-5 Units  0-5 Units Subcutaneous QHS Lisa Roca, MD   3 Units at 09/10/16 2151  . irbesartan (AVAPRO) tablet 300 mg  300 mg Oral QHS Lisa Roca, MD   300 mg at 09/10/16 2144  . metoprolol (LOPRESSOR) tablet 50 mg  50 mg Oral BID Lisa Roca, MD   50 mg at 09/10/16 2145  . patiromer Daryll Drown) packet 8.4 g  8.4 g Oral Q24H Munsoor Lateef, MD   8.4 g at 09/09/16 0831  . QUEtiapine (SEROQUEL) tablet 200 mg  200 mg Oral BID Lisa Roca, MD   200 mg at 09/10/16 2144  . traZODone (DESYREL) tablet 100 mg  100 mg Oral QHS Lisa Roca, MD   100 mg at 09/10/16 2145   Current Outpatient Prescriptions  Medication Sig Dispense Refill  . amLODipine (NORVASC) 10 MG tablet Take 10 mg by mouth at bedtime. *hold for systolic blood pressure <580, call MD if holding this medication.*    . aspirin 81 MG chewable tablet Chew 81 mg by mouth daily.    . B Complex-C-Folic Acid (RENA-VITE RX PO) Take 1 tablet by mouth daily.    . calcium acetate (PHOSLO) 667 MG capsule Take 1,334 mg by mouth 2 (two) times daily with a meal.     . cinacalcet (SENSIPAR) 60 MG tablet Take 60 mg by mouth daily.    . cloNIDine (CATAPRES) 0.2 MG tablet Take 0.2 mg by mouth 2 (two) times daily.    . divalproex (DEPAKOTE) 500 MG DR tablet Take 1,000 mg by mouth at bedtime.     . docusate sodium (COLACE) 100 MG capsule Take 100 mg by mouth at bedtime.     Marland Kitchen escitalopram (LEXAPRO) 10 MG tablet Take 15 mg by mouth daily.    . folic acid (FOLVITE) 1 MG tablet Take 2 mg by mouth at bedtime.     . insulin aspart (NOVOLOG) 100 UNIT/ML injection Inject 0-12 Units into the skin 3 (three) times daily before meals. Per sliding scale    . lidocaine-prilocaine (EMLA) cream Apply 1 application topically 3 (three) times a  week. Apply to access site 30 minutes before dialysis.    Marland Kitchen medroxyPROGESTERone (DEPO-PROVERA) 150 MG/ML injection Inject 150 mg into the muscle every 3 (three) months.    . metoprolol (LOPRESSOR) 50 MG tablet Take 50 mg by mouth 2 (two) times daily.    . Multiple Vitamin (THEREMS PO) Take 1 tablet by mouth daily.    . QUEtiapine (SEROQUEL) 200 MG tablet Take 200 mg by mouth 2 (two) times daily.     . traZODone (DESYREL) 100 MG tablet Take 100 mg by mouth at bedtime.    Marland Kitchen  vitamin B-12 (CYANOCOBALAMIN) 1000 MCG tablet Take 1,000 mcg by mouth at bedtime.        Discharge Medications: Please see discharge summary for a list of discharge medications.  Relevant Imaging Results:  Relevant Lab Results:   Additional Information SSN [id number]  [name], LCSWA

## 2016-09-11 NOTE — ED Notes (Signed)
BEHAVIORAL HEALTH ROUNDING Patient sleeping: No. Patient alert and oriented: yes Behavior appropriate: Yes.  ; If no, describe:  Nutrition and fluids offered: yes Toileting and hygiene offered: Yes  Sitter present: q15 minute observations and security  monitoring Law enforcement present: Yes  ODS  

## 2016-09-11 NOTE — ED Notes (Signed)

## 2016-09-11 NOTE — Progress Notes (Signed)
CSW took pt outside for some fresh air. Pt states that she is not feeling well today and has been throwing up and not eating much. CSW expressed her concern. Pt also mentioned that she received a visit from Mr. And Mrs. Watlington over the weekend. She states that the visit went well and referred to them as her "new group home mommy and daddy". Pt continues to show excitement about her upcoming discharge.   Ms. Oswaldo Milian has completed and submitted the single case agreement to [facility]. However, the care plan and PCP needed to be updated. CSW completed requested paperwork and Mrs. Oswaldo Milian will come to pick up paperwork from [place].   CSW will continue to follow pt and assist as needed.  [name], MSW, [place]

## 2016-09-11 NOTE — ED Notes (Addendum)
BEHAVIORAL HEALTH ROUNDING Patient sleeping: No. Patient alert and oriented: yes Behavior appropriate: Yes.  ; If no, describe:  Nutrition and fluids offered: yes Toileting and hygiene offered: Yes  Sitter present: q15 minute observations and security monitoring Law enforcement present: Yes  ODS  ENVIRONMENTAL ASSESSMENT Potentially harmful objects out of patient reach: Yes.   Personal belongings secured: Yes.   Patient dressed in hospital provided attire only: Yes.   Plastic bags out of patient reach: Yes.   Patient care equipment (cords, cables, call bells, lines, and drains) shortened, removed, or accounted for: Yes.   Equipment and supplies removed from bottom of stretcher: Yes.   Potentially toxic materials out of patient reach: Yes.   Sharps container removed or out of patient reach: Yes.    Awakened her for blood draw and CBG  NAD assessed  She denies pain at this time

## 2016-09-11 NOTE — Progress Notes (Signed)
Pre Dialysis 

## 2016-09-11 NOTE — ED Notes (Signed)
Pt to HD at this time

## 2016-09-11 NOTE — ED Provider Notes (Signed)
No evidence overnight, patient remains medically stable for psychiatric disposition. Vitals:   09/10/16 2300 09/11/16 0600  BP: 118/88 137/78  Pulse: 93 87  Resp: 18 20  Temp: 98.5 F (36.9 C) 98.3 F (36.8 C)      Earleen Newport, MD 09/11/16 [phone]

## 2016-09-11 NOTE — Progress Notes (Signed)
Dialysis started 

## 2016-09-11 NOTE — ED Notes (Signed)
Attempted to give patient BP meds but patient was snoring and would not get up regardless how much I attempted to get her up.  I will have day shift medicate her once she is up and about.  The Daryll Drown is not in the Pyxis and Pharmacy said they would send up a dose for today.

## 2016-09-11 NOTE — Progress Notes (Signed)
CSW called Asencion Noble ([place] director) [phone]. Ms. Watlington states that all paperwork has been submitted and she is awaiting approval from [facility].   CSW called Scarlette Shorts ([place]) [phone]. No answer, left a message. CSW awaiting call back.  [name], MSW, [place]

## 2016-09-11 NOTE — Progress Notes (Signed)
Subjective:  Patient serum potassium was 5.7 this a.m. Therefore she is undergoing dialysis again today. She is due for declot of her access today as well.   Objective:  Vital signs in last 24 hours:  Temp:  [98 F (36.7 C)-99 F (37.2 C)] 99 F (37.2 C) (10/17 0955) Pulse Rate:  [87-115] 99 (10/17 1100) Resp:  [12-20] 16 (10/17 1100) BP: (104-152)/(67-108) 133/71 (10/17 1100) SpO2:  [94 %-100 %] 100 % (10/17 1100) Weight:  [79.1 kg (174 lb 6.1 oz)-81.3 kg (179 lb 3.7 oz)] 80.7 kg (177 lb 14.6 oz) (10/17 0955)  Weight change:  Filed Weights   09/10/16 1430 09/10/16 1630 09/11/16 0955  Weight: 81.3 kg (179 lb 3.7 oz) 79.1 kg (174 lb 6.1 oz) 80.7 kg (177 lb 14.6 oz)    Intake/Output:    Intake/Output Summary (Last 24 hours) at 09/11/16 1138 Last data filed at 09/10/16 1630  Gross per 24 hour  Intake                0 ml  Output             1550 ml  Net            -1550 ml     Physical Exam: General: No acute distress  HEENT /AT hearing intact  Neck Supple   Pulm/lungs Normal breathing effort, clear to auscultation   CVS/Heart Regular rhythm, no rub or gallop, Systolic murmur present  Abdomen:  Soft, nontender, nondistended   Extremities: No peripheral edema   Neurologic: Awake, alert, following commands  Skin: No acute rashes   Access: Left upper arm AV fistula, bruit is present       Basic Metabolic Panel:   Recent Labs Lab 09/07/16 1450 09/10/16 1137 09/11/16 1010  NA 134* 133* 135  K 5.2* 6.8* 5.7*  CL 92* 92* 93*  CO2 27 26 28   GLUCOSE 257* 208* 276*  BUN 102* 99* 76*  CREATININE 9.92* 10.90* 8.96*  CALCIUM 9.7 9.9 9.7  PHOS 4.1 4.4 5.5*     CBC:  Recent Labs Lab 09/07/16 1450 09/10/16 1137  WBC 4.4 4.7  HGB 9.9* 10.9*  HCT 28.8* 31.3*  MCV 94.3 94.1  PLT 131* 181      Microbiology:  No results found for this or any previous visit (from the past 720 hour(s)).  Coagulation Studies: No results for input(s): LABPROT, INR in  the last 72 hours.  Urinalysis: No results for input(s): COLORURINE, LABSPEC, PHURINE, GLUCOSEU, HGBUR, BILIRUBINUR, KETONESUR, PROTEINUR, UROBILINOGEN, NITRITE, LEUKOCYTESUR in the last 72 hours.  Invalid input(s): APPERANCEUR    Imaging: No results found.   Medications:     . acetaminophen  650 mg Oral Once  . amLODipine  10 mg Oral QPM  . aspirin  81 mg Oral Daily  . cholecalciferol  800 Units Oral Daily  . cloNIDine  0.1 mg Oral BID  . divalproex  1,000 mg Oral QHS  . epoetin (EPOGEN/PROCRIT) injection  4,000 Units Intravenous Q M,W,F-HD  . escitalopram  15 mg Oral Daily  . feeding supplement (NEPRO CARB STEADY)  237 mL Oral BID BM  . hydrALAZINE  100 mg Oral TID  . insulin aspart  0-15 Units Subcutaneous TID WC  . insulin aspart  0-5 Units Subcutaneous QHS  . irbesartan  300 mg Oral QHS  . metoprolol tartrate  50 mg Oral BID  . patiromer  8.4 g Oral Q24H  . QUEtiapine  200 mg Oral BID  .  traZODone  100 mg Oral QHS   acetaminophen, heparin  Assessment/ Plan:  35 y.o. African-American female with developmental delay, ESRD, AOCD, SHPTH, HTN, schizophrenia, mild cognitive impairment, diabetes mellitus type II  [facility] MWF [address]  1. Hyperkalemia:  Patient signed off of dialysis early yesterday. Therefore her potassium is only down to 5.7 today. We have set the patient up for another dialysis treatment today.  2. Hypertension:  - Amlodipine, clonidine, hydralazine, irbesartan, metoprolol - Blood pressure this a.m. is 133/71.  3. Anemia of chronic kidney disease  - Hemoglobin currently 9.9, continue Epogen with dialysis.  4. Secondary Hyperparathyroidism - Phosphorous 5.5 today. Should come down with dialysis.  5. End Stage Renal Disease: on hemodialysis MWF schedule.  - Extra dialysis today as patient signed off early yesterday.  6. DM-2 with CKD Insulin dependent    LOS: 0 Lagena Strand 10/17/201711:38 AM

## 2016-09-11 NOTE — ED Notes (Signed)
ED BHU [place] Is the patient under IVC or is there intent for IVC:   No    Is the patient medically cleared: Yes.   Is there vacancy in the ED BHU: Yes.   Is the population mix appropriate for patient: Yes.   Is the patient awaiting placement in inpatient or outpatient setting: Yes  Group home placement with HD access   Has the patient had a psychiatric consult: Yes.   Survey of unit performed for contraband, proper placement and condition of furniture, tampering with fixtures in bathroom, shower, and each patient room: Yes.  ; Findings:  APPEARANCE/BEHAVIOR Calm and cooperative NEURO ASSESSMENT Orientation: oriented x4  Denies pain Hallucinations: No.None noted (Hallucinations) Speech: Normal Gait: normal RESPIRATORY ASSESSMENT Even  Unlabored respirations  CARDIOVASCULAR ASSESSMENT Pulses equal   regular rate  Skin warm and dry   GASTROINTESTINAL ASSESSMENT no GI complaint EXTREMITIES Full ROM  PLAN OF CARE Provide calm/safe environment. Vital signs assessed twice daily. ED BHU Assessment once each 12-hour shift. Collaborate with TTS daily or as condition indicates. Assure the ED provider has rounded once each shift. Provide and encourage hygiene. Provide redirection as needed. Assess for escalating behavior; address immediately and inform ED provider.  Assess family dynamic and appropriateness for visitation as needed: Yes.  ; If necessary, describe findings:  Educate the patient/family about BHU procedures/visitation: Yes.  ; If necessary, describe findings:

## 2016-09-11 NOTE — ED Notes (Signed)
Pt in dialysis

## 2016-09-11 NOTE — ED Notes (Signed)

## 2016-09-11 NOTE — ED Notes (Signed)
Vol/Pending placement  

## 2016-09-11 NOTE — Consult Note (Signed)
[facility] Face-to-Face Psychiatry Consult   Reason for Consult:  Consult for this 35 year old woman with a history of brain injury with behavioral disturbance sent here under commitment petitioned from [place]. Referring Physician:  Darl Householder Patient Identification: Kelly [name] MRN:  [id number] Principal Diagnosis: Mild intellectual disability Diagnosis:   Patient Active Problem List   Diagnosis Date Noted  . 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 Hyperkalemia [E87.5] 06/25/2016  . Adjustment disorder with mixed disturbance of emotions and conduct [F43.25]   . Diabetes ([place]) [E11.9] 04/23/2015  . Essential hypertension [I10] 04/23/2015  . ESRD on dialysis ([place]) [N18.6, Z99.2] 04/23/2015  . Pain of left arm [M79.602] 04/22/2015    Total Time spent with patient: 15 minutes  Subjective:   Kelly [name] is a 35 y.o. female patient admitted with "I'm all right, how are you?".    HPI:  Follow-up note for this 35 year old woman with mild intellectual disability who has been in the emergency room since early July awaiting some kind of placement. Patient has been psychiatrically stable for months. She has no new complaints. Her mood is generally upbeat usually a little playful. No sign of any aggression dangerousness violence or suicidality. No behavior or statements to suggest psychotic thinking. She has been completely compliant with treatment for months. We have been awaiting arrangements for placement and I am told that we may finally have a placement by some time this week. Patient is aware and is completely agreeable.  Social history: Patient has a legal guardian. She is legally incompetent. She lives in a group home or family care home. Has been there for quite a while. She's been to several others over the years and always has some problems with her oppositional behavior.  Medical history: Patient has diabetes and hypertension and has end-stage renal disease and is on dialysis. She's  been on dialysis for years now. Seems to be relatively stable with it.  Substance abuse history: There is a distant history of substance abuse and in fact I have always been given to understand that it was a substance abuse problem that originally resulted in the situation where she had the anoxic brain injury. In the time since she is been disabled she does not abuse drugs or alcohol.  Past Psychiatric History: Long history of encounters with psychiatric staff because of oppositional behavior. Usually her behavior problems will be rather childish. She will refuse dialysis at times because of some minor disagreement with her staff. Inevitably she will be able to be coaxed back into appropriate behavior. She is on medication to help with keeping her calm and keeping her behavior under control.  Risk to Self: Suicidal Ideation: Yes-Currently Present Suicidal Intent: Yes-Currently Present Is patient at risk for suicide?: No Suicidal Plan?: Yes-Currently Present Specify Current Suicidal Plan: Cut wrist, overdose on pills Access to Means: No (Currently in the hospital) What has been your use of drugs/alcohol within the last 12 months?: denied use How many times?: 1 Other Self Harm Risks: denied Triggers for Past Attempts: Unknown Intentional Self Injurious Behavior: None (denied) Risk to Others: Homicidal Ideation: Yes-Currently Present Thoughts of Harm to Others: Yes-Currently Present Comment - Thoughts of Harm to Others: States that she wants to kill others Current Homicidal Intent: Yes-Currently Present Current Homicidal Plan: No Access to Homicidal Means: No Identified Victim: Refuses to identify History of harm to others?: No Assessment of Violence: None Noted Violent Behavior Description: denied by patient Does patient have access to weapons?: No Criminal Charges Pending?:  No Does patient have a court date: No Prior Inpatient Therapy: Prior Inpatient Therapy: No (denied by  patient) Prior Therapy Dates: n/a Prior Therapy Facilty/Provider(s): n/a Reason for Treatment: n/a Prior Outpatient Therapy: Prior Outpatient Therapy: Yes Prior Therapy Dates: [facility] Prior Therapy Facilty/Provider(s): Current Reason for Treatment: schizophrenia Does patient have an ACCT team?: No Does patient have Intensive In-House Services?  : No Does patient have [facility] services? : No Does patient have P4CC services?: No  Past Medical History:  Past Medical History:  Diagnosis Date  . Alcohol abuse    chronic  . Bipolar disorder ([place])   . Chronic kidney disease   . Cognitive changes   . Cognitive impairment   . Depression   . Depression   . Diabetes mellitus without complication ([place])   . Elevated lipids   . Hyperparathyroidism ([place])   . Hypertension   . Staph aureus infection    A/V fistula  . UTI (lower urinary tract infection)     Past Surgical History:  Procedure Laterality Date  . AV FISTULA PLACEMENT    . CHOLECYSTECTOMY    . dialysis perma cath Right    chest  . PERIPHERAL VASCULAR CATHETERIZATION N/A 05/24/2015   Procedure: Dialysis/Perma Catheter Removal;  Surgeon: Katha Cabal, MD;  Location: [place] [facility];  Service: Cardiovascular;  Laterality: N/A;  . PERIPHERAL VASCULAR CATHETERIZATION Left 02/28/2016   Procedure: A/V Shuntogram/Fistulagram;  Surgeon: Katha Cabal, MD;  Location: [place] [facility];  Service: Cardiovascular;  Laterality: Left;  . PERIPHERAL VASCULAR CATHETERIZATION N/A 02/28/2016   Procedure: A/V Shunt Intervention;  Surgeon: Katha Cabal, MD;  Location: [place] [facility];  Service: Cardiovascular;  Laterality: N/A;  . PERIPHERAL VASCULAR CATHETERIZATION N/A 06/26/2016   Procedure: A/V Shuntogram/Fistulagram;  Surgeon: Katha Cabal, MD;  Location: [place] [facility];  Service: Cardiovascular;  Laterality: N/A;  . PERIPHERAL VASCULAR CATHETERIZATION N/A 09/10/2016   Procedure: A/V Shuntogram/Fistulagram;  Surgeon:  Algernon Huxley, MD;  Location: [place] [facility];  Service: Cardiovascular;  Laterality: N/A;  . VASCULAR ACCESS DEVICE INSERTION Left 04/22/2015   Procedure: INSERTION OF HERO VASCULAR ACCESS DEVICE;  Surgeon: Katha Cabal, MD;  Location: [facility];  Service: Vascular;  Laterality: Left;   Family History: History reviewed. No pertinent family history. Family Psychiatric  History: No known family psychiatric history except for substance abuse. Social History:  History  Alcohol Use No     History  Drug Use No    Social History   Social History  . Marital status: Single    Spouse name: N/A  . Number of children: N/A  . Years of education: N/A   Social History Main Topics  . Smoking status: Current Every Day Smoker    Packs/day: 0.25    Years: 0.00    Types: Cigarettes  . Smokeless tobacco: Current User  . Alcohol use No  . Drug use: No  . Sexual activity: Not Asked   Other Topics Concern  . None   Social History Narrative  . None   Additional Social History:    Allergies:  No Known Allergies  Labs:  Results for orders placed or performed during the hospital encounter of 06/11/16 (from the past 48 hour(s))  Glucose, capillary     Status: Abnormal   Collection Time: 09/09/16  5:49 PM  Result Value Ref Range   Glucose-Capillary 272 (H) 65 - 99 mg/dL  Glucose, capillary     Status: Abnormal  Collection Time: 09/09/16 10:26 PM  Result Value Ref Range   Glucose-Capillary 145 (H) 65 - 99 mg/dL  Glucose, capillary     Status: Abnormal   Collection Time: 09/10/16  6:18 AM  Result Value Ref Range   Glucose-Capillary 101 (H) 65 - 99 mg/dL  Renal function panel     Status: Abnormal   Collection Time: 09/10/16 11:37 AM  Result Value Ref Range   Sodium 133 (L) 135 - 145 mmol/L   Potassium 6.8 (HH) 3.5 - 5.1 mmol/L    Comment: CRITICAL RESULT CALLED TO, READ BACK BY AND VERIFIED WITH OLIVIA BROOMER AT 1228 09/10/16 DAS    Chloride 92 (L) 101 - 111 mmol/L   CO2 26  22 - 32 mmol/L   Glucose, Bld 208 (H) 65 - 99 mg/dL   BUN 99 (H) 6 - 20 mg/dL   Creatinine, Ser 10.90 (H) 0.44 - 1.00 mg/dL   Calcium 9.9 8.9 - 10.3 mg/dL   Phosphorus 4.4 2.5 - 4.6 mg/dL   Albumin 3.9 3.5 - 5.0 g/dL   GFR calc non Af Amer 4 (L) >60 mL/min   GFR calc Af Amer 5 (L) >60 mL/min    Comment: (NOTE) The eGFR has been calculated using the CKD EPI equation. This calculation has not been validated in all clinical situations. eGFR's persistently <60 mL/min signify possible Chronic Kidney Disease.    Anion gap 15 5 - 15  CBC     Status: Abnormal   Collection Time: 09/10/16 11:37 AM  Result Value Ref Range   WBC 4.7 3.6 - 11.0 K/uL   RBC 3.32 (L) 3.80 - 5.20 MIL/uL   Hemoglobin 10.9 (L) 12.0 - 16.0 g/dL   HCT 31.3 (L) 35.0 - 47.0 %   MCV 94.1 80.0 - 100.0 fL   MCH 32.8 26.0 - 34.0 pg   MCHC 34.8 32.0 - 36.0 g/dL   RDW 14.2 11.5 - 14.5 %   Platelets 181 150 - 440 K/uL  Glucose, capillary     Status: Abnormal   Collection Time: 09/10/16 11:58 AM  Result Value Ref Range   Glucose-Capillary 221 (H) 65 - 99 mg/dL  Glucose, capillary     Status: Abnormal   Collection Time: 09/10/16  6:31 PM  Result Value Ref Range   Glucose-Capillary 230 (H) 65 - 99 mg/dL  Glucose, capillary     Status: Abnormal   Collection Time: 09/10/16  9:46 PM  Result Value Ref Range   Glucose-Capillary 299 (H) 65 - 99 mg/dL   Comment 1 Notify RN   Glucose, capillary     Status: Abnormal   Collection Time: 09/11/16  8:20 AM  Result Value Ref Range   Glucose-Capillary 102 (H) 65 - 99 mg/dL  Basic metabolic panel     Status: Abnormal   Collection Time: 09/11/16 10:10 AM  Result Value Ref Range   Sodium 135 135 - 145 mmol/L   Potassium 5.7 (H) 3.5 - 5.1 mmol/L   Chloride 93 (L) 101 - 111 mmol/L   CO2 28 22 - 32 mmol/L   Glucose, Bld 276 (H) 65 - 99 mg/dL   BUN 76 (H) 6 - 20 mg/dL   Creatinine, Ser 8.96 (H) 0.44 - 1.00 mg/dL   Calcium 9.7 8.9 - 10.3 mg/dL   GFR calc non Af Amer 5 (L) >60 mL/min    GFR calc Af Amer 6 (L) >60 mL/min    Comment: (NOTE) The eGFR has been calculated using the CKD EPI  equation. This calculation has not been validated in all clinical situations. eGFR's persistently <60 mL/min signify possible Chronic Kidney Disease.    Anion gap 14 5 - 15  Phosphorus     Status: Abnormal   Collection Time: 09/11/16 10:10 AM  Result Value Ref Range   Phosphorus 5.5 (H) 2.5 - 4.6 mg/dL    Current Facility-Administered Medications  Medication Dose Route Frequency Provider Last Rate Last Dose  . acetaminophen (TYLENOL) tablet 650 mg  650 mg Oral Once Nance Pear, MD   Stopped at 06/30/16 1021  . acetaminophen (TYLENOL) tablet 650 mg  650 mg Oral Q6H PRN Lisa Roca, MD   650 mg at 08/24/16 1006  . amLODipine (NORVASC) tablet 10 mg  10 mg Oral QPM Lisa Roca, MD   10 mg at 09/10/16 1842  . aspirin chewable tablet 81 mg  81 mg Oral Daily Lisa Roca, MD   81 mg at 09/09/16 1400  . cholecalciferol (VITAMIN D) tablet 800 Units  800 Units Oral Daily Lisa Roca, MD   800 Units at 09/09/16 1404  . cloNIDine (CATAPRES) tablet 0.1 mg  0.1 mg Oral BID Lisa Roca, MD   0.1 mg at 09/10/16 2144  . divalproex (DEPAKOTE) DR tablet 1,000 mg  1,000 mg Oral QHS Lisa Roca, MD   1,000 mg at 09/10/16 2144  . epoetin alfa (EPOGEN,PROCRIT) injection 4,000 Units  4,000 Units Intravenous Q M,W,F-HD Lisa Roca, MD   4,000 Units at 09/10/16 1507  . escitalopram (LEXAPRO) tablet 15 mg  15 mg Oral Daily Lisa Roca, MD   15 mg at 09/09/16 1359  . feeding supplement (NEPRO CARB STEADY) liquid 237 mL  237 mL Oral BID BM Harvest Dark, MD 0 mL/hr at 09/08/16 1030 237 mL at 09/10/16 0940  . heparin injection 1,500 Units  20 Units/kg Dialysis PRN Lisa Roca, MD      . hydrALAZINE (APRESOLINE) tablet 100 mg  100 mg Oral TID Lisa Roca, MD   100 mg at 09/10/16 2145  . insulin aspart (novoLOG) injection 0-15 Units  0-15 Units Subcutaneous TID WC Lisa Roca, MD   5 Units at 09/10/16  1840  . insulin aspart (novoLOG) injection 0-5 Units  0-5 Units Subcutaneous QHS Lisa Roca, MD   3 Units at 09/10/16 2151  . irbesartan (AVAPRO) tablet 300 mg  300 mg Oral QHS Lisa Roca, MD   300 mg at 09/10/16 2144  . metoprolol (LOPRESSOR) tablet 50 mg  50 mg Oral BID Lisa Roca, MD   50 mg at 09/10/16 2145  . patiromer Daryll Drown) packet 8.4 g  8.4 g Oral Q24H Munsoor Lateef, MD   8.4 g at 09/09/16 0831  . QUEtiapine (SEROQUEL) tablet 200 mg  200 mg Oral BID Lisa Roca, MD   200 mg at 09/10/16 2144  . traZODone (DESYREL) tablet 100 mg  100 mg Oral QHS Lisa Roca, MD   100 mg at 09/10/16 2145   Current Outpatient Prescriptions  Medication Sig Dispense Refill  . amLODipine (NORVASC) 10 MG tablet Take 10 mg by mouth at bedtime. *hold for systolic blood pressure <092, call MD if holding this medication.*    . aspirin 81 MG chewable tablet Chew 81 mg by mouth daily.    . B Complex-C-Folic Acid (RENA-VITE RX PO) Take 1 tablet by mouth daily.    . calcium acetate (PHOSLO) 667 MG capsule Take 1,334 mg by mouth 2 (two) times daily with a meal.     . cinacalcet (SENSIPAR) 60  MG tablet Take 60 mg by mouth daily.    . cloNIDine (CATAPRES) 0.2 MG tablet Take 0.2 mg by mouth 2 (two) times daily.    . divalproex (DEPAKOTE) 500 MG DR tablet Take 1,000 mg by mouth at bedtime.     . docusate sodium (COLACE) 100 MG capsule Take 100 mg by mouth at bedtime.     Marland Kitchen escitalopram (LEXAPRO) 10 MG tablet Take 15 mg by mouth daily.    . folic acid (FOLVITE) 1 MG tablet Take 2 mg by mouth at bedtime.     . insulin aspart (NOVOLOG) 100 UNIT/ML injection Inject 0-12 Units into the skin 3 (three) times daily before meals. Per sliding scale    . lidocaine-prilocaine (EMLA) cream Apply 1 application topically 3 (three) times a week. Apply to access site 30 minutes before dialysis.    Marland Kitchen medroxyPROGESTERone (DEPO-PROVERA) 150 MG/ML injection Inject 150 mg into the muscle every 3 (three) months.    . metoprolol  (LOPRESSOR) 50 MG tablet Take 50 mg by mouth 2 (two) times daily.    . Multiple Vitamin (THEREMS PO) Take 1 tablet by mouth daily.    . QUEtiapine (SEROQUEL) 200 MG tablet Take 200 mg by mouth 2 (two) times daily.     . traZODone (DESYREL) 100 MG tablet Take 100 mg by mouth at bedtime.    . vitamin B-12 (CYANOCOBALAMIN) 1000 MCG tablet Take 1,000 mcg by mouth at bedtime.       Musculoskeletal: Strength & Muscle Tone: within normal limits Gait & Station: normal Patient leans: N/A  Psychiatric Specialty Exam: Physical Exam  Nursing note and vitals reviewed. Constitutional: She appears well-developed and well-nourished.  HENT:  Head: Normocephalic and atraumatic.  Eyes: Conjunctivae are normal. Pupils are equal, round, and reactive to light.  Neck: Normal range of motion.  Cardiovascular: Regular rhythm and normal heart sounds.   Respiratory: Effort normal. No respiratory distress.  GI: Soft.  Musculoskeletal: Normal range of motion.  Neurological: She is alert.  Skin: Skin is warm and dry.  Psychiatric: She has a normal mood and affect. Her speech is normal and behavior is normal. Thought content is not paranoid. She does not express impulsivity or inappropriate judgment. She expresses no homicidal and no suicidal ideation. She expresses no suicidal plans. She exhibits normal recent memory and normal remote memory.    Review of Systems  Constitutional: Negative.   HENT: Negative.   Eyes: Negative.   Respiratory: Negative.   Cardiovascular: Negative.   Gastrointestinal: Negative.   Musculoskeletal: Negative.   Skin: Negative.   Neurological: Negative.   Psychiatric/Behavioral: Negative for depression, hallucinations, memory loss, substance abuse and suicidal ideas. The patient is not nervous/anxious and does not have insomnia.     Blood pressure 129/69, pulse 96, temperature 99 F (37.2 C), temperature source Oral, resp. rate 18, height '5\' 2"'$  (1.575 m), weight 80.7 kg (177 lb  14.6 oz), SpO2 100 %.Body mass index is 32.54 kg/m.  General Appearance: Fairly Groomed  Eye Contact:  Fair  Speech:  Normal Rate  Volume:  Decreased  Mood:  Euthymic  Affect:  Constricted  Thought Process:  Coherent and Descriptions of Associations: Circumstantial  Orientation:  Full (Time, Place, and Person)  Thought Content:  Patient's thought content tends to be somewhat childish and concrete but there is no evidence of psychotic thinking.  Suicidal Thoughts:  No  Homicidal Thoughts:  No  Memory:  Immediate;   Good Recent;   Fair Remote;   Fair  Judgement:  Impaired  Insight:  Shallow  Psychomotor Activity:  Restlessness  Concentration:  Concentration: Poor  Recall:  Poor  Fund of Knowledge:  Poor  Language:  Fair  Akathisia:  No  Handed:  Right  AIMS (if indicated):     Assets:  Communication Skills Desire for Improvement Housing Resilience Social Support  ADL's:  Intact  Cognition:  Impaired,  Moderate  Sleep:        Treatment Plan Summary: Plan No change to medication. Patient is psychiatrically stable. I am doing what I can to assist social work in getting things ready for the proposed discharge to a local group home this week.  Disposition: Patient does not meet criteria for psychiatric inpatient admission. Supportive therapy provided about ongoing stressors.  Alethia Berthold, MD 09/11/2016 12:04 PM

## 2016-09-12 LAB — GLUCOSE, CAPILLARY
GLUCOSE-CAPILLARY: 93 mg/dL (ref 65–99)
Glucose-Capillary: 162 mg/dL — ABNORMAL HIGH (ref 65–99)
Glucose-Capillary: 176 mg/dL — ABNORMAL HIGH (ref 65–99)

## 2016-09-12 LAB — RENAL FUNCTION PANEL
ALBUMIN: 3.9 g/dL (ref 3.5–5.0)
ANION GAP: 15 (ref 5–15)
BUN: 46 mg/dL — AB (ref 6–20)
CHLORIDE: 93 mmol/L — AB (ref 101–111)
CO2: 28 mmol/L (ref 22–32)
Calcium: 9.9 mg/dL (ref 8.9–10.3)
Creatinine, Ser: 7.12 mg/dL — ABNORMAL HIGH (ref 0.44–1.00)
GFR calc Af Amer: 8 mL/min — ABNORMAL LOW (ref >60)
GFR calc non Af Amer: 7 mL/min — ABNORMAL LOW (ref >60)
GLUCOSE: 359 mg/dL — AB (ref 65–99)
PHOSPHORUS: 5.2 mg/dL — AB (ref 2.5–4.6)
POTASSIUM: 4.4 mmol/L (ref 3.5–5.1)
Sodium: 136 mmol/L (ref 135–145)

## 2016-09-12 LAB — CBC
HEMATOCRIT: 31.2 % — AB (ref 35.0–47.0)
HEMOGLOBIN: 10.6 g/dL — AB (ref 12.0–16.0)
MCH: 32.1 pg (ref 26.0–34.0)
MCHC: 33.8 g/dL (ref 32.0–36.0)
MCV: 94.9 fL (ref 80.0–100.0)
Platelets: 170 10*3/uL (ref 150–440)
RBC: 3.29 MIL/uL — ABNORMAL LOW (ref 3.80–5.20)
RDW: 13.9 % (ref 11.5–14.5)
WBC: 4.6 10*3/uL (ref 3.6–11.0)

## 2016-09-12 MED ORDER — PROMETHAZINE HCL 25 MG/ML IJ SOLN
INTRAMUSCULAR | Status: AC
  Administered 2016-09-12: 12.5 mg via INTRAMUSCULAR
  Filled 2016-09-12: qty 1

## 2016-09-12 MED ORDER — PROMETHAZINE HCL 25 MG/ML IJ SOLN
12.5000 mg | Freq: Once | INTRAMUSCULAR | Status: AC
  Administered 2016-09-12: 12.5 mg via INTRAMUSCULAR

## 2016-09-12 NOTE — Progress Notes (Signed)
Pre Dialysis 

## 2016-09-12 NOTE — Progress Notes (Signed)
Dialysis complete

## 2016-09-12 NOTE — Progress Notes (Signed)
CSW received a call from Scarlette Shorts (pt's [facility] care Coordinator) [phone]. Tsosie Billing states that there has been a delay in the paperwork because Ms. Watlington has been turning in pieces one at a time instead of sending everything together. Tsosie Billing states that to her knowledge the only paperwork missing at this time is the PCP and updated FL2. CSW signed PCP for Ms. Watlington yesterday and gave Ms. Watlington an updated FL2 yesterday, so at this point everything should be completed and submitted. Awaiting [facility]'s approval of the single case agreement.   CSW will continue to follow pt and assist as needed.  [name], MSW, [place]

## 2016-09-12 NOTE — ED Notes (Signed)
Pt to dialysis.

## 2016-09-12 NOTE — ED Notes (Signed)
meds administered as ordered  Assessment completed  Nausea improved

## 2016-09-12 NOTE — ED Notes (Signed)
BEHAVIORAL HEALTH ROUNDING Patient sleeping: No. Patient alert and oriented: yes Behavior appropriate: Yes.  ; If no, describe:  Nutrition and fluids offered: yes Toileting and hygiene offered: Yes  Sitter present: q15 minute observations and security  monitoring Law enforcement present: Yes  ODS  

## 2016-09-12 NOTE — ED Provider Notes (Signed)
-----------------------------------------   7:12 AM on 09/12/2016 -----------------------------------------   Blood pressure (!) 145/76, pulse 100, temperature 98.6 F (37 C), temperature source Oral, resp. rate 20, height 5\' 2"  (1.575 m), weight 173 lb 4.5 oz (78.6 kg), SpO2 99 %.  The patient had no acute events since last update.  Calm and cooperative at this time.  Disposition is pending Psychiatry/Behavioral Medicine team recommendations.     Merlyn Lot, MD 09/12/16 [phone]

## 2016-09-12 NOTE — Progress Notes (Signed)
Dialysis started 

## 2016-09-12 NOTE — Consult Note (Signed)
  Psychiatry: Patient seen. Chart reviewed. Patient remains asymptomatic. Behavior remains remarkably good considering her frustrating situation. Everything I have heard is that we are likely to have a successful discharge sometime this week. Supportive counseling to the patient and encouragement. No change to medicine.

## 2016-09-12 NOTE — ED Notes (Signed)
She has returned from dialysis - she is actively vomiting at this time   IM phenergan to be administered   She cannot tolerate PO meds at this time

## 2016-09-12 NOTE — Progress Notes (Signed)
Subjective:  Potassium finallyd own to 4.4 today. Pt to have access declotted today. Resting comfortably during dialysis. BP under good control at 119/94.    Objective:  Vital signs in last 24 hours:  Temp:  [98.2 F (36.8 C)-99.3 F (37.4 C)] 99.3 F (37.4 C) (10/18 1005) Pulse Rate:  [95-123] 120 (10/18 1100) Resp:  [11-20] 16 (10/18 1100) BP: (108-155)/(67-101) 119/94 (10/18 1100) SpO2:  [98 %-100 %] 100 % (10/18 1100) Weight:  [78.4 kg (172 lb 13.5 oz)-78.6 kg (173 lb 4.5 oz)] 78.4 kg (172 lb 13.5 oz) (10/18 1005)  Weight change: 1.3 kg (2 lb 13.9 oz) Filed Weights   09/11/16 0955 09/11/16 1342 09/12/16 1005  Weight: 80.7 kg (177 lb 14.6 oz) 78.6 kg (173 lb 4.5 oz) 78.4 kg (172 lb 13.5 oz)    Intake/Output:    Intake/Output Summary (Last 24 hours) at 09/12/16 1120 Last data filed at 09/11/16 1342  Gross per 24 hour  Intake                0 ml  Output             1500 ml  Net            -1500 ml     Physical Exam: General: No acute distress  HEENT [place]/AT hearing intact  Neck Supple   Pulm/lungs Normal breathing effort, clear to auscultation   CVS/Heart Regular rhythm, no rub or gallop, Systolic murmur present  Abdomen:  Soft, nontender, nondistended   Extremities: No peripheral edema   Neurologic: Awake, alert, following commands  Skin: No acute rashes   Access: Left upper arm AV fistula, temporary right femoral dialysis catheter       Basic Metabolic Panel:   Recent Labs Lab 09/07/16 1450 09/10/16 1137 09/11/16 1010 09/12/16 1015  NA 134* 133* 135 136  K 5.2* 6.8* 5.7* 4.4  CL 92* 92* 93* 93*  CO2 27 26 28 28   GLUCOSE 257* 208* 276* 359*  BUN 102* 99* 76* 46*  CREATININE 9.92* 10.90* 8.96* 7.12*  CALCIUM 9.7 9.9 9.7 9.9  PHOS 4.1 4.4 5.5* 5.2*     CBC:  Recent Labs Lab 09/07/16 1450 09/10/16 1137 09/12/16 1015  WBC 4.4 4.7 4.6  HGB 9.9* 10.9* 10.6*  HCT 28.8* 31.3* 31.2*  MCV 94.3 94.1 94.9  PLT 131* 181 170       Microbiology:  No results found for this or any previous visit (from the past 720 hour(s)).  Coagulation Studies: No results for input(s): LABPROT, INR in the last 72 hours.  Urinalysis: No results for input(s): COLORURINE, LABSPEC, PHURINE, GLUCOSEU, HGBUR, BILIRUBINUR, KETONESUR, PROTEINUR, UROBILINOGEN, NITRITE, LEUKOCYTESUR in the last 72 hours.  Invalid input(s): APPERANCEUR    Imaging: No results found.   Medications:     . acetaminophen  650 mg Oral Once  . amLODipine  10 mg Oral QPM  . aspirin  81 mg Oral Daily  . cholecalciferol  800 Units Oral Daily  . cloNIDine  0.1 mg Oral BID  . divalproex  1,000 mg Oral QHS  . epoetin (EPOGEN/PROCRIT) injection  4,000 Units Intravenous Q M,W,F-HD  . escitalopram  15 mg Oral Daily  . feeding supplement (NEPRO CARB STEADY)  237 mL Oral BID BM  . hydrALAZINE  100 mg Oral TID  . insulin aspart  0-15 Units Subcutaneous TID WC  . insulin aspart  0-5 Units Subcutaneous QHS  . irbesartan  300 mg Oral QHS  . metoprolol tartrate  50 mg Oral BID  . patiromer  8.4 g Oral Q24H  . QUEtiapine  200 mg Oral BID  . traZODone  100 mg Oral QHS   acetaminophen, heparin  Assessment/ Plan:  35 y.o. African-American female with developmental delay, ESRD, AOCD, SHPTH, HTN, schizophrenia, mild cognitive impairment, diabetes mellitus type II  [facility] MWF [address]  1. Hyperkalemia:  K finally under control today at 4.4, ok to proceed with declot of access.   2. Hypertension:  - Amlodipine, clonidine, hydralazine, irbesartan, metoprolol - Blood pressure well controlled at present at 119/94.  3. Anemia of chronic kidney disease  - Hemoglobin up to 10.6, continue epogen.   4. Secondary Hyperparathyroidism - Phosphorous down to 5.2, continue to monitor.  5. End Stage Renal Disease: on hemodialysis MWF schedule.  - pt now back on regular MWF schedule, had extra treatment on Tuesday.  Access to be declotted today.  Will need  removal of right temporary femoral dialysis catheter once access patent again.  6. DM-2 with CKD Insulin dependent    LOS: 0 Osby Sweetin 10/18/201711:20 AM

## 2016-09-12 NOTE — Progress Notes (Signed)
Post dialysis 

## 2016-09-12 NOTE — ED Notes (Addendum)
Pt. back from dialysis

## 2016-09-12 NOTE — ED Notes (Signed)
ED BHU [place] Is the patient under IVC or is there intent for IVC:  No  voluntary Is the patient medically cleared: Yes.   Is there vacancy in the ED BHU: Yes.   Is the population mix appropriate for patient: Yes.   Is the patient awaiting placement in inpatient or outpatient setting: Yes.   Has the patient had a psychiatric consult: Yes.   Survey of unit performed for contraband, proper placement and condition of furniture, tampering with fixtures in bathroom, shower, and each patient room: Yes.  ; Findings:  APPEARANCE/BEHAVIOR Calm and cooperative NEURO ASSESSMENT Orientation: oriented x 4 Denies pain Hallucinations: No.None noted (Hallucinations) Speech: Normal Gait: normal RESPIRATORY ASSESSMENT Even  Unlabored respirations  CARDIOVASCULAR ASSESSMENT Pulses equal   regular rate  Skin warm and dry   GASTROINTESTINAL ASSESSMENT no GI complaint EXTREMITIES Full ROM  PLAN OF CARE Provide calm/safe environment. Vital signs assessed twice daily. ED BHU Assessment once each 12-hour shift. Collaborate with TTS daily or as condition indicates. Assure the ED provider has rounded once each shift. Provide and encourage hygiene. Provide redirection as needed. Assess for escalating behavior; address immediately and inform ED provider.  Assess family dynamic and appropriateness for visitation as needed: Yes.  ; If necessary, describe findings:  Educate the patient/family about BHU procedures/visitation: Yes.  ; If necessary, describe findings:

## 2016-09-13 ENCOUNTER — Other Ambulatory Visit (INDEPENDENT_AMBULATORY_CARE_PROVIDER_SITE_OTHER): Payer: Self-pay | Admitting: Vascular Surgery

## 2016-09-13 LAB — GLUCOSE, CAPILLARY
Glucose-Capillary: 148 mg/dL — ABNORMAL HIGH (ref 65–99)
Glucose-Capillary: 259 mg/dL — ABNORMAL HIGH (ref 65–99)
Glucose-Capillary: 195 mg/dL — ABNORMAL HIGH (ref 65–99)
Glucose-Capillary: 202 mg/dL — ABNORMAL HIGH (ref 65–99)

## 2016-09-13 MED ORDER — CEFAZOLIN IN D5W 1 GM/50ML IV SOLN
1.0000 g | Freq: Once | INTRAVENOUS | Status: DC
  Filled 2016-09-13: qty 50

## 2016-09-13 NOTE — ED Notes (Signed)
Crystal, EDT at bedside at this time to assist patient with getting a bath.

## 2016-09-13 NOTE — ED Notes (Signed)
Pt taken outside by [place], [place].

## 2016-09-13 NOTE — ED Notes (Signed)
MD notified that due to patient's BP, pt would not receive BP medications, pt is however noted to be mildly tachycardic at 106. Per MD will continue to monitor.

## 2016-09-13 NOTE — ED Notes (Signed)
Pt returned from being outside with [place], [place].

## 2016-09-13 NOTE — ED Provider Notes (Signed)
-----------------------------------------   7:01 AM on 09/13/2016 -----------------------------------------   Blood pressure 110/74, pulse 93, temperature 98.4 F (36.9 C), temperature source Oral, resp. rate 16, height 5\' 2"  (1.575 m), weight 76.9 kg, SpO2 98 %.  Glucose has improved. Will monitor.  The patient had no acute events since last update.  Calm and cooperative at this time.  Disposition is pending Psychiatry/Behavioral Medicine team recommendations.      Lavonia Drafts, MD 09/13/16 [phone]

## 2016-09-13 NOTE — Progress Notes (Signed)
CSW took pt outside for some fresh air. Pt is in good spirits today and talked to CSW about how her day has been. Pt has been sick for the past few days but states that she feels better today. No vomiting reported. CSW will continue to follow pt and assist as needed.  [name], MSW, [place]

## 2016-09-13 NOTE — ED Notes (Signed)
Pt given another meal tray at this time after refusing other meal tray that was sent up for pt. Pt also informed that she would not get another meal tray if she refused one given to her.

## 2016-09-13 NOTE — ED Notes (Signed)
This RN spoke with Dr. Corky Downs regarding patient NPO status, verbal orders received to reinstate patient's renal/carb modified diet at this time. This RN will hold patient's insulin until food tray is received.

## 2016-09-13 NOTE — ED Notes (Addendum)
MD made aware of patient's BP and HR. Per MD, give dose of metoprolol.

## 2016-09-13 NOTE — ED Notes (Signed)
Report received from Butch, RN 

## 2016-09-13 NOTE — ED Notes (Signed)
Pt given lunch tray at this time. NAD noted.

## 2016-09-14 ENCOUNTER — Encounter
Admission: EM | Disposition: A | Discharge status: Home / Self Care | Payer: Self-pay | Source: Home / Self Care | Attending: Emergency Medicine

## 2016-09-14 ENCOUNTER — Encounter: Payer: Self-pay | Admitting: *Deleted

## 2016-09-14 DIAGNOSIS — T82858A Stenosis of vascular prosthetic devices, implants and grafts, initial encounter: Secondary | ICD-10-CM | POA: Diagnosis not present

## 2016-09-14 DIAGNOSIS — N186 End stage renal disease: Secondary | ICD-10-CM | POA: Diagnosis not present

## 2016-09-14 HISTORY — PX: PERIPHERAL VASCULAR CATHETERIZATION: SHX172C

## 2016-09-14 LAB — GLUCOSE, CAPILLARY
Glucose-Capillary: 166 mg/dL — ABNORMAL HIGH (ref 65–99)
Glucose-Capillary: 239 mg/dL — ABNORMAL HIGH (ref 65–99)
Glucose-Capillary: 343 mg/dL — ABNORMAL HIGH (ref 65–99)

## 2016-09-14 LAB — PHOSPHORUS: Phosphorus: 4.9 mg/dL — ABNORMAL HIGH (ref 2.5–4.6)

## 2016-09-14 LAB — POTASSIUM: POTASSIUM: 4.9 mmol/L (ref 3.5–5.1)

## 2016-09-14 SURGERY — THROMBECTOMY
Anesthesia: Moderate Sedation | Laterality: Left

## 2016-09-14 MED ORDER — MIDAZOLAM HCL 2 MG/2ML IJ SOLN
INTRAMUSCULAR | Status: AC
  Filled 2016-09-14: qty 2

## 2016-09-14 MED ORDER — DEXTROSE 5 % IV SOLN
1.5000 g | Freq: Once | INTRAVENOUS | Status: AC
  Administered 2016-09-14: 1.5 g via INTRAVENOUS

## 2016-09-14 MED ORDER — HEPARIN SODIUM (PORCINE) 1000 UNIT/ML IJ SOLN
INTRAMUSCULAR | Status: AC
  Filled 2016-09-14: qty 1

## 2016-09-14 MED ORDER — DEXTROSE 5 % IV SOLN
INTRAVENOUS | Status: AC
  Filled 2016-09-14: qty 1.5

## 2016-09-14 MED ORDER — HEPARIN SODIUM (PORCINE) 1000 UNIT/ML IJ SOLN
INTRAMUSCULAR | Status: DC | PRN
  Administered 2016-09-14: 3000 [IU] via INTRAVENOUS

## 2016-09-14 MED ORDER — DIPHENHYDRAMINE HCL 50 MG/ML IJ SOLN
INTRAMUSCULAR | Status: AC
  Filled 2016-09-14: qty 1

## 2016-09-14 MED ORDER — FENTANYL CITRATE (PF) 100 MCG/2ML IJ SOLN
INTRAMUSCULAR | Status: AC
  Filled 2016-09-14: qty 2

## 2016-09-14 MED ORDER — LIDOCAINE-EPINEPHRINE (PF) 1 %-1:200000 IJ SOLN
INTRAMUSCULAR | Status: AC
  Filled 2016-09-14: qty 30

## 2016-09-14 MED ORDER — HEPARIN (PORCINE) IN NACL 2-0.9 UNIT/ML-% IJ SOLN
INTRAMUSCULAR | Status: AC
  Filled 2016-09-14: qty 1000

## 2016-09-14 MED ORDER — DIPHENHYDRAMINE HCL 50 MG/ML IJ SOLN
INTRAMUSCULAR | Status: DC | PRN
  Administered 2016-09-14: 50 mg via INTRAVENOUS

## 2016-09-14 MED ORDER — IOPAMIDOL (ISOVUE-300) INJECTION 61%
INTRAVENOUS | Status: DC | PRN
  Administered 2016-09-14: 25 mL via INTRA_ARTERIAL

## 2016-09-14 MED ORDER — MIDAZOLAM HCL 2 MG/2ML IJ SOLN
INTRAMUSCULAR | Status: DC | PRN
  Administered 2016-09-14 (×2): 2 mg via INTRAVENOUS

## 2016-09-14 MED ORDER — FENTANYL CITRATE (PF) 100 MCG/2ML IJ SOLN
INTRAMUSCULAR | Status: DC | PRN
  Administered 2016-09-14 (×2): 100 ug via INTRAVENOUS

## 2016-09-14 SURGICAL SUPPLY — 8 items
BALLN DORADO 8X60X80 (BALLOONS) ×3
BALLOON DORADO 8X60X80 (BALLOONS) ×1 IMPLANT
DEVICE PRESTO INFLATION (MISCELLANEOUS) ×3 IMPLANT
NEEDLE ENTRY 21GA 7CM ECHOTIP (NEEDLE) ×3 IMPLANT
PACK ANGIOGRAPHY (CUSTOM PROCEDURE TRAY) ×3 IMPLANT
SHIELD RADPAD DADD DRAPE 4X9 (MISCELLANEOUS) ×3 IMPLANT
TOWEL OR 17X26 4PK STRL BLUE (TOWEL DISPOSABLE) ×3 IMPLANT
WIRE MAGIC TOR.035 180C (WIRE) ×3 IMPLANT

## 2016-09-14 NOTE — ED Provider Notes (Signed)
-----------------------------------------   6:42 AM on 09/14/2016 -----------------------------------------   Blood pressure (!) 143/95, pulse 89, temperature 98.6 F (37 C), temperature source Oral, resp. rate 18, height 5\' 2"  (1.575 m), weight 169 lb 8.5 oz (76.9 kg), SpO2 98 %.  The patient had no acute events since last update.  Calm and cooperative at this time.  Disposition is pending CSW team recommendations.     Paulette Blanch, MD 09/14/16 [phone]

## 2016-09-14 NOTE — ED Notes (Signed)
Report given to [facility] in special procedures

## 2016-09-14 NOTE — Progress Notes (Signed)
LCSW met with patient and gave her hug as she will have dialysis and surgery today. She remains positive and was playful and kind. She is very happy she met with Mr and Mrs Oswaldo Milian and will await her placment.   [employer] LCSW [phone]

## 2016-09-14 NOTE — ED Notes (Signed)
Pt sleeping, resps unlabored, skin normal color warm and dry.

## 2016-09-14 NOTE — ED Notes (Signed)
Repeat vital signs obtained, pt states "i'm tired, leave me alone." skin normal color warm and dry. Pt denies pain. Dressing in place to left upper arm dialysis access area. 3+ radial pulses noted.

## 2016-09-14 NOTE — H&P (Signed)
[facility] History & Physical Update  The patient was interviewed and re-examined.  The patient's previous History and Physical has been reviewed and is unchanged.  There is no change in the plan of care. We plan to proceed with the scheduled procedure.  Hortencia Pilar, MD  09/14/2016, 1:32 PM

## 2016-09-14 NOTE — Op Note (Signed)
OPERATIVE NOTE   PROCEDURE: 1. Contrast injection left arm hero  AV access 2. Percutaneous transluminal angioplasty midportion of the graft to 8 mm  PRE-OPERATIVE DIAGNOSIS: Complication of dialysis access                                                       End Stage Renal Disease  POST-OPERATIVE DIAGNOSIS: same as above   SURGEON: Katha Cabal, M.D.  ANESTHESIA: Conscious sedation was administered under my direct supervision. IV Versed plus fentanyl were utilized. Continuous ECG, pulse oximetry and blood pressure was monitored throughout the entire procedure.  Conscious sedation was for a total of 50.  ESTIMATED BLOOD LOSS: minimal  FINDING(S): Stricture of the AV graft  SPECIMEN(S):  None  CONTRAST: 25 cc  FLUOROSCOPY TIME: 1.6 minutes  INDICATIONS: Kelly [name] is a 35 y.o. female who  presents with malfunctioning left arm hero AV access.  The patient is scheduled for angiography with possible intervention of the AV access.  The patient is aware the risks include but are not limited to: bleeding, infection, thrombosis of the cannulated access, and possible anaphylactic reaction to the contrast.  The patient acknowledges if the access can not be salvaged a tunneled catheter will be needed and will be placed during this procedure.  The patient is aware of the risks of the procedure and elects to proceed with the angiogram and intervention.  DESCRIPTION: After full informed written consent was obtained, the patient was brought back to the Special Procedure suite and placed supine position.  Appropriate cardiopulmonary monitors were placed.  The left arm was prepped and draped in the standard fashion.  Appropriate timeout is called. The left hero graft  was cannulated with a micropuncture needle.  Cannulation was performed with ultrasound guidance. Ultrasound was placed in a sterile sleeve, the AV access was interrogated and noted to be echolucent and compressible indicating  patency. Image was recorded for the permanent record. The puncture is performed under continuous ultrasound visualization.   The microwire was advanced and the needle was exchanged for  a microsheath.  The J-wire was then advanced and a 6 Fr sheath inserted.  Hand injections were completed to image the access from the arterial anastomosis through the entire access.  The central venous structures were also imaged by hand injections.  Based on the images,  3000 units of heparin was given and a wire was negotiated through the strictures within the venous portion of the graft.  An 8 x 60 [place] balloon was used.  Inflation was to 16 atm for 1 minute.  Follow-up imaging demonstrates complete resolution of the stricture with rapid flow of contrast through the graft, the central venous anatomy is preserved.  A 4-0 Monocryl purse-string suture was sewn around the sheath.  The sheath was removed and light pressure was applied.  A sterile bandage was applied to the puncture site.  Interpretation:  The hero graft is in good position there appears to be narrowing at the grommet. There also is marked irregularity with narrowing within the venous portion of the graft that has been stented previously. Arterial anastomosis is widely patent. Following angioplasty there is improvement in the venous portion of the graft without evidence of stricture stenosis this is also true at the area of the grommet   COMPLICATIONS: None  CONDITION: Kelly [name], M.D [facility] Office: [phone]  09/14/2016 2:44 PM

## 2016-09-14 NOTE — ED Notes (Signed)
Spoke with Sharyn Lull in vascular to let her know pts potassium level was WNL. Pt will be taken to OR within the hour per michelle.

## 2016-09-14 NOTE — ED Notes (Signed)
Pt taken to dialysis via wheelchair with EDT. Pt refused to have treatment while there and pt was brought back to er. Spoke with michelle from vascular and pt is not able to have vascular surgery without dialysis or a potassium level. Potassium level to be drawn by lab and will notify vascular as soon as results are back.

## 2016-09-14 NOTE — ED Notes (Signed)
Pt back from OR

## 2016-09-14 NOTE — ED Notes (Signed)
Pt transported to dialysis by this tech

## 2016-09-14 NOTE — Progress Notes (Signed)
LCSW received call from [facility], she reported that [facility] is currently reviewing [facility] and all documentation from [name] has been received by [employer] and they will review today. According to Ms Geoffry Paradise we a close to having the group home approved and possible discharge dat for next week. ( No dates were promised)   [employer] LCSW [phone]

## 2016-09-14 NOTE — ED Notes (Signed)
CBG 166 

## 2016-09-15 LAB — GLUCOSE, CAPILLARY
GLUCOSE-CAPILLARY: 225 mg/dL — AB (ref 65–99)
Glucose-Capillary: 156 mg/dL — ABNORMAL HIGH (ref 65–99)
Glucose-Capillary: 150 mg/dL — ABNORMAL HIGH (ref 65–99)
Glucose-Capillary: 316 mg/dL — ABNORMAL HIGH (ref 65–99)

## 2016-09-15 MED ORDER — HYDRALAZINE HCL 50 MG PO TABS
25.0000 mg | ORAL_TABLET | Freq: Three times a day (TID) | ORAL | Status: DC
  Administered 2016-09-15 – 2016-09-22 (×13): 25 mg via ORAL
  Filled 2016-09-15 (×2): qty 1
  Filled 2016-09-15: qty 2
  Filled 2016-09-15 (×11): qty 1
  Filled 2016-09-15: qty 2
  Filled 2016-09-15: qty 1
  Filled 2016-09-15: qty 2

## 2016-09-15 NOTE — Progress Notes (Signed)
Dialysis complete

## 2016-09-15 NOTE — ED Notes (Signed)
Spoke with charlie, rn that is on call for dialysis. Charlie recommends md to flush each large port with 70mL of ns to see if lines flush and contact dialysis rn in am at 0800.

## 2016-09-15 NOTE — ED Notes (Signed)
Dr. Dahlia Client notified of recommendation of dialysis rn. Pt is declining am medications at this time.

## 2016-09-15 NOTE — Progress Notes (Signed)
Hd start 

## 2016-09-15 NOTE — ED Notes (Signed)
BEHAVIORAL HEALTH ROUNDING  Patient sleeping: No.  Patient alert and oriented: yes  Behavior appropriate: Yes. ; If no, describe:  Nutrition and fluids offered: Yes  Toileting and hygiene offered: Yes  Sitter present: not applicable, Q 15 min safety rounds and observation.  Law enforcement present: Yes ODS  

## 2016-09-15 NOTE — Progress Notes (Signed)
Subjective:  Patient underwent successful angioplasty of a access on Friday She refused undergoing dialysis yesterday and refused to come down for dialysis this morning Been asked for that reason she said that she just didn't want to.  She states she's cramping in her legs. Patient was offered popcorn which made her agree to undergo dialysis.  Objective:  Vital signs in last 24 hours:  Temp:  [97.7 F (36.5 C)] 97.7 F (36.5 C) (10/20 1917) Pulse Rate:  [90-104] 90 (10/21 0544) Resp:  [16-24] 16 (10/21 0544) BP: (113-149)/(72-116) 140/78 (10/21 0544) SpO2:  [96 %-100 %] 96 % (10/20 2029)  Weight change:  Filed Weights   09/11/16 1342 09/12/16 1005 09/12/16 1353  Weight: 78.6 kg (173 lb 4.5 oz) 78.4 kg (172 lb 13.5 oz) 76.9 kg (169 lb 8.5 oz)    Intake/Output:    Intake/Output Summary (Last 24 hours) at 09/15/16 1324 Last data filed at 09/14/16 2150  Gross per 24 hour  Intake              120 ml  Output                0 ml  Net              120 ml     Physical Exam: General: No acute distress  HEENT [place]/AT hearing intact  Neck Supple   Pulm/lungs Normal breathing effort, clear to auscultation   CVS/Heart Regular rhythm, no rub or gallop, Systolic murmur present  Abdomen:  Soft, nontender, nondistended   Extremities: No peripheral edema   Neurologic: Awake, alert, following commands  Skin: No acute rashes   Access: Left upper arm AVG, temporary right femoral dialysis catheter       Basic Metabolic Panel:   Recent Labs Lab 09/10/16 1137 09/11/16 1010 09/12/16 1015 09/14/16 1006  NA 133* 135 136  --   K 6.8* 5.7* 4.4 4.9  CL 92* 93* 93*  --   CO2 26 28 28   --   GLUCOSE 208* 276* 359*  --   BUN 99* 76* 46*  --   CREATININE 10.90* 8.96* 7.12*  --   CALCIUM 9.9 9.7 9.9  --   PHOS 4.4 5.5* 5.2* 4.9*     CBC:  Recent Labs Lab 09/10/16 1137 09/12/16 1015  WBC 4.7 4.6  HGB 10.9* 10.6*  HCT 31.3* 31.2*  MCV 94.1 94.9  PLT 181 170       Microbiology:  No results found for this or any previous visit (from the past 720 hour(s)).  Coagulation Studies: No results for input(s): LABPROT, INR in the last 72 hours.  Urinalysis: No results for input(s): COLORURINE, LABSPEC, PHURINE, GLUCOSEU, HGBUR, BILIRUBINUR, KETONESUR, PROTEINUR, UROBILINOGEN, NITRITE, LEUKOCYTESUR in the last 72 hours.  Invalid input(s): APPERANCEUR    Imaging: No results found.   Medications:   .  ceFAZolin (ANCEF) IV Stopped (09/13/16 1811)   . acetaminophen  650 mg Oral Once  . amLODipine  10 mg Oral QPM  . aspirin  81 mg Oral Daily  . cholecalciferol  800 Units Oral Daily  . cloNIDine  0.1 mg Oral BID  . divalproex  1,000 mg Oral QHS  . epoetin (EPOGEN/PROCRIT) injection  4,000 Units Intravenous Q M,W,F-HD  . escitalopram  15 mg Oral Daily  . feeding supplement (NEPRO CARB STEADY)  237 mL Oral BID BM  . hydrALAZINE  100 mg Oral TID  . insulin aspart  0-15 Units Subcutaneous TID WC  .  insulin aspart  0-5 Units Subcutaneous QHS  . irbesartan  300 mg Oral QHS  . metoprolol tartrate  50 mg Oral BID  . patiromer  8.4 g Oral Q24H  . QUEtiapine  200 mg Oral BID  . traZODone  100 mg Oral QHS   acetaminophen, heparin  Assessment/ Plan:  35 y.o. African-American female with developmental delay, ESRD, AOCD, SHPTH, HTN, schizophrenia, mild cognitive impairment, diabetes mellitus type II  [facility] MWF [address]  1. Hyperkalemia:  Continued on patiromer on a daily basis  2. Hypertension:  - Amlodipine, clonidine, hydralazine, irbesartan, metoprolol - blood pressure on the low side.  Decreased the dose of hydralazine  3. Anemia of chronic kidney disease  - Hemoglobin up to 10.6, continue epogen.   4. Secondary Hyperparathyroidism - Phosphorous down to 5.2, continue to monitor.  5. End Stage Renal Disease: on hemodialysis MWF schedule.  - AV graft declotting.  Patient refused dialysis on Friday therefore rescheduled for  today.  Next dialysis treatment expected to be done Monday Will need removal of right temporary femoral dialysis catheter once access patent again.  6. DM-2 with CKD Insulin dependent    LOS: 0 Seve [place] 10/21/20171:24 PM

## 2016-09-15 NOTE — ED Notes (Signed)
Pt refused snack, gave pt a ginger ale.

## 2016-09-15 NOTE — ED Provider Notes (Signed)
-----------------------------------------   7:49 AM on 09/15/2016 -----------------------------------------   Blood pressure 140/78, pulse 90, temperature 97.7 F (36.5 C), temperature source Oral, resp. rate 16, height 5\' 2"  (1.575 m), weight 169 lb 8.5 oz (76.9 kg), SpO2 96 %.  The patient had no acute events since last update.  Calm and cooperative at this time. Patient continues to be followed by nephrology. She has patent hemodialysis catheter which was evidently flushed last night    Delman Kitten, MD 09/15/16 [phone]

## 2016-09-15 NOTE — ED Notes (Signed)
Pt continues to sleep, resps unlabored.  

## 2016-09-15 NOTE — ED Notes (Signed)
Dr. Dahlia Client notified of dialysis access appearance.

## 2016-09-15 NOTE — ED Notes (Signed)
Dr Jacqualine Code notified of pts refusal to go to dialysis or take am meds

## 2016-09-15 NOTE — Progress Notes (Signed)
Pre hd assessment  

## 2016-09-15 NOTE — ED Notes (Signed)
ENVIRONMENTAL ASSESSMENT  Potentially harmful objects out of patient reach: Yes.  Personal belongings secured: Yes.  Patient dressed in hospital provided attire only: Yes.  Plastic bags out of patient reach: Yes.  Patient care equipment (cords, cables, call bells, lines, and drains) shortened, removed, or accounted for: Yes.  Equipment and supplies removed from bottom of stretcher: Yes.  Potentially toxic materials out of patient reach: Yes.  Sharps container removed or out of patient reach: Yes.   BEHAVIORAL HEALTH ROUNDING  Patient sleeping: No.  Patient alert and oriented: yes  Behavior appropriate: Yes. ; If no, describe:  Nutrition and fluids offered: Yes  Toileting and hygiene offered: Yes  Sitter present: not applicable, Q 15 min safety rounds and observation.  Law enforcement present: Yes ODS  ED [place]  Is the patient under IVC or is there intent for IVC: No Is the patient medically cleared: Yes.  Is there vacancy in the ED BHU: Yes.  Is the population mix appropriate for patient: No Is the patient awaiting placement in inpatient or outpatient setting: Yes.  Has the patient had a psychiatric consult: Yes.  Survey of unit performed for contraband, proper placement and condition of furniture, tampering with fixtures in bathroom, shower, and each patient room: Yes. ; Findings: All clear  APPEARANCE/BEHAVIOR  calm, cooperative and adequate rapport can be established  NEURO ASSESSMENT  Orientation: time, place and person  Hallucinations: No.None noted (Hallucinations)  Speech: Normal  Gait: normal  RESPIRATORY ASSESSMENT  WNL  CARDIOVASCULAR ASSESSMENT  WNL  GASTROINTESTINAL ASSESSMENT  WNL  EXTREMITIES  WNL  PLAN OF CARE  Provide calm/safe environment. Vital signs assessed twice daily. ED BHU Assessment once each 12-hour shift. Collaborate with TTS/SW daily or as condition indicates. Assure the ED provider has rounded once each shift. Provide and  encourage hygiene. Provide redirection as needed. Assess for escalating behavior; address immediately and inform ED provider.  Assess family dynamic and appropriateness for visitation as needed: Yes. ; If necessary, describe findings:  Educate the patient/family about BHU procedures/visitation: Yes. ; If necessary, describe findings: Pt is calm and cooperative at this time. Pt understanding and accepting of unit procedures/rules. Will continue to monitor with Q 15 min safety rounds and observation.

## 2016-09-15 NOTE — ED Notes (Signed)
Pt sleeping, resps unlabored.  

## 2016-09-15 NOTE — Progress Notes (Signed)
Post dialysis 

## 2016-09-15 NOTE — ED Notes (Signed)
Report to bill, rn.  

## 2016-09-15 NOTE — Progress Notes (Signed)
Pre hd info 

## 2016-09-15 NOTE — ED Notes (Signed)
Woke pt up for dialysis. Pt said she was not going to go. Talked with pt about consequences of refusing dialysis ie electrolyte imbalance, fluid overload, possible care facilities that may refuse to accept her. Pt was insistent that she was not going.   Returned to pts room approx 10 min later to try again to convince her to go to dialysis. Pt refused.

## 2016-09-15 NOTE — ED Notes (Signed)
Dr. Dahlia Client flushed each large port of dialysis cath with 21mL of ns easily and new sterile red caps applied to lines, lines clamped by md after flushing.

## 2016-09-15 NOTE — ED Notes (Addendum)
Pt has a triple access dialysis cath noted to right groin with dressing in place and no s/s of infection noted at site. Blood is noted in all lines of catheter. Charge rn michelle in to look at site with this rn for verification. Pt would not allow this rn to visualize area until this time. Pt denies pain to insertion site.

## 2016-09-16 LAB — GLUCOSE, CAPILLARY
GLUCOSE-CAPILLARY: 341 mg/dL — AB (ref 65–99)
GLUCOSE-CAPILLARY: 244 mg/dL — AB (ref 65–99)
Glucose-Capillary: 164 mg/dL — ABNORMAL HIGH (ref 65–99)

## 2016-09-16 MED ORDER — CHOLECALCIFEROL 10 MCG (400 UNIT) PO TABS
ORAL_TABLET | ORAL | Status: AC
  Administered 2016-09-16: 800 [IU] via ORAL
  Filled 2016-09-16: qty 2

## 2016-09-16 NOTE — ED Notes (Signed)
BEHAVIORAL HEALTH ROUNDING Patient sleeping: Yes.   Patient alert and oriented: not applicable SLEEPING Behavior appropriate: Yes.  ; If no, describe: SLEEPING Nutrition and fluids offered: No SLEEPING Toileting and hygiene offered: NoSLEEPING Sitter present: not applicable, Q 15 min safety rounds and observation. Law enforcement present: Yes ODS 

## 2016-09-16 NOTE — ED Notes (Signed)
BEHAVIORAL HEALTH ROUNDING  Patient sleeping: No.  Patient alert and oriented: yes  Behavior appropriate: Yes. ; If no, describe:  Nutrition and fluids offered: Yes  Toileting and hygiene offered: Yes  Sitter present: not applicable, Q 15 min safety rounds and observation.  Law enforcement present: Yes ODS  

## 2016-09-16 NOTE — ED Provider Notes (Signed)
-----------------------------------------   7:08 AM on 09/16/2016 -----------------------------------------   Blood pressure (!) 122/56, pulse 82, temperature 98.1 F (36.7 C), temperature source Oral, resp. rate 18, height 5\' 2"  (1.575 m), weight 78.1 kg, SpO2 100 %.  The patient had no acute events since last update.  Calm and cooperative at this time.     Hinda Kehr, MD 09/16/16 [phone]

## 2016-09-16 NOTE — Progress Notes (Signed)
LCSW  Update: [facility] is currently reviewing Kelly [name] and all documentation from Kelly Oswaldo Milian has been received by [employer] and they will review today. According to Kelly Geoffry Paradise we are close to having the group home approved and possible discharge date for next week. ( No dates were promised)   [employer] LCSW [phone]

## 2016-09-17 ENCOUNTER — Encounter: Payer: Self-pay | Admitting: Vascular Surgery

## 2016-09-17 LAB — RENAL FUNCTION PANEL
ALBUMIN: 3.7 g/dL (ref 3.5–5.0)
Anion gap: 15 (ref 5–15)
BUN: 80 mg/dL — AB (ref 6–20)
CALCIUM: 9.6 mg/dL (ref 8.9–10.3)
CO2: 27 mmol/L (ref 22–32)
CREATININE: 9.24 mg/dL — AB (ref 0.44–1.00)
Chloride: 92 mmol/L — ABNORMAL LOW (ref 101–111)
GFR, EST AFRICAN AMERICAN: 6 mL/min — AB (ref >60)
GFR, EST NON AFRICAN AMERICAN: 5 mL/min — AB (ref >60)
Glucose, Bld: 217 mg/dL — ABNORMAL HIGH (ref 65–99)
PHOSPHORUS: 5.4 mg/dL — AB (ref 2.5–4.6)
Potassium: 4.6 mmol/L (ref 3.5–5.1)
SODIUM: 134 mmol/L — AB (ref 135–145)

## 2016-09-17 LAB — CBC
HCT: 29.8 % — ABNORMAL LOW (ref 35.0–47.0)
Hemoglobin: 10.1 g/dL — ABNORMAL LOW (ref 12.0–16.0)
MCH: 32.1 pg (ref 26.0–34.0)
MCHC: 33.9 g/dL (ref 32.0–36.0)
MCV: 94.5 fL (ref 80.0–100.0)
PLATELETS: 167 10*3/uL (ref 150–440)
RBC: 3.16 MIL/uL — AB (ref 3.80–5.20)
RDW: 14.1 % (ref 11.5–14.5)
WBC: 4.6 10*3/uL (ref 3.6–11.0)

## 2016-09-17 LAB — GLUCOSE, CAPILLARY
GLUCOSE-CAPILLARY: 180 mg/dL — AB (ref 65–99)
GLUCOSE-CAPILLARY: 193 mg/dL — AB (ref 65–99)
GLUCOSE-CAPILLARY: 331 mg/dL — AB (ref 65–99)
Glucose-Capillary: 149 mg/dL — ABNORMAL HIGH (ref 65–99)
Glucose-Capillary: 151 mg/dL — ABNORMAL HIGH (ref 65–99)

## 2016-09-17 NOTE — ED Notes (Signed)
Patient stated leg was hurting really bad. Notified RN Kandee Keen

## 2016-09-17 NOTE — ED Notes (Signed)
Patient refused shower but agreed to washing at bedside .

## 2016-09-17 NOTE — ED Notes (Signed)
Pt requesting groin dialysis catheter removed, spoke with Doroteo Bradford at [facility], states she will come down to remove it. Pt informed.

## 2016-09-17 NOTE — Progress Notes (Signed)
Post hd vitals 

## 2016-09-17 NOTE — Progress Notes (Signed)
Came down to the ED to remove the patient's temporary HD catheter.  Patient was placed in a lying position.  Catheter was removed with  purple tip intact.  Patient tolerated it well.  I held pressure for 2-3 minutes then Ronalee Belts, [employer] in ED continued to hold pressure and said he would hold for 20 minutes then have the RN check.  I reported this to [place], the primary RN.

## 2016-09-17 NOTE — ED Notes (Signed)
VOL/Pending placement 

## 2016-09-17 NOTE — ED Notes (Signed)
Pt. back from dialysis

## 2016-09-17 NOTE — Progress Notes (Signed)
Pre hd info 

## 2016-09-17 NOTE — ED Notes (Signed)
Pt to dialysis.

## 2016-09-17 NOTE — Progress Notes (Signed)
End of hd tx  

## 2016-09-17 NOTE — Progress Notes (Signed)
Subjective:   Seen and examined on hemodialysis. Tolerating treatment well. UF goal of 2.5kg.     HEMODIALYSIS FLOWSHEET:  Blood Flow Rate (mL/min): 400 mL/min Arterial Pressure (mmHg): -220 mmHg Venous Pressure (mmHg): 240 mmHg Transmembrane Pressure (mmHg): 40 mmHg Ultrafiltration Rate (mL/min): 1000 mL/min Dialysate Flow Rate (mL/min): 800 ml/min Conductivity: Machine : 14.7 Conductivity: Machine : 14.7 Dialysis Fluid Bolus: Normal Saline Bolus Amount (mL): 250 mL (prime) Dialysate Change:  (3k) Intra-Hemodialysis Comments: 795. Resting   Objective:  Vital signs in last 24 hours:  Temp:  [97.8 F (36.6 C)-98.9 F (37.2 C)] 98.3 F (36.8 C) (10/23 1008) Pulse Rate:  [90-103] 103 (10/23 1100) Resp:  [13-19] 19 (10/23 1100) BP: (98-128)/(53-90) 108/66 (10/23 1100) SpO2:  [99 %-100 %] 100 % (10/23 1100) Weight:  [80.2 kg (176 lb 12.9 oz)] 80.2 kg (176 lb 12.9 oz) (10/23 1008)  Weight change:  Filed Weights   09/15/16 1418 09/15/16 1758 09/17/16 1008  Weight: 80.2 kg (176 lb 12.9 oz) 78.1 kg (172 lb 2.9 oz) 80.2 kg (176 lb 12.9 oz)    Intake/Output:   No intake or output data in the 24 hours ending 09/17/16 1125   Physical Exam: General: No acute distress  HEENT [place]/AT hearing intact  Neck Supple   Pulm/lungs Normal breathing effort, clear to auscultation   CVS/Heart Regular rhythm, no rub or gallop, Systolic murmur present  Abdomen:  Soft, nontender, nondistended   Extremities: No peripheral edema   Neurologic: Awake, alert, following commands  Skin: No acute rashes   Access: Left upper arm AVG       Basic Metabolic Panel:   Recent Labs Lab 09/10/16 1137 09/11/16 1010 09/12/16 1015 09/14/16 1006 09/17/16 1015  NA 133* 135 136  --  134*  K 6.8* 5.7* 4.4 4.9 4.6  CL 92* 93* 93*  --  92*  CO2 26 28 28   --  27  GLUCOSE 208* 276* 359*  --  217*  BUN 99* 76* 46*  --  80*  CREATININE 10.90* 8.96* 7.12*  --  9.24*  CALCIUM 9.9 9.7 9.9  --  9.6  PHOS  4.4 5.5* 5.2* 4.9* 5.4*     CBC:  Recent Labs Lab 09/10/16 1137 09/12/16 1015 09/17/16 1015  WBC 4.7 4.6 4.6  HGB 10.9* 10.6* 10.1*  HCT 31.3* 31.2* 29.8*  MCV 94.1 94.9 94.5  PLT 181 170 167      Microbiology:  No results found for this or any previous visit (from the past 720 hour(s)).  Coagulation Studies: No results for input(s): LABPROT, INR in the last 72 hours.  Urinalysis: No results for input(s): COLORURINE, LABSPEC, PHURINE, GLUCOSEU, HGBUR, BILIRUBINUR, KETONESUR, PROTEINUR, UROBILINOGEN, NITRITE, LEUKOCYTESUR in the last 72 hours.  Invalid input(s): APPERANCEUR    Imaging: No results found.   Medications:   .  ceFAZolin (ANCEF) IV Stopped (09/13/16 1811)   . acetaminophen  650 mg Oral Once  . amLODipine  10 mg Oral QPM  . aspirin  81 mg Oral Daily  . cholecalciferol  800 Units Oral Daily  . cloNIDine  0.1 mg Oral BID  . divalproex  1,000 mg Oral QHS  . epoetin (EPOGEN/PROCRIT) injection  4,000 Units Intravenous Q M,W,F-HD  . escitalopram  15 mg Oral Daily  . feeding supplement (NEPRO CARB STEADY)  237 mL Oral BID BM  . hydrALAZINE  25 mg Oral TID  . insulin aspart  0-15 Units Subcutaneous TID WC  . insulin aspart  0-5 Units Subcutaneous  QHS  . irbesartan  300 mg Oral QHS  . metoprolol tartrate  50 mg Oral BID  . patiromer  8.4 g Oral Q24H  . QUEtiapine  200 mg Oral BID  . traZODone  100 mg Oral QHS   acetaminophen, heparin  Assessment/ Plan:  35 y.o. African-American female with developmental delay, ESRD, AOCD, SHPTH, HTN, schizophrenia, mild cognitive impairment, diabetes mellitus type II  [facility] MWF [address] [address]  1. Hyperkalemia:  Continued on patiromer on a daily basis - 2K bath with dialysis  2. Hypertension:  - Amlodipine, clonidine, hydralazine, irbesartan, metoprolol - recently decreased the dose of hydralazine  3. Anemia of chronic kidney disease  - continue epogen.   4. Secondary Hyperparathyroidism: phosphorus  5.4, calcium at goal.  - not currently on binders  5. End Stage Renal Disease: on hemodialysis MWF schedule.  - have catheter removed today  6. DM-2 with CKD Insulin dependent    LOS: 0 Kelly [name], [place] 10/23/201711:25 AM

## 2016-09-17 NOTE — Progress Notes (Signed)
Post hd assessment 

## 2016-09-17 NOTE — Progress Notes (Signed)
Pre hd assessment  

## 2016-09-17 NOTE — ED Notes (Signed)
BS 151

## 2016-09-18 LAB — GLUCOSE, CAPILLARY
GLUCOSE-CAPILLARY: 378 mg/dL — AB (ref 65–99)
GLUCOSE-CAPILLARY: 93 mg/dL (ref 65–99)
GLUCOSE-CAPILLARY: 137 mg/dL — AB (ref 65–99)
Glucose-Capillary: 250 mg/dL — ABNORMAL HIGH (ref 65–99)

## 2016-09-18 NOTE — ED Notes (Signed)
PT  VOL/  PENDING  PLACEMENT 

## 2016-09-18 NOTE — ED Notes (Signed)
BEHAVIORAL HEALTH ROUNDING Patient sleeping: Yes.   Patient alert and oriented: eyes closed  Appears to be asleep Behavior appropriate: Yes.  ; If no, describe:  Nutrition and fluids offered:  sleeping Toileting and hygiene offered: sleeping Sitter present: q 15 minute observations and security  monitoring Law enforcement present: yes  ODS  ENVIRONMENTAL ASSESSMENT Potentially harmful objects out of patient reach: Yes.   Personal belongings secured: Yes.   Patient dressed in hospital provided attire only: Yes.   Plastic bags out of patient reach: Yes.   Patient care equipment (cords, cables, call bells, lines, and drains) shortened, removed, or accounted for: Yes.   Equipment and supplies removed from bottom of stretcher: Yes.   Potentially toxic materials out of patient reach: Yes.   Sharps container removed or out of patient reach: Yes.

## 2016-09-18 NOTE — ED Notes (Signed)
Spoon given to pt to eat jello

## 2016-09-18 NOTE — ED Notes (Signed)
BEHAVIORAL HEALTH ROUNDING Patient sleeping: Yes.   Patient alert and oriented: eyes closed  Appears to be asleep Behavior appropriate: Yes.  ; If no, describe:  Nutrition and fluids offered: sleeping Toileting and hygiene offered: sleeping Sitter present: q 15 minute observations and security monitoring Law enforcement present: yes  ODS 

## 2016-09-18 NOTE — ED Notes (Signed)
ED BHU [place] Is the patient under IVC or is there intent for IVC:  no Is the patient medically cleared: Yes.   Is there vacancy in the ED BHU: Yes.   Is the population mix appropriate for patient: Yes.   Is the patient awaiting placement in inpatient or outpatient setting: Yes.   Group home placement with HD access Has the patient had a psychiatric consult: Yes.   Survey of unit performed for contraband, proper placement and condition of furniture, tampering with fixtures in bathroom, shower, and each patient room: Yes.  ; Findings:  APPEARANCE/BEHAVIOR Calm and cooperative NEURO ASSESSMENT Orientation: oriented x4  Denies pain Hallucinations: No.None noted (Hallucinations) Speech: Normal Gait: normal RESPIRATORY ASSESSMENT Even  Unlabored respirations  CARDIOVASCULAR ASSESSMENT Pulses equal   regular rate  Skin warm and dry   GASTROINTESTINAL ASSESSMENT no GI complaint EXTREMITIES Full ROM  PLAN OF CARE Provide calm/safe environment. Vital signs assessed twice daily. ED BHU Assessment once each 12-hour shift. Collaborate with TTS daily or as condition indicates. Assure the ED provider has rounded once each shift. Provide and encourage hygiene. Provide redirection as needed. Assess for escalating behavior; address immediately and inform ED provider.  Assess family dynamic and appropriateness for visitation as needed: Yes.  ; If necessary, describe findings:  Educate the patient/family about BHU procedures/visitation: Yes.  ; If necessary, describe findings:

## 2016-09-18 NOTE — ED Notes (Signed)
Pt given supper tray. Pt stated she ordered chicken and got tuna and will not eat it. Pt was given grilled chicken sandwich and stated she does not want it. She wants what she had yesterday which was noodles and cheese. Pt was informed that she would have to eat the tuna or the chicken. Nothing else would be ordered.

## 2016-09-18 NOTE — ED Notes (Signed)
BEHAVIORAL HEALTH ROUNDING Patient sleeping: No. Patient alert and oriented: yes Behavior appropriate: Yes.  ; If no, describe:  Nutrition and fluids offered: yes Toileting and hygiene offered: Yes  Sitter present: q15 minute observations and security  monitoring Law enforcement present: Yes  ODS  

## 2016-09-18 NOTE — ED Notes (Addendum)
I have called [phone] concerning pts meal tray - pt did not receive breakfast or lunch from kitchen  Pt has also not received nepro today  Kitchen staff  stated that this pts meal is discontinued every day when she goes to HD and it is the nurses responsibility to replace the order  I have not noticed in her extended stay with [place] that we have had to do this  - I have reordered her renal diet and requested it for now

## 2016-09-18 NOTE — ED Provider Notes (Signed)
-----------------------------------------   6:45 AM on 09/18/2016 -----------------------------------------   Blood pressure (!) 142/93, pulse 90, temperature 98.4 F (36.9 C), temperature source Oral, resp. rate 19, height 5\' 2"  (1.575 m), weight 171 lb 4.8 oz (77.7 kg), SpO2 99 %.  The patient had no acute events since last update.  Calm and cooperative at this time.  Disposition is pending CSW team recommendations.     Paulette Blanch, MD 09/18/16 [phone]

## 2016-09-18 NOTE — ED Notes (Signed)
Pt spilled jello on scrubs and floor. PT was given new scrubs and told to change her scrubs and remove trash from room. HSK will be called to clean floor, but not remove her trash.

## 2016-09-18 NOTE — ED Notes (Signed)
Pt eating a dinner tray that AMY RN ordred earlier.

## 2016-09-18 NOTE — ED Notes (Signed)
Patient observed lying in bed with eyes closed  Even, unlabored respirations observed   NAD pt appears to be sleeping  I will continue to monitor along with every 15 minute visual observations and ongoing security monitoring    

## 2016-09-18 NOTE — ED Notes (Signed)
Pt told to go into her room. Pt told tech to shut up. Pt keeps coming into hallway and pt was informed that she is still a pt here and the other patients cannot come into the hallway, therefore she can't. If she continues I'm shutting the door. Pt tells tech she can leave if she wants to and tech informs pt to either leave or go in her room. Pt returns to room. Pt continues to stand in doorway and security continues to allow it.

## 2016-09-19 LAB — RENAL FUNCTION PANEL
ALBUMIN: 3.6 g/dL (ref 3.5–5.0)
ANION GAP: 16 — AB (ref 5–15)
BUN: 84 mg/dL — ABNORMAL HIGH (ref 6–20)
CALCIUM: 9.8 mg/dL (ref 8.9–10.3)
CO2: 26 mmol/L (ref 22–32)
Chloride: 93 mmol/L — ABNORMAL LOW (ref 101–111)
Creatinine, Ser: 9.47 mg/dL — ABNORMAL HIGH (ref 0.44–1.00)
GFR calc Af Amer: 6 mL/min — ABNORMAL LOW (ref >60)
GFR, EST NON AFRICAN AMERICAN: 5 mL/min — AB (ref >60)
GLUCOSE: 153 mg/dL — AB (ref 65–99)
PHOSPHORUS: 4.7 mg/dL — AB (ref 2.5–4.6)
Potassium: 4.5 mmol/L (ref 3.5–5.1)
SODIUM: 135 mmol/L (ref 135–145)

## 2016-09-19 LAB — CBC
HCT: 30.8 % — ABNORMAL LOW (ref 35.0–47.0)
HEMOGLOBIN: 10.4 g/dL — AB (ref 12.0–16.0)
MCH: 32 pg (ref 26.0–34.0)
MCHC: 33.9 g/dL (ref 32.0–36.0)
MCV: 94.2 fL (ref 80.0–100.0)
Platelets: 172 10*3/uL (ref 150–440)
RBC: 3.27 MIL/uL — ABNORMAL LOW (ref 3.80–5.20)
RDW: 14.4 % (ref 11.5–14.5)
WBC: 6.1 10*3/uL (ref 3.6–11.0)

## 2016-09-19 LAB — GLUCOSE, CAPILLARY
GLUCOSE-CAPILLARY: 290 mg/dL — AB (ref 65–99)
GLUCOSE-CAPILLARY: 305 mg/dL — AB (ref 65–99)
Glucose-Capillary: 213 mg/dL — ABNORMAL HIGH (ref 65–99)
Glucose-Capillary: 127 mg/dL — ABNORMAL HIGH (ref 65–99)

## 2016-09-19 NOTE — Progress Notes (Signed)
Post hd vitals 

## 2016-09-19 NOTE — Progress Notes (Signed)
Hd start 

## 2016-09-19 NOTE — Progress Notes (Signed)
CSW received a call from pt's care coordinator Scarlette Shorts [phone]). MeKeisha states that some challenges have come up in getting the single case agreement approved. Tsosie Billing reports that would be helpful if pt had recent IQ scores that she could present to her team to support a IDD diagnosis.   CSW contacted psychologist and asked about IQ testing for pt. Pt will complete the testing in the morning.   CSW will continue to follow pt and assist as needed.  [name], MSW, [place]

## 2016-09-19 NOTE — ED Notes (Signed)
Lunch tray sat at bedside. Pt in dialysis.

## 2016-09-19 NOTE — Progress Notes (Signed)
Pre hd assessment  

## 2016-09-19 NOTE — Progress Notes (Signed)
Pre hd info 

## 2016-09-19 NOTE — Progress Notes (Signed)
Subjective:   Seen and examined on hemodialysis. Tolerating treatment well. UF goal of 2.5kg.     HEMODIALYSIS FLOWSHEET:  Blood Flow Rate (mL/min): 400 mL/min Arterial Pressure (mmHg): -160 mmHg Venous Pressure (mmHg): 190 mmHg Transmembrane Pressure (mmHg): 40 mmHg Ultrafiltration Rate (mL/min): 830 mL/min Dialysate Flow Rate (mL/min): 600 ml/min Conductivity: Machine : 13.9 Conductivity: Machine : 13.9 Dialysis Fluid Bolus: Normal Saline Bolus Amount (mL): 250 mL (prime) Dialysate Change: 2K Intra-Hemodialysis Comments: hd tx start, pt alert, no c/o, vss, ccmd notified   Objective:  Vital signs in last 24 hours:  Temp:  [98 F (36.7 C)-98.2 F (36.8 C)] 98.2 F (36.8 C) (10/25 0945) Pulse Rate:  [85-112] 106 (10/25 0950) Resp:  [18-21] 19 (10/25 0950) BP: (99-129)/(54-82) 108/71 (10/25 0950) SpO2:  [97 %-99 %] 99 % (10/25 0950) Weight:  [80.7 kg (177 lb 14.6 oz)] 80.7 kg (177 lb 14.6 oz) (10/25 0945)  Weight change:  Filed Weights   09/17/16 1008 09/17/16 1315 09/19/16 0945  Weight: 80.2 kg (176 lb 12.9 oz) 77.7 kg (171 lb 4.8 oz) 80.7 kg (177 lb 14.6 oz)    Intake/Output:    Intake/Output Summary (Last 24 hours) at 09/19/16 1021 Last data filed at 09/18/16 2100  Gross per 24 hour  Intake              360 ml  Output                0 ml  Net              360 ml     Physical Exam: General: No acute distress  HEENT [place]/AT hearing intact  Neck Supple   Pulm/lungs Normal breathing effort, clear to auscultation   CVS/Heart Regular rhythm, no rub or gallop, Systolic murmur present  Abdomen:  Soft, nontender, nondistended   Extremities: No peripheral edema   Neurologic: Awake, alert, following commands  Skin: No acute rashes   Access: Left upper arm AVG       Basic Metabolic Panel:   Recent Labs Lab 09/14/16 1006 09/17/16 1015  NA  --  134*  K 4.9 4.6  CL  --  92*  CO2  --  27  GLUCOSE  --  217*  BUN  --  80*  CREATININE  --  9.24*  CALCIUM   --  9.6  PHOS 4.9* 5.4*     CBC:  Recent Labs Lab 09/17/16 1015  WBC 4.6  HGB 10.1*  HCT 29.8*  MCV 94.5  PLT 167      Microbiology:  No results found for this or any previous visit (from the past 720 hour(s)).  Coagulation Studies: No results for input(s): LABPROT, INR in the last 72 hours.  Urinalysis: No results for input(s): COLORURINE, LABSPEC, PHURINE, GLUCOSEU, HGBUR, BILIRUBINUR, KETONESUR, PROTEINUR, UROBILINOGEN, NITRITE, LEUKOCYTESUR in the last 72 hours.  Invalid input(s): APPERANCEUR    Imaging: No results found.   Medications:   .  ceFAZolin (ANCEF) IV Stopped (09/13/16 1811)   . acetaminophen  650 mg Oral Once  . amLODipine  10 mg Oral QPM  . aspirin  81 mg Oral Daily  . cholecalciferol  800 Units Oral Daily  . cloNIDine  0.1 mg Oral BID  . divalproex  1,000 mg Oral QHS  . epoetin (EPOGEN/PROCRIT) injection  4,000 Units Intravenous Q M,W,F-HD  . escitalopram  15 mg Oral Daily  . feeding supplement (NEPRO CARB STEADY)  237 mL Oral BID BM  . hydrALAZINE  25 mg Oral TID  . insulin aspart  0-15 Units Subcutaneous TID WC  . insulin aspart  0-5 Units Subcutaneous QHS  . irbesartan  300 mg Oral QHS  . metoprolol tartrate  50 mg Oral BID  . patiromer  8.4 g Oral Q24H  . QUEtiapine  200 mg Oral BID  . traZODone  100 mg Oral QHS   acetaminophen, heparin  Assessment/ Plan:  35 y.o. African-American female with developmental delay, ESRD, AOCD, SHPTH, HTN, schizophrenia, mild cognitive impairment, diabetes mellitus type II  [facility] MWF [address] [address]  1. Hyperkalemia:  Continued on patiromer on a daily basis - 2K bath with dialysis  2. Hypertension:  - Amlodipine, clonidine, hydralazine, irbesartan, metoprolol - recently decreased the dose of hydralazine  3. Anemia of chronic kidney disease  - continue epogen.   4. Secondary Hyperparathyroidism: phosphorus 5.4, calcium at goal.  - not currently on binders  5. End Stage Renal Disease:  on hemodialysis MWF schedule.  - have catheter removed today  6. DM-2 with CKD Insulin dependent    LOS: 0 Kelly [name], [place] 10/25/201710:21 AM

## 2016-09-19 NOTE — ED Notes (Signed)
Transporting pt to dialysis.

## 2016-09-19 NOTE — ED Notes (Signed)
Pt brought back to room at this time.  Kelly [name] reported to me that [name] was unable to complete the IQ testing due to her back was hurting, so Kelly [name] will be back in the morning to complete the testing.

## 2016-09-19 NOTE — ED Notes (Signed)
Pt given breakfast tray. Pt informed trash needed to be thrown away when finished and she would be going to dialysis shortly.

## 2016-09-19 NOTE — Progress Notes (Signed)
Post hd assessment 

## 2016-09-19 NOTE — ED Notes (Signed)
VOL/Pending Placement 

## 2016-09-19 NOTE — ED Notes (Signed)
Pt asleep at this time. Will offer snack when she wakes up.

## 2016-09-19 NOTE — ED Provider Notes (Signed)
-----------------------------------------   6:18 AM on 09/19/2016 -----------------------------------------   Blood pressure (!) 99/54, pulse 95, temperature 98.2 F (36.8 C), temperature source Oral, resp. rate 18, height 5\' 2"  (1.575 m), weight 171 lb 4.8 oz (77.7 kg), SpO2 97 %.  The patient had no acute events since last update.  Calm and cooperative at this time.  Disposition is pending CSW team recommendations. For dialysis today.     Paulette Blanch, MD 09/19/16 [phone]

## 2016-09-19 NOTE — ED Notes (Signed)
Pt. Noted in room watching tv now. Few minutes ago walking in the hallway requesting something to drink and warm blankets.  No complaints. No distress or abnormal behavior noted. Will continue to monitor. Q 15 minute rounds continue.

## 2016-09-19 NOTE — ED Notes (Addendum)
Pt brought back from dialysis by Pam, tech.  Pt was then taken to do IQ testing with Laddie Aquas.

## 2016-09-19 NOTE — ED Notes (Signed)
Pt refusing to eat her lunch due to it not being what she wants. Pt would not fill out menu for tomorrow and was informed that she would get whatever they send if she doesn't select something. Pt refused.

## 2016-09-19 NOTE — ED Notes (Signed)
Patient states "I don't want this nasty food, it isn't what I want".  Patient noted to be eating meal tray after this is stated.  Informed patient that she has had the opportunity to pick her meals for the next day and has declined each time.

## 2016-09-19 NOTE — Progress Notes (Signed)
  End of hd 

## 2016-09-19 NOTE — ED Notes (Signed)
Meal tray given to patient.

## 2016-09-20 LAB — GLUCOSE, CAPILLARY
GLUCOSE-CAPILLARY: 296 mg/dL — AB (ref 65–99)
GLUCOSE-CAPILLARY: 83 mg/dL (ref 65–99)
Glucose-Capillary: 202 mg/dL — ABNORMAL HIGH (ref 65–99)

## 2016-09-20 NOTE — ED Notes (Signed)
Patient observed lying in bed with eyes closed  Even, unlabored respirations observed   NAD pt appears to be sleeping  I will continue to monitor along with every 15 minute visual observations and ongoing security monitoring    

## 2016-09-20 NOTE — ED Provider Notes (Signed)
Vitals:   09/20/16 0634 09/20/16 0636  BP: (!) 141/71 (!) 141/71  Pulse:  97  Resp:  18  Temp:  98.8 F (37.1 C)   Patient remains medically stable for psychiatric disposition and placement   Earleen Newport, MD 09/20/16 [phone]

## 2016-09-20 NOTE — Progress Notes (Signed)
CSW submitted results of IQ testing to [facility].   [name], MSW, [place]

## 2016-09-20 NOTE — ED Notes (Signed)
Nurse AmyT. Talked with patient ,patient allowed me to check blood sugar , blood sugar was 296

## 2016-09-20 NOTE — ED Notes (Signed)
BEHAVIORAL HEALTH ROUNDING Patient sleeping: No. Patient alert and oriented: yes Behavior appropriate: Yes.  ; If no, describe:  Nutrition and fluids offered: yes Toileting and hygiene offered: Yes  Sitter present: q15 minute observations and security  monitoring Law enforcement present: Yes  ODS  

## 2016-09-20 NOTE — ED Notes (Signed)
BEHAVIORAL HEALTH ROUNDING Patient sleeping: Yes.   Patient alert and oriented: eyes closed  Appears to be asleep Behavior appropriate: Yes.  ; If no, describe:  Nutrition and fluids offered: Yes  Toileting and hygiene offered: sleeping Sitter present: q 15 minute observations and security monitoring Law enforcement present: yes  ODS 

## 2016-09-20 NOTE — ED Notes (Signed)
Patient refused shower and blood sugar check notified nurse Amy T.  Will try again.

## 2016-09-20 NOTE — ED Notes (Signed)
ED BHU [place] Is the patient under IVC or is there intent for IVC:  No voluntary  Is the patient medically cleared: Yes.   Is there vacancy in the ED BHU: Yes.   Is the population mix appropriate for patient: Yes.   Is the patient awaiting placement in inpatient or outpatient setting: Yes.   Group home placement with access to hemodialysis Has the patient had a psychiatric consult: Yes.   Survey of unit performed for contraband, proper placement and condition of furniture, tampering with fixtures in bathroom, shower, and each patient room: Yes.  ; Findings:  APPEARANCE/BEHAVIOR Calm and cooperative NEURO ASSESSMENT Orientation: oriented x 4 Denies pain Hallucinations: No.None noted (Hallucinations) Speech: Normal Gait: normal RESPIRATORY ASSESSMENT Even  Unlabored respirations  CARDIOVASCULAR ASSESSMENT Pulses equal   regular rate  Skin warm and dry   GASTROINTESTINAL ASSESSMENT no GI complaint EXTREMITIES Full ROM  PLAN OF CARE Provide calm/safe environment. Vital signs assessed twice daily. ED BHU Assessment once each 12-hour shift. Collaborate with TTS daily or as condition indicates. Assure the ED provider has rounded once each shift. Provide and encourage hygiene. Provide redirection as needed. Assess for escalating behavior; address immediately and inform ED provider.  Assess family dynamic and appropriateness for visitation as needed: Yes.  ; If necessary, describe findings:  Educate the patient/family about BHU procedures/visitation: Yes.  ; If necessary, describe findings:

## 2016-09-20 NOTE — ED Notes (Signed)
Patient woke up and took her meal tray without any issues.

## 2016-09-20 NOTE — ED Notes (Signed)
BEHAVIORAL HEALTH ROUNDING Patient sleeping: Yes.   Patient alert and oriented: eyes closed  Appears asleep Behavior appropriate: Yes.  ; If no, describe:  Nutrition and fluids offered: sleeping Toileting and hygiene offered: sleeping Sitter present: q 15 minute observations and security monitoring Law enforcement present: yes  ODS
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
## 2016-09-20 NOTE — ED Notes (Signed)
Patient refused to have blood sugar checked notified nurse Amy T.

## 2016-09-20 NOTE — ED Notes (Signed)
New meal ordered  Pt refuses to eat or take meds or have her sugar checked until she gets what she wants to eat

## 2016-09-20 NOTE — ED Notes (Addendum)
BEHAVIORAL HEALTH ROUNDING Patient sleeping: Yes.   Patient alert and oriented: eyes closed  Appears to be asleep Behavior appropriate: Yes.  ; If no, describe:  Nutrition and fluids offered:  sleeping Toileting and hygiene offered: sleeping Sitter present: q 15 minute observations and security monitoring Law enforcement present: yes  ODS  ENVIRONMENTAL ASSESSMENT Potentially harmful objects out of patient reach: Yes.   Personal belongings secured: Yes.   Patient dressed in hospital provided attire only: Yes.   Plastic bags out of patient reach: Yes.   Patient care equipment (cords, cables, call bells, lines, and drains) shortened, removed, or accounted for: Yes.   Equipment and supplies removed from bottom of stretcher: Yes.   Potentially toxic materials out of patient reach: Yes.   Sharps container removed or out of patient reach: Yes.

## 2016-09-20 NOTE — ED Notes (Signed)
Patient in shower 

## 2016-09-21 LAB — RENAL FUNCTION PANEL
Albumin: 3.4 g/dL — ABNORMAL LOW (ref 3.5–5.0)
Anion gap: 17 — ABNORMAL HIGH (ref 5–15)
BUN: 95 mg/dL — ABNORMAL HIGH (ref 6–20)
CHLORIDE: 91 mmol/L — AB (ref 101–111)
CO2: 26 mmol/L (ref 22–32)
CREATININE: 8.68 mg/dL — AB (ref 0.44–1.00)
Calcium: 9.7 mg/dL (ref 8.9–10.3)
GFR calc non Af Amer: 5 mL/min — ABNORMAL LOW (ref >60)
GFR, EST AFRICAN AMERICAN: 6 mL/min — AB (ref >60)
GLUCOSE: 213 mg/dL — AB (ref 65–99)
Phosphorus: 4.7 mg/dL — ABNORMAL HIGH (ref 2.5–4.6)
Potassium: 5.3 mmol/L — ABNORMAL HIGH (ref 3.5–5.1)
SODIUM: 134 mmol/L — AB (ref 135–145)

## 2016-09-21 LAB — CBC
HCT: 28.8 % — ABNORMAL LOW (ref 35.0–47.0)
Hemoglobin: 9.7 g/dL — ABNORMAL LOW (ref 12.0–16.0)
MCH: 31.9 pg (ref 26.0–34.0)
MCHC: 33.7 g/dL (ref 32.0–36.0)
MCV: 94.4 fL (ref 80.0–100.0)
PLATELETS: 169 10*3/uL (ref 150–440)
RBC: 3.05 MIL/uL — AB (ref 3.80–5.20)
RDW: 14.3 % (ref 11.5–14.5)
WBC: 4.8 10*3/uL (ref 3.6–11.0)

## 2016-09-21 LAB — GLUCOSE, CAPILLARY
GLUCOSE-CAPILLARY: 190 mg/dL — AB (ref 65–99)
GLUCOSE-CAPILLARY: 225 mg/dL — AB (ref 65–99)
GLUCOSE-CAPILLARY: 250 mg/dL — AB (ref 65–99)
Glucose-Capillary: 108 mg/dL — ABNORMAL HIGH (ref 65–99)

## 2016-09-21 NOTE — ED Notes (Signed)

## 2016-09-21 NOTE — ED Notes (Signed)
BEHAVIORAL HEALTH ROUNDING Patient sleeping: No. Patient alert and oriented: yes Behavior appropriate: Yes.  ; If no, describe:  Nutrition and fluids offered: yes Toileting and hygiene offered: Yes  Sitter present: q15 minute observations and security  monitoring Law enforcement present: Yes  ODS  

## 2016-09-21 NOTE — Progress Notes (Signed)
Post dialysis 

## 2016-09-21 NOTE — Progress Notes (Signed)
LCSW spoke with [name] and she reported she is attempting to resolve issues with [facility] as they continue to negotiate for increased 1-1 hours of care.  LCSW will hear back from [name] later this afternoon and will inform this social worker her status with [facility].   [employer] LCSW [phone]

## 2016-09-21 NOTE — Progress Notes (Signed)
Subjective:   Seen and examined on hemodialysis. Tolerating treatment well. UF goal of 2kg.     HEMODIALYSIS FLOWSHEET:  Blood Flow Rate (mL/min): 400 mL/min Arterial Pressure (mmHg): -200 mmHg Venous Pressure (mmHg): 230 mmHg Transmembrane Pressure (mmHg): 50 mmHg Ultrafiltration Rate (mL/min): 940 mL/min Dialysate Flow Rate (mL/min): 800 ml/min Conductivity: Machine : 14 Conductivity: Machine : 14 Dialysis Fluid Bolus: Normal Saline Bolus Amount (mL): 250 mL Dialysate Change: 2K Intra-Hemodialysis Comments: 2000. Tx complete   Objective:  Vital signs in last 24 hours:  Temp:  [98.3 F (36.8 C)-98.6 F (37 C)] 98.4 F (36.9 C) (10/27 1258) Pulse Rate:  [87-101] 93 (10/27 1258) Resp:  [14-21] 18 (10/27 1258) BP: (92-148)/(56-87) 111/74 (10/27 1258) SpO2:  [95 %-100 %] 100 % (10/27 1258) Weight:  [82.4 kg (181 lb 10.5 oz)] 82.4 kg (181 lb 10.5 oz) (10/27 0945)  Weight change:  Filed Weights   09/19/16 0945 09/19/16 1250 09/21/16 0945  Weight: 80.7 kg (177 lb 14.6 oz) 79.2 kg (174 lb 9.7 oz) 82.4 kg (181 lb 10.5 oz)    Intake/Output:    Intake/Output Summary (Last 24 hours) at 09/21/16 1310 Last data filed at 09/21/16 1258  Gross per 24 hour  Intake                0 ml  Output             1500 ml  Net            -1500 ml     Physical Exam: General: No acute distress  HEENT [place]/AT hearing intact  Neck Supple   Pulm/lungs Normal breathing effort, clear to auscultation   CVS/Heart Regular rhythm, no rub or gallop, Systolic murmur present  Abdomen:  Soft, nontender, nondistended   Extremities: No peripheral edema   Neurologic: Awake, alert, following commands  Skin: No acute rashes   Access: Left upper arm AVG       Basic Metabolic Panel:   Recent Labs Lab 09/17/16 1015 09/19/16 0950 09/21/16 0950  NA 134* 135 134*  K 4.6 4.5 5.3*  CL 92* 93* 91*  CO2 27 26 26   GLUCOSE 217* 153* 213*  BUN 80* 84* 95*  CREATININE 9.24* 9.47* 8.68*  CALCIUM 9.6  9.8 9.7  PHOS 5.4* 4.7* 4.7*     CBC:  Recent Labs Lab 09/17/16 1015 09/19/16 0950 09/21/16 0950  WBC 4.6 6.1 4.8  HGB 10.1* 10.4* 9.7*  HCT 29.8* 30.8* 28.8*  MCV 94.5 94.2 94.4  PLT 167 172 169      Microbiology:  No results found for this or any previous visit (from the past 720 hour(s)).  Coagulation Studies: No results for input(s): LABPROT, INR in the last 72 hours.  Urinalysis: No results for input(s): COLORURINE, LABSPEC, PHURINE, GLUCOSEU, HGBUR, BILIRUBINUR, KETONESUR, PROTEINUR, UROBILINOGEN, NITRITE, LEUKOCYTESUR in the last 72 hours.  Invalid input(s): APPERANCEUR    Imaging: No results found.   Medications:   .  ceFAZolin (ANCEF) IV Stopped (09/13/16 1811)   . acetaminophen  650 mg Oral Once  . amLODipine  10 mg Oral QPM  . aspirin  81 mg Oral Daily  . cholecalciferol  800 Units Oral Daily  . cloNIDine  0.1 mg Oral BID  . divalproex  1,000 mg Oral QHS  . epoetin (EPOGEN/PROCRIT) injection  4,000 Units Intravenous Q M,W,F-HD  . escitalopram  15 mg Oral Daily  . feeding supplement (NEPRO CARB STEADY)  237 mL Oral BID BM  . hydrALAZINE  25 mg Oral TID  . insulin aspart  0-15 Units Subcutaneous TID WC  . insulin aspart  0-5 Units Subcutaneous QHS  . irbesartan  300 mg Oral QHS  . metoprolol tartrate  50 mg Oral BID  . patiromer  8.4 g Oral Q24H  . QUEtiapine  200 mg Oral BID  . traZODone  100 mg Oral QHS   acetaminophen, heparin  Assessment/ Plan:  35 y.o. African-American female with developmental delay, ESRD, AOCD, SHPTH, HTN, schizophrenia, mild cognitive impairment, diabetes mellitus type II  [facility] MWF [address]  1. Hyperkalemia:  Continued on patiromer on a daily basis - 2K bath with dialysis  2. Hypertension:  - Amlodipine, clonidine, hydralazine, irbesartan, metoprolol - recently decreased the dose of hydralazine  3. Anemia of chronic kidney disease  - continue epogen.   4. Secondary Hyperparathyroidism:  phosphorus 5.4, calcium at goal.  - not currently on binders  5. End Stage Renal Disease: on hemodialysis MWF schedule.  - have catheter removed today  6. DM-2 with CKD Insulin dependent    LOS: 0 Izaan Kingbird 10/27/20171:10 PM

## 2016-09-21 NOTE — ED Notes (Signed)
Patient in room washing up at bedside

## 2016-09-21 NOTE — ED Notes (Signed)
Pt returned from HD.

## 2016-09-21 NOTE — Progress Notes (Signed)
Pre Dialysis 

## 2016-09-21 NOTE — ED Notes (Signed)

## 2016-09-21 NOTE — ED Notes (Signed)
Pt to HD via [place] and tech

## 2016-09-21 NOTE — Progress Notes (Signed)
Dialysis complete

## 2016-09-21 NOTE — ED Notes (Signed)
ED BHU [place] Is the patient under IVC or is there intent for IVC: Voluntary Is the patient medically cleared: Yes.   Is there vacancy in the ED BHU: Yes.   Is the population mix appropriate for patient: Yes.   Is the patient awaiting placement in inpatient or outpatient setting: Yes.  Group home placement with HD access Has the patient had a psychiatric consult: Yes.   Survey of unit performed for contraband, proper placement and condition of furniture, tampering with fixtures in bathroom, shower, and each patient room: Yes.  ; Findings:  APPEARANCE/BEHAVIOR Calm and cooperative NEURO ASSESSMENT Orientation: oriented x 4   Denies pain Hallucinations: No.None noted (Hallucinations) Speech: Normal Gait: normal RESPIRATORY ASSESSMENT Even  Unlabored respirations  CARDIOVASCULAR ASSESSMENT Pulses equal   regular rate  Skin warm and dry   GASTROINTESTINAL ASSESSMENT no GI complaint EXTREMITIES Full ROM  PLAN OF CARE Provide calm/safe environment. Vital signs assessed twice daily. ED BHU Assessment once each 12-hour shift. Collaborate with TTS daily or as condition indicates. Assure the ED provider has rounded once each shift. Provide and encourage hygiene. Provide redirection as needed. Assess for escalating behavior; address immediately and inform ED provider.  Assess family dynamic and appropriateness for visitation as needed: Yes.  ; If necessary, describe findings:  Educate the patient/family about BHU procedures/visitation: Yes.  ; If necessary, describe findings:

## 2016-09-21 NOTE — ED Notes (Signed)
Breakfast was given to patient. 

## 2016-09-21 NOTE — ED Provider Notes (Signed)
-----------------------------------------   6:38 AM on 09/21/2016 -----------------------------------------   BP 111/87   Pulse 91   Temp 98.6 F (37 C)   Resp 18   Ht 5\' 2"  (1.575 m)   Wt 174 lb 9.7 oz (79.2 kg)   SpO2 97%   BMI 31.94 kg/m   Acting appropriately.  Disposition is pending per Psychiatry/Behavioral Medicine team recommendations.     Nance Pear, MD 09/21/16 [phone]

## 2016-09-21 NOTE — Progress Notes (Signed)
Dialysis started 

## 2016-09-21 NOTE — ED Notes (Signed)
Patient observed lying in bed with eyes closed  Even, unlabored respirations observed   NAD pt appears to be sleeping  I will continue to monitor along with every 15 minute visual observations and ongoing security monitoring    

## 2016-09-22 LAB — GLUCOSE, CAPILLARY
GLUCOSE-CAPILLARY: 216 mg/dL — AB (ref 65–99)
GLUCOSE-CAPILLARY: 182 mg/dL — AB (ref 65–99)
GLUCOSE-CAPILLARY: 167 mg/dL — AB (ref 65–99)
Glucose-Capillary: 204 mg/dL — ABNORMAL HIGH (ref 65–99)

## 2016-09-22 NOTE — ED Notes (Signed)
VOL/Pending Placement 

## 2016-09-22 NOTE — ED Provider Notes (Signed)
-----------------------------------------   7:31 AM on 09/22/2016 -----------------------------------------   Blood pressure 111/67, pulse 81, temperature 98.8 F (37.1 C), temperature source Oral, resp. rate 18, height 5\' 2"  (1.575 m), weight 177 lb 4 oz (80.4 kg), SpO2 97 %.  The patient had no acute events since last update.  Calm and cooperative at this time.  Disposition is pending Psychiatry/Behavioral Medicine team recommendations.     Nena Polio, MD 09/22/16 [phone]

## 2016-09-22 NOTE — ED Notes (Signed)
Resumed care from [place], [place]. Pt sleeping. Will continue to monitor.

## 2016-09-22 NOTE — ED Notes (Signed)
Patient given warm blankets and tucked in bed

## 2016-09-22 NOTE — ED Notes (Signed)
BS 204

## 2016-09-23 LAB — GLUCOSE, CAPILLARY
Glucose-Capillary: 250 mg/dL — ABNORMAL HIGH (ref 65–99)
Glucose-Capillary: 73 mg/dL (ref 65–99)
Glucose-Capillary: 202 mg/dL — ABNORMAL HIGH (ref 65–99)
Glucose-Capillary: 87 mg/dL (ref 65–99)

## 2016-09-23 MED ORDER — HYDRALAZINE HCL 50 MG PO TABS
25.0000 mg | ORAL_TABLET | Freq: Three times a day (TID) | ORAL | Status: DC
  Administered 2016-09-23: 22:00:00 via ORAL
  Administered 2016-09-23 – 2016-10-02 (×11): 25 mg via ORAL
  Filled 2016-09-23 (×19): qty 1

## 2016-09-23 NOTE — ED Notes (Signed)
BEHAVIORAL HEALTH ROUNDING Patient sleeping: Yes.   Patient alert and oriented: not applicable SLEEPING Behavior appropriate: Yes.  ; If no, describe: SLEEPING Nutrition and fluids offered: No SLEEPING Toileting and hygiene offered: NoSLEEPING Sitter present: not applicable, Q 15 min safety rounds and observation. Law enforcement present: Yes ODS 

## 2016-09-23 NOTE — ED Notes (Signed)

## 2016-09-23 NOTE — ED Notes (Signed)
Pt with nurse tech, showered, floor swept and mopped, fresh linen changed

## 2016-09-23 NOTE — ED Provider Notes (Signed)
-----------------------------------------   8:14 AM on 09/23/2016 -----------------------------------------   BP (!) 143/82 (BP Location: Right Arm)   Pulse 88   Temp 98.5 F (36.9 C) (Oral)   Resp 18   Ht 5\' 2"  (1.575 m)   Wt 177 lb 4 oz (80.4 kg)   SpO2 98%   BMI 32.42 kg/m   No acute events since last update.  Disposition is pending per Psychiatry/Behavioral Medicine team recommendations.     Nance Pear, MD 09/23/16 [phone]

## 2016-09-23 NOTE — ED Notes (Signed)
Patient is voluntary and is pending placement. 

## 2016-09-23 NOTE — ED Notes (Signed)
Pt ate 1/4 of pasta plate for lunch

## 2016-09-24 LAB — GLUCOSE, CAPILLARY
GLUCOSE-CAPILLARY: 240 mg/dL — AB (ref 65–99)
GLUCOSE-CAPILLARY: 214 mg/dL — AB (ref 65–99)
GLUCOSE-CAPILLARY: 275 mg/dL — AB (ref 65–99)
Glucose-Capillary: 59 mg/dL — ABNORMAL LOW (ref 65–99)

## 2016-09-24 NOTE — ED Notes (Signed)
Patient observed lying in bed with eyes closed  Even, unlabored respirations observed   NAD pt appears to be sleeping  I will continue to monitor along with every 15 minute visual observations and ongoing security monitoring    

## 2016-09-24 NOTE — ED Notes (Addendum)
BEHAVIORAL HEALTH ROUNDING Patient sleeping: Yes.   Patient alert and oriented: not applicable SLEEPING Behavior appropriate: Yes.  ; If no, describe: SLEEPING Nutrition and fluids offered: No SLEEPING Toileting and hygiene offered: NoSLEEPING Sitter present: not applicable, Q 15 min safety rounds and observation. Law enforcement present: Yes ODS 

## 2016-09-24 NOTE — ED Notes (Signed)
BEHAVIORAL HEALTH ROUNDING Patient sleeping: No. Patient alert and oriented: yes Behavior appropriate: Yes.  ; If no, describe:  Nutrition and fluids offered: yes Toileting and hygiene offered: Yes  Sitter present: q15 minute observations and security  monitoring Law enforcement present: Yes  ODS  

## 2016-09-24 NOTE — ED Notes (Signed)
BEHAVIORAL HEALTH ROUNDING  Patient sleeping: No.  Patient alert and oriented: yes  Behavior appropriate: Yes. ; If no, describe:  Nutrition and fluids offered: Yes  Toileting and hygiene offered: Yes  Sitter present: not applicable, Q 15 min safety rounds and observation.  Law enforcement present: Yes ODS  

## 2016-09-24 NOTE — ED Notes (Signed)
Pt returned from HD  [place] - medic went to get her  NAD observed upon return  FSBS  53 when checked  OJ with 2 sugars provided and she is actively eating her lunch

## 2016-09-24 NOTE — Progress Notes (Signed)
Pre Dialysis 

## 2016-09-24 NOTE — ED Notes (Signed)
ED BHU [place] Is the patient under IVC or is there intent for IVC:  No voluntary   Is the patient medically cleared: Yes.   Is there vacancy in the ED BHU: Yes.   Is the population mix appropriate for patient: Yes.   Is the patient awaiting placement in inpatient or outpatient setting: Yes.  Group home placement with HD access   Has the patient had a psychiatric consult: Yes.   Survey of unit performed for contraband, proper placement and condition of furniture, tampering with fixtures in bathroom, shower, and each patient room: Yes.  ; Findings:  APPEARANCE/BEHAVIOR Calm and cooperative NEURO ASSESSMENT Orientation: oriented x4  Denies pain Hallucinations: No.None noted (Hallucinations) Speech: Normal Gait: normal RESPIRATORY ASSESSMENT Even  Unlabored respirations  CARDIOVASCULAR ASSESSMENT Pulses equal   regular rate  Skin warm and dry   GASTROINTESTINAL ASSESSMENT no GI complaint EXTREMITIES Full ROM  PLAN OF CARE Provide calm/safe environment. Vital signs assessed twice daily. ED BHU Assessment once each 12-hour shift. Collaborate with daily or as condition indicates. Assure the ED provider has rounded once each shift. Provide and encourage hygiene. Provide redirection as needed. Assess for escalating behavior; address immediately and inform ED provider.  Assess family dynamic and appropriateness for visitation as needed: Yes.  ; If necessary, describe findings:  Educate the patient/family about BHU procedures/visitation: Yes.  ; If necessary, describe findings:

## 2016-09-24 NOTE — Progress Notes (Signed)
Post dialysis assessment 

## 2016-09-24 NOTE — ED Notes (Signed)
VOL/Pending placement 

## 2016-09-24 NOTE — ED Provider Notes (Signed)
-----------------------------------------   8:20 AM on 09/24/2016 -----------------------------------------   BP (!) 143/64 (BP Location: Right Arm)   Pulse 84   Temp 98.4 F (36.9 C) (Oral)   Resp 14   Ht 5\' 2"  (1.575 m)   Wt 80.4 kg   SpO2 99%   BMI 32.42 kg/m   No acute events overnight. Vitals reviewed. Patient remains medically cleared.  Disposition is pending per Psychiatry/Behavioral Medicine team recommendations.    Lavonia Drafts, MD 09/24/16 [phone]

## 2016-09-24 NOTE — ED Notes (Signed)
Breakfast was given to patient. 

## 2016-09-24 NOTE — ED Notes (Signed)
BEHAVIORAL HEALTH ROUNDING Patient sleeping: Yes.   Patient alert and oriented: not applicable SLEEPING Behavior appropriate: Yes.  ; If no, describe: SLEEPING Nutrition and fluids offered: No SLEEPING Toileting and hygiene offered: NoSLEEPING Sitter present: not applicable, Q 15 min safety rounds and observation. Law enforcement present: Yes ODS 

## 2016-09-24 NOTE — Progress Notes (Signed)
CSW met with pt at pt's bedside. Pt states that she is not having a good day today because she called her aunt and found out that one of her family members passed. CSW provided emotional support and brief supportive counseling. Pt was able to state that she is looking forward to relaxing and watching a scary movie tonight. CSW encouraged pt to remain positive. CSW will continue to follow pt and assist as needed.   CSW called Ms. Watlington [phone]). Ms. Oswaldo Milian states that the single case agreement was resubmitted and she continues to await [facility] approval. CSW will check in again with her tomorrow.   [name], MSW, [place]

## 2016-09-24 NOTE — Progress Notes (Signed)
Dialysis initiated without issue.

## 2016-09-24 NOTE — ED Notes (Signed)

## 2016-09-24 NOTE — ED Notes (Signed)
Lunch was placed in room patient in dialysis

## 2016-09-24 NOTE — ED Notes (Signed)
Pt to HD escorted by [employer]

## 2016-09-24 NOTE — ED Notes (Signed)
BEHAVIORAL HEALTH ROUNDING Patient sleeping: No. Patient alert and oriented: yes Behavior appropriate: Yes.  ; If no, describe:  Nutrition and fluids offered: yes Toileting and hygiene offered: Yes  Sitter present: q15 minute observations and security monitoring Law enforcement present: Yes  ODS  She has eaten her salad and drinking her nepro  NAD assessed  No verbalized needs or concerns at this time

## 2016-09-24 NOTE — ED Notes (Signed)
Patient refused shower. 

## 2016-09-24 NOTE — ED Notes (Signed)
ENVIRONMENTAL ASSESSMENT  Potentially harmful objects out of patient reach: Yes.  Personal belongings secured: Yes.  Patient dressed in hospital provided attire only: Yes.  Plastic bags out of patient reach: Yes.  Patient care equipment (cords, cables, call bells, lines, and drains) shortened, removed, or accounted for: Yes.  Equipment and supplies removed from bottom of stretcher: Yes.  Potentially toxic materials out of patient reach: Yes.  Sharps container removed or out of patient reach: Yes.  ED BHU [place]  Is the patient under IVC or is there intent for IVC: No Is the patient medically cleared: Yes.  Is there vacancy in the ED BHU: Yes.  Is the population mix appropriate for patient: No Is the patient awaiting placement in inpatient or outpatient setting: Yes.  Has the patient had a psychiatric consult: Yes.  Survey of unit performed for contraband, proper placement and condition of furniture, tampering with fixtures in bathroom, shower, and each patient room: Yes. ; Findings: All clear  APPEARANCE/BEHAVIOR  calm, cooperative and adequate rapport can be established  NEURO ASSESSMENT  Orientation: time, place and person  Hallucinations: No.None noted (Hallucinations)  Speech: Normal  Gait: normal  RESPIRATORY ASSESSMENT  WNL  CARDIOVASCULAR ASSESSMENT  WNL  GASTROINTESTINAL ASSESSMENT  WNL  EXTREMITIES  WNL  PLAN OF CARE  Provide calm/safe environment. Vital signs assessed twice daily. ED BHU Assessment once each 12-hour shift. Collaborate with TTS/SW daily or as condition indicates. Assure the ED provider has rounded once each shift. Provide and encourage hygiene. Provide redirection as needed. Assess for escalating behavior; address immediately and inform ED provider.  Assess family dynamic and appropriateness for visitation as needed: Yes. ; If necessary, describe findings:  Educate the patient/family about BHU procedures/visitation: Yes. ; If necessary,  describe findings: Pt is calm and cooperative at this time. Pt understanding and accepting of unit procedures/rules. Will continue to monitor with Q 15 min safety rounds and observation  [place]  Patient sleeping: No.  Patient alert and oriented: yes  Behavior appropriate: Yes. ; If no, describe:  Nutrition and fluids offered: Yes  Toileting and hygiene offered: Yes  Sitter present: not applicable, Q 15 min safety rounds and observation.  Law enforcement present: Yes ODS .

## 2016-09-24 NOTE — Progress Notes (Signed)
Dialysis completed. Goal met

## 2016-09-24 NOTE — Progress Notes (Signed)
Subjective:   Seen and examined on hemodialysis. Tolerating treatment well.   States she is hungry. Asking for food. States she does not like to eat hospital food    HEMODIALYSIS FLOWSHEET:  Blood Flow Rate (mL/min): 400 mL/min Arterial Pressure (mmHg): -180 mmHg Venous Pressure (mmHg): 230 mmHg Transmembrane Pressure (mmHg): 40 mmHg Ultrafiltration Rate (mL/min): 720 mL/min Dialysate Flow Rate (mL/min): 800 ml/min Conductivity: Machine : 13.9 Conductivity: Machine : 13.9 Dialysis Fluid Bolus: Normal Saline Bolus Amount (mL): 250 mL Dialysate Change: 2K Intra-Hemodialysis Comments: 1881. Resting   Objective:  Vital signs in last 24 hours:  Temp:  [97.4 F (36.3 C)-99 F (37.2 C)] 99 F (37.2 C) (10/30 0920) Pulse Rate:  [77-92] 91 (10/30 1200) Resp:  [12-19] 16 (10/30 1200) BP: (113-152)/(64-103) 132/75 (10/30 1200) SpO2:  [99 %-100 %] 100 % (10/30 1200) Weight:  [85 kg (187 lb 6.3 oz)] 85 kg (187 lb 6.3 oz) (10/30 0920)  Weight change:  Filed Weights   09/21/16 0945 09/21/16 1258 09/24/16 0920  Weight: 82.4 kg (181 lb 10.5 oz) 80.4 kg (177 lb 4 oz) 85 kg (187 lb 6.3 oz)    Intake/Output:    Intake/Output Summary (Last 24 hours) at 09/24/16 1219 Last data filed at 09/23/16 1422  Gross per 24 hour  Intake              118 ml  Output                0 ml  Net              118 ml     Physical Exam: General: No acute distress  HEENT [place]/AT hearing intact  Neck Supple   Pulm/lungs Normal breathing effort, clear to auscultation   CVS/Heart Regular rhythm, no rub or gallop, Systolic murmur present  Abdomen:  Soft, nontender, nondistended   Extremities: No peripheral edema   Neurologic: Awake, alert, following commands  Skin: No acute rashes   Access: Left upper arm AVG       Basic Metabolic Panel:   Recent Labs Lab 09/19/16 0950 09/21/16 0950  NA 135 134*  K 4.5 5.3*  CL 93* 91*  CO2 26 26  GLUCOSE 153* 213*  BUN 84* 95*  CREATININE 9.47* 8.68*   CALCIUM 9.8 9.7  PHOS 4.7* 4.7*     CBC:  Recent Labs Lab 09/19/16 0950 09/21/16 0950  WBC 6.1 4.8  HGB 10.4* 9.7*  HCT 30.8* 28.8*  MCV 94.2 94.4  PLT 172 169      Microbiology:  No results found for this or any previous visit (from the past 720 hour(s)).  Coagulation Studies: No results for input(s): LABPROT, INR in the last 72 hours.  Urinalysis: No results for input(s): COLORURINE, LABSPEC, PHURINE, GLUCOSEU, HGBUR, BILIRUBINUR, KETONESUR, PROTEINUR, UROBILINOGEN, NITRITE, LEUKOCYTESUR in the last 72 hours.  Invalid input(s): APPERANCEUR    Imaging: No results found.   Medications:   .  ceFAZolin (ANCEF) IV Stopped (09/13/16 1811)   . acetaminophen  650 mg Oral Once  . amLODipine  10 mg Oral QPM  . aspirin  81 mg Oral Daily  . cholecalciferol  800 Units Oral Daily  . cloNIDine  0.1 mg Oral BID  . divalproex  1,000 mg Oral QHS  . epoetin (EPOGEN/PROCRIT) injection  4,000 Units Intravenous Q M,W,F-HD  . escitalopram  15 mg Oral Daily  . feeding supplement (NEPRO CARB STEADY)  237 mL Oral BID BM  . hydrALAZINE  25 mg  Oral TID  . insulin aspart  0-15 Units Subcutaneous TID WC  . insulin aspart  0-5 Units Subcutaneous QHS  . irbesartan  300 mg Oral QHS  . metoprolol tartrate  50 mg Oral BID  . patiromer  8.4 g Oral Q24H  . QUEtiapine  200 mg Oral BID  . traZODone  100 mg Oral QHS   acetaminophen, heparin  Assessment/ Plan:  35 y.o. African-American female with developmental delay, ESRD, AOCD, SHPTH, HTN, schizophrenia, mild cognitive impairment, diabetes mellitus type II  [facility] MWF [address]  1. Hyperkalemia:  Continued on patiromer on a daily basis - 2K bath with dialysis  2. Hypertension:  - Amlodipine, clonidine, hydralazine, irbesartan, metoprolol - recently decreased the dose of hydralazine  3. Anemia of chronic kidney disease  - continue epogen.   4. Secondary Hyperparathyroidism: phosphorus 4.7, calcium at goal.  - not  currently on binders  5. End Stage Renal Disease: on hemodialysis MWF schedule.   6. DM-2 with CKD Insulin dependent    LOS: 0 Mayana Irigoyen 10/30/201712:19 PM

## 2016-09-24 NOTE — Progress Notes (Signed)
Pre-Dialysis Assessment. 

## 2016-09-25 LAB — GLUCOSE, CAPILLARY
GLUCOSE-CAPILLARY: 254 mg/dL — AB (ref 65–99)
GLUCOSE-CAPILLARY: 96 mg/dL (ref 65–99)
Glucose-Capillary: 159 mg/dL — ABNORMAL HIGH (ref 65–99)
Glucose-Capillary: 253 mg/dL — ABNORMAL HIGH (ref 65–99)

## 2016-09-25 NOTE — ED Notes (Signed)
Pt given snack,cereal and milk.

## 2016-09-25 NOTE — ED Provider Notes (Signed)
-----------------------------------------   8:10 AM on 09/25/2016 -----------------------------------------   Blood pressure (!) 120/58, pulse 94, temperature 98.2 F (36.8 C), temperature source Oral, resp. rate 18, height 5\' 2"  (1.575 m), weight 181 lb 10.5 oz (82.4 kg), SpO2 100 %.  The patient had no acute events since last update.  Calm and cooperative at this time.  Patient still awaiting placement.     Loney Hering, MD 09/25/16 [phone]

## 2016-09-25 NOTE — ED Notes (Signed)
Pt  Vol  Pending  Placement

## 2016-09-25 NOTE — Progress Notes (Signed)
CSW spoke to Ms. Watlington [phone]. Ms. Watlington states that she has received a request from [facility] to change the single case agreement again and resubmit. CSW has also spoken with pt's care coordinator [facility] Secor [phone]). Lajean Manes also states that [facility] is asking group home to resubmit case agreement again.  CSW discussed above with her supervisor. CSW's supervisor would like to make sure that Ms. Oswaldo Milian is properly supported in resubmitting the case agreement to avoid any future mistakes. CSW contacted Ms.Hunt and she stated that she will continue to support Ms. Watlington in resubmitting the agreement. Ms. Oswaldo Milian will be calling [facility] in the morning to figure out how to move forward with the agreement and fulfil any further requests that [facility] has.   CSW will continue to follow pt and assist as needed.  [name], MSW, [place]

## 2016-09-25 NOTE — ED Notes (Signed)
Faxed menu selections  for Wednesday meals sent to dinning facility.

## 2016-09-25 NOTE — ED Notes (Signed)
Pt refusing to take meds or vs taken

## 2016-09-26 LAB — BASIC METABOLIC PANEL
ANION GAP: 15 (ref 5–15)
BUN: 109 mg/dL — AB (ref 6–20)
CHLORIDE: 92 mmol/L — AB (ref 101–111)
CO2: 27 mmol/L (ref 22–32)
Calcium: 9.6 mg/dL (ref 8.9–10.3)
Creatinine, Ser: 8.84 mg/dL — ABNORMAL HIGH (ref 0.44–1.00)
GFR calc Af Amer: 6 mL/min — ABNORMAL LOW (ref >60)
GFR, EST NON AFRICAN AMERICAN: 5 mL/min — AB (ref >60)
GLUCOSE: 232 mg/dL — AB (ref 65–99)
POTASSIUM: 5.4 mmol/L — AB (ref 3.5–5.1)
Sodium: 134 mmol/L — ABNORMAL LOW (ref 135–145)

## 2016-09-26 LAB — GLUCOSE, CAPILLARY
GLUCOSE-CAPILLARY: 159 mg/dL — AB (ref 65–99)
GLUCOSE-CAPILLARY: 234 mg/dL — AB (ref 65–99)
Glucose-Capillary: 211 mg/dL — ABNORMAL HIGH (ref 65–99)
Glucose-Capillary: 148 mg/dL — ABNORMAL HIGH (ref 65–99)

## 2016-09-26 LAB — PHOSPHORUS: Phosphorus: 4.2 mg/dL (ref 2.5–4.6)

## 2016-09-26 NOTE — ED Notes (Signed)
Pt returned from dialysis.  Pt alert and oriented.  Pt given meal tray.

## 2016-09-26 NOTE — Progress Notes (Signed)
Start of hd 

## 2016-09-26 NOTE — ED Notes (Signed)
Pt taken to dialysis via wheelchair.

## 2016-09-26 NOTE — Progress Notes (Signed)
Pre hd assessment  

## 2016-09-26 NOTE — Progress Notes (Signed)
End of hd tx  

## 2016-09-26 NOTE — ED Notes (Signed)
Pt walking around room and standing at doorway

## 2016-09-26 NOTE — ED Notes (Signed)
Dr. Weber Cooks brought [employer] to the patient for dinner.

## 2016-09-26 NOTE — ED Notes (Signed)
Pt escorted to dialysis by WC.

## 2016-09-26 NOTE — Progress Notes (Signed)
CSW met with pt at pt's bedside. Pt reports that she is having a good day today and is feeling well. Pt requested an update on her status and CSW stated that the group home is still waiting for [facility] to approve the single case agreement. CSW provided encouragement to pt and thanked her for her continued patience with the process.  CSW will continue to follow pt and assist as needed.  [name], MSW, [place]

## 2016-09-26 NOTE — Progress Notes (Signed)
Pre hd info 

## 2016-09-26 NOTE — ED Notes (Signed)
CBG 211. RN notified. Pt given breakfast tray.

## 2016-09-26 NOTE — ED Notes (Signed)
Pt sleeping. 

## 2016-09-26 NOTE — Progress Notes (Signed)
Post hd vitals 

## 2016-09-26 NOTE — ED Notes (Signed)
Pt sleeping at this time. Will offer snack when she wakes up.

## 2016-09-26 NOTE — Progress Notes (Signed)
Subjective:   Seen and examined on hemodialysis. Tolerating treatment well.   States she is hungry. Asking for food. States she does not like to eat hospital food Face appears more swollen today    HEMODIALYSIS FLOWSHEET:  Blood Flow Rate (mL/min): 400 mL/min Arterial Pressure (mmHg): -240 mmHg Venous Pressure (mmHg): 180 mmHg Transmembrane Pressure (mmHg): 70 mmHg Ultrafiltration Rate (mL/min): 860 mL/min Dialysate Flow Rate (mL/min): 800 ml/min Conductivity: Machine : 14 Conductivity: Machine : 14 Dialysis Fluid Bolus: Normal Saline Bolus Amount (mL): 250 mL (prime) Dialysate Change: 2K Intra-Hemodialysis Comments: 2514. Resting   Objective:  Vital signs in last 24 hours:  Temp:  [97.8 F (36.6 C)-98.3 F (36.8 C)] 97.8 F (36.6 C) (11/01 1005) Pulse Rate:  [90-106] 106 (11/01 1300) Resp:  [15-24] 18 (11/01 1300) BP: (115-154)/(48-93) 128/68 (11/01 1300) SpO2:  [99 %-100 %] 100 % (11/01 1300) Weight:  [85.7 kg (188 lb 15 oz)] 85.7 kg (188 lb 15 oz) (11/01 1005)  Weight change:  Filed Weights   09/24/16 0920 09/24/16 1258 09/26/16 1005  Weight: 85 kg (187 lb 6.3 oz) 82.4 kg (181 lb 10.5 oz) 85.7 kg (188 lb 15 oz)    Intake/Output:   No intake or output data in the 24 hours ending 09/26/16 1336   Physical Exam: General: No acute distress  HEENT [place]/AT hearing intact  Neck Supple   Pulm/lungs Normal breathing effort, clear to auscultation   CVS/Heart Regular rhythm, no rub or gallop, Systolic murmur present  Abdomen:  Soft, nontender, nondistended   Extremities: No peripheral edema   Neurologic: Awake, alert, following commands  Skin: No acute rashes   Access: Left upper arm AVG       Basic Metabolic Panel:   Recent Labs Lab 09/21/16 0950 09/26/16 1010  NA 134* 134*  K 5.3* 5.4*  CL 91* 92*  CO2 26 27  GLUCOSE 213* 232*  BUN 95* 109*  CREATININE 8.68* 8.84*  CALCIUM 9.7 9.6  PHOS 4.7* 4.2     CBC:  Recent Labs Lab 09/21/16 0950  WBC  4.8  HGB 9.7*  HCT 28.8*  MCV 94.4  PLT 169      Microbiology:  No results found for this or any previous visit (from the past 720 hour(s)).  Coagulation Studies: No results for input(s): LABPROT, INR in the last 72 hours.  Urinalysis: No results for input(s): COLORURINE, LABSPEC, PHURINE, GLUCOSEU, HGBUR, BILIRUBINUR, KETONESUR, PROTEINUR, UROBILINOGEN, NITRITE, LEUKOCYTESUR in the last 72 hours.  Invalid input(s): APPERANCEUR    Imaging: No results found.   Medications:   .  ceFAZolin (ANCEF) IV Stopped (09/13/16 1811)   . acetaminophen  650 mg Oral Once  . amLODipine  10 mg Oral QPM  . aspirin  81 mg Oral Daily  . cholecalciferol  800 Units Oral Daily  . cloNIDine  0.1 mg Oral BID  . divalproex  1,000 mg Oral QHS  . epoetin (EPOGEN/PROCRIT) injection  4,000 Units Intravenous Q M,W,F-HD  . escitalopram  15 mg Oral Daily  . feeding supplement (NEPRO CARB STEADY)  237 mL Oral BID BM  . hydrALAZINE  25 mg Oral TID  . insulin aspart  0-15 Units Subcutaneous TID WC  . insulin aspart  0-5 Units Subcutaneous QHS  . irbesartan  300 mg Oral QHS  . metoprolol tartrate  50 mg Oral BID  . patiromer  8.4 g Oral Q24H  . QUEtiapine  200 mg Oral BID  . traZODone  100 mg Oral QHS  acetaminophen, heparin  Assessment/ Plan:  35 y.o. African-American female with developmental delay, ESRD, AOCD, SHPTH, HTN, schizophrenia, mild cognitive impairment, diabetes mellitus type II  [facility] MWF [address]  1. Hyperkalemia:  Continued on patiromer on a daily basis - 2K bath with dialysis  2. Hypertension:  - Amlodipine, clonidine, hydralazine, irbesartan, metoprolol - systolic 161-096  3. Anemia of chronic kidney disease  - continue epogen.   4. Secondary Hyperparathyroidism: phosphorus 4.2, calcium at goal.  - not currently on binders  5. End Stage Renal Disease: on hemodialysis MWF schedule.  - dialysis today - BUN > 100 - keep HD time to 4 hrs  6. DM-2 with  CKD Insulin dependent    LOS: 0 Adison Reifsteck 11/1/20171:36 PM

## 2016-09-26 NOTE — Progress Notes (Signed)
Post hd assessment 

## 2016-09-26 NOTE — ED Notes (Signed)
Received report from [facility] care assumed,  Pt asleep at this time.

## 2016-09-26 NOTE — ED Notes (Signed)
Vital signs deferred due to agitation in the unit.

## 2016-09-27 LAB — GLUCOSE, CAPILLARY
GLUCOSE-CAPILLARY: 120 mg/dL — AB (ref 65–99)
Glucose-Capillary: 122 mg/dL — ABNORMAL HIGH (ref 65–99)
Glucose-Capillary: 204 mg/dL — ABNORMAL HIGH (ref 65–99)

## 2016-09-27 NOTE — ED Notes (Signed)
Patient given bed bath in her room, new clean clothes, and bed linens changed bed wiped down.

## 2016-09-27 NOTE — ED Provider Notes (Signed)
-----------------------------------------   7:33 AM on 09/27/2016 -----------------------------------------   Blood pressure 127/74, pulse 81, temperature 98.1 F (36.7 C), temperature source Oral, resp. rate 18, height 5\' 2"  (1.575 m), weight 182 lb 5.1 oz (82.7 kg), SpO2 99 %.  The patient had no acute events since last update.  Calm and cooperative at this time.  Disposition is pending Psychiatry/Behavioral Medicine team recommendations.     Carrie Mew, MD 09/27/16 [phone]

## 2016-09-27 NOTE — ED Notes (Signed)
BEHAVIORAL HEALTH ROUNDING Patient sleeping: Yes.   Patient alert and oriented: eyes closed  Appears to be asleep Behavior appropriate: Yes.  ; If no, describe:  Nutrition and fluids offered: sleeping Toileting and hygiene offered: sleeping Sitter present: q 15 minute observations and security monitoring Law enforcement present: yes  ODS 

## 2016-09-27 NOTE — ED Notes (Signed)
Attempted to collect vital signs and CBG for this patient, and she refused. She agreed that she will let me collect her vital signs and check her blood sugar "after (she) gets a little sleep."

## 2016-09-27 NOTE — ED Notes (Signed)
BS 120

## 2016-09-27 NOTE — ED Notes (Signed)
ED BHU [place] Is the patient under IVC or is there intent for IVC:   voluntary   Is the patient medically cleared: Yes.   Is there vacancy in the ED BHU: Yes.   Is the population mix appropriate for patient: Yes.   Is the patient awaiting placement in inpatient or outpatient setting: Yes.   Group home placement with HD access Has the patient had a psychiatric consult: Yes.   Survey of unit performed for contraband, proper placement and condition of furniture, tampering with fixtures in bathroom, shower, and each patient room: Yes.  ; Findings:  APPEARANCE/BEHAVIOR Calm and cooperative NEURO ASSESSMENT Orientation: oriented x 4  Denies pain Hallucinations: No.None noted (Hallucinations) Speech: Normal Gait: normal RESPIRATORY ASSESSMENT Even  Unlabored respirations  CARDIOVASCULAR ASSESSMENT Pulses equal   regular rate  Skin warm and dry   GASTROINTESTINAL ASSESSMENT no GI complaint EXTREMITIES Full ROM  PLAN OF CARE Provide calm/safe environment. Vital signs assessed twice daily. ED BHU Assessment once each 12-hour shift. Collaborate with TTS daily  or as condition indicates. Assure the ED provider has rounded once each shift. Provide and encourage hygiene. Provide redirection as needed. Assess for escalating behavior; address immediately and inform ED provider.  Assess family dynamic and appropriateness for visitation as needed: Yes.  ; If necessary, describe findings:  Educate the patient/family about BHU procedures/visitation: Yes.  ; If necessary, describe findings:

## 2016-09-27 NOTE — ED Notes (Signed)
She has ambulated back from the BR and her nose is bleeding  - tissues provided and I held pressure while she held her head back - bleeding stopped within 2 minutes   Pt reports a BM today and I feel nose bleed came from her sticking her finger in her nose too aggressively   I will continue to monitor

## 2016-09-27 NOTE — ED Notes (Signed)
Patient observed lying in bed with eyes closed  Even, unlabored respirations observed   NAD pt appears to be sleeping  I will continue to monitor along with every 15 minute visual observations and ongoing security monitoring    

## 2016-09-27 NOTE — ED Notes (Signed)
BEHAVIORAL HEALTH ROUNDING Patient sleeping: No. Patient alert and oriented: yes Behavior appropriate: Yes.  ; If no, describe:  Nutrition and fluids offered: yes Toileting and hygiene offered: Yes  Sitter present: q15 minute observations and security  monitoring Law enforcement present: Yes  ODS  

## 2016-09-27 NOTE — Progress Notes (Signed)
CSW received a call from Kelly [name] (pt's care coordinator [phone]). The financial aspect of the case agreement has been approved. [facility] is requesting that [facility] change 2 other aspects of the paperwork and submit it no later than 12pm tomorrow. After the submission at 12pm [facility] will be able to officially approve the single case agreement within an hour or so.   CSW called Kelly [name] to discuss the above. Kelly [name] states that if everything goes as planned she would be available to pick pt up from the ED on Monday 11/6. POTENTIAL d/c Monday 11/6 pending approval of [facility] paperwork.  CSW called Kelly [name] at [facility] [phone]) and informed her of above. Debbie states that [facility] is ready for pt to begin dialysis on Monday 11/6 on a MWF schedule at 3pm. Kelly [name] will contact Dr. Holley Raring to send orders.  Due to reoccurring changes to d/c plan resulting in pt disappointment and anxiety, please DO NOT inform pt of potential discharge until it is finalized.  CSW will continue to follow pt and assist as needed.   [name], MSW, [place]

## 2016-09-27 NOTE — ED Notes (Signed)
BEHAVIORAL HEALTH ROUNDING Patient sleeping: Yes.   Patient alert and oriented: eyes closed  Appears to be asleep Behavior appropriate: Yes.  ; If no, describe:  Nutrition and fluids offered:  Sleeping  Supper placed in her room  Toileting and hygiene offered: sleeping Sitter present: q 15 minute observations and security monitoring Law enforcement present: yes  ODS

## 2016-09-27 NOTE — ED Notes (Signed)
Patient requested graham crackers and peanut butter. RN Evlyn Kanner agreed that she could have them since she has not eaten much today. Snack was provided.

## 2016-09-27 NOTE — ED Notes (Signed)
BEHAVIORAL HEALTH ROUNDING Patient sleeping: Yes.   Patient alert and oriented: eyes closed  Appears to be asleep Behavior appropriate: Yes.  ; If no, describe:  Nutrition and fluids offered: sleeping Toileting and hygiene offered: sleeping Sitter present: q 15 minute observations and security  monitoring Law enforcement present: yes  ODS  ENVIRONMENTAL ASSESSMENT Potentially harmful objects out of patient reach: Yes.   Personal belongings secured: Yes.   Patient dressed in hospital provided attire only: Yes.   Plastic bags out of patient reach: Yes.   Patient care equipment (cords, cables, call bells, lines, and drains) shortened, removed, or accounted for: Yes.   Equipment and supplies removed from bottom of stretcher: Yes.   Potentially toxic materials out of patient reach: Yes.   Sharps container removed or out of patient reach: Yes.

## 2016-09-27 NOTE — ED Notes (Signed)
VOL/Pending Placement 

## 2016-09-28 LAB — RENAL FUNCTION PANEL
ALBUMIN: 3.6 g/dL (ref 3.5–5.0)
Anion gap: 14 (ref 5–15)
BUN: 87 mg/dL — ABNORMAL HIGH (ref 6–20)
CHLORIDE: 94 mmol/L — AB (ref 101–111)
CO2: 27 mmol/L (ref 22–32)
CREATININE: 8.17 mg/dL — AB (ref 0.44–1.00)
Calcium: 9.9 mg/dL (ref 8.9–10.3)
GFR, EST AFRICAN AMERICAN: 7 mL/min — AB (ref >60)
GFR, EST NON AFRICAN AMERICAN: 6 mL/min — AB (ref >60)
Glucose, Bld: 117 mg/dL — ABNORMAL HIGH (ref 65–99)
PHOSPHORUS: 4.4 mg/dL (ref 2.5–4.6)
Potassium: 6.1 mmol/L — ABNORMAL HIGH (ref 3.5–5.1)
Sodium: 135 mmol/L (ref 135–145)

## 2016-09-28 LAB — GLUCOSE, CAPILLARY
GLUCOSE-CAPILLARY: 161 mg/dL — AB (ref 65–99)
GLUCOSE-CAPILLARY: 133 mg/dL — AB (ref 65–99)
GLUCOSE-CAPILLARY: 273 mg/dL — AB (ref 65–99)
Glucose-Capillary: 107 mg/dL — ABNORMAL HIGH (ref 65–99)
Glucose-Capillary: 256 mg/dL — ABNORMAL HIGH (ref 65–99)

## 2016-09-28 LAB — CBC
HCT: 28.9 % — ABNORMAL LOW (ref 35.0–47.0)
Hemoglobin: 9.9 g/dL — ABNORMAL LOW (ref 12.0–16.0)
MCH: 32.3 pg (ref 26.0–34.0)
MCHC: 34.4 g/dL (ref 32.0–36.0)
MCV: 93.8 fL (ref 80.0–100.0)
PLATELETS: 173 10*3/uL (ref 150–440)
RBC: 3.08 MIL/uL — AB (ref 3.80–5.20)
RDW: 14.4 % (ref 11.5–14.5)
WBC: 4.6 10*3/uL (ref 3.6–11.0)

## 2016-09-28 NOTE — Progress Notes (Signed)
Dialysis initiated without issue

## 2016-09-28 NOTE — ED Notes (Signed)
Pt standing in hallway. CN informed that pt will not return to room. Security will not assist due to pt not being IVC. CN tried to get pt back to rooma nd pt refused. Pt still standing in hallway.

## 2016-09-28 NOTE — Progress Notes (Signed)
Pre dialysis assessment 

## 2016-09-28 NOTE — ED Notes (Signed)
Blood sugar was 133 notified nurse Brandy D.

## 2016-09-28 NOTE — ED Notes (Signed)
Dinner request placed for pt

## 2016-09-28 NOTE — ED Notes (Signed)
Pt given Applesauce at this time.

## 2016-09-28 NOTE — ED Notes (Signed)
Pt. Noted in room. No complaints or concerns voiced. No distress or abnormal behavior noted. Will continue to monitor with Q 15 minute rounds. Pt to go to dialysis today.

## 2016-09-28 NOTE — Progress Notes (Signed)
Pre dialysis  

## 2016-09-28 NOTE — ED Provider Notes (Signed)
-----------------------------------------   4:48 AM on 09/28/2016 -----------------------------------------   Blood pressure (!) 157/99, pulse 94, temperature 98.4 F (36.9 C), temperature source Oral, resp. rate 18, height 5\' 2"  (1.575 m), weight 182 lb 5.1 oz (82.7 kg), SpO2 99 %.  The patient had no acute events since last update.  Calm and cooperative at this time.  Disposition is pending Psychiatry/Behavioral Medicine team recommendations.      Daymon Larsen, MD 09/28/16 [phone]

## 2016-09-28 NOTE — ED Notes (Signed)
Pt taken to dialysis via w/c by felicia ed tech

## 2016-09-28 NOTE — ED Notes (Addendum)
Pt returned from dialysis

## 2016-09-28 NOTE — ED Notes (Signed)
Pt becoming agitated with patient next door, patient wanting cheese for her salad and becoming angry, states that " I don't want to be here anymore, I signed myself in here and I can take myself out, I want a drink of liquor and some sex, I haven't had sex in a long time, I can sleep on the street, I don't give a f##k"

## 2016-09-28 NOTE — Progress Notes (Signed)
Hemodialysis- Pt tolerated well without issue. 4 hour treatment with 2.5L removed. Report called to [facility] in ED.

## 2016-09-28 NOTE — Progress Notes (Signed)
Dialysis completed without issue

## 2016-09-28 NOTE — Progress Notes (Signed)
Spoke to [place]- Patient is going to [name] [address] [address] and will start [place] [facility] on Wednesday as per Freida Busman.  Rich Fuchs will call group home and let her know she has been approved and the changes have been accepted by [facility].   [employer] LCSW [phone]

## 2016-09-28 NOTE — ED Notes (Signed)
IVC by Dr. Weber Cooks

## 2016-09-28 NOTE — Progress Notes (Signed)
Hemodialysis- Dr. Abigail Butts notified of K=6.1 this am. Order to increase treatment time to 4 hours. Done. Continue to monitor

## 2016-09-28 NOTE — ED Notes (Signed)
Pt didn't eat only a muffin for breakfast and 2 of her shakes. No lunch no dinner. Pt given 2 bowels of cereal milk and her shake she missed this am.

## 2016-09-28 NOTE — Progress Notes (Signed)
LCSW gave Kelly [name] a brief visit and hug. Will follow up with staff to put together a care basket for her departure on Tuesday.   Grant Henkes [place] LCSW [phone]

## 2016-09-28 NOTE — ED Notes (Signed)
ED BHU PLACEMENT JUSTIFICATION Is the patient under IVC or is there intent for IVC: No. Is the patient medically cleared: No. Is there vacancy in the ED BHU: Yes.   Is the population mix appropriate for patient: No. Is the patient awaiting placement in inpatient or outpatient setting: Yes.   Has the patient had a psychiatric consult: Yes.   Survey of unit performed for contraband, proper placement and condition of furniture, tampering with fixtures in bathroom, shower, and each patient room: Yes.  ; Findings: nothing APPEARANCE/BEHAVIOR calm, cooperative and adequate rapport can be established NEURO ASSESSMENT Orientation: time, place and person Hallucinations: No.None noted (Hallucinations) Speech: Normal Gait: normal RESPIRATORY ASSESSMENT Normal expansion.  Clear to auscultation.  No rales, rhonchi, or wheezing. CARDIOVASCULAR ASSESSMENT WNL GASTROINTESTINAL ASSESSMENT soft, nontender, BS WNL, no r/g EXTREMITIES normal strength, tone, and muscle mass PLAN OF CARE Provide calm/safe environment. Vital signs assessed Assure the ED provider has rounded once each shift. Provide and encourage hygiene. Provide redirection as needed. Assess for escalating behavior; address immediately and inform ED provider. Awaiting placement. Pt has dialysis in am.  Assess family dynamic and appropriateness for visitation as needed: No.; If necessary, describe findings:  Educate the patient/family about  procedures/visitation: Yes.  ;

## 2016-09-28 NOTE — Progress Notes (Signed)
Subjective:   Seen and examined on hemodialysis. Tolerating treatment well.   States she is hungry. Asking for food. States she does not like to eat hospital food Face appears more swollen today    HEMODIALYSIS FLOWSHEET:  Blood Flow Rate (mL/min): 400 mL/min Arterial Pressure (mmHg): -210 mmHg Venous Pressure (mmHg): 200 mmHg Transmembrane Pressure (mmHg): 70 mmHg Ultrafiltration Rate (mL/min): 860 mL/min Dialysate Flow Rate (mL/min): 800 ml/min Conductivity: Machine : 14 Conductivity: Machine : 14 Dialysis Fluid Bolus: Normal Saline Bolus Amount (mL): 250 mL Dialysate Change: 2K Intra-Hemodialysis Comments: 410. tolerating well   Objective:  Vital signs in last 24 hours:  Temp:  [98 F (36.7 C)-98.4 F (36.9 C)] 98 F (36.7 C) (11/03 0825) Pulse Rate:  [81-97] 92 (11/03 0900) Resp:  [15-21] 21 (11/03 0900) BP: (118-157)/(65-99) 118/65 (11/03 0900) SpO2:  [98 %-100 %] 100 % (11/03 0900) Weight:  [85.1 kg (187 lb 9.8 oz)] 85.1 kg (187 lb 9.8 oz) (11/03 0825)  Weight change:  Filed Weights   09/26/16 1005 09/26/16 1340 09/28/16 0825  Weight: 85.7 kg (188 lb 15 oz) 82.7 kg (182 lb 5.1 oz) 85.1 kg (187 lb 9.8 oz)    Intake/Output:    Intake/Output Summary (Last 24 hours) at 09/28/16 0909 Last data filed at 09/28/16 0630  Gross per 24 hour  Intake              120 ml  Output                0 ml  Net              120 ml     Physical Exam: General: No acute distress  HEENT [place]/AT hearing intact  Neck Supple   Pulm/lungs Normal breathing effort, clear to auscultation   CVS/Heart Regular rhythm, no rub or gallop, Systolic murmur present  Abdomen:  Soft, nontender, nondistended   Extremities: No peripheral edema   Neurologic: Awake, alert, following commands  Skin: No acute rashes   Access: Left upper arm AVG       Basic Metabolic Panel:   Recent Labs Lab 09/21/16 0950 09/26/16 1010  NA 134* 134*  K 5.3* 5.4*  CL 91* 92*  CO2 26 27  GLUCOSE 213*  232*  BUN 95* 109*  CREATININE 8.68* 8.84*  CALCIUM 9.7 9.6  PHOS 4.7* 4.2     CBC:  Recent Labs Lab 09/21/16 0950 09/28/16 0830  WBC 4.8 4.6  HGB 9.7* 9.9*  HCT 28.8* 28.9*  MCV 94.4 93.8  PLT 169 173      Microbiology:  No results found for this or any previous visit (from the past 720 hour(s)).  Coagulation Studies: No results for input(s): LABPROT, INR in the last 72 hours.  Urinalysis: No results for input(s): COLORURINE, LABSPEC, PHURINE, GLUCOSEU, HGBUR, BILIRUBINUR, KETONESUR, PROTEINUR, UROBILINOGEN, NITRITE, LEUKOCYTESUR in the last 72 hours.  Invalid input(s): APPERANCEUR    Imaging: No results found.   Medications:   .  ceFAZolin (ANCEF) IV Stopped (09/13/16 1811)   . acetaminophen  650 mg Oral Once  . amLODipine  10 mg Oral QPM  . aspirin  81 mg Oral Daily  . cholecalciferol  800 Units Oral Daily  . cloNIDine  0.1 mg Oral BID  . divalproex  1,000 mg Oral QHS  . epoetin (EPOGEN/PROCRIT) injection  4,000 Units Intravenous Q M,W,F-HD  . escitalopram  15 mg Oral Daily  . feeding supplement (NEPRO CARB STEADY)  237 mL Oral BID BM  .  hydrALAZINE  25 mg Oral TID  . insulin aspart  0-15 Units Subcutaneous TID WC  . insulin aspart  0-5 Units Subcutaneous QHS  . irbesartan  300 mg Oral QHS  . metoprolol tartrate  50 mg Oral BID  . patiromer  8.4 g Oral Q24H  . QUEtiapine  200 mg Oral BID  . traZODone  100 mg Oral QHS   acetaminophen, heparin  Assessment/ Plan:  35 y.o. African-American female with developmental delay, ESRD, AOCD, SHPTH, HTN, schizophrenia, mild cognitive impairment, diabetes mellitus type II  [facility] MWF [address]  1. Hyperkalemia:  Continued on patiromer on a daily basis - 2K bath with dialysis  2. Hypertension:  - Amlodipine, clonidine, hydralazine, irbesartan, metoprolol  3. Anemia of chronic kidney disease  - continue epogen.   4. Secondary Hyperparathyroidism: phosphorus at goal, calcium at goal.  - not  currently on binders  5. End Stage Renal Disease: on hemodialysis MWF schedule. 4 hour treatment due to BUN 109 - dialysis today. Tolerating treatment well.   6. DM-2 with CKD Insulin dependent    LOS: 0 Cleophus Mendonsa, [place] 11/3/20179:09 AM

## 2016-09-28 NOTE — ED Notes (Signed)
Dr Burlene Arnt aware of patient wanting to leave, pt offered an icey, pt refusing to eat her grilled chicken salad

## 2016-09-28 NOTE — Progress Notes (Signed)
Post dialysis assessment 

## 2016-09-29 LAB — GLUCOSE, CAPILLARY
GLUCOSE-CAPILLARY: 268 mg/dL — AB (ref 65–99)
GLUCOSE-CAPILLARY: 168 mg/dL — AB (ref 65–99)
GLUCOSE-CAPILLARY: 148 mg/dL — AB (ref 65–99)
Glucose-Capillary: 104 mg/dL — ABNORMAL HIGH (ref 65–99)
Glucose-Capillary: 187 mg/dL — ABNORMAL HIGH (ref 65–99)

## 2016-09-29 LAB — HEPATITIS B SURFACE ANTIGEN: Hepatitis B Surface Ag: NEGATIVE

## 2016-09-29 NOTE — ED Notes (Signed)
Pt given rice crispies and two warm blankets

## 2016-09-29 NOTE — ED Notes (Signed)
EMR record clean up att

## 2016-09-29 NOTE — ED Provider Notes (Signed)
-----------------------------------------   7:56 AM on 09/29/2016 -----------------------------------------   Blood pressure 131/75, pulse 88, temperature 98.3 F (36.8 C), temperature source Oral, resp. rate 20, height 5\' 2"  (1.575 m), weight 181 lb 10.5 oz (82.4 kg), SpO2 98 %.  The patient had no acute events since last update.  Calm and cooperative at this time.  Disposition is pending Psychiatry/Behavioral Medicine team recommendations.     Merlyn Lot, MD 09/29/16 [phone]

## 2016-09-30 LAB — GLUCOSE, CAPILLARY
GLUCOSE-CAPILLARY: 63 mg/dL — AB (ref 65–99)
GLUCOSE-CAPILLARY: 221 mg/dL — AB (ref 65–99)
Glucose-Capillary: 97 mg/dL (ref 65–99)
Glucose-Capillary: 260 mg/dL — ABNORMAL HIGH (ref 65–99)

## 2016-09-30 LAB — BASIC METABOLIC PANEL
Anion gap: 10 (ref 5–15)
BUN: 71 mg/dL — AB (ref 6–20)
CO2: 31 mmol/L (ref 22–32)
CREATININE: 7.51 mg/dL — AB (ref 0.44–1.00)
Calcium: 9.5 mg/dL (ref 8.9–10.3)
Chloride: 93 mmol/L — ABNORMAL LOW (ref 101–111)
GFR, EST AFRICAN AMERICAN: 7 mL/min — AB (ref >60)
GFR, EST NON AFRICAN AMERICAN: 6 mL/min — AB (ref >60)
Glucose, Bld: 106 mg/dL — ABNORMAL HIGH (ref 65–99)
POTASSIUM: 6.2 mmol/L — AB (ref 3.5–5.1)
SODIUM: 134 mmol/L — AB (ref 135–145)

## 2016-09-30 MED ORDER — ESCITALOPRAM OXALATE 10 MG PO TABS
ORAL_TABLET | ORAL | Status: AC
  Administered 2016-09-30: 15 mg via ORAL
  Filled 2016-09-30: qty 2

## 2016-09-30 NOTE — ED Notes (Signed)
BEHAVIORAL HEALTH ROUNDING  Patient sleeping: No.  Patient alert and oriented: yes  Behavior appropriate: Yes. ; If no, describe:  Nutrition and fluids offered: Yes  Toileting and hygiene offered: Yes  Sitter present: not applicable, Q 15 min safety rounds and observation.  Law enforcement present: Yes ODS  

## 2016-09-30 NOTE — ED Notes (Signed)
Patient received breakfast tray 

## 2016-09-30 NOTE — Progress Notes (Signed)
LCSW called Group Home Provider [name] and she has confirmed she will be picking up Kelly [name] on Tuesday November the 7th at [place] called [facility] on Friday and confirmed that patient will be attending on Wednesday Nov 8th.  LCSW weekend sw will update weekday SW on handoff.   Clauidne Lawndale LCSW [phone]

## 2016-09-30 NOTE — ED Notes (Signed)
ENVIRONMENTAL ASSESSMENT  Potentially harmful objects out of patient reach: Yes.  Personal belongings secured: Yes.  Patient dressed in hospital provided attire only: Yes.  Plastic bags out of patient reach: Yes.  Patient care equipment (cords, cables, call bells, lines, and drains) shortened, removed, or accounted for: Yes.  Equipment and supplies removed from bottom of stretcher: Yes.  Potentially toxic materials out of patient reach: Yes.  Sharps container removed or out of patient reach: Yes.   BEHAVIORAL HEALTH ROUNDING  Patient sleeping: No.  Patient alert and oriented: yes  Behavior appropriate: Yes. ; If no, describe:  Nutrition and fluids offered: Yes  Toileting and hygiene offered: Yes  Sitter present: not applicable, Q 15 min safety rounds and observation.  Law enforcement present: Yes ODS  ED [place]  Is the patient under IVC or is there intent for IVC: Yes.  Is the patient medically cleared: Yes.  Is there vacancy in the ED BHU: Yes.  Is the population mix appropriate for patient: No Is the patient awaiting placement in inpatient or outpatient setting: Yes.  Has the patient had a psychiatric consult: Yes.  Survey of unit performed for contraband, proper placement and condition of furniture, tampering with fixtures in bathroom, shower, and each patient room: Yes. ; Findings: All clear  APPEARANCE/BEHAVIOR  calm, cooperative and adequate rapport can be established  NEURO ASSESSMENT  Orientation: time, place and person  Hallucinations: No.None noted (Hallucinations)  Speech: Normal  Gait: normal  RESPIRATORY ASSESSMENT  WNL  CARDIOVASCULAR ASSESSMENT  WNL  GASTROINTESTINAL ASSESSMENT  WNL  EXTREMITIES  WNL  PLAN OF CARE  Provide calm/safe environment. Vital signs assessed TID. ED BHU Assessment once each 12-hour shift. Collaborate with TTS daily or as condition indicates. Assure the ED provider has rounded once each shift. Provide and encourage  hygiene. Provide redirection as needed. Assess for escalating behavior; address immediately and inform ED provider.  Assess family dynamic and appropriateness for visitation as needed: Yes. ; If necessary, describe findings:  Educate the patient/family about BHU procedures/visitation: Yes. ; If necessary, describe findings: Pt is calm and cooperative at this time. Pt understanding and accepting of unit procedures/rules. Will continue to monitor with Q 15 min safety rounds and observation.

## 2016-09-30 NOTE — ED Notes (Signed)
MD notified of pts blood sugar of 63. Pt given orange juice and pt has dinner tray at bedside.

## 2016-09-30 NOTE — ED Notes (Signed)
Pt blood glucose 221. Ann,RN notified about these results.

## 2016-09-30 NOTE — ED Notes (Signed)
Pt refused V[place]. Placed in pixis refrigerator. Will encourage pt to take med later this evening.

## 2016-09-30 NOTE — ED Notes (Signed)
Pt given warm blanket.

## 2016-09-30 NOTE — ED Notes (Signed)
Pt given rice crispy cereal and lowfat milk.

## 2016-09-30 NOTE — ED Notes (Signed)
Pt asleep at this time, pt would not answer this tech or get up to eat meal, per Rn request, this tech will keep food at tech desk until pt awakens

## 2016-09-30 NOTE — ED Notes (Signed)
BEHAVIORAL HEALTH ROUNDING Patient sleeping: Yes.   Patient alert and oriented: not applicable SLEEPING Behavior appropriate: Yes.  ; If no, describe: SLEEPING Nutrition and fluids offered: No SLEEPING Toileting and hygiene offered: NoSLEEPING Sitter present: not applicable, Q 15 min safety rounds and observation. Law enforcement present: Yes ODS 

## 2016-10-01 LAB — RENAL FUNCTION PANEL
ALBUMIN: 3.6 g/dL (ref 3.5–5.0)
ANION GAP: 15 (ref 5–15)
BUN: 102 mg/dL — ABNORMAL HIGH (ref 6–20)
CALCIUM: 9.4 mg/dL (ref 8.9–10.3)
CO2: 26 mmol/L (ref 22–32)
CREATININE: 9.47 mg/dL — AB (ref 0.44–1.00)
Chloride: 91 mmol/L — ABNORMAL LOW (ref 101–111)
GFR, EST AFRICAN AMERICAN: 6 mL/min — AB (ref >60)
GFR, EST NON AFRICAN AMERICAN: 5 mL/min — AB (ref >60)
Glucose, Bld: 275 mg/dL — ABNORMAL HIGH (ref 65–99)
PHOSPHORUS: 4.7 mg/dL — AB (ref 2.5–4.6)
Potassium: 7 mmol/L (ref 3.5–5.1)
SODIUM: 132 mmol/L — AB (ref 135–145)

## 2016-10-01 LAB — CBC
HCT: 28.4 % — ABNORMAL LOW (ref 35.0–47.0)
HEMOGLOBIN: 9.6 g/dL — AB (ref 12.0–16.0)
MCH: 31.4 pg (ref 26.0–34.0)
MCHC: 33.9 g/dL (ref 32.0–36.0)
MCV: 92.7 fL (ref 80.0–100.0)
PLATELETS: 174 10*3/uL (ref 150–440)
RBC: 3.07 MIL/uL — AB (ref 3.80–5.20)
RDW: 14.6 % — ABNORMAL HIGH (ref 11.5–14.5)
WBC: 4.5 10*3/uL (ref 3.6–11.0)

## 2016-10-01 LAB — GLUCOSE, CAPILLARY
GLUCOSE-CAPILLARY: 98 mg/dL (ref 65–99)
GLUCOSE-CAPILLARY: 73 mg/dL (ref 65–99)
Glucose-Capillary: 202 mg/dL — ABNORMAL HIGH (ref 65–99)

## 2016-10-01 MED ORDER — ESCITALOPRAM OXALATE 10 MG PO TABS: 15.0000 mg | ORAL_TABLET | Freq: Every day | ORAL | 1 refills | Status: AC

## 2016-10-01 MED ORDER — METOPROLOL TARTRATE 50 MG PO TABS: 50.0000 mg | ORAL_TABLET | Freq: Two times a day (BID) | ORAL | 1 refills | Status: DC

## 2016-10-01 MED ORDER — VITAMIN B-12 1000 MCG PO TABS: 1000.0000 ug | ORAL_TABLET | Freq: Every day | ORAL | 0 refills | Status: DC

## 2016-10-01 MED ORDER — THEREMS PO TABS: 1.0000 | ORAL_TABLET | Freq: Every day | ORAL | 0 refills | Status: DC

## 2016-10-01 MED ORDER — RENA-VITE RX 1 MG PO TABS: 1.0000 | ORAL_TABLET | Freq: Every day | ORAL | 0 refills | Status: AC

## 2016-10-01 MED ORDER — TRAZODONE HCL 100 MG PO TABS: 100.0000 mg | ORAL_TABLET | Freq: Every day | ORAL | 0 refills | Status: DC

## 2016-10-01 MED ORDER — ASPIRIN 81 MG PO CHEW: 81.0000 mg | CHEWABLE_TABLET | Freq: Every day | ORAL | 0 refills | Status: AC

## 2016-10-01 MED ORDER — CINACALCET HCL 60 MG PO TABS: 60.0000 mg | ORAL_TABLET | Freq: Every day | ORAL | 0 refills | Status: AC

## 2016-10-01 MED ORDER — DIVALPROEX SODIUM 500 MG PO DR TAB: 1000.0000 mg | DELAYED_RELEASE_TABLET | Freq: Every day | ORAL | 0 refills | Status: AC

## 2016-10-01 MED ORDER — CALCIUM ACETATE 667 MG PO CAPS: 1334.0000 mg | ORAL_CAPSULE | Freq: Two times a day (BID) | ORAL | 0 refills | Status: AC

## 2016-10-01 MED ORDER — IRBESARTAN 300 MG PO TABS: 300.0000 mg | ORAL_TABLET | Freq: Every day | ORAL | 0 refills | Status: DC

## 2016-10-01 MED ORDER — QUETIAPINE FUMARATE 200 MG PO TABS: 200.0000 mg | ORAL_TABLET | Freq: Two times a day (BID) | ORAL | 0 refills | Status: DC

## 2016-10-01 MED ORDER — LIDOCAINE-PRILOCAINE 2.5-2.5 % EX CREA: 1.0000 "application " | TOPICAL_CREAM | CUTANEOUS | 0 refills | Status: AC

## 2016-10-01 MED ORDER — HYDRALAZINE HCL 25 MG PO TABS: 25.0000 mg | ORAL_TABLET | Freq: Three times a day (TID) | ORAL | 0 refills | Status: DC

## 2016-10-01 MED ORDER — INSULIN ASPART 100 UNIT/ML ~~LOC~~ SOLN: 0.0000 [IU] | Freq: Three times a day (TID) | SUBCUTANEOUS | 1 refills | Status: DC

## 2016-10-01 MED ORDER — FOLIC ACID 1 MG PO TABS: 2.0000 mg | ORAL_TABLET | Freq: Every day | ORAL | 0 refills | Status: AC

## 2016-10-01 MED ORDER — AMLODIPINE BESYLATE 10 MG PO TABS: 10.0000 mg | ORAL_TABLET | Freq: Every evening | ORAL | 0 refills | Status: DC

## 2016-10-01 MED ORDER — CLONIDINE HCL 0.1 MG PO TABS: 0.1000 mg | ORAL_TABLET | Freq: Two times a day (BID) | ORAL | 0 refills | Status: DC

## 2016-10-01 MED ORDER — DOCUSATE SODIUM 100 MG PO CAPS: 100.0000 mg | ORAL_CAPSULE | Freq: Every day | ORAL | 0 refills | Status: AC

## 2016-10-01 MED ORDER — ACETAMINOPHEN 325 MG PO TABS: 650.0000 mg | ORAL_TABLET | Freq: Four times a day (QID) | ORAL | 0 refills | Status: DC | PRN

## 2016-10-01 MED ORDER — PATIROMER SORBITEX CALCIUM 8.4 G PO PACK: 8.4000 g | PACK | ORAL | 0 refills | Status: AC

## 2016-10-01 NOTE — Progress Notes (Signed)
Dialysis complete

## 2016-10-01 NOTE — Progress Notes (Signed)
Pre Dialysis 

## 2016-10-01 NOTE — ED Notes (Signed)
Pt refused shower for the second time today. Nellie RN aware of this.

## 2016-10-01 NOTE — ED Notes (Signed)
BEHAVIORAL HEALTH ROUNDING Patient sleeping: Yes.   Patient alert and oriented: not applicable SLEEPING Behavior appropriate: Yes.  ; If no, describe: SLEEPING Nutrition and fluids offered: No SLEEPING Toileting and hygiene offered: NoSLEEPING Sitter present: not applicable, Q 15 min safety rounds and observation. Law enforcement present: Yes ODS 

## 2016-10-01 NOTE — ED Notes (Signed)
IVC PAPERS  RESCINDED  PER  DR  CLAPACS PENDING  D/C IN  THE  AM

## 2016-10-01 NOTE — ED Notes (Signed)
Pt at dialysis

## 2016-10-01 NOTE — Progress Notes (Signed)
Pt is expected to discharge tomorrow 11/7 at 11am. [facility] are in agreement with this plan and will be here to pick the pt up.   Pt will receive dialysis at [facility] in [place] on a MWF schedule starting Wednesday 11/8 at 3pm.   Due to reoccurring changes to d/c plan resulting in pt disappointment and anxiety, please DO NOT inform pt of potential discharge until it is finalized.  CSW will continue to follow pt and assist as needed.  [name], MSW, [place]

## 2016-10-01 NOTE — ED Notes (Signed)
Pt at dialysis during hourly rounding.

## 2016-10-01 NOTE — ED Notes (Signed)
Recalled dining [phone] about nepro. Dining said they would check on it.

## 2016-10-01 NOTE — ED Provider Notes (Signed)
-----------------------------------------   6:28 AM on 10/01/2016 -----------------------------------------   Blood pressure (!) 156/120, pulse 87, temperature 98.7 F (37.1 C), temperature source Oral, resp. rate 18, height 5\' 2"  (1.575 m), weight 181 lb 10.5 oz (82.4 kg), SpO2 98 %.  The patient had no acute events since last update.  Calm and cooperative at this time.  Should be going to dialysis today. Disposition is pending CSW team recommendations.     Paulette Blanch, MD 10/01/16 [phone]

## 2016-10-01 NOTE — ED Notes (Addendum)
Pt showered, clothing changed

## 2016-10-01 NOTE — ED Notes (Signed)
Patient stated that she did not want to take a shower this A.M.

## 2016-10-01 NOTE — ED Notes (Signed)
CBG collected for documentation only, not preformed.

## 2016-10-01 NOTE — ED Notes (Signed)
Patient assigned to appropriate care area. Patient oriented to unit/care area: Informed that, for their safety, care areas are designed for safety and monitored by security cameras at all times; and visiting hours explained to patient. Patient verbalizes understanding, and verbal contract for safety obtained. 

## 2016-10-01 NOTE — Progress Notes (Signed)
Post dialysis 

## 2016-10-01 NOTE — ED Notes (Signed)
Pt returned from dialysis

## 2016-10-01 NOTE — Progress Notes (Signed)
Subjective:  Patient seen and evaluated during hemodialysis treatment. Patient still has periods of hypokalemia and most recent serum potassium was 6.2. Hopefully she will have definitive disposition this week.    HEMODIALYSIS FLOWSHEET:  Blood Flow Rate (mL/min): 400 mL/min Arterial Pressure (mmHg): -150 mmHg Venous Pressure (mmHg): 190 mmHg Transmembrane Pressure (mmHg): 70 mmHg Ultrafiltration Rate (mL/min): 630 mL/min Dialysate Flow Rate (mL/min): 800 ml/min Conductivity: Machine : 13.5 Conductivity: Machine : 13.5 Dialysis Fluid Bolus: Normal Saline Bolus Amount (mL): 250 mL Dialysate Change: 2K Intra-Hemodialysis Comments: 250. Resting   Objective:  Vital signs in last 24 hours:  Temp:  [98.2 F (36.8 C)-98.7 F (37.1 C)] 98.2 F (36.8 C) (11/06 1030) Pulse Rate:  [80-93] 93 (11/06 1100) Resp:  [13-21] 20 (11/06 1100) BP: (125-156)/(63-120) 127/77 (11/06 1100) SpO2:  [98 %-100 %] 100 % (11/06 1100) Weight:  [82.7 kg (182 lb 5.1 oz)] 82.7 kg (182 lb 5.1 oz) (11/06 1030)  Weight change:  Filed Weights   09/28/16 0825 09/28/16 1230 10/01/16 1030  Weight: 85.1 kg (187 lb 9.8 oz) 82.4 kg (181 lb 10.5 oz) 82.7 kg (182 lb 5.1 oz)    Intake/Output:   No intake or output data in the 24 hours ending 10/01/16 1121   Physical Exam: General: No acute distress  HEENT [place]/AT hearing intact  Neck Supple   Pulm/lungs Normal breathing effort, clear to auscultation   CVS/Heart Regular rhythm, no rub or gallop, Systolic murmur present  Abdomen:  Soft, nontender, nondistended   Extremities: No peripheral edema   Neurologic: Awake, alert, following commands  Skin: No acute rashes   Access: Left upper arm AVG       Basic Metabolic Panel:   Recent Labs Lab 09/26/16 1010 09/28/16 0830 09/30/16 0723  NA 134* 135 134*  K 5.4* 6.1* 6.2*  CL 92* 94* 93*  CO2 27 27 31   GLUCOSE 232* 117* 106*  BUN 109* 87* 71*  CREATININE 8.84* 8.17* 7.51*  CALCIUM 9.6 9.9 9.5  PHOS  4.2 4.4  --      CBC:  Recent Labs Lab 09/28/16 0830 10/01/16 1035  WBC 4.6 4.5  HGB 9.9* 9.6*  HCT 28.9* 28.4*  MCV 93.8 92.7  PLT 173 174      Microbiology:  No results found for this or any previous visit (from the past 720 hour(s)).  Coagulation Studies: No results for input(s): LABPROT, INR in the last 72 hours.  Urinalysis: No results for input(s): COLORURINE, LABSPEC, PHURINE, GLUCOSEU, HGBUR, BILIRUBINUR, KETONESUR, PROTEINUR, UROBILINOGEN, NITRITE, LEUKOCYTESUR in the last 72 hours.  Invalid input(s): APPERANCEUR    Imaging: No results found.   Medications:   .  ceFAZolin (ANCEF) IV Stopped (09/13/16 1811)   . acetaminophen  650 mg Oral Once  . amLODipine  10 mg Oral QPM  . aspirin  81 mg Oral Daily  . cholecalciferol  800 Units Oral Daily  . cloNIDine  0.1 mg Oral BID  . divalproex  1,000 mg Oral QHS  . epoetin (EPOGEN/PROCRIT) injection  4,000 Units Intravenous Q M,W,F-HD  . escitalopram  15 mg Oral Daily  . feeding supplement (NEPRO CARB STEADY)  237 mL Oral BID BM  . hydrALAZINE  25 mg Oral TID  . insulin aspart  0-15 Units Subcutaneous TID WC  . insulin aspart  0-5 Units Subcutaneous QHS  . irbesartan  300 mg Oral QHS  . metoprolol tartrate  50 mg Oral BID  . patiromer  8.4 g Oral Q24H  . QUEtiapine  200 mg Oral BID  . traZODone  100 mg Oral QHS   acetaminophen, heparin  Assessment/ Plan:  35 y.o. African-American female with developmental delay, ESRD, AOCD, SHPTH, HTN, schizophrenia, mild cognitive impairment, diabetes mellitus type II  [facility] MWF [address]  1. Hyperkalemia:  Patient seen and evaluated during dialysis treatment. Continue to monitor serum potassium periodically. Also continue patiromer.   2. Hypertension:  - Blood pressure 127/77 during dialysis. Continue Amlodipine, clonidine, hydralazine, irbesartan, metoprolol  3. Anemia of chronic kidney disease  - Hemoglobin close to target at 9.6. Continue Epogen  with dialysis.  4. Secondary Hyperparathyroidism: Patient has not required Binder therapy. Continue to monitor phosphorus and calcium periodically.  5. End Stage Renal Disease: on hemodialysis MWF schedule. Patient seen and evaluated during dialysis treatment. Complete dialysis treatment today and reassess for dialysis on Wednesday.  6. DM-2 with CKD Insulin dependent    LOS: 0 Collen Vincent 11/6/201711:21 AM

## 2016-10-01 NOTE — Progress Notes (Signed)
Dialysis started 

## 2016-10-02 LAB — GLUCOSE, CAPILLARY: GLUCOSE-CAPILLARY: 119 mg/dL — AB (ref 65–99)

## 2016-10-02 NOTE — ED Notes (Signed)
Patient observed lying in bed with eyes closed  Even, unlabored respirations observed   NAD pt appears to be sleeping  I will continue to monitor along with every 15 minute visual observations and ongoing security monitoring    

## 2016-10-02 NOTE — ED Provider Notes (Addendum)
-----------------------------------------   4:02 PM on 06/09/2016 -----------------------------------------  Reportedly the group home wants to bring her back to the ED because her Avapro is not covered under Medicaid.  I called the pharmacist again and verified that losartan (Cozaar) 50 mg daily would be the appropriate turning negative for her Avapro 300 mg and that it is covered under Medicaid.  I informed Jeani Hawking the Education officer, museum said that she could call the group home and tell them that it has been handled and that there is absolutely no benefit to bringing [place] back to the emergency department as we can work out whatever medication issues there are by phone.  She said that she would call and let them know.  ----------------------------------------- 3:36 PM on 06/09/2016 -----------------------------------------  I was informed that the group home needed clarification about the insulin sliding scale.  Via [employer], we sent the sliding scale that Ms. Kuper has been on for the last 3 months in the emergency department.  She apparently already has the insulin (NovoLog).  Additionally, she did not have any of the insulin administration equipment or testing supplies.  I called her pharmacy ([facility]) at [phone] and spoke with pharmacist Jeani Hawking.  I provided verbal authorization for all the appropriate testing and administration supplies.  She will have it available later today.  I then updated Jeani Hawking the social worker so that she could pass it along to the patient's group home.   Hinda Kehr, MD 10/02/16 1539    Hinda Kehr, MD 10/02/16 [phone]

## 2016-10-02 NOTE — Progress Notes (Signed)
CSW called [name] at [phone]. Mr. Oswaldo Milian did not answer and his voicemail box has not been set up yet.  CSW called Mrs. Watlington at [phone]. CSW informed Mrs. Watlington that Dr. Karma Greaser has called in the orders for pt's medications and glucose equipment to [facility]. CSW also informed Ms. Watlington that pt's blood pressure medication has been changed to a medication that will be covered through her insurance. CSW emphasized the fact that pt's medication needs could be taken care of via phone correspondence and there is no need to bring pt back to the ED. CSW encouraged Ms. Watlington to call the ED CSW phone with other questions or concerns before deciding to bring pt back to the ED. Mrs. Watlington expressed her understanding and states that she will relay the message to [name].  [name], MSW, [place]

## 2016-10-02 NOTE — ED Notes (Signed)

## 2016-10-02 NOTE — ED Notes (Signed)
Patient in shower 

## 2016-10-02 NOTE — Progress Notes (Signed)
CSW participated in a conference call with pt's care coordinator [facility]) and group home owner [name]. Mr. Oswaldo Milian confirms that he and Mrs. Watlington will be at the ED around 11am to pick up the pt. CSW explained that pt's prescriptions and paperwork will be ready for them when they arrive.   CSW called [employer] at [phone]. Jackelyn Poling confirms that everything is ready for pt to start receiving dialysis tomorrow.  Pt will be discharging TODAY at 11am to [facility] in [place], [place]. Pt will receive dialysis at [facility] on a MWF schedule starting tomorrow 11/8 at 3pm.  Pt's guardian Porfirio Mylar ([employer]) is aware of above and agreeable with the plan.  [name], MSW, [place]

## 2016-10-02 NOTE — ED Notes (Signed)
BEHAVIORAL HEALTH ROUNDING Patient sleeping: No. Patient alert and oriented: yes Behavior appropriate: Yes.  ; If no, describe:  Nutrition and fluids offered: yes Toileting and hygiene offered: Yes  Sitter present: q15 minute observations and security  monitoring Law enforcement present: Yes  ODS  

## 2016-10-02 NOTE — ED Notes (Signed)
ED BHU [place] Is the patient under IVC or is there intent for IVC:  No   Is the patient medically cleared: Yes.   Is there vacancy in the ED BHU: Yes.   Is the population mix appropriate for patient: Yes.   Is the patient awaiting placement in inpatient or outpatient setting:  Group home placement with HD access  Has the patient had a psychiatric consult: Yes.   Survey of unit performed for contraband, proper placement and condition of furniture, tampering with fixtures in bathroom, shower, and each patient room: Yes.  ; Findings:  APPEARANCE/BEHAVIOR Calm and cooperative NEURO ASSESSMENT Orientation: oriented x4  Denies pain Hallucinations: No.None noted (Hallucinations) Speech: Normal Gait: normal RESPIRATORY ASSESSMENT Even  Unlabored respirations  CARDIOVASCULAR ASSESSMENT Pulses equal   regular rate  Skin warm and dry   GASTROINTESTINAL ASSESSMENT no GI complaint EXTREMITIES Full ROM  PLAN OF CARE Provide calm/safe environment. Vital signs assessed twice daily. ED BHU Assessment once each 12-hour shift. Collaborate with  TTS daily or as condition indicates. Assure the ED provider has rounded once each shift. Provide and encourage hygiene. Provide redirection as needed. Assess for escalating behavior; address immediately and inform ED provider.  Assess family dynamic and appropriateness for visitation as needed: Yes.  ; If necessary, describe findings:  Educate the patient/family about BHU procedures/visitation: Yes.  ; If necessary, describe findings:

## 2016-10-02 NOTE — ED Provider Notes (Signed)
Patient for discharge today. Well appearing with stable vitals. Received dialysis yesterday.   Kelly Drafts, MD 10/02/16 [phone]

## 2016-10-22 ENCOUNTER — Emergency Department
Admission: EM | Admit: 2016-10-22 | Discharge: 2016-10-22 | Disposition: A | Discharge status: Home / Self Care | Payer: Medicaid Other | Attending: Emergency Medicine | Admitting: Emergency Medicine

## 2016-10-22 ENCOUNTER — Encounter: Payer: Self-pay | Admitting: Emergency Medicine

## 2016-10-22 DIAGNOSIS — S0990XA Unspecified injury of head, initial encounter: Secondary | ICD-10-CM

## 2016-10-22 DIAGNOSIS — Y939 Activity, unspecified: Secondary | ICD-10-CM | POA: Diagnosis not present

## 2016-10-22 DIAGNOSIS — E1122 Type 2 diabetes mellitus with diabetic chronic kidney disease: Secondary | ICD-10-CM | POA: Insufficient documentation

## 2016-10-22 DIAGNOSIS — N186 End stage renal disease: Secondary | ICD-10-CM | POA: Insufficient documentation

## 2016-10-22 DIAGNOSIS — Y929 Unspecified place or not applicable: Secondary | ICD-10-CM | POA: Insufficient documentation

## 2016-10-22 DIAGNOSIS — I120 Hypertensive chronic kidney disease with stage 5 chronic kidney disease or end stage renal disease: Secondary | ICD-10-CM | POA: Insufficient documentation

## 2016-10-22 DIAGNOSIS — Y999 Unspecified external cause status: Secondary | ICD-10-CM | POA: Diagnosis not present

## 2016-10-22 DIAGNOSIS — S0003XA Contusion of scalp, initial encounter: Secondary | ICD-10-CM | POA: Insufficient documentation

## 2016-10-22 DIAGNOSIS — Z794 Long term (current) use of insulin: Secondary | ICD-10-CM | POA: Insufficient documentation

## 2016-10-22 DIAGNOSIS — W102XXA Fall (on)(from) incline, initial encounter: Secondary | ICD-10-CM | POA: Insufficient documentation

## 2016-10-22 DIAGNOSIS — Z7982 Long term (current) use of aspirin: Secondary | ICD-10-CM | POA: Insufficient documentation

## 2016-10-22 DIAGNOSIS — F17210 Nicotine dependence, cigarettes, uncomplicated: Secondary | ICD-10-CM | POA: Insufficient documentation

## 2016-10-22 DIAGNOSIS — F17290 Nicotine dependence, other tobacco product, uncomplicated: Secondary | ICD-10-CM | POA: Diagnosis not present

## 2016-10-22 DIAGNOSIS — Z79899 Other long term (current) drug therapy: Secondary | ICD-10-CM | POA: Insufficient documentation

## 2016-10-22 NOTE — ED Triage Notes (Signed)
States she missed a step  And fell backwards

## 2016-10-22 NOTE — ED Triage Notes (Signed)
Patient states she was coming out of dialysis and missed a step. Fell backwards and hit back of head. Patient denies dizziness or LOC at any time. Patient states swollen area to back of head hurts, but denies any other pain. Patient ambulatory to triage without difficulty.
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
## 2016-10-22 NOTE — ED Notes (Signed)
Pt reports that she fell going up the ramp at dialysis and fell backwards hitting head - pt states she has a knot on her head and that is causing her to have a headache - denies nausea/vomiting - denies LOC

## 2016-11-27 ENCOUNTER — Emergency Department
Admission: EM | Admit: 2016-11-27 | Discharge: 2016-11-27 | Disposition: A | Discharge status: Home / Self Care | Payer: Medicaid Other | Attending: Emergency Medicine | Admitting: Emergency Medicine

## 2016-11-27 ENCOUNTER — Encounter: Payer: Self-pay | Admitting: *Deleted

## 2016-11-27 DIAGNOSIS — N186 End stage renal disease: Secondary | ICD-10-CM | POA: Diagnosis not present

## 2016-11-27 DIAGNOSIS — Z794 Long term (current) use of insulin: Secondary | ICD-10-CM | POA: Insufficient documentation

## 2016-11-27 DIAGNOSIS — I120 Hypertensive chronic kidney disease with stage 5 chronic kidney disease or end stage renal disease: Secondary | ICD-10-CM | POA: Insufficient documentation

## 2016-11-27 DIAGNOSIS — E1122 Type 2 diabetes mellitus with diabetic chronic kidney disease: Secondary | ICD-10-CM | POA: Diagnosis not present

## 2016-11-27 DIAGNOSIS — Z7982 Long term (current) use of aspirin: Secondary | ICD-10-CM | POA: Insufficient documentation

## 2016-11-27 DIAGNOSIS — Z79899 Other long term (current) drug therapy: Secondary | ICD-10-CM | POA: Insufficient documentation

## 2016-11-27 DIAGNOSIS — Y999 Unspecified external cause status: Secondary | ICD-10-CM | POA: Insufficient documentation

## 2016-11-27 DIAGNOSIS — Y9389 Activity, other specified: Secondary | ICD-10-CM | POA: Insufficient documentation

## 2016-11-27 DIAGNOSIS — F17210 Nicotine dependence, cigarettes, uncomplicated: Secondary | ICD-10-CM | POA: Insufficient documentation

## 2016-11-27 DIAGNOSIS — T2005XA Burn of unspecified degree of scalp [any part], initial encounter: Secondary | ICD-10-CM | POA: Diagnosis present

## 2016-11-27 DIAGNOSIS — Z992 Dependence on renal dialysis: Secondary | ICD-10-CM | POA: Diagnosis not present

## 2016-11-27 DIAGNOSIS — T2015XA Burn of first degree of scalp [any part], initial encounter: Secondary | ICD-10-CM

## 2016-11-27 DIAGNOSIS — Y92009 Unspecified place in unspecified non-institutional (private) residence as the place of occurrence of the external cause: Secondary | ICD-10-CM | POA: Diagnosis not present

## 2016-11-27 DIAGNOSIS — X088XXA Exposure to other specified smoke, fire and flames, initial encounter: Secondary | ICD-10-CM | POA: Insufficient documentation

## 2016-11-27 MED ORDER — BACITRACIN ZINC 500 UNIT/GM EX OINT
TOPICAL_OINTMENT | Freq: Two times a day (BID) | CUTANEOUS | Status: DC
  Administered 2016-11-27: 1 via TOPICAL
  Filled 2016-11-27: qty 0.9

## 2016-11-27 MED ORDER — SULFAMETHOXAZOLE-TRIMETHOPRIM 800-160 MG PO TABS: 1.0000 | ORAL_TABLET | Freq: Two times a day (BID) | ORAL | 0 refills | Status: DC

## 2016-11-27 MED ORDER — BACITRACIN ZINC 500 UNIT/GM EX OINT: TOPICAL_OINTMENT | CUTANEOUS | 0 refills | Status: AC
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
## 2016-12-20 IMAGING — CR DG CHEST 2V
2 series · 2 of 2 positions shown · non-contrast
Comparison: 04/19/2015 and earlier.

CLINICAL DATA: 35-year-old female with intermittent shortness of
breath. Diabetes, hypertension, chronic kidney disease. Initial
encounter.

EXAM:
CHEST  2 VIEW

[chest pa]
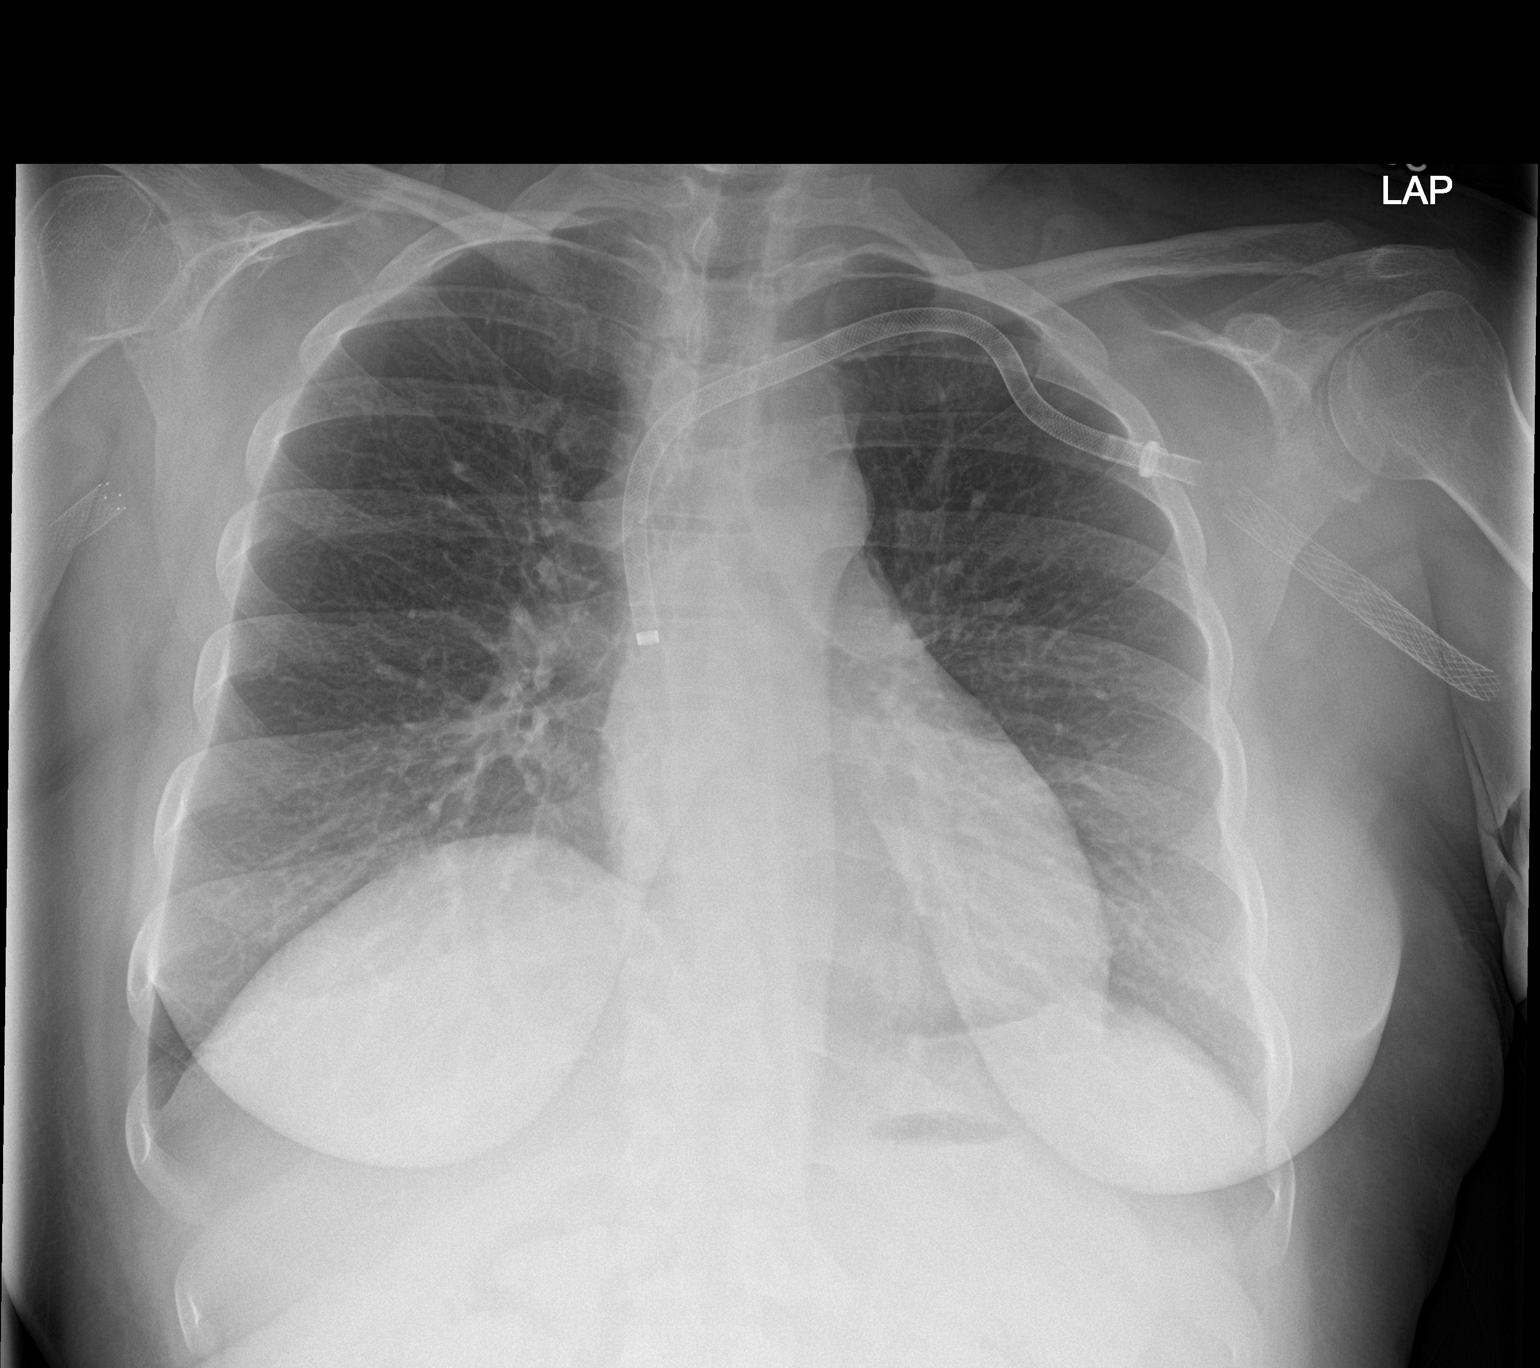

[chest lat]
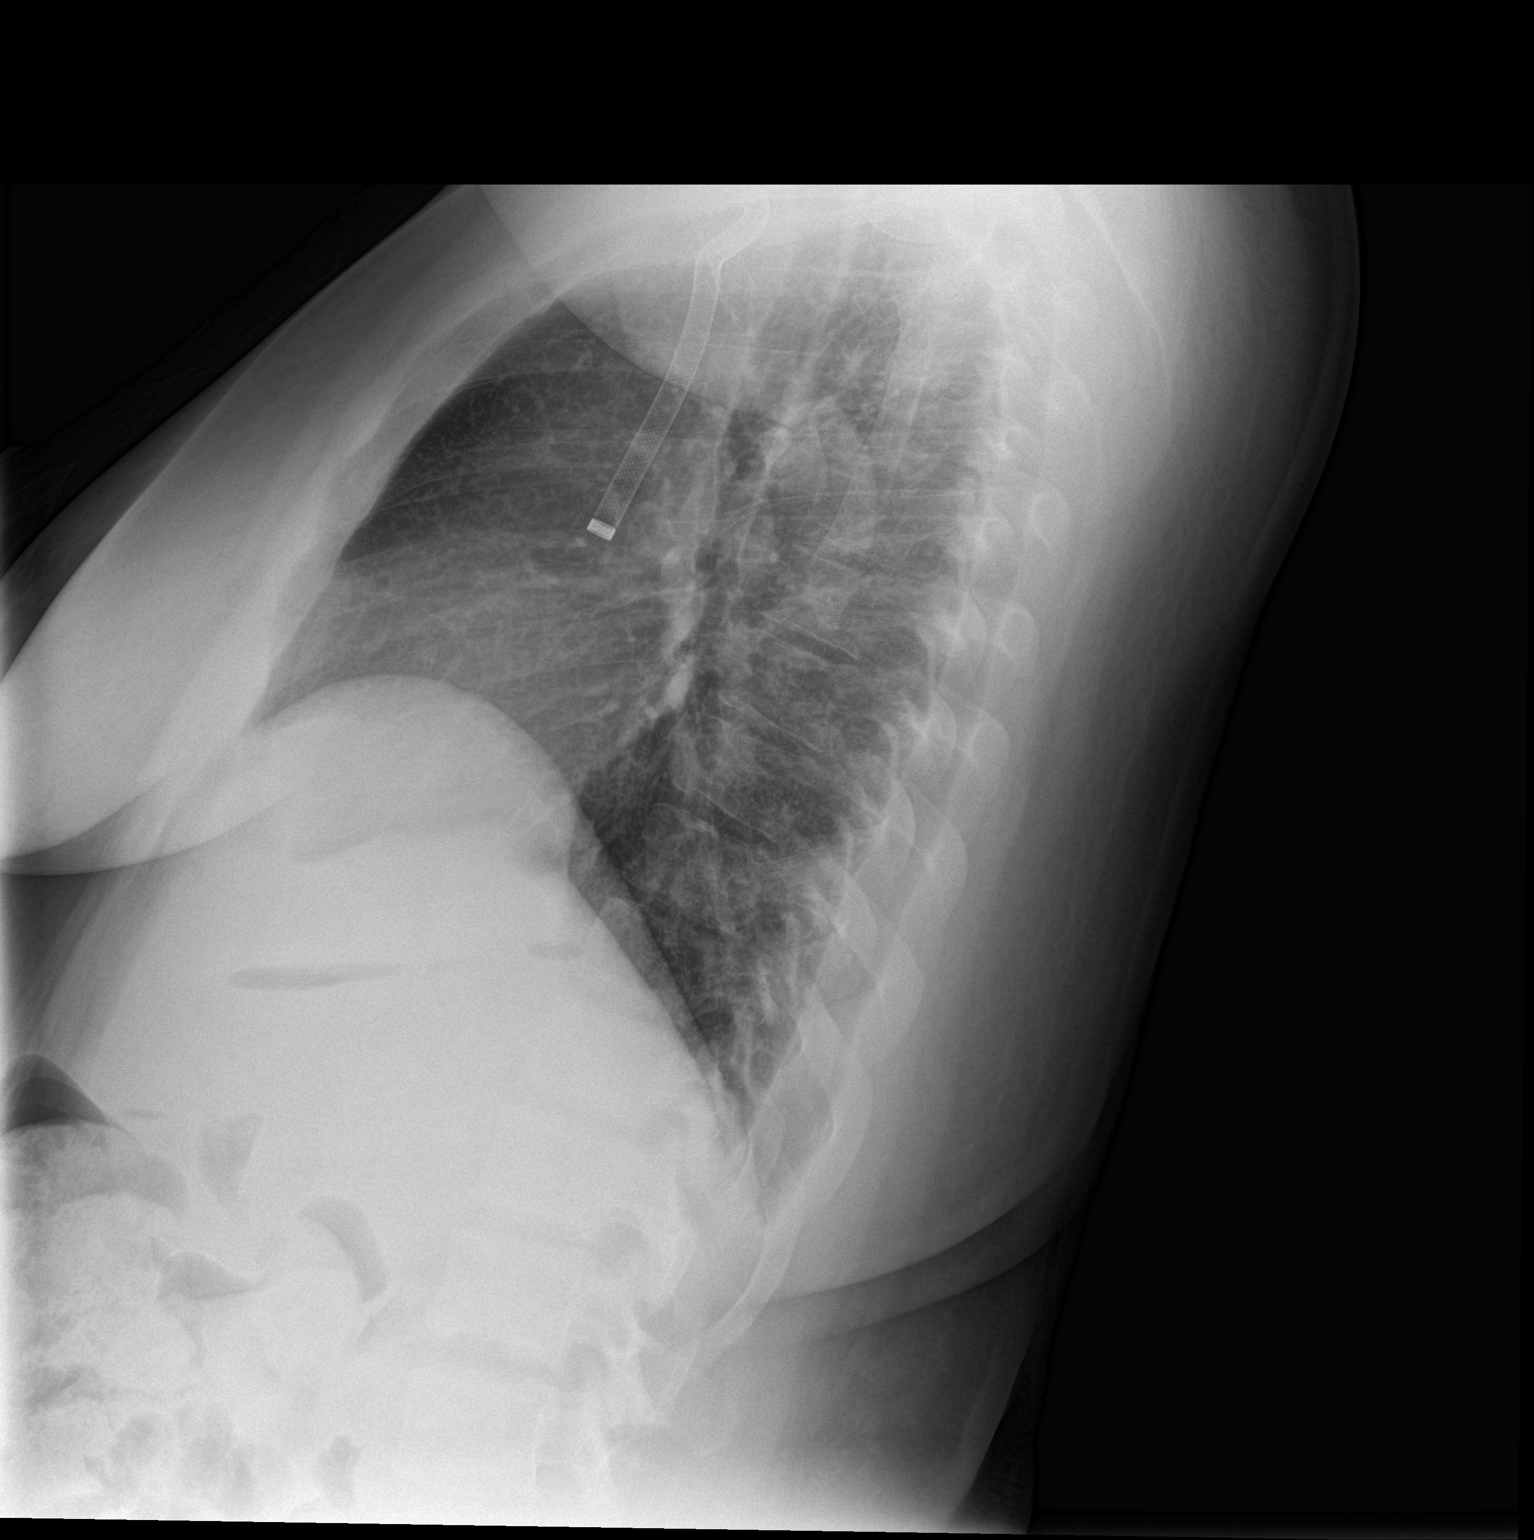

[2 of 2 positions shown; findings below may reference images not displayed]

FINDINGS: Previously seen right chest dual lumen dialysis catheter has been
removed. There is a new left subclavian to SVC region graft in
place. Chronic bilateral axillary vascular stents. Mediastinal
contours remain normal. Mildly lower lung volumes. Mild elevation of
the right hemidiaphragm not significantly changed. No pneumothorax
or pulmonary edema. No pleural effusion or acute pulmonary opacity.
No acute osseous abnormality identified.
IMPRESSION: 1.  No acute cardiopulmonary abnormality.
2. Revision of dialysis access since [DATE].

## 2016-12-23 ENCOUNTER — Encounter (HOSPITAL_COMMUNITY): Payer: Self-pay | Admitting: Emergency Medicine

## 2016-12-23 ENCOUNTER — Emergency Department (HOSPITAL_COMMUNITY)
Admission: EM | Admit: 2016-12-23 | Discharge: 2016-12-23 | Disposition: A | Discharge status: Home / Self Care | Payer: Medicaid Other | Attending: Emergency Medicine | Admitting: Emergency Medicine

## 2016-12-23 DIAGNOSIS — Z992 Dependence on renal dialysis: Secondary | ICD-10-CM | POA: Diagnosis not present

## 2016-12-23 DIAGNOSIS — Z79899 Other long term (current) drug therapy: Secondary | ICD-10-CM | POA: Diagnosis not present

## 2016-12-23 DIAGNOSIS — I120 Hypertensive chronic kidney disease with stage 5 chronic kidney disease or end stage renal disease: Secondary | ICD-10-CM | POA: Insufficient documentation

## 2016-12-23 DIAGNOSIS — F17210 Nicotine dependence, cigarettes, uncomplicated: Secondary | ICD-10-CM | POA: Insufficient documentation

## 2016-12-23 DIAGNOSIS — N186 End stage renal disease: Secondary | ICD-10-CM | POA: Insufficient documentation

## 2016-12-23 DIAGNOSIS — Z7982 Long term (current) use of aspirin: Secondary | ICD-10-CM | POA: Insufficient documentation

## 2016-12-23 DIAGNOSIS — Z794 Long term (current) use of insulin: Secondary | ICD-10-CM | POA: Insufficient documentation

## 2016-12-23 DIAGNOSIS — E1122 Type 2 diabetes mellitus with diabetic chronic kidney disease: Secondary | ICD-10-CM | POA: Diagnosis not present

## 2016-12-23 DIAGNOSIS — R030 Elevated blood-pressure reading, without diagnosis of hypertension: Secondary | ICD-10-CM | POA: Diagnosis present

## 2016-12-23 LAB — COMPREHENSIVE METABOLIC PANEL
ALT: 9 U/L — ABNORMAL LOW (ref 14–54)
ANION GAP: 16 — AB (ref 5–15)
AST: 18 U/L (ref 15–41)
Albumin: 3.4 g/dL — ABNORMAL LOW (ref 3.5–5.0)
Alkaline Phosphatase: 84 U/L (ref 38–126)
BUN: 77 mg/dL — ABNORMAL HIGH (ref 6–20)
CHLORIDE: 95 mmol/L — AB (ref 101–111)
CO2: 26 mmol/L (ref 22–32)
Calcium: 9.5 mg/dL (ref 8.9–10.3)
Creatinine, Ser: 10.97 mg/dL — ABNORMAL HIGH (ref 0.44–1.00)
GFR, EST AFRICAN AMERICAN: 5 mL/min — AB (ref >60)
GFR, EST NON AFRICAN AMERICAN: 4 mL/min — AB (ref >60)
Glucose, Bld: 147 mg/dL — ABNORMAL HIGH (ref 65–99)
Potassium: 5.9 mmol/L — ABNORMAL HIGH (ref 3.5–5.1)
SODIUM: 137 mmol/L (ref 135–145)
Total Bilirubin: 0.7 mg/dL (ref 0.3–1.2)
Total Protein: 7 g/dL (ref 6.5–8.1)

## 2016-12-23 LAB — CBC WITH DIFFERENTIAL/PLATELET
Basophils Absolute: 0.1 10*3/uL (ref 0.0–0.1)
Basophils Relative: 1 %
Eosinophils Absolute: 0.2 10*3/uL (ref 0.0–0.7)
Eosinophils Relative: 4 %
HEMATOCRIT: 27.3 % — AB (ref 36.0–46.0)
HEMOGLOBIN: 7.8 g/dL — AB (ref 12.0–15.0)
LYMPHS ABS: 2.1 10*3/uL (ref 0.7–4.0)
Lymphocytes Relative: 43 %
MCH: 28.3 pg (ref 26.0–34.0)
MCHC: 28.6 g/dL — AB (ref 30.0–36.0)
MCV: 98.9 fL (ref 78.0–100.0)
MONOS PCT: 8 %
Monocytes Absolute: 0.4 10*3/uL (ref 0.1–1.0)
NEUTROS ABS: 2.1 10*3/uL (ref 1.7–7.7)
NEUTROS PCT: 44 %
Platelets: 120 10*3/uL — ABNORMAL LOW (ref 150–400)
RBC: 2.76 MIL/uL — AB (ref 3.87–5.11)
RDW: 15.6 % — ABNORMAL HIGH (ref 11.5–15.5)
WBC: 4.8 10*3/uL (ref 4.0–10.5)

## 2016-12-23 LAB — I-STAT BETA HCG BLOOD, ED (MC, WL, AP ONLY)

## 2016-12-23 LAB — LIPASE, BLOOD: Lipase: 77 U/L — ABNORMAL HIGH (ref 11–51)

## 2016-12-23 MED ORDER — ACETAMINOPHEN 325 MG PO TABS
325.0000 mg | ORAL_TABLET | Freq: Once | ORAL | Status: AC
  Administered 2016-12-23: 325 mg via ORAL
  Filled 2016-12-23: qty 1

## 2016-12-23 MED ORDER — LORAZEPAM 2 MG/ML IJ SOLN: 0.5000 mg | Freq: Once | INTRAMUSCULAR | Status: DC

## 2016-12-23 MED ORDER — ONDANSETRON HCL 4 MG/2ML IJ SOLN: 4.0000 mg | Freq: Once | INTRAMUSCULAR | Status: DC

## 2016-12-23 NOTE — ED Provider Notes (Signed)
[facility] DEPT Provider Note   CSN: [id number] Arrival date & time: 12/23/16  1105     History   Chief Complaint Chief Complaint  Patient presents with  . Hypertension  . Back Pain  . Abdominal Pain    HPI Kelly [name] is a 36 y.o. female who presents with multiple complaints. PMHx significant for ESRD on dialysis M, W, F (last dialyzed Friday), hx of alcohol abuse, schizophrenia, bipolar d/o. History is limited due to psych disorder. A staff member from the group home where she stays is at bedside and provides some history. He states that they checked her BP today and it was ~160/110. They gave her her blood pressure medicine and checked it again and it remained high therefore they brought her to the ED. Here the patient reports a headache, low back pain, abdominal pain, and bilateral foot pain. She states the back pain is the worst pain out of all her complaints. She states headache is mild and similar to previous headaches. It started when she got to the ED. Her back pain is also similar to pain she has had in the past. She is ambulatory and denies bowel incontinence (she is anuric). Her abdominal pain is also mild and generalized. She denies any current alcohol use. The pain in her feet is chronic and only when she walks. She has not taken anything for pain but does have Tylenol ordered prn at the group home. The staff member states she has not gotten anything because she did not have any complaints until she got here.    HPI  Past Medical History:  Diagnosis Date  . Alcohol abuse    chronic  . Bipolar disorder ([place])   . Chronic kidney disease   . Cognitive changes   . Cognitive impairment   . Depression   . Depression   . Diabetes mellitus without complication ([place])   . Elevated lipids   . Hyperparathyroidism ([place])   . Hypertension   . Staph aureus infection    A/V fistula  . UTI (lower urinary tract infection)     Patient Active Problem List   Diagnosis Date  Noted  . 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 Hyperkalemia 06/25/2016  . Adjustment disorder with mixed disturbance of emotions and conduct   . Diabetes ([place]) 04/23/2015  . Essential hypertension 04/23/2015  . ESRD on dialysis ([place]) 04/23/2015  . Pain of left arm 04/22/2015    Past Surgical History:  Procedure Laterality Date  . AV FISTULA PLACEMENT    . CHOLECYSTECTOMY    . dialysis perma cath Right    chest  . PERIPHERAL VASCULAR CATHETERIZATION N/A 05/24/2015   Procedure: Dialysis/Perma Catheter Removal;  Surgeon: Katha Cabal, MD;  Location: [facility];  Service: Cardiovascular;  Laterality: N/A;  . PERIPHERAL VASCULAR CATHETERIZATION Left 02/28/2016   Procedure: A/V Shuntogram/Fistulagram;  Surgeon: Katha Cabal, MD;  Location: [facility];  Service: Cardiovascular;  Laterality: Left;  . PERIPHERAL VASCULAR CATHETERIZATION N/A 02/28/2016   Procedure: A/V Shunt Intervention;  Surgeon: Katha Cabal, MD;  Location: [facility];  Service: Cardiovascular;  Laterality: N/A;  . PERIPHERAL VASCULAR CATHETERIZATION N/A 06/26/2016   Procedure: A/V Shuntogram/Fistulagram;  Surgeon: Katha Cabal, MD;  Location: [facility];  Service: Cardiovascular;  Laterality: N/A;  . PERIPHERAL VASCULAR CATHETERIZATION N/A 09/10/2016   Procedure: A/V Shuntogram/Fistulagram;  Surgeon: Algernon Huxley, MD;  Location: [facility];  Service: Cardiovascular;  Laterality:  N/A;  . PERIPHERAL VASCULAR CATHETERIZATION Left 09/14/2016   Procedure: Thrombectomy;  Surgeon: Katha Cabal, MD;  Location: [facility];  Service: Cardiovascular;  Laterality: Left;  Marland Kitchen VASCULAR ACCESS DEVICE INSERTION Left 04/22/2015   Procedure: INSERTION OF HERO VASCULAR ACCESS DEVICE;  Surgeon: Katha Cabal, MD;  Location: [facility];  Service: Vascular;  Laterality: Left;    OB History    No data available       Home Medications    Prior to Admission  medications   Medication Sig Start Date End Date Taking? Authorizing Provider  acetaminophen (TYLENOL) 325 MG tablet Take 2 tablets (650 mg total) by mouth every 6 (six) hours as needed for mild pain, moderate pain or headache. 10/01/16   Gonzella Lex, MD  amLODipine (NORVASC) 10 MG tablet Take 10 mg by mouth at bedtime. *hold for systolic blood pressure <767, call MD if holding this medication.*    Historical Provider, MD  amLODipine (NORVASC) 10 MG tablet Take 1 tablet (10 mg total) by mouth every evening. 10/01/16   Gonzella Lex, MD  aspirin 81 MG chewable tablet Chew 81 mg by mouth daily.    Historical Provider, MD  aspirin 81 MG chewable tablet Chew 1 tablet (81 mg total) by mouth daily. 10/02/16   Gonzella Lex, MD  B Complex-C-Folic Acid (RENA-VITE RX) 1 MG TABS Take 1 tablet by mouth daily. 10/01/16   Gonzella Lex, MD  bacitracin ointment Apply to affected area twice daily 11/27/16 11/27/17  Jami L Hagler, PA-C  calcium acetate (PHOSLO) 667 MG capsule Take 2 capsules (1,334 mg total) by mouth 2 (two) times daily with a meal. 10/01/16   Gonzella Lex, MD  cinacalcet (SENSIPAR) 60 MG tablet Take 1 tablet (60 mg total) by mouth daily. 10/01/16   Gonzella Lex, MD  cloNIDine (CATAPRES) 0.1 MG tablet Take 1 tablet (0.1 mg total) by mouth 2 (two) times daily. 10/01/16   Gonzella Lex, MD  cloNIDine (CATAPRES) 0.2 MG tablet Take 0.2 mg by mouth 2 (two) times daily.    Historical Provider, MD  divalproex (DEPAKOTE) 500 MG DR tablet Take 1,000 mg by mouth at bedtime.     Historical Provider, MD  divalproex (DEPAKOTE) 500 MG DR tablet Take 2 tablets (1,000 mg total) by mouth at bedtime. 10/01/16   Gonzella Lex, MD  docusate sodium (COLACE) 100 MG capsule Take 1 capsule (100 mg total) by mouth at bedtime. 10/01/16   Gonzella Lex, MD  escitalopram (LEXAPRO) 10 MG tablet Take 15 mg by mouth daily.    Historical Provider, MD  escitalopram (LEXAPRO) 10 MG tablet Take 1.5 tablets (15 mg total) by mouth  daily. 10/02/16   Gonzella Lex, MD  folic acid (FOLVITE) 1 MG tablet Take 2 tablets (2 mg total) by mouth at bedtime. 10/01/16   Gonzella Lex, MD  hydrALAZINE (APRESOLINE) 25 MG tablet Take 1 tablet (25 mg total) by mouth 3 (three) times daily. 10/01/16   Gonzella Lex, MD  insulin aspart (NOVOLOG) 100 UNIT/ML injection Inject 0-12 Units into the skin 3 (three) times daily before meals. Per sliding scale    Historical Provider, MD  insulin aspart (NOVOLOG) 100 UNIT/ML injection Inject 0-15 Units into the skin 3 (three) times daily with meals. 10/01/16   Gonzella Lex, MD  irbesartan (AVAPRO) 300 MG tablet Take 1 tablet (300 mg total) by mouth at bedtime. 10/01/16   Gonzella Lex, MD  lidocaine-prilocaine (EMLA) cream Apply 1 application topically 3 (three) times a week. Apply to access site 30 minutes before dialysis. 10/01/16   Gonzella Lex, MD  medroxyPROGESTERone (DEPO-PROVERA) 150 MG/ML injection Inject 150 mg into the muscle every 3 (three) months.    Historical Provider, MD  metoprolol (LOPRESSOR) 50 MG tablet Take 50 mg by mouth 2 (two) times daily.    Historical Provider, MD  metoprolol (LOPRESSOR) 50 MG tablet Take 1 tablet (50 mg total) by mouth 2 (two) times daily. 10/01/16   Gonzella Lex, MD  Multiple Vitamin (THEREMS) TABS Take 1 tablet by mouth daily. 10/01/16   Gonzella Lex, MD  patiromer (VELTASSA) 8.4 g packet Take 1 packet (8.4 g total) by mouth daily. 10/01/16   Gonzella Lex, MD  QUEtiapine (SEROQUEL) 200 MG tablet Take 200 mg by mouth 2 (two) times daily.     Historical Provider, MD  QUEtiapine (SEROQUEL) 200 MG tablet Take 1 tablet (200 mg total) by mouth 2 (two) times daily. 10/01/16   Gonzella Lex, MD  sulfamethoxazole-trimethoprim (BACTRIM DS,SEPTRA DS) 800-160 MG tablet Take 1 tablet by mouth 2 (two) times daily. 11/27/16   Jami L Hagler, PA-C  traZODone (DESYREL) 100 MG tablet Take 100 mg by mouth at bedtime.    Historical Provider, MD  traZODone (DESYREL) 100 MG tablet  Take 1 tablet (100 mg total) by mouth at bedtime. 10/01/16   Gonzella Lex, MD  vitamin B-12 (CYANOCOBALAMIN) 1000 MCG tablet Take 1 tablet (1,000 mcg total) by mouth at bedtime. 10/01/16   Gonzella Lex, MD    Family History History reviewed. No pertinent family history.  Social History Social History  Substance Use Topics  . Smoking status: Current Every Day Smoker    Packs/day: 0.25    Years: 0.00    Types: Cigarettes  . Smokeless tobacco: Current User  . Alcohol use No     Allergies   Patient has no known allergies.   Review of Systems Review of Systems  Unable to perform ROS: Psychiatric disorder     Physical Exam Updated Vital Signs BP 130/68 (BP Location: Right Arm)   Pulse 86   Temp 98.4 F (36.9 C) (Oral)   Resp 17   Ht 5\' 3"  (1.6 m)   Wt 83.9 kg   SpO2 100%   BMI 32.77 kg/m   Physical Exam  Constitutional: She is oriented to person, place, and time. She appears well-developed and well-nourished. No distress.  Asking for something to eat and drink  HENT:  Head: Normocephalic and atraumatic.  Eyes: Conjunctivae are normal. Pupils are equal, round, and reactive to light. Right eye exhibits no discharge. Left eye exhibits no discharge. No scleral icterus.  Neck: Normal range of motion.  Cardiovascular: Normal rate and regular rhythm.  Exam reveals no gallop and no friction rub.   No murmur heard. Pulmonary/Chest: Effort normal and breath sounds normal. No respiratory distress. She has no wheezes. She has no rales. She exhibits no tenderness.  Abdominal: Soft. Bowel sounds are normal. She exhibits no distension and no mass. There is tenderness. There is no rebound and no guarding. No hernia.  Periumbilcal scar. LUQ tenderness  Musculoskeletal:  Diffuse low back tenderness. Ambulatory. Calloused feet which are non-tender.  Neurological: She is alert and oriented to person, place, and time.  Skin: Skin is warm and dry.  Psychiatric: She has a normal mood  and affect. Her behavior is normal.  Nursing note and vitals reviewed.  ED Treatments / Results  Labs (all labs ordered are listed, but only abnormal results are displayed) Labs Reviewed  COMPREHENSIVE METABOLIC PANEL - Abnormal; Notable for the following:       Result Value   Potassium 5.9 (*)    Chloride 95 (*)    Glucose, Bld 147 (*)    BUN 77 (*)    Creatinine, Ser 10.97 (*)    Albumin 3.4 (*)    ALT 9 (*)    GFR calc non Af Amer 4 (*)    GFR calc Af Amer 5 (*)    Anion gap 16 (*)    All other components within normal limits  CBC WITH DIFFERENTIAL/PLATELET - Abnormal; Notable for the following:    RBC 2.76 (*)    Hemoglobin 7.8 (*)    HCT 27.3 (*)    MCHC 28.6 (*)    RDW 15.6 (*)    Platelets 120 (*)    All other components within normal limits  LIPASE, BLOOD - Abnormal; Notable for the following:    Lipase 77 (*)    All other components within normal limits  I-STAT BETA HCG BLOOD, ED ([facility], [facility], [facility] ONLY)    EKG  EKG Interpretation None       Radiology No results found.  Procedures Procedures (including critical care time)  Medications Ordered in ED Medications  acetaminophen (TYLENOL) tablet 325 mg (325 mg Oral Given 12/23/16 1352)     Initial Impression / Assessment and Plan / ED Course  I have reviewed the triage vital signs and the nursing notes.  Pertinent labs & imaging results that were available during my care of the patient were reviewed by me and considered in my medical decision making (see chart for details).  36 year old female presents multiple complaints. I suspect many of these are chronic. The main reason they are here is because of the elevated BP reading which is improved in the ED. Nontheless will obtain basic labs due to inability to get a good history from patient. Tylenol given for pain.  CBC remarkable for anemia which is worse than baseline. Likely due to ESRD. CMP is overall at baseline. Lipase is slightly elevated at 77 and  patient does have some LUQ tenderness on exam. She denies alcohol use. Pain is mild. Advised Tylenol prn. She is tolerating PO. Will d/c and recommend follow up with PCP. Opportunity for questions provided and all questions answered. Return precautions given.   Final Clinical Impressions(s) / ED Diagnoses   Final diagnoses:  Elevated blood pressure reading    New Prescriptions New Prescriptions   No medications on file     Recardo Evangelist, PA-C 12/24/16 Jay, MD 12/25/16 1400

## 2016-12-23 NOTE — Discharge Instructions (Signed)
Please follow up for dialysis tomorrow

## 2016-12-23 NOTE — ED Triage Notes (Signed)
Pt. Stays in a group home.

## 2016-12-23 NOTE — ED Notes (Signed)
States her bp was running high on her machine today 190/150, pt is MWF dialyiss pt ,  Pt does not produce any urine she states , no n/v or diarrhea / sob

## 2016-12-23 NOTE — ED Triage Notes (Signed)
Pt. Stated, my head, back , stomach, and my feet hurt.

## 2016-12-23 NOTE — ED Triage Notes (Signed)
Pt. Stated, my BP is high blood pressure

## 2017-01-28 ENCOUNTER — Ambulatory Visit (INDEPENDENT_AMBULATORY_CARE_PROVIDER_SITE_OTHER): Payer: Medicaid Other | Admitting: Podiatry

## 2017-01-28 ENCOUNTER — Encounter: Payer: Self-pay | Admitting: Podiatry

## 2017-01-28 DIAGNOSIS — Q828 Other specified congenital malformations of skin: Secondary | ICD-10-CM

## 2017-01-28 NOTE — Progress Notes (Signed)
This patient presents the office with chief complaint of painful callus on the bottom of both feet. She says the pain is present after walking and wearing her shoes. She is unable to self treat. Patient is diabetic and is on kidney dialysis. She presents the office today for an evaluation and treatment of these painful calluses  GENERAL APPEARANCE: Alert, conversant. Appropriately groomed. No acute distress.  VASCULAR: Pedal pulses are  palpable at  [place] and PT bilateral.  Capillary refill time is immediate to all digits,  Normal temperature gradient.   NEUROLOGIC: sensation is normal to 5.07 monofilament at 5/5 sites bilateral.  Light touch is intact bilateral, Muscle strength normal.  MUSCULOSKELETAL: acceptable muscle strength, tone and stability bilateral.  Intrinsic muscluature intact bilateral.  Rectus appearance of foot and digits noted bilateral. Plantarflexed fifth metatarsal  B/L  DERMATOLOGIC: skin color, texture, and turgor are within normal limits.  No preulcerative lesions or ulcers  are seen, no interdigital maceration noted.  No open lesions present.  Digital nails are asymptomatic. No drainage noted. Porokeratosis sub 5th metatarsal B/L  Porokeratosis sub 5th met  B/L  IE  Debride porokeratosis  B/L  RTC 3 months   Gardiner Barefoot DPM

## 2017-01-28 NOTE — Progress Notes (Addendum)
   Subjective:    Patient ID: Kelly [name], female    DOB: [birth year], 36 y.o.   MRN: [id number]  HPI   See below    Review of Systems  All other systems reviewed and are negative.      Objective:   Physical Exam        Assessment & Plan:

## 2017-04-20 ENCOUNTER — Emergency Department (HOSPITAL_COMMUNITY)
Admission: EM | Admit: 2017-04-20 | Discharge: 2017-04-20 | Disposition: A | Discharge status: Home / Self Care | Payer: Medicaid Other | Attending: Emergency Medicine | Admitting: Emergency Medicine

## 2017-04-20 ENCOUNTER — Encounter (HOSPITAL_COMMUNITY): Payer: Self-pay | Admitting: Emergency Medicine

## 2017-04-20 DIAGNOSIS — I120 Hypertensive chronic kidney disease with stage 5 chronic kidney disease or end stage renal disease: Secondary | ICD-10-CM | POA: Insufficient documentation

## 2017-04-20 DIAGNOSIS — Z7982 Long term (current) use of aspirin: Secondary | ICD-10-CM | POA: Diagnosis not present

## 2017-04-20 DIAGNOSIS — E1122 Type 2 diabetes mellitus with diabetic chronic kidney disease: Secondary | ICD-10-CM | POA: Diagnosis not present

## 2017-04-20 DIAGNOSIS — Z794 Long term (current) use of insulin: Secondary | ICD-10-CM | POA: Insufficient documentation

## 2017-04-20 DIAGNOSIS — K2900 Acute gastritis without bleeding: Secondary | ICD-10-CM

## 2017-04-20 DIAGNOSIS — N186 End stage renal disease: Secondary | ICD-10-CM | POA: Diagnosis not present

## 2017-04-20 DIAGNOSIS — F17210 Nicotine dependence, cigarettes, uncomplicated: Secondary | ICD-10-CM | POA: Diagnosis not present

## 2017-04-20 DIAGNOSIS — R1012 Left upper quadrant pain: Secondary | ICD-10-CM

## 2017-04-20 DIAGNOSIS — R109 Unspecified abdominal pain: Secondary | ICD-10-CM | POA: Diagnosis present

## 2017-04-20 LAB — COMPREHENSIVE METABOLIC PANEL
ALBUMIN: 3.8 g/dL (ref 3.5–5.0)
ALK PHOS: 97 U/L (ref 38–126)
ALT: 11 U/L — ABNORMAL LOW (ref 14–54)
ANION GAP: 14 (ref 5–15)
AST: 17 U/L (ref 15–41)
BILIRUBIN TOTAL: 0.6 mg/dL (ref 0.3–1.2)
BUN: 38 mg/dL — AB (ref 6–20)
CO2: 29 mmol/L (ref 22–32)
Calcium: 9.9 mg/dL (ref 8.9–10.3)
Chloride: 94 mmol/L — ABNORMAL LOW (ref 101–111)
Creatinine, Ser: 9.49 mg/dL — ABNORMAL HIGH (ref 0.44–1.00)
GFR calc Af Amer: 6 mL/min — ABNORMAL LOW (ref >60)
GFR calc non Af Amer: 5 mL/min — ABNORMAL LOW (ref >60)
GLUCOSE: 111 mg/dL — AB (ref 65–99)
POTASSIUM: 4.8 mmol/L (ref 3.5–5.1)
SODIUM: 137 mmol/L (ref 135–145)
TOTAL PROTEIN: 7.3 g/dL (ref 6.5–8.1)

## 2017-04-20 LAB — CBC
HEMATOCRIT: 40.1 % (ref 36.0–46.0)
Hemoglobin: 13 g/dL (ref 12.0–15.0)
MCH: 33.5 pg (ref 26.0–34.0)
MCHC: 32.4 g/dL (ref 30.0–36.0)
MCV: 103.4 fL — ABNORMAL HIGH (ref 78.0–100.0)
Platelets: 199 10*3/uL (ref 150–400)
RBC: 3.88 MIL/uL (ref 3.87–5.11)
RDW: 16.1 % — AB (ref 11.5–15.5)
WBC: 5.4 10*3/uL (ref 4.0–10.5)

## 2017-04-20 LAB — LIPASE, BLOOD: Lipase: 67 U/L — ABNORMAL HIGH (ref 11–51)

## 2017-04-20 MED ORDER — PANTOPRAZOLE SODIUM 20 MG PO TBEC: 20.0000 mg | DELAYED_RELEASE_TABLET | Freq: Every day | ORAL | 0 refills | Status: DC

## 2017-04-20 MED ORDER — PANTOPRAZOLE SODIUM 20 MG PO TBEC
20.0000 mg | DELAYED_RELEASE_TABLET | Freq: Once | ORAL | Status: AC
  Administered 2017-04-20: 20 mg via ORAL
  Filled 2017-04-20 (×2): qty 1

## 2017-04-20 MED ORDER — GI COCKTAIL ~~LOC~~
30.0000 mL | Freq: Once | ORAL | Status: AC
  Administered 2017-04-20: 30 mL via ORAL
  Filled 2017-04-20: qty 30

## 2017-04-20 NOTE — ED Notes (Signed)
Called pharmacy about the delay in medication prior to discharge

## 2017-04-20 NOTE — ED Notes (Signed)
Pt discharge was delayed because of waiting for medication from pharmacy

## 2017-04-20 NOTE — ED Triage Notes (Addendum)
Received pt via ems from a group home [facility]) with c/o abdominal pain with N/V onset 10 am. Pt reports vomited x 5 today. Pt denies change in diet or exposure to others with illness.

## 2017-04-20 NOTE — Discharge Instructions (Signed)
Please read and follow all provided instructions.  Your diagnoses today include:  1. Left upper quadrant pain   2. Acute gastritis without hemorrhage, unspecified gastritis type     Tests performed today include: Vital signs. See below for your results today.   Medications prescribed:  Take as prescribed   Home care instructions:  Follow any educational materials contained in this packet.  Follow-up instructions: Please follow-up with your primary care provider for further evaluation of symptoms and treatment   Return instructions:  Please return to the Emergency Department if you do not get better, if you get worse, or new symptoms OR  - Fever (temperature greater than 101.48F)  - Bleeding that does not stop with holding pressure to the area    -Severe pain (please note that you may be more sore the day after your accident)  - Chest Pain  - Difficulty breathing  - Severe nausea or vomiting  - Inability to tolerate food and liquids  - Passing out  - Skin becoming red around your wounds  - Change in mental status (confusion or lethargy)  - New numbness or weakness    Please return if you have any other emergent concerns.  Additional Information:  Your vital signs today were: BP 124/89    Pulse 79    Temp 98.5 F (36.9 C) (Oral)    Resp 16    Ht 5' 2.5" (1.588 m)    Wt 84.8 kg (187 lb)    SpO2 100%    BMI 33.66 kg/m  If your blood pressure (BP) was elevated above 135/85 this visit, please have this repeated by your doctor within one month. ---------------

## 2017-04-20 NOTE — ED Provider Notes (Signed)
[facility] DEPT Provider Note   CSN: [id number] Arrival date & time: 04/20/17  1500  History   Chief Complaint Chief Complaint  Patient presents with  . Abdominal Pain  . Emesis    HPI Kelly [name] is a 36 y.o. female.  HPI  36 y.o. female with a hx of ESRD on M/W/F Dialysis, ETOH Abuse, Bipolar Disorder, DM, HTN, presents to the Emergency Department today complaining of abdominal pain with N/V x 5 since 10am today. Has since resolved. Notes continued abdominal pain since then and rates 10/10 and generalized. No diarrhea. No CP/SOB. No hx same. No fevers. No cough or congestion. Pt presents from [place] with care giver. HPI limited due to developmental delay. Pt states she has not eaten today. Denies worsening pain with PO intake. No melena or hematochezia. Notes regular BMs every day. Pt denies dysuria as she does not produce urine. Completed Dialysis session yesterday. No sick contacts. No other symptoms noted.    Past Medical History:  Diagnosis Date  . Alcohol abuse    chronic  . Bipolar disorder ([place])   . Chronic kidney disease   . Cognitive changes   . Cognitive impairment   . Depression   . Depression   . Diabetes mellitus without complication ([place])   . Elevated lipids   . Hyperparathyroidism ([place])   . Hypertension   . Staph aureus infection    A/V fistula  . UTI (lower urinary tract infection)     Patient Active Problem List   Diagnosis Date Noted  . 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 Hyperkalemia 06/25/2016  . Adjustment disorder with mixed disturbance of emotions and conduct   . Diabetes ([address]) 04/23/2015  . Essential hypertension 04/23/2015  . ESRD on dialysis ([place]) 04/23/2015  . Pain of left arm 04/22/2015    Past Surgical History:  Procedure Laterality Date  . AV FISTULA PLACEMENT    . CHOLECYSTECTOMY    . dialysis perma cath Right    chest  . PERIPHERAL VASCULAR CATHETERIZATION N/A 05/24/2015   Procedure:  Dialysis/Perma Catheter Removal;  Surgeon: Katha Cabal, MD;  Location: [facility];  Service: Cardiovascular;  Laterality: N/A;  . PERIPHERAL VASCULAR CATHETERIZATION Left 02/28/2016   Procedure: A/V Shuntogram/Fistulagram;  Surgeon: Katha Cabal, MD;  Location: [facility];  Service: Cardiovascular;  Laterality: Left;  . PERIPHERAL VASCULAR CATHETERIZATION N/A 02/28/2016   Procedure: A/V Shunt Intervention;  Surgeon: Katha Cabal, MD;  Location: [facility];  Service: Cardiovascular;  Laterality: N/A;  . PERIPHERAL VASCULAR CATHETERIZATION N/A 06/26/2016   Procedure: A/V Shuntogram/Fistulagram;  Surgeon: Katha Cabal, MD;  Location: [facility];  Service: Cardiovascular;  Laterality: N/A;  . PERIPHERAL VASCULAR CATHETERIZATION N/A 09/10/2016   Procedure: A/V Shuntogram/Fistulagram;  Surgeon: Algernon Huxley, MD;  Location: [facility];  Service: Cardiovascular;  Laterality: N/A;  . PERIPHERAL VASCULAR CATHETERIZATION Left 09/14/2016   Procedure: Thrombectomy;  Surgeon: Katha Cabal, MD;  Location: [facility];  Service: Cardiovascular;  Laterality: Left;  Marland Kitchen VASCULAR ACCESS DEVICE INSERTION Left 04/22/2015   Procedure: INSERTION OF HERO VASCULAR ACCESS DEVICE;  Surgeon: Katha Cabal, MD;  Location: [facility];  Service: Vascular;  Laterality: Left;    OB History    No data available       Home Medications    Prior to Admission medications   Medication Sig Start Date End Date Taking? Authorizing Provider  acetaminophen (  TYLENOL) 325 MG tablet Take 2 tablets (650 mg total) by mouth every 6 (six) hours as needed for mild pain, moderate pain or headache. 10/01/16   Clapacs, Madie Reno, MD  amLODipine (NORVASC) 10 MG tablet Take 10 mg by mouth at bedtime. *hold for systolic blood pressure <124, call MD if holding this medication.*    [provider]  amLODipine (NORVASC) 10 MG tablet Take 1 tablet (10 mg total) by mouth  every evening. 10/01/16   Clapacs, Madie Reno, MD  aspirin 81 MG chewable tablet Chew 81 mg by mouth daily.    [provider]  aspirin 81 MG chewable tablet Chew 1 tablet (81 mg total) by mouth daily. 10/02/16   Clapacs, Madie Reno, MD  B Complex-C-Folic Acid (RENA-VITE RX) 1 MG TABS Take 1 tablet by mouth daily. 10/01/16   Clapacs, Madie Reno, MD  bacitracin ointment Apply to affected area twice daily 11/27/16 11/27/17  Hagler, Jami L, PA-C  calcium acetate (PHOSLO) 667 MG capsule Take 2 capsules (1,334 mg total) by mouth 2 (two) times daily with a meal. 10/01/16   Clapacs, Madie Reno, MD  cinacalcet (SENSIPAR) 60 MG tablet Take 1 tablet (60 mg total) by mouth daily. 10/01/16   Clapacs, Madie Reno, MD  cloNIDine (CATAPRES) 0.1 MG tablet Take 1 tablet (0.1 mg total) by mouth 2 (two) times daily. 10/01/16   Clapacs, Madie Reno, MD  cloNIDine (CATAPRES) 0.2 MG tablet Take 0.2 mg by mouth 2 (two) times daily.    [provider]  divalproex (DEPAKOTE) 500 MG DR tablet Take 1,000 mg by mouth at bedtime.     [provider]  divalproex (DEPAKOTE) 500 MG DR tablet Take 2 tablets (1,000 mg total) by mouth at bedtime. 10/01/16   Clapacs, Madie Reno, MD  docusate sodium (COLACE) 100 MG capsule Take 1 capsule (100 mg total) by mouth at bedtime. 10/01/16   Clapacs, Madie Reno, MD  escitalopram (LEXAPRO) 10 MG tablet Take 15 mg by mouth daily.    [provider]  escitalopram (LEXAPRO) 10 MG tablet Take 1.5 tablets (15 mg total) by mouth daily. 10/02/16   Clapacs, Madie Reno, MD  folic acid (FOLVITE) 1 MG tablet Take 2 tablets (2 mg total) by mouth at bedtime. 10/01/16   Clapacs, Madie Reno, MD  hydrALAZINE (APRESOLINE) 25 MG tablet Take 1 tablet (25 mg total) by mouth 3 (three) times daily. 10/01/16   Clapacs, Madie Reno, MD  insulin aspart (NOVOLOG) 100 UNIT/ML injection Inject 0-12 Units into the skin 3 (three) times daily before meals. Per sliding scale    [provider]  insulin aspart (NOVOLOG) 100 UNIT/ML injection  Inject 0-15 Units into the skin 3 (three) times daily with meals. 10/01/16   Clapacs, Madie Reno, MD  irbesartan (AVAPRO) 300 MG tablet Take 1 tablet (300 mg total) by mouth at bedtime. 10/01/16   Clapacs, Madie Reno, MD  lidocaine-prilocaine (EMLA) cream Apply 1 application topically 3 (three) times a week. Apply to access site 30 minutes before dialysis. 10/01/16   Clapacs, Madie Reno, MD  medroxyPROGESTERone (DEPO-PROVERA) 150 MG/ML injection Inject 150 mg into the muscle every 3 (three) months.    [provider]  metoprolol (LOPRESSOR) 50 MG tablet Take 50 mg by mouth 2 (two) times daily.    [provider]  metoprolol (LOPRESSOR) 50 MG tablet Take 1 tablet (50 mg total) by mouth 2 (two) times daily. 10/01/16   Clapacs, Madie Reno, MD  Multiple Vitamin (THEREMS) TABS Take  1 tablet by mouth daily. 10/01/16   Clapacs, Madie Reno, MD  patiromer (VELTASSA) 8.4 g packet Take 1 packet (8.4 g total) by mouth daily. 10/01/16   Clapacs, Madie Reno, MD  QUEtiapine (SEROQUEL) 200 MG tablet Take 200 mg by mouth 2 (two) times daily.     [provider]  QUEtiapine (SEROQUEL) 200 MG tablet Take 1 tablet (200 mg total) by mouth 2 (two) times daily. 10/01/16   Clapacs, Madie Reno, MD  sulfamethoxazole-trimethoprim (BACTRIM DS,SEPTRA DS) 800-160 MG tablet Take 1 tablet by mouth 2 (two) times daily. 11/27/16   Hagler, Jami L, PA-C  traZODone (DESYREL) 100 MG tablet Take 100 mg by mouth at bedtime.    [provider]  traZODone (DESYREL) 100 MG tablet Take 1 tablet (100 mg total) by mouth at bedtime. 10/01/16   Clapacs, Madie Reno, MD  vitamin B-12 (CYANOCOBALAMIN) 1000 MCG tablet Take 1 tablet (1,000 mcg total) by mouth at bedtime. 10/01/16   Clapacs, Madie Reno, MD    Family History No family history on file.  Social History Social History  Substance Use Topics  . Smoking status: Current Every Day Smoker    Packs/day: 0.25    Years: 0.00    Types: Cigarettes  . Smokeless tobacco: Current User  . Alcohol use No       Allergies   Patient has no known allergies.   Review of Systems Review of Systems ROS reviewed and all are negative for acute change except as noted in the HPI.  Physical Exam Updated Vital Signs BP 138/72 (BP Location: Right Arm)   Pulse 84   Temp 98.5 F (36.9 C) (Oral)   Resp 18   Ht 5' 2.5" (1.588 m)   Wt 84.8 kg (187 lb)   SpO2 100%   BMI 33.66 kg/m   Physical Exam  Constitutional: She is oriented to person, place, and time. Vital signs are normal. She appears well-developed and well-nourished.  NAD. Resting comfortably.   HENT:  Head: Normocephalic and atraumatic.  Right Ear: Hearing normal.  Left Ear: Hearing normal.  Eyes: Conjunctivae and EOM are normal. Pupils are equal, round, and reactive to light.  Neck: Normal range of motion. Neck supple.  Cardiovascular: Normal rate, regular rhythm, normal heart sounds and intact distal pulses.   Pulmonary/Chest: Effort normal and breath sounds normal.  Abdominal: Soft. Normal appearance and bowel sounds are normal. There is generalized tenderness. There is no rigidity, no rebound, no guarding, no CVA tenderness, no tenderness at McBurney's point and negative Murphy's sign.  Abdomen Soft  Musculoskeletal: Normal range of motion.  Neurological: She is alert and oriented to person, place, and time.  Skin: Skin is warm and dry.  Psychiatric: She has a normal mood and affect. Her speech is normal and behavior is normal. Thought content normal.  Nursing note and vitals reviewed.  ED Treatments / Results  Labs (all labs ordered are listed, but only abnormal results are displayed) Labs Reviewed  LIPASE, BLOOD  COMPREHENSIVE METABOLIC PANEL  CBC  URINALYSIS, ROUTINE W REFLEX MICROSCOPIC    EKG  EKG Interpretation None       Radiology No results found.  Procedures Procedures (including critical care time)  Medications Ordered in ED Medications - No data to display   Initial Impression / Assessment and  Plan / ED Course  I have reviewed the triage vital signs and the nursing notes.  Pertinent labs & imaging results that were available during my care of the patient  were reviewed by me and considered in my medical decision making (see chart for details).  Final Clinical Impressions(s) / ED Diagnoses  {I have reviewed and evaluated the relevant laboratory values.   {I have reviewed the relevant previous healthcare records.  {I obtained HPI from historian.   ED Course:  Assessment: Patient is a 36 y.o. female hx ESRD on M/W/F Dialysis, ETOH Abuse, Bipolar Disorder, DM, HTNpresents with abdominal pain x since 10am. Associated N/V x 5 that has since resolved. No diarrhea. No CP/SOB. No fevers. Pt went to dialysis session and completed. On exam, nontoxic, nonseptic appearing, in no apparent distress. Patient's pain and other symptoms adequately managed in emergency department. Lab and vitals reviewed. Unremarkable. Patient does not meet the SIRS or Sepsis criteria.  On repeat exam patient does not have a surgical abdomen and there are no peritoneal signs.  No indication of appendicitis, bowel obstruction, bowel perforation, cholecystitis, diverticulitis, PID or ectopic pregnancy. Likely reflux vs gastritis. Complete relief of symptoms with GI cocktail. Patient discharged home with symptomatic treatment and given strict instructions for follow-up with their primary care physician.  I have also discussed reasons to return immediately to the ER.  Patient expresses understanding and agrees with plan.  Disposition/Plan:  DC Home Additional Verbal discharge instructions given and discussed with patient.  Pt Instructed to f/u with PCP in the next week for evaluation and treatment of symptoms. Return precautions given Pt acknowledges and agrees with plan  Supervising Physician Gareth Morgan, MD  Final diagnoses:  Left upper quadrant pain  Acute gastritis without hemorrhage, unspecified gastritis type     New Prescriptions New Prescriptions   No medications on file     Shary Decamp, Hershal Coria 04/20/17 1900    Gareth Morgan, MD 04/21/17 1651

## 2017-05-06 ENCOUNTER — Ambulatory Visit: Payer: Medicaid Other | Admitting: Podiatry

## 2017-06-18 ENCOUNTER — Encounter: Payer: Self-pay | Admitting: Emergency Medicine

## 2017-06-18 ENCOUNTER — Inpatient Hospital Stay
Admission: EM | Admit: 2017-06-18 | Discharge: 2017-07-01 | DRG: 562 | Disposition: A | Discharge status: Home / Self Care | Payer: Medicaid Other | Attending: Internal Medicine | Admitting: Internal Medicine

## 2017-06-18 ENCOUNTER — Emergency Department: Payer: Medicaid Other

## 2017-06-18 DIAGNOSIS — S92324A Nondisplaced fracture of second metatarsal bone, right foot, initial encounter for closed fracture: Secondary | ICD-10-CM | POA: Diagnosis present

## 2017-06-18 DIAGNOSIS — S92325A Nondisplaced fracture of second metatarsal bone, left foot, initial encounter for closed fracture: Secondary | ICD-10-CM

## 2017-06-18 DIAGNOSIS — S82831A Other fracture of upper and lower end of right fibula, initial encounter for closed fracture: Secondary | ICD-10-CM

## 2017-06-18 DIAGNOSIS — Z992 Dependence on renal dialysis: Secondary | ICD-10-CM

## 2017-06-18 DIAGNOSIS — N2581 Secondary hyperparathyroidism of renal origin: Secondary | ICD-10-CM | POA: Diagnosis present

## 2017-06-18 DIAGNOSIS — Y92410 Unspecified street and highway as the place of occurrence of the external cause: Secondary | ICD-10-CM | POA: Diagnosis not present

## 2017-06-18 DIAGNOSIS — S92344A Nondisplaced fracture of fourth metatarsal bone, right foot, initial encounter for closed fracture: Secondary | ICD-10-CM | POA: Diagnosis present

## 2017-06-18 DIAGNOSIS — R6250 Unspecified lack of expected normal physiological development in childhood: Secondary | ICD-10-CM | POA: Diagnosis present

## 2017-06-18 DIAGNOSIS — E1122 Type 2 diabetes mellitus with diabetic chronic kidney disease: Secondary | ICD-10-CM | POA: Diagnosis present

## 2017-06-18 DIAGNOSIS — K219 Gastro-esophageal reflux disease without esophagitis: Secondary | ICD-10-CM | POA: Diagnosis present

## 2017-06-18 DIAGNOSIS — Z9115 Patient's noncompliance with renal dialysis: Secondary | ICD-10-CM

## 2017-06-18 DIAGNOSIS — Z9049 Acquired absence of other specified parts of digestive tract: Secondary | ICD-10-CM

## 2017-06-18 DIAGNOSIS — E875 Hyperkalemia: Secondary | ICD-10-CM | POA: Diagnosis not present

## 2017-06-18 DIAGNOSIS — Z7982 Long term (current) use of aspirin: Secondary | ICD-10-CM | POA: Diagnosis not present

## 2017-06-18 DIAGNOSIS — N186 End stage renal disease: Secondary | ICD-10-CM | POA: Diagnosis present

## 2017-06-18 DIAGNOSIS — S92315A Nondisplaced fracture of first metatarsal bone, left foot, initial encounter for closed fracture: Secondary | ICD-10-CM

## 2017-06-18 DIAGNOSIS — R4189 Other symptoms and signs involving cognitive functions and awareness: Secondary | ICD-10-CM | POA: Diagnosis present

## 2017-06-18 DIAGNOSIS — F17210 Nicotine dependence, cigarettes, uncomplicated: Secondary | ICD-10-CM | POA: Diagnosis present

## 2017-06-18 DIAGNOSIS — S92345A Nondisplaced fracture of fourth metatarsal bone, left foot, initial encounter for closed fracture: Secondary | ICD-10-CM | POA: Diagnosis present

## 2017-06-18 DIAGNOSIS — S92335A Nondisplaced fracture of third metatarsal bone, left foot, initial encounter for closed fracture: Secondary | ICD-10-CM | POA: Diagnosis present

## 2017-06-18 DIAGNOSIS — Z5320 Procedure and treatment not carried out because of patient's decision for unspecified reasons: Secondary | ICD-10-CM | POA: Diagnosis present

## 2017-06-18 DIAGNOSIS — F4325 Adjustment disorder with mixed disturbance of emotions and conduct: Secondary | ICD-10-CM | POA: Diagnosis present

## 2017-06-18 DIAGNOSIS — F319 Bipolar disorder, unspecified: Secondary | ICD-10-CM | POA: Diagnosis present

## 2017-06-18 DIAGNOSIS — S8292XA Unspecified fracture of left lower leg, initial encounter for closed fracture: Secondary | ICD-10-CM

## 2017-06-18 DIAGNOSIS — W1789XA Other fall from one level to another, initial encounter: Secondary | ICD-10-CM | POA: Diagnosis present

## 2017-06-18 DIAGNOSIS — D631 Anemia in chronic kidney disease: Secondary | ICD-10-CM | POA: Diagnosis present

## 2017-06-18 DIAGNOSIS — F209 Schizophrenia, unspecified: Secondary | ICD-10-CM | POA: Diagnosis present

## 2017-06-18 DIAGNOSIS — S92334A Nondisplaced fracture of third metatarsal bone, right foot, initial encounter for closed fracture: Secondary | ICD-10-CM

## 2017-06-18 DIAGNOSIS — Z794 Long term (current) use of insulin: Secondary | ICD-10-CM

## 2017-06-18 DIAGNOSIS — F329 Major depressive disorder, single episode, unspecified: Secondary | ICD-10-CM | POA: Diagnosis present

## 2017-06-18 DIAGNOSIS — I120 Hypertensive chronic kidney disease with stage 5 chronic kidney disease or end stage renal disease: Secondary | ICD-10-CM | POA: Diagnosis present

## 2017-06-18 DIAGNOSIS — S92314A Nondisplaced fracture of first metatarsal bone, right foot, initial encounter for closed fracture: Secondary | ICD-10-CM | POA: Diagnosis present

## 2017-06-18 DIAGNOSIS — S92354A Nondisplaced fracture of fifth metatarsal bone, right foot, initial encounter for closed fracture: Secondary | ICD-10-CM | POA: Diagnosis present

## 2017-06-18 DIAGNOSIS — S8291XA Unspecified fracture of right lower leg, initial encounter for closed fracture: Secondary | ICD-10-CM | POA: Diagnosis present

## 2017-06-18 DIAGNOSIS — S82401A Unspecified fracture of shaft of right fibula, initial encounter for closed fracture: Secondary | ICD-10-CM | POA: Diagnosis present

## 2017-06-18 MED ORDER — MORPHINE SULFATE (PF) 4 MG/ML IV SOLN
4.0000 mg | Freq: Once | INTRAVENOUS | Status: AC
  Administered 2017-06-19: 4 mg via INTRAVENOUS
  Filled 2017-06-18: qty 1

## 2017-06-18 MED ORDER — HYDROCODONE-ACETAMINOPHEN 5-325 MG PO TABS
1.0000 | ORAL_TABLET | Freq: Once | ORAL | Status: AC
  Administered 2017-06-18: 1 via ORAL
  Filled 2017-06-18: qty 1

## 2017-06-18 MED ORDER — ONDANSETRON HCL 4 MG/2ML IJ SOLN
4.0000 mg | Freq: Once | INTRAMUSCULAR | Status: AC
  Administered 2017-06-19: 4 mg via INTRAVENOUS
  Filled 2017-06-18: qty 2

## 2017-06-18 NOTE — ED Notes (Signed)
Report that pt fell at group home @ noon today. Pt was ambulatory after fall w/o complaints. Pt went on outing w/ group home and saw her boyfriend in public. Pt's boyfriend was asked to leave by staff. Pt was trying to get to boyfriend and at this time she was unable to ambulate from [place]. Pt was belligerent and difficult during xrays, refused to move and position even though she had demonstrated she was able to do so. Group home owner who has accompanied pt to ED stated he had never seen her behave this way.

## 2017-06-18 NOTE — ED Notes (Addendum)
Pt moved to room 19 from flex..  Report off to [employer].  Pt calm and cooperative.

## 2017-06-18 NOTE — H&P (Signed)
History and Physical   [facility] - [place] @ [facility] Admission History and Physical [employer], D.O.    Patient Name: Kelly [name] MR#: [id number] Date of Birth: May 31, 1981 Date of Admission: 06/18/2017  Referring MD/NP/PA: PA Betha Loa Primary Care Physician: Remi Haggard, FNP  Chief Complaint:  Chief Complaint  Patient presents with  . Foot Pain    HPI: Kelly [name] is a 36 y.o. female with a known history of Bipolar disorder with significant cognitive impairment, combative and aggressive behavior, CKD, depression, diabetes, hypertension, hyperlipidemia presents to the emergency department for evaluation of bilateral foot pain.  Patient was in a usual state of health until this afternoon when she is walking she states that she had rolled her feet while walking, immediately fell to the ground was unable to bear weight secondary to pain. In the emergency department she was found to have multiple nonsurgical fractures of the toes on both feet however she is unable to bear weight.   Patient denies fevers/chills, weakness, dizziness, chest pain, shortness of breath, N/V/C/D, abdominal pain, dysuria/frequency, changes in mental status.    Per group home  patient has been stable with respect to her psychiatric diagnoses. Otherwise there has been no change in status. Patient has been taking medication as prescribed and there has been no recent change in medication or diet.  No recent antibiotics.  There has been no recent illness, hospitalizations, travel or sick contacts.    Medical admission was requested for pain control, physical therapy evaluation and consideration of placement for rehabilitation.  Review of Systems:  CONSTITUTIONAL: No fever/chills, fatigue, weakness, weight gain/loss, headache. EYES: No blurry or double vision. ENT: No tinnitus, postnasal drip, redness or soreness of the oropharynx. RESPIRATORY: No cough, dyspnea, wheeze.  No  hemoptysis.  CARDIOVASCULAR: No chest pain, palpitations, syncope, orthopnea. No lower extremity edema.  GASTROINTESTINAL: No nausea, vomiting, abdominal pain, diarrhea, constipation.  No hematemesis, melena or hematochezia. GENITOURINARY: No dysuria, frequency, hematuria. ENDOCRINE: No polyuria or nocturia. No heat or cold intolerance. HEMATOLOGY: No anemia, bruising, bleeding. INTEGUMENTARY: No rashes, ulcers, lesions. MUSCULOSKELETAL:  positive bilateral foot painNo arthritis, gout, dyspnea. NEUROLOGIC: No numbness, tingling, ataxia, seizure-type activity, weakness. PSYCHIATRIC: No anxiety, depression, insomnia.   Past Medical History:  Diagnosis Date  . Alcohol abuse    chronic  . Bipolar disorder ([place])   . Chronic kidney disease   . Cognitive changes   . Cognitive impairment   . Depression   . Depression   . Diabetes mellitus without complication ([place])   . Elevated lipids   . Hyperparathyroidism ([place])   . Hypertension   . Staph aureus infection    A/V fistula  . UTI (lower urinary tract infection)     Past Surgical History:  Procedure Laterality Date  . AV FISTULA PLACEMENT    . CHOLECYSTECTOMY    . dialysis perma cath Right    chest  . PERIPHERAL VASCULAR CATHETERIZATION N/A 05/24/2015   Procedure: Dialysis/Perma Catheter Removal;  Surgeon: Katha Cabal, MD;  Location: [facility];  Service: Cardiovascular;  Laterality: N/A;  . PERIPHERAL VASCULAR CATHETERIZATION Left 02/28/2016   Procedure: A/V Shuntogram/Fistulagram;  Surgeon: Katha Cabal, MD;  Location: [place] [facility];  Service: Cardiovascular;  Laterality: Left;  . PERIPHERAL VASCULAR CATHETERIZATION N/A 02/28/2016   Procedure: A/V Shunt Intervention;  Surgeon: Katha Cabal, MD;  Location: [place] [facility];  Service: Cardiovascular;  Laterality: N/A;  . PERIPHERAL VASCULAR CATHETERIZATION N/A 06/26/2016  Procedure: A/V Shuntogram/Fistulagram;  Surgeon: Katha Cabal, MD;   Location: [place] [facility];  Service: Cardiovascular;  Laterality: N/A;  . PERIPHERAL VASCULAR CATHETERIZATION N/A 09/10/2016   Procedure: A/V Shuntogram/Fistulagram;  Surgeon: Algernon Huxley, MD;  Location: [place] [facility];  Service: Cardiovascular;  Laterality: N/A;  . PERIPHERAL VASCULAR CATHETERIZATION Left 09/14/2016   Procedure: Thrombectomy;  Surgeon: Katha Cabal, MD;  Location: [place] [facility];  Service: Cardiovascular;  Laterality: Left;  Marland Kitchen VASCULAR ACCESS DEVICE INSERTION Left 04/22/2015   Procedure: INSERTION OF HERO VASCULAR ACCESS DEVICE;  Surgeon: Katha Cabal, MD;  Location: [facility];  Service: Vascular;  Laterality: Left;     reports that she has been smoking Cigarettes.  She has been smoking about 0.25 packs per day for the past 0.00 years. She uses smokeless tobacco. She reports that she does not drink alcohol or use drugs.  No Known Allergies  No family history on file.  Prior to Admission medications   Medication Sig Start Date End Date Taking? Authorizing Provider  acetaminophen (TYLENOL) 325 MG tablet Take 2 tablets (650 mg total) by mouth every 6 (six) hours as needed for mild pain, moderate pain or headache. 10/01/16   Clapacs, Madie Reno, MD  amLODipine (NORVASC) 10 MG tablet Take 10 mg by mouth at bedtime. *hold for systolic blood pressure <409, call MD if holding this medication.*    [provider]  amLODipine (NORVASC) 10 MG tablet Take 1 tablet (10 mg total) by mouth every evening. 10/01/16   Clapacs, Madie Reno, MD  aspirin 81 MG chewable tablet Chew 81 mg by mouth daily.    [provider]  aspirin 81 MG chewable tablet Chew 1 tablet (81 mg total) by mouth daily. 10/02/16   Clapacs, Madie Reno, MD  B Complex-C-Folic Acid (RENA-VITE RX) 1 MG TABS Take 1 tablet by mouth daily. 10/01/16   Clapacs, Madie Reno, MD  bacitracin ointment Apply to affected area twice daily 11/27/16 11/27/17  Hagler, Jami L, PA-C  calcium acetate (PHOSLO) 667 MG capsule  Take 2 capsules (1,334 mg total) by mouth 2 (two) times daily with a meal. 10/01/16   Clapacs, Madie Reno, MD  cinacalcet (SENSIPAR) 60 MG tablet Take 1 tablet (60 mg total) by mouth daily. 10/01/16   Clapacs, Madie Reno, MD  cloNIDine (CATAPRES) 0.1 MG tablet Take 1 tablet (0.1 mg total) by mouth 2 (two) times daily. 10/01/16   Clapacs, Madie Reno, MD  cloNIDine (CATAPRES) 0.2 MG tablet Take 0.2 mg by mouth 2 (two) times daily.    [provider]  divalproex (DEPAKOTE) 500 MG DR tablet Take 1,000 mg by mouth at bedtime.     [provider]  divalproex (DEPAKOTE) 500 MG DR tablet Take 2 tablets (1,000 mg total) by mouth at bedtime. 10/01/16   Clapacs, Madie Reno, MD  docusate sodium (COLACE) 100 MG capsule Take 1 capsule (100 mg total) by mouth at bedtime. 10/01/16   Clapacs, Madie Reno, MD  escitalopram (LEXAPRO) 10 MG tablet Take 15 mg by mouth daily.    [provider]  escitalopram (LEXAPRO) 10 MG tablet Take 1.5 tablets (15 mg total) by mouth daily. 10/02/16   Clapacs, Madie Reno, MD  folic acid (FOLVITE) 1 MG tablet Take 2 tablets (2 mg total) by mouth at bedtime. 10/01/16   Clapacs, Madie Reno, MD  hydrALAZINE (APRESOLINE) 25 MG tablet Take 1 tablet (25 mg total) by mouth 3 (three) times daily. 10/01/16   Clapacs,  Madie Reno, MD  insulin aspart (NOVOLOG) 100 UNIT/ML injection Inject 0-12 Units into the skin 3 (three) times daily before meals. Per sliding scale    [provider]  insulin aspart (NOVOLOG) 100 UNIT/ML injection Inject 0-15 Units into the skin 3 (three) times daily with meals. 10/01/16   Clapacs, Madie Reno, MD  irbesartan (AVAPRO) 300 MG tablet Take 1 tablet (300 mg total) by mouth at bedtime. 10/01/16   Clapacs, Madie Reno, MD  lidocaine-prilocaine (EMLA) cream Apply 1 application topically 3 (three) times a week. Apply to access site 30 minutes before dialysis. 10/01/16   Clapacs, Madie Reno, MD  medroxyPROGESTERone (DEPO-PROVERA) 150 MG/ML injection Inject 150 mg into the muscle every 3 (three)  months.    [provider]  metoprolol (LOPRESSOR) 50 MG tablet Take 50 mg by mouth 2 (two) times daily.    [provider]  metoprolol (LOPRESSOR) 50 MG tablet Take 1 tablet (50 mg total) by mouth 2 (two) times daily. 10/01/16   Clapacs, Madie Reno, MD  Multiple Vitamin (THEREMS) TABS Take 1 tablet by mouth daily. 10/01/16   Clapacs, Madie Reno, MD  pantoprazole (PROTONIX) 20 MG tablet Take 1 tablet (20 mg total) by mouth daily. 04/20/17   Shary Decamp, PA-C  patiromer (VELTASSA) 8.4 g packet Take 1 packet (8.4 g total) by mouth daily. 10/01/16   Clapacs, Madie Reno, MD  QUEtiapine (SEROQUEL) 200 MG tablet Take 200 mg by mouth 2 (two) times daily.     [provider]  QUEtiapine (SEROQUEL) 200 MG tablet Take 1 tablet (200 mg total) by mouth 2 (two) times daily. 10/01/16   Clapacs, Madie Reno, MD  sulfamethoxazole-trimethoprim (BACTRIM DS,SEPTRA DS) 800-160 MG tablet Take 1 tablet by mouth 2 (two) times daily. 11/27/16   Hagler, Jami L, PA-C  traZODone (DESYREL) 100 MG tablet Take 100 mg by mouth at bedtime.    [provider]  traZODone (DESYREL) 100 MG tablet Take 1 tablet (100 mg total) by mouth at bedtime. 10/01/16   Clapacs, Madie Reno, MD  vitamin B-12 (CYANOCOBALAMIN) 1000 MCG tablet Take 1 tablet (1,000 mcg total) by mouth at bedtime. 10/01/16   Clapacs, Madie Reno, MD    Physical Exam: Vitals:   06/18/17 1855 06/18/17 1856  BP: 122/79   Pulse: 98   Resp: 18   Temp: 98.6 F (37 C)   TempSrc: Oral   SpO2: 97%   Weight:  84.8 kg (187 lb)  Height:  5' (1.524 m)    GENERAL: 36 y.o.-yeBlack female patient, well-developed, well-nourished lying in the bed in no acute distress.  Pleasant and cooperative.   HEENT: Head atraumatic, normocephalic. Pupils equal. Mucus membranes moist. NECK: Supple, full range of motion. No JVD, no bruit heard. No thyroid enlargement, no tenderness, no cervical lymphadenopathy. CHEST: Normal breath sounds bilaterally. No wheezing, rales, rhonchi or  crackles. No use of accessory muscles of respiration.  No reproducible chest wall tenderness.  CARDIOVASCULAR: S1, S2 normal. No murmurs, rubs, or gallops. Cap refill <2 seconds. Pulses intact distally.  ABDOMEN: Soft, nondistended, nontender. No rebound, guarding, rigidity. Normoactive bowel sounds present in all four quadrants.  EXTREMITIES:  mild edema of the right foot. tenderness to palpation of the tarsals bilaterally. Full range of motion of the ankles and toes. Pulses intact. Sensation intact..No pedal edema, cyanosis, or clubbing. No calf tenderness or Homan's sign.  NEUROLOGIC: The patient is alert and oriented x 3. Cranial nerves II through XII are grossly intact with no focal sensorimotor  deficit. PSYCHIATRIC:  Normal affect, mood, thought content. SKIN: Warm, dry, and intact without obvious rash, lesion, or ulcer.    Labs on Admission:  CBC: No results for input(s): WBC, NEUTROABS, HGB, HCT, MCV, PLT in the last 168 hours. Basic Metabolic Panel: No results for input(s): NA, K, CL, CO2, GLUCOSE, BUN, CREATININE, CALCIUM, MG, PHOS in the last 168 hours. GFR: CrCl cannot be calculated (Patient's most recent lab result is older than the maximum 21 days allowed.). Liver Function Tests: No results for input(s): AST, ALT, ALKPHOS, BILITOT, PROT, ALBUMIN in the last 168 hours. No results for input(s): LIPASE, AMYLASE in the last 168 hours. No results for input(s): AMMONIA in the last 168 hours. Coagulation Profile: No results for input(s): INR, PROTIME in the last 168 hours. Cardiac Enzymes: No results for input(s): CKTOTAL, CKMB, CKMBINDEX, TROPONINI in the last 168 hours. BNP (last 3 results) No results for input(s): PROBNP in the last 8760 hours. HbA1C: No results for input(s): HGBA1C in the last 72 hours. CBG: No results for input(s): GLUCAP in the last 168 hours. Lipid Profile: No results for input(s): CHOL, HDL, LDLCALC, TRIG, CHOLHDL, LDLDIRECT in the last 72  hours. Thyroid Function Tests: No results for input(s): TSH, T4TOTAL, FREET4, T3FREE, THYROIDAB in the last 72 hours. Anemia Panel: No results for input(s): VITAMINB12, FOLATE, FERRITIN, TIBC, IRON, RETICCTPCT in the last 72 hours. Urine analysis:    Component Value Date/Time   COLORURINE YELLOW (A) 01/18/2016 1332   APPEARANCEUR HAZY (A) 01/18/2016 1332   APPEARANCEUR HAZY 06/03/2014 2028   LABSPEC 1.008 01/18/2016 1332   LABSPEC 1.005 06/03/2014 2028   PHURINE 9.0 (H) 01/18/2016 1332   GLUCOSEU >500 (A) 01/18/2016 1332   GLUCOSEU 500 mg/dL 06/03/2014 2028   HGBUR NEGATIVE 01/18/2016 1332   BILIRUBINUR NEGATIVE 01/18/2016 1332   BILIRUBINUR NEGATIVE 06/03/2014 2028   KETONESUR NEGATIVE 01/18/2016 1332   PROTEINUR 100 (A) 01/18/2016 1332   NITRITE NEGATIVE 01/18/2016 1332   LEUKOCYTESUR TRACE (A) 01/18/2016 1332   LEUKOCYTESUR NEGATIVE 06/03/2014 2028   Sepsis Labs: @LABRCNTIP (procalcitonin:4,lacticidven:4) )No results found for this or any previous visit (from the past 240 hour(s)).   Radiological Exams on Admission: Ct Foot Left Wo Contrast  Result Date: 06/18/2017 CLINICAL DATA:  Fall on bilateral feet. Multiple fractures. Nonweightbearing. EXAM: CT OF THE LEFT FOOT WITHOUT CONTRAST TECHNIQUE: Multidetector CT imaging of the left foot was performed according to the standard protocol. Multiplanar CT image reconstructions were also generated. COMPARISON:  Radiograph earlier this day. FINDINGS: Bones/Joint/Cartilage Fractures at the base of the second, third, and fourth metatarsals extended the tarsal metatarsal articulations. No significant displacement. There is no widening of the Lisfranc articulation. Small osseous density adjacent to the dorsal medial cuneiform has sclerotic margins and is likely chronic. The tarsal bones are otherwise intact. The digits appear intact. No tibiotalar joint effusion. Ligaments Suboptimally assessed by CT. Some Lisfranc ligamentous fibers are  visualized. Muscles and Tendons No acute abnormality. Soft tissues Soft tissue edema about the foot.  No confluent hematoma. IMPRESSION:  (LEFT FOOT): IMPRESSION:  (LEFT FOOT) 1. Fractures at the base of the second, third, and fourth metatarsals with intra-articular extension. No significant displacement. Lisfranc ligament is grossly intact. 2. Osseous density adjacent of the dorsal medial cuneiform has sclerotic margins and is likely chronic. Electronically Signed   By: Jeb Levering M.D.   On: 06/18/2017 22:05   Ct Foot Right Wo Contrast  Result Date: 06/18/2017 CLINICAL DATA:  Fall on bilateral feet. Multiple fractures. Nonweightbearing. EXAM:  CT OF THE RIGHT FOOT WITHOUT CONTRAST TECHNIQUE: Multidetector CT imaging of the right foot was performed according to the standard protocol. Multiplanar CT image reconstructions were also generated. COMPARISON:  Radiograph earlier this day. FINDINGS: Bones/Joint/Cartilage Fractures at the base of the first through fourth proximal metatarsals. First metatarsal fracture is tiny chip fracture about the medial cortex extending to the tarsal metatarsal joint. Second, third, and fourth proximal metatarsal fractures are mildly comminuted, nondisplaced and extending to the tarsal metatarsal joints. No widening of the Lisfranc articulation. Mildly displaced distal fibular fracture. Tarsal bones and digits are intact. Tibial talar joint is maintained. Small tibiotalar joint effusion. Ligaments Suboptimally assessed by CT. One Lisfranc ligamentous fibers are visualized. Muscles and Tendons No acute abnormality. Soft tissues Diffuse soft tissue edema of the foot. Dense vascular calcifications. IMPRESSION: 1. Fractures at the base of the first through fourth metatarsals. The second, third, and fourth metatarsal fractures are mildly comminuted, nondisplaced and intra-articular. First metatarsal fracture is a tiny chip fracture at the articular surface. Lisfranc ligament is  grossly intact. 2. Mildly displaced distal fibular fracture. Electronically Signed   By: Jeb Levering M.D.   On: 06/18/2017 22:12   Dg Foot Complete Left  Result Date: 06/18/2017 CLINICAL DATA:  Fall with bilateral foot pain EXAM: LEFT FOOT - COMPLETE 3+ VIEW COMPARISON:  None. FINDINGS: Acute nondisplaced fractures involving the proximal shafts of the third and fourth metatarsals. No significant angulation. Vascular calcifications. IMPRESSION: Acute nondisplaced fractures involving the proximal third and fourth metatarsal Electronically Signed   By: Donavan Foil M.D.   On: 06/18/2017 20:18   Dg Foot Complete Right  Result Date: 06/18/2017 CLINICAL DATA:  Bilateral foot pain EXAM: RIGHT FOOT COMPLETE - 3+ VIEW COMPARISON:  None. FINDINGS: No dislocation is evident. Small intra-articular fracture involving the base of the first metatarsal. Nondisplaced fractures involving the base of the second metatarsal. Linear lucency involving the proximal shaft of the fourth and possibly fifth metatarsals. IMPRESSION: 1. Small intra-articular fracture involving the base of the first metatarsal 2. Additional nondisplaced fractures involving the base of second metatarsal and proximal shaft of fourth metatarsal. Possible subtle nondisplaced fracture involving proximal shaft of fifth metatarsal. Electronically Signed   By: Donavan Foil M.D.   On: 06/18/2017 20:16    Assessment/Plan  This is a 36 y.o. female with a history of Bipolar disorder with significant cognitive impairment, combative and aggressive behavior, CKD, depression, diabetes, hypertension, hyperlipidemia  now being admitted with:  #. Intractable pain 2/2 multiple nondisplaced fractures of bilateral feet - Admit to inpatient - Pain control - PT eval - SW for consideration of SAR placement - DVT Px - Ortho consult  #. History of end-stage renal disease -Continue PhosLo, Sensipar - Nephro consult for HD  #. History of BPD -Continue  Depakote, Lexapro, Seroquel, trazodone  #. History of GERD - Continue Protonix  #. History of hypertension - Continue Norvasc, clonidine, hydralazine, Avapro, metoprolol  #. History of  Diabetes - Accuchecks achs with RISS coverage - Heart healthy, carb controlled diet  Admission status:  inpatient IV Fluids:  Hep-Lock Diet/Nutrition:  heart healthy, carb controlled Consults called:  Ortho,  nephrology, social work  DVT Px: Lovenox, SCDs and early ambulation. Code Status: Full Code  Disposition Plan:  to be determined  All the records are reviewed and case discussed with ED provider. Management plans discussed with the patient and/or family who express understanding and agree with plan of care.  Asmi Fugere D.O. on 06/18/2017 at 11:22  PM Between 7am to 6pm - Pager - [phone] After 6pm go to [email or web address] - Editor, commissioning [facility] [place] [facility] Office [phone] CC: Primary care physician; Remi Haggard, FNP   06/18/2017, 11:22 PM

## 2017-06-18 NOTE — ED Triage Notes (Signed)
Patient presents to the ED via EMS for bilateral foot pain.  Patient states her feet having been hurting since she "fell".  Staff with patient from group home and states patient has had difficulty bearing weight on her legs since about 4pm.  Staff denies known injury.  Patient is moaning loudly in triage.  No obvious deformity to feet.

## 2017-06-18 NOTE — ED Notes (Signed)
Staff at group home states they are notifying guardian that patient came to the ED for treatment.

## 2017-06-18 NOTE — ED Provider Notes (Signed)
[facility] Emergency Department Provider Note  ____________________________________________  Time seen: Approximately 9:41 PM  I have reviewed the triage vital signs and the nursing notes.   HISTORY  Chief Complaint Foot Pain    HPI Renie Stelmach is a 36 y.o. female who presents to emergency department with her guardian from the group home for complaint of bilateral foot pain. Exact mechanism of injury to bilateral feet is not exactly known. Patient has a significant history of cognitive impairment, bipolar disease, combative and aggressive behavior. Patient was with a group home on an outing on a group home bus when she visualized her "boyfriend". Group home staff suspect that patient injured her foot in an attempt to see her boyfriend. Patient is unwilling to bear weight on bilateral lower extremities. Patient is able to endorse pain bilaterally and denies any other pain complaints or other injuries. She is endorsing sharp, severe pain bilateral mid feet. No medications prior to arrival.  Patient has a history of chronic kidney disease, cognitive impairment, depression, bipolar disorder, hypoparathyroidism, hypertension. Patient has no other complaints at this time. Per group home staff, patient has been stable in regards to psychological disorders.   Past Medical History:  Diagnosis Date  . Alcohol abuse    chronic  . Bipolar disorder ([place])   . Chronic kidney disease   . Cognitive changes   . Cognitive impairment   . Depression   . Depression   . Diabetes mellitus without complication ([place])   . Elevated lipids   . Hyperparathyroidism ([place])   . Hypertension   . Staph aureus infection    A/V fistula  . UTI (lower urinary tract infection)     Patient Active Problem List   Diagnosis Date Noted  . 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 Hyperkalemia 06/25/2016  . Adjustment disorder with mixed disturbance of emotions and conduct   . Diabetes  ([place]) 04/23/2015  . Essential hypertension 04/23/2015  . ESRD on dialysis ([place]) 04/23/2015  . Pain of left arm 04/22/2015    Past Surgical History:  Procedure Laterality Date  . AV FISTULA PLACEMENT    . CHOLECYSTECTOMY    . dialysis perma cath Right    chest  . PERIPHERAL VASCULAR CATHETERIZATION N/A 05/24/2015   Procedure: Dialysis/Perma Catheter Removal;  Surgeon: Katha Cabal, MD;  Location: [place] [facility];  Service: Cardiovascular;  Laterality: N/A;  . PERIPHERAL VASCULAR CATHETERIZATION Left 02/28/2016   Procedure: A/V Shuntogram/Fistulagram;  Surgeon: Katha Cabal, MD;  Location: [place] [facility];  Service: Cardiovascular;  Laterality: Left;  . PERIPHERAL VASCULAR CATHETERIZATION N/A 02/28/2016   Procedure: A/V Shunt Intervention;  Surgeon: Katha Cabal, MD;  Location: [place] [facility];  Service: Cardiovascular;  Laterality: N/A;  . PERIPHERAL VASCULAR CATHETERIZATION N/A 06/26/2016   Procedure: A/V Shuntogram/Fistulagram;  Surgeon: Katha Cabal, MD;  Location: [place] [facility];  Service: Cardiovascular;  Laterality: N/A;  . PERIPHERAL VASCULAR CATHETERIZATION N/A 09/10/2016   Procedure: A/V Shuntogram/Fistulagram;  Surgeon: Algernon Huxley, MD;  Location: [place] [facility];  Service: Cardiovascular;  Laterality: N/A;  . PERIPHERAL VASCULAR CATHETERIZATION Left 09/14/2016   Procedure: Thrombectomy;  Surgeon: Katha Cabal, MD;  Location: [place] [facility];  Service: Cardiovascular;  Laterality: Left;  Marland Kitchen VASCULAR ACCESS DEVICE INSERTION Left 04/22/2015   Procedure: INSERTION OF HERO VASCULAR ACCESS DEVICE;  Surgeon: Katha Cabal, MD;  Location: [facility];  Service: Vascular;  Laterality: Left;    Prior to Admission  medications   Medication Sig Start Date End Date Taking? Authorizing Provider  acetaminophen (TYLENOL) 325 MG tablet Take 2 tablets (650 mg total) by mouth every 6 (six) hours as needed for mild pain, moderate pain or headache.  10/01/16   Clapacs, Madie Reno, MD  amLODipine (NORVASC) 10 MG tablet Take 10 mg by mouth at bedtime. *hold for systolic blood pressure <601, call MD if holding this medication.*    [provider]  amLODipine (NORVASC) 10 MG tablet Take 1 tablet (10 mg total) by mouth every evening. 10/01/16   Clapacs, Madie Reno, MD  aspirin 81 MG chewable tablet Chew 81 mg by mouth daily.    [provider]  aspirin 81 MG chewable tablet Chew 1 tablet (81 mg total) by mouth daily. 10/02/16   Clapacs, Madie Reno, MD  B Complex-C-Folic Acid (RENA-VITE RX) 1 MG TABS Take 1 tablet by mouth daily. 10/01/16   Clapacs, Madie Reno, MD  bacitracin ointment Apply to affected area twice daily 11/27/16 11/27/17  Hagler, Jami L, PA-C  calcium acetate (PHOSLO) 667 MG capsule Take 2 capsules (1,334 mg total) by mouth 2 (two) times daily with a meal. 10/01/16   Clapacs, Madie Reno, MD  cinacalcet (SENSIPAR) 60 MG tablet Take 1 tablet (60 mg total) by mouth daily. 10/01/16   Clapacs, Madie Reno, MD  cloNIDine (CATAPRES) 0.1 MG tablet Take 1 tablet (0.1 mg total) by mouth 2 (two) times daily. 10/01/16   Clapacs, Madie Reno, MD  cloNIDine (CATAPRES) 0.2 MG tablet Take 0.2 mg by mouth 2 (two) times daily.    [provider]  divalproex (DEPAKOTE) 500 MG DR tablet Take 1,000 mg by mouth at bedtime.     [provider]  divalproex (DEPAKOTE) 500 MG DR tablet Take 2 tablets (1,000 mg total) by mouth at bedtime. 10/01/16   Clapacs, Madie Reno, MD  docusate sodium (COLACE) 100 MG capsule Take 1 capsule (100 mg total) by mouth at bedtime. 10/01/16   Clapacs, Madie Reno, MD  escitalopram (LEXAPRO) 10 MG tablet Take 15 mg by mouth daily.    [provider]  escitalopram (LEXAPRO) 10 MG tablet Take 1.5 tablets (15 mg total) by mouth daily. 10/02/16   Clapacs, Madie Reno, MD  folic acid (FOLVITE) 1 MG tablet Take 2 tablets (2 mg total) by mouth at bedtime. 10/01/16   Clapacs, Madie Reno, MD  hydrALAZINE (APRESOLINE) 25 MG tablet Take 1 tablet (25 mg total)  by mouth 3 (three) times daily. 10/01/16   Clapacs, Madie Reno, MD  insulin aspart (NOVOLOG) 100 UNIT/ML injection Inject 0-12 Units into the skin 3 (three) times daily before meals. Per sliding scale    [provider]  insulin aspart (NOVOLOG) 100 UNIT/ML injection Inject 0-15 Units into the skin 3 (three) times daily with meals. 10/01/16   Clapacs, Madie Reno, MD  irbesartan (AVAPRO) 300 MG tablet Take 1 tablet (300 mg total) by mouth at bedtime. 10/01/16   Clapacs, Madie Reno, MD  lidocaine-prilocaine (EMLA) cream Apply 1 application topically 3 (three) times a week. Apply to access site 30 minutes before dialysis. 10/01/16   Clapacs, Madie Reno, MD  medroxyPROGESTERone (DEPO-PROVERA) 150 MG/ML injection Inject 150 mg into the muscle every 3 (three) months.    [provider]  metoprolol (LOPRESSOR) 50 MG tablet Take 50 mg by mouth 2 (two) times daily.    [provider]  metoprolol (LOPRESSOR) 50 MG tablet Take 1 tablet (50 mg total) by mouth 2 (two) times  daily. 10/01/16   Clapacs, Madie Reno, MD  Multiple Vitamin (THEREMS) TABS Take 1 tablet by mouth daily. 10/01/16   Clapacs, Madie Reno, MD  pantoprazole (PROTONIX) 20 MG tablet Take 1 tablet (20 mg total) by mouth daily. 04/20/17   Shary Decamp, PA-C  patiromer (VELTASSA) 8.4 g packet Take 1 packet (8.4 g total) by mouth daily. 10/01/16   Clapacs, Madie Reno, MD  QUEtiapine (SEROQUEL) 200 MG tablet Take 200 mg by mouth 2 (two) times daily.     [provider]  QUEtiapine (SEROQUEL) 200 MG tablet Take 1 tablet (200 mg total) by mouth 2 (two) times daily. 10/01/16   Clapacs, Madie Reno, MD  sulfamethoxazole-trimethoprim (BACTRIM DS,SEPTRA DS) 800-160 MG tablet Take 1 tablet by mouth 2 (two) times daily. 11/27/16   Hagler, Jami L, PA-C  traZODone (DESYREL) 100 MG tablet Take 100 mg by mouth at bedtime.    [provider]  traZODone (DESYREL) 100 MG tablet Take 1 tablet (100 mg total) by mouth at bedtime. 10/01/16   Clapacs, Madie Reno, MD  vitamin  B-12 (CYANOCOBALAMIN) 1000 MCG tablet Take 1 tablet (1,000 mcg total) by mouth at bedtime. 10/01/16   Clapacs, Madie Reno, MD    Allergies Patient has no known allergies.  No family history on file.  Social History Social History  Substance Use Topics  . Smoking status: Current Every Day Smoker    Packs/day: 0.25    Years: 0.00    Types: Cigarettes  . Smokeless tobacco: Current User  . Alcohol use No     Review of Systems  Constitutional: No fever/chills Eyes: No visual changes.  Cardiovascular: no chest pain. Respiratory: no cough. No SOB. Gastrointestinal: No abdominal pain.  No nausea, no vomiting.   Musculoskeletal: Positive for sharp bilateral foot pain. Skin: Negative for rash, abrasions, lacerations, ecchymosis. Neurological: Negative for headaches, focal weakness or numbness. 10-point ROS otherwise negative.  ____________________________________________   PHYSICAL EXAM:  VITAL SIGNS: ED Triage Vitals  Enc Vitals Group     BP 06/18/17 1855 122/79     Pulse Rate 06/18/17 1855 98     Resp 06/18/17 1855 18     Temp 06/18/17 1855 98.6 F (37 C)     Temp Source 06/18/17 1855 Oral     SpO2 06/18/17 1855 97 %     Weight 06/18/17 1856 187 lb (84.8 kg)     Height 06/18/17 1856 5' (1.524 m)     Head Circumference --      Peak Flow --      Pain Score --      Pain Loc --      Pain Edu? --      Excl. in [place]? --      Constitutional: Alert and oriented. Well appearing and in no acute distress. Eyes: Conjunctivae are normal. PERRL. EOMI. Head: Atraumatic.Normocephalic. ENT:      Ears:       Nose: No congestion/rhinnorhea.      Mouth/Throat: Mucous membranes are moist.  Neck: No stridor. Neck is supple with full range of motion  Cardiovascular: Normal rate, regular rhythm. Normal S1 and S2.  Good peripheral circulation. Respiratory: Normal respiratory effort without tachypnea or retractions. Lungs CTAB. Good air entry to the bases with no decreased or absent breath  sounds. Musculoskeletal: Patient is nonweightbearing and refuses to attempt to stand. No visible deformity or gross edema to bilateral feet. There is pedal edema bilaterally. Patient is able to move all digits appropriately. Sensation intact  5 digits. Patient reports sharp pain bilateral mid feet. No palpable abnormality. No other tenderness to palpation. Patient has full range of motion bilateral ankles. Saturation bilateral nares is unremarkable. Dorsalis pedis pulse intact bilateral lower extremities. Cap refill less than 2 seconds all digits. Neurologic:  Normal speech and language. No gross focal neurologic deficits are appreciated.  Skin:  Skin is warm, dry and intact. No rash noted. Psychiatric: Mood and affect are normal. Speech and behavior are normal. Patient exhibits appropriate insight and judgement.   ____________________________________________   LABS (all labs ordered are listed, but only abnormal results are displayed)  Labs Reviewed - No data to display ____________________________________________  EKG   ____________________________________________  RADIOLOGY Diamantina Providence Cuthriell, personally viewed and evaluated these images (plain radiographs) as part of my medical decision making, as well as reviewing the written report by the radiologist.  Ct Foot Left Wo Contrast  Result Date: 06/18/2017 CLINICAL DATA:  Fall on bilateral feet. Multiple fractures. Nonweightbearing. EXAM: CT OF THE LEFT FOOT WITHOUT CONTRAST TECHNIQUE: Multidetector CT imaging of the left foot was performed according to the standard protocol. Multiplanar CT image reconstructions were also generated. COMPARISON:  Radiograph earlier this day. FINDINGS: Bones/Joint/Cartilage Fractures at the base of the second, third, and fourth metatarsals extended the tarsal metatarsal articulations. No significant displacement. There is no widening of the Lisfranc articulation. Small osseous density adjacent to the  dorsal medial cuneiform has sclerotic margins and is likely chronic. The tarsal bones are otherwise intact. The digits appear intact. No tibiotalar joint effusion. Ligaments Suboptimally assessed by CT. Some Lisfranc ligamentous fibers are visualized. Muscles and Tendons No acute abnormality. Soft tissues Soft tissue edema about the foot.  No confluent hematoma. IMPRESSION:  (LEFT FOOT): IMPRESSION:  (LEFT FOOT) 1. Fractures at the base of the second, third, and fourth metatarsals with intra-articular extension. No significant displacement. Lisfranc ligament is grossly intact. 2. Osseous density adjacent of the dorsal medial cuneiform has sclerotic margins and is likely chronic. Electronically Signed   By: Jeb Levering M.D.   On: 06/18/2017 22:05   Ct Foot Right Wo Contrast  Result Date: 06/18/2017 CLINICAL DATA:  Fall on bilateral feet. Multiple fractures. Nonweightbearing. EXAM: CT OF THE RIGHT FOOT WITHOUT CONTRAST TECHNIQUE: Multidetector CT imaging of the right foot was performed according to the standard protocol. Multiplanar CT image reconstructions were also generated. COMPARISON:  Radiograph earlier this day. FINDINGS: Bones/Joint/Cartilage Fractures at the base of the first through fourth proximal metatarsals. First metatarsal fracture is tiny chip fracture about the medial cortex extending to the tarsal metatarsal joint. Second, third, and fourth proximal metatarsal fractures are mildly comminuted, nondisplaced and extending to the tarsal metatarsal joints. No widening of the Lisfranc articulation. Mildly displaced distal fibular fracture. Tarsal bones and digits are intact. Tibial talar joint is maintained. Small tibiotalar joint effusion. Ligaments Suboptimally assessed by CT. One Lisfranc ligamentous fibers are visualized. Muscles and Tendons No acute abnormality. Soft tissues Diffuse soft tissue edema of the foot. Dense vascular calcifications. IMPRESSION: 1. Fractures at the base of the first  through fourth metatarsals. The second, third, and fourth metatarsal fractures are mildly comminuted, nondisplaced and intra-articular. First metatarsal fracture is a tiny chip fracture at the articular surface. Lisfranc ligament is grossly intact. 2. Mildly displaced distal fibular fracture. Electronically Signed   By: Jeb Levering M.D.   On: 06/18/2017 22:12   Dg Foot Complete Left  Result Date: 06/18/2017 CLINICAL DATA:  Fall with bilateral foot pain EXAM: LEFT FOOT -  COMPLETE 3+ VIEW COMPARISON:  None. FINDINGS: Acute nondisplaced fractures involving the proximal shafts of the third and fourth metatarsals. No significant angulation. Vascular calcifications. IMPRESSION: Acute nondisplaced fractures involving the proximal third and fourth metatarsal Electronically Signed   By: Donavan Foil M.D.   On: 06/18/2017 20:18   Dg Foot Complete Right  Result Date: 06/18/2017 CLINICAL DATA:  Bilateral foot pain EXAM: RIGHT FOOT COMPLETE - 3+ VIEW COMPARISON:  None. FINDINGS: No dislocation is evident. Small intra-articular fracture involving the base of the first metatarsal. Nondisplaced fractures involving the base of the second metatarsal. Linear lucency involving the proximal shaft of the fourth and possibly fifth metatarsals. IMPRESSION: 1. Small intra-articular fracture involving the base of the first metatarsal 2. Additional nondisplaced fractures involving the base of second metatarsal and proximal shaft of fourth metatarsal. Possible subtle nondisplaced fracture involving proximal shaft of fifth metatarsal. Electronically Signed   By: Donavan Foil M.D.   On: 06/18/2017 20:16    ____________________________________________    PROCEDURES  Procedure(s) performed:    Procedures    Medications  morphine 4 MG/ML injection 4 mg (not administered)  ondansetron (ZOFRAN) injection 4 mg (not administered)  HYDROcodone-acetaminophen (NORCO/VICODIN) 5-325 MG per tablet 1 tablet (1 tablet Oral  Given 06/18/17 2144)     ____________________________________________   INITIAL IMPRESSION / ASSESSMENT AND PLAN / ED COURSE  Pertinent labs & imaging results that were available during my care of the patient were reviewed by me and considered in my medical decision making (see chart for details).  Review of the [place] CSRS was performed in accordance of the [place] prior to dispensing any controlled drugs.     Patient's diagnosis is consistent with bilateral foot fractures with multiple metatarsal bones bilaterally. Patient also has a fibular fracture to the right lower extremity. Initial concern was patient may have sustained Lisfranc injury. CT scans were ordered bilaterally. These returned with intact Lisfranc ligaments. At this time, patient is completely nonweightbearing bilaterally. She is a group home resident and group home states that they're unable to manage patient. At this time, patient wouldd be a good candidate for admission. I discussed the patient's case with hospitalist who agrees to see patient for admission for pain management and rehabilitation placement. Patient care is turned over to hospitalist service at this time.   ____________________________________________  FINAL CLINICAL IMPRESSION(S) / ED DIAGNOSES  Final diagnoses:  Closed nondisplaced fracture of first metatarsal bone of right foot, initial encounter  Closed nondisplaced fracture of second metatarsal bone of right foot, initial encounter  Closed nondisplaced fracture of third metatarsal bone of right foot, initial encounter  Closed nondisplaced fracture of fourth metatarsal bone of right foot, initial encounter  Closed fracture of distal end of right fibula, unspecified fracture morphology, initial encounter  Closed nondisplaced fracture of first metatarsal bone of left foot, initial encounter  Closed nondisplaced fracture of second metatarsal bone of left foot, initial encounter  Closed nondisplaced fracture  of third metatarsal bone of left foot, initial encounter  Closed nondisplaced fracture of fourth metatarsal bone of left foot, initial encounter      NEW MEDICATIONS STARTED DURING THIS VISIT:  New Prescriptions   No medications on file        This chart was dictated using voice recognition software/Dragon. Despite best efforts to proofread, errors can occur which can change the meaning. Any change was purely unintentional.    Darletta Moll, PA-C 06/18/17 2344    Lisa Roca, MD 06/24/17 [phone]

## 2017-06-19 LAB — RENAL FUNCTION PANEL
ANION GAP: 15 (ref 5–15)
Albumin: 3.5 g/dL (ref 3.5–5.0)
BUN: 58 mg/dL — ABNORMAL HIGH (ref 6–20)
CHLORIDE: 95 mmol/L — AB (ref 101–111)
CO2: 27 mmol/L (ref 22–32)
Calcium: 9.3 mg/dL (ref 8.9–10.3)
Creatinine, Ser: 12.07 mg/dL — ABNORMAL HIGH (ref 0.44–1.00)
GFR, EST AFRICAN AMERICAN: 4 mL/min — AB (ref >60)
GFR, EST NON AFRICAN AMERICAN: 4 mL/min — AB (ref >60)
Glucose, Bld: 144 mg/dL — ABNORMAL HIGH (ref 65–99)
POTASSIUM: 4.5 mmol/L (ref 3.5–5.1)
Phosphorus: 4 mg/dL (ref 2.5–4.6)
Sodium: 137 mmol/L (ref 135–145)

## 2017-06-19 LAB — CBC
HEMATOCRIT: 31.9 % — AB (ref 35.0–47.0)
Hemoglobin: 10.8 g/dL — ABNORMAL LOW (ref 12.0–16.0)
MCH: 34.1 pg — ABNORMAL HIGH (ref 26.0–34.0)
MCHC: 33.8 g/dL (ref 32.0–36.0)
MCV: 101.1 fL — AB (ref 80.0–100.0)
Platelets: 192 10*3/uL (ref 150–440)
RBC: 3.15 MIL/uL — AB (ref 3.80–5.20)
RDW: 16.5 % — ABNORMAL HIGH (ref 11.5–14.5)
WBC: 6.6 10*3/uL (ref 3.6–11.0)

## 2017-06-19 LAB — GLUCOSE, CAPILLARY
GLUCOSE-CAPILLARY: 153 mg/dL — AB (ref 65–99)
GLUCOSE-CAPILLARY: 186 mg/dL — AB (ref 65–99)
Glucose-Capillary: 157 mg/dL — ABNORMAL HIGH (ref 65–99)
Glucose-Capillary: 114 mg/dL — ABNORMAL HIGH (ref 65–99)
Glucose-Capillary: 207 mg/dL — ABNORMAL HIGH (ref 65–99)

## 2017-06-19 LAB — PHOSPHORUS: PHOSPHORUS: 4.3 mg/dL (ref 2.5–4.6)

## 2017-06-19 LAB — MAGNESIUM: Magnesium: 2.3 mg/dL (ref 1.7–2.4)

## 2017-06-19 MED ORDER — PANTOPRAZOLE SODIUM 20 MG PO TBEC: 20.0000 mg | DELAYED_RELEASE_TABLET | Freq: Every day | ORAL | Status: DC

## 2017-06-19 MED ORDER — VITAMIN B-12 1000 MCG PO TABS
1000.0000 ug | ORAL_TABLET | Freq: Every day | ORAL | Status: DC
  Administered 2017-06-19 – 2017-06-30 (×10): 1000 ug via ORAL
  Filled 2017-06-19 (×12): qty 1

## 2017-06-19 MED ORDER — QUETIAPINE FUMARATE 200 MG PO TABS
200.0000 mg | ORAL_TABLET | Freq: Two times a day (BID) | ORAL | Status: DC
Start: 2017-06-19 — End: 2017-07-01
  Administered 2017-06-19 – 2017-06-30 (×20): 200 mg via ORAL
  Filled 2017-06-19 (×28): qty 1

## 2017-06-19 MED ORDER — TRAZODONE HCL 100 MG PO TABS
100.0000 mg | ORAL_TABLET | Freq: Every day | ORAL | Status: DC
Start: 2017-06-19 — End: 2017-07-01
  Administered 2017-06-19 – 2017-06-30 (×11): 100 mg via ORAL
  Filled 2017-06-19 (×15): qty 1

## 2017-06-19 MED ORDER — ASPIRIN 81 MG PO CHEW
81.0000 mg | CHEWABLE_TABLET | Freq: Every day | ORAL | Status: DC
  Administered 2017-06-19 – 2017-06-29 (×7): 81 mg via ORAL
  Filled 2017-06-19 (×10): qty 1

## 2017-06-19 MED ORDER — AMLODIPINE BESYLATE 10 MG PO TABS
10.0000 mg | ORAL_TABLET | Freq: Every evening | ORAL | Status: DC
  Administered 2017-06-19 – 2017-06-30 (×10): 10 mg via ORAL
  Filled 2017-06-19 (×11): qty 1

## 2017-06-19 MED ORDER — IPRATROPIUM BROMIDE 0.02 % IN SOLN: 0.5000 mg | Freq: Four times a day (QID) | RESPIRATORY_TRACT | Status: DC | PRN

## 2017-06-19 MED ORDER — HEPARIN SODIUM (PORCINE) 5000 UNIT/ML IJ SOLN
5000.0000 [IU] | Freq: Three times a day (TID) | INTRAMUSCULAR | Status: DC
  Administered 2017-06-19 – 2017-06-30 (×24): 5000 [IU] via SUBCUTANEOUS
  Filled 2017-06-19 (×27): qty 1

## 2017-06-19 MED ORDER — ONDANSETRON HCL 4 MG/2ML IJ SOLN: 4.0000 mg | Freq: Four times a day (QID) | INTRAMUSCULAR | Status: DC | PRN

## 2017-06-19 MED ORDER — METOPROLOL TARTRATE 50 MG PO TABS
50.0000 mg | ORAL_TABLET | Freq: Two times a day (BID) | ORAL | Status: DC
  Administered 2017-06-19 – 2017-06-30 (×16): 50 mg via ORAL
  Filled 2017-06-19 (×20): qty 1

## 2017-06-19 MED ORDER — MORPHINE SULFATE (PF) 2 MG/ML IV SOLN
1.0000 mg | INTRAVENOUS | Status: DC | PRN
  Administered 2017-06-19: 1 mg via INTRAVENOUS
  Filled 2017-06-19: qty 1

## 2017-06-19 MED ORDER — FOLIC ACID 1 MG PO TABS
2.0000 mg | ORAL_TABLET | Freq: Every day | ORAL | Status: DC
  Administered 2017-06-19 – 2017-06-30 (×11): 2 mg via ORAL
  Filled 2017-06-19 (×13): qty 2

## 2017-06-19 MED ORDER — INSULIN ASPART 100 UNIT/ML ~~LOC~~ SOLN
0.0000 [IU] | Freq: Every day | SUBCUTANEOUS | Status: DC
  Administered 2017-06-21: 3 [IU] via SUBCUTANEOUS
  Administered 2017-06-24: 2 [IU] via SUBCUTANEOUS
  Administered 2017-06-26: 4 [IU] via SUBCUTANEOUS
  Administered 2017-06-29: 2 [IU] via SUBCUTANEOUS
  Administered 2017-06-30: 4 [IU] via SUBCUTANEOUS
  Filled 2017-06-19 (×5): qty 1

## 2017-06-19 MED ORDER — OXYCODONE HCL 5 MG PO TABS
5.0000 mg | ORAL_TABLET | ORAL | Status: DC | PRN
  Administered 2017-06-19 – 2017-06-30 (×9): 5 mg via ORAL
  Filled 2017-06-19 (×11): qty 1

## 2017-06-19 MED ORDER — INSULIN ASPART 100 UNIT/ML ~~LOC~~ SOLN
0.0000 [IU] | Freq: Three times a day (TID) | SUBCUTANEOUS | Status: DC
  Administered 2017-06-19: 5 [IU] via SUBCUTANEOUS
  Administered 2017-06-19: 3 [IU] via SUBCUTANEOUS
  Administered 2017-06-20: 5 [IU] via SUBCUTANEOUS
  Administered 2017-06-20: 2 [IU] via SUBCUTANEOUS
  Administered 2017-06-20: 3 [IU] via SUBCUTANEOUS
  Administered 2017-06-21: 2 [IU] via SUBCUTANEOUS
  Administered 2017-06-21: 5 [IU] via SUBCUTANEOUS
  Administered 2017-06-21 – 2017-06-23 (×5): 3 [IU] via SUBCUTANEOUS
  Administered 2017-06-24: 5 [IU] via SUBCUTANEOUS
  Administered 2017-06-24: 3 [IU] via SUBCUTANEOUS
  Administered 2017-06-25: 5 [IU] via SUBCUTANEOUS
  Administered 2017-06-25: 3 [IU] via SUBCUTANEOUS
  Administered 2017-06-26: 8 [IU] via SUBCUTANEOUS
  Administered 2017-06-26 – 2017-06-27 (×2): 3 [IU] via SUBCUTANEOUS
  Administered 2017-06-27: 8 [IU] via SUBCUTANEOUS
  Administered 2017-06-27: 5 [IU] via SUBCUTANEOUS
  Administered 2017-06-28: 15 [IU] via SUBCUTANEOUS
  Administered 2017-06-28: 2 [IU] via SUBCUTANEOUS
  Administered 2017-06-29: 11 [IU] via SUBCUTANEOUS
  Administered 2017-06-29: 5 [IU] via SUBCUTANEOUS
  Administered 2017-06-30: 11 [IU] via SUBCUTANEOUS
  Administered 2017-07-01: 8 [IU] via SUBCUTANEOUS
  Filled 2017-06-19 (×29): qty 1

## 2017-06-19 MED ORDER — BISACODYL 5 MG PO TBEC
5.0000 mg | DELAYED_RELEASE_TABLET | Freq: Every day | ORAL | Status: DC | PRN
  Administered 2017-06-21: 5 mg via ORAL
  Filled 2017-06-19 (×2): qty 1

## 2017-06-19 MED ORDER — ACETAMINOPHEN 650 MG RE SUPP: 650.0000 mg | Freq: Four times a day (QID) | RECTAL | Status: DC | PRN

## 2017-06-19 MED ORDER — RENA-VITE PO TABS
1.0000 | ORAL_TABLET | Freq: Every day | ORAL | Status: DC
  Administered 2017-06-19 – 2017-06-29 (×7): 1 via ORAL
  Filled 2017-06-19 (×13): qty 1

## 2017-06-19 MED ORDER — ONDANSETRON HCL 4 MG PO TABS: 4.0000 mg | ORAL_TABLET | Freq: Four times a day (QID) | ORAL | Status: DC | PRN

## 2017-06-19 MED ORDER — CALCIUM ACETATE (PHOS BINDER) 667 MG PO CAPS
1334.0000 mg | ORAL_CAPSULE | Freq: Two times a day (BID) | ORAL | Status: DC
  Administered 2017-06-19 – 2017-06-29 (×16): 1334 mg via ORAL
  Filled 2017-06-19 (×26): qty 2

## 2017-06-19 MED ORDER — DIVALPROEX SODIUM 500 MG PO DR TAB
1000.0000 mg | DELAYED_RELEASE_TABLET | Freq: Every day | ORAL | Status: DC
  Administered 2017-06-19 – 2017-06-30 (×11): 1000 mg via ORAL
  Filled 2017-06-19 (×15): qty 2

## 2017-06-19 MED ORDER — DOCUSATE SODIUM 100 MG PO CAPS
100.0000 mg | ORAL_CAPSULE | Freq: Every day | ORAL | Status: DC
  Administered 2017-06-19 – 2017-06-30 (×11): 100 mg via ORAL
  Filled 2017-06-19 (×12): qty 1

## 2017-06-19 MED ORDER — MAGNESIUM CITRATE PO SOLN
1.0000 | Freq: Once | ORAL | Status: DC | PRN
  Filled 2017-06-19: qty 296

## 2017-06-19 MED ORDER — IRBESARTAN 150 MG PO TABS
300.0000 mg | ORAL_TABLET | Freq: Every day | ORAL | Status: DC
  Administered 2017-06-19 – 2017-06-24 (×6): 300 mg via ORAL
  Filled 2017-06-19 (×7): qty 2

## 2017-06-19 MED ORDER — ACETAMINOPHEN 325 MG PO TABS
650.0000 mg | ORAL_TABLET | Freq: Four times a day (QID) | ORAL | Status: DC | PRN
  Administered 2017-06-25: 650 mg via ORAL

## 2017-06-19 MED ORDER — CINACALCET HCL 30 MG PO TABS
60.0000 mg | ORAL_TABLET | Freq: Every day | ORAL | Status: DC
  Administered 2017-06-19 – 2017-06-29 (×8): 60 mg via ORAL
  Filled 2017-06-19 (×13): qty 2

## 2017-06-19 MED ORDER — ALBUTEROL SULFATE (2.5 MG/3ML) 0.083% IN NEBU: 2.5000 mg | INHALATION_SOLUTION | Freq: Four times a day (QID) | RESPIRATORY_TRACT | Status: DC | PRN

## 2017-06-19 MED ORDER — ESCITALOPRAM OXALATE 10 MG PO TABS
15.0000 mg | ORAL_TABLET | Freq: Every day | ORAL | Status: DC
Start: 2017-06-19 — End: 2017-07-01
  Administered 2017-06-19 – 2017-06-29 (×9): 15 mg via ORAL
  Filled 2017-06-19 (×13): qty 1.5

## 2017-06-19 MED ORDER — ADULT MULTIVITAMIN W/MINERALS CH
1.0000 | ORAL_TABLET | Freq: Every day | ORAL | Status: DC
  Administered 2017-06-19 – 2017-06-26 (×6): 1 via ORAL
  Filled 2017-06-19 (×7): qty 1

## 2017-06-19 MED ORDER — SENNOSIDES-DOCUSATE SODIUM 8.6-50 MG PO TABS
1.0000 | ORAL_TABLET | Freq: Every evening | ORAL | Status: DC | PRN
  Administered 2017-06-21: 1 via ORAL
  Filled 2017-06-19: qty 1

## 2017-06-19 MED ORDER — PANTOPRAZOLE SODIUM 40 MG PO TBEC
40.0000 mg | DELAYED_RELEASE_TABLET | Freq: Every day | ORAL | Status: DC
  Administered 2017-06-19 – 2017-06-29 (×8): 40 mg via ORAL
  Filled 2017-06-19 (×10): qty 1

## 2017-06-19 MED ORDER — PATIROMER SORBITEX CALCIUM 8.4 G PO PACK
8.4000 g | PACK | ORAL | Status: DC
  Filled 2017-06-19 (×13): qty 4

## 2017-06-19 MED ORDER — CLONIDINE HCL 0.1 MG PO TABS
0.1000 mg | ORAL_TABLET | Freq: Two times a day (BID) | ORAL | Status: DC
  Administered 2017-06-19 – 2017-06-24 (×8): 0.1 mg via ORAL
  Filled 2017-06-19 (×9): qty 1

## 2017-06-19 MED ORDER — HYDRALAZINE HCL 25 MG PO TABS
25.0000 mg | ORAL_TABLET | Freq: Three times a day (TID) | ORAL | Status: DC
  Administered 2017-06-19 – 2017-06-30 (×22): 25 mg via ORAL
  Filled 2017-06-19 (×24): qty 1

## 2017-06-19 NOTE — Progress Notes (Signed)
Pre dialysis assessment. Patient in no distress. Eating candy

## 2017-06-19 NOTE — Progress Notes (Signed)
[facility]  ROUNDING NOTE   Subjective:   [name] admitted to [facility] for Closed nondisplaced fracture of fourth metatarsal bone of left foot, initial encounter [S92.345A] Closed nondisplaced fracture of third metatarsal bone of left foot, initial encounter [S92.335A] Closed nondisplaced fracture of third metatarsal bone of right foot, initial encounter [S92.334A] Closed nondisplaced fracture of fourth metatarsal bone of right foot, initial encounter [S92.344A] Closed nondisplaced fracture of second metatarsal bone of left foot, initial encounter [S92.325A] Closed nondisplaced fracture of second metatarsal bone of right foot, initial encounter [S92.324A] Closed nondisplaced fracture of first metatarsal bone of left foot, initial encounter [S92.315A] Closed fracture of distal end of right fibula, unspecified fracture morphology, initial encounter [J18.841Y] Closed nondisplaced fracture of first metatarsal bone of right foot, initial encounter [S92.314A]   Dialysis for today. She has refused dialysis so far.   Objective:  Vital signs in last 24 hours:  Temp:  [98.2 F (36.8 C)-99 F (37.2 C)] 98.2 F (36.8 C) (07/25 1022) Pulse Rate:  [87-108] 100 (07/25 1022) Resp:  [16-20] 19 (07/25 1022) BP: (122-139)/(55-79) 136/76 (07/25 1022) SpO2:  [96 %-98 %] 96 % (07/25 1022) Weight:  [84.8 kg (187 lb)-87.8 kg (193 lb 9 oz)] 87.8 kg (193 lb 9 oz) (07/25 1022)  Weight change:  Filed Weights   06/18/17 1856 06/19/17 0035 06/19/17 1022  Weight: 84.8 kg (187 lb) 87.6 kg (193 lb 1.6 oz) 87.8 kg (193 lb 9 oz)    Intake/Output: No intake/output data recorded.   Intake/Output this shift:  No intake/output data recorded.  Physical Exam: General: NAD,   Head: Normocephalic, atraumatic. Moist oral mucosal membranes  Eyes: Anicteric, PERRL  Neck: Supple, trachea midline  Lungs:  Clear to auscultation  Heart: Regular rate and rhythm  Abdomen:  Soft, nontender,    Extremities:  1+ peripheral edema.  Neurologic: Nonfocal, moving all four extremities  Skin: No lesions  Access: Left AVF    Basic Metabolic Panel:  Recent Labs Lab 06/19/17 0729  MG 2.3  PHOS 4.3    Liver Function Tests: No results for input(s): AST, ALT, ALKPHOS, BILITOT, PROT, ALBUMIN in the last 168 hours. No results for input(s): LIPASE, AMYLASE in the last 168 hours. No results for input(s): AMMONIA in the last 168 hours.  CBC: No results for input(s): WBC, NEUTROABS, HGB, HCT, MCV, PLT in the last 168 hours.  Cardiac Enzymes: No results for input(s): CKTOTAL, CKMB, CKMBINDEX, TROPONINI in the last 168 hours.  BNP: Invalid input(s): POCBNP  CBG:  Recent Labs Lab 06/19/17 0033 06/19/17 0753 06/19/17 1150  GLUCAP 153* 157* 114*    Microbiology: Results for orders placed or performed during the hospital encounter of 06/11/16  Culture, blood (routine x 2)     Status: None   Collection Time: 06/15/16  4:20 PM  Result Value Ref Range Status   Specimen Description BLOOD ARTERIAL LINE  Final   Special Requests   Final    BOTTLES DRAWN AEROBIC AND ANAEROBIC  AER [place] ANA 5CC   Culture NO GROWTH 5 DAYS  Final   Report Status 06/20/2016 FINAL  Final  Culture, blood (routine x 2)     Status: None   Collection Time: 06/15/16  4:20 PM  Result Value Ref Range Status   Specimen Description BLOOD DRAWN BY DIALYSIS  Final   Special Requests BOTTLES DRAWN AEROBIC AND ANAEROBIC  5CC  Final   Culture NO GROWTH 5 DAYS  Final   Report Status 06/20/2016 FINAL  Final    Coagulation Studies: No results for input(s): LABPROT, INR in the last 72 hours.  Urinalysis: No results for input(s): COLORURINE, LABSPEC, PHURINE, GLUCOSEU, HGBUR, BILIRUBINUR, KETONESUR, PROTEINUR, UROBILINOGEN, NITRITE, LEUKOCYTESUR in the last 72 hours.  Invalid input(s): APPERANCEUR    Imaging: Ct Foot Left Wo Contrast  Result Date: 06/18/2017 CLINICAL DATA:  Fall on bilateral feet. Multiple  fractures. Nonweightbearing. EXAM: CT OF THE LEFT FOOT WITHOUT CONTRAST TECHNIQUE: Multidetector CT imaging of the left foot was performed according to the standard protocol. Multiplanar CT image reconstructions were also generated. COMPARISON:  Radiograph earlier this day. FINDINGS: Bones/Joint/Cartilage Fractures at the base of the second, third, and fourth metatarsals extended the tarsal metatarsal articulations. No significant displacement. There is no widening of the Lisfranc articulation. Small osseous density adjacent to the dorsal medial cuneiform has sclerotic margins and is likely chronic. The tarsal bones are otherwise intact. The digits appear intact. No tibiotalar joint effusion. Ligaments Suboptimally assessed by CT. Some Lisfranc ligamentous fibers are visualized. Muscles and Tendons No acute abnormality. Soft tissues Soft tissue edema about the foot.  No confluent hematoma. IMPRESSION:  (LEFT FOOT): IMPRESSION:  (LEFT FOOT) 1. Fractures at the base of the second, third, and fourth metatarsals with intra-articular extension. No significant displacement. Lisfranc ligament is grossly intact. 2. Osseous density adjacent of the dorsal medial cuneiform has sclerotic margins and is likely chronic. Electronically Signed   By: Jeb Levering M.D.   On: 06/18/2017 22:05   Ct Foot Right Wo Contrast  Result Date: 06/18/2017 CLINICAL DATA:  Fall on bilateral feet. Multiple fractures. Nonweightbearing. EXAM: CT OF THE RIGHT FOOT WITHOUT CONTRAST TECHNIQUE: Multidetector CT imaging of the right foot was performed according to the standard protocol. Multiplanar CT image reconstructions were also generated. COMPARISON:  Radiograph earlier this day. FINDINGS: Bones/Joint/Cartilage Fractures at the base of the first through fourth proximal metatarsals. First metatarsal fracture is tiny chip fracture about the medial cortex extending to the tarsal metatarsal joint. Second, third, and fourth proximal metatarsal  fractures are mildly comminuted, nondisplaced and extending to the tarsal metatarsal joints. No widening of the Lisfranc articulation. Mildly displaced distal fibular fracture. Tarsal bones and digits are intact. Tibial talar joint is maintained. Small tibiotalar joint effusion. Ligaments Suboptimally assessed by CT. One Lisfranc ligamentous fibers are visualized. Muscles and Tendons No acute abnormality. Soft tissues Diffuse soft tissue edema of the foot. Dense vascular calcifications. IMPRESSION: 1. Fractures at the base of the first through fourth metatarsals. The second, third, and fourth metatarsal fractures are mildly comminuted, nondisplaced and intra-articular. First metatarsal fracture is a tiny chip fracture at the articular surface. Lisfranc ligament is grossly intact. 2. Mildly displaced distal fibular fracture. Electronically Signed   By: Jeb Levering M.D.   On: 06/18/2017 22:12   Dg Foot Complete Left  Result Date: 06/18/2017 CLINICAL DATA:  Fall with bilateral foot pain EXAM: LEFT FOOT - COMPLETE 3+ VIEW COMPARISON:  None. FINDINGS: Acute nondisplaced fractures involving the proximal shafts of the third and fourth metatarsals. No significant angulation. Vascular calcifications. IMPRESSION: Acute nondisplaced fractures involving the proximal third and fourth metatarsal Electronically Signed   By: Donavan Foil M.D.   On: 06/18/2017 20:18   Dg Foot Complete Right  Result Date: 06/18/2017 CLINICAL DATA:  Bilateral foot pain EXAM: RIGHT FOOT COMPLETE - 3+ VIEW COMPARISON:  None. FINDINGS: No dislocation is evident. Small intra-articular fracture involving the base of the first metatarsal. Nondisplaced fractures involving the base of the second metatarsal. Linear lucency involving  the proximal shaft of the fourth and possibly fifth metatarsals. IMPRESSION: 1. Small intra-articular fracture involving the base of the first metatarsal 2. Additional nondisplaced fractures involving the base of  second metatarsal and proximal shaft of fourth metatarsal. Possible subtle nondisplaced fracture involving proximal shaft of fifth metatarsal. Electronically Signed   By: Donavan Foil M.D.   On: 06/18/2017 20:16     Medications:    . amLODipine  10 mg Oral QPM  . aspirin  81 mg Oral Daily  . calcium acetate  1,334 mg Oral BID WC  . cinacalcet  60 mg Oral Daily  . cloNIDine  0.1 mg Oral BID  . divalproex  1,000 mg Oral QHS  . docusate sodium  100 mg Oral QHS  . escitalopram  15 mg Oral Daily  . folic acid  2 mg Oral QHS  . heparin  5,000 Units Subcutaneous Q8H  . hydrALAZINE  25 mg Oral TID  . insulin aspart  0-15 Units Subcutaneous TID WC  . insulin aspart  0-5 Units Subcutaneous QHS  . irbesartan  300 mg Oral QHS  . metoprolol tartrate  50 mg Oral BID  . multivitamin  1 tablet Oral Daily  . multivitamin with minerals  1 tablet Oral Daily  . pantoprazole  40 mg Oral Daily  . patiromer  8.4 g Oral Q24H  . QUEtiapine  200 mg Oral BID  . traZODone  100 mg Oral QHS  . vitamin B-12  1,000 mcg Oral QHS   acetaminophen **OR** acetaminophen, albuterol, bisacodyl, ipratropium, magnesium citrate, morphine injection, ondansetron **OR** ondansetron (ZOFRAN) IV, oxyCODONE, senna-docusate  Assessment/ Plan:  Kelly [name] is a 36 y.o. black female  with developmental delay, ESRD on hemodialysis, anemia, hypertension, schizophrenia, diabetes mellitus type II  [facility] MWF [address]  1. End Stage Renal Disease: on hemodialysis MWF schedule.  - Dialysis for today. Orders prepared. If patient refuses, then will plan on next treatment for Friday.   2. Hypertension: well controlled.  - Continue Amlodipine, clonidine, hydralazine, irbesartan, metoprolol  3. Anemia of chronic kidney disease  - Check CBC - Continue Epogen with dialysis.  4. Secondary Hyperparathyroidism: Patient has not required Binder therapy   LOS: 1 Kelly [name] 7/25/20181:30 PM

## 2017-06-19 NOTE — Progress Notes (Signed)
Patient agreed to come to dialysis after talking with MD.   Pre HD

## 2017-06-19 NOTE — Progress Notes (Signed)
HD initiated via L AVG without issue using 15g needles x2. Patient currently has no complaints.

## 2017-06-19 NOTE — Progress Notes (Signed)
  End of hd 

## 2017-06-19 NOTE — Progress Notes (Signed)
Pt alert and oriented. Uncooperative with staff this shift refusing to have labs drawn or be swabbed for MRSA. Md on call aware.  Pt dialysis patient and states that she does not make urine. Dialysis Monday, Wednesday and Friday. Pt has been able to sleep in between care.

## 2017-06-19 NOTE — Care Management Note (Addendum)
Case Management Note  Patient Details  Name: Kelly [name] MRN: [id number] Date of Birth: [birth year]  Subjective/Objective:   Admitted from [facility]  grp home with multiple bilateral toe fractures. Not a surgical candidate. Can not walk. Combative and uncooperative. Psych history. PT evaluation pending. Dialysis patient. [place], Notified [name] with [name].  CSW made aware. Following.                Action/Plan:   Expected Discharge Date:  06/21/17               Expected Discharge Plan:     In-House Referral:  Clinical Social Work  Discharge planning Services  CM Consult  Post Acute Care Choice:    Choice offered to:     DME Arranged:    DME Agency:     HH Arranged:    HH Agency:     Status of Service:  In process, will continue to follow  If discussed at Long Length of Stay Meetings, dates discussed:    Additional Comments:  Jolly Mango, RN 06/19/2017, 8:35 AM

## 2017-06-19 NOTE — Progress Notes (Signed)
Post hd vitals 

## 2017-06-19 NOTE — Progress Notes (Signed)
Pt requesting to sign off HD. [place] MD aware. Will end tx.

## 2017-06-19 NOTE — Progress Notes (Signed)
Patient brought to HD unit. Placed on telemetry, vitals taken and patient assessed. Signed consents viewed in chart. When attempting to initiate HD patient states "I dont want to do this." Patient would not give a reason, just shakes head. Educated on the importance of receiving her dialysis three times a week. Patient still refuses. Primary RN notified. Dr. Juleen China notified. Will send patient back to room.

## 2017-06-19 NOTE — Plan of Care (Signed)
Problem: Activity: Goal: Risk for activity intolerance will decrease Outcome: Not Progressing Pt unable to get out of bed until seen by orthopedic surgery to determine weight bearing status

## 2017-06-19 NOTE — NC FL2 (Signed)
[employer] LEVEL OF CARE SCREENING TOOL     IDENTIFICATION  Patient Name: Kelly [name] Birthdate: February 28, 1981 Sex: female Admission Date (Current Location): 06/18/2017  [place] and [place] Number:  Kelly [name]  ([id number] [facility]) Facility and Address:  [facility], [address] [place], [place] [address]      Provider Number: [id number]  Attending Physician Name and Address:  Kelly Bow, MD  Relative Name and Phone Number:       Current Level of Care: Hospital Recommended Level of Care: [name] Prior Approval Number:    Date Approved/Denied:   PASRR Number:  [id number])  Discharge Plan: Domiciliary (Rest home)    Current Diagnoses: Patient Active Problem List   Diagnosis Date Noted  . Multiple fractures of both lower extremities 06/18/2017  . 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 Hyperkalemia 06/25/2016  . Adjustment disorder with mixed disturbance of emotions and conduct   . Diabetes ([place]) 04/23/2015  . Essential hypertension 04/23/2015  . ESRD on dialysis ([place]) 04/23/2015  . Pain of left arm 04/22/2015    Orientation RESPIRATION BLADDER Height & Weight     Self, Time, Situation, Place  Normal Continent Weight: 193 lb 9 oz (87.8 kg) Height:  5' (152.4 cm)  BEHAVIORAL SYMPTOMS/MOOD NEUROLOGICAL BOWEL NUTRITION STATUS      Continent Diet (Diet: Heart Healthy/ Carb Modified. )  AMBULATORY STATUS COMMUNICATION OF NEEDS Skin   Limited Assist Verbally Normal                       Personal Care Assistance Level of Assistance  Bathing, Feeding, Dressing Bathing Assistance: Limited assistance Feeding assistance: Independent Dressing Assistance: Limited assistance     Functional Limitations Info  Sight, Hearing, Speech Sight Info: Adequate Hearing Info: Adequate Speech Info: Adequate    SPECIAL CARE FACTORS FREQUENCY  PT (By licensed PT)  Dialysis patient      PT Frequency:  (2-3 via home health  )              Contractures      Additional Factors Info  Code Status, Allergies Code Status Info:  (Full Code. ) Allergies Info:  (No Known Allergies. )           Current Medications (06/19/2017):  This is the current hospital active medication list Current Facility-Administered Medications  Medication Dose Route Frequency Provider Last Rate Last Dose  . acetaminophen (TYLENOL) tablet 650 mg  650 mg Oral Q6H PRN Kelly [name], Alexis, DO       Or  . acetaminophen (TYLENOL) suppository 650 mg  650 mg Rectal Q6H PRN Kelly [name], Alexis, DO      . albuterol (PROVENTIL) (2.5 MG/3ML) 0.083% nebulizer solution 2.5 mg  2.5 mg Nebulization Q6H PRN Kelly [name], Alexis, DO      . amLODipine (NORVASC) tablet 10 mg  10 mg Oral QPM Kelly [name], Alexis, DO   10 mg at 06/19/17 1739  . aspirin chewable tablet 81 mg  81 mg Oral Daily Kelly [name], Alexis, DO   81 mg at 06/19/17 1142  . bisacodyl (DULCOLAX) EC tablet 5 mg  5 mg Oral Daily PRN Kelly [name], Alexis, DO      . calcium acetate (PHOSLO) capsule 1,334 mg  1,334 mg Oral BID WC Kelly [name], Alexis, DO   1,334 mg at 06/19/17 1739  . cinacalcet (SENSIPAR) tablet 60 mg  60 mg Oral Daily Kelly [name], Alexis, DO   60 mg at 06/19/17 1142  . cloNIDine (CATAPRES) tablet 0.1  mg  0.1 mg Oral BID Kelly [name], Alexis, DO   Stopped at 06/19/17 0945  . divalproex (DEPAKOTE) DR tablet 1,000 mg  1,000 mg Oral QHS Kelly [name], Alexis, DO   1,000 mg at 06/19/17 0104  . docusate sodium (COLACE) capsule 100 mg  100 mg Oral QHS Kelly [name], Alexis, DO   100 mg at 06/19/17 0104  . escitalopram (LEXAPRO) tablet 15 mg  15 mg Oral Daily Kelly [name], Alexis, DO   15 mg at 06/19/17 1142  . folic acid (FOLVITE) tablet 2 mg  2 mg Oral QHS Kelly [name], Alexis, DO   2 mg at 06/19/17 0104  . heparin injection 5,000 Units  5,000 Units Subcutaneous Q8H Kelly [name], Alexis, DO   5,000 Units at 06/19/17 0533  . hydrALAZINE (APRESOLINE) tablet 25 mg  25 mg Oral TID Kelly [name], Alexis, DO    Stopped at 06/19/17 0945  . insulin aspart (novoLOG) injection 0-15 Units  0-15 Units Subcutaneous TID WC Kelly [name], Alexis, DO   5 Units at 06/19/17 1739  . insulin aspart (novoLOG) injection 0-5 Units  0-5 Units Subcutaneous QHS Kelly [name], Alexis, DO      . ipratropium (ATROVENT) nebulizer solution 0.5 mg  0.5 mg Nebulization Q6H PRN Kelly [name], Alexis, DO      . irbesartan (AVAPRO) tablet 300 mg  300 mg Oral QHS Kelly [name], Alexis, DO   300 mg at 06/19/17 0104  . magnesium citrate solution 1 Bottle  1 Bottle Oral Once PRN Kelly [name], Alexis, DO      . metoprolol tartrate (LOPRESSOR) tablet 50 mg  50 mg Oral BID Kelly [name], Alexis, DO   Stopped at 06/19/17 0945  . morphine 2 MG/ML injection 1 mg  1 mg Intravenous Q4H PRN Kelly [name], Alexis, DO   1 mg at 06/19/17 1509  . multivitamin (RENA-VIT) tablet 1 tablet  1 tablet Oral Daily Kelly [name], Alexis, DO   1 tablet at 06/19/17 1142  . multivitamin with minerals tablet 1 tablet  1 tablet Oral Daily Kelly [name], Alexis, DO   1 tablet at 06/19/17 1142  . ondansetron (ZOFRAN) tablet 4 mg  4 mg Oral Q6H PRN Kelly [name], Alexis, DO       Or  . ondansetron (ZOFRAN) injection 4 mg  4 mg Intravenous Q6H PRN Kelly [name], Alexis, DO      . oxyCODONE (Oxy IR/ROXICODONE) immediate release tablet 5 mg  5 mg Oral Q4H PRN Kelly [name], Alexis, DO      . pantoprazole (PROTONIX) EC tablet 40 mg  40 mg Oral Daily Kelly [name], Alexis, DO   40 mg at 06/19/17 1142  . patiromer Daryll Drown) packet 8.4 g  8.4 g Oral Q24H Kelly [name], Alexis, DO      . QUEtiapine (SEROQUEL) tablet 200 mg  200 mg Oral BID Kelly [name], Alexis, DO   200 mg at 06/19/17 1142  . senna-docusate (Senokot-S) tablet 1 tablet  1 tablet Oral QHS PRN Kelly [name], Alexis, DO      . traZODone (DESYREL) tablet 100 mg  100 mg Oral QHS Kelly [name], Alexis, DO   100 mg at 06/19/17 0105  . vitamin B-12 (CYANOCOBALAMIN) tablet 1,000 mcg  1,000 mcg Oral QHS Kelly [name], Alexis, DO   1,000 mcg at 06/19/17 0104      Discharge Medications: Please see discharge summary for a list of discharge medications.  Relevant Imaging Results:  Relevant Lab Results:   Additional Information  (SSN: [id number])  Kelly [name], Veronia Beets, LCSW

## 2017-06-19 NOTE — Consult Note (Signed)
ORTHOPAEDIC CONSULTATION  REQUESTING PHYSICIAN: Hillary Bow, MD  Chief Complaint:   Bilateral foot pain.  History of Present Illness: Kelly [name] is a 36 y.o. female with multiple medical problems including bipolar disorder, alcohol abuse, diabetes, hyperparathyroidism, hypertension, and hemodialysis dependent renal disease who apparently was in her usual state of health yesterday afternoon when she apparently rolled her feet while ambulating, causing her to fall to the ground. She was unable to get up and weight-bear, so she was brought to the emergency room where x-rays and subsequent CT scans confirmed the presence of multiple essentially nondisplaced metatarsal fractures of both feet. The patient was subsequently admitted for pain control, treatment of these injuries, and probable placement. The patient denies any lightheadedness, dizziness, chest pain, shortness of breath, or other symptoms that may have precipitated these falls. She did not strike her head or lose consciousness as a result of the fall.  Past Medical History:  Diagnosis Date  . Alcohol abuse    chronic  . Bipolar disorder ([place])   . Chronic kidney disease   . Cognitive changes   . Cognitive impairment   . Depression   . Depression   . Diabetes mellitus without complication ([place])   . Elevated lipids   . Hyperparathyroidism ([place])   . Hypertension   . Staph aureus infection    A/V fistula  . UTI (lower urinary tract infection)    Past Surgical History:  Procedure Laterality Date  . AV FISTULA PLACEMENT    . CHOLECYSTECTOMY    . dialysis perma cath Right    chest  . PERIPHERAL VASCULAR CATHETERIZATION N/A 05/24/2015   Procedure: Dialysis/Perma Catheter Removal;  Surgeon: Katha Cabal, MD;  Location: [facility];  Service: Cardiovascular;  Laterality: N/A;  . PERIPHERAL VASCULAR CATHETERIZATION Left 02/28/2016   Procedure: A/V  Shuntogram/Fistulagram;  Surgeon: Katha Cabal, MD;  Location: [facility];  Service: Cardiovascular;  Laterality: Left;  . PERIPHERAL VASCULAR CATHETERIZATION N/A 02/28/2016   Procedure: A/V Shunt Intervention;  Surgeon: Katha Cabal, MD;  Location: [facility];  Service: Cardiovascular;  Laterality: N/A;  . PERIPHERAL VASCULAR CATHETERIZATION N/A 06/26/2016   Procedure: A/V Shuntogram/Fistulagram;  Surgeon: Katha Cabal, MD;  Location: [facility];  Service: Cardiovascular;  Laterality: N/A;  . PERIPHERAL VASCULAR CATHETERIZATION N/A 09/10/2016   Procedure: A/V Shuntogram/Fistulagram;  Surgeon: Algernon Huxley, MD;  Location: [facility];  Service: Cardiovascular;  Laterality: N/A;  . PERIPHERAL VASCULAR CATHETERIZATION Left 09/14/2016   Procedure: Thrombectomy;  Surgeon: Katha Cabal, MD;  Location: [facility];  Service: Cardiovascular;  Laterality: Left;  Marland Kitchen VASCULAR ACCESS DEVICE INSERTION Left 04/22/2015   Procedure: INSERTION OF HERO VASCULAR ACCESS DEVICE;  Surgeon: Katha Cabal, MD;  Location: [facility];  Service: Vascular;  Laterality: Left;   Social History   Social History  . Marital status: Single    Spouse name: N/A  . Number of children: N/A  . Years of education: N/A   Social History Main Topics  . Smoking status: Current Every Day Smoker    Packs/day: 0.25    Years: 0.00    Types: Cigarettes  . Smokeless tobacco: Current User  . Alcohol use No  . Drug use: No  . Sexual activity: Not Asked   Other Topics Concern  . None   Social History Narrative  . None   No family history on file. No Known Allergies Prior to Admission medications  Medication Sig Start Date End Date Taking? Authorizing Provider  acetaminophen (TYLENOL) 325 MG tablet Take 2 tablets (650 mg total) by mouth every 6 (six) hours as needed for mild pain, moderate pain or headache. 10/01/16  Yes Clapacs, Madie Reno, MD  amLODipine (NORVASC) 10  MG tablet Take 10 mg by mouth at bedtime. *hold for systolic blood pressure <235, call MD if holding this medication.*   Yes [provider]  amLODipine (NORVASC) 10 MG tablet Take 1 tablet (10 mg total) by mouth every evening. 10/01/16  Yes Clapacs, Madie Reno, MD  aspirin 81 MG chewable tablet Chew 81 mg by mouth daily.   Yes [provider]  aspirin 81 MG chewable tablet Chew 1 tablet (81 mg total) by mouth daily. 10/02/16  Yes Clapacs, Madie Reno, MD  B Complex-C-Folic Acid (RENA-VITE RX) 1 MG TABS Take 1 tablet by mouth daily. 10/01/16  Yes Clapacs, Madie Reno, MD  bacitracin ointment Apply to affected area twice daily 11/27/16 11/27/17 Yes Hagler, Jami L, PA-C  calcium acetate (PHOSLO) 667 MG capsule Take 2 capsules (1,334 mg total) by mouth 2 (two) times daily with a meal. 10/01/16  Yes Clapacs, Madie Reno, MD  cinacalcet (SENSIPAR) 60 MG tablet Take 1 tablet (60 mg total) by mouth daily. 10/01/16  Yes Clapacs, Madie Reno, MD  cloNIDine (CATAPRES) 0.1 MG tablet Take 1 tablet (0.1 mg total) by mouth 2 (two) times daily. 10/01/16  Yes Clapacs, Madie Reno, MD  cloNIDine (CATAPRES) 0.2 MG tablet Take 0.2 mg by mouth 2 (two) times daily.   Yes [provider]  divalproex (DEPAKOTE) 500 MG DR tablet Take 1,000 mg by mouth at bedtime.    Yes [provider]  divalproex (DEPAKOTE) 500 MG DR tablet Take 2 tablets (1,000 mg total) by mouth at bedtime. 10/01/16  Yes Clapacs, Madie Reno, MD  docusate sodium (COLACE) 100 MG capsule Take 1 capsule (100 mg total) by mouth at bedtime. 10/01/16  Yes Clapacs, Madie Reno, MD  escitalopram (LEXAPRO) 10 MG tablet Take 15 mg by mouth daily.   Yes [provider]  escitalopram (LEXAPRO) 10 MG tablet Take 1.5 tablets (15 mg total) by mouth daily. 10/02/16  Yes Clapacs, Madie Reno, MD  folic acid (FOLVITE) 1 MG tablet Take 2 tablets (2 mg total) by mouth at bedtime. 10/01/16  Yes Clapacs, Madie Reno, MD  hydrALAZINE (APRESOLINE) 25 MG tablet Take 1 tablet (25 mg total) by  mouth 3 (three) times daily. 10/01/16  Yes Clapacs, Madie Reno, MD  insulin aspart (NOVOLOG) 100 UNIT/ML injection Inject 0-12 Units into the skin 3 (three) times daily before meals. Per sliding scale   Yes [provider]  insulin aspart (NOVOLOG) 100 UNIT/ML injection Inject 0-15 Units into the skin 3 (three) times daily with meals. 10/01/16  Yes Clapacs, Madie Reno, MD  irbesartan (AVAPRO) 300 MG tablet Take 1 tablet (300 mg total) by mouth at bedtime. 10/01/16  Yes Clapacs, Madie Reno, MD  lidocaine-prilocaine (EMLA) cream Apply 1 application topically 3 (three) times a week. Apply to access site 30 minutes before dialysis. 10/01/16  Yes Clapacs, Madie Reno, MD  medroxyPROGESTERone (DEPO-PROVERA) 150 MG/ML injection Inject 150 mg into the muscle every 3 (three) months.   Yes [provider]  metoprolol (LOPRESSOR) 50 MG tablet Take 50 mg by mouth 2 (two) times daily.   Yes [provider]  metoprolol (LOPRESSOR) 50 MG tablet Take 1 tablet (50 mg total) by mouth 2 (two) times daily. 10/01/16  Yes Clapacs, Madie Reno, MD  Multiple Vitamin (THEREMS) TABS Take 1 tablet by mouth daily. 10/01/16  Yes Clapacs, Madie Reno, MD  pantoprazole (PROTONIX) 20 MG tablet Take 1 tablet (20 mg total) by mouth daily. 04/20/17  Yes Shary Decamp, PA-C  patiromer (VELTASSA) 8.4 g packet Take 1 packet (8.4 g total) by mouth daily. 10/01/16  Yes Clapacs, Madie Reno, MD  QUEtiapine (SEROQUEL) 200 MG tablet Take 200 mg by mouth 2 (two) times daily.    Yes [provider]  QUEtiapine (SEROQUEL) 200 MG tablet Take 1 tablet (200 mg total) by mouth 2 (two) times daily. 10/01/16  Yes Clapacs, Madie Reno, MD  sulfamethoxazole-trimethoprim (BACTRIM DS,SEPTRA DS) 800-160 MG tablet Take 1 tablet by mouth 2 (two) times daily. 11/27/16  Yes Hagler, Jami L, PA-C  traZODone (DESYREL) 100 MG tablet Take 100 mg by mouth at bedtime.   Yes [provider]  traZODone (DESYREL) 100 MG tablet Take 1 tablet (100 mg total) by mouth at bedtime.  10/01/16  Yes Clapacs, Madie Reno, MD  vitamin B-12 (CYANOCOBALAMIN) 1000 MCG tablet Take 1 tablet (1,000 mcg total) by mouth at bedtime. 10/01/16  Yes Clapacs, Madie Reno, MD   Ct Foot Left Wo Contrast  Result Date: 06/18/2017 CLINICAL DATA:  Fall on bilateral feet. Multiple fractures. Nonweightbearing. EXAM: CT OF THE LEFT FOOT WITHOUT CONTRAST TECHNIQUE: Multidetector CT imaging of the left foot was performed according to the standard protocol. Multiplanar CT image reconstructions were also generated. COMPARISON:  Radiograph earlier this day. FINDINGS: Bones/Joint/Cartilage Fractures at the base of the second, third, and fourth metatarsals extended the tarsal metatarsal articulations. No significant displacement. There is no widening of the Lisfranc articulation. Small osseous density adjacent to the dorsal medial cuneiform has sclerotic margins and is likely chronic. The tarsal bones are otherwise intact. The digits appear intact. No tibiotalar joint effusion. Ligaments Suboptimally assessed by CT. Some Lisfranc ligamentous fibers are visualized. Muscles and Tendons No acute abnormality. Soft tissues Soft tissue edema about the foot.  No confluent hematoma. IMPRESSION:  (LEFT FOOT): IMPRESSION:  (LEFT FOOT) 1. Fractures at the base of the second, third, and fourth metatarsals with intra-articular extension. No significant displacement. Lisfranc ligament is grossly intact. 2. Osseous density adjacent of the dorsal medial cuneiform has sclerotic margins and is likely chronic. Electronically Signed   By: Jeb Levering M.D.   On: 06/18/2017 22:05   Ct Foot Right Wo Contrast  Result Date: 06/18/2017 CLINICAL DATA:  Fall on bilateral feet. Multiple fractures. Nonweightbearing. EXAM: CT OF THE RIGHT FOOT WITHOUT CONTRAST TECHNIQUE: Multidetector CT imaging of the right foot was performed according to the standard protocol. Multiplanar CT image reconstructions were also generated. COMPARISON:  Radiograph earlier this  day. FINDINGS: Bones/Joint/Cartilage Fractures at the base of the first through fourth proximal metatarsals. First metatarsal fracture is tiny chip fracture about the medial cortex extending to the tarsal metatarsal joint. Second, third, and fourth proximal metatarsal fractures are mildly comminuted, nondisplaced and extending to the tarsal metatarsal joints. No widening of the Lisfranc articulation. Mildly displaced distal fibular fracture. Tarsal bones and digits are intact. Tibial talar joint is maintained. Small tibiotalar joint effusion. Ligaments Suboptimally assessed by CT. One Lisfranc ligamentous fibers are visualized. Muscles and Tendons No acute abnormality. Soft tissues Diffuse soft tissue edema of the foot. Dense vascular calcifications. IMPRESSION: 1. Fractures at the base of the first through fourth metatarsals. The second, third, and fourth metatarsal fractures are mildly comminuted, nondisplaced and intra-articular. First metatarsal fracture is  a tiny chip fracture at the articular surface. Lisfranc ligament is grossly intact. 2. Mildly displaced distal fibular fracture. Electronically Signed   By: Jeb Levering M.D.   On: 06/18/2017 22:12   Dg Foot Complete Left  Result Date: 06/18/2017 CLINICAL DATA:  Fall with bilateral foot pain EXAM: LEFT FOOT - COMPLETE 3+ VIEW COMPARISON:  None. FINDINGS: Acute nondisplaced fractures involving the proximal shafts of the third and fourth metatarsals. No significant angulation. Vascular calcifications. IMPRESSION: Acute nondisplaced fractures involving the proximal third and fourth metatarsal Electronically Signed   By: Donavan Foil M.D.   On: 06/18/2017 20:18   Dg Foot Complete Right  Result Date: 06/18/2017 CLINICAL DATA:  Bilateral foot pain EXAM: RIGHT FOOT COMPLETE - 3+ VIEW COMPARISON:  None. FINDINGS: No dislocation is evident. Small intra-articular fracture involving the base of the first metatarsal. Nondisplaced fractures involving the base  of the second metatarsal. Linear lucency involving the proximal shaft of the fourth and possibly fifth metatarsals. IMPRESSION: 1. Small intra-articular fracture involving the base of the first metatarsal 2. Additional nondisplaced fractures involving the base of second metatarsal and proximal shaft of fourth metatarsal. Possible subtle nondisplaced fracture involving proximal shaft of fifth metatarsal. Electronically Signed   By: Donavan Foil M.D.   On: 06/18/2017 20:16    Positive ROS: All other systems have been reviewed and were otherwise negative with the exception of those mentioned in the HPI and as above.  Physical Exam: General:  Alert, no acute distress Psychiatric:  Patient is competent for consent with normal mood and affect   Cardiovascular:  No pedal edema Respiratory:  No wheezing, non-labored breathing GI:  Abdomen is soft and non-tender Skin:  No lesions in the area of chief complaint Neurologic:  Sensation intact distally Lymphatic:  No axillary or cervical lymphadenopathy  Orthopedic Exam:  Orthopedic examination is limited to both feet and lower legs. Examination of the right foot and ankle demonstrates moderate swelling around the lateral aspect of the ankle, as well as over the dorsum of the midfoot region. No ecchymosis, abrasions, lacerations, or other skin abnormalities are identified. The patient is able to lightly flicker her toes and ankle, but complains of pain with any attempted active or passive motion of either the ankle or the foot. She has moderate tenderness to palpation over the lateral ankle region, as well as over the dorsal midfoot region. Sensation is intact to light touch to all distributions. She has good capillary refill to all digits.  Examination of the left foot and ankle demonstrate moderate swelling over the dorsal midfoot region. She has moderate tenderness to palpation over this area, but has no tenderness around ankle. She is able to actively  dorsiflex and plantarflex her toes and ankle, although motion is limited secondary to apprehension and pain. She has intact sensation to light touch to all distributions and she has good capillary refill to all digits.  X-rays:  X-rays and CT scans of both feet are available for review. The findings are as described above. The right foot and ankle CT scan demonstrate multiple essentially nondisplaced fractures involving the bases of the first, second, third, and fourth metatarsals, as well as a minimally displaced fracture involving the anterior portion of the distal fibula. Lisfranc's joint and Lisfranc's ligament appear to be intact.  The left foot and ankle CT scan also demonstrates multiple essentially nondisplaced fractures involving the bases of the second, third, and fourth metatarsals. Lisfranc's joint and Lisfranc's ligament appear to be intact.  Assessment: Multiple metatarsal fractures of both feet with a minimally displaced stable right distal fibular fracture.  Plan: The treatment options were discussed with the patient. None of these fractures require surgical intervention. The right foot is placed into a long cam walker boot to help protect both the metatarsal fractures and the ankle fracture, while the left foot is placed into a short cam walker boot to help protect her metatarsal fractures on this side. The patient may weight-bear as tolerated in the boots, using a walker for balance and support. Most likely, this will be quite difficult for the first 2-3 weeks, even with the boots on, and most likely necessitating rehabilitation placement for this patient. These boots may be removed for bathing and hygienic purposes.  Thank you for ask me to put despite in the care of this most pleasant unfortunate woman. We will be happy to see her back in our office in 3-4 weeks for repeat x-rays of both feet and the right ankle.   Pascal Lux, MD  Beeper #:  [phone]  06/19/2017 3:53 PM

## 2017-06-19 NOTE — Progress Notes (Signed)
Pre HD  

## 2017-06-19 NOTE — Progress Notes (Signed)
Pt refusing to be swabbed for MRSA and refusing to have labs drawn at this time. Dr. Ara Kussmaul notified.

## 2017-06-19 NOTE — Progress Notes (Signed)
PT Cancellation Note  Patient Details Name: Kelly [name] MRN: [id number] DOB: 1981/07/22   Cancelled Treatment:    Reason Eval/Treat Not Completed: Other (comment) Consult received and chart reviewed. Pt currently with pending orthopedic surgery consult. Will re-attempt, when ortho clears pt for treat; will need appropriate WB precautions. Will hold therapy consult at this time.      Bevelyn Ngo 06/19/2017, 8:33 AM

## 2017-06-19 NOTE — Progress Notes (Signed)
Pre dialysis assessment. Patient cooperative. In no distress

## 2017-06-19 NOTE — Progress Notes (Signed)
Pt repeatedly moving access arm. Asked her to please keep access arm still during HD numerous times. Pt also saying she does not want her HD tx after 10 minutes of HD. Kolluru MD notified. Resting quietly now.

## 2017-06-19 NOTE — Clinical Social Work Note (Signed)
Clinical Social Work Assessment  Patient Details  Name: Kelly [name] MRN: [id number] Date of Birth: November 19, 1981  Date of referral:  06/19/17               Reason for consult:  Other (Comment Required) (Patient is from [facility] in [place]. )                Permission sought to share information with:  Facility Art therapist granted to share information::  Yes, Verbal Permission Granted (Guardian gave permission to speak to family care home owner. )  Name::        Agency::     Relationship::     Contact Information:     Housing/Transportation Living arrangements for the past 2 months:  Group Home Source of Information:  [facility] Patient Interpreter Needed:  None Criminal Activity/Legal Involvement Pertinent to Current Situation/Hospitalization:  No - Comment as needed Significant Relationships:  Mental Health Provider Lives with:  Facility Resident Do you feel safe going back to the place where you live?    Need for family participation in patient care:   (Patient has a guardian )  Care giving concerns:  Patient is a resident at [facility] located at [place], [place] [address].    Social Worker assessment / plan:  Patient came to [facility] for bilateral foot pain from her family care home in [place]. Clinical Social Worker (CSW) is familiar with patient from previous admissions. Porfirio Mylar [phone] is patient's guardian from [employer]. CSW contacted Derl Barrow who reported that patient has been at the family care home in [place] since she left [facility] in Nov. 2017. Per guardian patient is now with [facility] and the group home sends sitters to stay with patient during her dialysis sessions due to her behaviors. Per guardian the family care home owner Beaulah Dinning stated that he would take patient back from [id number] if she can ambulate. CSW discussed with guardian that SNF placement  will not be likely because patient requires a sitter for dialysis. Patient goes to dialysis MWF at [facility] on [address] in [place]. Guardian agrees that SNF placement will be unlikely. CSW also contacted family care home owner Romulus Watlington. Per group home owner patient has been with them since Nov. 2017 and group home provides 24/7 care, sitters for dialysis, transportation to dialysis and they monitor patient's food and liquid intake. Per group home owner [place] has not paid the group home since May 2018. Per group home owner and guardian they have submitted the necessary paper work to the [facility] for re-authroization. Group home owner stated that they will accept patient back from [facility] if she can ambulate and the LME will pay the group home. Per group home owner they would not have the staff or funds to provide sitters for dialysis if patient went to SNF. CSW discussed case with PT and made them aware of above. CSW will continue to follow and assist as needed.        Employment status:  Disabled (Comment on whether or not currently receiving Disability) Insurance information:  Medicaid [place] PT Recommendations:  Not assessed at this time Information / Referral to community resources:  Other (Comment Required) (Patient will likely return to the group home. )  Patient/Family's Response to care:  Per guardian SNF placement will be unlikely because patient requires sitters for dialysis.   Patient/Family's Understanding of and Emotional Response to  Diagnosis, Current Treatment, and Prognosis:  Guardian was very pleasant and thanked CSW for calling.   Emotional Assessment Appearance:  Appears stated age Attitude/Demeanor/Rapport:    Affect (typically observed):  Pleasant Orientation:  Oriented to Self, Oriented to Place, Oriented to  Time, Oriented to Situation, Fluctuating Orientation (Suspected and/or reported Sundowners) Alcohol / Substance use:  Not Applicable Psych involvement  (Current and /or in the community):  No (Comment)  Discharge Needs  Concerns to be addressed:  Discharge Planning Concerns Readmission within the last 30 days:  No Current discharge risk:  Chronically ill, Cognitively Impaired, Dependent with Mobility, Physical Impairment, Lack of support system, Psychiatric Illness Barriers to Discharge:  Continued Medical Work up   [employer], Veronia Beets, LCSW 06/19/2017, 6:45 PM

## 2017-06-19 NOTE — Progress Notes (Signed)
[facility] [place] NAME: Kelly [name]    MR#:  [id number]  DATE OF BIRTH:  Jun 27, 1981  SUBJECTIVE:  CHIEF COMPLAINT:   Chief Complaint  Patient presents with  . Foot Pain   B/L feet pain persists.  REVIEW OF SYSTEMS:    Review of Systems  Constitutional: Negative for chills and fever.  HENT: Negative for sore throat.   Eyes: Negative for blurred vision, double vision and pain.  Respiratory: Negative for cough, hemoptysis, shortness of breath and wheezing.   Cardiovascular: Negative for chest pain, palpitations, orthopnea and leg swelling.  Gastrointestinal: Negative for abdominal pain, constipation, diarrhea, heartburn, nausea and vomiting.  Genitourinary: Negative for dysuria and hematuria.  Musculoskeletal: Positive for joint pain. Negative for back pain.  Skin: Negative for rash.  Neurological: Negative for sensory change, speech change, focal weakness and headaches.  Endo/Heme/Allergies: Does not bruise/bleed easily.  Psychiatric/Behavioral: Negative for depression. The patient is not nervous/anxious.     DRUG ALLERGIES:  No Known Allergies  VITALS:  Blood pressure (!) 143/80, pulse (!) 112, temperature 98.6 F (37 C), temperature source Oral, resp. rate 18, height 5' (1.524 m), weight 87.8 kg (193 lb 9 oz), SpO2 94 %.  PHYSICAL EXAMINATION:   Physical Exam  GENERAL:  36 y.o.-year-old patient lying in the bed with no acute distress.  EYES: Pupils equal, round, reactive to light and accommodation. No scleral icterus. Extraocular muscles intact.  HEENT: Head atraumatic, normocephalic. Oropharynx and nasopharynx clear.  NECK:  Supple, no jugular venous distention. No thyroid enlargement, no tenderness.  LUNGS: Normal breath sounds bilaterally, no wheezing, rales, rhonchi. No use of accessory muscles of respiration.  CARDIOVASCULAR: S1, S2 normal. No murmurs, rubs, or gallops.  ABDOMEN: Soft, nontender, nondistended. Bowel  sounds present. No organomegaly or mass.  EXTREMITIES: No cyanosis, clubbing or edema b/l.   Tenderness b/l feet NEUROLOGIC: Cranial nerves II through XII are intact. No focal Motor or sensory deficits b/l.   PSYCHIATRIC: The patient is alert and awake. Flat affect SKIN: No obvious rash, lesion, or ulcer.   LABORATORY PANEL:   CBC  Recent Labs Lab 06/19/17 1030  WBC 6.6  HGB 10.8*  HCT 31.9*  PLT 192   ------------------------------------------------------------------------------------------------------------------ Chemistries   Recent Labs Lab 06/19/17 0729 06/19/17 1030  NA  --  137  K  --  4.5  CL  --  95*  CO2  --  27  GLUCOSE  --  144*  BUN  --  58*  CREATININE  --  12.07*  CALCIUM  --  9.3  MG 2.3  --    ------------------------------------------------------------------------------------------------------------------  Cardiac Enzymes No results for input(s): TROPONINI in the last 168 hours. ------------------------------------------------------------------------------------------------------------------  RADIOLOGY:  Ct Foot Left Wo Contrast  Result Date: 06/18/2017 CLINICAL DATA:  Fall on bilateral feet. Multiple fractures. Nonweightbearing. EXAM: CT OF THE LEFT FOOT WITHOUT CONTRAST TECHNIQUE: Multidetector CT imaging of the left foot was performed according to the standard protocol. Multiplanar CT image reconstructions were also generated. COMPARISON:  Radiograph earlier this day. FINDINGS: Bones/Joint/Cartilage Fractures at the base of the second, third, and fourth metatarsals extended the tarsal metatarsal articulations. No significant displacement. There is no widening of the Lisfranc articulation. Small osseous density adjacent to the dorsal medial cuneiform has sclerotic margins and is likely chronic. The tarsal bones are otherwise intact. The digits appear intact. No tibiotalar joint effusion. Ligaments Suboptimally assessed by CT. Some Lisfranc ligamentous  fibers are visualized. Muscles and Tendons  No acute abnormality. Soft tissues Soft tissue edema about the foot.  No confluent hematoma. IMPRESSION:  (LEFT FOOT): IMPRESSION:  (LEFT FOOT) 1. Fractures at the base of the second, third, and fourth metatarsals with intra-articular extension. No significant displacement. Lisfranc ligament is grossly intact. 2. Osseous density adjacent of the dorsal medial cuneiform has sclerotic margins and is likely chronic. Electronically Signed   By: Jeb Levering M.D.   On: 06/18/2017 22:05   Ct Foot Right Wo Contrast  Result Date: 06/18/2017 CLINICAL DATA:  Fall on bilateral feet. Multiple fractures. Nonweightbearing. EXAM: CT OF THE RIGHT FOOT WITHOUT CONTRAST TECHNIQUE: Multidetector CT imaging of the right foot was performed according to the standard protocol. Multiplanar CT image reconstructions were also generated. COMPARISON:  Radiograph earlier this day. FINDINGS: Bones/Joint/Cartilage Fractures at the base of the first through fourth proximal metatarsals. First metatarsal fracture is tiny chip fracture about the medial cortex extending to the tarsal metatarsal joint. Second, third, and fourth proximal metatarsal fractures are mildly comminuted, nondisplaced and extending to the tarsal metatarsal joints. No widening of the Lisfranc articulation. Mildly displaced distal fibular fracture. Tarsal bones and digits are intact. Tibial talar joint is maintained. Small tibiotalar joint effusion. Ligaments Suboptimally assessed by CT. One Lisfranc ligamentous fibers are visualized. Muscles and Tendons No acute abnormality. Soft tissues Diffuse soft tissue edema of the foot. Dense vascular calcifications. IMPRESSION: 1. Fractures at the base of the first through fourth metatarsals. The second, third, and fourth metatarsal fractures are mildly comminuted, nondisplaced and intra-articular. First metatarsal fracture is a tiny chip fracture at the articular surface. Lisfranc  ligament is grossly intact. 2. Mildly displaced distal fibular fracture. Electronically Signed   By: Jeb Levering M.D.   On: 06/18/2017 22:12   Dg Foot Complete Left  Result Date: 06/18/2017 CLINICAL DATA:  Fall with bilateral foot pain EXAM: LEFT FOOT - COMPLETE 3+ VIEW COMPARISON:  None. FINDINGS: Acute nondisplaced fractures involving the proximal shafts of the third and fourth metatarsals. No significant angulation. Vascular calcifications. IMPRESSION: Acute nondisplaced fractures involving the proximal third and fourth metatarsal Electronically Signed   By: Donavan Foil M.D.   On: 06/18/2017 20:18   Dg Foot Complete Right  Result Date: 06/18/2017 CLINICAL DATA:  Bilateral foot pain EXAM: RIGHT FOOT COMPLETE - 3+ VIEW COMPARISON:  None. FINDINGS: No dislocation is evident. Small intra-articular fracture involving the base of the first metatarsal. Nondisplaced fractures involving the base of the second metatarsal. Linear lucency involving the proximal shaft of the fourth and possibly fifth metatarsals. IMPRESSION: 1. Small intra-articular fracture involving the base of the first metatarsal 2. Additional nondisplaced fractures involving the base of second metatarsal and proximal shaft of fourth metatarsal. Possible subtle nondisplaced fracture involving proximal shaft of fifth metatarsal. Electronically Signed   By: Donavan Foil M.D.   On: 06/18/2017 20:16     ASSESSMENT AND PLAN:   This is a 36 y.o. female with a history of Bipolar disorder with significant cognitive impairment, combative and aggressive behavior, CKD, depression, diabetes, hypertension, hyperlipidemia  now being admitted with:  #. Intractable pain 2/2 multiple nondisplaced fractures of bilateral feet - PT eval - SW for consideration of SAR placement - Ortho consult appreciated. Boots ordered. Can bear weight. No surgery. Pain meds PRN.  #. History of end-stage renal disease -Continue PhosLo, Sensipar - Nephro  consult for HD - Patient refused HD  #. History of BPD -Continue Depakote, Lexapro, Seroquel, trazodone  #. History of GERD - Continue Protonix  #.  History of hypertension - Continue Norvasc, clonidine, hydralazine, Avapro, metoprolol  #. History of  Diabetes - Accuchecks achs with RISS coverage - Heart healthy, carb controlled diet.  All the records are reviewed and case discussed with Care Management/Social Worker Management plans discussed with the patient, family and they are in agreement.  CODE STATUS: FULL CODE  DVT Prophylaxis: SCDs  TOTAL TIME TAKING CARE OF THIS PATIENT: 35 minutes.   POSSIBLE D/C IN 1-2 DAYS, DEPENDING ON CLINICAL CONDITION.  Hillary Bow R M.D on 06/19/2017 at 9:36 PM  Between 7am to 6pm - Pager - [phone]  After 6pm go to [email or web address] - password [email or web address] [facility]  Office  [phone]  CC: Primary care physician; Remi Haggard, FNP  Note: This dictation was prepared with Dragon dictation along with smaller phrase technology. Any transcriptional errors that result from this process are unintentional.

## 2017-06-19 NOTE — Progress Notes (Signed)
Post hd assessment 

## 2017-06-20 LAB — GLUCOSE, CAPILLARY
GLUCOSE-CAPILLARY: 243 mg/dL — AB (ref 65–99)
GLUCOSE-CAPILLARY: 147 mg/dL — AB (ref 65–99)
GLUCOSE-CAPILLARY: 169 mg/dL — AB (ref 65–99)
Glucose-Capillary: 75 mg/dL (ref 65–99)

## 2017-06-20 LAB — HIV ANTIBODY (ROUTINE TESTING W REFLEX): HIV SCREEN 4TH GENERATION: NONREACTIVE

## 2017-06-20 LAB — MRSA PCR SCREENING: MRSA by PCR: NEGATIVE

## 2017-06-20 LAB — HEPATITIS B SURFACE ANTIGEN: Hepatitis B Surface Ag: NEGATIVE

## 2017-06-20 MED ORDER — INSULIN ASPART 100 UNIT/ML ~~LOC~~ SOLN
3.0000 [IU] | Freq: Three times a day (TID) | SUBCUTANEOUS | Status: DC
  Administered 2017-06-20 – 2017-06-23 (×8): 3 [IU] via SUBCUTANEOUS
  Filled 2017-06-20 (×8): qty 1

## 2017-06-20 NOTE — Progress Notes (Signed)
[facility]  ROUNDING NOTE   Subjective:   Hemodialysis yesterday. Only stayed on treatment for 45 minutes.   Objective:  Vital signs in last 24 hours:  Temp:  [98.1 F (36.7 C)-98.6 F (37 C)] 98.4 F (36.9 C) (07/26 0247) Pulse Rate:  [79-112] 102 (07/26 0827) Resp:  [18-20] 18 (07/26 0827) BP: (133-153)/(59-91) 134/91 (07/26 0827) SpO2:  [94 %-100 %] 99 % (07/26 0827) Weight:  [85.3 kg (188 lb)-87.8 kg (193 lb 9 oz)] 85.3 kg (188 lb) (07/26 8144)  Weight change: 2.977 kg (6 lb 9 oz) Filed Weights   06/19/17 1022 06/19/17 1349 06/20/17 0623  Weight: 87.8 kg (193 lb 9 oz) 87.8 kg (193 lb 9 oz) 85.3 kg (188 lb)    Intake/Output: No intake/output data recorded.   Intake/Output this shift:  Total I/O In: 240 [P.O.:240] Out: -   Physical Exam: General: NAD,   Head: Normocephalic, atraumatic. Moist oral mucosal membranes  Eyes: Anicteric, PERRL  Neck: Supple, trachea midline  Lungs:  Clear to auscultation  Heart: Regular rate and rhythm  Abdomen:  Soft, nontender,   Extremities: trace peripheral edema.  Neurologic: Nonfocal, moving all four extremities  Skin: No lesions  Access: Left AVF    Basic Metabolic Panel:  Recent Labs Lab 06/19/17 0729 06/19/17 1030  NA  --  137  K  --  4.5  CL  --  95*  CO2  --  27  GLUCOSE  --  144*  BUN  --  58*  CREATININE  --  12.07*  CALCIUM  --  9.3  MG 2.3  --   PHOS 4.3 4.0    Liver Function Tests:  Recent Labs Lab 06/19/17 1030  ALBUMIN 3.5   No results for input(s): LIPASE, AMYLASE in the last 168 hours. No results for input(s): AMMONIA in the last 168 hours.  CBC:  Recent Labs Lab 06/19/17 1030  WBC 6.6  HGB 10.8*  HCT 31.9*  MCV 101.1*  PLT 192    Cardiac Enzymes: No results for input(s): CKTOTAL, CKMB, CKMBINDEX, TROPONINI in the last 168 hours.  BNP: Invalid input(s): POCBNP  CBG:  Recent Labs Lab 06/19/17 1150 06/19/17 1638 06/19/17 2146 06/20/17 0744 06/20/17 1212   GLUCAP 114* 207* 186* 243* 147*    Microbiology: Results for orders placed or performed during the hospital encounter of 06/18/17  MRSA PCR Screening     Status: None   Collection Time: 06/19/17 10:16 PM  Result Value Ref Range Status   MRSA by PCR NEGATIVE NEGATIVE Final    Comment:        The GeneXpert MRSA Assay (FDA approved for NASAL specimens only), is one component of a comprehensive MRSA colonization surveillance program. It is not intended to diagnose MRSA infection nor to guide or monitor treatment for MRSA infections.     Coagulation Studies: No results for input(s): LABPROT, INR in the last 72 hours.  Urinalysis: No results for input(s): COLORURINE, LABSPEC, PHURINE, GLUCOSEU, HGBUR, BILIRUBINUR, KETONESUR, PROTEINUR, UROBILINOGEN, NITRITE, LEUKOCYTESUR in the last 72 hours.  Invalid input(s): APPERANCEUR    Imaging: Ct Foot Left Wo Contrast  Result Date: 06/18/2017 CLINICAL DATA:  Fall on bilateral feet. Multiple fractures. Nonweightbearing. EXAM: CT OF THE LEFT FOOT WITHOUT CONTRAST TECHNIQUE: Multidetector CT imaging of the left foot was performed according to the standard protocol. Multiplanar CT image reconstructions were also generated. COMPARISON:  Radiograph earlier this day. FINDINGS: Bones/Joint/Cartilage Fractures at the base of the second, third, and fourth metatarsals  extended the tarsal metatarsal articulations. No significant displacement. There is no widening of the Lisfranc articulation. Small osseous density adjacent to the dorsal medial cuneiform has sclerotic margins and is likely chronic. The tarsal bones are otherwise intact. The digits appear intact. No tibiotalar joint effusion. Ligaments Suboptimally assessed by CT. Some Lisfranc ligamentous fibers are visualized. Muscles and Tendons No acute abnormality. Soft tissues Soft tissue edema about the foot.  No confluent hematoma. IMPRESSION:  (LEFT FOOT): IMPRESSION:  (LEFT FOOT) 1. Fractures at the  base of the second, third, and fourth metatarsals with intra-articular extension. No significant displacement. Lisfranc ligament is grossly intact. 2. Osseous density adjacent of the dorsal medial cuneiform has sclerotic margins and is likely chronic. Electronically Signed   By: Jeb Levering M.D.   On: 06/18/2017 22:05   Ct Foot Right Wo Contrast  Result Date: 06/18/2017 CLINICAL DATA:  Fall on bilateral feet. Multiple fractures. Nonweightbearing. EXAM: CT OF THE RIGHT FOOT WITHOUT CONTRAST TECHNIQUE: Multidetector CT imaging of the right foot was performed according to the standard protocol. Multiplanar CT image reconstructions were also generated. COMPARISON:  Radiograph earlier this day. FINDINGS: Bones/Joint/Cartilage Fractures at the base of the first through fourth proximal metatarsals. First metatarsal fracture is tiny chip fracture about the medial cortex extending to the tarsal metatarsal joint. Second, third, and fourth proximal metatarsal fractures are mildly comminuted, nondisplaced and extending to the tarsal metatarsal joints. No widening of the Lisfranc articulation. Mildly displaced distal fibular fracture. Tarsal bones and digits are intact. Tibial talar joint is maintained. Small tibiotalar joint effusion. Ligaments Suboptimally assessed by CT. One Lisfranc ligamentous fibers are visualized. Muscles and Tendons No acute abnormality. Soft tissues Diffuse soft tissue edema of the foot. Dense vascular calcifications. IMPRESSION: 1. Fractures at the base of the first through fourth metatarsals. The second, third, and fourth metatarsal fractures are mildly comminuted, nondisplaced and intra-articular. First metatarsal fracture is a tiny chip fracture at the articular surface. Lisfranc ligament is grossly intact. 2. Mildly displaced distal fibular fracture. Electronically Signed   By: Jeb Levering M.D.   On: 06/18/2017 22:12   Dg Foot Complete Left  Result Date: 06/18/2017 CLINICAL DATA:   Fall with bilateral foot pain EXAM: LEFT FOOT - COMPLETE 3+ VIEW COMPARISON:  None. FINDINGS: Acute nondisplaced fractures involving the proximal shafts of the third and fourth metatarsals. No significant angulation. Vascular calcifications. IMPRESSION: Acute nondisplaced fractures involving the proximal third and fourth metatarsal Electronically Signed   By: Donavan Foil M.D.   On: 06/18/2017 20:18   Dg Foot Complete Right  Result Date: 06/18/2017 CLINICAL DATA:  Bilateral foot pain EXAM: RIGHT FOOT COMPLETE - 3+ VIEW COMPARISON:  None. FINDINGS: No dislocation is evident. Small intra-articular fracture involving the base of the first metatarsal. Nondisplaced fractures involving the base of the second metatarsal. Linear lucency involving the proximal shaft of the fourth and possibly fifth metatarsals. IMPRESSION: 1. Small intra-articular fracture involving the base of the first metatarsal 2. Additional nondisplaced fractures involving the base of second metatarsal and proximal shaft of fourth metatarsal. Possible subtle nondisplaced fracture involving proximal shaft of fifth metatarsal. Electronically Signed   By: Donavan Foil M.D.   On: 06/18/2017 20:16     Medications:    . amLODipine  10 mg Oral QPM  . aspirin  81 mg Oral Daily  . calcium acetate  1,334 mg Oral BID WC  . cinacalcet  60 mg Oral Daily  . cloNIDine  0.1 mg Oral BID  . divalproex  1,000 mg Oral QHS  . docusate sodium  100 mg Oral QHS  . escitalopram  15 mg Oral Daily  . folic acid  2 mg Oral QHS  . heparin  5,000 Units Subcutaneous Q8H  . hydrALAZINE  25 mg Oral TID  . insulin aspart  0-15 Units Subcutaneous TID WC  . insulin aspart  0-5 Units Subcutaneous QHS  . irbesartan  300 mg Oral QHS  . metoprolol tartrate  50 mg Oral BID  . multivitamin  1 tablet Oral Daily  . multivitamin with minerals  1 tablet Oral Daily  . pantoprazole  40 mg Oral Daily  . patiromer  8.4 g Oral Q24H  . QUEtiapine  200 mg Oral BID  .  traZODone  100 mg Oral QHS  . vitamin B-12  1,000 mcg Oral QHS   acetaminophen **OR** acetaminophen, albuterol, bisacodyl, ipratropium, magnesium citrate, morphine injection, ondansetron **OR** ondansetron (ZOFRAN) IV, oxyCODONE, senna-docusate  Assessment/ Plan:  [name] is a 36 y.o. black female  with developmental delay, ESRD on hemodialysis, anemia, hypertension, schizophrenia, diabetes mellitus type II  [facility] MWF [address]  1. End Stage Renal Disease: on hemodialysis MWF schedule. Only 45 minutes of treatment yesterday - Next treatment for tomorrow.   2. Hypertension: elevated diastolic - Continue Amlodipine, clonidine, hydralazine, irbesartan, metoprolol  3. Anemia of chronic kidney disease: hemoglobin 10.8 - Continue Epogen with dialysis.  4. Secondary Hyperparathyroidism: Patient has not required [place] therapy   LOS: 2 Kelly [name] 7/26/201812:48 PM

## 2017-06-20 NOTE — Evaluation (Signed)
Physical Therapy Evaluation Patient Details Name: Kelly [name] MRN: [id number] DOB: 1981/09/04 Today's Date: 06/20/2017   History of Present Illness  Pt is a 36 yo F, admitted to acute care on 7/24, with multiple fx in B ft and distal fibula of R ft. Prior to admission, pt required 24 hr assist, and it is unclear of AD with amb, as pt is poor historian. PMH: bipolar disorder with significant cognitive impairment, combative, and aggressive behavior, CKD, depression, diabetes, HTN, HLD.   Clinical Impression  Pt performs bed mobility Mod-MaxA +2, due to significant cognitive impairments, impaired strength, impaired sequencing, and pain. Pt unable to perform tranfers and ambulation due significant impairments in strength to B LE's and UE's. Majority of session required significant and creative persuasion to get pt to participate. Pt responds best to child-like comparisons and imaginary play, via Wizard of Oz, Luther Parody, and disney princesses (favorite is Research officer, political party and the [place]). Also responds well to candy, sweets, stickers, and coloring. Pt would benefit from skilled PT to address the previously mentioned impairments and promote return to PLOF. Currently recommending SNF, pending d/c.      Follow Up Recommendations SNF    Equipment Recommendations  Rolling walker with 5" wheels    Recommendations for Other Services       Precautions / Restrictions Precautions Precautions: Fall Required Braces or Orthoses: Other Brace/Splint Other Brace/Splint: long cam walker boot on R and short cam walker boot on L. Restrictions Weight Bearing Restrictions: Yes RLE Weight Bearing: Weight bearing as tolerated LLE Weight Bearing: Weight bearing as tolerated      Mobility  Bed Mobility Overal bed mobility: Needs Assistance Bed Mobility: Supine to Sit;Sit to Supine     Supine to sit: Mod assist Sit to supine: Max assist;+2 for physical assistance   General bed mobility comments: ModA for  maneuvering B LE's and bringing torso to erect posture. Pt requires creative motivation and persuation to come to sit. Max verbal and visual cues provided. Pt does not like to be assisted or having physical contact to B LE's; though, pt would allow with warning and counting to 3 50% of the time. Pt with refusal to return to supine, requiring ~30 minutes of creative persuasion and Max +3 physical assist.   Transfers                 General transfer comment: Pt unable to perform at this time, due to significant LE and UE weakness.   Ambulation/Gait             General Gait Details: Pt unable to perform at this time, due to significant UE and LE weakness.   Stairs            Wheelchair Mobility    Modified Rankin (Stroke Patients Only)       Balance Overall balance assessment: Needs assistance Sitting-balance support: Bilateral upper extremity supported;Feet supported Sitting balance-Leahy Scale: Good Sitting balance - Comments: Pt with good sitting balance at EOB, requiring no cues for mechancis. However, pt is highly impulsive, requiring supervision-CGA. Pt with refusal for physical contact, but will occassionally allow SPT to hold her hands. Max cues for safety awareness.        Standing balance comment: Unable to assess at this time, as pt has significantly impaired strength through B LE's and UE's  Pertinent Vitals/Pain Pain Assessment: Faces Faces Pain Scale: Hurts even more (Intermittent with movement. ) Pain Location: B ft.  Pain Descriptors / Indicators: Grimacing;Discomfort;Moaning Pain Intervention(s): Limited activity within patient's tolerance;Monitored during session    Home Living Family/patient expects to be discharged to:: Group home                      Prior Function Level of Independence: Needs assistance         Comments: Pt poor historian. Unclear on use of AD for mobility needs. Pt with  24 hour care in group home, prior to admission.      Hand Dominance        Extremity/Trunk Assessment   Upper Extremity Assessment Upper Extremity Assessment: Generalized weakness (Grossly 4-/5 to B UE's. )    Lower Extremity Assessment Lower Extremity Assessment: Generalized weakness (Grossly 3+/5 to B LE's. )       Communication   Communication: No difficulties  Cognition Arousal/Alertness: Awake/alert Behavior During Therapy: Agitated;Impulsive Overall Cognitive Status: Impaired/Different from baseline Area of Impairment: Safety/judgement;Following commands                         Safety/Judgement: Decreased awareness of safety     General Comments: Pt with cognitive impairments at baseline; with child-like demeanor. Pt with history of being aggressive and combative. Responds best to imaginary scenarios; for example: using "Luther Parody magic," and "abbracadabra," Responds well to motivation of cookies, candy, and coloring in princess book.       General Comments      Exercises     Assessment/Plan    PT Assessment Patient needs continued PT services  PT Problem List Decreased strength;Decreased range of motion;Decreased activity tolerance;Decreased balance;Decreased mobility;Decreased coordination;Decreased cognition;Decreased knowledge of use of DME;Decreased safety awareness;Decreased knowledge of precautions;Pain       PT Treatment Interventions DME instruction;Gait training;Functional mobility training;Therapeutic activities;Therapeutic exercise;Balance training;Neuromuscular re-education;Cognitive remediation;Patient/family education;Manual techniques    PT Goals (Current goals can be found in the Care Plan section)  Acute Rehab PT Goals Patient Stated Goal: to go home PT Goal Formulation: With patient Time For Goal Achievement: 07/04/17 Potential to Achieve Goals: Fair    Frequency Min 2X/week   Barriers to discharge Inaccessible home  environment      Co-evaluation               AM-PAC PT "6 Clicks" Daily Activity  Outcome Measure Difficulty turning over in bed (including adjusting bedclothes, sheets and blankets)?: Total Difficulty moving from lying on back to sitting on the side of the bed? : Total Difficulty sitting down on and standing up from a chair with arms (e.g., wheelchair, bedside commode, etc,.)?: Total Help needed moving to and from a bed to chair (including a wheelchair)?: Total Help needed walking in hospital room?: Total Help needed climbing 3-5 steps with a railing? : Total 6 Click Score: 6    End of Session Equipment Utilized During Treatment: Gait belt Activity Tolerance: Other (comment) (Limited by cognitive impairments. ) Patient left: in bed;with bed alarm set;with nursing/sitter in room;with call bell/phone within reach Nurse Communication: Mobility status;Need for lift equipment PT Visit Diagnosis: Unsteadiness on feet (R26.81);Other abnormalities of gait and mobility (R26.89);Muscle weakness (generalized) (M62.81);Pain Pain - part of body: Ankle and joints of foot (Bilateral )    Time: 0354-6568 PT Time Calculation (min) (ACUTE ONLY): 46 min   Charges:         PT  G Codes:        Oran Rein PT, SPT  Bevelyn Ngo 06/20/2017, 5:08 PM

## 2017-06-20 NOTE — Progress Notes (Signed)
[facility] at [place] NAME: Kelly [name]    MR#:  [id number]  DATE OF BIRTH:  1980-12-20  SUBJECTIVE:  CHIEF COMPLAINT:   Chief Complaint  Patient presents with  . Foot Pain   Patient awake and alert. Not talking much today. Waiting for physical therapy well.  REVIEW OF SYSTEMS:    Review of Systems  Constitutional: Negative for chills and fever.  HENT: Negative for sore throat.   Eyes: Negative for blurred vision, double vision and pain.  Respiratory: Negative for cough, hemoptysis, shortness of breath and wheezing.   Cardiovascular: Negative for chest pain, palpitations, orthopnea and leg swelling.  Gastrointestinal: Negative for abdominal pain, constipation, diarrhea, heartburn, nausea and vomiting.  Genitourinary: Negative for dysuria and hematuria.  Musculoskeletal: Positive for joint pain. Negative for back pain.  Skin: Negative for rash.  Neurological: Negative for sensory change, speech change, focal weakness and headaches.  Endo/Heme/Allergies: Does not bruise/bleed easily.  Psychiatric/Behavioral: Negative for depression. The patient is not nervous/anxious.     DRUG ALLERGIES:  No Known Allergies  VITALS:  Blood pressure (!) 134/91, pulse (!) 102, temperature 98.4 F (36.9 C), temperature source Oral, resp. rate 18, height 5' (1.524 m), weight 85.3 kg (188 lb), SpO2 99 %.  PHYSICAL EXAMINATION:   Physical Exam  GENERAL:  36 y.o.-year-old patient lying in the bed with no acute distress.  EYES: Pupils equal, round, reactive to light and accommodation. No scleral icterus. Extraocular muscles intact.  HEENT: Head atraumatic, normocephalic. Oropharynx and nasopharynx clear.  NECK:  Supple, no jugular venous distention. No thyroid enlargement, no tenderness.  LUNGS: Normal breath sounds bilaterally, no wheezing, rales, rhonchi. No use of accessory muscles of respiration.  CARDIOVASCULAR: S1, S2 normal. No murmurs, rubs,  or gallops.  ABDOMEN: Soft, nontender, nondistended. Bowel sounds present. No organomegaly or mass.  EXTREMITIES: No cyanosis, clubbing or edema b/l.   Tenderness b/l feet NEUROLOGIC: Cranial nerves II through XII are intact. No focal Motor or sensory deficits b/l.   PSYCHIATRIC: The patient is alert and awake. Flat affect SKIN: No obvious rash, lesion, or ulcer.   LABORATORY PANEL:   CBC  Recent Labs Lab 06/19/17 1030  WBC 6.6  HGB 10.8*  HCT 31.9*  PLT 192   ------------------------------------------------------------------------------------------------------------------ Chemistries   Recent Labs Lab 06/19/17 0729 06/19/17 1030  NA  --  137  K  --  4.5  CL  --  95*  CO2  --  27  GLUCOSE  --  144*  BUN  --  58*  CREATININE  --  12.07*  CALCIUM  --  9.3  MG 2.3  --    ------------------------------------------------------------------------------------------------------------------  Cardiac Enzymes No results for input(s): TROPONINI in the last 168 hours. ------------------------------------------------------------------------------------------------------------------  RADIOLOGY:  Ct Foot Left Wo Contrast  Result Date: 06/18/2017 CLINICAL DATA:  Fall on bilateral feet. Multiple fractures. Nonweightbearing. EXAM: CT OF THE LEFT FOOT WITHOUT CONTRAST TECHNIQUE: Multidetector CT imaging of the left foot was performed according to the standard protocol. Multiplanar CT image reconstructions were also generated. COMPARISON:  Radiograph earlier this day. FINDINGS: Bones/Joint/Cartilage Fractures at the base of the second, third, and fourth metatarsals extended the tarsal metatarsal articulations. No significant displacement. There is no widening of the Lisfranc articulation. Small osseous density adjacent to the dorsal medial cuneiform has sclerotic margins and is likely chronic. The tarsal bones are otherwise intact. The digits appear intact. No tibiotalar joint effusion.  Ligaments Suboptimally assessed by CT. Some Lisfranc  ligamentous fibers are visualized. Muscles and Tendons No acute abnormality. Soft tissues Soft tissue edema about the foot.  No confluent hematoma. IMPRESSION:  (LEFT FOOT): IMPRESSION:  (LEFT FOOT) 1. Fractures at the base of the second, third, and fourth metatarsals with intra-articular extension. No significant displacement. Lisfranc ligament is grossly intact. 2. Osseous density adjacent of the dorsal medial cuneiform has sclerotic margins and is likely chronic. Electronically Signed   By: Jeb Levering M.D.   On: 06/18/2017 22:05   Ct Foot Right Wo Contrast  Result Date: 06/18/2017 CLINICAL DATA:  Fall on bilateral feet. Multiple fractures. Nonweightbearing. EXAM: CT OF THE RIGHT FOOT WITHOUT CONTRAST TECHNIQUE: Multidetector CT imaging of the right foot was performed according to the standard protocol. Multiplanar CT image reconstructions were also generated. COMPARISON:  Radiograph earlier this day. FINDINGS: Bones/Joint/Cartilage Fractures at the base of the first through fourth proximal metatarsals. First metatarsal fracture is tiny chip fracture about the medial cortex extending to the tarsal metatarsal joint. Second, third, and fourth proximal metatarsal fractures are mildly comminuted, nondisplaced and extending to the tarsal metatarsal joints. No widening of the Lisfranc articulation. Mildly displaced distal fibular fracture. Tarsal bones and digits are intact. Tibial talar joint is maintained. Small tibiotalar joint effusion. Ligaments Suboptimally assessed by CT. One Lisfranc ligamentous fibers are visualized. Muscles and Tendons No acute abnormality. Soft tissues Diffuse soft tissue edema of the foot. Dense vascular calcifications. IMPRESSION: 1. Fractures at the base of the first through fourth metatarsals. The second, third, and fourth metatarsal fractures are mildly comminuted, nondisplaced and intra-articular. First metatarsal fracture  is a tiny chip fracture at the articular surface. Lisfranc ligament is grossly intact. 2. Mildly displaced distal fibular fracture. Electronically Signed   By: Jeb Levering M.D.   On: 06/18/2017 22:12   Dg Foot Complete Left  Result Date: 06/18/2017 CLINICAL DATA:  Fall with bilateral foot pain EXAM: LEFT FOOT - COMPLETE 3+ VIEW COMPARISON:  None. FINDINGS: Acute nondisplaced fractures involving the proximal shafts of the third and fourth metatarsals. No significant angulation. Vascular calcifications. IMPRESSION: Acute nondisplaced fractures involving the proximal third and fourth metatarsal Electronically Signed   By: Donavan Foil M.D.   On: 06/18/2017 20:18   Dg Foot Complete Right  Result Date: 06/18/2017 CLINICAL DATA:  Bilateral foot pain EXAM: RIGHT FOOT COMPLETE - 3+ VIEW COMPARISON:  None. FINDINGS: No dislocation is evident. Small intra-articular fracture involving the base of the first metatarsal. Nondisplaced fractures involving the base of the second metatarsal. Linear lucency involving the proximal shaft of the fourth and possibly fifth metatarsals. IMPRESSION: 1. Small intra-articular fracture involving the base of the first metatarsal 2. Additional nondisplaced fractures involving the base of second metatarsal and proximal shaft of fourth metatarsal. Possible subtle nondisplaced fracture involving proximal shaft of fifth metatarsal. Electronically Signed   By: Donavan Foil M.D.   On: 06/18/2017 20:16     ASSESSMENT AND PLAN:   This is a 36 y.o. female with a history of Bipolar disorder with significant cognitive impairment, combative and aggressive behavior, CKD, depression, diabetes, hypertension, hyperlipidemia  now being admitted with:  #. Intractable pain 2/2 multiple nondisplaced fractures of bilateral feet - PT eval - SW for consideration of SAR placement - Ortho consult appreciated. Boots ordered. Can bear weight. No surgery. Pain meds PRN.  #. History of end-stage  renal disease -Continue PhosLo, Sensipar - Nephro consult for HD. Appreciate help - Patient refused HD After getting it for 45 minutes yesterday  #. History of  BPD -Continue Depakote, Lexapro, Seroquel, trazodone  #. History of GERD - Continue Protonix  #. History of hypertension - Continue Norvasc, clonidine, hydralazine, Avapro, metoprolol  #. History of  Diabetes - Accuchecks achs with RISS coverage - Heart healthy, carb controlled diet.  All the records are reviewed and case discussed with Care Management/Social Worker Management plans discussed with the patient, family and they are in agreement.  CODE STATUS: FULL CODE  DVT Prophylaxis: SCDs  TOTAL TIME TAKING CARE OF THIS PATIENT: 35 minutes.   POSSIBLE D/C IN 1-2 DAYS, DEPENDING ON CLINICAL CONDITION.  Hillary Bow R M.D on 06/20/2017 at 1:37 PM  Between 7am to 6pm - Pager - [phone]  After 6pm go to [email or web address] - password [email or web address] [facility]  Office  [phone]  CC: Primary care physician; Remi Haggard, FNP  Note: This dictation was prepared with Dragon dictation along with smaller phrase technology. Any transcriptional errors that result from this process are unintentional.

## 2017-06-20 NOTE — Progress Notes (Signed)
PT is recommending SNF. Clinical Education officer, museum ([place]) discussed case with Engineer, agricultural who reported that SNF is not an option because of patient's behaviors and she requires a Actuary for dialysis. Patient will have to remain at [facility] until she can ambulate enough to discharge back to [facility] in [place]. CSW attempted to contact Bonita Quin, patient's care coordinator from previous admission at [facility] to inquire about [facility] payment to group home and paper work. Makisha did not answer and a voicemail was left. CSW will continue to follow and assist as needed.   [employer], LCSW [phone]

## 2017-06-21 LAB — GLUCOSE, CAPILLARY
GLUCOSE-CAPILLARY: 172 mg/dL — AB (ref 65–99)
GLUCOSE-CAPILLARY: 132 mg/dL — AB (ref 65–99)
Glucose-Capillary: 209 mg/dL — ABNORMAL HIGH (ref 65–99)
Glucose-Capillary: 255 mg/dL — ABNORMAL HIGH (ref 65–99)

## 2017-06-21 LAB — RENAL FUNCTION PANEL
ALBUMIN: 3 g/dL — AB (ref 3.5–5.0)
ANION GAP: 14 (ref 5–15)
BUN: 63 mg/dL — ABNORMAL HIGH (ref 6–20)
CO2: 26 mmol/L (ref 22–32)
Calcium: 9.3 mg/dL (ref 8.9–10.3)
Chloride: 96 mmol/L — ABNORMAL LOW (ref 101–111)
Creatinine, Ser: 13.77 mg/dL — ABNORMAL HIGH (ref 0.44–1.00)
GFR calc Af Amer: 4 mL/min — ABNORMAL LOW (ref >60)
GFR, EST NON AFRICAN AMERICAN: 3 mL/min — AB (ref >60)
Glucose, Bld: 133 mg/dL — ABNORMAL HIGH (ref 65–99)
PHOSPHORUS: 3 mg/dL (ref 2.5–4.6)
POTASSIUM: 5.4 mmol/L — AB (ref 3.5–5.1)
Sodium: 136 mmol/L (ref 135–145)

## 2017-06-21 LAB — CBC
HEMATOCRIT: 31.4 % — AB (ref 35.0–47.0)
HEMOGLOBIN: 10.8 g/dL — AB (ref 12.0–16.0)
MCH: 34.9 pg — ABNORMAL HIGH (ref 26.0–34.0)
MCHC: 34.3 g/dL (ref 32.0–36.0)
MCV: 101.8 fL — AB (ref 80.0–100.0)
Platelets: 196 10*3/uL (ref 150–440)
RBC: 3.08 MIL/uL — ABNORMAL LOW (ref 3.80–5.20)
RDW: 16.3 % — ABNORMAL HIGH (ref 11.5–14.5)
WBC: 6 10*3/uL (ref 3.6–11.0)

## 2017-06-21 MED ORDER — LORAZEPAM 2 MG/ML IJ SOLN
0.5000 mg | Freq: Once | INTRAMUSCULAR | Status: AC
  Administered 2017-06-21: 0.5 mg via INTRAVENOUS
  Filled 2017-06-21: qty 1

## 2017-06-21 MED ORDER — EPOETIN ALFA 4000 UNIT/ML IJ SOLN
4000.0000 [IU] | INTRAMUSCULAR | Status: DC
  Administered 2017-06-21 – 2017-07-01 (×5): 4000 [IU] via INTRAVENOUS
  Filled 2017-06-21 (×5): qty 1

## 2017-06-21 NOTE — Progress Notes (Signed)
Hd completed 

## 2017-06-21 NOTE — Progress Notes (Signed)
[facility] at [place] NAME: Kelly [name]    MR#:  [id number]  DATE OF BIRTH:  1981/08/21  SUBJECTIVE:  CHIEF COMPLAINT:   Chief Complaint  Patient presents with  . Foot Pain   Patient awake and alert. Not talking much. HD later today  REVIEW OF SYSTEMS:    Review of Systems  Constitutional: Negative for chills and fever.  HENT: Negative for sore throat.   Eyes: Negative for blurred vision, double vision and pain.  Respiratory: Negative for cough, hemoptysis, shortness of breath and wheezing.   Cardiovascular: Negative for chest pain, palpitations, orthopnea and leg swelling.  Gastrointestinal: Negative for abdominal pain, constipation, diarrhea, heartburn, nausea and vomiting.  Genitourinary: Negative for dysuria and hematuria.  Musculoskeletal: Positive for joint pain. Negative for back pain.  Skin: Negative for rash.  Neurological: Negative for sensory change, speech change, focal weakness and headaches.  Endo/Heme/Allergies: Does not bruise/bleed easily.  Psychiatric/Behavioral: Negative for depression. The patient is not nervous/anxious.     DRUG ALLERGIES:  No Known Allergies  VITALS:  Blood pressure 129/67, pulse 98, temperature 98.4 F (36.9 C), temperature source Oral, resp. rate 18, height 5' (1.524 m), weight 85.3 kg (188 lb), SpO2 97 %.  PHYSICAL EXAMINATION:   Physical Exam  GENERAL:  36 y.o.-year-old patient lying in the bed with no acute distress.  EYES: Pupils equal, round, reactive to light and accommodation. No scleral icterus. Extraocular muscles intact.  HEENT: Head atraumatic, normocephalic. Oropharynx and nasopharynx clear.  NECK:  Supple, no jugular venous distention. No thyroid enlargement, no tenderness.  LUNGS: Normal breath sounds bilaterally, no wheezing, rales, rhonchi. No use of accessory muscles of respiration.  CARDIOVASCULAR: S1, S2 normal. No murmurs, rubs, or gallops.  ABDOMEN: Soft,  nontender, nondistended. Bowel sounds present. No organomegaly or mass.  EXTREMITIES: No cyanosis, clubbing or edema b/l.   Tenderness b/l feet. Boots in place NEUROLOGIC: Cranial nerves II through XII are intact. No focal Motor or sensory deficits b/l.   PSYCHIATRIC: The patient is alert and awake. Flat affect SKIN: No obvious rash, lesion, or ulcer.   LABORATORY PANEL:   CBC  Recent Labs Lab 06/19/17 1030  WBC 6.6  HGB 10.8*  HCT 31.9*  PLT 192   ------------------------------------------------------------------------------------------------------------------ Chemistries   Recent Labs Lab 06/19/17 0729 06/19/17 1030  NA  --  137  K  --  4.5  CL  --  95*  CO2  --  27  GLUCOSE  --  144*  BUN  --  58*  CREATININE  --  12.07*  CALCIUM  --  9.3  MG 2.3  --    ------------------------------------------------------------------------------------------------------------------  Cardiac Enzymes No results for input(s): TROPONINI in the last 168 hours. ------------------------------------------------------------------------------------------------------------------  RADIOLOGY:  No results found.   ASSESSMENT AND PLAN:   This is a 36 y.o. female with a history of Bipolar disorder with significant cognitive impairment, combative and aggressive behavior, CKD, depression, diabetes, hypertension, hyperlipidemia  now being admitted with:  #. Intractable pain 2/2 multiple nondisplaced fractures of bilateral feet - PT eval - Ortho consult appreciated. Boots ordered. Can bear weight. No surgery. Pain meds PRN. SNF  #. History of end-stage renal disease -Continue PhosLo, Sensipar - Nephro consult for HD. Appreciate help - Patient refused HD After getting it for 45 minutes on Wednesday. Due to for HD later today  #. History of BPD -Continue Depakote, Lexapro, Seroquel, trazodone  #. History of GERD - Continue Protonix  #.  History of hypertension - Continue Norvasc,  clonidine, hydralazine, Avapro, metoprolol  #. History of  Diabetes - Accuchecks achs with RISS coverage - Heart healthy, carb controlled diet.  All the records are reviewed and case discussed with Care Management/Social Worker Management plans discussed with the patient.  CODE STATUS: FULL CODE  DVT Prophylaxis: SCDs  TOTAL TIME TAKING CARE OF THIS PATIENT: 35 minutes.   POSSIBLE D/C IN 1-2 DAYS, DEPENDING ON CLINICAL CONDITION.  Hillary Bow R M.D on 06/21/2017 at 1:34 PM  Between 7am to 6pm - Pager - [phone]  After 6pm go to [email or web address] - password [email or web address] [facility]  Office  [phone]  CC: Primary care physician; Remi Haggard, FNP  Note: This dictation was prepared with Dragon dictation along with smaller phrase technology. Any transcriptional errors that result from this process are unintentional.

## 2017-06-21 NOTE — Progress Notes (Signed)
Ok per Dr. Darvin Neighbours to hold morning meds d/t dialysis

## 2017-06-21 NOTE — Progress Notes (Signed)
[facility]  ROUNDING NOTE   Subjective:   Hemodialysis for later today.   Objective:  Vital signs in last 24 hours:  Temp:  [98.4 F (36.9 C)-99 F (37.2 C)] 98.4 F (36.9 C) (07/27 0912) Pulse Rate:  [94-103] 98 (07/27 0912) Resp:  [18] 18 (07/27 0912) BP: (113-129)/(62-86) 129/67 (07/27 0912) SpO2:  [97 %-100 %] 97 % (07/27 0912)  Weight change:  Filed Weights   06/19/17 1022 06/19/17 1349 06/20/17 0623  Weight: 87.8 kg (193 lb 9 oz) 87.8 kg (193 lb 9 oz) 85.3 kg (188 lb)    Intake/Output: I/O last 3 completed shifts: In: 240 [P.O.:240] Out: 0    Intake/Output this shift:  Total I/O In: 120 [P.O.:120] Out: -   Physical Exam: General: NAD,   Head: Normocephalic, atraumatic. Moist oral mucosal membranes  Eyes: Anicteric, PERRL  Neck: Supple, trachea midline  Lungs:  Clear to auscultation  Heart: Regular rate and rhythm  Abdomen:  Soft, nontender,   Extremities: trace peripheral edema.  Neurologic: Nonfocal, moving all four extremities  Skin: No lesions  Access: Left AVF    Basic Metabolic Panel:  Recent Labs Lab 06/19/17 0729 06/19/17 1030  NA  --  137  K  --  4.5  CL  --  95*  CO2  --  27  GLUCOSE  --  144*  BUN  --  58*  CREATININE  --  12.07*  CALCIUM  --  9.3  MG 2.3  --   PHOS 4.3 4.0    Liver Function Tests:  Recent Labs Lab 06/19/17 1030  ALBUMIN 3.5   No results for input(s): LIPASE, AMYLASE in the last 168 hours. No results for input(s): AMMONIA in the last 168 hours.  CBC:  Recent Labs Lab 06/19/17 1030  WBC 6.6  HGB 10.8*  HCT 31.9*  MCV 101.1*  PLT 192    Cardiac Enzymes: No results for input(s): CKTOTAL, CKMB, CKMBINDEX, TROPONINI in the last 168 hours.  BNP: Invalid input(s): POCBNP  CBG:  Recent Labs Lab 06/20/17 1212 06/20/17 1742 06/20/17 2231 06/21/17 0908 06/21/17 1226  GLUCAP 147* 169* 97 209* 172*    Microbiology: Results for orders placed or performed during the hospital  encounter of 06/18/17  MRSA PCR Screening     Status: None   Collection Time: 06/19/17 10:16 PM  Result Value Ref Range Status   MRSA by PCR NEGATIVE NEGATIVE Final    Comment:        The GeneXpert MRSA Assay (FDA approved for NASAL specimens only), is one component of a comprehensive MRSA colonization surveillance program. It is not intended to diagnose MRSA infection nor to guide or monitor treatment for MRSA infections.     Coagulation Studies: No results for input(s): LABPROT, INR in the last 72 hours.  Urinalysis: No results for input(s): COLORURINE, LABSPEC, PHURINE, GLUCOSEU, HGBUR, BILIRUBINUR, KETONESUR, PROTEINUR, UROBILINOGEN, NITRITE, LEUKOCYTESUR in the last 72 hours.  Invalid input(s): APPERANCEUR    Imaging: No results found.   Medications:    . amLODipine  10 mg Oral QPM  . aspirin  81 mg Oral Daily  . calcium acetate  1,334 mg Oral BID WC  . cinacalcet  60 mg Oral Daily  . cloNIDine  0.1 mg Oral BID  . divalproex  1,000 mg Oral QHS  . docusate sodium  100 mg Oral QHS  . escitalopram  15 mg Oral Daily  . folic acid  2 mg Oral QHS  . heparin  5,000 Units Subcutaneous Q8H  . hydrALAZINE  25 mg Oral TID  . insulin aspart  0-15 Units Subcutaneous TID WC  . insulin aspart  0-5 Units Subcutaneous QHS  . insulin aspart  3 Units Subcutaneous TID WC  . irbesartan  300 mg Oral QHS  . metoprolol tartrate  50 mg Oral BID  . multivitamin  1 tablet Oral Daily  . multivitamin with minerals  1 tablet Oral Daily  . pantoprazole  40 mg Oral Daily  . patiromer  8.4 g Oral Q24H  . QUEtiapine  200 mg Oral BID  . traZODone  100 mg Oral QHS  . vitamin B-12  1,000 mcg Oral QHS   acetaminophen **OR** acetaminophen, albuterol, bisacodyl, ipratropium, magnesium citrate, morphine injection, ondansetron **OR** ondansetron (ZOFRAN) IV, oxyCODONE, senna-docusate  Assessment/ Plan:  [name] is a 36 y.o. black female  with developmental delay, ESRD on  hemodialysis, anemia, hypertension, schizophrenia, diabetes mellitus type II  [facility] MWF [address]  1. End Stage Renal Disease: on hemodialysis MWF schedule. Only 45 minutes of treatment Wednesday - Next treatment for later today. Orders prepared.   2. Hypertension: elevated diastolic - Continue Amlodipine, clonidine, hydralazine, irbesartan, metoprolol  3. Anemia of chronic kidney disease: hemoglobin 10.8 - Continue Epogen with dialysis.  4. Secondary Hyperparathyroidism: Patient has not required Binder therapy   LOS: 3 Rose Hegner 7/27/201812:56 PM

## 2017-06-21 NOTE — Progress Notes (Signed)
Post dialysis assessment 

## 2017-06-21 NOTE — Progress Notes (Signed)
HD STARTED  

## 2017-06-21 NOTE — Progress Notes (Signed)
Clinical Education officer, museum (CSW) received a call back from [employer], patient's care coordinator from previous admission at [facility]. Per care coordinator [place] authorization is current for the group home from 06/11/17 through 12/07/17. Per care coordinator she spoke with group home owner [name] who stated that he is holding patient's bed at the group home until she can ambulate enough to return there and is satisfied with the authorization from [facility]. Plan is for patient to remain at [facility] until she can ambulate enough to discharge back to [facility] in [place]. CSW will continue to follow and assist as needed.   [employer], LCSW [phone]

## 2017-06-21 NOTE — Progress Notes (Deleted)
   Subjective:    Patient reports pain as 4 on 0-10 scale.   Patient is well, and has had no acute complaints or problems Patient did extremely well yesterday with physical therapy. Plan is to go Home after hospital stay. no nausea and no vomiting Patient denies any chest pains or shortness of breath. Objective: Vital signs in last 24 hours: Temp:  [98.5 F (36.9 C)-99 F (37.2 C)] 99 F (37.2 C) (07/26 2229) Pulse Rate:  [94-103] 103 (07/26 2229) Resp:  [18] 18 (07/26 2229) BP: (113-134)/(62-91) 113/62 (07/26 2229) SpO2:  [99 %-100 %] 99 % (07/26 2229) well approximated incision Heels are non tender and elevated off the bed using rolled towels Intake/Output from previous day: 07/26 0701 - 07/27 0700 In: 240 [P.O.:240] Out: -  Intake/Output this shift: No intake/output data recorded.   Recent Labs  06/19/17 1030  HGB 10.8*    Recent Labs  06/19/17 1030  WBC 6.6  RBC 3.15*  HCT 31.9*  PLT 192    Recent Labs  06/19/17 1030  NA 137  K 4.5  CL 95*  CO2 27  BUN 58*  CREATININE 12.07*  GLUCOSE 144*  CALCIUM 9.3   No results for input(s): LABPT, INR in the last 72 hours.  EXAM General - Patient is Alert, Appropriate and Oriented Extremity - Neurologically intact Neurovascular intact Sensation intact distally Intact pulses distally Dorsiflexion/Plantar flexion intact No cellulitis present Compartment soft Dressing - dressing C/D/I Motor Function - intact, moving foot and toes well on exam.    Past Medical History:  Diagnosis Date  . Alcohol abuse    chronic  . Bipolar disorder ([place])   . Chronic kidney disease   . Cognitive changes   . Cognitive impairment   . Depression   . Depression   . Diabetes mellitus without complication ([place])   . Elevated lipids   . Hyperparathyroidism ([place])   . Hypertension   . Staph aureus infection    A/V fistula  . UTI (lower urinary tract infection)     Assessment/Plan:    Active Problems:   Multiple  fractures of both lower extremities  Estimated body mass index is 36.72 kg/m as calculated from the following:   Height as of this encounter: 5' (1.524 m).   Weight as of this encounter: 85.3 kg (188 lb). Up with therapy Discharge home with home health  Labs: Were reviewed and acceptable DVT Prophylaxis - Lovenox, Foot Pumps and TED hose Weight-Bearing as tolerated to right leg Hemovac discontinued on today's visit Patient needs a bowel movement today. Please wash the operative leg and apply TED stockings to both legs. Please change dressing prior to patient being discharged. Please give the patient 2 extra honeycomb dressings to take home.  Jillyn Ledger. [place] 06/21/2017, 7:23 AM

## 2017-06-21 NOTE — Progress Notes (Signed)
Patient refuses blood sugar check and vital signs this morning. This nurse will try later.

## 2017-06-21 NOTE — Progress Notes (Signed)
PRE DIALYSIS ASSESSMENT 

## 2017-06-22 LAB — GLUCOSE, CAPILLARY
GLUCOSE-CAPILLARY: 166 mg/dL — AB (ref 65–99)
GLUCOSE-CAPILLARY: 134 mg/dL — AB (ref 65–99)
Glucose-Capillary: 182 mg/dL — ABNORMAL HIGH (ref 65–99)
Glucose-Capillary: 64 mg/dL — ABNORMAL LOW (ref 65–99)
Glucose-Capillary: 175 mg/dL — ABNORMAL HIGH (ref 65–99)

## 2017-06-22 NOTE — Progress Notes (Signed)
Physical Therapy Treatment Patient Details Name: Kelly [name] MRN: [id number] DOB: 01/12/1981 Today's Date: 06/22/2017    History of Present Illness Pt is a 36 yo F, admitted to acute care on 7/24, with multiple fx in B ft and distal fibula of R ft. Prior to admission, pt required 24 hr assist, and it is unclear of AD with amb, as pt is poor historian. PMH: bipolar disorder with significant cognitive impairment, combative, and aggressive behavior, CKD, depression, diabetes, HTN, HLD.     PT Comments    Pt in bed.  Eyes closed but would yell out and open eyes when light touch given to arms.  Pt generally uncooperative despite encouragement and trying multiple techniques to get pt engaged and cooperative with session.  She refused to have blankets removed initially but was able to get them off while donning  Longer walking boot on R and shorter cam boot on left.  She would pick LE's up with encouragement to donn boots.  She would not participate in LE ex's despite AAROM and at times resisted.  She refused all attempts to get to edge of bed.  Eyes remained closed during session.  Out of bed mobility was deferred for pt and staff safety due to resistance.  Walking boots were then removed and LE's placed up on pillows to elevate.  She then stated "Roll me over so I can sleep"  Encouraged pt to participate in bed mobility skills but she stated "No, you roll me".  When she was again encouraged to help she said "I'll just lay right her then" and turned her head away and closed her eyes.  Distraction techniques and techniques to make session more playful used without success.    Follow Up Recommendations  SNF     Equipment Recommendations  Rolling walker with 5" wheels    Recommendations for Other Services       Precautions / Restrictions Precautions Precautions: Fall Required Braces or Orthoses: Other Brace/Splint Other Brace/Splint: long cam walker boot on R and short cam walker boot on  L. Restrictions Weight Bearing Restrictions: Yes RLE Weight Bearing: Weight bearing as tolerated LLE Weight Bearing: Weight bearing as tolerated    Mobility  Bed Mobility                  Transfers                    Ambulation/Gait                 Stairs            Wheelchair Mobility    Modified Rankin (Stroke Patients Only)       Balance                                            Cognition Arousal/Alertness: Awake/alert   Overall Cognitive Status: No family/caregiver present to determine baseline cognitive functioning                                 General Comments: Pt with cognitive impairments at baseline; with child-like demeanor. Pt with history of being aggressive and combative. Responds best to imaginary scenarios; for example: using "Luther Parody magic," and "abbracadabra," Responds well to motivation of cookies, candy, and coloring in princess book.  Exercises Other Exercises Other Exercises: Pt poor particiaption in therapy limiting exercises.  See note for details    General Comments        Pertinent Vitals/Pain Pain Assessment: Faces Faces Pain Scale: Hurts a little bit Pain Location: B feet.  seemed generally comfortable. Pain Descriptors / Indicators: Grimacing;Discomfort;Moaning Pain Intervention(s): Limited activity within patient's tolerance    Home Living                      Prior Function            PT Goals (current goals can now be found in the care plan section) Progress towards PT goals: Not progressing toward goals - comment    Frequency    Min 2X/week      PT Plan Current plan remains appropriate    Co-evaluation              AM-PAC PT "6 Clicks" Daily Activity  Outcome Measure  Difficulty turning over in bed (including adjusting bedclothes, sheets and blankets)?: Total Difficulty moving from lying on back to sitting on the side of  the bed? : Total Difficulty sitting down on and standing up from a chair with arms (e.g., wheelchair, bedside commode, etc,.)?: Total Help needed moving to and from a bed to chair (including a wheelchair)?: Total Help needed walking in hospital room?: Total Help needed climbing 3-5 steps with a railing? : Total 6 Click Score: 6    End of Session   Activity Tolerance: Other (comment) Patient left: in bed;with bed alarm set;with call bell/phone within reach Nurse Communication: Other (comment) Pain - part of body: Ankle and joints of foot     Time: 1139-1200 PT Time Calculation (min) (ACUTE ONLY): 21 min  Charges:  $Therapeutic Activity: 8-22 mins                    G Codes:       Chesley Noon, PTA 06/22/17, 12:15 PM

## 2017-06-22 NOTE — Progress Notes (Signed)
[facility] NAME: Florina Glas    MR#:  [id number]  DATE OF BIRTH:  [birth year]  SUBJECTIVE:  CHIEF COMPLAINT:   Chief Complaint  Patient presents with  . Foot Pain   Patient awake and alert.  Refuses meds and HD sometimes.  Pain in feet  REVIEW OF SYSTEMS:    Review of Systems  Constitutional: Negative for chills and fever.  HENT: Negative for sore throat.   Eyes: Negative for blurred vision, double vision and pain.  Respiratory: Negative for cough, hemoptysis, shortness of breath and wheezing.   Cardiovascular: Negative for chest pain, palpitations, orthopnea and leg swelling.  Gastrointestinal: Negative for abdominal pain, constipation, diarrhea, heartburn, nausea and vomiting.  Genitourinary: Negative for dysuria and hematuria.  Musculoskeletal: Positive for joint pain. Negative for back pain.  Skin: Negative for rash.  Neurological: Negative for sensory change, speech change, focal weakness and headaches.  Endo/Heme/Allergies: Does not bruise/bleed easily.  Psychiatric/Behavioral: Negative for depression. The patient is not nervous/anxious.     DRUG ALLERGIES:  No Known Allergies  VITALS:  Blood pressure 107/63, pulse 98, temperature 98.1 F (36.7 C), temperature source Oral, resp. rate 19, height 5' (1.524 m), weight 88.7 kg (195 lb 8.8 oz), SpO2 (!) 86 %.  PHYSICAL EXAMINATION:   Physical Exam  GENERAL:  36 y.o.-year-old patient lying in the bed with no acute distress.  EYES: Pupils equal, round, reactive to light and accommodation. No scleral icterus. Extraocular muscles intact.  HEENT: Head atraumatic, normocephalic. Oropharynx and nasopharynx clear.  NECK:  Supple, no jugular venous distention. No thyroid enlargement, no tenderness.  LUNGS: Normal breath sounds bilaterally, no wheezing, rales, rhonchi. No use of accessory muscles of respiration.  CARDIOVASCULAR: S1, S2 normal. No murmurs, rubs, or  gallops.  ABDOMEN: Soft, nontender, nondistended. Bowel sounds present. No organomegaly or mass.  EXTREMITIES: No cyanosis, clubbing or edema b/l.   Tenderness b/l feet. NEUROLOGIC: Cranial nerves II through XII are intact. No focal Motor or sensory deficits b/l.   PSYCHIATRIC: The patient is alert and awake. Flat affect SKIN: No obvious rash, lesion, or ulcer.   LABORATORY PANEL:   CBC  Recent Labs Lab 06/21/17 1553  WBC 6.0  HGB 10.8*  HCT 31.4*  PLT 196   ------------------------------------------------------------------------------------------------------------------ Chemistries   Recent Labs Lab 06/19/17 0729  06/21/17 1420  NA  --   < > 136  K  --   < > 5.4*  CL  --   < > 96*  CO2  --   < > 26  GLUCOSE  --   < > 133*  BUN  --   < > 63*  CREATININE  --   < > 13.77*  CALCIUM  --   < > 9.3  MG 2.3  --   --   < > = values in this interval not displayed. ------------------------------------------------------------------------------------------------------------------  Cardiac Enzymes No results for input(s): TROPONINI in the last 168 hours. ------------------------------------------------------------------------------------------------------------------  RADIOLOGY:  No results found.   ASSESSMENT AND PLAN:   This is a 36 y.o. female with a history of Bipolar disorder with significant cognitive impairment, combative and aggressive behavior, CKD, depression, diabetes, hypertension, hyperlipidemia  now being admitted with:  #. Intractable pain 2/2 multiple nondisplaced fractures of bilateral feet - PT eval done. Needs SNF - Ortho consult appreciated. Boots ordered. Can bear weight. No surgery. Pain meds PRN.  #. History of end-stage renal disease -Continue PhosLo, Sensipar - Nephro consult  for HD. Appreciate help - Received only partial HD as she refused after starting it  #. History of BPD -Continue Depakote, Lexapro, Seroquel, trazodone  #. History of  GERD - Continue Protonix  #. History of hypertension - Continue Norvasc, clonidine, hydralazine, Avapro, metoprolol  #. History of  Diabetes SSI  All the records are reviewed and case discussed with Care Management/Social Worker Management plans discussed with the patient.  CODE STATUS: FULL CODE  DVT Prophylaxis: SCDs  TOTAL TIME TAKING CARE OF THIS PATIENT: 35 minutes.   POSSIBLE D/C IN 1-2 DAYS, DEPENDING ON CLINICAL CONDITION.  Hillary Bow R M.D on 06/22/2017 at 9:36 AM  Between 7am to 6pm - Pager - [phone]  After 6pm go to [email or web address] - password [email or web address] [email or web address]  Office  [phone]  CC: Primary care physician; Remi Haggard, FNP  Note: This dictation was prepared with Dragon dictation along with smaller phrase technology. Any transcriptional errors that result from this process are unintentional.

## 2017-06-22 NOTE — Progress Notes (Signed)
[facility]  ROUNDING NOTE   Subjective:   Hemodialysis treatment yesterday. Completed 1 hour of treatment. Patient refusing more dialysis treatment at this time.   Objective:  Vital signs in last 24 hours:  Temp:  [98.1 F (36.7 C)-98.5 F (36.9 C)] 98.1 F (36.7 C) (07/28 0752) Pulse Rate:  [98-117] 98 (07/28 0752) Resp:  [18-19] 19 (07/28 0752) BP: (98-132)/(63-78) 107/63 (07/28 0752) SpO2:  [86 %-98 %] 86 % (07/28 0752) Weight:  [88.7 kg (195 lb 8.8 oz)-92 kg (202 lb 13.2 oz)] 88.7 kg (195 lb 8.8 oz) (07/27 1630)  Weight change:  Filed Weights   06/20/17 0623 06/21/17 1425 06/21/17 1630  Weight: 85.3 kg (188 lb) 92 kg (202 lb 13.2 oz) 88.7 kg (195 lb 8.8 oz)    Intake/Output: I/O last 3 completed shifts: In: 120 [P.O.:120] Out: 1064 [Other:1064]   Intake/Output this shift:  Total I/O In: 240 [P.O.:240] Out: -   Physical Exam: General: NAD,   Head: Normocephalic, atraumatic. Moist oral mucosal membranes  Eyes: Anicteric, PERRL  Neck: Supple, trachea midline  Lungs:  Clear to auscultation  Heart: Regular rate and rhythm  Abdomen:  Soft, nontender  Extremities: trace peripheral edema, foot braces bilaterally  Neurologic: Nonfocal, moving all four extremities  Skin: No lesions  Access: Left AVF    Basic Metabolic Panel:  Recent Labs Lab 06/19/17 0729 06/19/17 1030 06/21/17 1420  NA  --  137 136  K  --  4.5 5.4*  CL  --  95* 96*  CO2  --  27 26  GLUCOSE  --  144* 133*  BUN  --  58* 63*  CREATININE  --  12.07* 13.77*  CALCIUM  --  9.3 9.3  MG 2.3  --   --   PHOS 4.3 4.0 3.0    Liver Function Tests:  Recent Labs Lab 06/19/17 1030 06/21/17 1420  ALBUMIN 3.5 3.0*   No results for input(s): LIPASE, AMYLASE in the last 168 hours. No results for input(s): AMMONIA in the last 168 hours.  CBC:  Recent Labs Lab 06/19/17 1030 06/21/17 1553  WBC 6.6 6.0  HGB 10.8* 10.8*  HCT 31.9* 31.4*  MCV 101.1* 101.8*  PLT 192 196     Cardiac Enzymes: No results for input(s): CKTOTAL, CKMB, CKMBINDEX, TROPONINI in the last 168 hours.  BNP: Invalid input(s): POCBNP  CBG:  Recent Labs Lab 06/21/17 0908 06/21/17 1226 06/21/17 1700 06/21/17 2140 06/22/17 0740  GLUCAP 209* 172* 132* 255* 166*    Microbiology: Results for orders placed or performed during the hospital encounter of 06/18/17  MRSA PCR Screening     Status: None   Collection Time: 06/19/17 10:16 PM  Result Value Ref Range Status   MRSA by PCR NEGATIVE NEGATIVE Final    Comment:        The GeneXpert MRSA Assay (FDA approved for NASAL specimens only), is one component of a comprehensive MRSA colonization surveillance program. It is not intended to diagnose MRSA infection nor to guide or monitor treatment for MRSA infections.     Coagulation Studies: No results for input(s): LABPROT, INR in the last 72 hours.  Urinalysis: No results for input(s): COLORURINE, LABSPEC, PHURINE, GLUCOSEU, HGBUR, BILIRUBINUR, KETONESUR, PROTEINUR, UROBILINOGEN, NITRITE, LEUKOCYTESUR in the last 72 hours.  Invalid input(s): APPERANCEUR    Imaging: No results found.   Medications:    . amLODipine  10 mg Oral QPM  . aspirin  81 mg Oral Daily  . calcium acetate  1,334 mg Oral BID WC  . cinacalcet  60 mg Oral Daily  . cloNIDine  0.1 mg Oral BID  . divalproex  1,000 mg Oral QHS  . docusate sodium  100 mg Oral QHS  . [START ON 06/24/2017] epoetin (EPOGEN/PROCRIT) injection  4,000 Units Intravenous Q M,W,F-HD  . escitalopram  15 mg Oral Daily  . folic acid  2 mg Oral QHS  . heparin  5,000 Units Subcutaneous Q8H  . hydrALAZINE  25 mg Oral TID  . insulin aspart  0-15 Units Subcutaneous TID WC  . insulin aspart  0-5 Units Subcutaneous QHS  . insulin aspart  3 Units Subcutaneous TID WC  . irbesartan  300 mg Oral QHS  . metoprolol tartrate  50 mg Oral BID  . multivitamin  1 tablet Oral Daily  . multivitamin with minerals  1 tablet Oral Daily  .  pantoprazole  40 mg Oral Daily  . patiromer  8.4 g Oral Q24H  . QUEtiapine  200 mg Oral BID  . traZODone  100 mg Oral QHS  . vitamin B-12  1,000 mcg Oral QHS   acetaminophen **OR** acetaminophen, albuterol, bisacodyl, ipratropium, magnesium citrate, morphine injection, ondansetron **OR** ondansetron (ZOFRAN) IV, oxyCODONE, senna-docusate  Assessment/ Plan:  [name] is a 36 y.o. black female  with developmental delay, ESRD on hemodialysis, anemia, hypertension, schizophrenia, diabetes mellitus type II  [facility] MWF [address]  1. End Stage Renal Disease: on hemodialysis MWF schedule. Only 60 minutes of treatment Friday - Next treatment for Monday.   2. Hypertension: blood pressure at goal.  - Continue Amlodipine, clonidine, hydralazine, irbesartan, metoprolol  3. Anemia of chronic kidney disease: hemoglobin 10.8 - Continue Epogen with dialysis. Did not get epo on Friday's treatment.   4. Secondary Hyperparathyroidism: Patient has not required Binder therapy   LOS: [place], [place] 7/28/201810:45 AM

## 2017-06-23 LAB — GLUCOSE, CAPILLARY
GLUCOSE-CAPILLARY: 166 mg/dL — AB (ref 65–99)
Glucose-Capillary: 166 mg/dL — ABNORMAL HIGH (ref 65–99)
Glucose-Capillary: 44 mg/dL — CL (ref 65–99)
Glucose-Capillary: 175 mg/dL — ABNORMAL HIGH (ref 65–99)
Glucose-Capillary: 226 mg/dL — ABNORMAL HIGH (ref 65–99)

## 2017-06-23 MED ORDER — DEXTROSE 50 % IV SOLN
1.0000 | Freq: Once | INTRAVENOUS | Status: AC
  Administered 2017-06-23: 50 mL via INTRAVENOUS
  Filled 2017-06-23: qty 50
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
## 2017-06-23 NOTE — Progress Notes (Signed)
Patient would not eat her Snickers so this nurse gave an amp of D50. Was unable to override pull in the pyxis so had to add an order. Text paged Dr. Darvin Neighbours to advise.

## 2017-06-23 NOTE — Progress Notes (Signed)
Blood sugar 175 following amp of D50.

## 2017-06-23 NOTE — Progress Notes (Signed)
Patient refuses her evening meds.

## 2017-06-23 NOTE — Progress Notes (Signed)
Per Dr. Darvin Neighbours. Discontinue the insulin aspart 3 units. 3 times daily with meals.

## 2017-06-23 NOTE — Progress Notes (Signed)
Pt. Refused all HS meds

## 2017-06-23 NOTE — Progress Notes (Signed)
[place] at [place] NAME: Kelly [name]    MR#:  [id number]  DATE OF BIRTH:  1981/09/19  SUBJECTIVE:  CHIEF COMPLAINT:   Chief Complaint  Patient presents with  . Foot Pain    Refuses meds and HD sometimes. Refusing PT Pain in feet  REVIEW OF SYSTEMS:    Review of Systems  Constitutional: Negative for chills and fever.  HENT: Negative for sore throat.   Eyes: Negative for blurred vision, double vision and pain.  Respiratory: Negative for cough, hemoptysis, shortness of breath and wheezing.   Cardiovascular: Negative for chest pain, palpitations, orthopnea and leg swelling.  Gastrointestinal: Negative for abdominal pain, constipation, diarrhea, heartburn, nausea and vomiting.  Genitourinary: Negative for dysuria and hematuria.  Musculoskeletal: Positive for joint pain. Negative for back pain.  Skin: Negative for rash.  Neurological: Negative for sensory change, speech change, focal weakness and headaches.  Endo/Heme/Allergies: Does not bruise/bleed easily.  Psychiatric/Behavioral: Negative for depression. The patient is not nervous/anxious.     DRUG ALLERGIES:  No Known Allergies  VITALS:  Blood pressure (!) 143/53, pulse 85, temperature 98.2 F (36.8 C), temperature source Oral, resp. rate 19, height 5' (1.524 m), weight 88.7 kg (195 lb 8.8 oz), SpO2 96 %.  PHYSICAL EXAMINATION:   Physical Exam  GENERAL:  36 y.o.-year-old patient lying in the bed with no acute distress.  EYES: Pupils equal, round, reactive to light and accommodation. No scleral icterus. Extraocular muscles intact.  HEENT: Head atraumatic, normocephalic. Oropharynx and nasopharynx clear.  NECK:  Supple, no jugular venous distention. No thyroid enlargement, no tenderness.  LUNGS: Normal breath sounds bilaterally, no wheezing, rales, rhonchi. No use of accessory muscles of respiration.  CARDIOVASCULAR: S1, S2 normal. No murmurs, rubs, or gallops.  ABDOMEN:  Soft, nontender, nondistended. Bowel sounds present. No organomegaly or mass.  EXTREMITIES: No cyanosis, clubbing or edema b/l.   Tenderness b/l feet. NEUROLOGIC: Cranial nerves II through XII are intact. No focal Motor or sensory deficits b/l.   PSYCHIATRIC: The patient is alert and awake. Flat affect SKIN: No obvious rash, lesion, or ulcer.   LABORATORY PANEL:   CBC  Recent Labs Lab 06/21/17 1553  WBC 6.0  HGB 10.8*  HCT 31.4*  PLT 196   ------------------------------------------------------------------------------------------------------------------ Chemistries   Recent Labs Lab 06/19/17 0729  06/21/17 1420  NA  --   < > 136  K  --   < > 5.4*  CL  --   < > 96*  CO2  --   < > 26  GLUCOSE  --   < > 133*  BUN  --   < > 63*  CREATININE  --   < > 13.77*  CALCIUM  --   < > 9.3  MG 2.3  --   --   < > = values in this interval not displayed. ------------------------------------------------------------------------------------------------------------------  Cardiac Enzymes No results for input(s): TROPONINI in the last 168 hours. ------------------------------------------------------------------------------------------------------------------  RADIOLOGY:  No results found.   ASSESSMENT AND PLAN:   This is a 36 y.o. female with a history of Bipolar disorder with significant cognitive impairment, combative and aggressive behavior, CKD, depression, diabetes, hypertension, hyperlipidemia  now being admitted with:  #. Intractable pain 2/2 multiple nondisplaced fractures of bilateral feet - Patient refusing PT - Ortho consult appreciated. Boots ordered. Can bear weight. No surgery. Pain meds PRN.  #. History of end-stage renal disease -Continue PhosLo, Sensipar - Nephro consult for HD. Appreciate help -  Received only partial HD as she refused after starting it. Scheduled again for Monday  #. History of BPD -Continue Depakote, Lexapro, Seroquel, trazodone  #.  History of GERD - Continue Protonix  #. History of hypertension - Continue Norvasc, clonidine, hydralazine, Avapro, metoprolol  #. History of  Diabetes SSI  All the records are reviewed and case discussed with Care Management/Social Worker Management plans discussed with the patient.  CODE STATUS: FULL CODE  DVT Prophylaxis: SCDs  TOTAL TIME TAKING CARE OF THIS PATIENT: 35 minutes.   POSSIBLE D/C IN 1-2 DAYS, DEPENDING ON CLINICAL CONDITION.  Hillary Bow R M.D on 06/23/2017 at 12:01 PM  Between 7am to 6pm - Pager - [phone]  After 6pm go to [email or web address] - password [email or web address] [facility]  Office  [phone]  CC: Primary care physician; Remi Haggard, FNP  Note: This dictation was prepared with Dragon dictation along with smaller phrase technology. Any transcriptional errors that result from this process are unintentional.

## 2017-06-23 NOTE — Progress Notes (Signed)
Patient has a blood sugar of 44 and refused juice; insisted upon candy. Nurse got her a Snickers bar. Recheck sugar 15 minuites

## 2017-06-24 ENCOUNTER — Ambulatory Visit: Payer: Medicaid Other | Admitting: Podiatry

## 2017-06-24 LAB — GLUCOSE, CAPILLARY
GLUCOSE-CAPILLARY: 222 mg/dL — AB (ref 65–99)
Glucose-Capillary: 174 mg/dL — ABNORMAL HIGH (ref 65–99)
Glucose-Capillary: 135 mg/dL — ABNORMAL HIGH (ref 65–99)
Glucose-Capillary: 205 mg/dL — ABNORMAL HIGH (ref 65–99)

## 2017-06-24 LAB — CBC
HCT: 29.3 % — ABNORMAL LOW (ref 35.0–47.0)
Hemoglobin: 9.7 g/dL — ABNORMAL LOW (ref 12.0–16.0)
MCH: 33.7 pg (ref 26.0–34.0)
MCHC: 33.1 g/dL (ref 32.0–36.0)
MCV: 101.9 fL — ABNORMAL HIGH (ref 80.0–100.0)
PLATELETS: 205 10*3/uL (ref 150–440)
RBC: 2.87 MIL/uL — AB (ref 3.80–5.20)
RDW: 15.6 % — ABNORMAL HIGH (ref 11.5–14.5)
WBC: 4 10*3/uL (ref 3.6–11.0)

## 2017-06-24 LAB — RENAL FUNCTION PANEL
ALBUMIN: 3.3 g/dL — AB (ref 3.5–5.0)
ANION GAP: 14 (ref 5–15)
BUN: 83 mg/dL — ABNORMAL HIGH (ref 6–20)
CALCIUM: 9.5 mg/dL (ref 8.9–10.3)
CO2: 26 mmol/L (ref 22–32)
CREATININE: 15.01 mg/dL — AB (ref 0.44–1.00)
Chloride: 93 mmol/L — ABNORMAL LOW (ref 101–111)
GFR calc Af Amer: 3 mL/min — ABNORMAL LOW (ref >60)
GFR, EST NON AFRICAN AMERICAN: 3 mL/min — AB (ref >60)
Glucose, Bld: 202 mg/dL — ABNORMAL HIGH (ref 65–99)
PHOSPHORUS: 3.1 mg/dL (ref 2.5–4.6)
Potassium: 6.8 mmol/L (ref 3.5–5.1)
SODIUM: 133 mmol/L — AB (ref 135–145)

## 2017-06-24 LAB — POTASSIUM: POTASSIUM: 5.6 mmol/L — AB (ref 3.5–5.1)

## 2017-06-24 NOTE — Progress Notes (Signed)
Hd start, no c/o, stable, pt alert, calm
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
## 2017-06-24 NOTE — Progress Notes (Signed)
[place] at [place] NAME: Kelly [name]    MR#:  [id number]  DATE OF BIRTH:  May 25, 1981  SUBJECTIVE:  CHIEF COMPLAINT:   Chief Complaint  Patient presents with  . Foot Pain    No concerns Stood up with PT Complains of pain  REVIEW OF SYSTEMS:    Review of Systems  Constitutional: Negative for chills and fever.  HENT: Negative for sore throat.   Eyes: Negative for blurred vision, double vision and pain.  Respiratory: Negative for cough, hemoptysis, shortness of breath and wheezing.   Cardiovascular: Negative for chest pain, palpitations, orthopnea and leg swelling.  Gastrointestinal: Negative for abdominal pain, constipation, diarrhea, heartburn, nausea and vomiting.  Genitourinary: Negative for dysuria and hematuria.  Musculoskeletal: Positive for joint pain. Negative for back pain.  Skin: Negative for rash.  Neurological: Negative for sensory change, speech change, focal weakness and headaches.  Endo/Heme/Allergies: Does not bruise/bleed easily.  Psychiatric/Behavioral: Negative for depression. The patient is not nervous/anxious.     DRUG ALLERGIES:  No Known Allergies  VITALS:  Blood pressure (!) 142/67, pulse 88, temperature 98.2 F (36.8 C), temperature source Oral, resp. rate 20, height 5' (1.524 m), weight 88.7 kg (195 lb 8.8 oz), SpO2 97 %.  PHYSICAL EXAMINATION:   Physical Exam  GENERAL:  36 y.o.-year-old patient lying in the bed with no acute distress.  EYES: Pupils equal, round, reactive to light and accommodation. No scleral icterus. Extraocular muscles intact.  HEENT: Head atraumatic, normocephalic. Oropharynx and nasopharynx clear.  NECK:  Supple, no jugular venous distention. No thyroid enlargement, no tenderness.  LUNGS: Normal breath sounds bilaterally, no wheezing, rales, rhonchi. No use of accessory muscles of respiration.  CARDIOVASCULAR: S1, S2 normal. No murmurs, rubs, or gallops.  ABDOMEN: Soft,  nontender, nondistended. Bowel sounds present. No organomegaly or mass.  EXTREMITIES: No cyanosis, clubbing or edema b/l.   Tenderness b/l feet. NEUROLOGIC: Cranial nerves II through XII are intact. No focal Motor or sensory deficits b/l.   PSYCHIATRIC: The patient is alert and awake. Flat affect SKIN: No obvious rash, lesion, or ulcer.   LABORATORY PANEL:   CBC  Recent Labs Lab 06/21/17 1553  WBC 6.0  HGB 10.8*  HCT 31.4*  PLT 196   ------------------------------------------------------------------------------------------------------------------ Chemistries   Recent Labs Lab 06/19/17 0729  06/21/17 1420  NA  --   < > 136  K  --   < > 5.4*  CL  --   < > 96*  CO2  --   < > 26  GLUCOSE  --   < > 133*  BUN  --   < > 63*  CREATININE  --   < > 13.77*  CALCIUM  --   < > 9.3  MG 2.3  --   --   < > = values in this interval not displayed. ------------------------------------------------------------------------------------------------------------------  Cardiac Enzymes No results for input(s): TROPONINI in the last 168 hours. ------------------------------------------------------------------------------------------------------------------  RADIOLOGY:  No results found.   ASSESSMENT AND PLAN:   This is a 36 y.o. female with a history of Bipolar disorder with significant cognitive impairment, combative and aggressive behavior, CKD, depression, diabetes, hypertension, hyperlipidemia  now being admitted with:  # Multiple nondisplaced fractures of bilateral feet - Ortho consult appreciated. Boots ordered. Can bear weight. No surgery. Pain meds PRN. - PT is working with her regularly  # End-stage renal disease -Continue PhosLo, Sensipar - Nephro consult for HD. Appreciate help. - MWF schedule  #  History of Bipolar -Continue Depakote, Lexapro, Seroquel, trazodone  # History of GERD - Continue Protonix  # History of hypertension - Continue Norvasc, clonidine,  hydralazine, Avapro, metoprolol.  # History of  Diabetes SSI  All the records are reviewed and case discussed with Care Management/Social Worker Management plans discussed with the patient.  CODE STATUS: FULL CODE  DVT Prophylaxis: SCDs  TOTAL TIME TAKING CARE OF THIS PATIENT: 35 minutes.   POSSIBLE D/C IN 1-2 DAYS, DEPENDING ON CLINICAL CONDITION.  Hillary Bow R M.D on 06/24/2017 at 2:30 PM  Between 7am to 6pm - Pager - [phone]  After 6pm go to [email or web address] - password [email or web address] [facility]  Office  [phone]  CC: Primary care physician; Remi Haggard, FNP  Note: This dictation was prepared with Dragon dictation along with smaller phrase technology. Any transcriptional errors that result from this process are unintentional.

## 2017-06-24 NOTE — Progress Notes (Signed)
  End of hd 

## 2017-06-24 NOTE — Progress Notes (Signed)
Per PT patient is making progress and walked a couple of steps today. Plan is for patient to remain at [facility] until she can ambulate enough to discharge back to [facility] in [place]. CSW will continue to follow and assist as needed.  [employer], LCSW [phone]

## 2017-06-24 NOTE — Progress Notes (Signed)
Physical Therapy Treatment Patient Details Name: Kelly [name] MRN: [id number] DOB: 16-Feb-1981 Today's Date: 06/24/2017    History of Present Illness Pt is a 36 yo F, admitted to acute care on 7/24, with multiple fx in B ft and distal fibula of R ft. Prior to admission, pt required 24 hr assist, and it is unclear of AD with amb, as pt is poor historian. PMH: bipolar disorder with significant cognitive impairment, combative, and aggressive behavior, CKD, depression, diabetes, HTN, HLD.     PT Comments    Pt more willing to participate today, with motivation from nursing audience and candy. Pt cont to be motivated by child-like activities. Pt performs bed mobility with minA and max cues, and transfers and ambulation with MaxA+2 for physical assistance, due to impaired strength, endurance, balance, sequencing, and pain. Pt amb total of 3 steps, limited by impaired strength and balance. Overall, pt responded well to today's treatment with no adverse affects, and is progressing well towards functional goals. Pt would benefit from skilled PT to address the previously mentioned impairments and promote return to PLOF. Currently recommending SNF, pending d/c.     Follow Up Recommendations  SNF     Equipment Recommendations  Rolling walker with 5" wheels    Recommendations for Other Services       Precautions / Restrictions Precautions Precautions: Fall Required Braces or Orthoses: Other Brace/Splint Other Brace/Splint: long cam walker boot on R and short cam walker boot on L. Restrictions Weight Bearing Restrictions: Yes RLE Weight Bearing: Weight bearing as tolerated LLE Weight Bearing: Weight bearing as tolerated    Mobility  Bed Mobility Overal bed mobility: Needs Assistance Bed Mobility: Supine to Sit;Sit to Supine     Supine to sit: Min assist Sit to supine: Min assist   General bed mobility comments: MinA with bed mobility, requiring min verbal, visual, and tactile cues. Pt  followed commands well today.   Transfers Overall transfer level: Needs assistance   Transfers: Sit to/from Stand Sit to Stand: Max assist;+2 physical assistance         General transfer comment: Pt maxA +2 for STS transfer, while wearing B walker boots. Requires max verbal and visual cues for mechanics and safety.   Ambulation/Gait Ambulation/Gait assistance: Max assist;+2 physical assistance Ambulation Distance (Feet): 2 Feet Assistive device: Rolling walker (2 wheeled) Gait Pattern/deviations: Step-through pattern;Decreased step length - right;Decreased step length - left;Decreased stance time - right;Decreased stance time - left     General Gait Details: Pt able to amb 3 steps with max +2. Pt impulsive and experiences LOB posteriorly, with PT's assisting pt in lowering self to sit at EOB. Pt highly motivated by candy and audience.    Stairs            Wheelchair Mobility    Modified Rankin (Stroke Patients Only)       Balance                                            Cognition Arousal/Alertness: Awake/alert Behavior During Therapy: Impulsive Overall Cognitive Status: No family/caregiver present to determine baseline cognitive functioning Area of Impairment: Safety/judgement;Following commands                         Safety/Judgement: Decreased awareness of safety     General Comments: Pt with cognitive impairments at  baseline; with child-like demeanor. Pt with history of being aggressive and combative. Responds best to imaginary scenarios; for example: using "Luther Parody magic," and "abbracadabra," Responds well to motivation of cookies, candy, and coloring in princess book.       Exercises Other Exercises Other Exercises: Supine therex performed with supervison to B LE's: ankle pumps x10 and SLR x 5. Utilized multiple strategies such as play and "re-adjusting boots" to persuade pt into participating. Pt with good mechanics.      General Comments        Pertinent Vitals/Pain Pain Assessment: No/denies pain Faces Pain Scale: Hurts a little bit Pain Location: B feet.  seemed generally comfortable. Pain Descriptors / Indicators: Discomfort Pain Intervention(s): Limited activity within patient's tolerance;Monitored during session    Home Living                      Prior Function            PT Goals (current goals can now be found in the care plan section) Acute Rehab PT Goals Patient Stated Goal: to go home PT Goal Formulation: With patient Time For Goal Achievement: 07/04/17 Potential to Achieve Goals: Fair Progress towards PT goals: Progressing toward goals    Frequency    Min 2X/week      PT Plan Current plan remains appropriate    Co-evaluation              AM-PAC PT "6 Clicks" Daily Activity  Outcome Measure  Difficulty turning over in bed (including adjusting bedclothes, sheets and blankets)?: Total Difficulty moving from lying on back to sitting on the side of the bed? : Total Difficulty sitting down on and standing up from a chair with arms (e.g., wheelchair, bedside commode, etc,.)?: Total Help needed moving to and from a bed to chair (including a wheelchair)?: A Lot Help needed walking in hospital room?: A Lot Help needed climbing 3-5 steps with a railing? : Total 6 Click Score: 8    End of Session Equipment Utilized During Treatment: Gait belt Activity Tolerance: Patient tolerated treatment well;Patient limited by pain Patient left: in bed;with bed alarm set;with call bell/phone within reach;with nursing/sitter in room Nurse Communication: Mobility status PT Visit Diagnosis: Unsteadiness on feet (R26.81);Other abnormalities of gait and mobility (R26.89);Muscle weakness (generalized) (M62.81);Pain Pain - Right/Left:  (Bilateral) Pain - part of body: Ankle and joints of foot     Time: 8299-3716 PT Time Calculation (min) (ACUTE ONLY): 25 min  Charges:                        G Codes:       Oran Rein PT, SPT   Bevelyn Ngo 06/24/2017, 12:35 PM

## 2017-06-24 NOTE — Progress Notes (Signed)
Pre hd assessment  

## 2017-06-24 NOTE — Progress Notes (Signed)
Post hd vitals 

## 2017-06-24 NOTE — Progress Notes (Signed)
Post hd assessment 

## 2017-06-24 NOTE — Progress Notes (Signed)
Pre hd info 

## 2017-06-24 NOTE — Progress Notes (Signed)
Called Dr. Darvin Neighbours. Regarding pre dialysis 6.8 potassium. New orders for recheck potassium and cardiac monitoring.

## 2017-06-24 NOTE — Progress Notes (Addendum)
Patient requested pain medication. When brought to room, patient refused to take it. Medication returned to pyxis.    Wynema Birch, RN

## 2017-06-25 LAB — RENAL FUNCTION PANEL
Albumin: 3.1 g/dL — ABNORMAL LOW (ref 3.5–5.0)
Anion gap: 12 (ref 5–15)
BUN: 54 mg/dL — AB (ref 6–20)
CHLORIDE: 97 mmol/L — AB (ref 101–111)
CO2: 31 mmol/L (ref 22–32)
CREATININE: 11.57 mg/dL — AB (ref 0.44–1.00)
Calcium: 10 mg/dL (ref 8.9–10.3)
GFR calc Af Amer: 4 mL/min — ABNORMAL LOW (ref >60)
GFR, EST NON AFRICAN AMERICAN: 4 mL/min — AB (ref >60)
Glucose, Bld: 158 mg/dL — ABNORMAL HIGH (ref 65–99)
Phosphorus: 3.3 mg/dL (ref 2.5–4.6)
Potassium: 6.1 mmol/L — ABNORMAL HIGH (ref 3.5–5.1)
SODIUM: 140 mmol/L (ref 135–145)

## 2017-06-25 LAB — CBC
HEMATOCRIT: 28.9 % — AB (ref 35.0–47.0)
HEMOGLOBIN: 9.7 g/dL — AB (ref 12.0–16.0)
MCH: 34.1 pg — ABNORMAL HIGH (ref 26.0–34.0)
MCHC: 33.6 g/dL (ref 32.0–36.0)
MCV: 101.4 fL — ABNORMAL HIGH (ref 80.0–100.0)
Platelets: 198 10*3/uL (ref 150–440)
RBC: 2.85 MIL/uL — AB (ref 3.80–5.20)
RDW: 15.4 % — ABNORMAL HIGH (ref 11.5–14.5)
WBC: 5.1 10*3/uL (ref 3.6–11.0)

## 2017-06-25 LAB — GLUCOSE, CAPILLARY
GLUCOSE-CAPILLARY: 154 mg/dL — AB (ref 65–99)
GLUCOSE-CAPILLARY: 177 mg/dL — AB (ref 65–99)
Glucose-Capillary: 218 mg/dL — ABNORMAL HIGH (ref 65–99)

## 2017-06-25 MED ORDER — SODIUM POLYSTYRENE SULFONATE 15 GM/60ML PO SUSP
30.0000 g | Freq: Once | ORAL | Status: AC
  Administered 2017-06-25: 30 g via ORAL
  Filled 2017-06-25: qty 120

## 2017-06-25 NOTE — Progress Notes (Signed)
Post hd vitals 

## 2017-06-25 NOTE — Progress Notes (Signed)
Pre hd assessment  

## 2017-06-25 NOTE — Progress Notes (Signed)
Pre hd info 

## 2017-06-25 NOTE — Progress Notes (Signed)
[facility] at [place] NAME: Kelly [name]    MR#:  [id number]  DATE OF BIRTH:  February 07, 1981  SUBJECTIVE:  CHIEF COMPLAINT:   Chief Complaint  Patient presents with  . Foot Pain   Incomplete hemodialysis as patient refused yesterday. Potassium was high. Refused Kayexalate earlier but did take it later.  Continues to have pain in bilateral feet.  REVIEW OF SYSTEMS:    Review of Systems  Constitutional: Negative for chills and fever.  HENT: Negative for sore throat.   Eyes: Negative for blurred vision, double vision and pain.  Respiratory: Negative for cough, hemoptysis, shortness of breath and wheezing.   Cardiovascular: Negative for chest pain, palpitations, orthopnea and leg swelling.  Gastrointestinal: Negative for abdominal pain, constipation, diarrhea, heartburn, nausea and vomiting.  Genitourinary: Negative for dysuria and hematuria.  Musculoskeletal: Positive for joint pain. Negative for back pain.  Skin: Negative for rash.  Neurological: Negative for sensory change, speech change, focal weakness and headaches.  Endo/Heme/Allergies: Does not bruise/bleed easily.  Psychiatric/Behavioral: Negative for depression. The patient is not nervous/anxious.     DRUG ALLERGIES:  No Known Allergies  VITALS:  Blood pressure 133/62, pulse (!) 102, temperature 98.6 F (37 C), temperature source Oral, resp. rate 19, height 5' (1.524 m), weight 89.1 kg (196 lb 6.9 oz), SpO2 94 %.  PHYSICAL EXAMINATION:   Physical Exam  GENERAL:  36 y.o.-year-old patient lying in the bed with no acute distress.  EYES: Pupils equal, round, reactive to light and accommodation. No scleral icterus. Extraocular muscles intact.  HEENT: Head atraumatic, normocephalic. Oropharynx and nasopharynx clear.  NECK:  Supple, no jugular venous distention. No thyroid enlargement, no tenderness.  LUNGS: Normal breath sounds bilaterally, no wheezing, rales, rhonchi. No use of  accessory muscles of respiration.  CARDIOVASCULAR: S1, S2 normal. No murmurs, rubs, or gallops.  ABDOMEN: Soft, nontender, nondistended. Bowel sounds present. No organomegaly or mass.  EXTREMITIES: No cyanosis, clubbing or edema b/l.   Tenderness b/l feet. NEUROLOGIC: Cranial nerves II through XII are intact. No focal Motor or sensory deficits b/l.   PSYCHIATRIC: The patient is alert and awake. Flat affect SKIN: No obvious rash, lesion, or ulcer.   LABORATORY PANEL:   CBC  Recent Labs Lab 06/24/17 1130  WBC 4.0  HGB 9.7*  HCT 29.3*  PLT 205   ------------------------------------------------------------------------------------------------------------------ Chemistries   Recent Labs Lab 06/19/17 0729  06/24/17 1130 06/24/17 1741  NA  --   < > 133*  --   K  --   < > 6.8* 5.6*  CL  --   < > 93*  --   CO2  --   < > 26  --   GLUCOSE  --   < > 202*  --   BUN  --   < > 83*  --   CREATININE  --   < > 15.01*  --   CALCIUM  --   < > 9.5  --   MG 2.3  --   --   --   < > = values in this interval not displayed. ------------------------------------------------------------------------------------------------------------------  Cardiac Enzymes No results for input(s): TROPONINI in the last 168 hours. ------------------------------------------------------------------------------------------------------------------  RADIOLOGY:  No results found.   ASSESSMENT AND PLAN:   This is a 36 y.o. female with a history of Bipolar disorder with significant cognitive impairment, combative and aggressive behavior, CKD, depression, diabetes, hypertension, hyperlipidemia  now being admitted with:  # Multiple nondisplaced fractures of  bilateral feet - Ortho consult appreciated. Boots ordered. Can bear weight. No surgery. Pain meds PRN. - PT is working with her regularly  # End-stage renal disease -Continue PhosLo, Sensipar - Nephro consult for HD. Appreciate help. - MWF schedule Dialysis  scheduled again today due to elevated potassium  # History of Bipolar -Continue Depakote, Lexapro, Seroquel, trazodone  # History of GERD - Continue Protonix  # History of hypertension - Continue Norvasc, clonidine, hydralazine, Avapro, metoprolol.  # History of  Diabetes SSI  All the records are reviewed and case discussed with Care Management/Social Worker Management plans discussed with the patient.  CODE STATUS: FULL CODE  DVT Prophylaxis: SCDs  TOTAL TIME TAKING CARE OF THIS PATIENT: 25 minutes.   POSSIBLE D/C IN 1-2 DAYS, DEPENDING ON CLINICAL CONDITION.  Hillary Bow R M.D on 06/25/2017 at 10:44 AM  Between 7am to 6pm - Pager - [phone]  After 6pm go to [email or web address] - password [email or web address] [facility]  Office  [phone]  CC: Primary care physician; Remi Haggard, FNP  Note: This dictation was prepared with Dragon dictation along with smaller phrase technology. Any transcriptional errors that result from this process are unintentional.

## 2017-06-25 NOTE — Progress Notes (Signed)
Post hd assessment 

## 2017-06-25 NOTE — Progress Notes (Signed)
Pt ended HD 2 hours early AMA. No reason given. See other progress notes. Report to primary RN. Candiss Norse MD aware.

## 2017-06-25 NOTE — Progress Notes (Signed)
Attempted to educate pt about HD, renal diet, hyperkalemia and reason for dialysis today. Pt stated "I dont give a shit about my heart. Let me sign off tx." Candiss Norse MD present and aware.

## 2017-06-25 NOTE — Progress Notes (Signed)
Hd start, pt alert, no c/o, stable, 1k bath for hyperkalemia

## 2017-06-25 NOTE — Progress Notes (Signed)
Patient refused CBG to be performed. Also refused scheduled/ night time medications. Educated patient about the importance of her medications and that her doctor wanted her to have them. She continued to refuse.

## 2017-06-25 NOTE — Progress Notes (Signed)
[facility]  ROUNDING NOTE   Subjective:   Extra HD today. Patient cut her dialysis short yesterday Hyperkalemia post HD   HEMODIALYSIS FLOWSHEET:  Blood Flow Rate (mL/min): 400 mL/min Arterial Pressure (mmHg): -220 mmHg Venous Pressure (mmHg): 200 mmHg Transmembrane Pressure (mmHg): 50 mmHg Ultrafiltration Rate (mL/min): 670 mL/min Dialysate Flow Rate (mL/min): 800 ml/min Conductivity: Machine : 15.4 Conductivity: Machine : 15.4 Dialysis Fluid Bolus: Normal Saline Bolus Amount (mL): 250 mL (prime) Dialysate Change: 1K    Objective:  Vital signs in last 24 hours:  Temp:  [98.2 F (36.8 C)-99 F (37.2 C)] 99 F (37.2 C) (07/31 1050) Pulse Rate:  [86-116] 109 (07/31 1130) Resp:  [13-23] 17 (07/31 1130) BP: (103-155)/(60-97) 123/74 (07/31 1130) SpO2:  [94 %-100 %] 97 % (07/31 1130) Weight:  [86.6 kg (190 lb 14.7 oz)-89.1 kg (196 lb 6.9 oz)] 86.6 kg (190 lb 14.7 oz) (07/31 1050)  Weight change:  Filed Weights   06/21/17 1630 06/24/17 1420 06/25/17 1050  Weight: 88.7 kg (195 lb 8.8 oz) 89.1 kg (196 lb 6.9 oz) 86.6 kg (190 lb 14.7 oz)    Intake/Output: I/O last 3 completed shifts: In: 120 [P.O.:120] Out: 1100 [Other:1100]   Intake/Output this shift:  Total I/O In: 120 [P.O.:120] Out: -   Physical Exam: General: NAD,   Head: Normocephalic, atraumatic. Moist oral mucosal membranes  Eyes: Anicteric,   Neck: Supple,   Lungs:  Clear to auscultation  Heart: Regular rate and rhythm  Abdomen:  Soft, nontender  Extremities: trace peripheral edema, foot braces -   Neurologic: Nonfocal, moving all four extremities  Skin: No lesions  Access: Left AVF    Basic Metabolic Panel:  Recent Labs Lab 06/19/17 0729 06/19/17 1030 06/21/17 1420 06/24/17 1130 06/24/17 1741  NA  --  137 136 133*  --   K  --  4.5 5.4* 6.8* 5.6*  CL  --  95* 96* 93*  --   CO2  --  27 26 26   --   GLUCOSE  --  144* 133* 202*  --   BUN  --  58* 63* 83*  --   CREATININE  --   12.07* 13.77* 15.01*  --   CALCIUM  --  9.3 9.3 9.5  --   MG 2.3  --   --   --   --   PHOS 4.3 4.0 3.0 3.1  --     Liver Function Tests:  Recent Labs Lab 06/19/17 1030 06/21/17 1420 06/24/17 1130  ALBUMIN 3.5 3.0* 3.3*   No results for input(s): LIPASE, AMYLASE in the last 168 hours. No results for input(s): AMMONIA in the last 168 hours.  CBC:  Recent Labs Lab 06/19/17 1030 06/21/17 1553 06/24/17 1130 06/25/17 0500  WBC 6.6 6.0 4.0 5.1  HGB 10.8* 10.8* 9.7* 9.7*  HCT 31.9* 31.4* 29.3* 28.9*  MCV 101.1* 101.8* 101.9* 101.4*  PLT 192 196 205 198    Cardiac Enzymes: No results for input(s): CKTOTAL, CKMB, CKMBINDEX, TROPONINI in the last 168 hours.  BNP: Invalid input(s): POCBNP  CBG:  Recent Labs Lab 06/24/17 0750 06/24/17 1140 06/24/17 1745 06/24/17 2119 06/25/17 0811  GLUCAP 174* 222* 135* 205* 154*    Microbiology: Results for orders placed or performed during the hospital encounter of 06/18/17  MRSA PCR Screening     Status: None   Collection Time: 06/19/17 10:16 PM  Result Value Ref Range Status   MRSA by PCR NEGATIVE NEGATIVE Final    Comment:  The GeneXpert MRSA Assay (FDA approved for NASAL specimens only), is one component of a comprehensive MRSA colonization surveillance program. It is not intended to diagnose MRSA infection nor to guide or monitor treatment for MRSA infections.     Coagulation Studies: No results for input(s): LABPROT, INR in the last 72 hours.  Urinalysis: No results for input(s): COLORURINE, LABSPEC, PHURINE, GLUCOSEU, HGBUR, BILIRUBINUR, KETONESUR, PROTEINUR, UROBILINOGEN, NITRITE, LEUKOCYTESUR in the last 72 hours.  Invalid input(s): APPERANCEUR    Imaging: No results found.   Medications:    . amLODipine  10 mg Oral QPM  . aspirin  81 mg Oral Daily  . calcium acetate  1,334 mg Oral BID WC  . cinacalcet  60 mg Oral Daily  . cloNIDine  0.1 mg Oral BID  . divalproex  1,000 mg Oral QHS  .  docusate sodium  100 mg Oral QHS  . epoetin (EPOGEN/PROCRIT) injection  4,000 Units Intravenous Q M,W,F-HD  . escitalopram  15 mg Oral Daily  . folic acid  2 mg Oral QHS  . heparin  5,000 Units Subcutaneous Q8H  . hydrALAZINE  25 mg Oral TID  . insulin aspart  0-15 Units Subcutaneous TID WC  . insulin aspart  0-5 Units Subcutaneous QHS  . irbesartan  300 mg Oral QHS  . metoprolol tartrate  50 mg Oral BID  . multivitamin  1 tablet Oral Daily  . multivitamin with minerals  1 tablet Oral Daily  . pantoprazole  40 mg Oral Daily  . patiromer  8.4 g Oral Q24H  . QUEtiapine  200 mg Oral BID  . traZODone  100 mg Oral QHS  . vitamin B-12  1,000 mcg Oral QHS   acetaminophen **OR** acetaminophen, albuterol, bisacodyl, ipratropium, magnesium citrate, morphine injection, ondansetron **OR** ondansetron (ZOFRAN) IV, oxyCODONE, senna-docusate  Assessment/ Plan:  [name] is a 36 y.o. black female  with developmental delay, ESRD on hemodialysis, anemia, hypertension, schizophrenia, diabetes mellitus type II  Admitted 06/18/2017 for bilateral metatarsal fractures and right fibular fracture.  No surgical intervention warranted. Dr. Roland Rack evaluated patient on 7/25.   [place]  1. End Stage Renal Disease: on hemodialysis MWF schedule.   - Patient seen during dialysis Tolerating well   2. Hypertension: blood pressure at goal when patient takes her medications.  - Continue current regimen  3. Anemia of chronic kidney disease: hemoglobin 9.7 - Continue Epogen with dialysis.    4. Secondary Hyperparathyroidism:   cinacalcet, calcium acetate  5. Hyperkalemia - follow strict low potassium diet - avoid potato chips    LOS: 7 Kelly [name] 7/31/201811:36 AM

## 2017-06-25 NOTE — Progress Notes (Signed)
PT Cancellation Note  Patient Details Name: Kelly [name] MRN: [id number] DOB: 1981-06-17   Cancelled Treatment:    Reason Eval/Treat Not Completed: Other (comment)  Pt currently with potassium of 6.1; will re-attempt, when medically appropriate. Will hold therapy consult at this time.    Bevelyn Ngo 06/25/2017, 12:57 PM

## 2017-06-25 NOTE — Progress Notes (Signed)
  End of hd 

## 2017-06-25 NOTE — Progress Notes (Signed)
Pt agreed to remain on HD for 10 mins to complete an hour of tx. Candiss Norse MD present and aware. Pt states "I dont care let me die."

## 2017-06-26 LAB — GLUCOSE, CAPILLARY
GLUCOSE-CAPILLARY: 114 mg/dL — AB (ref 65–99)
GLUCOSE-CAPILLARY: 188 mg/dL — AB (ref 65–99)
GLUCOSE-CAPILLARY: 75 mg/dL (ref 65–99)
Glucose-Capillary: 280 mg/dL — ABNORMAL HIGH (ref 65–99)
Glucose-Capillary: 302 mg/dL — ABNORMAL HIGH (ref 65–99)

## 2017-06-26 LAB — POTASSIUM: POTASSIUM: 5.1 mmol/L (ref 3.5–5.1)

## 2017-06-26 MED ORDER — CLONIDINE HCL 0.1 MG PO TABS
0.1000 mg | ORAL_TABLET | Freq: Three times a day (TID) | ORAL | Status: DC
  Administered 2017-06-26 – 2017-06-30 (×9): 0.1 mg via ORAL
  Filled 2017-06-26 (×11): qty 1

## 2017-06-26 NOTE — Progress Notes (Signed)
[place] [facility] at [place] NAME: Kelly [name]    MR#:  [id number]  DATE OF BIRTH:  01-Mar-1981  SUBJECTIVE:  CHIEF COMPLAINT:   Chief Complaint  Patient presents with  . Foot Pain   Left from hemodialysis 2 hours early yesterday AGAINST MEDICAL ADVICE. Still has pain in her feet.  Due for hemodialysis again today  REVIEW OF SYSTEMS:    Review of Systems  Constitutional: Negative for chills and fever.  HENT: Negative for sore throat.   Eyes: Negative for blurred vision, double vision and pain.  Respiratory: Negative for cough, hemoptysis, shortness of breath and wheezing.   Cardiovascular: Negative for chest pain, palpitations, orthopnea and leg swelling.  Gastrointestinal: Negative for abdominal pain, constipation, diarrhea, heartburn, nausea and vomiting.  Genitourinary: Negative for dysuria and hematuria.  Musculoskeletal: Positive for joint pain. Negative for back pain.  Skin: Negative for rash.  Neurological: Negative for sensory change, speech change, focal weakness and headaches.  Endo/Heme/Allergies: Does not bruise/bleed easily.  Psychiatric/Behavioral: Negative for depression. The patient is not nervous/anxious.     DRUG ALLERGIES:  No Known Allergies  VITALS:  Blood pressure 139/74, pulse 91, temperature 98.1 F (36.7 C), temperature source Oral, resp. rate 18, height 5' (1.524 m), weight 86.6 kg (190 lb 14.7 oz), SpO2 100 %.  PHYSICAL EXAMINATION:   Physical Exam  GENERAL:  36 y.o.-year-old patient lying in the bed with no acute distress.  EYES: Pupils equal, round, reactive to light and accommodation. No scleral icterus. Extraocular muscles intact.  HEENT: Head atraumatic, normocephalic. Oropharynx and nasopharynx clear.  NECK:  Supple, no jugular venous distention. No thyroid enlargement, no tenderness.  LUNGS: Normal breath sounds bilaterally, no wheezing, rales, rhonchi. No use of accessory muscles of respiration.   CARDIOVASCULAR: S1, S2 normal. No murmurs, rubs, or gallops.  ABDOMEN: Soft, nontender, nondistended. Bowel sounds present. No organomegaly or mass.  EXTREMITIES: No cyanosis, clubbing or edema b/l.   Tenderness b/l feet. NEUROLOGIC: Cranial nerves II through XII are intact. No focal Motor or sensory deficits b/l.   PSYCHIATRIC: The patient is alert and awake. Flat affect SKIN: No obvious rash, lesion, or ulcer.   LABORATORY PANEL:   CBC  Recent Labs Lab 06/25/17 0500  WBC 5.1  HGB 9.7*  HCT 28.9*  PLT 198   ------------------------------------------------------------------------------------------------------------------ Chemistries   Recent Labs Lab 06/25/17 1108  NA 140  K 6.1*  CL 97*  CO2 31  GLUCOSE 158*  BUN 54*  CREATININE 11.57*  CALCIUM 10.0   ------------------------------------------------------------------------------------------------------------------  Cardiac Enzymes No results for input(s): TROPONINI in the last 168 hours. ------------------------------------------------------------------------------------------------------------------  RADIOLOGY:  No results found.   ASSESSMENT AND PLAN:   This is a 36 y.o. female with a history of Bipolar disorder with significant cognitive impairment, combative and aggressive behavior, CKD, depression, diabetes, hypertension, hyperlipidemia  now being admitted with:  # Hyperkalemia. Due to inadequate hemodialysis. Patient has refused hemodialysis on multiple occasions. Schedule for hemodialysis again today. Received 1 dose of Kayexalate yesterday. Repeat labs during hemodialysis.  # Multiple nondisplaced fractures of bilateral feet - Ortho consult appreciated. Boots ordered. Can bear weight. No surgery. Pain meds PRN. - PT is working with her regularly  # End-stage renal disease -Continue PhosLo, Sensipar - Nephro consult for HD. Appreciate help. - MWF schedule Dialysis scheduled again today due to  elevated potassium  # History of Bipolar -Continue Depakote, Lexapro, Seroquel, trazodone  # History of GERD - Continue Protonix  #  History of hypertension - Continue Norvasc, clonidine, hydralazine, Avapro, metoprolol.  # History of  Diabetes SSI  All the records are reviewed and case discussed with Care Management/Social Worker Management plans discussed with the patient.  CODE STATUS: FULL CODE  DVT Prophylaxis: SCDs  TOTAL TIME TAKING CARE OF THIS PATIENT: 25 minutes.    Hillary Bow R M.D on 06/26/2017 at 12:10 PM  Between 7am to 6pm - Pager - [phone]  After 6pm go to [email or web address] - password [email or web address] [facility]  Office  [phone]  CC: Primary care physician; Remi Haggard, FNP  Note: This dictation was prepared with Dragon dictation along with smaller phrase technology. Any transcriptional errors that result from this process are unintentional.

## 2017-06-26 NOTE — Progress Notes (Signed)
[facility]  ROUNDING NOTE   Subjective:   Patient is refusing lab draw today Patient was noted to have hyperkalemia yesterday but receive dialysis after that. When she returned to her room, she received Kayexalate Today she is resting quietly.  No shortness of breath   Objective:  Vital signs in last 24 hours:  Temp:  [98.1 F (36.7 C)-98.8 F (37.1 C)] 98.2 F (36.8 C) (08/01 1449) Pulse Rate:  [85-104] 104 (08/01 1449) Resp:  [16-19] 16 (08/01 1449) BP: (116-144)/(47-74) 138/72 (08/01 1449) SpO2:  [93 %-100 %] 93 % (08/01 1449)  Weight change: -2.5 kg (-5 lb 8.2 oz) Filed Weights   06/21/17 1630 06/24/17 1420 06/25/17 1050  Weight: 88.7 kg (195 lb 8.8 oz) 89.1 kg (196 lb 6.9 oz) 86.6 kg (190 lb 14.7 oz)    Intake/Output: I/O last 3 completed shifts: In: 240 [P.O.:240] Out: 175 [Other:175]   Intake/Output this shift:  Total I/O In: 120 [P.O.:120] Out: -   Physical Exam: General: NAD,   Head: Normocephalic, atraumatic. Moist oral mucosal membranes  Eyes: Anicteric,   Neck: Supple,   Lungs:  Clear to auscultation  Heart: Regular rate and rhythm  Abdomen:  Soft, nontender  Extremities: trace peripheral edema, foot braces -   Neurologic: Resting quietly  Skin: No lesions  Access: Left AVF    Basic Metabolic Panel:  Recent Labs Lab 06/21/17 1420 06/24/17 1130 06/24/17 1741 06/25/17 1108  NA 136 133*  --  140  K 5.4* 6.8* 5.6* 6.1*  CL 96* 93*  --  97*  CO2 26 26  --  31  GLUCOSE 133* 202*  --  158*  BUN 63* 83*  --  54*  CREATININE 13.77* 15.01*  --  11.57*  CALCIUM 9.3 9.5  --  10.0  PHOS 3.0 3.1  --  3.3    Liver Function Tests:  Recent Labs Lab 06/21/17 1420 06/24/17 1130 06/25/17 1108  ALBUMIN 3.0* 3.3* 3.1*   No results for input(s): LIPASE, AMYLASE in the last 168 hours. No results for input(s): AMMONIA in the last 168 hours.  CBC:  Recent Labs Lab 06/21/17 1553 06/24/17 1130 06/25/17 0500  WBC 6.0 4.0 5.1   HGB 10.8* 9.7* 9.7*  HCT 31.4* 29.3* 28.9*  MCV 101.8* 101.9* 101.4*  PLT 196 205 198    Cardiac Enzymes: No results for input(s): CKTOTAL, CKMB, CKMBINDEX, TROPONINI in the last 168 hours.  BNP: Invalid input(s): POCBNP  CBG:  Recent Labs Lab 06/25/17 1259 06/25/17 1624 06/25/17 2116 06/26/17 0752 06/26/17 1153  GLUCAP 177* 218* 114* 188* 280*    Microbiology: Results for orders placed or performed during the hospital encounter of 06/18/17  MRSA PCR Screening     Status: None   Collection Time: 06/19/17 10:16 PM  Result Value Ref Range Status   MRSA by PCR NEGATIVE NEGATIVE Final    Comment:        The GeneXpert MRSA Assay (FDA approved for NASAL specimens only), is one component of a comprehensive MRSA colonization surveillance program. It is not intended to diagnose MRSA infection nor to guide or monitor treatment for MRSA infections.     Coagulation Studies: No results for input(s): LABPROT, INR in the last 72 hours.  Urinalysis: No results for input(s): COLORURINE, LABSPEC, PHURINE, GLUCOSEU, HGBUR, BILIRUBINUR, KETONESUR, PROTEINUR, UROBILINOGEN, NITRITE, LEUKOCYTESUR in the last 72 hours.  Invalid input(s): APPERANCEUR    Imaging: No results found.   Medications:    . amLODipine  10  mg Oral QPM  . aspirin  81 mg Oral Daily  . calcium acetate  1,334 mg Oral BID WC  . cinacalcet  60 mg Oral Daily  . cloNIDine  0.1 mg Oral TID  . divalproex  1,000 mg Oral QHS  . docusate sodium  100 mg Oral QHS  . epoetin (EPOGEN/PROCRIT) injection  4,000 Units Intravenous Q M,W,F-HD  . escitalopram  15 mg Oral Daily  . folic acid  2 mg Oral QHS  . heparin  5,000 Units Subcutaneous Q8H  . hydrALAZINE  25 mg Oral TID  . insulin aspart  0-15 Units Subcutaneous TID WC  . insulin aspart  0-5 Units Subcutaneous QHS  . metoprolol tartrate  50 mg Oral BID  . multivitamin  1 tablet Oral Daily  . multivitamin with minerals  1 tablet Oral Daily  . pantoprazole   40 mg Oral Daily  . patiromer  8.4 g Oral Q24H  . QUEtiapine  200 mg Oral BID  . traZODone  100 mg Oral QHS  . vitamin B-12  1,000 mcg Oral QHS   acetaminophen **OR** acetaminophen, albuterol, bisacodyl, ipratropium, magnesium citrate, morphine injection, ondansetron **OR** ondansetron (ZOFRAN) IV, oxyCODONE, senna-docusate  Assessment/ Plan:  Kelly [name] is a 36 y.o. black female  with developmental delay, ESRD on hemodialysis, anemia, hypertension, schizophrenia, diabetes mellitus type II  Admitted 06/18/2017 for bilateral metatarsal fractures and right fibular fracture.  No surgical intervention warranted. Dr. Roland Rack evaluated patient on 7/25.   Justice  1. End Stage Renal Disease: on hemodialysis MWF schedule.   - plan for dialysis tomorrow  2. Hypertension: blood pressure at goal when patient takes her medications.  - Continue current regimen  3. Anemia of chronic kidney disease: hemoglobin 9.7 - Continue Epogen with dialysis.    4. Secondary Hyperparathyroidism:   cinacalcet, calcium acetate  5. Hyperkalemia - follow strict low potassium diet - avoid potato chips  - treatment dialysis potassium 6.1 yesterday Patient also received Kayexalate after dialysis treatment Will repeat evaluation tomorrow prior to dialysis    LOS: 8 Kelly [name] 8/1/20183:25 PM

## 2017-06-26 NOTE — Progress Notes (Signed)
Plan is for patient to remain at [facility] until she can ambulate enough to discharge back to [facility] in [place]. CSW contacted Mr. [place] group home owner and made him aware of above. Per Mr. [place] has approved authorization for patent's group home stay. CSW also contacted patient's guardian Kelly [name] and made her aware of above. CSW will continue to follow and assist as needed.  [employer], LCSW [phone]

## 2017-06-26 NOTE — Progress Notes (Signed)
Initial Nutrition Assessment  DOCUMENTATION CODES:   Obesity unspecified  INTERVENTION:  1. Continue Rena-Vit daily 2. Monitor for needs, patient currently refusing medications and labs  NUTRITION DIAGNOSIS:   Increased nutrient needs related to chronic illness as evidenced by estimated needs.  GOAL:   Patient will meet greater than or equal to 90% of their needs  MONITOR:   I & O's, Labs, PO intake, Weight trends  REASON FOR ASSESSMENT:   Malnutrition Screening Tool    ASSESSMENT:   Kelly [name] has a PMHHx Bipolar disorder with cognitive impairment, combative and aggressive behavior, ESRD, depression, DM2, HTN, HLD, ETOH abuse, presents with bilateral foot pain. Turns out she has bilateral foot fractures. No surgery per ortho.  Ended HD AMA yesterday. Refusing HD and PT sometimes. Refused scheduled/night time mediations last night. Refused meds this morning. She did not want to talk to me, but she did answer and say she has a usual weight of 187 lbs. Nodded her head saying she eats well. Shook her head in response to if she has had weight loss. Meal Completion: 40-50% so far. Would not give me details on PO intake PTA or today. Denied any issues chewing/swallowing/choking Unable to complete Nutrition-Focused physical exam at this time.  Patient did not exhibit any wasting or depletions upon observation.    Intake/Output Summary (Last 24 hours) at 06/26/17 1228 Last data filed at 06/26/17 1040  Gross per 24 hour  Intake              240 ml  Output                0 ml  Net              240 ml  No UOP yesterday. Medications reviewed and include:  Phoslo 1,334mg  BID, Colace, EPO with HD, G99 Folic Acid 2mg , Novolog 0-15 Units TID, 0-5 Units Labs reviewed:  CBGs: 188, 114, K+ 6.1  Diet Order:  Diet renal with fluid restriction Fluid restriction: 1200 mL Fluid; Room service appropriate? Yes; Fluid consistency: Thin  Skin:  Reviewed, no issues  Last BM:   06/25/2017  Height:   Ht Readings from Last 1 Encounters:  06/18/17 5' (1.524 m)    Weight:   Wt Readings from Last 1 Encounters:  06/25/17 190 lb 14.7 oz (86.6 kg)  Utilized ABW of 78.75 for calcualtions  Ideal Body Weight:  45.45 kg  BMI:  Body mass index is 37.29 kg/m.  Estimated Nutritional Needs:   Kcal:  1950-2350 calories (25-30 cal/kg ABW)  Protein:  95-118 grams (1.2-1.5g/kg ABW)  Fluid:  UOP + 1L  EDUCATION NEEDS:   Education needs no appropriate at this time  Kelly [name]. Kelly Mielke, MS, RD LDN Inpatient Clinical Dietitian Pager [phone]

## 2017-06-26 NOTE — Progress Notes (Signed)
Received call stating that patient was refusing to have labs drawn. I spoke with patient but was unable to convince her to have labs drawn. Primary nurse, Lonna Duval, is aware. We will call lab back if patient changes her mind.

## 2017-06-27 LAB — RENAL FUNCTION PANEL
Albumin: 3.1 g/dL — ABNORMAL LOW (ref 3.5–5.0)
Anion gap: 13 (ref 5–15)
BUN: 49 mg/dL — AB (ref 6–20)
CHLORIDE: 94 mmol/L — AB (ref 101–111)
CO2: 31 mmol/L (ref 22–32)
CREATININE: 11.91 mg/dL — AB (ref 0.44–1.00)
Calcium: 9.5 mg/dL (ref 8.9–10.3)
GFR calc non Af Amer: 4 mL/min — ABNORMAL LOW (ref >60)
GFR, EST AFRICAN AMERICAN: 4 mL/min — AB (ref >60)
GLUCOSE: 256 mg/dL — AB (ref 65–99)
Phosphorus: 3.4 mg/dL (ref 2.5–4.6)
Potassium: 5.5 mmol/L — ABNORMAL HIGH (ref 3.5–5.1)
SODIUM: 138 mmol/L (ref 135–145)

## 2017-06-27 LAB — GLUCOSE, CAPILLARY
GLUCOSE-CAPILLARY: 264 mg/dL — AB (ref 65–99)
Glucose-Capillary: 227 mg/dL — ABNORMAL HIGH (ref 65–99)
Glucose-Capillary: 188 mg/dL — ABNORMAL HIGH (ref 65–99)
Glucose-Capillary: 150 mg/dL — ABNORMAL HIGH (ref 65–99)

## 2017-06-27 MED ORDER — INSULIN ASPART 100 UNIT/ML ~~LOC~~ SOLN
2.0000 [IU] | Freq: Three times a day (TID) | SUBCUTANEOUS | Status: DC
  Administered 2017-06-27 – 2017-07-01 (×7): 2 [IU] via SUBCUTANEOUS
  Filled 2017-06-27 (×7): qty 1

## 2017-06-27 NOTE — Progress Notes (Signed)
Inpatient Diabetes Program Recommendations  AACE/ADA: New Consensus Statement on Inpatient Glycemic Control (2015)  Target Ranges:  Prepandial:   less than 140 mg/dL      Peak postprandial:   less than 180 mg/dL (1-2 hours)      Critically ill patients:  140 - 180 mg/dL   Lab Results  Component Value Date   GLUCAP 227 (H) 06/27/2017   HGBA1C 5.7 10/07/2012    Review of Glycemic Control:  Results for Kelly [name], Kelly [name] (MRN [id number]) as of 06/27/2017 09:48  Ref. Range 06/26/2017 07:52 06/26/2017 11:53 06/26/2017 16:43 06/26/2017 21:07 06/27/2017 08:33  Glucose-Capillary Latest Ref Range: 65 - 99 mg/dL 188 (H) 280 (H) 75 302 (H) 227 (H)   Diabetes history: Diabetes Outpatient Diabetes medications: Novolog Current orders for Inpatient glycemic control:  Novolog moderate tid with meals and HS  Inpatient Diabetes Program Recommendations:   Please reduce Novolog correction to sensitive tid with meals and HS.  Also consider adding Novolog meal coverage 2 units tid with meals (hold if patient eats less than 50%).  Thanks, Adah Perl, RN, BC-ADM Inpatient Diabetes Coordinator Pager [phone] (8a-5p)

## 2017-06-27 NOTE — Progress Notes (Signed)
[facility]  ROUNDING NOTE   Subjective:   Patient seen during dialysis Tolerating well    HEMODIALYSIS FLOWSHEET:  Blood Flow Rate (mL/min): 400 mL/min Arterial Pressure (mmHg): -140 mmHg Venous Pressure (mmHg): 190 mmHg Transmembrane Pressure (mmHg): 70 mmHg Ultrafiltration Rate (mL/min): 670 mL/min Dialysate Flow Rate (mL/min): 600 ml/min Conductivity: Machine : 13.7 Conductivity: Machine : 13.7 Dialysis Fluid Bolus: Normal Saline Bolus Amount (mL): 250 mL Dialysate Change: 2K     Objective:  Vital signs in last 24 hours:  Temp:  [98.2 F (36.8 C)-98.3 F (36.8 C)] 98.3 F (36.8 C) (08/02 1200) Pulse Rate:  [69-104] 89 (08/02 1210) Resp:  [11-24] 15 (08/02 1210) BP: (84-139)/(53-93) 139/85 (08/02 1210) SpO2:  [93 %-100 %] 100 % (08/02 1210) Weight:  [89.6 kg (197 lb 8.5 oz)] 89.6 kg (197 lb 8.5 oz) (08/02 0940)  Weight change:  Filed Weights   06/24/17 1420 06/25/17 1050 06/27/17 0940  Weight: 89.1 kg (196 lb 6.9 oz) 86.6 kg (190 lb 14.7 oz) 89.6 kg (197 lb 8.5 oz)    Intake/Output: I/O last 3 completed shifts: In: 120 [P.O.:120] Out: -    Intake/Output this shift:  Total I/O In: 480 [P.O.:480] Out: 800 [Other:800]  Physical Exam: General: NAD,   Head: Normocephalic, atraumatic. Moist oral mucosal membranes  Eyes: Anicteric,   Neck: Supple,   Lungs:  Clear to auscultation  Heart: Regular rate and rhythm  Abdomen:  Soft, nontender  Extremities: trace peripheral edema, foot braces -   Neurologic: Resting quietly  Skin: No lesions  Access: Left AVF    Basic Metabolic Panel:  Recent Labs Lab 06/21/17 1420 06/24/17 1130 06/24/17 1741 06/25/17 1108 06/26/17 1632 06/27/17 0502  NA 136 133*  --  140  --  138  K 5.4* 6.8* 5.6* 6.1* 5.1 5.5*  CL 96* 93*  --  97*  --  94*  CO2 26 26  --  31  --  31  GLUCOSE 133* 202*  --  158*  --  256*  BUN 63* 83*  --  54*  --  49*  CREATININE 13.77* 15.01*  --  11.57*  --  11.91*  CALCIUM  9.3 9.5  --  10.0  --  9.5  PHOS 3.0 3.1  --  3.3  --  3.4    Liver Function Tests:  Recent Labs Lab 06/21/17 1420 06/24/17 1130 06/25/17 1108 06/27/17 0502  ALBUMIN 3.0* 3.3* 3.1* 3.1*   No results for input(s): LIPASE, AMYLASE in the last 168 hours. No results for input(s): AMMONIA in the last 168 hours.  CBC:  Recent Labs Lab 06/21/17 1553 06/24/17 1130 06/25/17 0500  WBC 6.0 4.0 5.1  HGB 10.8* 9.7* 9.7*  HCT 31.4* 29.3* 28.9*  MCV 101.8* 101.9* 101.4*  PLT 196 205 198    Cardiac Enzymes: No results for input(s): CKTOTAL, CKMB, CKMBINDEX, TROPONINI in the last 168 hours.  BNP: Invalid input(s): POCBNP  CBG:  Recent Labs Lab 06/26/17 1153 06/26/17 1643 06/26/17 2107 06/27/17 0833 06/27/17 1241  GLUCAP 280* 75 302* 227* 188*    Microbiology: Results for orders placed or performed during the hospital encounter of 06/18/17  MRSA PCR Screening     Status: None   Collection Time: 06/19/17 10:16 PM  Result Value Ref Range Status   MRSA by PCR NEGATIVE NEGATIVE Final    Comment:        The GeneXpert MRSA Assay (FDA approved for NASAL specimens only), is one component of a  comprehensive MRSA colonization surveillance program. It is not intended to diagnose MRSA infection nor to guide or monitor treatment for MRSA infections.     Coagulation Studies: No results for input(s): LABPROT, INR in the last 72 hours.  Urinalysis: No results for input(s): COLORURINE, LABSPEC, PHURINE, GLUCOSEU, HGBUR, BILIRUBINUR, KETONESUR, PROTEINUR, UROBILINOGEN, NITRITE, LEUKOCYTESUR in the last 72 hours.  Invalid input(s): APPERANCEUR    Imaging: No results found.   Medications:    . amLODipine  10 mg Oral QPM  . aspirin  81 mg Oral Daily  . calcium acetate  1,334 mg Oral BID WC  . cinacalcet  60 mg Oral Daily  . cloNIDine  0.1 mg Oral TID  . divalproex  1,000 mg Oral QHS  . docusate sodium  100 mg Oral QHS  . epoetin (EPOGEN/PROCRIT) injection  4,000  Units Intravenous Q M,W,F-HD  . escitalopram  15 mg Oral Daily  . folic acid  2 mg Oral QHS  . heparin  5,000 Units Subcutaneous Q8H  . hydrALAZINE  25 mg Oral TID  . insulin aspart  0-15 Units Subcutaneous TID WC  . insulin aspart  0-5 Units Subcutaneous QHS  . insulin aspart  2 Units Subcutaneous TID WC  . metoprolol tartrate  50 mg Oral BID  . multivitamin  1 tablet Oral Daily  . pantoprazole  40 mg Oral Daily  . patiromer  8.4 g Oral Q24H  . QUEtiapine  200 mg Oral BID  . traZODone  100 mg Oral QHS  . vitamin B-12  1,000 mcg Oral QHS   acetaminophen **OR** acetaminophen, albuterol, bisacodyl, ipratropium, magnesium citrate, morphine injection, ondansetron **OR** ondansetron (ZOFRAN) IV, oxyCODONE, senna-docusate  Assessment/ Plan:  [name] is a 36 y.o. black female  with developmental delay, ESRD on hemodialysis, anemia, hypertension, schizophrenia, diabetes mellitus type II  Admitted 06/18/2017 for bilateral metatarsal fractures and right fibular fracture.  No surgical intervention warranted. Dr. Roland Rack evaluated patient on 7/25.   Kelly [name]  1. End Stage Renal Disease: on hemodialysis MWF schedule.   - plan for dialysis tomorrow  2. Hypertension: blood pressure at goal when patient takes her medications.  - Continue current regimen  3. Anemia of chronic kidney disease: hemoglobin 9.7 - Continue Epogen with dialysis.    4. Secondary Hyperparathyroidism:   cinacalcet, calcium acetate  5. Hyperkalemia - follow strict low potassium diet - avoid potato chips / fries - pre treatment potassium 5.5 - expected to improve with HD    LOS: 9 Kelly [name] 8/2/20181:59 PM

## 2017-06-27 NOTE — Progress Notes (Signed)
  End of hd 

## 2017-06-27 NOTE — Progress Notes (Signed)
Physical Therapy Treatment Patient Details Name: Kelly [name] MRN: [id number] DOB: [birth year] Today's Date: 06/27/2017    History of Present Illness Pt is a 36 yo F, admitted to acute care on 7/24, with multiple fx in B ft and distal fibula of R ft. Prior to admission, pt required 24 hr assist, and it is unclear of AD with amb, as pt is poor historian. PMH: bipolar disorder with significant cognitive impairment, combative, and aggressive behavior, CKD, depression, diabetes, HTN, HLD.     PT Comments    Pt fatigued from dialysis from earlier in the day. Minimally willing to participate. Treatment limited to B elbow flex with yellow TB, with motivation through singing "row, row, row your boat," and L hip abd un side-lying. Bed mobility, tranfers, and ambulation not performed, as pt was tired and unwilling to participate. Pt cont to present with the following deficits: strength, endurance, balance, and pain. Overall, pt responded well, with no adverse affects to the treatment, and is progressing towards therapy goals. SPT discussed importance of participating in tomorrows session and getting up to chair; pt agreed to try tomorrow. Pt would benefit from skilled PT to address the previously mentioned impairments and promote return to PLOF. Currently recommending SNF, pending d/c.     Follow Up Recommendations  SNF     Equipment Recommendations  Rolling walker with 5" wheels    Recommendations for Other Services       Precautions / Restrictions Precautions Precautions: Fall Required Braces or Orthoses: Other Brace/Splint Other Brace/Splint: long cam walker boot on R and short cam walker boot on L. Restrictions Weight Bearing Restrictions: Yes RLE Weight Bearing: Weight bearing as tolerated LLE Weight Bearing: Weight bearing as tolerated    Mobility  Bed Mobility               General bed mobility comments: Not attempted due to pt fatigue and pt refusal.   Transfers                 General transfer comment: Not attempted due to pt fatigue and pt refusal.   Ambulation/Gait             General Gait Details: Not attempted due to pt fatigue and pt refusal.    Stairs            Wheelchair Mobility    Modified Rankin (Stroke Patients Only)       Balance                                            Cognition Arousal/Alertness: Awake/alert (Reported fatigue, due to dialysis earlier in day.) Behavior During Therapy: Impulsive Overall Cognitive Status: Impaired/Different from baseline Area of Impairment: Safety/judgement;Following commands                       Following Commands: Follows multi-step commands inconsistently Safety/Judgement: Decreased awareness of safety     General Comments: Pt with cognitive impairments at baseline; with child-like demeanor. Pt with history of being aggressive and combative. Responds best to imaginary scenarios; for example: using "Luther Parody magic," and "abbracadabra," Responds well to motivation of cookies, candy, and coloring in princess book.       Exercises Other Exercises Other Exercises: Supine therex performed to B UE's and LE's with supervision x5 reps: elbow flex to B UE's simultaneously with yellow  TB and then 5x elbow flex to R UE with yellow TB (while singing "row,row,row your boat); L hip abd in sidelying against gravity, with B knees flexed to ~65 deg. Pt unwilling to participate in additional therex, stating that she was too tired and was going to sleep.     General Comments        Pertinent Vitals/Pain Pain Assessment: Faces Faces Pain Scale: Hurts a little bit Pain Location: B feet.  when touching or moving  Pain Descriptors / Indicators: Discomfort Pain Intervention(s): Limited activity within patient's tolerance;Monitored during session;Repositioned    Home Living                      Prior Function            PT Goals (current goals  can now be found in the care plan section) Acute Rehab PT Goals Patient Stated Goal: to go home PT Goal Formulation: With patient Time For Goal Achievement: 07/04/17 Potential to Achieve Goals: Fair Progress towards PT goals: Progressing toward goals    Frequency    Min 2X/week      PT Plan Current plan remains appropriate    Co-evaluation              AM-PAC PT "6 Clicks" Daily Activity  Outcome Measure  Difficulty turning over in bed (including adjusting bedclothes, sheets and blankets)?: Total Difficulty moving from lying on back to sitting on the side of the bed? : Total Difficulty sitting down on and standing up from a chair with arms (e.g., wheelchair, bedside commode, etc,.)?: Total Help needed moving to and from a bed to chair (including a wheelchair)?: A Lot Help needed walking in hospital room?: A Lot Help needed climbing 3-5 steps with a railing? : Total 6 Click Score: 8    End of Session   Activity Tolerance: Patient limited by fatigue Patient left: in bed;with bed alarm set;with call bell/phone within reach Nurse Communication: Mobility status PT Visit Diagnosis: Unsteadiness on feet (R26.81);Other abnormalities of gait and mobility (R26.89);Muscle weakness (generalized) (M62.81);Pain Pain - part of body: Ankle and joints of foot     Time: 9741-6384 PT Time Calculation (min) (ACUTE ONLY): 12 min  Charges:                       G Codes:       Oran Rein PT, SPT  Bevelyn Ngo 06/27/2017, 5:03 PM

## 2017-06-27 NOTE — Progress Notes (Signed)
Pre dialysis  

## 2017-06-27 NOTE — Progress Notes (Signed)
PT Cancellation Note  Patient Details Name: Kelly [name] MRN: [id number] DOB: 15-Nov-1981   Cancelled Treatment:    Reason Eval/Treat Not Completed: Patient at procedure or test/unavailable   Pt out of room for dialysis.  Will continue as appropriate.   Chesley Noon 06/27/2017, 11:23 AM

## 2017-06-27 NOTE — Progress Notes (Signed)
Patient much more cooperative than Tuesday night. Patient took medications and allowed lab to draw morning labs. No complaints of pain. Continues to refuse appropriate/ ordered diet. Patient educated about the importance of renal diet, as well as the need for HD today. Patient acknowledged education. Will continue to monitor.

## 2017-06-27 NOTE — Progress Notes (Signed)
[facility] [place] NAME: Kelly [name]    MR#:  [id number]  DATE OF BIRTH:  1980-12-03  SUBJECTIVE:  CHIEF COMPLAINT:   Chief Complaint  Patient presents with  . Foot Pain   HD today Non compliant  REVIEW OF SYSTEMS:    Review of Systems  Constitutional: Negative for chills and fever.  HENT: Negative for sore throat.   Eyes: Negative for blurred vision, double vision and pain.  Respiratory: Negative for cough, hemoptysis, shortness of breath and wheezing.   Cardiovascular: Negative for chest pain, palpitations, orthopnea and leg swelling.  Gastrointestinal: Negative for abdominal pain, constipation, diarrhea, heartburn, nausea and vomiting.  Genitourinary: Negative for dysuria and hematuria.  Musculoskeletal: Positive for joint pain. Negative for back pain.  Skin: Negative for rash.  Neurological: Negative for sensory change, speech change, focal weakness and headaches.  Endo/Heme/Allergies: Does not bruise/bleed easily.  Psychiatric/Behavioral: Negative for depression. The patient is not nervous/anxious.     DRUG ALLERGIES:  No Known Allergies  VITALS:  Blood pressure 134/87, pulse 88, temperature 98.2 F (36.8 C), temperature source Oral, resp. rate 15, height 5' (1.524 m), weight 89.6 kg (197 lb 8.5 oz), SpO2 100 %.  PHYSICAL EXAMINATION:   Physical Exam  GENERAL:  36 y.o.-year-old patient lying in the bed with no acute distress.  EYES: Pupils equal, round, reactive to light and accommodation. No scleral icterus. Extraocular muscles intact.  HEENT: Head atraumatic, normocephalic. Oropharynx and nasopharynx clear.  NECK:  Supple, no jugular venous distention. No thyroid enlargement, no tenderness.  LUNGS: Normal breath sounds bilaterally, no wheezing, rales, rhonchi. No use of accessory muscles of respiration.  CARDIOVASCULAR: S1, S2 normal. No murmurs, rubs, or gallops.  ABDOMEN: Soft, nontender, nondistended. Bowel  sounds present. No organomegaly or mass.  EXTREMITIES: No cyanosis, clubbing or edema b/l.   Tenderness b/l feet. NEUROLOGIC: Cranial nerves II through XII are intact. No focal Motor or sensory deficits b/l.   PSYCHIATRIC: The patient is alert and awake. Flat affect SKIN: No obvious rash, lesion, or ulcer.   LABORATORY PANEL:   CBC  Recent Labs Lab 06/25/17 0500  WBC 5.1  HGB 9.7*  HCT 28.9*  PLT 198   ------------------------------------------------------------------------------------------------------------------ Chemistries   Recent Labs Lab 06/27/17 0502  NA 138  K 5.5*  CL 94*  CO2 31  GLUCOSE 256*  BUN 49*  CREATININE 11.91*  CALCIUM 9.5   ------------------------------------------------------------------------------------------------------------------  Cardiac Enzymes No results for input(s): TROPONINI in the last 168 hours. ------------------------------------------------------------------------------------------------------------------  RADIOLOGY:  No results found.   ASSESSMENT AND PLAN:   This is a 36 y.o. female with a history of Bipolar disorder with significant cognitive impairment, combative and aggressive behavior, CKD, depression, diabetes, hypertension, hyperlipidemia  now being admitted with:  # Hyperkalemia. Due to inadequate hemodialysis. Patient has refused hemodialysis on multiple occasions.  hemodialysis today.  Received kayexalate twice this admission and on Veltessa  # Multiple nondisplaced fractures of bilateral feet - Ortho consult appreciated. Boots ordered. Can bear weight. No surgery. Pain meds PRN. - PT is working with her regularly  # End-stage renal disease -Continue PhosLo, Sensipar - Nephro consult for HD. Appreciate help. - MWF schedule Dialysis today  # History of Bipolar -Continue Depakote, Lexapro, Seroquel, trazodone  # History of GERD - Continue Protonix  # History of hypertension - Continue Norvasc,  clonidine, hydralazine, Avapro, metoprolol.  # History of  Diabetes SSI  All the records are reviewed and case discussed with  Care Management/Social Worker Management plans discussed with the patient.  CODE STATUS: FULL CODE  DVT Prophylaxis: SCDs  TOTAL TIME TAKING CARE OF THIS PATIENT: 25 minutes.   Hillary Bow R M.D on 06/27/2017 at 11:17 AM  Between 7am to 6pm - Pager - [phone]  After 6pm go to [email or web address] - password [email or web address] [facility]  Office  [phone]  CC: Primary care physician; Remi Haggard, FNP  Note: This dictation was prepared with Dragon dictation along with smaller phrase technology. Any transcriptional errors that result from this process are unintentional.

## 2017-06-27 NOTE — Progress Notes (Signed)
Post hd vitals 

## 2017-06-27 NOTE — Progress Notes (Signed)
Hd start, bp low, uf off, pt no c/o

## 2017-06-27 NOTE — Progress Notes (Signed)
Pre hd assessment. Patient currently has no complaints

## 2017-06-28 LAB — GLUCOSE, CAPILLARY
GLUCOSE-CAPILLARY: 273 mg/dL — AB (ref 65–99)
GLUCOSE-CAPILLARY: 122 mg/dL — AB (ref 65–99)
GLUCOSE-CAPILLARY: 166 mg/dL — AB (ref 65–99)
Glucose-Capillary: 397 mg/dL — ABNORMAL HIGH (ref 65–99)
Glucose-Capillary: 380 mg/dL — ABNORMAL HIGH (ref 65–99)

## 2017-06-28 LAB — POTASSIUM: POTASSIUM: 5.1 mmol/L (ref 3.5–5.1)

## 2017-06-28 NOTE — Progress Notes (Signed)
Dialysis called stating that patient was refusing Dialysis treatment. This nurse went to dialysis to speak with patient. Patient was adamant that she was not going to be dialyzed today and that she did not care about having dialysis done. Nephrologist aware and Hospitalist- Dr. Posey Pronto paged, Patient to return to the unit.

## 2017-06-28 NOTE — Progress Notes (Signed)
Patient refused HD when she got down to the suite. Would not allow cannulation We have arranged for outpatient dialysis tomorrow at [facility] at 11.45 Patient OK to be discharged from renal standpoint

## 2017-06-28 NOTE — Progress Notes (Signed)
Patient refusing all medications and care at this time. Yelling NO!, and refusing to open eyes and interact with nurse. Unable to administer medications at this time. Patient observed lying in bed, with eyes closed, arms folded across chest and  bed alarm on.

## 2017-06-28 NOTE — Progress Notes (Signed)
Hemodialysis- Patient brought to HD unit. Allowed staff to place bp cuff and telemetry. When about to cannulate patient patient states "NO. I aint doin it." Dr. Candiss Norse at bedside. Talked to patient about need for adequate dialysis. Patient continued to refuse. Primary RN also came to unit to discuss with patient. Patient still refusing. Transport notified. Will take patient back to room

## 2017-06-28 NOTE — Progress Notes (Signed)
Physical Therapy Treatment Patient Details Name: Kelly [name] MRN: [id number] DOB: Jul 20, 1981 Today's Date: 06/28/2017    History of Present Illness Pt is a 36 yo F, admitted to acute care on 7/24, with multiple fx in B ft and distal fibula of R ft. Prior to admission, pt required 24 hr assist, and it is unclear of AD with amb, as pt is poor historian. PMH: bipolar disorder with significant cognitive impairment, combative, and aggressive behavior, CKD, depression, diabetes, HTN, HLD.     PT Comments    Pt is pleasant and willing to participate this am. Pt was motivated by snickers candy bar, singing, and searching for vending machine and nursing friends. Pt performs bed mobility with supervision, tranfers with MinA +2, and ambulation with min-mod physical assist +2 and person for chair follow for safety. Pt amb total of 80 ft with RW, requiring intermittent standing rest breaks; initially pt modA, progressing to MinA. Pt cont to present with the following deficits: strength, power, endurance, balance, and pain. Pt balance is primarily limited by B walking boots. Overall, pt responded well to today's treatment with no adverse affects, and is progressing well towards goals. Pt would benefit from skilled PT to address the previously mentioned impairments and promote return to PLOF. Currently recommending group home with HHPT.    Follow Up Recommendations  Home health PT;Other (comment) (group home )     Equipment Recommendations  Rolling walker with 5" wheels    Recommendations for Other Services       Precautions / Restrictions Precautions Precautions: Fall Required Braces or Orthoses: Other Brace/Splint Other Brace/Splint: long cam walker boot on R and short cam walker boot on L. Restrictions Weight Bearing Restrictions: Yes RLE Weight Bearing: Weight bearing as tolerated LLE Weight Bearing: Weight bearing as tolerated    Mobility  Bed Mobility Overal bed mobility: Needs  Assistance Bed Mobility: Supine to Sit     Supine to sit: Supervision     General bed mobility comments: Supervision with supine to sit, requiring min cues for sequencing. Pt with poor safety awareness.   Transfers Overall transfer level: Needs assistance Equipment used: Rolling walker (2 wheeled) Transfers: Sit to/from Stand Sit to Stand: (P) Min assist;+2 physical assistance         General transfer comment: (P) Pt MinA +2 for STS transfers, requiring min cues for mechancis and safety. Pt with good carryover from previous sessions.   Ambulation/Gait Ambulation/Gait assistance: Min assist;Mod assist;+2 physical assistance Ambulation Distance (Feet): 80 Feet Assistive device: Rolling walker (2 wheeled) Gait Pattern/deviations: Step-through pattern;Decreased step length - right;Decreased step length - left;Decreased stride length     General Gait Details: Pt Min-ModA +2 for physical assists, with additional person with chair follow for safety. Pt initially [place], but progressed to MinA with increased confidence and motivation. Pt with intermittent standing rest breaks every 5-10 ft, presenting with post postural sway during static standing and requiring intermittent ModA, due to impaired balance from walker boots.    Stairs            Wheelchair Mobility    Modified Rankin (Stroke Patients Only)       Balance                                            Cognition Arousal/Alertness: Awake/alert Behavior During Therapy: Impulsive Overall Cognitive Status: Impaired/Different from baseline  Area of Impairment: Safety/judgement;Following commands                       Following Commands: Follows multi-step commands inconsistently Safety/Judgement: Decreased awareness of safety     General Comments: Pt with cognitive impairments at baseline; with child-like demeanor. Pt with history of being aggressive and combative. Responds best to  imaginary scenarios; for example: using "Luther Parody magic," and "abbracadabra," Responds well to motivation of cookies, candy, and coloring in princess book.       Exercises Other Exercises Other Exercises: Supine therex performed with supervision to B LE's: SLR x6 reps, glute sets x10 reps, and ankle pumps x10 reps.     General Comments        Pertinent Vitals/Pain Pain Assessment: Faces Faces Pain Scale: Hurts a little bit Pain Location: B feet.  when touching or moving  Pain Descriptors / Indicators: Discomfort Pain Intervention(s): Limited activity within patient's tolerance;Monitored during session    Home Living                      Prior Function            PT Goals (current goals can now be found in the care plan section) Acute Rehab PT Goals Patient Stated Goal: to go home PT Goal Formulation: With patient Time For Goal Achievement: 07/04/17 Potential to Achieve Goals: Good Progress towards PT goals: Progressing toward goals    Frequency    Min 2X/week      PT Plan Discharge plan needs to be updated    Co-evaluation              AM-PAC PT "6 Clicks" Daily Activity  Outcome Measure  Difficulty turning over in bed (including adjusting bedclothes, sheets and blankets)?: A Little Difficulty moving from lying on back to sitting on the side of the bed? : A Little Difficulty sitting down on and standing up from a chair with arms (e.g., wheelchair, bedside commode, etc,.)?: Total Help needed moving to and from a bed to chair (including a wheelchair)?: A Lot Help needed walking in hospital room?: A Lot Help needed climbing 3-5 steps with a railing? : Total 6 Click Score: 12    End of Session Equipment Utilized During Treatment: Gait belt Activity Tolerance: Patient tolerated treatment well Patient left: in chair;with call bell/phone within reach;with chair alarm set;Other (comment) (MD present in room) Nurse Communication: Mobility  status PT Visit Diagnosis: Unsteadiness on feet (R26.81);Other abnormalities of gait and mobility (R26.89);Muscle weakness (generalized) (M62.81);Pain Pain - part of body: Ankle and joints of foot     Time: 3875-6433 PT Time Calculation (min) (ACUTE ONLY): 34 min  Charges:                       G Codes:       Oran Rein PT, SPT  Bevelyn Ngo 06/28/2017, 12:46 PM

## 2017-06-28 NOTE — Progress Notes (Signed)
Inpatient Diabetes Program Recommendations  AACE/ADA: New Consensus Statement on Inpatient Glycemic Control (2015)  Target Ranges:  Prepandial:   less than 140 mg/dL      Peak postprandial:   less than 180 mg/dL (1-2 hours)      Critically ill patients:  140 - 180 mg/dL   Lab Results  Component Value Date   GLUCAP 397 (H) 06/28/2017   HGBA1C 5.7 10/07/2012    Review of Glycemic ControlResults for ABISAI, COBLE (MRN [id number]) as of 06/28/2017 08:51  Ref. Range 06/27/2017 08:33 06/27/2017 12:41 06/27/2017 16:54 06/27/2017 21:32 06/28/2017 07:57  Glucose-Capillary Latest Ref Range: 65 - 99 mg/dL 227 (H) 188 (H) 264 (H) 150 (H) 397 (H)   Diabetes history: Type 2 diabetes Outpatient Diabetes medications: Novolog moderate tid with meals Current orders for Inpatient glycemic control:  Novolog moderate tid with meals and HS, Novolog 2 units tid with meals  Inpatient Diabetes Program Recommendations:   Consider adding Lantus 5 units daily.  Also consider decreasing Novolog correction tid sensitive tid with meals.    Thanks, Adah Perl, RN, BC-ADM Inpatient Diabetes Coordinator Pager [phone] (8a-5p)

## 2017-06-28 NOTE — Progress Notes (Signed)
[facility]  ROUNDING NOTE   Subjective:   Patient walked in the hallway with PT Otherwise doing good No c/o   Objective:  Vital signs in last 24 hours:  Temp:  [98.3 F (36.8 C)] 98.3 F (36.8 C) (08/02 1200) Pulse Rate:  [88-98] 94 (08/02 2323) Resp:  [11-19] 19 (08/02 2323) BP: (104-139)/(66-93) 104/66 (08/02 2323) SpO2:  [96 %-100 %] 96 % (08/02 2323)  Weight change:  Filed Weights   06/24/17 1420 06/25/17 1050 06/27/17 0940  Weight: 89.1 kg (196 lb 6.9 oz) 86.6 kg (190 lb 14.7 oz) 89.6 kg (197 lb 8.5 oz)    Intake/Output: I/O last 3 completed shifts: In: 1440 [P.O.:1440] Out: 800 [Other:800]   Intake/Output this shift:  Total I/O In: 240 [P.O.:240] Out: -   Physical Exam: General: NAD,   Head: Normocephalic, atraumatic. Moist oral mucosal membranes  Eyes: Anicteric,   Neck: Supple,   Lungs:  Clear to auscultation  Heart: Regular rate and rhythm  Abdomen:  Soft, nontender  Extremities: trace peripheral edema, foot braces -   Neurologic: Resting quietly  Skin: No lesions  Access: Left upper arm AVF    Basic Metabolic Panel:  Recent Labs Lab 06/21/17 1420 06/24/17 1130 06/24/17 1741 06/25/17 1108 06/26/17 1632 06/27/17 0502 06/28/17 0411  NA 136 133*  --  140  --  138  --   K 5.4* 6.8* 5.6* 6.1* 5.1 5.5* 5.1  CL 96* 93*  --  97*  --  94*  --   CO2 26 26  --  31  --  31  --   GLUCOSE 133* 202*  --  158*  --  256*  --   BUN 63* 83*  --  54*  --  49*  --   CREATININE 13.77* 15.01*  --  11.57*  --  11.91*  --   CALCIUM 9.3 9.5  --  10.0  --  9.5  --   PHOS 3.0 3.1  --  3.3  --  3.4  --     Liver Function Tests:  Recent Labs Lab 06/21/17 1420 06/24/17 1130 06/25/17 1108 06/27/17 0502  ALBUMIN 3.0* 3.3* 3.1* 3.1*   No results for input(s): LIPASE, AMYLASE in the last 168 hours. No results for input(s): AMMONIA in the last 168 hours.  CBC:  Recent Labs Lab 06/21/17 1553 06/24/17 1130 06/25/17 0500  WBC 6.0 4.0 5.1   HGB 10.8* 9.7* 9.7*  HCT 31.4* 29.3* 28.9*  MCV 101.8* 101.9* 101.4*  PLT 196 205 198    Cardiac Enzymes: No results for input(s): CKTOTAL, CKMB, CKMBINDEX, TROPONINI in the last 168 hours.  BNP: Invalid input(s): POCBNP  CBG:  Recent Labs Lab 06/27/17 1241 06/27/17 1654 06/27/17 2132 06/28/17 0757 06/28/17 0925  GLUCAP 188* 264* 150* 397* 380*    Microbiology: Results for orders placed or performed during the hospital encounter of 06/18/17  MRSA PCR Screening     Status: None   Collection Time: 06/19/17 10:16 PM  Result Value Ref Range Status   MRSA by PCR NEGATIVE NEGATIVE Final    Comment:        The GeneXpert MRSA Assay (FDA approved for NASAL specimens only), is one component of a comprehensive MRSA colonization surveillance program. It is not intended to diagnose MRSA infection nor to guide or monitor treatment for MRSA infections.     Coagulation Studies: No results for input(s): LABPROT, INR in the last 72 hours.  Urinalysis: No results for input(s):  COLORURINE, LABSPEC, [place], GLUCOSEU, HGBUR, BILIRUBINUR, KETONESUR, PROTEINUR, UROBILINOGEN, NITRITE, LEUKOCYTESUR in the last 72 hours.  Invalid input(s): APPERANCEUR    Imaging: No results found.   Medications:    . amLODipine  10 mg Oral QPM  . aspirin  81 mg Oral Daily  . calcium acetate  1,334 mg Oral BID WC  . cinacalcet  60 mg Oral Daily  . cloNIDine  0.1 mg Oral TID  . divalproex  1,000 mg Oral QHS  . docusate sodium  100 mg Oral QHS  . epoetin (EPOGEN/PROCRIT) injection  4,000 Units Intravenous Q M,W,F-HD  . escitalopram  15 mg Oral Daily  . folic acid  2 mg Oral QHS  . heparin  5,000 Units Subcutaneous Q8H  . hydrALAZINE  25 mg Oral TID  . insulin aspart  0-15 Units Subcutaneous TID WC  . insulin aspart  0-5 Units Subcutaneous QHS  . insulin aspart  2 Units Subcutaneous TID WC  . metoprolol tartrate  50 mg Oral BID  . multivitamin  1 tablet Oral Daily  . pantoprazole  40  mg Oral Daily  . patiromer  8.4 g Oral Q24H  . QUEtiapine  200 mg Oral BID  . traZODone  100 mg Oral QHS  . vitamin B-12  1,000 mcg Oral QHS   acetaminophen **OR** acetaminophen, albuterol, bisacodyl, ipratropium, magnesium citrate, morphine injection, ondansetron **OR** ondansetron (ZOFRAN) IV, oxyCODONE, senna-docusate  Assessment/ Plan:  [name] is a 36 y.o. black female  with developmental delay, ESRD on hemodialysis, anemia, hypertension, schizophrenia, diabetes mellitus type II  Admitted 06/18/2017 for bilateral metatarsal fractures and right fibular fracture.  No surgical intervention warranted. Dr. Roland [name] evaluated patient on 7/25.   Kelly [name]  1. End Stage Renal Disease: on hemodialysis MWF schedule.   - HD today to get her back on schedule  2. Hypertension: blood pressure at goal when patient takes her medications.  - Continue current regimen  3. Anemia of chronic kidney disease: hemoglobin 9.7 - Continue Epogen with dialysis.    4. Secondary Hyperparathyroidism:   cinacalcet, calcium acetate  5. Hyperkalemia - follow strict low potassium diet - avoid potato chips / fries      LOS: 10 Kelly [name] 8/3/201810:39 AM

## 2017-06-28 NOTE — Progress Notes (Signed)
Plan is for patient to remain at [facility] until she can ambulate enough to discharge back to [facility] in [place]. Clinical Social Worker (CSW) will continue to follow and assist as needed.   [employer], LCSW [phone]

## 2017-06-28 NOTE — Progress Notes (Signed)
Patient walked over 50 feet today with PT. Per MD patient is medically stable for D/C today. Patient refused dialysis today and will need dialysis tomorrow. Clinical Education officer, museum (CSW) contacted Mr. Pink group home owner and made him aware of above. Per [name] they are not going to be able to accept patient back today or over the weekend because they don't have the staff for dialysis sitters. Per [name] him and his wife are out of town this weekend. Mr. Oswaldo Milian stated that he would accept patient back on Monday 07/01/17. CSW contacted patient's guardian Tedra Coupe and made her aware of above. MD, RN and CSW director aware of above.  [employer], LCSW [phone]

## 2017-06-28 NOTE — Progress Notes (Signed)
Patient transported to Dialysis via bed with Transporter- CCMD made aware.

## 2017-06-28 NOTE — Progress Notes (Signed)
[place] at [place] NAME: Kelly [name]    MR#:  [id number]  DATE OF BIRTH:  11-18-1981  SUBJECTIVE:   Ongoing issues with refusal of meds, fingerstick glucose check, noncompliance with diet, and refusal for HD. Walked with PT today  REVIEW OF SYSTEMS:    Review of Systems  Constitutional: Negative for chills and fever.  HENT: Negative for sore throat.   Eyes: Negative for blurred vision, double vision and pain.  Respiratory: Negative for cough, hemoptysis, shortness of breath and wheezing.   Cardiovascular: Negative for chest pain, palpitations, orthopnea and leg swelling.  Gastrointestinal: Negative for abdominal pain, constipation, diarrhea, heartburn, nausea and vomiting.  Genitourinary: Negative for dysuria and hematuria.  Musculoskeletal: Positive for joint pain. Negative for back pain.  Skin: Negative for rash.  Neurological: Negative for sensory change, speech change, focal weakness and headaches.  Endo/Heme/Allergies: Does not bruise/bleed easily.  Psychiatric/Behavioral: Negative for depression. The patient is not nervous/anxious.     DRUG ALLERGIES:  No Known Allergies  VITALS:  Blood pressure (!) 127/48, pulse 81, temperature 97.8 F (36.6 C), temperature source Oral, resp. rate 19, height 5' (1.524 m), weight 89.6 kg (197 lb 8.5 oz), SpO2 100 %.  PHYSICAL EXAMINATION:   Physical Exam  GENERAL:  36 y.o.-year-old patient lying in the bed with no acute distress.  EYES: Pupils equal, round, reactive to light and accommodation. No scleral icterus. Extraocular muscles intact.  HEENT: Head atraumatic, normocephalic. Oropharynx and nasopharynx clear.  NECK:  Supple, no jugular venous distention. No thyroid enlargement, no tenderness.  LUNGS: Normal breath sounds bilaterally, no wheezing, rales, rhonchi. No use of accessory muscles of respiration.  CARDIOVASCULAR: S1, S2 normal. No murmurs, rubs, or gallops.  ABDOMEN: Soft,  nontender, nondistended. Bowel sounds present. No organomegaly or mass.  EXTREMITIES: No cyanosis, clubbing or edema b/l.   Tenderness b/l feet. NEUROLOGIC: Cranial nerves II through XII are intact. No focal Motor or sensory deficits b/l.   PSYCHIATRIC: The patient is alert and awake. Flat affect SKIN: No obvious rash, lesion, or ulcer.   LABORATORY PANEL:   CBC  Recent Labs Lab 06/25/17 0500  WBC 5.1  HGB 9.7*  HCT 28.9*  PLT 198   ------------------------------------------------------------------------------------------------------------------ Chemistries   Recent Labs Lab 06/27/17 0502 06/28/17 0411  NA 138  --   K 5.5* 5.1  CL 94*  --   CO2 31  --   GLUCOSE 256*  --   BUN 49*  --   CREATININE 11.91*  --   CALCIUM 9.5  --    ------------------------------------------------------------------------------------------------------------------  Cardiac Enzymes No results for input(s): TROPONINI in the last 168 hours. ------------------------------------------------------------------------------------------------------------------  RADIOLOGY:  No results found.   ASSESSMENT AND PLAN:   36 y.o. female with a history of Bipolar disorder with significant cognitive impairment, combative and aggressive behavior, CKD, depression, diabetes, hypertension, hyperlipidemia  now being admitted with:  # Hyperkalemia. Due to inadequate hemodialysis. Patient has refused hemodialysis on multiple occasions. Schedule for hemodialysis again today. Received 1 dose of Kayexalate yesterday. Repeat labs during hemodialysis.  # Multiple nondisplaced fractures of bilateral feet - Ortho consult appreciated. Boots ordered. Can bear weight. No surgery. Pain meds PRN. - PT is working with her regularly---walked today with PT with walker  # End-stage renal disease -Continue PhosLo, Sensipar - Nephro consult for HD. Appreciate help. - MWF schedule -non compliance with HD  # History of  Bipolar -Continue Depakote, Lexapro, Seroquel, trazodone  # History of  GERD - Continue Protonix  # History of hypertension - Continue Norvasc, clonidine, hydralazine, Avapro, metoprolol.  # History of  Diabetes SSI  Will keep pt over the weekned and d/c back to group home on Monday  All the records are reviewed and case discussed with Care Management/Social Worker Management plans discussed with the patient.  CODE STATUS: FULL CODE  DVT Prophylaxis: SCDs  TOTAL TIME TAKING CARE OF THIS PATIENT: 25 minutes.    Rylie Knierim M.D on 06/28/2017 at 4:51 PM  Between 7am to 6pm - Pager - [phone]  After 6pm go to [email or web address] - password [email or web address]  Office  [phone]  CC: Primary care physician; Remi Haggard, FNP  Note: This dictation was prepared with Dragon dictation along with smaller phrase technology. Any transcriptional errors that result from this process are unintentional.

## 2017-06-29 LAB — CBC
HCT: 27.6 % — ABNORMAL LOW (ref 35.0–47.0)
Hemoglobin: 9.2 g/dL — ABNORMAL LOW (ref 12.0–16.0)
MCH: 33.4 pg (ref 26.0–34.0)
MCHC: 33.5 g/dL (ref 32.0–36.0)
MCV: 99.9 fL (ref 80.0–100.0)
PLATELETS: 200 10*3/uL (ref 150–440)
RBC: 2.76 MIL/uL — ABNORMAL LOW (ref 3.80–5.20)
RDW: 15.5 % — AB (ref 11.5–14.5)
WBC: 4.8 10*3/uL (ref 3.6–11.0)

## 2017-06-29 LAB — RENAL FUNCTION PANEL
ANION GAP: 11 (ref 5–15)
Albumin: 3 g/dL — ABNORMAL LOW (ref 3.5–5.0)
BUN: 58 mg/dL — ABNORMAL HIGH (ref 6–20)
CALCIUM: 9.1 mg/dL (ref 8.9–10.3)
CO2: 25 mmol/L (ref 22–32)
Chloride: 100 mmol/L — ABNORMAL LOW (ref 101–111)
Creatinine, Ser: 11.96 mg/dL — ABNORMAL HIGH (ref 0.44–1.00)
GFR calc non Af Amer: 4 mL/min — ABNORMAL LOW (ref >60)
GFR, EST AFRICAN AMERICAN: 4 mL/min — AB (ref >60)
Glucose, Bld: 157 mg/dL — ABNORMAL HIGH (ref 65–99)
POTASSIUM: 5.1 mmol/L (ref 3.5–5.1)
Phosphorus: 3.3 mg/dL (ref 2.5–4.6)
SODIUM: 136 mmol/L (ref 135–145)

## 2017-06-29 LAB — GLUCOSE, CAPILLARY
GLUCOSE-CAPILLARY: 209 mg/dL — AB (ref 65–99)
Glucose-Capillary: 324 mg/dL — ABNORMAL HIGH (ref 65–99)
Glucose-Capillary: 250 mg/dL — ABNORMAL HIGH (ref 65–99)

## 2017-06-29 NOTE — Progress Notes (Signed)
PT Cancellation Note  Patient Details Name: Kelly [name] MRN: [id number] DOB: 1981-08-23   Cancelled Treatment:    Reason Eval/Treat Not Completed: Fatigue/lethargy limiting ability to participate   Attempted x 3 this am.  Pt in bed with eyes closed.  Would not open eyes despite verbal and tactile cues.  Would open briefly and then close them and turn head away.  Will continue as appropriate.   Chesley Noon 06/29/2017, 11:13 AM

## 2017-06-29 NOTE — Progress Notes (Signed)
Patient resting in bed, no complaints. Sleeping between care. Continue to monitor.

## 2017-06-29 NOTE — Progress Notes (Signed)
HD COMPLETED  

## 2017-06-29 NOTE — Progress Notes (Signed)
[facility]  ROUNDING NOTE   Subjective:   Doing well today. No acute c/o Ate full breakfast Resting quietly this morning   Objective:  Vital signs in last 24 hours:  Temp:  [98.7 F (37.1 C)-99.7 F (37.6 C)] 99.7 F (37.6 C) (08/04 0726) Pulse Rate:  [63-85] 71 (08/04 0840) Resp:  [19] 19 (08/03 2017) BP: (118-130)/(48-84) 130/60 (08/04 0840) SpO2:  [100 %] 100 % (08/04 0726)  Weight change:  Filed Weights   06/24/17 1420 06/25/17 1050 06/27/17 0940  Weight: 89.1 kg (196 lb 6.9 oz) 86.6 kg (190 lb 14.7 oz) 89.6 kg (197 lb 8.5 oz)    Intake/Output: I/O last 3 completed shifts: In: 1200 [P.O.:1200] Out: -    Intake/Output this shift:  Total I/O In: 360 [P.O.:360] Out: -   Physical Exam: General: NAD,   Head: Normocephalic, atraumatic. Moist oral mucosal membranes  Eyes: Anicteric,   Neck: Supple,   Lungs:  Clear to auscultation  Heart: Regular rate and rhythm  Abdomen:  Soft, nontender  Extremities: trace peripheral edema, foot braces -   Neurologic: Resting quietly  Skin: No lesions  Access: Left upper arm AVF    Basic Metabolic Panel:  Recent Labs Lab 06/24/17 1130 06/24/17 1741 06/25/17 1108 06/26/17 1632 06/27/17 0502 06/28/17 0411  NA 133*  --  140  --  138  --   K 6.8* 5.6* 6.1* 5.1 5.5* 5.1  CL 93*  --  97*  --  94*  --   CO2 26  --  31  --  31  --   GLUCOSE 202*  --  158*  --  256*  --   BUN 83*  --  54*  --  49*  --   CREATININE 15.01*  --  11.57*  --  11.91*  --   CALCIUM 9.5  --  10.0  --  9.5  --   PHOS 3.1  --  3.3  --  3.4  --     Liver Function Tests:  Recent Labs Lab 06/24/17 1130 06/25/17 1108 06/27/17 0502  ALBUMIN 3.3* 3.1* 3.1*   No results for input(s): LIPASE, AMYLASE in the last 168 hours. No results for input(s): AMMONIA in the last 168 hours.  CBC:  Recent Labs Lab 06/24/17 1130 06/25/17 0500  WBC 4.0 5.1  HGB 9.7* 9.7*  HCT 29.3* 28.9*  MCV 101.9* 101.4*  PLT 205 198    Cardiac  Enzymes: No results for input(s): CKTOTAL, CKMB, CKMBINDEX, TROPONINI in the last 168 hours.  BNP: Invalid input(s): POCBNP  CBG:  Recent Labs Lab 06/28/17 0925 06/28/17 1146 06/28/17 1654 06/28/17 2118 06/29/17 0724  GLUCAP 380* 273* 122* 166* 324*    Microbiology: Results for orders placed or performed during the hospital encounter of 06/18/17  MRSA PCR Screening     Status: None   Collection Time: 06/19/17 10:16 PM  Result Value Ref Range Status   MRSA by PCR NEGATIVE NEGATIVE Final    Comment:        The GeneXpert MRSA Assay (FDA approved for NASAL specimens only), is one component of a comprehensive MRSA colonization surveillance program. It is not intended to diagnose MRSA infection nor to guide or monitor treatment for MRSA infections.     Coagulation Studies: No results for input(s): LABPROT, INR in the last 72 hours.  Urinalysis: No results for input(s): COLORURINE, LABSPEC, PHURINE, GLUCOSEU, HGBUR, BILIRUBINUR, KETONESUR, PROTEINUR, UROBILINOGEN, NITRITE, LEUKOCYTESUR in the last 72 hours.  Invalid  input(s): APPERANCEUR    Imaging: No results found.   Medications:    . amLODipine  10 mg Oral QPM  . aspirin  81 mg Oral Daily  . calcium acetate  1,334 mg Oral BID WC  . cinacalcet  60 mg Oral Daily  . cloNIDine  0.1 mg Oral TID  . divalproex  1,000 mg Oral QHS  . docusate sodium  100 mg Oral QHS  . epoetin (EPOGEN/PROCRIT) injection  4,000 Units Intravenous Q M,W,F-HD  . escitalopram  15 mg Oral Daily  . folic acid  2 mg Oral QHS  . heparin  5,000 Units Subcutaneous Q8H  . hydrALAZINE  25 mg Oral TID  . insulin aspart  0-15 Units Subcutaneous TID WC  . insulin aspart  0-5 Units Subcutaneous QHS  . insulin aspart  2 Units Subcutaneous TID WC  . metoprolol tartrate  50 mg Oral BID  . multivitamin  1 tablet Oral Daily  . pantoprazole  40 mg Oral Daily  . patiromer  8.4 g Oral Q24H  . QUEtiapine  200 mg Oral BID  . traZODone  100 mg Oral  QHS  . vitamin B-12  1,000 mcg Oral QHS   acetaminophen **OR** acetaminophen, albuterol, bisacodyl, ipratropium, magnesium citrate, morphine injection, ondansetron **OR** ondansetron (ZOFRAN) IV, oxyCODONE, senna-docusate  Assessment/ Plan:  [name] is a 36 y.o. black female  with developmental delay, ESRD on hemodialysis, anemia, hypertension, schizophrenia, diabetes mellitus type II  Admitted 06/18/2017 for bilateral metatarsal fractures and right fibular fracture.  No surgical intervention warranted. Dr. Roland Rack evaluated patient on 7/25.   [place]  1. End Stage Renal Disease: on hemodialysis MWF schedule.   - HD today since she refused on Friday - next HD on Monday then possible d/c  2. Hypertension: blood pressure at goal when patient takes her medications.  - Continue current regimen  3. Anemia of chronic kidney disease: hemoglobin 9.7 - Continue Epogen with dialysis.    4. Secondary Hyperparathyroidism:   cinacalcet, calcium acetate  5. Hyperkalemia - follow strict low potassium diet - avoid potato chips / fries      LOS: 11 Kelly [name] 8/4/201811:04 AM

## 2017-06-29 NOTE — Progress Notes (Signed)
POST DIALYSIS ASSESSMENT 

## 2017-06-29 NOTE — Progress Notes (Signed)
[place] at [place] NAME: Kelly [name]    MR#:  [id number]  DATE OF BIRTH:  1981-09-16  SUBJECTIVE:  Pleasant and calm. Watching TV. No new issues per RN  REVIEW OF SYSTEMS:    Review of Systems  Unable to perform ROS: Psychiatric disorder    DRUG ALLERGIES:  No Known Allergies  VITALS:  Blood pressure 118/73, pulse 85, temperature 99.7 F (37.6 C), temperature source Oral, resp. rate 19, height 5' (1.524 m), weight 89.6 kg (197 lb 8.5 oz), SpO2 100 %.  PHYSICAL EXAMINATION:   Physical Exam  GENERAL:  36 y.o.-year-old patient lying in the bed with no acute distress. obese EYES: Pupils equal, round, reactive to light and accommodation. No scleral icterus. Extraocular muscles intact.  HEENT: Head atraumatic, normocephalic. Oropharynx and nasopharynx clear.  NECK:  Supple, no jugular venous distention. No thyroid enlargement, no tenderness.  LUNGS: Normal breath sounds bilaterally, no wheezing, rales, rhonchi. No use of accessory muscles of respiration.  CARDIOVASCULAR: S1, S2 normal. No murmurs, rubs, or gallops.  ABDOMEN: Soft, nontender, nondistended. Bowel sounds present. No organomegaly or mass.  EXTREMITIES: No cyanosis, clubbing or edema b/l.   Tenderness b/l feet. NEUROLOGIC: grossly non focal. No focal Motor or sensory deficits b/l.   PSYCHIATRIC: The patient is alert and awake. Flat affect SKIN: No obvious rash, lesion, or ulcer.   LABORATORY PANEL:   CBC  Recent Labs Lab 06/25/17 0500  WBC 5.1  HGB 9.7*  HCT 28.9*  PLT 198   ------------------------------------------------------------------------------------------------------------------ Chemistries   Recent Labs Lab 06/27/17 0502 06/28/17 0411  NA 138  --   K 5.5* 5.1  CL 94*  --   CO2 31  --   GLUCOSE 256*  --   BUN 49*  --   CREATININE 11.91*  --   CALCIUM 9.5  --     ------------------------------------------------------------------------------------------------------------------  Cardiac Enzymes No results for input(s): TROPONINI in the last 168 hours. ------------------------------------------------------------------------------------------------------------------  RADIOLOGY:  No results found.   ASSESSMENT AND PLAN:   36 y.o. female with a history of Bipolar disorder with significant cognitive impairment, combative and aggressive behavior, CKD, depression, diabetes, hypertension, hyperlipidemia  now being admitted with:  # Hyperkalemia. Due to inadequate hemodialysis. Patient has refused hemodialysis on multiple occasions. k 5.1  # Multiple nondisplaced fractures of bilateral feet - Ortho consult appreciated. Boots ordered. Can bear weight. No surgery. Pain meds PRN. - PT is working with her regularly---walked today with PT with walker  # End-stage renal disease -Continue PhosLo, Sensipar - Nephro consult for HD. Appreciate help. - MWF schedule -non compliance with HD  # History of Bipolar -Continue Depakote, Lexapro, Seroquel, trazodone  # History of GERD - Continue Protonix  # History of hypertension - Continue Norvasc, clonidine, hydralazine, Avapro, metoprolol.  # History of  Diabetes SSI  Will keep pt over the weekned and d/c back to group home on Monday  All the records are reviewed and case discussed with Care Management/Social Worker Management plans discussed with the patient.  CODE STATUS: FULL CODE  DVT Prophylaxis: SCDs  TOTAL TIME TAKING CARE OF THIS PATIENT: 25 minutes.    Rylan Bernard M.D on 06/29/2017 at 8:06 AM  Between 7am to 6pm - Pager - [phone]  After 6pm go to [email or web address] - password [email or web address] [facility]  Office  [phone]  CC: Primary care physician; Remi Haggard, FNP  Note: This dictation was prepared with Dragon dictation along with  smaller phrase  technology. Any transcriptional errors that result from this process are unintentional.

## 2017-06-30 LAB — GLUCOSE, CAPILLARY
Glucose-Capillary: 341 mg/dL — ABNORMAL HIGH (ref 65–99)
Glucose-Capillary: 327 mg/dL — ABNORMAL HIGH (ref 65–99)

## 2017-06-30 NOTE — Progress Notes (Signed)
[place] at [place] NAME: Kelly [name]    MR#:  [id number]  DATE OF BIRTH:  [birth year]  SUBJECTIVE:  Sleeping. Per RN and refused all oral meds this morning. She took only insulin. Had dialysis done yesterday REVIEW OF SYSTEMS:    Review of Systems  Unable to perform ROS: Psychiatric disorder    DRUG ALLERGIES:  No Known Allergies  VITALS:  Blood pressure 107/72, pulse 81, temperature 98.7 F (37.1 C), temperature source Oral, resp. rate 19, height 5' (1.524 m), weight 92.4 kg (203 lb 11.2 oz), SpO2 100 %.  PHYSICAL EXAMINATION:   Physical Exam  GENERAL:  36 y.o.-year-old patient lying in the bed with no acute distress. obese EYES: Pupils equal, round, reactive to light and accommodation. No scleral icterus. Extraocular muscles intact.  HEENT: Head atraumatic, normocephalic. Oropharynx and nasopharynx clear.  NECK:  Supple, no jugular venous distention. No thyroid enlargement, no tenderness.  LUNGS: Normal breath sounds bilaterally, no wheezing, rales, rhonchi. No use of accessory muscles of respiration.  CARDIOVASCULAR: S1, S2 normal. No murmurs, rubs, or gallops.  ABDOMEN: Soft, nontender, nondistended. Bowel sounds present. No organomegaly or mass.  EXTREMITIES: No cyanosis, clubbing or edema b/l.   Tenderness b/l feet. NEUROLOGIC: grossly non focal. No focal Motor or sensory deficits b/l.   PSYCHIATRIC: The patient is alert and awake. Flat affect SKIN: No obvious rash, lesion, or ulcer.   LABORATORY PANEL:   CBC  Recent Labs Lab 06/29/17 1600  WBC 4.8  HGB 9.2*  HCT 27.6*  PLT 200   ------------------------------------------------------------------------------------------------------------------ Chemistries   Recent Labs Lab 06/29/17 1600  NA 136  K 5.1  CL 100*  CO2 25  GLUCOSE 157*  BUN 58*  CREATININE 11.96*  CALCIUM 9.1    ------------------------------------------------------------------------------------------------------------------  Cardiac Enzymes No results for input(s): TROPONINI in the last 168 hours. ------------------------------------------------------------------------------------------------------------------  RADIOLOGY:  No results found.   ASSESSMENT AND PLAN:   36 y.o. female with a history of Bipolar disorder with significant cognitive impairment, combative and aggressive behavior, CKD, depression, diabetes, hypertension, hyperlipidemia  now being admitted with:  # Hyperkalemia. Due to inadequate hemodialysis. Patient has refused hemodialysis on multiple occasions. k 5.1  # Multiple nondisplaced fractures of bilateral feet - Ortho consult appreciated. Boots ordered. Can bear weight. No surgery. Pain meds PRN. - PT is working with her regularly---walked today with PT with walker  # End-stage renal disease -Continue PhosLo, Sensipar - Nephro consult for HD. Appreciate help. - MWF schedule -non compliance with HD  # History of Bipolar -Continue Depakote, Lexapro, Seroquel, trazodone  # History of GERD - Continue Protonix  # History of hypertension - Continue Norvasc, clonidine, hydralazine, Avapro, metoprolol.  # History of  Diabetes SSI  Will keep pt over the weekned and d/c back to group home on Monday  All the records are reviewed and case discussed with Care Management/Social Worker Management plans discussed with the patient.  CODE STATUS: FULL CODE  DVT Prophylaxis: SCDs  TOTAL TIME TAKING CARE OF THIS PATIENT: 25 minutes.    Alarik Radu M.D on 06/30/2017 at 12:04 PM  Between 7am to 6pm - Pager - [phone]  After 6pm go to [email or web address] - password [email or web address]  Office  [phone]  CC: Primary care physician; Remi Haggard, FNP  Note: This dictation was prepared with Dragon dictation along with smaller phrase  technology. Any transcriptional errors that result from this process are unintentional.

## 2017-06-30 NOTE — Progress Notes (Signed)
Patient refusing vital signs this am

## 2017-06-30 NOTE — Clinical Social Work Note (Signed)
CSW received verbal consult that the patient will discharge tomorrow to her group home. CSW will follow to facilitate discharge at that time.  Santiago Bumpers, MSW, Latanya Presser [phone]

## 2017-06-30 NOTE — Progress Notes (Signed)
Patient refusing medications this am, Attempted 3 times to administer medications, Patient refused all times. Was able to at least give sliding scale insulin. Continue to monitor.

## 2017-06-30 NOTE — Progress Notes (Signed)
Patient refused for aide to check glucose level. MD aware.

## 2017-06-30 NOTE — Progress Notes (Signed)
Patient refusing this nurse to adjust leads on tele box. Will reattempt later.

## 2017-06-30 NOTE — Progress Notes (Signed)
Patient asleep in bed, no signs of distress. Continue to monitor.

## 2017-07-01 LAB — RENAL FUNCTION PANEL
ALBUMIN: 3.2 g/dL — AB (ref 3.5–5.0)
ANION GAP: 14 (ref 5–15)
BUN: 49 mg/dL — AB (ref 6–20)
CHLORIDE: 94 mmol/L — AB (ref 101–111)
CO2: 28 mmol/L (ref 22–32)
Calcium: 9.4 mg/dL (ref 8.9–10.3)
Creatinine, Ser: 9.61 mg/dL — ABNORMAL HIGH (ref 0.44–1.00)
GFR calc Af Amer: 5 mL/min — ABNORMAL LOW (ref >60)
GFR calc non Af Amer: 5 mL/min — ABNORMAL LOW (ref >60)
GLUCOSE: 379 mg/dL — AB (ref 65–99)
POTASSIUM: 5.5 mmol/L — AB (ref 3.5–5.1)
Phosphorus: 3.3 mg/dL (ref 2.5–4.6)
Sodium: 136 mmol/L (ref 135–145)

## 2017-07-01 LAB — GLUCOSE, CAPILLARY
GLUCOSE-CAPILLARY: 219 mg/dL — AB (ref 65–99)
Glucose-Capillary: 270 mg/dL — ABNORMAL HIGH (ref 65–99)
Glucose-Capillary: 296 mg/dL — ABNORMAL HIGH (ref 65–99)

## 2017-07-01 MED ORDER — OXYCODONE HCL 5 MG PO TABS: 5.0000 mg | ORAL_TABLET | Freq: Four times a day (QID) | ORAL | 0 refills | Status: DC | PRN

## 2017-07-01 NOTE — Progress Notes (Signed)
Pt refusing vital signs to be taken and due midnight medicine.

## 2017-07-01 NOTE — Progress Notes (Signed)
Inpatient Diabetes Program Recommendations  AACE/ADA: New Consensus Statement on Inpatient Glycemic Control (2015)  Target Ranges:  Prepandial:   less than 140 mg/dL      Peak postprandial:   less than 180 mg/dL (1-2 hours)      Critically ill patients:  140 - 180 mg/dL   Results for Kelly [name], Kelly [name] (MRN [id number]) as of 07/01/2017 09:25  Ref. Range 06/30/2017 07:20 06/30/2017 20:42 07/01/2017 07:46  Glucose-Capillary Latest Ref Range: 65 - 99 mg/dL 341 (H) 327 (H) 270 (H)   Review of Glycemic Control  Diabetes history: DM2 Outpatient Diabetes medications: Novolog 0-12 units TID with meals Current orders for Inpatient glycemic control: Novolog 0-15 units TID with meals, Novolog 0-5 units QHS, Novolog 2 units TID with meals  Inpatient Diabetes Program Recommendations: Insulin - Basal: Please consider ordering Lantus 9 units Q24H (based on 92 kg x 0.1 units). Insulin-Meal Coverage: Please consider increasing meal coverage to Novolog 4 units TID with meals.   NOTE: Per chart review, noted patient frequently refusing to have vital signs and CBGs checked and refusing insulin at times.  Thanks, Barnie Alderman, RN, MSN, CDE Diabetes Coordinator Inpatient Diabetes Program [phone] (Team Pager from 8am to 5pm)

## 2017-07-01 NOTE — Progress Notes (Signed)
PT Cancellation Note  Patient Details Name: Kelly [name] MRN: [id number] DOB: 1981/02/22   Cancelled Treatment:    Reason Eval/Treat Not Completed: Other (comment) Pt taken to dialysis and is not currently available for treatment. Will re-attempt time permitting.     Bevelyn Ngo 07/01/2017, 10:50 AM

## 2017-07-01 NOTE — Care Management (Signed)
Will not qualify for home PT due to no qualify dx under Medicaid. CSW updated.

## 2017-07-01 NOTE — Progress Notes (Signed)
HD initiated via L AVG without issue. Patient resting this am. Discussed the importance of keeping the access arm still for treatment d/t patient frequently rolling from side to side in bed. Additional tape applied to secure access. Patient also continues to cover access arm and face. Discussed the importance of keeping access arm and face visibile to RN at all times for safety. Continue to monitor closely

## 2017-07-01 NOTE — Progress Notes (Signed)
Post HD assessment unchanged  

## 2017-07-01 NOTE — Progress Notes (Signed)
Pre HD  

## 2017-07-01 NOTE — Discharge Summary (Signed)
[facility] NAME: Kelly [name]    MR#:  [id number]  DATE OF BIRTH:  08/09/1981  DATE OF ADMISSION:  06/18/2017 ADMITTING PHYSICIAN: Harvie Bridge, DO  DATE OF DISCHARGE: 07/01/2017  PRIMARY CARE PHYSICIAN: Remi Haggard, FNP    ADMISSION DIAGNOSIS:  Closed nondisplaced fracture of fourth metatarsal bone of left foot, initial encounter [S92.345A] Closed nondisplaced fracture of third metatarsal bone of left foot, initial encounter [S92.335A] Closed nondisplaced fracture of third metatarsal bone of right foot, initial encounter [S92.334A] Closed nondisplaced fracture of fourth metatarsal bone of right foot, initial encounter [S92.344A] Closed nondisplaced fracture of second metatarsal bone of left foot, initial encounter [S92.325A] Closed nondisplaced fracture of second metatarsal bone of right foot, initial encounter [S92.324A] Closed nondisplaced fracture of first metatarsal bone of left foot, initial encounter [S92.315A] Closed fracture of distal end of right fibula, unspecified fracture morphology, initial encounter [K27.062B] Closed nondisplaced fracture of first metatarsal bone of right foot, initial encounter [S92.314A]  DISCHARGE DIAGNOSIS:  Multiple nondisplaced fractures of bilateral feet Hyperkalemia improved End-stage renal disease on hemodialysis Severe bipolar disorder  SECONDARY DIAGNOSIS:   Past Medical History:  Diagnosis Date  . Alcohol abuse    chronic  . Bipolar disorder ([place])   . Chronic kidney disease   . Cognitive changes   . Cognitive impairment   . Depression   . Depression   . Diabetes mellitus without complication ([place])   . Elevated lipids   . Hyperparathyroidism ([place])   . Hypertension   . Staph aureus infection    A/V fistula  . UTI (lower urinary tract infection)     HOSPITAL COURSE:   36 y.o.femalewith a history of Bipolar disorder with significant cognitive  impairment, combative and aggressive behavior, CKD, depression, diabetes, hypertension, hyperlipidemianow being admitted with:  # Hyperkalemia - Due to inadequate hemodialysis. Patient has refused hemodialysis on multiple occasions. k 5.1--stable -no EKG changes  # Multiple nondisplaced fractures of bilateral feet - Ortho consult appreciated. Boots ordered. Can bear weight. No surgery. Pain meds PRN. - PT is working with her regularly---walked today with PT with walker  # End-stage renal disease -Continue PhosLo, Sensipar - Nephro consult for HD. Appreciate help. - MWF schedule -intermittent non compliance with HD  # History of Bipolar -Continue Depakote, Lexapro, Seroquel, trazodone  # History of GERD - Continue Protonix  # History of hypertension - Continue Norvasc, clonidine, hydralazine, Avapro, metoprolol.  # History of Diabetes SSI   d/c back to group home today CONSULTS OBTAINED:  Treatment Team:  [name], MD Poggi, Marshall Cork, MD  DRUG ALLERGIES:  No Known Allergies  DISCHARGE MEDICATIONS:   Current Discharge Medication List    START taking these medications   Details  oxyCODONE (OXY IR/ROXICODONE) 5 MG immediate release tablet Take 1 tablet (5 mg total) by mouth every 6 (six) hours as needed for moderate pain. Qty: 15 tablet, Refills: 0      CONTINUE these medications which have NOT CHANGED   Details  acetaminophen (TYLENOL) 325 MG tablet Take 2 tablets (650 mg total) by mouth every 6 (six) hours as needed for mild pain, moderate pain or headache. Qty: 30 tablet, Refills: 0    amLODipine (NORVASC) 10 MG tablet Take 10 mg by mouth at bedtime. *hold for systolic blood pressure <762, call MD if holding this medication.*    aspirin 81 MG chewable tablet Chew 1 tablet (81 mg total) by mouth daily. Qty: 30  tablet, Refills: 0    B Complex-C-Folic Acid (RENA-VITE RX) 1 MG TABS Take 1 tablet by mouth daily. Qty: 30 tablet, Refills: 0     bacitracin ointment Apply to affected area twice daily Qty: 30 g, Refills: 0    calcium acetate (PHOSLO) 667 MG capsule Take 2 capsules (1,334 mg total) by mouth 2 (two) times daily with a meal. Qty: 60 capsule, Refills: 0    cinacalcet (SENSIPAR) 60 MG tablet Take 1 tablet (60 mg total) by mouth daily. Qty: 60 tablet, Refills: 0    cloNIDine (CATAPRES) 0.1 MG tablet Take 1 tablet (0.1 mg total) by mouth 2 (two) times daily. Qty: 60 tablet, Refills: 0    divalproex (DEPAKOTE) 500 MG DR tablet Take 2 tablets (1,000 mg total) by mouth at bedtime. Qty: 60 tablet, Refills: 0    docusate sodium (COLACE) 100 MG capsule Take 1 capsule (100 mg total) by mouth at bedtime. Qty: 30 capsule, Refills: 0    escitalopram (LEXAPRO) 10 MG tablet Take 1.5 tablets (15 mg total) by mouth daily. Qty: 45 tablet, Refills: 1    folic acid (FOLVITE) 1 MG tablet Take 2 tablets (2 mg total) by mouth at bedtime. Qty: 30 tablet, Refills: 0    hydrALAZINE (APRESOLINE) 25 MG tablet Take 1 tablet (25 mg total) by mouth 3 (three) times daily. Qty: 90 tablet, Refills: 0    insulin aspart (NOVOLOG) 100 UNIT/ML injection Inject 0-12 Units into the skin 3 (three) times daily before meals. Per sliding scale    irbesartan (AVAPRO) 300 MG tablet Take 1 tablet (300 mg total) by mouth at bedtime. Qty: 30 tablet, Refills: 0    lidocaine-prilocaine (EMLA) cream Apply 1 application topically 3 (three) times a week. Apply to access site 30 minutes before dialysis. Qty: 30 g, Refills: 0    medroxyPROGESTERone (DEPO-PROVERA) 150 MG/ML injection Inject 150 mg into the muscle every 3 (three) months.    metoprolol (LOPRESSOR) 50 MG tablet Take 1 tablet (50 mg total) by mouth 2 (two) times daily. Qty: 60 tablet, Refills: 1    Multiple Vitamin (THEREMS) TABS Take 1 tablet by mouth daily. Qty: 30 tablet, Refills: 0    pantoprazole (PROTONIX) 20 MG tablet Take 1 tablet (20 mg total) by mouth daily. Qty: 30 tablet, Refills: 0     patiromer (VELTASSA) 8.4 g packet Take 1 packet (8.4 g total) by mouth daily. Qty: 30 packet, Refills: 0    QUEtiapine (SEROQUEL) 200 MG tablet Take 1 tablet (200 mg total) by mouth 2 (two) times daily. Qty: 60 tablet, Refills: 0    traZODone (DESYREL) 100 MG tablet Take 1 tablet (100 mg total) by mouth at bedtime. Qty: 30 tablet, Refills: 0    vitamin B-12 (CYANOCOBALAMIN) 1000 MCG tablet Take 1 tablet (1,000 mcg total) by mouth at bedtime. Qty: 30 tablet, Refills: 0      STOP taking these medications     sulfamethoxazole-trimethoprim (BACTRIM DS,SEPTRA DS) 800-160 MG tablet         If you experience worsening of your admission symptoms, develop shortness of breath, life threatening emergency, suicidal or homicidal thoughts you must seek medical attention immediately by calling 911 or calling your MD immediately  if symptoms less severe.  You Must read complete instructions/literature along with all the possible adverse reactions/side effects for all the Medicines you take and that have been prescribed to you. Take any new Medicines after you have completely understood and accept all the possible adverse reactions/side effects.  Please note  You were cared for by a hospitalist during your hospital stay. If you have any questions about your discharge medications or the care you received while you were in the hospital after you are discharged, you can call the unit and asked to speak with the hospitalist on call if the hospitalist that took care of you is not available. Once you are discharged, your primary care physician will handle any further medical issues. Please note that NO REFILLS for any discharge medications will be authorized once you are discharged, as it is imperative that you return to your primary care physician (or establish a relationship with a primary care physician if you do not have one) for your aftercare needs so that they can reassess your need for medications  and monitor your lab values. Today   SUBJECTIVE   Not in a good mood this am per RN---settled some in when I went to see her  VITAL SIGNS:  Blood pressure 95/80, pulse 87, temperature 98.2 F (36.8 C), temperature source Oral, resp. rate 16, height 5' (1.524 m), weight 92.1 kg (203 lb 0.7 oz), SpO2 100 %.  I/O:   Intake/Output Summary (Last 24 hours) at 07/01/17 1016 Last data filed at 07/01/17 0900  Gross per 24 hour  Intake             1020 ml  Output                0 ml  Net             1020 ml    PHYSICAL EXAMINATION:  GENERAL:  36 y.o.-year-old patient lying in the bed with no acute distress. obese EYES: Pupils equal, round, reactive to light and accommodation. No scleral icterus. Extraocular muscles intact.  HEENT: Head atraumatic, normocephalic. Oropharynx and nasopharynx clear.  NECK:  Supple, no jugular venous distention. No thyroid enlargement, no tenderness.  LUNGS: Normal breath sounds bilaterally, no wheezing, rales,rhonchi or crepitation. No use of accessory muscles of respiration.  CARDIOVASCULAR: S1, S2 normal. No murmurs, rubs, or gallops.  ABDOMEN: Soft, non-tender, non-distended. Bowel sounds present. No organomegaly or mass.  EXTREMITIES: No pedal edema, cyanosis, or clubbing.  NEUROLOGIC: Cranial nerves II through XII are intact. Muscle strength 5/5 in all extremities. Sensation intact. Gait not checked.  PSYCHIATRIC:  patient is alert SKIN: No obvious rash, lesion, or ulcer.   DATA REVIEW:   CBC   Recent Labs Lab 06/29/17 1600  WBC 4.8  HGB 9.2*  HCT 27.6*  PLT 200    Chemistries   Recent Labs Lab 06/29/17 1600  NA 136  K 5.1  CL 100*  CO2 25  GLUCOSE 157*  BUN 58*  CREATININE 11.96*  CALCIUM 9.1    Microbiology Results   No results found for this or any previous visit (from the past 240 hour(s)).  RADIOLOGY:  No results found.   Management plans discussed with the patient, family and they are in agreement.  CODE STATUS:      Code Status Orders        Start     Ordered   06/19/17 0025  Full code  Continuous     06/19/17 0024    Code Status History    Date Active Date Inactive Code Status Order ID Comments User Context   06/26/2016  3:44 PM 06/26/2016  5:58 PM DNR [id number]  Katha Cabal, MD Inpatient   06/25/2016  7:51 AM 06/25/2016  7:59 AM Full Code [id number]  Lytle Butte, MD ED   04/22/2015  6:22 PM 04/24/2015 12:02 AM Full Code [id number]  Schnier, Dolores Lory, MD Inpatient      TOTAL TIME TAKING CARE OF THIS PATIENT: 40 minutes.    Gaines Cartmell M.D on 07/01/2017 at 10:16 AM  Between 7am to 6pm - Pager - [phone] After 6pm go to [email or web address] - password [email or web address] [facility]  Office  [phone]  CC: Primary care physician; Remi Haggard, FNP

## 2017-07-01 NOTE — NC FL2 (Signed)
[place] LEVEL OF CARE SCREENING TOOL     IDENTIFICATION  Patient Name: Kelly [name] Birthdate: January 13, 1981 Sex: female Admission Date (Current Location): 06/18/2017  [place] and [place] Number:  [name]  ([id number] [facility]) Facility and Address:  [facility], [address] [place], [place] [address]      Provider Number: [id number]  Attending Physician Name and Address:  Fritzi Mandes, MD  Relative Name and Phone Number:       Current Level of Care: Hospital Recommended Level of Care: [facility] Prior Approval Number:    Date Approved/Denied:   PASRR Number:  [id number])  Discharge Plan: Domiciliary (Rest home)    Current Diagnoses: Patient Active Problem List   Diagnosis Date Noted  . Multiple fractures of both lower extremities 06/18/2017  . 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 Hyperkalemia 06/25/2016  . Adjustment disorder with mixed disturbance of emotions and conduct   . Diabetes ([place]) 04/23/2015  . Essential hypertension 04/23/2015  . ESRD on dialysis ([place]) 04/23/2015  . Pain of left arm 04/22/2015    Orientation RESPIRATION BLADDER Height & Weight     Self, Time, Situation, Place  Normal Continent Weight: 203 lb 0.7 oz (92.1 kg) Height:  5' (152.4 cm)  BEHAVIORAL SYMPTOMS/MOOD NEUROLOGICAL BOWEL NUTRITION STATUS      Continent Diet: Low Sodium/ Heart Healthy   AMBULATORY STATUS COMMUNICATION OF NEEDS Skin   Limited Assist Verbally Normal                       Personal Care Assistance Level of Assistance  Bathing, Feeding, Dressing Bathing Assistance: Limited assistance Feeding assistance: Independent Dressing Assistance: Limited assistance     Functional Limitations Info  Sight, Hearing, Speech Sight Info: Adequate Hearing Info: Adequate Speech Info: Adequate    SPECIAL CARE FACTORS FREQUENCY  Dialysis                    Contractures      Additional Factors Info  Code  Status, Allergies Code Status Info:  (Full Code. ) Allergies Info:  (No Known Allergies. )          Discharge Medications: Please see discharge summary for a list of discharge medications. Current Discharge Medication List        START taking these medications   Details  oxyCODONE (OXY IR/ROXICODONE) 5 MG immediate release tablet Take 1 tablet (5 mg total) by mouth every 6 (six) hours as needed for moderate pain. Qty: 15 tablet, Refills: 0          CONTINUE these medications which have NOT CHANGED   Details  acetaminophen (TYLENOL) 325 MG tablet Take 2 tablets (650 mg total) by mouth every 6 (six) hours as needed for mild pain, moderate pain or headache. Qty: 30 tablet, Refills: 0    amLODipine (NORVASC) 10 MG tablet Take 10 mg by mouth at bedtime. *hold for systolic blood pressure <440, call MD if holding this medication.*    aspirin 81 MG chewable tablet Chew 1 tablet (81 mg total) by mouth daily. Qty: 30 tablet, Refills: 0    B Complex-C-Folic Acid (RENA-VITE RX) 1 MG TABS Take 1 tablet by mouth daily. Qty: 30 tablet, Refills: 0    bacitracin ointment Apply to affected area twice daily Qty: 30 g, Refills: 0    calcium acetate (PHOSLO) 667 MG capsule Take 2 capsules (1,334 mg total) by mouth 2 (two) times daily with a meal. Qty:  60 capsule, Refills: 0    cinacalcet (SENSIPAR) 60 MG tablet Take 1 tablet (60 mg total) by mouth daily. Qty: 60 tablet, Refills: 0    cloNIDine (CATAPRES) 0.1 MG tablet Take 1 tablet (0.1 mg total) by mouth 2 (two) times daily. Qty: 60 tablet, Refills: 0    divalproex (DEPAKOTE) 500 MG DR tablet Take 2 tablets (1,000 mg total) by mouth at bedtime. Qty: 60 tablet, Refills: 0    docusate sodium (COLACE) 100 MG capsule Take 1 capsule (100 mg total) by mouth at bedtime. Qty: 30 capsule, Refills: 0    escitalopram (LEXAPRO) 10 MG tablet Take 1.5 tablets (15 mg total) by mouth daily. Qty: 45 tablet, Refills: 1    folic acid  (FOLVITE) 1 MG tablet Take 2 tablets (2 mg total) by mouth at bedtime. Qty: 30 tablet, Refills: 0    hydrALAZINE (APRESOLINE) 25 MG tablet Take 1 tablet (25 mg total) by mouth 3 (three) times daily. Qty: 90 tablet, Refills: 0    insulin aspart (NOVOLOG) 100 UNIT/ML injection Inject 0-12 Units into the skin 3 (three) times daily before meals. Per sliding scale    irbesartan (AVAPRO) 300 MG tablet Take 1 tablet (300 mg total) by mouth at bedtime. Qty: 30 tablet, Refills: 0    lidocaine-prilocaine (EMLA) cream Apply 1 application topically 3 (three) times a week. Apply to access site 30 minutes before dialysis. Qty: 30 g, Refills: 0    medroxyPROGESTERone (DEPO-PROVERA) 150 MG/ML injection Inject 150 mg into the muscle every 3 (three) months.    metoprolol (LOPRESSOR) 50 MG tablet Take 1 tablet (50 mg total) by mouth 2 (two) times daily. Qty: 60 tablet, Refills: 1    Multiple Vitamin (THEREMS) TABS Take 1 tablet by mouth daily. Qty: 30 tablet, Refills: 0    pantoprazole (PROTONIX) 20 MG tablet Take 1 tablet (20 mg total) by mouth daily. Qty: 30 tablet, Refills: 0    patiromer (VELTASSA) 8.4 g packet Take 1 packet (8.4 g total) by mouth daily. Qty: 30 packet, Refills: 0    QUEtiapine (SEROQUEL) 200 MG tablet Take 1 tablet (200 mg total) by mouth 2 (two) times daily. Qty: 60 tablet, Refills: 0    traZODone (DESYREL) 100 MG tablet Take 1 tablet (100 mg total) by mouth at bedtime. Qty: 30 tablet, Refills: 0    vitamin B-12 (CYANOCOBALAMIN) 1000 MCG tablet Take 1 tablet (1,000 mcg total) by mouth at bedtime. Qty: 30 tablet, Refills: 0         STOP taking these medications     sulfamethoxazole-trimethoprim (BACTRIM DS,SEPTRA DS) 800-160 MG tablet     Relevant Imaging Results: Relevant Lab Results: Additional Information  (SSN: [id number])  Royalty Fakhouri, Veronia Beets, LCSW

## 2017-07-01 NOTE — Progress Notes (Signed)
Mr. Kelly [name] notified that pt is ready for discharge, he is requesting a walker. Nurse notified social worker, per social worker that can be ordered, he is now requesting a hospital bed and a wheelchair. Nurse checked with social worker, that cannot be ordered, notified him that a walker is the only thing that can be ordered, he stated "ok that is fine"

## 2017-07-01 NOTE — Progress Notes (Signed)
Walker sent with pt

## 2017-07-01 NOTE — Progress Notes (Signed)
Hemodialysis stopped. Patient signed off treatment AMA with 1 hour and 20 minutes remaining. Dr. Holley Raring notified. Patient states "i just want to go back to my room."

## 2017-07-01 NOTE — Progress Notes (Signed)
Pre hd assessment. Patient denies complaints.

## 2017-07-01 NOTE — Progress Notes (Signed)
Mr. Kelly [name] notified that pt is ready.

## 2017-07-01 NOTE — Progress Notes (Signed)
[facility]  ROUNDING NOTE   Subjective:  Patient seen and evaluated during hemodialysis. She appears to be tolerating this well. It appears that the patient will be most likely discharge after dialysis today.  Objective:  Vital signs in last 24 hours:  Temp:  [98.2 F (36.8 C)-98.4 F (36.9 C)] 98.2 F (36.8 C) (08/06 0925) Pulse Rate:  [78-87] 87 (08/06 1000) Resp:  [15-16] 16 (08/06 1000) BP: (95-143)/(62-80) 95/80 (08/06 1000) SpO2:  [100 %] 100 % (08/06 1000) Weight:  [92.1 kg (203 lb 0.7 oz)] 92.1 kg (203 lb 0.7 oz) (08/06 0925)  Weight change:  Filed Weights   06/29/17 1550 06/29/17 1956 07/01/17 0925  Weight: 92 kg (202 lb 13.2 oz) 92.4 kg (203 lb 11.2 oz) 92.1 kg (203 lb 0.7 oz)    Intake/Output: I/O last 3 completed shifts: In: 1200 [P.O.:1200] Out: 0    Intake/Output this shift:  Total I/O In: 60 [P.O.:60] Out: -   Physical Exam: General: NAD, tolerating HD well.  Head: Normocephalic, atraumatic. Moist oral mucosal membranes  Eyes: Anicteric  Neck: Supple  Lungs:  Clear to auscultation  Heart: Regular rate and rhythm  Abdomen:  Soft, nontender  Extremities: trace peripheral edema, foot braces -   Neurologic: Resting quietly  Skin: No lesions  Access: Left upper arm AVF    Basic Metabolic Panel:  Recent Labs Lab 06/24/17 1130  06/25/17 1108 06/26/17 1632 06/27/17 0502 06/28/17 0411 06/29/17 1600  NA 133*  --  140  --  138  --  136  K 6.8*  < > 6.1* 5.1 5.5* 5.1 5.1  CL 93*  --  97*  --  94*  --  100*  CO2 26  --  31  --  31  --  25  GLUCOSE 202*  --  158*  --  256*  --  157*  BUN 83*  --  54*  --  49*  --  58*  CREATININE 15.01*  --  11.57*  --  11.91*  --  11.96*  CALCIUM 9.5  --  10.0  --  9.5  --  9.1  PHOS 3.1  --  3.3  --  3.4  --  3.3  < > = values in this interval not displayed.  Liver Function Tests:  Recent Labs Lab 06/24/17 1130 06/25/17 1108 06/27/17 0502 06/29/17 1600  ALBUMIN 3.3* 3.1* 3.1* 3.0*   No  results for input(s): LIPASE, AMYLASE in the last 168 hours. No results for input(s): AMMONIA in the last 168 hours.  CBC:  Recent Labs Lab 06/24/17 1130 06/25/17 0500 06/29/17 1600  WBC 4.0 5.1 4.8  HGB 9.7* 9.7* 9.2*  HCT 29.3* 28.9* 27.6*  MCV 101.9* 101.4* 99.9  PLT 205 198 200    Cardiac Enzymes: No results for input(s): CKTOTAL, CKMB, CKMBINDEX, TROPONINI in the last 168 hours.  BNP: Invalid input(s): POCBNP  CBG:  Recent Labs Lab 06/29/17 1135 06/29/17 2104 06/30/17 0720 06/30/17 2042 07/01/17 0746  GLUCAP 250* 209* 341* 327* 270*    Microbiology: Results for orders placed or performed during the hospital encounter of 06/18/17  MRSA PCR Screening     Status: None   Collection Time: 06/19/17 10:16 PM  Result Value Ref Range Status   MRSA by PCR NEGATIVE NEGATIVE Final    Comment:        The GeneXpert MRSA Assay (FDA approved for NASAL specimens only), is one component of a comprehensive MRSA colonization surveillance program. It  is not intended to diagnose MRSA infection nor to guide or monitor treatment for MRSA infections.     Coagulation Studies: No results for input(s): LABPROT, INR in the last 72 hours.  Urinalysis: No results for input(s): COLORURINE, LABSPEC, PHURINE, GLUCOSEU, HGBUR, BILIRUBINUR, KETONESUR, PROTEINUR, UROBILINOGEN, NITRITE, LEUKOCYTESUR in the last 72 hours.  Invalid input(s): APPERANCEUR    Imaging: No results found.   Medications:    . amLODipine  10 mg Oral QPM  . aspirin  81 mg Oral Daily  . calcium acetate  1,334 mg Oral BID WC  . cinacalcet  60 mg Oral Daily  . cloNIDine  0.1 mg Oral TID  . divalproex  1,000 mg Oral QHS  . docusate sodium  100 mg Oral QHS  . epoetin (EPOGEN/PROCRIT) injection  4,000 Units Intravenous Q M,W,F-HD  . escitalopram  15 mg Oral Daily  . folic acid  2 mg Oral QHS  . heparin  5,000 Units Subcutaneous Q8H  . hydrALAZINE  25 mg Oral TID  . insulin aspart  0-15 Units  Subcutaneous TID WC  . insulin aspart  0-5 Units Subcutaneous QHS  . insulin aspart  2 Units Subcutaneous TID WC  . metoprolol tartrate  50 mg Oral BID  . multivitamin  1 tablet Oral Daily  . pantoprazole  40 mg Oral Daily  . patiromer  8.4 g Oral Q24H  . QUEtiapine  200 mg Oral BID  . traZODone  100 mg Oral QHS  . vitamin B-12  1,000 mcg Oral QHS   acetaminophen **OR** acetaminophen, albuterol, bisacodyl, ipratropium, magnesium citrate, ondansetron **OR** ondansetron (ZOFRAN) IV, oxyCODONE, senna-docusate  Assessment/ Plan:  [name] is a 36 y.o. black female  with developmental delay, ESRD on hemodialysis, anemia, hypertension, schizophrenia, diabetes mellitus type II  Admitted 06/18/2017 for bilateral metatarsal fractures and right fibular fracture.  No surgical intervention warranted. Dr. Roland Rack evaluated patient on 7/25.   [place]  1. End Stage Renal Disease: on hemodialysis MWF schedule.   - Patient seen and evaluated during hemodialysis today. She appears to be tolerating well. She will resume normal outpatient dialysis on Wednesday.  2. Hypertension: Continue amlodipine, clonidine, hydralazine, and metoprolol.  3. Anemia of chronic kidney disease: hemoglobin 9.7 - Continue Epogen as an outpatient.    4. Secondary Hyperparathyroidism:  Continue to monitor bone mineral metabolism parameters as an outpatient and continue Sensipar as well as calcium acetate.  5. Hyperkalemia - Maintain the patient on veltassa and continue to monitor serum potassium as an outpatient      LOS: 13 Kelly [name] 8/6/201810:36 AM

## 2017-07-01 NOTE — Progress Notes (Signed)
Patient is medically stable for D/C back to [facility] in [place] today. Per Mr. [place] group home owner he will pick patient up after dialysis today. RN has agreed to call [name] when patient has returned from dialysis. Clinical Education officer, museum (CSW) sent D/Csummary, FL2 and new prescription to [name]. CSW contacted Debbie at [facility] on [address] in [place] and made her aware of above and sent her D/C summary. CSW contacted patient's guardian Kelly [name] and left her a voicemail making her aware of above. On Friday 06/28/17 Kelly [name] was in agreement with patient returning to the group home. Please reconsult if future social work needs arise. CSW signing off.   [employer], LCSW [phone]

## 2017-07-01 NOTE — Progress Notes (Signed)
Discharge Note:  Mr Kelly [name] here to pick up pt,discharge instructions given and packet handed to him. Pt wheeled to car by staff.

## 2017-07-01 NOTE — Discharge Instructions (Signed)
Patient to resume her hemodialysis Monday Wednesday Friday for her routine as outpatient

## 2017-07-01 NOTE — Progress Notes (Signed)
Pt refusing vitals signs and scheduled medicines, including insulin. Pts IV hanging out, MD Posey Pronto notified, ( per MD IV can stay out.). No new orders at this time.

## 2017-07-15 ENCOUNTER — Encounter (HOSPITAL_COMMUNITY): Payer: Self-pay | Admitting: *Deleted

## 2017-07-15 ENCOUNTER — Ambulatory Visit (HOSPITAL_COMMUNITY)
Admission: EM | Admit: 2017-07-15 | Discharge: 2017-07-15 | Disposition: A | Discharge status: Home / Self Care | Payer: Medicaid Other | Attending: Family Medicine | Admitting: Family Medicine

## 2017-07-15 DIAGNOSIS — M25562 Pain in left knee: Secondary | ICD-10-CM

## 2017-07-15 DIAGNOSIS — W19XXXA Unspecified fall, initial encounter: Secondary | ICD-10-CM

## 2017-07-15 NOTE — ED Triage Notes (Signed)
Patient reports falling today while standing outside smoking. Patient with bilateral cam walker boots on. Patient with impaired balance due to these. Patient reports left knee pain.

## 2017-07-15 NOTE — ED Provider Notes (Signed)
[facility]    CSN: [id number] Arrival date & time: 07/15/17  1754     History   Chief Complaint Chief Complaint  Patient presents with  . Fall  . Knee Pain    HPI Gwen Sarvis is a 36 y.o. female.   36 year old female who has severe cognitive impairment, bipolar disorder, chronic kidney disease on dialysis comes in with legal guardian for falling today. Patient with bilateral cam walker due to recent metatarsal fractures. She also had dialysis today. Patient states due to cam walker, she lost her balance and fell. She admits to hitting left side of her head. Denies loss of consciousness. Denies weakness, dizziness that caused the fall. She also expresses some left knee pain. But is able to ambulate without a problem. Legal guardian states patient has not been acting differently, lethargic. Denies nausea, vomiting, photophobia. Legal guardian states came in due to patient complaining of headache. Patient currently denies headache.      Past Medical History:  Diagnosis Date  . Alcohol abuse    chronic  . Bipolar disorder ([place])   . Chronic kidney disease   . Cognitive changes   . Cognitive impairment   . Depression   . Depression   . Diabetes mellitus without complication ([place])   . Elevated lipids   . Hyperparathyroidism ([place])   . Hypertension   . Staph aureus infection    A/V fistula  . UTI (lower urinary tract infection)     Patient Active Problem List   Diagnosis Date Noted  . Multiple fractures of both lower extremities 06/18/2017  . 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 Hyperkalemia 06/25/2016  . Adjustment disorder with mixed disturbance of emotions and conduct   . Diabetes ([place]) 04/23/2015  . Essential hypertension 04/23/2015  . ESRD on dialysis ([place]) 04/23/2015  . Pain of left arm 04/22/2015    Past Surgical History:  Procedure Laterality Date  . AV FISTULA PLACEMENT    . CHOLECYSTECTOMY    . dialysis perma cath Right    chest    . PERIPHERAL VASCULAR CATHETERIZATION N/A 05/24/2015   Procedure: Dialysis/Perma Catheter Removal;  Surgeon: Katha Cabal, MD;  Location: [place] [facility];  Service: Cardiovascular;  Laterality: N/A;  . PERIPHERAL VASCULAR CATHETERIZATION Left 02/28/2016   Procedure: A/V Shuntogram/Fistulagram;  Surgeon: Katha Cabal, MD;  Location: [place] [facility];  Service: Cardiovascular;  Laterality: Left;  . PERIPHERAL VASCULAR CATHETERIZATION N/A 02/28/2016   Procedure: A/V Shunt Intervention;  Surgeon: Katha Cabal, MD;  Location: [place] [facility];  Service: Cardiovascular;  Laterality: N/A;  . PERIPHERAL VASCULAR CATHETERIZATION N/A 06/26/2016   Procedure: A/V Shuntogram/Fistulagram;  Surgeon: Katha Cabal, MD;  Location: [place] [facility];  Service: Cardiovascular;  Laterality: N/A;  . PERIPHERAL VASCULAR CATHETERIZATION N/A 09/10/2016   Procedure: A/V Shuntogram/Fistulagram;  Surgeon: Algernon Huxley, MD;  Location: [place] [facility];  Service: Cardiovascular;  Laterality: N/A;  . PERIPHERAL VASCULAR CATHETERIZATION Left 09/14/2016   Procedure: Thrombectomy;  Surgeon: Katha Cabal, MD;  Location: [place] [facility];  Service: Cardiovascular;  Laterality: Left;  Marland Kitchen VASCULAR ACCESS DEVICE INSERTION Left 04/22/2015   Procedure: INSERTION OF HERO VASCULAR ACCESS DEVICE;  Surgeon: Katha Cabal, MD;  Location: [facility];  Service: Vascular;  Laterality: Left;    OB History    No data available       Home Medications    Prior to Admission medications   Medication Sig Start Date  End Date Taking? Authorizing Provider  acetaminophen (TYLENOL) 325 MG tablet Take 2 tablets (650 mg total) by mouth every 6 (six) hours as needed for mild pain, moderate pain or headache. 10/01/16   Clapacs, Madie Reno, MD  amLODipine (NORVASC) 10 MG tablet Take 10 mg by mouth at bedtime. *hold for systolic blood pressure <270, call MD if holding this medication.*    [provider]   aspirin 81 MG chewable tablet Chew 1 tablet (81 mg total) by mouth daily. 10/02/16   Clapacs, Madie Reno, MD  B Complex-C-Folic Acid (RENA-VITE RX) 1 MG TABS Take 1 tablet by mouth daily. 10/01/16   Clapacs, Madie Reno, MD  bacitracin ointment Apply to affected area twice daily 11/27/16 11/27/17  Hagler, Jami L, PA-C  calcium acetate (PHOSLO) 667 MG capsule Take 2 capsules (1,334 mg total) by mouth 2 (two) times daily with a meal. 10/01/16   Clapacs, Madie Reno, MD  cinacalcet (SENSIPAR) 60 MG tablet Take 1 tablet (60 mg total) by mouth daily. 10/01/16   Clapacs, Madie Reno, MD  cloNIDine (CATAPRES) 0.1 MG tablet Take 1 tablet (0.1 mg total) by mouth 2 (two) times daily. 10/01/16   Clapacs, Madie Reno, MD  divalproex (DEPAKOTE) 500 MG DR tablet Take 2 tablets (1,000 mg total) by mouth at bedtime. 10/01/16   Clapacs, Madie Reno, MD  docusate sodium (COLACE) 100 MG capsule Take 1 capsule (100 mg total) by mouth at bedtime. 10/01/16   Clapacs, Madie Reno, MD  escitalopram (LEXAPRO) 10 MG tablet Take 1.5 tablets (15 mg total) by mouth daily. 10/02/16   Clapacs, Madie Reno, MD  folic acid (FOLVITE) 1 MG tablet Take 2 tablets (2 mg total) by mouth at bedtime. 10/01/16   Clapacs, Madie Reno, MD  hydrALAZINE (APRESOLINE) 25 MG tablet Take 1 tablet (25 mg total) by mouth 3 (three) times daily. 10/01/16   Clapacs, Madie Reno, MD  insulin aspart (NOVOLOG) 100 UNIT/ML injection Inject 0-12 Units into the skin 3 (three) times daily before meals. Per sliding scale    [provider]  irbesartan (AVAPRO) 300 MG tablet Take 1 tablet (300 mg total) by mouth at bedtime. 10/01/16   Clapacs, Madie Reno, MD  lidocaine-prilocaine (EMLA) cream Apply 1 application topically 3 (three) times a week. Apply to access site 30 minutes before dialysis. 10/01/16   Clapacs, Madie Reno, MD  medroxyPROGESTERone (DEPO-PROVERA) 150 MG/ML injection Inject 150 mg into the muscle every 3 (three) months.    [provider]  metoprolol (LOPRESSOR) 50 MG tablet Take 1 tablet (50 mg  total) by mouth 2 (two) times daily. 10/01/16   Clapacs, Madie Reno, MD  Multiple Vitamin (THEREMS) TABS Take 1 tablet by mouth daily. 10/01/16   Clapacs, Madie Reno, MD  oxyCODONE (OXY IR/ROXICODONE) 5 MG immediate release tablet Take 1 tablet (5 mg total) by mouth every 6 (six) hours as needed for moderate pain. 07/01/17   Fritzi Mandes, MD  pantoprazole (PROTONIX) 20 MG tablet Take 1 tablet (20 mg total) by mouth daily. 04/20/17   Shary Decamp, PA-C  patiromer (VELTASSA) 8.4 g packet Take 1 packet (8.4 g total) by mouth daily. 10/01/16   Clapacs, Madie Reno, MD  QUEtiapine (SEROQUEL) 200 MG tablet Take 1 tablet (200 mg total) by mouth 2 (two) times daily. 10/01/16   Clapacs, Madie Reno, MD  traZODone (DESYREL) 100 MG tablet Take 1 tablet (100 mg total) by mouth at bedtime. 10/01/16   Clapacs, Madie Reno, MD  vitamin B-12 (CYANOCOBALAMIN) 1000 MCG  tablet Take 1 tablet (1,000 mcg total) by mouth at bedtime. 10/01/16   Clapacs, Madie Reno, MD    Family History History reviewed. No pertinent family history.  Social History Social History  Substance Use Topics  . Smoking status: Current Every Day Smoker    Packs/day: 0.25    Years: 0.00    Types: Cigarettes  . Smokeless tobacco: Current User  . Alcohol use No     Allergies   Patient has no known allergies.   Review of Systems Review of Systems  Reason unable to perform ROS: See HPI as above.     Physical Exam Triage Vital Signs ED Triage Vitals [07/15/17 1830]  Enc Vitals Group     BP      Pulse      Resp      Temp      Temp src      SpO2      Weight      Height      Head Circumference      Peak Flow      Pain Score 10     Pain Loc      Pain Edu?      Excl. in [place]?    No data found.   Updated Vital Signs BP 115/78 (BP Location: Right Arm)   Pulse 92   Temp 98.8 F (37.1 C) (Oral)   Resp 19   SpO2 98%       Physical Exam  Constitutional: She is oriented to person, place, and time. She appears well-developed and well-nourished. No  distress.  HENT:  Head: Normocephalic and atraumatic.  Eyes: Pupils are equal, round, and reactive to light. Conjunctivae and EOM are normal.  Neck: Normal range of motion. Neck supple. No spinous process tenderness present. Normal range of motion present.  Cardiovascular: Normal rate and regular rhythm.  Exam reveals no gallop and no friction rub.   Murmur heard. Pulmonary/Chest: Effort normal and breath sounds normal. She has no wheezes. She has no rales.  Musculoskeletal:  No tenderness on palpation of bilateral knees. Full range of motion. Strength normal and equal bilaterally. Sensation and pedal pulses unable to assess due to patient and bilateral cam walkers.  Neurological: She is alert and oriented to person, place, and time. She has normal strength. She is not disoriented. No cranial nerve deficit or sensory deficit. Coordination and gait normal.  Unable to fully assess [place] due to patient's lack of cooperation. Patient able to ambulate and walk without problem.  Skin: Skin is warm and dry.     UC Treatments / Results  Labs (all labs ordered are listed, but only abnormal results are displayed) Labs Reviewed - No data to display  EKG  EKG Interpretation None       Radiology No results found.  Procedures Procedures (including critical care time)  Medications Ordered in UC Medications - No data to display   Initial Impression / Assessment and Plan / UC Course  I have reviewed the triage vital signs and the nursing notes.  Pertinent labs & imaging results that were available during my care of the patient were reviewed by me and considered in my medical decision making (see chart for details).    Discussed with legal guardian, neurology exam normal with some sensitivity to light. Legal guardian endorses patient at baseline, denies mental status change, nausea, vomiting, lethargy. Discussed without the CT head, unable to completely rule out intracranial bleed, legal  guardian expresses understanding  and agrees to strict monitoring for any changes. Strict return precautions given. Follow up with PCP as scheduled.  Final Clinical Impressions(s) / UC Diagnoses   Final diagnoses:  Fall, initial encounter    New Prescriptions Discharge Medication List as of 07/15/2017  7:06 PM        Ok Edwards, PA-C 07/15/17 2206

## 2017-07-15 NOTE — Discharge Instructions (Signed)
Neurology exam is normal today with slight sensitivity to light. Monitor for worsening of symptoms, headache, nausea, vomiting, weakness, lethargy, increased sensitivity to light, go to the emergency department immediately. Follow-up with PCP as scheduled.

## 2017-07-18 ENCOUNTER — Ambulatory Visit: Payer: Medicaid Other | Admitting: Podiatry

## 2017-07-24 ENCOUNTER — Ambulatory Visit: Payer: Medicaid Other | Admitting: Podiatry

## 2017-07-24 ENCOUNTER — Emergency Department
Admission: EM | Admit: 2017-07-24 | Discharge: 2017-07-24 | Disposition: A | Discharge status: Home / Self Care | Payer: Medicaid Other | Attending: Emergency Medicine | Admitting: Emergency Medicine

## 2017-07-24 DIAGNOSIS — Z7982 Long term (current) use of aspirin: Secondary | ICD-10-CM | POA: Insufficient documentation

## 2017-07-24 DIAGNOSIS — Z794 Long term (current) use of insulin: Secondary | ICD-10-CM | POA: Diagnosis not present

## 2017-07-24 DIAGNOSIS — Z79899 Other long term (current) drug therapy: Secondary | ICD-10-CM | POA: Insufficient documentation

## 2017-07-24 DIAGNOSIS — E119 Type 2 diabetes mellitus without complications: Secondary | ICD-10-CM | POA: Insufficient documentation

## 2017-07-24 DIAGNOSIS — I120 Hypertensive chronic kidney disease with stage 5 chronic kidney disease or end stage renal disease: Secondary | ICD-10-CM | POA: Insufficient documentation

## 2017-07-24 DIAGNOSIS — F17210 Nicotine dependence, cigarettes, uncomplicated: Secondary | ICD-10-CM | POA: Diagnosis not present

## 2017-07-24 DIAGNOSIS — I959 Hypotension, unspecified: Secondary | ICD-10-CM | POA: Diagnosis not present

## 2017-07-24 DIAGNOSIS — N186 End stage renal disease: Secondary | ICD-10-CM | POA: Insufficient documentation

## 2017-07-24 DIAGNOSIS — Z992 Dependence on renal dialysis: Secondary | ICD-10-CM | POA: Insufficient documentation

## 2017-07-24 LAB — BASIC METABOLIC PANEL
Anion gap: 11 (ref 5–15)
BUN: 13 mg/dL (ref 6–20)
CALCIUM: 9.1 mg/dL (ref 8.9–10.3)
CHLORIDE: 96 mmol/L — AB (ref 101–111)
CO2: 30 mmol/L (ref 22–32)
CREATININE: 4.78 mg/dL — AB (ref 0.44–1.00)
GFR calc Af Amer: 13 mL/min — ABNORMAL LOW (ref >60)
GFR calc non Af Amer: 11 mL/min — ABNORMAL LOW (ref >60)
GLUCOSE: 172 mg/dL — AB (ref 65–99)
Potassium: 3.2 mmol/L — ABNORMAL LOW (ref 3.5–5.1)
Sodium: 137 mmol/L (ref 135–145)

## 2017-07-24 LAB — CBC WITH DIFFERENTIAL/PLATELET
BASOS PCT: 1 %
Basophils Absolute: 0 10*3/uL (ref 0–0.1)
EOS PCT: 4 %
Eosinophils Absolute: 0.2 10*3/uL (ref 0–0.7)
HEMATOCRIT: 34.7 % — AB (ref 35.0–47.0)
HEMOGLOBIN: 11.8 g/dL — AB (ref 12.0–16.0)
Lymphocytes Relative: 28 %
Lymphs Abs: 1.3 10*3/uL (ref 1.0–3.6)
MCH: 34.6 pg — AB (ref 26.0–34.0)
MCHC: 34 g/dL (ref 32.0–36.0)
MCV: 101.8 fL — ABNORMAL HIGH (ref 80.0–100.0)
MONO ABS: 0.4 10*3/uL (ref 0.2–0.9)
Monocytes Relative: 8 %
NEUTROS ABS: 2.8 10*3/uL (ref 1.4–6.5)
Neutrophils Relative %: 59 %
Platelets: 167 10*3/uL (ref 150–440)
RBC: 3.41 MIL/uL — ABNORMAL LOW (ref 3.80–5.20)
RDW: 16.3 % — AB (ref 11.5–14.5)
WBC: 4.7 10*3/uL (ref 3.6–11.0)

## 2017-07-24 LAB — GLUCOSE, CAPILLARY: GLUCOSE-CAPILLARY: 148 mg/dL — AB (ref 65–99)

## 2017-07-24 NOTE — ED Notes (Signed)
Kelly [name] from group home facility, caregiver was here to visit pt but pt is sleeping, given number for update [phone].

## 2017-07-24 NOTE — ED Notes (Signed)
Pt is refusing to have her blood drawn, states she would only allow [facility] ED tech to draw her labs. Felicia asked and states she will attempt the blood draw.

## 2017-07-24 NOTE — ED Provider Notes (Signed)
[facility] Emergency Department Provider Note  Time seen: 10:48 AM  I have reviewed the triage vital signs and the nursing notes.   HISTORY  Chief Complaint Hypotension    HPI Kelly [name] is a 36 y.o. female with a past medical history of bipolar, end-stage renal disease on hemodialysis, diabetes, hypertension, presents to the emergency department for hypotension. According to EMS the patient was at her dialysis center getting her normally scheduled dialysis. They state during the dialysis session her blood pressure droppedinto the 80s so they stopped her dialysis and sent to the emergency department for evaluation. Patient denies any chest pain or trouble breathing she does state on review of systems occasional cough which has been ongoing for months per patient. She also states intermittent abdominal pain again for months. Upon arrival to the emergency department the patient's blood pressure is normal 120/89. Patient is requesting food, no distress.  Past Medical History:  Diagnosis Date  . Alcohol abuse    chronic  . Bipolar disorder ([place])   . Chronic kidney disease   . Cognitive changes   . Cognitive impairment   . Depression   . Depression   . Diabetes mellitus without complication ([place])   . Elevated lipids   . Hyperparathyroidism ([place])   . Hypertension   . Staph aureus infection    A/V fistula  . UTI (lower urinary tract infection)     Patient Active Problem List   Diagnosis Date Noted  . Multiple fractures of both lower extremities 06/18/2017  . 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 Hyperkalemia 06/25/2016  . Adjustment disorder with mixed disturbance of emotions and conduct   . Diabetes ([place]) 04/23/2015  . Essential hypertension 04/23/2015  . ESRD on dialysis ([place]) 04/23/2015  . Pain of left arm 04/22/2015    Past Surgical History:  Procedure Laterality Date  . AV FISTULA PLACEMENT    . CHOLECYSTECTOMY    . dialysis perma  cath Right    chest  . PERIPHERAL VASCULAR CATHETERIZATION N/A 05/24/2015   Procedure: Dialysis/Perma Catheter Removal;  Surgeon: Katha Cabal, MD;  Location: [place] [facility];  Service: Cardiovascular;  Laterality: N/A;  . PERIPHERAL VASCULAR CATHETERIZATION Left 02/28/2016   Procedure: A/V Shuntogram/Fistulagram;  Surgeon: Katha Cabal, MD;  Location: [place] [facility];  Service: Cardiovascular;  Laterality: Left;  . PERIPHERAL VASCULAR CATHETERIZATION N/A 02/28/2016   Procedure: A/V Shunt Intervention;  Surgeon: Katha Cabal, MD;  Location: [place] [facility];  Service: Cardiovascular;  Laterality: N/A;  . PERIPHERAL VASCULAR CATHETERIZATION N/A 06/26/2016   Procedure: A/V Shuntogram/Fistulagram;  Surgeon: Katha Cabal, MD;  Location: [place] [facility];  Service: Cardiovascular;  Laterality: N/A;  . PERIPHERAL VASCULAR CATHETERIZATION N/A 09/10/2016   Procedure: A/V Shuntogram/Fistulagram;  Surgeon: Algernon Huxley, MD;  Location: [place] [facility];  Service: Cardiovascular;  Laterality: N/A;  . PERIPHERAL VASCULAR CATHETERIZATION Left 09/14/2016   Procedure: Thrombectomy;  Surgeon: Katha Cabal, MD;  Location: [place] [facility];  Service: Cardiovascular;  Laterality: Left;  Marland Kitchen VASCULAR ACCESS DEVICE INSERTION Left 04/22/2015   Procedure: INSERTION OF HERO VASCULAR ACCESS DEVICE;  Surgeon: Katha Cabal, MD;  Location: [facility];  Service: Vascular;  Laterality: Left;    Prior to Admission medications   Medication Sig Start Date End Date Taking? Authorizing Provider  acetaminophen (TYLENOL) 325 MG tablet Take 2 tablets (650 mg total) by mouth every 6 (six) hours as needed for mild pain, moderate pain  or headache. 10/01/16   Clapacs, Madie Reno, MD  amLODipine (NORVASC) 10 MG tablet Take 10 mg by mouth at bedtime. *hold for systolic blood pressure <852, call MD if holding this medication.*    [provider]  aspirin 81 MG chewable tablet Chew 1 tablet (81  mg total) by mouth daily. 10/02/16   Clapacs, Madie Reno, MD  B Complex-C-Folic Acid (RENA-VITE RX) 1 MG TABS Take 1 tablet by mouth daily. 10/01/16   Clapacs, Madie Reno, MD  bacitracin ointment Apply to affected area twice daily 11/27/16 11/27/17  Hagler, Jami L, PA-C  calcium acetate (PHOSLO) 667 MG capsule Take 2 capsules (1,334 mg total) by mouth 2 (two) times daily with a meal. 10/01/16   Clapacs, Madie Reno, MD  cinacalcet (SENSIPAR) 60 MG tablet Take 1 tablet (60 mg total) by mouth daily. 10/01/16   Clapacs, Madie Reno, MD  cloNIDine (CATAPRES) 0.1 MG tablet Take 1 tablet (0.1 mg total) by mouth 2 (two) times daily. 10/01/16   Clapacs, Madie Reno, MD  divalproex (DEPAKOTE) 500 MG DR tablet Take 2 tablets (1,000 mg total) by mouth at bedtime. 10/01/16   Clapacs, Madie Reno, MD  docusate sodium (COLACE) 100 MG capsule Take 1 capsule (100 mg total) by mouth at bedtime. 10/01/16   Clapacs, Madie Reno, MD  escitalopram (LEXAPRO) 10 MG tablet Take 1.5 tablets (15 mg total) by mouth daily. 10/02/16   Clapacs, Madie Reno, MD  folic acid (FOLVITE) 1 MG tablet Take 2 tablets (2 mg total) by mouth at bedtime. 10/01/16   Clapacs, Madie Reno, MD  hydrALAZINE (APRESOLINE) 25 MG tablet Take 1 tablet (25 mg total) by mouth 3 (three) times daily. 10/01/16   Clapacs, Madie Reno, MD  insulin aspart (NOVOLOG) 100 UNIT/ML injection Inject 0-12 Units into the skin 3 (three) times daily before meals. Per sliding scale    [provider]  irbesartan (AVAPRO) 300 MG tablet Take 1 tablet (300 mg total) by mouth at bedtime. 10/01/16   Clapacs, Madie Reno, MD  lidocaine-prilocaine (EMLA) cream Apply 1 application topically 3 (three) times a week. Apply to access site 30 minutes before dialysis. 10/01/16   Clapacs, Madie Reno, MD  medroxyPROGESTERone (DEPO-PROVERA) 150 MG/ML injection Inject 150 mg into the muscle every 3 (three) months.    [provider]  metoprolol (LOPRESSOR) 50 MG tablet Take 1 tablet (50 mg total) by mouth 2 (two) times daily. 10/01/16    Clapacs, Madie Reno, MD  Multiple Vitamin (THEREMS) TABS Take 1 tablet by mouth daily. 10/01/16   Clapacs, Madie Reno, MD  oxyCODONE (OXY IR/ROXICODONE) 5 MG immediate release tablet Take 1 tablet (5 mg total) by mouth every 6 (six) hours as needed for moderate pain. 07/01/17   Fritzi Mandes, MD  pantoprazole (PROTONIX) 20 MG tablet Take 1 tablet (20 mg total) by mouth daily. 04/20/17   Shary Decamp, PA-C  patiromer (VELTASSA) 8.4 g packet Take 1 packet (8.4 g total) by mouth daily. 10/01/16   Clapacs, Madie Reno, MD  QUEtiapine (SEROQUEL) 200 MG tablet Take 1 tablet (200 mg total) by mouth 2 (two) times daily. 10/01/16   Clapacs, Madie Reno, MD  traZODone (DESYREL) 100 MG tablet Take 1 tablet (100 mg total) by mouth at bedtime. 10/01/16   Clapacs, Madie Reno, MD  vitamin B-12 (CYANOCOBALAMIN) 1000 MCG tablet Take 1 tablet (1,000 mcg total) by mouth at bedtime. 10/01/16   Clapacs, Madie Reno, MD    No Known Allergies  No family history on file.  Social History Social History  Substance Use Topics  . Smoking status: Current Every Day Smoker    Packs/day: 0.25    Years: 0.00    Types: Cigarettes  . Smokeless tobacco: Current User  . Alcohol use No    Review of Systems Constitutional: Negative for fever. Cardiovascular: Negative for chest pain. Respiratory: Negative for shortness of breath.Intermittent cough times months Gastrointestinal: Intermittent abdominal pain times months Genitourinary: Does not make urine Musculoskeletal: Negative for back pain. Neurological: Negative for headache All other ROS negative  ____________________________________________   PHYSICAL EXAM:  VITAL SIGNS: ED Triage Vitals  Enc Vitals Group     BP 07/24/17 1028 120/89     Pulse Rate 07/24/17 1028 93     Resp 07/24/17 1028 17     Temp 07/24/17 1028 97.8 F (36.6 C)     Temp Source 07/24/17 1028 Oral     SpO2 07/24/17 1028 99 %     Weight 07/24/17 1028 204 lb (92.5 kg)     Height 07/24/17 1028 5\' 1"  (1.549 m)     Head  Circumference --      Peak Flow --      Pain Score 07/24/17 1027 10     Pain Loc --      Pain Edu? --      Excl. in [place]? --     Constitutional: Alert and oriented. Well appearing and in no distress. Eyes: Normal exam ENT   Head: Normocephalic and atraumatic.   Mouth/Throat: Mucous membranes are moist. Cardiovascular: Normal rate, regular rhythm. No murmur Respiratory: Normal respiratory effort without tachypnea nor retractions. Breath sounds are clear  Gastrointestinal: Soft and nontender. No distention. Musculoskeletal: Lower extremity braces, nontender, no significant edema. Neurologic:  Normal speech and language. No gross focal neurologic deficits  Skin:  Skin is warm, dry and intact.  Psychiatric: Mood and affect are normal.   ____________________________________________   INITIAL IMPRESSION / ASSESSMENT AND PLAN / ED COURSE  Pertinent labs & imaging results that were available during my care of the patient were reviewed by me and considered in my medical decision making (see chart for details).  The patient presents to the emergency department for hypotension during dialysis. Her hypotension appears to have completely resolved upon arrival to the emergency department. Patient is asking for food and drink upon arrival. We will monitor in the emergency department to ensure that her blood pressure remained stable. We will check labs as the patient was pulled off approximately 30 minutes off her dialysis. Overall the patient appears extremely well, no distress.  Labs are reassuring potassium is 3.2. Patient's blood pressures have remained normal currently 116/79. Patient has eaten a meal tray without issue. At this time I believe the patient is medically safe for discharge home with PCP follow-up with continued dialysis as scheduled.  ____________________________________________   FINAL CLINICAL IMPRESSION(S) / ED DIAGNOSES  Hypotension    Harvest Dark,  MD 07/24/17 1221

## 2017-07-24 NOTE — ED Triage Notes (Signed)
Pt sent from dialysis via EMS, states she was 27 min shy of completing dialysis when they stopped treatment due to b/p systolic in the 74'V.Marland Kitchen

## 2017-07-24 NOTE — ED Notes (Signed)
Patient sleeping comfortably at this time.

## 2017-07-30 ENCOUNTER — Ambulatory Visit: Payer: Medicaid Other

## 2017-07-30 ENCOUNTER — Ambulatory Visit (INDEPENDENT_AMBULATORY_CARE_PROVIDER_SITE_OTHER): Payer: Medicaid Other

## 2017-07-30 ENCOUNTER — Ambulatory Visit (INDEPENDENT_AMBULATORY_CARE_PROVIDER_SITE_OTHER): Payer: Medicaid Other | Admitting: Podiatry

## 2017-07-30 ENCOUNTER — Encounter: Payer: Self-pay | Admitting: Podiatry

## 2017-07-30 DIAGNOSIS — S92301D Fracture of unspecified metatarsal bone(s), right foot, subsequent encounter for fracture with routine healing: Secondary | ICD-10-CM | POA: Diagnosis not present

## 2017-07-30 DIAGNOSIS — R52 Pain, unspecified: Secondary | ICD-10-CM

## 2017-07-30 NOTE — Progress Notes (Signed)
This patient presents the office for an evaluation of both fractures in the midfoot of both feet. She was diagnosed as having fractures at the base of the metatarsals to the [employer] joint on 7/24.  She presented and had no history of trauma or injury to the foot. Upon her emergency Department visit. She was tight diagnosed with fractures and treated with cam walkers on both feet.  She lives in a home and her guardian says that she has been walking barefoot since she has difficulty walking wearing the cam walkers on both feet.  She presents the office today stating that her right foot has markedly improved and she is not having any pain or discomfort.  She says that she is still having pain and swelling on the top of her left foot despite wearing her cam walker.  She was referred to this office by [facility] and so family practice.  She presents the office today for an evaluation and treatment of both feet.  Neurovascular status is intact to both feet.  No evidence of any pain or swelling noted on the dorsum of the right foot.  There is continued swelling with pain noted at the base of the second, third and fourth metatarsals, left foot.     Closed metatarsal fractures  B/L.  ROV.  X-rays were taken of both the right and the left foot.  Examination of the left x-ray reveals healing sclerosis noted at the base of the metatarsals.  There is also calcification noted of the vessels in the left foot.  The right foot does have similar sclerotic healing noted at the base of the metatarsals.  The fractures noted in both feet appear to be healing. Upon examination of the x-rays.  Examination of the sclerosis reveals that the healing must be about 10-12 weeks minimum to develop this amount of sclerosis at the fracture sites.  Therefore, I recommended that she wear clogs on her right foot and stop wearing the cam walker on her right foot.  She is to continue wearing her cam walker on her left foot. Since this continues to  be painful and swollen.  Patient to return to the office in 6 weeks for continued evaluation of her closed fractures both feet.   Gardiner Barefoot DPM

## 2017-08-13 ENCOUNTER — Telehealth: Payer: Self-pay | Admitting: *Deleted

## 2017-08-13 NOTE — Telephone Encounter (Addendum)
Kelly [name] - caregiver states pt's after visit note states she needs a special shoe and she is not certain of the style of shoe, where to get it and she needs a rx for medicaid to cover. 08/13/2017-[name] Watlington called and reiterated the request from 08/12/2017 and included she did not want to pay out of pocket for a shoe that pt would wear temporarily. 08/14/2017-I left message informing pt's caregiver - Horris Latino, that [employer] carries the [employer] surgical shoe, it is not covered by any insurance and cost $47.00. That I had spoken to DR. Prudence Davidson and he had wanted pt to wear a stiff bottom shoe that did not allow for a lot of bending of the foot, to call again with concerns.

## 2017-09-10 ENCOUNTER — Ambulatory Visit (INDEPENDENT_AMBULATORY_CARE_PROVIDER_SITE_OTHER): Payer: Medicaid Other | Admitting: Podiatry

## 2017-09-10 ENCOUNTER — Ambulatory Visit (INDEPENDENT_AMBULATORY_CARE_PROVIDER_SITE_OTHER): Payer: Medicaid Other

## 2017-09-10 DIAGNOSIS — R52 Pain, unspecified: Secondary | ICD-10-CM

## 2017-09-10 DIAGNOSIS — S92301D Fracture of unspecified metatarsal bone(s), right foot, subsequent encounter for fracture with routine healing: Secondary | ICD-10-CM | POA: Diagnosis not present

## 2017-09-10 NOTE — Progress Notes (Signed)
This patient presents to the office for continued evaluation of both feet due to multiple fractures at the base of multiple metatarsals.  She was seen in this office approximately 6 weeks ago and diagnosed with multiple  fractures.  The patient had minimal pain and swelling for person who has had an acute fracture at her initial visit.  Therefore, I assumed this was an old fracture that already had started healing.  I told her to return to the office in 6 weeks for further evaluation and x-ray.  She presents the office today stating that she's having no pain or discomfort or swelling in her right foot.  She says that she has mild pain on the top of her left foot.  She says the pain is only 2 out of 10.  Patient is walking with no evidence of any limp in her gait. She presents the office today for continued evaluation of her closed fractures and to discuss acquiring diabetic shoes.    Podiatric Exam: Vascular: dorsalis pedis and posterior tibial pulses are palpable bilateral. Capillary return is immediate. Temperature gradient is WNL. Skin turgor WNL  Sensorium: Normal Semmes Weinstein monofilament test. Normal tactile sensation bilaterally. Nail Exam: Pt has thick disfigured discolored nails with subungual debris noted bilateral entire nail hallux through fifth toenails Ulcer Exam: There is no evidence of ulcer or pre-ulcerative changes or infection. Orthopedic Exam: Muscle tone and strength are WNL. No limitations in general ROM. No crepitus or effusions noted. Foot type and digits show no abnormalities. Bony prominences are unremarkable No palpable pain at base metatarsals right foot.  No swelling noted right foot. There is palpable pain noted at the base of the second metatarsal of the left foot with minimal swelling noted. Skin: No Porokeratosis. No infection noted. Porokeratotic lesions sub 5th metatarsal  B/L.  S/P Closed fracture metatarsals  B/L  ROV  x-rays taken do reveal significant bone  callus noted at the base of the metatarsals both feet indicating bone healing.  Clinically there is minimal swelling and limited pain only in the left foot  . This patient has done well healing from her closed metatarsal fractures.  Told this patient. She is not eligible for diabetic shoes. Since she already has Medicaid.  She is to return the office in 3 months for preventative foot care services.      Gardiner Barefoot DPM

## 2017-09-15 ENCOUNTER — Emergency Department (HOSPITAL_COMMUNITY)
Admission: EM | Admit: 2017-09-15 | Discharge: 2017-09-15 | Disposition: A | Discharge status: Home / Self Care | Payer: Medicaid Other | Attending: Emergency Medicine | Admitting: Emergency Medicine

## 2017-09-15 ENCOUNTER — Encounter (HOSPITAL_COMMUNITY): Payer: Self-pay | Admitting: Emergency Medicine

## 2017-09-15 DIAGNOSIS — Z8659 Personal history of other mental and behavioral disorders: Secondary | ICD-10-CM | POA: Insufficient documentation

## 2017-09-15 DIAGNOSIS — R1032 Left lower quadrant pain: Secondary | ICD-10-CM | POA: Insufficient documentation

## 2017-09-15 DIAGNOSIS — Z992 Dependence on renal dialysis: Secondary | ICD-10-CM | POA: Diagnosis not present

## 2017-09-15 DIAGNOSIS — H1033 Unspecified acute conjunctivitis, bilateral: Secondary | ICD-10-CM | POA: Insufficient documentation

## 2017-09-15 DIAGNOSIS — R109 Unspecified abdominal pain: Secondary | ICD-10-CM | POA: Diagnosis present

## 2017-09-15 DIAGNOSIS — Z7982 Long term (current) use of aspirin: Secondary | ICD-10-CM | POA: Insufficient documentation

## 2017-09-15 DIAGNOSIS — F17210 Nicotine dependence, cigarettes, uncomplicated: Secondary | ICD-10-CM | POA: Insufficient documentation

## 2017-09-15 DIAGNOSIS — Z794 Long term (current) use of insulin: Secondary | ICD-10-CM | POA: Insufficient documentation

## 2017-09-15 DIAGNOSIS — N186 End stage renal disease: Secondary | ICD-10-CM | POA: Insufficient documentation

## 2017-09-15 DIAGNOSIS — E1122 Type 2 diabetes mellitus with diabetic chronic kidney disease: Secondary | ICD-10-CM | POA: Insufficient documentation

## 2017-09-15 DIAGNOSIS — Z79899 Other long term (current) drug therapy: Secondary | ICD-10-CM | POA: Diagnosis not present

## 2017-09-15 DIAGNOSIS — R45851 Suicidal ideations: Secondary | ICD-10-CM | POA: Insufficient documentation

## 2017-09-15 DIAGNOSIS — I120 Hypertensive chronic kidney disease with stage 5 chronic kidney disease or end stage renal disease: Secondary | ICD-10-CM | POA: Diagnosis not present

## 2017-09-15 MED ORDER — ACETAMINOPHEN 325 MG PO TABS
650.0000 mg | ORAL_TABLET | Freq: Once | ORAL | Status: AC
  Administered 2017-09-15: 650 mg via ORAL
  Filled 2017-09-15: qty 2

## 2017-09-15 MED ORDER — TOBRAMYCIN 0.3 % OP SOLN
1.0000 [drp] | Freq: Once | OPHTHALMIC | Status: AC
  Administered 2017-09-15: 1 [drp] via OPHTHALMIC
  Filled 2017-09-15: qty 5

## 2017-09-15 NOTE — ED Notes (Signed)
Patient is alert and oriented x3.  She was given DC instructions and follow up visit instructions.  Patient gave verbal understanding. She was DC ambulatory under her own power to home.  V/S stable.  He was not showing any signs of distress on DC 

## 2017-09-15 NOTE — ED Notes (Signed)
[place] : [place] Password: [phone]

## 2017-09-15 NOTE — ED Provider Notes (Signed)
[facility] DEPT Provider Note   CSN: [id number] Arrival date & time: 09/15/17  1035     History   Chief Complaint Chief Complaint  Patient presents with  . Abdominal Pain  . Suicidal    HPI Kelly [name] is a 36 y.o. female.  Patient w hx esrd/hd, bipolar, c/o left sided abd pain in past couple days. Pain intermittent, mild-moderate, non radiating,  lasts seconds to minutes, without specific exacerbating or alleviating factors. Nausea. No vomiting. No diarrhea or constipation. Denies fever or chills. Denies dysuria or hematuria. No vaginal discharge or bleeding. States had her normal dialysis 2 days ago. No sob. States hx depression, and notes intermittent feelings of depression for years without acute/abrupt change this week.  States finds living at group home stressful, and at times others there bother her - denies thoughts of harm to self or others. Also notes bil eye irritation, redness, w matting no lashes in past 1-2 days. Denies change in vision. Denies chemical or fb exposure. No contacts.    The history is provided by the patient.  Abdominal Pain   Associated symptoms include nausea. Pertinent negatives include fever and headaches.    Past Medical History:  Diagnosis Date  . Alcohol abuse    chronic  . Bipolar disorder ([place])   . Chronic kidney disease   . Cognitive changes   . Cognitive impairment   . Depression   . Depression   . Diabetes mellitus without complication ([place])   . Elevated lipids   . Hyperparathyroidism ([place])   . Hypertension   . Staph aureus infection    A/V fistula  . UTI (lower urinary tract infection)     Patient Active Problem List   Diagnosis Date Noted  . Multiple fractures of both lower extremities 06/18/2017  . 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 Hyperkalemia 06/25/2016  . Adjustment disorder with mixed disturbance of emotions and conduct   . Diabetes ([place]) 04/23/2015  . Essential  hypertension 04/23/2015  . ESRD on dialysis ([place]) 04/23/2015  . Pain of left arm 04/22/2015    Past Surgical History:  Procedure Laterality Date  . AV FISTULA PLACEMENT    . CHOLECYSTECTOMY    . dialysis perma cath Right    chest  . PERIPHERAL VASCULAR CATHETERIZATION N/A 05/24/2015   Procedure: Dialysis/Perma Catheter Removal;  Surgeon: Katha Cabal, MD;  Location: [facility];  Service: Cardiovascular;  Laterality: N/A;  . PERIPHERAL VASCULAR CATHETERIZATION Left 02/28/2016   Procedure: A/V Shuntogram/Fistulagram;  Surgeon: Katha Cabal, MD;  Location: [facility];  Service: Cardiovascular;  Laterality: Left;  . PERIPHERAL VASCULAR CATHETERIZATION N/A 02/28/2016   Procedure: A/V Shunt Intervention;  Surgeon: Katha Cabal, MD;  Location: [facility];  Service: Cardiovascular;  Laterality: N/A;  . PERIPHERAL VASCULAR CATHETERIZATION N/A 06/26/2016   Procedure: A/V Shuntogram/Fistulagram;  Surgeon: Katha Cabal, MD;  Location: [facility];  Service: Cardiovascular;  Laterality: N/A;  . PERIPHERAL VASCULAR CATHETERIZATION N/A 09/10/2016   Procedure: A/V Shuntogram/Fistulagram;  Surgeon: Algernon Huxley, MD;  Location: [facility];  Service: Cardiovascular;  Laterality: N/A;  . PERIPHERAL VASCULAR CATHETERIZATION Left 09/14/2016   Procedure: Thrombectomy;  Surgeon: Katha Cabal, MD;  Location: [facility];  Service: Cardiovascular;  Laterality: Left;  Marland Kitchen VASCULAR ACCESS DEVICE INSERTION Left 04/22/2015   Procedure: INSERTION OF HERO VASCULAR ACCESS DEVICE;  Surgeon: Katha Cabal, MD;  Location: [facility];  Service:  Vascular;  Laterality: Left;    OB History    No data available       Home Medications    Prior to Admission medications   Medication Sig Start Date End Date Taking? Authorizing Provider  aspirin 81 MG chewable tablet Chew 1 tablet (81 mg total) by mouth daily. 10/02/16  Yes Clapacs, Madie Reno, MD    acetaminophen (TYLENOL) 325 MG tablet Take 2 tablets (650 mg total) by mouth every 6 (six) hours as needed for mild pain, moderate pain or headache. 10/01/16   Clapacs, Madie Reno, MD  amLODipine (NORVASC) 10 MG tablet Take 10 mg by mouth at bedtime. *hold for systolic blood pressure <196, call MD if holding this medication.*    [provider]  B Complex-C-Folic Acid (RENA-VITE RX) 1 MG TABS Take 1 tablet by mouth daily. 10/01/16   Clapacs, Madie Reno, MD  bacitracin ointment Apply to affected area twice daily 11/27/16 11/27/17  Hagler, Jami L, PA-C  calcium acetate (PHOSLO) 667 MG capsule Take 2 capsules (1,334 mg total) by mouth 2 (two) times daily with a meal. 10/01/16   Clapacs, Madie Reno, MD  cinacalcet (SENSIPAR) 60 MG tablet Take 1 tablet (60 mg total) by mouth daily. 10/01/16   Clapacs, Madie Reno, MD  cloNIDine (CATAPRES) 0.1 MG tablet Take 1 tablet (0.1 mg total) by mouth 2 (two) times daily. 10/01/16   Clapacs, Madie Reno, MD  divalproex (DEPAKOTE) 500 MG DR tablet Take 2 tablets (1,000 mg total) by mouth at bedtime. 10/01/16   Clapacs, Madie Reno, MD  docusate sodium (COLACE) 100 MG capsule Take 1 capsule (100 mg total) by mouth at bedtime. 10/01/16   Clapacs, Madie Reno, MD  escitalopram (LEXAPRO) 10 MG tablet Take 1.5 tablets (15 mg total) by mouth daily. 10/02/16   Clapacs, Madie Reno, MD  folic acid (FOLVITE) 1 MG tablet Take 2 tablets (2 mg total) by mouth at bedtime. 10/01/16   Clapacs, Madie Reno, MD  hydrALAZINE (APRESOLINE) 25 MG tablet Take 1 tablet (25 mg total) by mouth 3 (three) times daily. 10/01/16   Clapacs, Madie Reno, MD  insulin aspart (NOVOLOG) 100 UNIT/ML injection Inject 0-12 Units into the skin 3 (three) times daily before meals. Per sliding scale    [provider]  irbesartan (AVAPRO) 300 MG tablet Take 1 tablet (300 mg total) by mouth at bedtime. 10/01/16   Clapacs, Madie Reno, MD  lidocaine-prilocaine (EMLA) cream Apply 1 application topically 3 (three) times a week. Apply to access site 30 minutes  before dialysis. 10/01/16   Clapacs, Madie Reno, MD  medroxyPROGESTERone (DEPO-PROVERA) 150 MG/ML injection Inject 150 mg into the muscle every 3 (three) months.    [provider]  metoprolol (LOPRESSOR) 50 MG tablet Take 1 tablet (50 mg total) by mouth 2 (two) times daily. 10/01/16   Clapacs, Madie Reno, MD  Multiple Vitamin (THEREMS) TABS Take 1 tablet by mouth daily. 10/01/16   Clapacs, Madie Reno, MD  oxyCODONE (OXY IR/ROXICODONE) 5 MG immediate release tablet Take 1 tablet (5 mg total) by mouth every 6 (six) hours as needed for moderate pain. 07/01/17   Fritzi Mandes, MD  pantoprazole (PROTONIX) 20 MG tablet Take 1 tablet (20 mg total) by mouth daily. 04/20/17   Shary Decamp, PA-C  patiromer (VELTASSA) 8.4 g packet Take 1 packet (8.4 g total) by mouth daily. 10/01/16   Clapacs, Madie Reno, MD  QUEtiapine (SEROQUEL) 200 MG tablet Take 1 tablet (200 mg total) by mouth 2 (two) times  daily. 10/01/16   Clapacs, Madie Reno, MD  traZODone (DESYREL) 100 MG tablet Take 1 tablet (100 mg total) by mouth at bedtime. 10/01/16   Clapacs, Madie Reno, MD  vitamin B-12 (CYANOCOBALAMIN) 1000 MCG tablet Take 1 tablet (1,000 mcg total) by mouth at bedtime. 10/01/16   Clapacs, Madie Reno, MD    Family History History reviewed. No pertinent family history.  Social History Social History  Substance Use Topics  . Smoking status: Current Every Day Smoker    Packs/day: 0.25    Years: 0.00    Types: Cigarettes  . Smokeless tobacco: Current User  . Alcohol use No     Allergies   Patient has no known allergies.   Review of Systems Review of Systems  Constitutional: Negative for fever.  HENT: Negative for sore throat.   Eyes: Positive for discharge and redness. Negative for visual disturbance.  Respiratory: Negative for shortness of breath.   Cardiovascular: Negative for chest pain.  Gastrointestinal: Positive for abdominal pain and nausea.  Endocrine: Negative for polyuria.  Genitourinary: Negative for flank pain.    Musculoskeletal: Negative for back pain.  Skin: Negative for rash.  Neurological: Negative for headaches.  Hematological: Does not bruise/bleed easily.  Psychiatric/Behavioral: Negative for suicidal ideas.     Physical Exam Updated Vital Signs BP (!) 156/75 (BP Location: Left Arm)   Pulse 80   Temp 98.1 F (36.7 C) (Oral)   Resp 18   SpO2 100%   Physical Exam  Constitutional: She appears well-developed and well-nourished. No distress.  HENT:  Mouth/Throat: Oropharynx is clear and moist.  Eyes: Pupils are equal, round, and reactive to light. No scleral icterus.  bil conjunctivitis.   Neck: Neck supple. No tracheal deviation present.  Cardiovascular: Normal rate, regular rhythm, normal heart sounds and intact distal pulses.   Pulmonary/Chest: Effort normal and breath sounds normal. No respiratory distress.  Abdominal: Soft. Normal appearance and bowel sounds are normal. She exhibits no distension and no mass. There is no tenderness. There is no rebound and no guarding. No hernia.  Genitourinary:  Genitourinary Comments: No cva tenderness  Musculoskeletal: She exhibits no edema.  Lymphadenopathy:    She has no cervical adenopathy.  Neurological: She is alert.  Skin: Skin is warm and dry. No rash noted. She is not diaphoretic.  Psychiatric: She has a normal mood and affect.  Nursing note and vitals reviewed.    ED Treatments / Results  Labs (all labs ordered are listed, but only abnormal results are displayed)   EKG  EKG Interpretation None       Radiology No results found.  Procedures Procedures (including critical care time)  Medications Ordered in ED Medications  acetaminophen (TYLENOL) tablet 650 mg (not administered)     Initial Impression / Assessment and Plan / ED Course  I have reviewed the triage vital signs and the nursing notes.  Pertinent labs & imaging results that were available during my care of the patient were reviewed by me and  considered in my medical decision making (see chart for details).  Labs sent from triage.  Reviewed nursing notes and prior charts for additional history.   tobrex gtts 1 drop each eye.   Po fluids. Acetaminophen po.  abd exam benign.   Patient refuses labs, urine or further testing.  Indicates is feeling improved.   Pt with no nv. Afebile. Patient continues to refuse any ED testing.   Patient currently appears stable for d/c. rec close pcp and outpt bh  f/u.     Final Clinical Impressions(s) / ED Diagnoses   Final diagnoses:  None    New Prescriptions New Prescriptions   No medications on file     Lajean Saver, MD 09/15/17 1238

## 2017-09-15 NOTE — ED Notes (Signed)
Bed: WTR5 Expected date:  Expected time:  Means of arrival:  Comments: 

## 2017-09-15 NOTE — ED Notes (Signed)
Patient only allowed me to look once. Patient stated that I need to come back later. Patient didn't allow me to continue to look for a spot to stick.

## 2017-09-15 NOTE — ED Triage Notes (Signed)
Per EMS. Pt from group home. Pt found wandering away from group home by neighbor. Pt told EMS she is having RLQ abd pain, n/v, and HA for the past 3 days. No emesis in triage.

## 2017-09-15 NOTE — Progress Notes (Addendum)
Patient is a resident at [facility] [phone] Patient has legal guardian through [employer]: Kelly [name] [phone]  Kingsley Spittle, [facility] Emergency Room Clinical Social Worker [phone]

## 2017-09-15 NOTE — Discharge Instructions (Signed)
It was our pleasure to provide your ER care today - we hope that you feel better.  Follow up with your primary care doctor, and behavioral health provider in the next 1-2 days.   For eye symptoms, use eye drops 1-2 drops in each eye 4x/day for the next 4 days.  Return to ER if worse, new symptoms, fevers, persistent vomiting, severe abdominal pain, other concern.

## 2017-09-15 NOTE — ED Notes (Signed)
Pt is being noncompliant with staff request and blood draw.Security on stand by

## 2017-10-29 ENCOUNTER — Ambulatory Visit: Payer: Medicaid Other | Admitting: Podiatry

## 2017-10-31 ENCOUNTER — Encounter: Payer: Self-pay | Admitting: Podiatry

## 2017-10-31 ENCOUNTER — Ambulatory Visit: Payer: Medicaid Other | Admitting: Podiatry

## 2017-10-31 DIAGNOSIS — B351 Tinea unguium: Secondary | ICD-10-CM | POA: Diagnosis not present

## 2017-10-31 DIAGNOSIS — M79674 Pain in right toe(s): Secondary | ICD-10-CM | POA: Diagnosis not present

## 2017-10-31 DIAGNOSIS — M79675 Pain in left toe(s): Secondary | ICD-10-CM | POA: Diagnosis not present

## 2017-10-31 DIAGNOSIS — E119 Type 2 diabetes mellitus without complications: Secondary | ICD-10-CM

## 2017-10-31 NOTE — Progress Notes (Signed)
His patient presents the office for an evaluation and treatment of her long painful nails. She previously had been treated for bilateral metatarsal fractures which have healed and is causing no pain or discomfort.  She says the nails are painful walking and wearing her shoes and her big toes.  This patient is diabetic. She is brought to the office by her guardian who was concerned about the blackened calluses on both heels.    He also says that he has purchased new shoes because someone informed him she had broken toes.  He says she was measured for the shoes, but after 2 weeks. She no longer can wear them since her feet do not fit into the shoes. She presents the office today for treatment of her long nails.  General Appearance  Alert, conversant and in no acute stress.  Vascular  Dorsalis pedis and posterior pulses are palpable  bilaterally.  Capillary return is within normal limits  Bilaterally. Temperature is within normal limits  Bilaterally  Neurologic  Senn-Weinstein monofilament wire test within normal limits  bilaterally. Muscle power  Within normal limits bilaterally.  Nails Thick disfigured discolored nails with subungual debris hallux  bilaterally  No evidence of bacterial infection or drainage bilaterally.  Orthopedic  No limitations of motion of motion feet bilaterally.  No crepitus or effusions noted.  No bony pathology or digital deformities noted. No palpable pain or swelling at the previous fractured sites of her metatarsals bilateral  Skin  normotropic skin with no porokeratosis noted bilaterally.  No signs of infections or ulcers noted.  Hemorrhagic callus noted on the heels bilaterally   Onychomycosis  B/l  ROV  Debridement of hallux nails.   RTC 3 months or prn.   Gardiner Barefoot DPM

## 2017-12-01 ENCOUNTER — Emergency Department
Admission: EM | Admit: 2017-12-01 | Discharge: 2017-12-01 | Disposition: A | Discharge status: Home / Self Care | Payer: Medicaid Other | Attending: Emergency Medicine | Admitting: Emergency Medicine

## 2017-12-01 ENCOUNTER — Other Ambulatory Visit: Payer: Self-pay

## 2017-12-01 ENCOUNTER — Encounter: Payer: Self-pay | Admitting: Intensive Care

## 2017-12-01 DIAGNOSIS — Z79899 Other long term (current) drug therapy: Secondary | ICD-10-CM | POA: Insufficient documentation

## 2017-12-01 DIAGNOSIS — Z794 Long term (current) use of insulin: Secondary | ICD-10-CM | POA: Diagnosis not present

## 2017-12-01 DIAGNOSIS — N186 End stage renal disease: Secondary | ICD-10-CM | POA: Diagnosis not present

## 2017-12-01 DIAGNOSIS — I120 Hypertensive chronic kidney disease with stage 5 chronic kidney disease or end stage renal disease: Secondary | ICD-10-CM | POA: Insufficient documentation

## 2017-12-01 DIAGNOSIS — Y92199 Unspecified place in other specified residential institution as the place of occurrence of the external cause: Secondary | ICD-10-CM | POA: Diagnosis not present

## 2017-12-01 DIAGNOSIS — E1122 Type 2 diabetes mellitus with diabetic chronic kidney disease: Secondary | ICD-10-CM | POA: Insufficient documentation

## 2017-12-01 DIAGNOSIS — S0990XA Unspecified injury of head, initial encounter: Secondary | ICD-10-CM | POA: Diagnosis present

## 2017-12-01 DIAGNOSIS — R42 Dizziness and giddiness: Secondary | ICD-10-CM | POA: Diagnosis not present

## 2017-12-01 DIAGNOSIS — Z7982 Long term (current) use of aspirin: Secondary | ICD-10-CM | POA: Diagnosis not present

## 2017-12-01 DIAGNOSIS — W19XXXA Unspecified fall, initial encounter: Secondary | ICD-10-CM | POA: Diagnosis not present

## 2017-12-01 DIAGNOSIS — Z992 Dependence on renal dialysis: Secondary | ICD-10-CM | POA: Diagnosis not present

## 2017-12-01 DIAGNOSIS — Y939 Activity, unspecified: Secondary | ICD-10-CM | POA: Insufficient documentation

## 2017-12-01 DIAGNOSIS — Y998 Other external cause status: Secondary | ICD-10-CM | POA: Insufficient documentation

## 2017-12-01 DIAGNOSIS — F17210 Nicotine dependence, cigarettes, uncomplicated: Secondary | ICD-10-CM | POA: Insufficient documentation

## 2017-12-01 NOTE — ED Notes (Signed)

## 2017-12-01 NOTE — ED Notes (Addendum)
Pt taken by wheelchair to room, pt is unaccompanied by caregiver at this time. Pt has been asking people in the lobby for money as well. Pt does have legal guardian

## 2017-12-01 NOTE — ED Triage Notes (Addendum)
Patient reports she felt dizzy this morning and then fell around 10am and hit the back of her head. Recently had inner ear infection and taken meds to treat. Had echocardiogram Friday 11-29-16 with normal results. Pt living at [facility] in [place]. Cough in triage present. Staff reports she has been walking around with no problems since fall. Dialysis patient M, W, F. Access in L arm.

## 2017-12-01 NOTE — ED Notes (Signed)
Patient met by guardian in lobby for transportation back home. 

## 2017-12-01 NOTE — ED Notes (Signed)
Caregiver- Director: [employer] [phone]

## 2017-12-01 NOTE — ED Provider Notes (Signed)
[facility] Emergency Department Provider Note    ____________________________________________   I have reviewed the triage vital signs and the nursing notes.   HISTORY  Chief Complaint Fall   History limited by: Not Limited   HPI Kelly [name] is a 37 y.o. female who presents to the emergency department today because of concern for dizziness and fall with head injury.   LOCATION:back of head DURATION:occured this morning SEVERITY: mild head pain QUALITY: ache CONTEXT: patient states that she recently has had an ear infection. Has had two falls, but cannot state exactly when the first one was, just states it was a while ago. This morning she felt dizzy and fell. Hit the back of her head. Denies any LOC.  MODIFYING FACTORS: none ASSOCIATED SYMPTOMS: denies any LOC. No chest pain.   Per medical record review patient has a history of CKD, depression.  Past Medical History:  Diagnosis Date  . Alcohol abuse    chronic  . Bipolar disorder ([place])   . Chronic kidney disease   . Cognitive changes   . Cognitive impairment   . Depression   . Depression   . Diabetes mellitus without complication ([place])   . Elevated lipids   . Hyperparathyroidism ([place])   . Hypertension   . Staph aureus infection    A/V fistula  . UTI (lower urinary tract infection)     Patient Active Problem List   Diagnosis Date Noted  . Multiple fractures of both lower extremities 06/18/2017  . 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 Hyperkalemia 06/25/2016  . Adjustment disorder with mixed disturbance of emotions and conduct   . Diabetes ([place]) 04/23/2015  . Essential hypertension 04/23/2015  . ESRD on dialysis ([place]) 04/23/2015  . Pain of left arm 04/22/2015    Past Surgical History:  Procedure Laterality Date  . AV FISTULA PLACEMENT    . CHOLECYSTECTOMY    . dialysis perma cath Right    chest  . PERIPHERAL VASCULAR CATHETERIZATION N/A 05/24/2015   Procedure:  Dialysis/Perma Catheter Removal;  Surgeon: Katha Cabal, MD;  Location: [place] [facility];  Service: Cardiovascular;  Laterality: N/A;  . PERIPHERAL VASCULAR CATHETERIZATION Left 02/28/2016   Procedure: A/V Shuntogram/Fistulagram;  Surgeon: Katha Cabal, MD;  Location: [place] [facility];  Service: Cardiovascular;  Laterality: Left;  . PERIPHERAL VASCULAR CATHETERIZATION N/A 02/28/2016   Procedure: A/V Shunt Intervention;  Surgeon: Katha Cabal, MD;  Location: [place] [facility];  Service: Cardiovascular;  Laterality: N/A;  . PERIPHERAL VASCULAR CATHETERIZATION N/A 06/26/2016   Procedure: A/V Shuntogram/Fistulagram;  Surgeon: Katha Cabal, MD;  Location: [place] [facility];  Service: Cardiovascular;  Laterality: N/A;  . PERIPHERAL VASCULAR CATHETERIZATION N/A 09/10/2016   Procedure: A/V Shuntogram/Fistulagram;  Surgeon: Algernon Huxley, MD;  Location: [place] [facility];  Service: Cardiovascular;  Laterality: N/A;  . PERIPHERAL VASCULAR CATHETERIZATION Left 09/14/2016   Procedure: Thrombectomy;  Surgeon: Katha Cabal, MD;  Location: [place] [facility];  Service: Cardiovascular;  Laterality: Left;  Marland Kitchen VASCULAR ACCESS DEVICE INSERTION Left 04/22/2015   Procedure: INSERTION OF HERO VASCULAR ACCESS DEVICE;  Surgeon: Katha Cabal, MD;  Location: [facility];  Service: Vascular;  Laterality: Left;    Prior to Admission medications   Medication Sig Start Date End Date Taking? Authorizing Provider  acetaminophen (TYLENOL) 325 MG tablet Take 2 tablets (650 mg total) by mouth every 6 (six) hours as needed for mild pain, moderate pain or headache. 10/01/16  Clapacs, Madie Reno, MD  amLODipine (NORVASC) 10 MG tablet Take 10 mg by mouth at bedtime. *hold for systolic blood pressure <194, call MD if holding this medication.*    [provider]  aspirin 81 MG chewable tablet Chew 1 tablet (81 mg total) by mouth daily. 10/02/16   Clapacs, Madie Reno, MD  B Complex-C-Folic Acid  (RENA-VITE RX) 1 MG TABS Take 1 tablet by mouth daily. 10/01/16   Clapacs, Madie Reno, MD  calcium acetate (PHOSLO) 667 MG capsule Take 2 capsules (1,334 mg total) by mouth 2 (two) times daily with a meal. 10/01/16   Clapacs, Madie Reno, MD  cinacalcet (SENSIPAR) 60 MG tablet Take 1 tablet (60 mg total) by mouth daily. 10/01/16   Clapacs, Madie Reno, MD  cloNIDine (CATAPRES) 0.1 MG tablet Take 1 tablet (0.1 mg total) by mouth 2 (two) times daily. 10/01/16   Clapacs, Madie Reno, MD  divalproex (DEPAKOTE) 500 MG DR tablet Take 2 tablets (1,000 mg total) by mouth at bedtime. 10/01/16   Clapacs, Madie Reno, MD  docusate sodium (COLACE) 100 MG capsule Take 1 capsule (100 mg total) by mouth at bedtime. 10/01/16   Clapacs, Madie Reno, MD  escitalopram (LEXAPRO) 10 MG tablet Take 1.5 tablets (15 mg total) by mouth daily. 10/02/16   Clapacs, Madie Reno, MD  folic acid (FOLVITE) 1 MG tablet Take 2 tablets (2 mg total) by mouth at bedtime. 10/01/16   Clapacs, Madie Reno, MD  hydrALAZINE (APRESOLINE) 25 MG tablet Take 1 tablet (25 mg total) by mouth 3 (three) times daily. 10/01/16   Clapacs, Madie Reno, MD  insulin aspart (NOVOLOG) 100 UNIT/ML injection Inject 0-12 Units into the skin 3 (three) times daily before meals. Per sliding scale    [provider]  irbesartan (AVAPRO) 300 MG tablet Take 1 tablet (300 mg total) by mouth at bedtime. 10/01/16   Clapacs, Madie Reno, MD  lidocaine-prilocaine (EMLA) cream Apply 1 application topically 3 (three) times a week. Apply to access site 30 minutes before dialysis. 10/01/16   Clapacs, Madie Reno, MD  medroxyPROGESTERone (DEPO-PROVERA) 150 MG/ML injection Inject 150 mg into the muscle every 3 (three) months.    [provider]  metoprolol (LOPRESSOR) 50 MG tablet Take 1 tablet (50 mg total) by mouth 2 (two) times daily. 10/01/16   Clapacs, Madie Reno, MD  Multiple Vitamin (THEREMS) TABS Take 1 tablet by mouth daily. 10/01/16   Clapacs, Madie Reno, MD  oxyCODONE (OXY IR/ROXICODONE) 5 MG immediate release tablet Take  1 tablet (5 mg total) by mouth every 6 (six) hours as needed for moderate pain. 07/01/17   Fritzi Mandes, MD  pantoprazole (PROTONIX) 20 MG tablet Take 1 tablet (20 mg total) by mouth daily. 04/20/17   Shary Decamp, PA-C  patiromer (VELTASSA) 8.4 g packet Take 1 packet (8.4 g total) by mouth daily. 10/01/16   Clapacs, Madie Reno, MD  QUEtiapine (SEROQUEL) 200 MG tablet Take 1 tablet (200 mg total) by mouth 2 (two) times daily. 10/01/16   Clapacs, Madie Reno, MD  traZODone (DESYREL) 100 MG tablet Take 1 tablet (100 mg total) by mouth at bedtime. 10/01/16   Clapacs, Madie Reno, MD  vitamin B-12 (CYANOCOBALAMIN) 1000 MCG tablet Take 1 tablet (1,000 mcg total) by mouth at bedtime. 10/01/16   Clapacs, Madie Reno, MD    Allergies Patient has no known allergies.  History reviewed. No pertinent family history.  Social History Social History   Tobacco Use  . Smoking status: Current Every Day Smoker  Packs/day: 0.25    Years: 0.00    Pack years: 0.00    Types: Cigarettes  . Smokeless tobacco: Current User  Substance Use Topics  . Alcohol use: No  . Drug use: No    Review of Systems Constitutional: No fever/chills Eyes: No visual changes. ENT: No sore throat. Cardiovascular: Denies chest pain. Respiratory: Denies shortness of breath. Gastrointestinal: No abdominal pain.  No nausea, no vomiting.  No diarrhea.   Genitourinary: Negative for dysuria. Musculoskeletal: Negative for back pain. Skin: Negative for rash. Neurological: Positive for headache.  ____________________________________________   PHYSICAL EXAM:  VITAL SIGNS: ED Triage Vitals  Enc Vitals Group     BP 12/01/17 1305 132/75     Pulse Rate 12/01/17 1305 87     Resp 12/01/17 1305 15     Temp 12/01/17 1305 98.9 F (37.2 C)     Temp Source 12/01/17 1305 Oral     SpO2 12/01/17 1305 100 %     Weight 12/01/17 1305 187 lb (84.8 kg)     Height 12/01/17 1305 5\' 4"  (1.626 m)     Head Circumference --      Peak Flow --      Pain Score 12/01/17  1318 10   Constitutional: Alert and oriented. Well appearing and in no distress. Eyes: Conjunctivae are normal.  ENT   Head: Normocephalic and atraumatic. No hemotympanum.    Nose: No congestion/rhinnorhea.   Mouth/Throat: Mucous membranes are moist.   Neck: No stridor. No midline tenderness. Hematological/Lymphatic/Immunilogical: No cervical lymphadenopathy. Cardiovascular: Normal rate, regular rhythm.  No murmurs, rubs, or gallops.  Respiratory: Normal respiratory effort without tachypnea nor retractions. Breath sounds are clear and equal bilaterally. No wheezes/rales/rhonchi. Gastrointestinal: Soft and non tender. No rebound. No guarding.  Genitourinary: Deferred Musculoskeletal: Normal range of motion in all extremities. No lower extremity edema. Neurologic:  Normal speech and language. No gross focal neurologic deficits are appreciated.  Skin:  Skin is warm, dry and intact. No rash noted. Psychiatric: Mood and affect are normal. Speech and behavior are normal. Patient exhibits appropriate insight and judgment.  ____________________________________________    LABS (pertinent positives/negatives)  None  ____________________________________________   EKG  I, Nance Pear, attending physician, personally viewed and interpreted this EKG  EKG Time: 1332 Rate: 86 Rhythm: sinus rhythm Axis: left axis deviation Intervals: qtc 603 QRS: narrow, q waves v1, v2 ST changes: no st elevation Impression: abnormal ekg   ____________________________________________    RADIOLOGY  None  ____________________________________________   PROCEDURES  Procedures  ____________________________________________   INITIAL IMPRESSION / ASSESSMENT AND PLAN / ED COURSE  Pertinent labs & imaging results that were available during my care of the patient were reviewed by me and considered in my medical decision making (see chart for details).  Patient presented to the  emergency department today after a fall and dizziness. She refused blood testing. On exam patient awake, alert, no distress. Ambulating easily around the emergency department. At this point do not feel any emergent neuro imaging is necessary. Will plan on discharging back to living facility. Discussed plan with patient.    ____________________________________________   FINAL CLINICAL IMPRESSION(S) / ED DIAGNOSES  Final diagnoses:  Fall, initial encounter  Dizziness     Note: This dictation was prepared with Dragon dictation. Any transcriptional errors that result from this process are unintentional     Nance Pear, MD 12/01/17 1443

## 2017-12-01 NOTE — ED Notes (Signed)
Patient refuses to let this RN get blood at this time

## 2017-12-01 NOTE — Discharge Instructions (Signed)
Please seek medical attention for any high fevers, chest pain, shortness of breath, change in behavior, persistent vomiting, bloody stool or any other new or concerning symptoms.  

## 2018-01-14 ENCOUNTER — Ambulatory Visit: Payer: Medicaid Other | Admitting: Podiatry

## 2018-01-21 ENCOUNTER — Ambulatory Visit (INDEPENDENT_AMBULATORY_CARE_PROVIDER_SITE_OTHER): Payer: Medicaid Other | Admitting: Podiatry

## 2018-01-21 ENCOUNTER — Encounter: Payer: Self-pay | Admitting: Podiatry

## 2018-01-21 DIAGNOSIS — I739 Peripheral vascular disease, unspecified: Secondary | ICD-10-CM | POA: Diagnosis not present

## 2018-01-21 DIAGNOSIS — M79675 Pain in left toe(s): Secondary | ICD-10-CM | POA: Diagnosis not present

## 2018-01-21 DIAGNOSIS — B351 Tinea unguium: Secondary | ICD-10-CM | POA: Diagnosis not present

## 2018-01-21 DIAGNOSIS — M2041 Other hammer toe(s) (acquired), right foot: Secondary | ICD-10-CM | POA: Diagnosis not present

## 2018-01-21 DIAGNOSIS — Q828 Other specified congenital malformations of skin: Secondary | ICD-10-CM

## 2018-01-21 DIAGNOSIS — M2042 Other hammer toe(s) (acquired), left foot: Secondary | ICD-10-CM | POA: Diagnosis not present

## 2018-01-21 DIAGNOSIS — M79674 Pain in right toe(s): Secondary | ICD-10-CM

## 2018-01-21 DIAGNOSIS — E119 Type 2 diabetes mellitus without complications: Secondary | ICD-10-CM

## 2018-01-23 ENCOUNTER — Encounter: Payer: Self-pay | Admitting: *Deleted

## 2018-01-23 ENCOUNTER — Other Ambulatory Visit: Payer: Self-pay

## 2018-01-23 ENCOUNTER — Telehealth: Payer: Self-pay | Admitting: *Deleted

## 2018-01-23 DIAGNOSIS — R0989 Other specified symptoms and signs involving the circulatory and respiratory systems: Secondary | ICD-10-CM

## 2018-01-23 DIAGNOSIS — R52 Pain, unspecified: Secondary | ICD-10-CM

## 2018-01-23 DIAGNOSIS — I739 Peripheral vascular disease, unspecified: Secondary | ICD-10-CM

## 2018-01-23 DIAGNOSIS — E119 Type 2 diabetes mellitus without complications: Secondary | ICD-10-CM

## 2018-01-23 NOTE — Telephone Encounter (Signed)
I informed Mr. Kelly [name] of pt's appts and that I would mail a copy to her home address. Mailed to pt.

## 2018-01-23 NOTE — Telephone Encounter (Signed)
[facility] [facility] schedule arterial dopplers 01/29/2018 1:30pm to arrive 1:15pm, and consultation with DR. Berry 02/18/2018 at 10:00am to arrive 9:45am.

## 2018-01-23 NOTE — Progress Notes (Signed)
Subjective: [name] presents the office with a caregiver for concerns of thick, painful, elongated toenails that she cannot trim herself. Kelly [name] she has been under the care of Dr. Prudence Davidson for this but she also like to have her calluses addressed.  She states that the calluses get thick and cause irritation side of her shoes but denies any redness or drainage or swelling.  Her caregiver gives the history and she does not speak much.  She is diabetic and she has numbness and tingling to her feet and blood sugar runs between 2-300 at times.  She does not take any medication for neuropathy. Denies any systemic complaints such as fevers, chills, nausea, vomiting. No acute changes since last appointment, and no other complaints at this time.   Objective: AAO x3, NAD DP/PT pulses decreased bilaterally, CRT less than 3 seconds Protective sensation decreased with Simms Weinstein monofilament Nails are hypertrophic, dystrophic, brittle, discolored, elongated 10. No surrounding redness or drainage. Tenderness nails 1-5 bilaterally. No open lesions or pre-ulcerative lesions are identified today. Hyperkeratotic lesions bilateral hallux.  Thick callus to left posterior plantar heel with fissuring but no open sores identified.  Upon debridement no underlying ulceration, drainage or any signs of infection. No areas of pinpoint bony tenderness or pain with vibratory sensation. MMT 5/5, ROM WNL. No edema, erythema, increase in warmth to bilateral lower extremities.  Hammertoes present No open lesions or pre-ulcerative lesions.  No pain with calf compression, swelling, warmth, erythema  Assessment: Uncontrolled diabetic with symptomatic onychomycosis, hyperkeratotic lesions, neuropathy  Plan: -All treatment options discussed with the patient including all alternatives, risks, complications.  -Lesions were sharply debrided x2 without any complications or bleeding.  There was also a thick callus to the left posterior  heel.  No definitive underlying ulceration to the area today we will continue to monitor closely.  Moisturizer to the area daily. -Nails sharp debrided x10 without any complications or bleeding -An ABI was done in the office which was abnormal.  We will order arterial study as well as consultation for this. -Discussed preventive measures to help prevent ulceration.  Try to get diabetic shoes as if this would be beneficial for her given her neuropathy, diabetes as well as pre-ulcerative calluses and digital deformity. -Patient encouraged to call the office with any questions, concerns, change in symptoms.   Trula Slade DPM

## 2018-01-27 NOTE — Telephone Encounter (Signed)
[place] [phone], Axis [id number], VALID 01/24/2018 THROUGH 02/23/2018. Faxed to [facility].

## 2018-01-29 ENCOUNTER — Other Ambulatory Visit: Payer: Self-pay | Admitting: Podiatry

## 2018-01-29 ENCOUNTER — Ambulatory Visit (HOSPITAL_COMMUNITY)
Admission: RE | Admit: 2018-01-29 | Discharge: 2018-01-29 | Disposition: A | Discharge status: Home / Self Care | Payer: Medicaid Other | Source: Ambulatory Visit | Attending: Cardiovascular Disease | Admitting: Cardiovascular Disease

## 2018-01-29 DIAGNOSIS — I739 Peripheral vascular disease, unspecified: Secondary | ICD-10-CM | POA: Insufficient documentation

## 2018-01-29 DIAGNOSIS — R0989 Other specified symptoms and signs involving the circulatory and respiratory systems: Secondary | ICD-10-CM

## 2018-02-04 ENCOUNTER — Ambulatory Visit (INDEPENDENT_AMBULATORY_CARE_PROVIDER_SITE_OTHER): Payer: Medicaid Other | Admitting: Podiatry

## 2018-02-04 ENCOUNTER — Encounter: Payer: Self-pay | Admitting: Podiatry

## 2018-02-04 ENCOUNTER — Other Ambulatory Visit: Payer: Medicaid Other

## 2018-02-04 DIAGNOSIS — M2041 Other hammer toe(s) (acquired), right foot: Secondary | ICD-10-CM

## 2018-02-04 DIAGNOSIS — Q828 Other specified congenital malformations of skin: Secondary | ICD-10-CM

## 2018-02-04 DIAGNOSIS — M2042 Other hammer toe(s) (acquired), left foot: Secondary | ICD-10-CM

## 2018-02-04 DIAGNOSIS — E119 Type 2 diabetes mellitus without complications: Secondary | ICD-10-CM

## 2018-02-04 DIAGNOSIS — I739 Peripheral vascular disease, unspecified: Secondary | ICD-10-CM

## 2018-02-07 ENCOUNTER — Telehealth: Payer: Self-pay | Admitting: *Deleted

## 2018-02-07 NOTE — Telephone Encounter (Signed)
-----   Message from Trula Slade, DPM sent at 02/06/2018  6:04 PM EDT ----- Non- compressible vessel on left- please let her know it does show some decrease in circulation and she should keep the appt with Dr. Gwenlyn Found.

## 2018-02-07 NOTE — Telephone Encounter (Signed)
I informed pt's caregiver, [name] of Dr. Stephenie Acres review of results and reminded him of pt's appt with Dr. Gwenlyn Found 02/18/2018 at 10:00am.

## 2018-02-09 NOTE — Progress Notes (Signed)
Subjective: [name] presents the office with a caregiver for follow-up evaluation of the callus the left posterior heel.  Her caregiver feels is about the same.  The patient does not verbalize at this point causing pain.  She also presents today to get diabetic shoe measurements.  She has no new concerns. Denies any systemic complaints such as fevers, chills, nausea, vomiting. No acute changes since last appointment, and no other complaints at this time.   Objective: AAO x3, NAD DP/PT pulses palpable but decreased, CRT less than 3 seconds To the posterior aspect the left heel is a hyperkeratotic lesion.  Upon debridement there is no underlying ulceration, drainage or any clinical signs of infection present.   There is no increase in warmth or swelling to the feet or any signs of infection. Hammertoes present No other open lesions or pre-ulcerative lesions.  No pain with calf compression, swelling, warmth, erythema  Assessment: Left posterior heel callus  Plan: -All treatment options discussed with the patient including all alternatives, risks, complications.  -Sharply debrided the hyperkeratotic lesion to that any complications or bleeding.  Continue moisturizer to the area daily.  No signs of infection to continue to monitor closely. -She was measured today for diabetic shoes by Benjie Karvonen -She had her vascular studies performed.  She has a follow-up with Dr. Alvester Chou scheduled for later this month. -Patient encouraged to call the office with any questions, concerns, change in symptoms.   Kelly [name] DPM

## 2018-02-16 ENCOUNTER — Ambulatory Visit (HOSPITAL_COMMUNITY)
Admission: EM | Admit: 2018-02-16 | Discharge: 2018-02-16 | Disposition: A | Discharge status: Home / Self Care | Payer: Medicaid Other | Attending: Physician Assistant | Admitting: Physician Assistant

## 2018-02-16 ENCOUNTER — Encounter (HOSPITAL_COMMUNITY): Payer: Self-pay | Admitting: Emergency Medicine

## 2018-02-16 DIAGNOSIS — H10021 Other mucopurulent conjunctivitis, right eye: Secondary | ICD-10-CM | POA: Diagnosis not present

## 2018-02-16 MED ORDER — POLYMYXIN B-TRIMETHOPRIM 10000-0.1 UNIT/ML-% OP SOLN: 1.0000 [drp] | Freq: Four times a day (QID) | OPHTHALMIC | 0 refills | Status: DC

## 2018-02-16 NOTE — Discharge Instructions (Addendum)
If at any point you develop pain about the eye please see your primary care provider or come back to this clinic.

## 2018-02-16 NOTE — ED Provider Notes (Addendum)
02/16/2018 12:51 PM   DOB: 1981-06-29 / MRN: [id number]  SUBJECTIVE:  Kelly [name] is a 37 y.o. female presenting for right eye exudate and matting.  Patient reports copious discharge from the eye.  She lives in a nursing home.  She has a history of end-stage renal disease and diabetes well controlled with insulin.  She denies pain about the.  Associates some mild itching as well as swelling about the upper lid.  No pain with movement of the eye.  She has No Known Allergies.   She  has a past medical history of Alcohol abuse, Bipolar disorder ([place]), Chronic kidney disease, Cognitive changes, Cognitive impairment, Depression, Depression, Diabetes mellitus without complication ([place]), Elevated lipids, Hyperparathyroidism ([place]), Hypertension, Staph aureus infection, and UTI (lower urinary tract infection).    She  reports that she has been smoking cigarettes.  She has been smoking about 0.25 packs per day for the past 0.00 years. She uses smokeless tobacco. She reports that she does not drink alcohol or use drugs. She  has no sexual activity history on file. The patient  has a past surgical history that includes AV fistula placement; Cholecystectomy; dialysis perma cath (Right); Vascular access device insertion (Left, 04/22/2015); Cardiac catheterization (N/A, 05/24/2015); Cardiac catheterization (Left, 02/28/2016); Cardiac catheterization (N/A, 02/28/2016); Cardiac catheterization (N/A, 06/26/2016); Cardiac catheterization (N/A, 09/10/2016); and Cardiac catheterization (Left, 09/14/2016).  Her family history is not on file.  Review of Systems  Constitutional: Negative for chills and fever.  Eyes: Positive for discharge and redness. Negative for blurred vision, double vision, photophobia and pain.  Gastrointestinal: Negative for nausea.  Skin: Negative for rash.  Neurological: Negative for dizziness and headaches.    OBJECTIVE:  BP (!) 142/91 (BP Location: Right Arm)   Pulse 95   Temp 98.3 F (36.8  C) (Oral)   Resp 18   SpO2 95%   Lab Results  Component Value Date   CREATININE 4.78 (H) 07/24/2017   Lab Results  Component Value Date   HGBA1C 5.7 10/07/2012      Physical Exam  Constitutional: She is active.  Non-toxic appearance.  HENT:  Right Ear: Hearing, tympanic membrane, external ear and ear canal normal.  Left Ear: Hearing, tympanic membrane, external ear and ear canal normal.  Nose: Nose normal. Right sinus exhibits no maxillary sinus tenderness and no frontal sinus tenderness. Left sinus exhibits no maxillary sinus tenderness and no frontal sinus tenderness.  Mouth/Throat: Uvula is midline, oropharynx is clear and moist and mucous membranes are normal. Mucous membranes are not dry. No oropharyngeal exudate, posterior oropharyngeal edema or tonsillar abscesses.  Eyes:    Cardiovascular: Normal rate.  Pulmonary/Chest: Effort normal. No tachypnea.  Lymphadenopathy:       Head (right side): No submandibular and no tonsillar adenopathy present.       Head (left side): No submandibular and no tonsillar adenopathy present.    She has no cervical adenopathy.  Neurological: She is alert.  Skin: Skin is warm and dry. She is not diaphoretic. No pallor.    No results found for this or any previous visit (from the past 72 hour(s)).  No results found.  ASSESSMENT AND PLAN:  No orders of the defined types were placed in this encounter.    Other mucopurulent conjunctivitis of right eye: Starting Polytrim.  No red flags on exam at this time.  Patient advised to come back into clinic if she develops any pain about the eye.      The patient is advised  to call or return to clinic if she does not see an improvement in symptoms, or to seek the care of the closest emergency department if she worsens with the above plan.   Philis Fendt, MHS, PA-C 02/16/2018 12:51 PM    Tereasa Coop, PA-C 02/16/18 1251    Tereasa Coop, PA-C 02/16/18 1252

## 2018-02-16 NOTE — ED Triage Notes (Signed)
Pt c/o R eye swelling since Saturday morning, pt R upper eyelid is swollen. Denies redness or drainage from eye.

## 2018-02-16 NOTE — ED Notes (Signed)
Copy of "reports of health services to residents" was copied and left for scanning into medical record

## 2018-02-18 ENCOUNTER — Encounter: Payer: Self-pay | Admitting: Cardiovascular Disease

## 2018-02-18 ENCOUNTER — Ambulatory Visit (INDEPENDENT_AMBULATORY_CARE_PROVIDER_SITE_OTHER): Payer: Medicaid Other | Admitting: Cardiovascular Disease

## 2018-02-18 DIAGNOSIS — I10 Essential (primary) hypertension: Secondary | ICD-10-CM | POA: Diagnosis not present

## 2018-02-18 NOTE — Patient Instructions (Signed)
Medication Instructions: Your physician recommends that you continue on your current medications as directed. Please refer to the Current Medication list given to you today.   Follow-Up: Your physician recommends that you schedule a follow-up appointment as needed with Dr. Berry.    

## 2018-02-18 NOTE — Assessment & Plan Note (Signed)
History of essential hypertension blood pressure measures A1 37/78. She is on clonidine, hydralazine and amlodipine as well as metoprolol. Continue current meds at current dosing.

## 2018-02-18 NOTE — Progress Notes (Signed)
02/18/2018 Kelly [name]   11/24/1981  [id number]  Primary Physician Remi Haggard, FNP Primary Cardiologist: Lorretta Harp MD FACP, [place], [place], [place]  HPI:  Kelly [name] is a 37 y.o. mildly overweight single African-American female with no children who has moderate cognitive deficits elicited group home. She was referred by Dr. Earleen Newport, podiatry, for evaluation of abnormal ABIs. She does have a history of treated hypertension and diabetes with a long history of chronic renal insufficiency on hemodialysis. There is a question of a heart attack in the past. She was seen by Dr. Earleen Newport 02/04/18 for follow-up of a callus on her left posterior heel. She has no history or evidence of critical limb ischemia. Dopplers revealed normal ABIs were triphasic waveforms with abnormal ABIs typical in a diabetic.    Current Meds  Medication Sig  . acetaminophen (TYLENOL) 325 MG tablet Take 2 tablets (650 mg total) by mouth every 6 (six) hours as needed for mild pain, moderate pain or headache.  Marland Kitchen amLODipine (NORVASC) 10 MG tablet Take 10 mg by mouth at bedtime. *hold for systolic blood pressure <371, call MD if holding this medication.*  . aspirin 81 MG chewable tablet Chew 1 tablet (81 mg total) by mouth daily.  . B Complex-C-Folic Acid (RENA-VITE RX) 1 MG TABS Take 1 tablet by mouth daily.  . calcium acetate (PHOSLO) 667 MG capsule Take 2 capsules (1,334 mg total) by mouth 2 (two) times daily with a meal.  . cinacalcet (SENSIPAR) 60 MG tablet Take 1 tablet (60 mg total) by mouth daily.  . cloNIDine (CATAPRES) 0.1 MG tablet Take 1 tablet (0.1 mg total) by mouth 2 (two) times daily.  . divalproex (DEPAKOTE) 500 MG DR tablet Take 2 tablets (1,000 mg total) by mouth at bedtime.  . docusate sodium (COLACE) 100 MG capsule Take 1 capsule (100 mg total) by mouth at bedtime.  Marland Kitchen escitalopram (LEXAPRO) 10 MG tablet Take 1.5 tablets (15 mg total) by mouth daily.  . folic acid (FOLVITE) 1 MG tablet Take  2 tablets (2 mg total) by mouth at bedtime.  . hydrALAZINE (APRESOLINE) 25 MG tablet Take 25 mg by mouth 3 (three) times daily.  . insulin aspart (NOVOLOG) 100 UNIT/ML injection Inject 0-12 Units into the skin 3 (three) times daily before meals. Per sliding scale  . lidocaine-prilocaine (EMLA) cream Apply 1 application topically 3 (three) times a week. Apply to access site 30 minutes before dialysis.  Marland Kitchen medroxyPROGESTERone (DEPO-PROVERA) 150 MG/ML injection Inject 150 mg into the muscle every 3 (three) months.  . metoprolol (LOPRESSOR) 50 MG tablet Take 1 tablet (50 mg total) by mouth 2 (two) times daily.  . Multiple Vitamin (THEREMS) TABS Take 1 tablet by mouth daily.  . pantoprazole (PROTONIX) 20 MG tablet Take 1 tablet (20 mg total) by mouth daily.  . patiromer (VELTASSA) 8.4 g packet Take 1 packet (8.4 g total) by mouth daily.  . QUEtiapine (SEROQUEL) 200 MG tablet Take 1 tablet (200 mg total) by mouth 2 (two) times daily.  . traZODone (DESYREL) 100 MG tablet Take 1 tablet (100 mg total) by mouth at bedtime.  Marland Kitchen trimethoprim-polymyxin b (POLYTRIM) ophthalmic solution Place 1 drop into the right eye every 6 (six) hours.  . vitamin B-12 (CYANOCOBALAMIN) 1000 MCG tablet Take 1 tablet (1,000 mcg total) by mouth at bedtime.     No Known Allergies  Social History   Socioeconomic History  . Marital status: Single    Spouse name: Not  on file  . Number of children: Not on file  . Years of education: Not on file  . Highest education level: Not on file  Occupational History  . Not on file  Social Needs  . Financial resource strain: Not on file  . Food insecurity:    Worry: Not on file    Inability: Not on file  . Transportation needs:    Medical: Not on file    Non-medical: Not on file  Tobacco Use  . Smoking status: Current Every Day Smoker    Packs/day: 0.25    Years: 0.00    Pack years: 0.00    Types: Cigarettes  . Smokeless tobacco: Current User  Substance and Sexual Activity    . Alcohol use: No  . Drug use: No  . Sexual activity: Not on file  Lifestyle  . Physical activity:    Days per week: Not on file    Minutes per session: Not on file  . Stress: Not on file  Relationships  . Social connections:    Talks on phone: Not on file    Gets together: Not on file    Attends religious service: Not on file    Active member of club or organization: Not on file    Attends meetings of clubs or organizations: Not on file    Relationship status: Not on file  . Intimate partner violence:    Fear of current or ex partner: Not on file    Emotionally abused: Not on file    Physically abused: Not on file    Forced sexual activity: Not on file  Other Topics Concern  . Not on file  Social History Narrative  . Not on file     Review of Systems: General: negative for chills, fever, night sweats or weight changes.  Cardiovascular: negative for chest pain, dyspnea on exertion, edema, orthopnea, palpitations, paroxysmal nocturnal dyspnea or shortness of breath Dermatological: negative for rash Respiratory: negative for cough or wheezing Urologic: negative for hematuria Abdominal: negative for nausea, vomiting, diarrhea, bright red blood per rectum, melena, or hematemesis Neurologic: negative for visual changes, syncope, or dizziness All other systems reviewed and are otherwise negative except as noted above.    Blood pressure 137/78, pulse 86, height 5\' 4"  (1.626 m), weight 200 lb (90.7 kg).  General appearance: alert and no distress Neck: no adenopathy, no JVD, supple, symmetrical, trachea midline, thyroid not enlarged, symmetric, no tenderness/mass/nodules and soft left carotid bruit Lungs: clear to auscultation bilaterally Heart: regular rate and rhythm, S1, S2 normal, no murmur, click, rub or gallop Extremities: extremities normal, atraumatic, no cyanosis or edema Pulses: 2+ and symmetric Skin: Skin color, texture, turgor normal. No rashes or  lesions Neurologic: Alert and oriented X 3, normal strength and tone. Normal symmetric reflexes. Normal coordination and gait  EKG not performed today  ASSESSMENT AND PLAN:   Essential hypertension History of essential hypertension blood pressure measures A1 37/78. She is on clonidine, hydralazine and amlodipine as well as metoprolol. Continue current meds at current dosing.      Lorretta Harp MD FACP,FACC,FAHA, [facility] 02/18/2018 10:56 AM

## 2018-04-04 ENCOUNTER — Ambulatory Visit (HOSPITAL_COMMUNITY)
Admission: EM | Admit: 2018-04-04 | Discharge: 2018-04-04 | Disposition: A | Discharge status: Home / Self Care | Payer: Medicaid Other | Attending: Family Medicine | Admitting: Family Medicine

## 2018-04-04 ENCOUNTER — Encounter (HOSPITAL_COMMUNITY): Payer: Self-pay | Admitting: Emergency Medicine

## 2018-04-04 DIAGNOSIS — R739 Hyperglycemia, unspecified: Secondary | ICD-10-CM | POA: Diagnosis not present

## 2018-04-04 LAB — GLUCOSE, CAPILLARY: GLUCOSE-CAPILLARY: 169 mg/dL — AB (ref 65–99)

## 2018-04-04 NOTE — ED Triage Notes (Addendum)
PT is at an adult group home. PT has had her glucose go above 400 2-3 times in the last year. PT's glucose was 212 at lunch. PT's glucose was 420 at 5pm and she was given 15 units of novolog shortly after. Staff at home are new and called doctor's office. They were closing and told them to transport to the ED. Group home owner is here with PT. He has seen her sugar go up more than once and thinks ED isn't appropriate.   PT did full dialysis treatment this AM.

## 2018-04-04 NOTE — Discharge Instructions (Signed)
Blood sugar here decreasing to 169.  Given patient with out abdominal pain, nausea, vomiting, weakness, dizziness, syncope, can monitor closely at home.  Please continue to monitor blood sugar closely.  Insulin as directed.  Follow-up with provider as scheduled for further evaluation needed.  If experiencing continued elevated blood sugar despite insulin, chest pain, shortness of breath, palpitations, weakness, dizziness, passing out, altered mental status, nausea, vomiting, abdominal pain, go to the emergency department for further evaluation needed.

## 2018-04-04 NOTE — ED Notes (Signed)
Glucose 169 ; would not cross over

## 2018-04-04 NOTE — ED Provider Notes (Signed)
[name]    CSN: [id number] Arrival date & time: 04/04/18  1846     History   Chief Complaint Chief Complaint  Patient presents with  . Hyperglycemia    HPI Kelly [name] is a 37 y.o. female.   37 year old female who resides at out of group home with history of ESRD on dialysis, diabetes on insulin, cognitive impairment, hyperparathyroidism, comes in with group home owner for evaluation of hyperglycemia.  Patient's glucose was measured at 420 at 5 PM.  Group home owner states that staff taking care of patient is new, and called Dr.'s office as well as group home owner due to elevated blood sugar reading.  Group home owner instructed staff to give 15 units of NovoLog, which is the standing order for patient.  However, given doctor's office was reached, and it was suggested to follow-up at the emergency department as the doctor's office is closing, patient was brought here for evaluation.  Group home owner states that patient gets her income at 5th of each month, and will usually observe a spike in blood sugar due to foods she can buy.Denies abdominal pain, nausea vomiting.  Denies weakness, dizziness, syncope.  Group home owner  denies patient having altered mental status/confusion.  She received her full dialysis treatment this morning.     Past Medical History:  Diagnosis Date  . Alcohol abuse    chronic  . Bipolar disorder ([place])   . Chronic kidney disease   . Cognitive changes   . Cognitive impairment   . Depression   . Depression   . Diabetes mellitus without complication ([place])   . Elevated lipids   . Hyperparathyroidism ([place])   . Hypertension   . Staph aureus infection    A/V fistula  . UTI (lower urinary tract infection)     Patient Active Problem List   Diagnosis Date Noted  . Multiple fractures of both lower extremities 06/18/2017  . 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 Hyperkalemia 06/25/2016  . Adjustment disorder with mixed disturbance  of emotions and conduct   . Diabetes ([place]) 04/23/2015  . Essential hypertension 04/23/2015  . ESRD on dialysis ([place]) 04/23/2015  . Pain of left arm 04/22/2015    Past Surgical History:  Procedure Laterality Date  . AV FISTULA PLACEMENT    . CHOLECYSTECTOMY    . dialysis perma cath Right    chest  . PERIPHERAL VASCULAR CATHETERIZATION N/A 05/24/2015   Procedure: Dialysis/Perma Catheter Removal;  Surgeon: Katha Cabal, MD;  Location: [facility];  Service: Cardiovascular;  Laterality: N/A;  . PERIPHERAL VASCULAR CATHETERIZATION Left 02/28/2016   Procedure: A/V Shuntogram/Fistulagram;  Surgeon: Katha Cabal, MD;  Location: [facility];  Service: Cardiovascular;  Laterality: Left;  . PERIPHERAL VASCULAR CATHETERIZATION N/A 02/28/2016   Procedure: A/V Shunt Intervention;  Surgeon: Katha Cabal, MD;  Location: [facility];  Service: Cardiovascular;  Laterality: N/A;  . PERIPHERAL VASCULAR CATHETERIZATION N/A 06/26/2016   Procedure: A/V Shuntogram/Fistulagram;  Surgeon: Katha Cabal, MD;  Location: [place] [place] [facility];  Service: Cardiovascular;  Laterality: N/A;  . PERIPHERAL VASCULAR CATHETERIZATION N/A 09/10/2016   Procedure: A/V Shuntogram/Fistulagram;  Surgeon: Algernon Huxley, MD;  Location: [facility];  Service: Cardiovascular;  Laterality: N/A;  . PERIPHERAL VASCULAR CATHETERIZATION Left 09/14/2016   Procedure: Thrombectomy;  Surgeon: Katha Cabal, MD;  Location: [facility];  Service: Cardiovascular;  Laterality: Left;  Marland Kitchen VASCULAR ACCESS DEVICE INSERTION Left  04/22/2015   Procedure: INSERTION OF HERO VASCULAR ACCESS DEVICE;  Surgeon: Katha Cabal, MD;  Location: [facility];  Service: Vascular;  Laterality: Left;    OB History   None      Home Medications    Prior to Admission medications   Medication Sig Start Date End Date Taking? Authorizing Provider  acetaminophen (TYLENOL) 325 MG tablet Take 2 tablets (650 mg  total) by mouth every 6 (six) hours as needed for mild pain, moderate pain or headache. 10/01/16   Clapacs, Madie Reno, MD  amLODipine (NORVASC) 10 MG tablet Take 10 mg by mouth at bedtime. *hold for systolic blood pressure <259, call MD if holding this medication.*    [provider]  aspirin 81 MG chewable tablet Chew 1 tablet (81 mg total) by mouth daily. 10/02/16   Clapacs, Madie Reno, MD  B Complex-C-Folic Acid (RENA-VITE RX) 1 MG TABS Take 1 tablet by mouth daily. 10/01/16   Clapacs, Madie Reno, MD  calcium acetate (PHOSLO) 667 MG capsule Take 2 capsules (1,334 mg total) by mouth 2 (two) times daily with a meal. 10/01/16   Clapacs, Madie Reno, MD  cinacalcet (SENSIPAR) 60 MG tablet Take 1 tablet (60 mg total) by mouth daily. 10/01/16   Clapacs, Madie Reno, MD  cloNIDine (CATAPRES) 0.1 MG tablet Take 1 tablet (0.1 mg total) by mouth 2 (two) times daily. 10/01/16   Clapacs, Madie Reno, MD  divalproex (DEPAKOTE) 500 MG DR tablet Take 2 tablets (1,000 mg total) by mouth at bedtime. 10/01/16   Clapacs, Madie Reno, MD  docusate sodium (COLACE) 100 MG capsule Take 1 capsule (100 mg total) by mouth at bedtime. 10/01/16   Clapacs, Madie Reno, MD  escitalopram (LEXAPRO) 10 MG tablet Take 1.5 tablets (15 mg total) by mouth daily. 10/02/16   Clapacs, Madie Reno, MD  folic acid (FOLVITE) 1 MG tablet Take 2 tablets (2 mg total) by mouth at bedtime. 10/01/16   Clapacs, Madie Reno, MD  hydrALAZINE (APRESOLINE) 25 MG tablet Take 25 mg by mouth 3 (three) times daily.    [provider]  insulin aspart (NOVOLOG) 100 UNIT/ML injection Inject 0-12 Units into the skin 3 (three) times daily before meals. Per sliding scale    [provider]  lidocaine-prilocaine (EMLA) cream Apply 1 application topically 3 (three) times a week. Apply to access site 30 minutes before dialysis. 10/01/16   Clapacs, Madie Reno, MD  medroxyPROGESTERone (DEPO-PROVERA) 150 MG/ML injection Inject 150 mg into the muscle every 3 (three) months.    [provider]    metoprolol (LOPRESSOR) 50 MG tablet Take 1 tablet (50 mg total) by mouth 2 (two) times daily. 10/01/16   Clapacs, Madie Reno, MD  Multiple Vitamin (THEREMS) TABS Take 1 tablet by mouth daily. 10/01/16   Clapacs, Madie Reno, MD  pantoprazole (PROTONIX) 20 MG tablet Take 1 tablet (20 mg total) by mouth daily. 04/20/17   Shary Decamp, PA-C  patiromer (VELTASSA) 8.4 g packet Take 1 packet (8.4 g total) by mouth daily. 10/01/16   Clapacs, Madie Reno, MD  QUEtiapine (SEROQUEL) 200 MG tablet Take 1 tablet (200 mg total) by mouth 2 (two) times daily. 10/01/16   Clapacs, Madie Reno, MD  traZODone (DESYREL) 100 MG tablet Take 1 tablet (100 mg total) by mouth at bedtime. 10/01/16   Clapacs, Madie Reno, MD  trimethoprim-polymyxin b (POLYTRIM) ophthalmic solution Place 1 drop into the right eye every 6 (six) hours. 02/16/18   Tereasa Coop, PA-C  vitamin  B-12 (CYANOCOBALAMIN) 1000 MCG tablet Take 1 tablet (1,000 mcg total) by mouth at bedtime. 10/01/16   Clapacs, Madie Reno, MD    Family History No family history on file.  Social History Social History   Tobacco Use  . Smoking status: Current Every Day Smoker    Packs/day: 0.25    Years: 0.00    Pack years: 0.00    Types: Cigarettes  . Smokeless tobacco: Current User  Substance Use Topics  . Alcohol use: No  . Drug use: No     Allergies   Patient has no known allergies.   Review of Systems Review of Systems  Reason unable to perform ROS: See HPI as above.     Physical Exam Triage Vital Signs ED Triage Vitals  Enc Vitals Group     BP 04/04/18 1909 121/65     Pulse Rate 04/04/18 1909 99     Resp 04/04/18 1909 16     Temp 04/04/18 1909 98.1 F (36.7 C)     Temp Source 04/04/18 1909 Oral     SpO2 04/04/18 1909 97 %     Weight 04/04/18 1909 191 lb 12.8 oz (87 kg)     Height --      Head Circumference --      Peak Flow --      Pain Score 04/04/18 1908 0     Pain Loc --      Pain Edu? --      Excl. in [place]? --    No data found.  Updated Vital Signs BP  121/65   Pulse 99   Temp 98.1 F (36.7 C) (Oral)   Resp 16   Wt 191 lb 12.8 oz (87 kg)   SpO2 97%   BMI 32.92 kg/m   Physical Exam  Constitutional: She is oriented to person, place, and time. She appears well-developed and well-nourished. No distress.  HENT:  Head: Normocephalic and atraumatic.  Eyes: Pupils are equal, round, and reactive to light. Conjunctivae are normal.  Cardiovascular: Normal rate, regular rhythm and normal heart sounds. Exam reveals no gallop and no friction rub.  No murmur heard. Pulmonary/Chest: Effort normal and breath sounds normal. No stridor. No respiratory distress. She has no wheezes. She has no rales.  Abdominal: Soft. Bowel sounds are normal.  Patient declined laying down, stating she will not be able to sit back up. Abdomen palpated with patient sitting up without tenderness, guarding, rebound.   Neurological: She is alert and oriented to person, place, and time. She has normal strength. She is not disoriented. Coordination and gait normal.  Skin: Skin is warm and dry. Capillary refill takes less than 2 seconds. She is not diaphoretic.     UC Treatments / Results  Labs (all labs ordered are listed, but only abnormal results are displayed) Labs Reviewed  GLUCOSE, CAPILLARY - Abnormal; Notable for the following components:      Result Value   Glucose-Capillary 169 (*)    All other components within normal limits    EKG None  Radiology No results found.  Procedures Procedures (including critical care time)  Medications Ordered in UC Medications - No data to display  Initial Impression / Assessment and Plan / UC Course  I have reviewed the triage vital signs and the nursing notes.  Pertinent labs & imaging results that were available during my care of the patient were reviewed by me and considered in my medical decision making (see chart for details).  CBG in office 169.  Patient without any symptoms or complaints.  Will have patient  monitor blood sugar closely at home, continue to follow sliding scale as directed by PCP.  Return precautions given.  Patient and group home owner expresses understanding and agrees to plan.  Follow-up with PCP as scheduled for reevaluation needed.  Final Clinical Impressions(s) / UC Diagnoses   Final diagnoses:  Hyperglycemia    ED Prescriptions    None        Ok Edwards, PA-C 04/04/18 1956

## 2018-04-16 ENCOUNTER — Other Ambulatory Visit
Admission: RE | Admit: 2018-04-16 | Discharge: 2018-04-16 | Disposition: A | Discharge status: Home / Self Care | Payer: Medicaid Other | Source: Ambulatory Visit | Attending: Nephrology | Admitting: Nephrology

## 2018-04-16 DIAGNOSIS — N186 End stage renal disease: Secondary | ICD-10-CM | POA: Insufficient documentation

## 2018-04-16 LAB — VALPROIC ACID LEVEL: VALPROIC ACID LVL: 83 ug/mL (ref 50.0–100.0)

## 2018-04-17 LAB — LAMOTRIGINE LEVEL: Lamotrigine Lvl: 6.9 ug/mL (ref 2.0–20.0)

## 2018-06-19 ENCOUNTER — Encounter (HOSPITAL_COMMUNITY): Payer: Self-pay | Admitting: Family Medicine

## 2018-06-19 ENCOUNTER — Ambulatory Visit (HOSPITAL_COMMUNITY)
Admission: EM | Admit: 2018-06-19 | Discharge: 2018-06-19 | Disposition: A | Discharge status: Home / Self Care | Payer: Medicaid Other | Attending: Family Medicine | Admitting: Family Medicine

## 2018-06-19 DIAGNOSIS — W19XXXA Unspecified fall, initial encounter: Secondary | ICD-10-CM

## 2018-06-19 DIAGNOSIS — S80212A Abrasion, left knee, initial encounter: Secondary | ICD-10-CM

## 2018-06-19 DIAGNOSIS — S80211A Abrasion, right knee, initial encounter: Secondary | ICD-10-CM | POA: Diagnosis not present

## 2018-06-19 MED ORDER — AMOXICILLIN-POT CLAVULANATE 875-125 MG PO TABS: 1.0000 | ORAL_TABLET | Freq: Two times a day (BID) | ORAL | 0 refills | Status: DC

## 2018-06-19 NOTE — ED Triage Notes (Signed)
Pt presents to be seen following a fall and getting abrasions on both knees that looks to be infected

## 2018-06-19 NOTE — ED Provider Notes (Signed)
[place]   [id number] 06/19/18 Arrival Time: 5993  ASSESSMENT & PLAN:  1. Abrasion of knee, bilateral     Meds ordered this encounter  Medications  . amoxicillin-clavulanate (AUGMENTIN) 875-125 MG tablet    Sig: Take 1 tablet by mouth every 12 (twelve) hours.    Dispense:  20 tablet    Refill:  0    Stop Neosporin and start oral antibiotic. Has f/u with PCP scheduled.  Reviewed expectations re: course of current medical issues. Questions answered. Outlined signs and symptoms indicating need for more acute intervention. Patient verbalized understanding. After Visit Summary given.   SUBJECTIVE:  Kelly [name] is a 37 y.o. female who presents with a skin complaint.   Location: bilateral knees Reports falling and skinning knees a few days ago. Questions if infected. Pruritic? No Painful? Mild discomfort Progression: stable  Drainage? No ; but with "white and yellow stuff all over it" Normal ambulation. Afebrile Other associated symptoms: none Therapies tried thus far: Neosporin; questions help No specific aggravating or alleviating factors reported.   ROS: As per HPI.  OBJECTIVE: Vitals:   06/19/18 1417  Pulse: 83  Resp: 20  Temp: 98.1 F (36.7 C)  SpO2: 100%    General appearance: alert; no distress Lungs: clear to auscultation bilaterally Heart: regular rate and rhythm Extremities: no edema Skin: warm and dry; bilateral knee abrasions with thick white/yellow coating consistent with superficial infection; no odor; no signs of allergic rxn to Neosporin Psychological: alert and cooperative; normal mood and affect  No Known Allergies  Past Medical History:  Diagnosis Date  . Alcohol abuse    chronic  . Bipolar disorder ([place])   . Chronic kidney disease   . Cognitive changes   . Cognitive impairment   . Depression   . Depression   . Diabetes mellitus without complication ([place])   . Elevated lipids   . Hyperparathyroidism ([place])   .  Hypertension   . Staph aureus infection    A/V fistula  . UTI (lower urinary tract infection)    Social History   Socioeconomic History  . Marital status: Single    Spouse name: Not on file  . Number of children: Not on file  . Years of education: Not on file  . Highest education level: Not on file  Occupational History  . Not on file  Social Needs  . Financial resource strain: Not on file  . Food insecurity:    Worry: Not on file    Inability: Not on file  . Transportation needs:    Medical: Not on file    Non-medical: Not on file  Tobacco Use  . Smoking status: Current Every Day Smoker    Packs/day: 0.25    Years: 0.00    Pack years: 0.00    Types: Cigarettes  . Smokeless tobacco: Current User  Substance and Sexual Activity  . Alcohol use: No  . Drug use: No  . Sexual activity: Not on file  Lifestyle  . Physical activity:    Days per week: Not on file    Minutes per session: Not on file  . Stress: Not on file  Relationships  . Social connections:    Talks on phone: Not on file    Gets together: Not on file    Attends religious service: Not on file    Active member of club or organization: Not on file    Attends meetings of clubs or organizations: Not on file  Relationship status: Not on file  . Intimate partner violence:    Fear of current or ex partner: Not on file    Emotionally abused: Not on file    Physically abused: Not on file    Forced sexual activity: Not on file  Other Topics Concern  . Not on file  Social History Narrative  . Not on file   No family history on file. Past Surgical History:  Procedure Laterality Date  . AV FISTULA PLACEMENT    . CHOLECYSTECTOMY    . dialysis perma cath Right    chest  . PERIPHERAL VASCULAR CATHETERIZATION N/A 05/24/2015   Procedure: Dialysis/Perma Catheter Removal;  Surgeon: Katha Cabal, MD;  Location: [facility];  Service: Cardiovascular;  Laterality: N/A;  . PERIPHERAL VASCULAR  CATHETERIZATION Left 02/28/2016   Procedure: A/V Shuntogram/Fistulagram;  Surgeon: Katha Cabal, MD;  Location: [facility];  Service: Cardiovascular;  Laterality: Left;  . PERIPHERAL VASCULAR CATHETERIZATION N/A 02/28/2016   Procedure: A/V Shunt Intervention;  Surgeon: Katha Cabal, MD;  Location: [facility];  Service: Cardiovascular;  Laterality: N/A;  . PERIPHERAL VASCULAR CATHETERIZATION N/A 06/26/2016   Procedure: A/V Shuntogram/Fistulagram;  Surgeon: Katha Cabal, MD;  Location: [facility];  Service: Cardiovascular;  Laterality: N/A;  . PERIPHERAL VASCULAR CATHETERIZATION N/A 09/10/2016   Procedure: A/V Shuntogram/Fistulagram;  Surgeon: Algernon Huxley, MD;  Location: [facility];  Service: Cardiovascular;  Laterality: N/A;  . PERIPHERAL VASCULAR CATHETERIZATION Left 09/14/2016   Procedure: Thrombectomy;  Surgeon: Katha Cabal, MD;  Location: [facility];  Service: Cardiovascular;  Laterality: Left;  Marland Kitchen VASCULAR ACCESS DEVICE INSERTION Left 04/22/2015   Procedure: INSERTION OF HERO VASCULAR ACCESS DEVICE;  Surgeon: Katha Cabal, MD;  Location: [facility];  Service: Vascular;  Laterality: Left;     Vanessa Kick, MD 06/19/18 1428

## 2018-06-27 ENCOUNTER — Encounter: Payer: Self-pay | Admitting: Podiatry

## 2018-06-27 ENCOUNTER — Ambulatory Visit (INDEPENDENT_AMBULATORY_CARE_PROVIDER_SITE_OTHER): Payer: Medicaid Other | Admitting: Podiatry

## 2018-06-27 DIAGNOSIS — B351 Tinea unguium: Secondary | ICD-10-CM

## 2018-06-27 DIAGNOSIS — M79675 Pain in left toe(s): Secondary | ICD-10-CM

## 2018-06-27 DIAGNOSIS — M79674 Pain in right toe(s): Secondary | ICD-10-CM | POA: Diagnosis not present

## 2018-06-27 DIAGNOSIS — E119 Type 2 diabetes mellitus without complications: Secondary | ICD-10-CM

## 2018-06-27 DIAGNOSIS — Q828 Other specified congenital malformations of skin: Secondary | ICD-10-CM

## 2018-06-27 NOTE — Progress Notes (Signed)
Complaint:  Visit Type: Patient returns to my office for continued preventative foot care services. Complaint: Patient states" my nails have grown long and thick and become painful to walk and wear shoes" Patient has been diagnosed with DM with no foot complications. The patient presents for preventative foot care services. No changes to ROS  Podiatric Exam: Vascular: dorsalis pedis and posterior tibial pulses are diminished  palpable bilateral. Capillary return is immediate. Temperature gradient is WNL. Skin turgor WNL  Sensorium: Normal Semmes Weinstein monofilament test. Normal tactile sensation bilaterally. Nail Exam: Pt has thick disfigured discolored nails with subungual debris noted bilateral entire nail hallux through fifth toenails Ulcer Exam: There is no evidence of ulcer or pre-ulcerative changes or infection. Orthopedic Exam: Muscle tone and strength are WNL. No limitations in general ROM. No crepitus or effusions noted. Hammer toes  B/l. Bony prominences are unremarkable. Skin:  Porokeratosis sub 5th  B/L and posterior left heel. No infection or ulcers  Diagnosis:  Onychomycosis, , Pain in right toe, pain in left toes,  Debride callus/porokeratosis  Treatment & Plan Procedures and Treatment: Consent by patient was obtained for treatment procedures.   Debridement of mycotic and hypertrophic toenails, 1 through 5 bilateral and clearing of subungual debris. No ulceration, no infection noted. Debride callus  B/l. Return Visit-Office Procedure: Patient instructed to return to the office for a follow up visit 3 months for continued evaluation and treatment.    Gardiner Barefoot DPM

## 2018-09-23 ENCOUNTER — Other Ambulatory Visit (INDEPENDENT_AMBULATORY_CARE_PROVIDER_SITE_OTHER): Payer: Self-pay | Admitting: Vascular Surgery

## 2018-09-23 DIAGNOSIS — N186 End stage renal disease: Secondary | ICD-10-CM

## 2018-09-24 ENCOUNTER — Encounter (INDEPENDENT_AMBULATORY_CARE_PROVIDER_SITE_OTHER): Payer: Self-pay | Admitting: Nurse Practitioner

## 2018-09-24 ENCOUNTER — Ambulatory Visit (INDEPENDENT_AMBULATORY_CARE_PROVIDER_SITE_OTHER): Payer: Medicaid Other

## 2018-09-24 ENCOUNTER — Ambulatory Visit (INDEPENDENT_AMBULATORY_CARE_PROVIDER_SITE_OTHER): Payer: Medicaid Other | Admitting: Nurse Practitioner

## 2018-09-24 VITALS — BP 138/76 | HR 84 | Resp 16 | Ht 64.0 in | Wt 198.9 lb

## 2018-09-24 DIAGNOSIS — F17210 Nicotine dependence, cigarettes, uncomplicated: Secondary | ICD-10-CM

## 2018-09-24 DIAGNOSIS — F70 Mild intellectual disabilities: Secondary | ICD-10-CM

## 2018-09-24 DIAGNOSIS — Z992 Dependence on renal dialysis: Secondary | ICD-10-CM | POA: Diagnosis not present

## 2018-09-24 DIAGNOSIS — I120 Hypertensive chronic kidney disease with stage 5 chronic kidney disease or end stage renal disease: Secondary | ICD-10-CM

## 2018-09-24 DIAGNOSIS — N186 End stage renal disease: Secondary | ICD-10-CM

## 2018-09-24 DIAGNOSIS — I10 Essential (primary) hypertension: Secondary | ICD-10-CM

## 2018-09-25 ENCOUNTER — Encounter (INDEPENDENT_AMBULATORY_CARE_PROVIDER_SITE_OTHER): Payer: Self-pay | Admitting: Nurse Practitioner

## 2018-09-25 ENCOUNTER — Encounter (INDEPENDENT_AMBULATORY_CARE_PROVIDER_SITE_OTHER): Payer: Self-pay

## 2018-09-25 NOTE — Progress Notes (Signed)
Subjective:    Patient ID: Kelly [name], female    DOB: [birth year], 37 y.o.   MRN: [id number] Chief Complaint  Patient presents with  . Follow-up    pt having low flows    HPI  Kelly [name] is a 37 y.o. female The patient returns to the office for follow up regarding problem with the dialysis access. Currently the patient is maintained via a left upper extremity hero graft placed on 04/21/2016.  The patient has had multiple failed upper extremity accesses.  The patient notes a significant increase in bleeding time after decannulation.  The patient has also been informed that there is increased recirculation.    The patient denies hand pain or other symptoms consistent with steal phenomena.  No significant arm swelling.  The dialysis center is reporting low flows.  The patient denies redness or swelling at the access site. The patient denies fever or chills at home or while on dialysis.  The patient denies amaurosis fugax or recent TIA symptoms. There are no recent neurological changes noted. The patient denies claudication symptoms or rest pain symptoms. The patient denies history of DVT, PE or superficial thrombophlebitis. The patient denies recent episodes of angina or shortness of breath.   The patient underwent a hemodialysis access duplex today which had elevated velocities of 464 in the mid graft.  There is also a narrowing to 0.27 cm in this area.  There also appears to be an area above the graft that is consistent with hematoma.   Past Medical History:  Diagnosis Date  . Alcohol abuse    chronic  . Bipolar disorder ([place])   . Chronic kidney disease   . Cognitive changes   . Cognitive impairment   . Depression   . Depression   . Diabetes mellitus without complication ([place])   . Elevated lipids   . Hyperparathyroidism ([place])   . Hypertension   . Staph aureus infection    A/V fistula  . UTI (lower urinary tract infection)     Past Surgical History:  Procedure  Laterality Date  . AV FISTULA PLACEMENT    . CHOLECYSTECTOMY    . dialysis perma cath Right    chest  . PERIPHERAL VASCULAR CATHETERIZATION N/A 05/24/2015   Procedure: Dialysis/Perma Catheter Removal;  Surgeon: Katha Cabal, MD;  Location: [facility];  Service: Cardiovascular;  Laterality: N/A;  . PERIPHERAL VASCULAR CATHETERIZATION Left 02/28/2016   Procedure: A/V Shuntogram/Fistulagram;  Surgeon: Katha Cabal, MD;  Location: [place] [facility];  Service: Cardiovascular;  Laterality: Left;  . PERIPHERAL VASCULAR CATHETERIZATION N/A 02/28/2016   Procedure: A/V Shunt Intervention;  Surgeon: Katha Cabal, MD;  Location: [facility];  Service: Cardiovascular;  Laterality: N/A;  . PERIPHERAL VASCULAR CATHETERIZATION N/A 06/26/2016   Procedure: A/V Shuntogram/Fistulagram;  Surgeon: Katha Cabal, MD;  Location: [place] [facility];  Service: Cardiovascular;  Laterality: N/A;  . PERIPHERAL VASCULAR CATHETERIZATION N/A 09/10/2016   Procedure: A/V Shuntogram/Fistulagram;  Surgeon: Algernon Huxley, MD;  Location: [place] [facility];  Service: Cardiovascular;  Laterality: N/A;  . PERIPHERAL VASCULAR CATHETERIZATION Left 09/14/2016   Procedure: Thrombectomy;  Surgeon: Katha Cabal, MD;  Location: [place];  Service: Cardiovascular;  Laterality: Left;  Marland Kitchen VASCULAR ACCESS DEVICE INSERTION Left 04/22/2015   Procedure: INSERTION OF HERO VASCULAR ACCESS DEVICE;  Surgeon: Katha Cabal, MD;  Location: [facility];  Service: Vascular;  Laterality: Left;    Social History  Socioeconomic History  . Marital status: Single    Spouse name: Not on file  . Number of children: Not on file  . Years of education: Not on file  . Highest education level: Not on file  Occupational History  . Not on file  Social Needs  . Financial resource strain: Not on file  . Food insecurity:    Worry: Not on file    Inability: Not on file  . Transportation needs:    Medical:  Not on file    Non-medical: Not on file  Tobacco Use  . Smoking status: Current Every Day Smoker    Packs/day: 0.25    Years: 0.00    Pack years: 0.00    Types: Cigarettes  . Smokeless tobacco: Current User  Substance and Sexual Activity  . Alcohol use: No  . Drug use: No  . Sexual activity: Not on file  Lifestyle  . Physical activity:    Days per week: Not on file    Minutes per session: Not on file  . Stress: Not on file  Relationships  . Social connections:    Talks on phone: Not on file    Gets together: Not on file    Attends religious service: Not on file    Active member of club or organization: Not on file    Attends meetings of clubs or organizations: Not on file    Relationship status: Not on file  . Intimate partner violence:    Fear of current or ex partner: Not on file    Emotionally abused: Not on file    Physically abused: Not on file    Forced sexual activity: Not on file  Other Topics Concern  . Not on file  Social History Narrative  . Not on file    History reviewed. No pertinent family history.  No Known Allergies   Review of Systems   Review of Systems: Negative Unless Checked Constitutional: [] Weight loss  [] Fever  [] Chills Cardiac: [] Chest pain   []  Atrial Fibrillation  [] Palpitations   [] Shortness of breath when laying flat   [] Shortness of breath with exertion. Vascular:  [] Pain in legs with walking   [] Pain in legs with standing  [] History of DVT   [] Phlebitis   [] Swelling in legs   [] Varicose veins   [] Non-healing ulcers Pulmonary:   [] Uses home oxygen   [] Productive cough   [] Hemoptysis   [] Wheeze  [] COPD   [] Asthma Neurologic:  [] Dizziness   [] Seizures   [] History of stroke   [] History of TIA  [] Aphasia   [x] Vissual changes   [] Weakness or numbness in arm   [] Weakness or numbness in leg Musculoskeletal:   [] Joint swelling   [] Joint pain   [] Low back pain  []  History of Knee Replacement Hematologic:  [] Easy bruising  [] Easy bleeding    [] Hypercoagulable state   [x] Anemic Gastrointestinal:  [] Diarrhea   [] Vomiting  [] Gastroesophageal reflux/heartburn   [] Difficulty swallowing. Genitourinary:  [] Chronic kidney disease   [] Difficult urination  [] Anuric   [] Blood in urine Skin:  [] Rashes   [] Ulcers  Psychological:  [] History of anxiety   [x]  History of major depression  [x]  Memory Difficulties     Objective:   Physical Exam  BP 138/76 (BP Location: Right Arm)   Pulse 84   Resp 16   Ht 5\' 4"  (1.626 m)   Wt 198 lb 14.4 oz (90.2 kg)   BMI 34.14 kg/m   Gen: WD/WN, NAD Head: [place]/AT, No temporalis wasting.  Ear/Nose/Throat: Hearing grossly intact, nares w/o erythema or drainage Eyes: PER, EOMI, sclera nonicteric.  Neck: Supple, no masses.  No JVD.  Pulmonary:  Good air movement, no use of accessory muscles.  Cardiac: RRR Vascular:  Soft thrill, good bruit auscultated.  Swollen slightly tender quarter sized area and proximal area of graft. Vessel Right Left  Radial Palpable Palpable  Gastrointestinal: soft, non-distended. No guarding/no peritoneal signs.  Musculoskeletal: M/S 5/5 throughout.  No deformity or atrophy.  Neurologic: Pain and light touch intact in extremities.  Symmetrical.  Speech is fluent. Motor exam as listed above. Psychiatric: Judgment intact, Mood & affect appropriate for pt's clinical situation. Dermatologic: No Venous rashes. No Ulcers Noted.  No changes consistent with cellulitis. Lymph : No Cervical lymphadenopathy, no lichenification or skin changes of chronic lymphedema.      Assessment & Plan:   1. ESRD on dialysis [facility]) The patient underwent a hemodialysis access duplex today which had elevated velocities of 464 in the mid graft.  There is also a narrowing to 0.27 cm in this area.  There also appears to be an area above the graft that is consistent with hematoma.   Recommend:  The patient is experiencing increasing problems with their dialysis access.  Patient should have a left upper  extremity shuntogram with the intention for intervention.  The intention for intervention is to restore appropriate flow and prevent thrombosis and possible loss of the access.  As well as improve the quality of dialysis therapy.  The risks, benefits and alternative therapies were reviewed in detail with the patient.  All questions were answered.  The patient agrees to proceed with angio/intervention.      2. Essential hypertension Continue antihypertensive medications as already ordered, these medications have been reviewed and there are no changes at this time.   3. Mild intellectual disability Patient presents today with her administrator who is able to make medical decisions for her.  He was present for the entire conversation regarding her AV access.  He also agrees to shuntogram.   Current Outpatient Medications on File Prior to Visit  Medication Sig Dispense Refill  . acetaminophen (TYLENOL) 325 MG tablet Take 2 tablets (650 mg total) by mouth every 6 (six) hours as needed for mild pain, moderate pain or headache. 30 tablet 0  . ALBUTEROL IN Inhale into the lungs.    Marland Kitchen amLODipine (NORVASC) 10 MG tablet Take 10 mg by mouth at bedtime. *hold for systolic blood pressure <295, call MD if holding this medication.*    . aspirin 81 MG chewable tablet Chew 1 tablet (81 mg total) by mouth daily. 30 tablet 0  . B Complex-C-Folic Acid (RENA-VITE RX) 1 MG TABS Take 1 tablet by mouth daily. 30 tablet 0  . calcium acetate (PHOSLO) 667 MG capsule Take 2 capsules (1,334 mg total) by mouth 2 (two) times daily with a meal. 60 capsule 0  . cetaphil (CETAPHIL) lotion Apply 1 application topically daily.    . cinacalcet (SENSIPAR) 60 MG tablet Take 1 tablet (60 mg total) by mouth daily. 60 tablet 0  . divalproex (DEPAKOTE) 500 MG DR tablet Take 2 tablets (1,000 mg total) by mouth at bedtime. 60 tablet 0  . docusate sodium (COLACE) 100 MG capsule Take 1 capsule (100 mg total) by mouth at bedtime. 30  capsule 0  . escitalopram (LEXAPRO) 10 MG tablet Take 1.5 tablets (15 mg total) by mouth daily. 45 tablet 1  . Fluticasone Propionate HFA (FLOVENT HFA IN) Inhale into  the lungs.    . folic acid (FOLVITE) 1 MG tablet Take 2 tablets (2 mg total) by mouth at bedtime. 30 tablet 0  . glucagon, human recombinant, (GLUCAGEN DIAGNOSTIC) 1 MG injection Inject 1 mg into the vein once as needed for low blood sugar.    . hydrALAZINE (APRESOLINE) 25 MG tablet Take 25 mg by mouth 3 (three) times daily.    . insulin aspart (NOVOLOG) 100 UNIT/ML injection Inject 0-15 Units into the skin 3 (three) times daily before meals. Per sliding scale     . insulin glargine (LANTUS) 100 UNIT/ML injection Inject 20 Units into the skin at bedtime.    . lamoTRIgine (LAMICTAL) 150 MG tablet Take 150 mg by mouth daily.    Marland Kitchen lidocaine-prilocaine (EMLA) cream Apply 1 application topically 3 (three) times a week. Apply to access site 30 minutes before dialysis. 30 g 0  . medroxyPROGESTERone (DEPO-PROVERA) 150 MG/ML injection Inject 150 mg into the muscle every 3 (three) months.    . metoCLOPramide (REGLAN) 10 MG tablet Take 10 mg by mouth as directed.    . metoprolol (LOPRESSOR) 50 MG tablet Take 1 tablet (50 mg total) by mouth 2 (two) times daily. 60 tablet 1  . midodrine (PROAMATINE) 10 MG tablet Take 10 mg by mouth as directed.    . Multiple Vitamin (THEREMS) TABS Take 1 tablet by mouth daily. 30 tablet 0  . pantoprazole (PROTONIX) 20 MG tablet Take 1 tablet (20 mg total) by mouth daily. 30 tablet 0  . patiromer (VELTASSA) 8.4 g packet Take 1 packet (8.4 g total) by mouth daily. 30 packet 0  . QUEtiapine (SEROQUEL) 200 MG tablet Take 1 tablet (200 mg total) by mouth 2 (two) times daily. 60 tablet 0  . risperiDONE (RISPERDAL) 0.5 MG tablet Take 0.5 mg by mouth at bedtime.    . Tiotropium Bromide-Olodaterol (STIOLTO RESPIMAT IN) Inhale into the lungs.    . traZODone (DESYREL) 100 MG tablet Take 1 tablet (100 mg total) by mouth at  bedtime. 30 tablet 0  . vitamin B-12 (CYANOCOBALAMIN) 1000 MCG tablet Take 1 tablet (1,000 mcg total) by mouth at bedtime. 30 tablet 0  . amoxicillin-clavulanate (AUGMENTIN) 875-125 MG tablet Take 1 tablet by mouth every 12 (twelve) hours. (Patient not taking: Reported on 09/24/2018) 20 tablet 0  . cloNIDine (CATAPRES) 0.1 MG tablet Take 1 tablet (0.1 mg total) by mouth 2 (two) times daily. (Patient not taking: Reported on 09/24/2018) 60 tablet 0  . trimethoprim-polymyxin b (POLYTRIM) ophthalmic solution Place 1 drop into the right eye every 6 (six) hours. (Patient not taking: Reported on 09/24/2018) 10 mL 0   No current facility-administered medications on file prior to visit.     There are no Patient Instructions on file for this visit. No follow-ups on file.   Kris Hartmann, NP  This note was completed with Sales executive.  Any errors are purely unintentional.

## 2018-09-26 ENCOUNTER — Encounter: Payer: Self-pay | Admitting: Podiatry

## 2018-09-26 ENCOUNTER — Ambulatory Visit (INDEPENDENT_AMBULATORY_CARE_PROVIDER_SITE_OTHER): Payer: Medicaid Other | Admitting: Podiatry

## 2018-09-26 DIAGNOSIS — M79676 Pain in unspecified toe(s): Secondary | ICD-10-CM | POA: Diagnosis not present

## 2018-09-26 DIAGNOSIS — B351 Tinea unguium: Secondary | ICD-10-CM | POA: Diagnosis not present

## 2018-09-26 DIAGNOSIS — E119 Type 2 diabetes mellitus without complications: Secondary | ICD-10-CM | POA: Diagnosis not present

## 2018-09-26 DIAGNOSIS — Q828 Other specified congenital malformations of skin: Secondary | ICD-10-CM

## 2018-09-26 DIAGNOSIS — M79674 Pain in right toe(s): Secondary | ICD-10-CM

## 2018-09-26 DIAGNOSIS — M79675 Pain in left toe(s): Principal | ICD-10-CM

## 2018-09-26 NOTE — Progress Notes (Signed)
Complaint:  Visit Type: Patient returns to my office for continued preventative foot care services. Complaint: Patient states" my nails have grown long and thick and become painful to walk and wear shoes" Patient has been diagnosed with DM with no foot complications. The patient presents for preventative foot care services. No changes to ROS  Podiatric Exam: Vascular: dorsalis pedis and posterior tibial pulses are diminished  palpable bilateral. Capillary return is immediate. Temperature gradient is WNL. Skin turgor WNL  Sensorium: Normal Semmes Weinstein monofilament test. Normal tactile sensation bilaterally. Nail Exam: Pt has thick disfigured discolored nails with subungual debris noted bilateral entire nail hallux through fifth toenails Ulcer Exam: There is no evidence of ulcer or pre-ulcerative changes or infection. Orthopedic Exam: Muscle tone and strength are WNL. No limitations in general ROM. No crepitus or effusions noted. Hammer toes  B/l. Bony prominences are unremarkable. Skin:  Porokeratosis sub 5th  B/L and posterior left heel. No infection or ulcers  Diagnosis:  Onychomycosis, , Pain in right toe, pain in left toes,  Debride callus/porokeratosis  Treatment & Plan Procedures and Treatment: Consent by patient was obtained for treatment procedures.   Debridement of mycotic and hypertrophic toenails, 1 through 5 bilateral and clearing of subungual debris. No ulceration, no infection noted. Debride callus  B/l. Return Visit-Office Procedure: Patient instructed to return to the office for a follow up visit 3 months for continued evaluation and treatment.    Gardiner Barefoot DPM

## 2018-10-06 ENCOUNTER — Other Ambulatory Visit (INDEPENDENT_AMBULATORY_CARE_PROVIDER_SITE_OTHER): Payer: Self-pay | Admitting: Nurse Practitioner

## 2018-10-06 MED ORDER — CEFAZOLIN SODIUM-DEXTROSE 1-4 GM/50ML-% IV SOLN
1.0000 g | Freq: Once | INTRAVENOUS | Status: AC
  Administered 2018-10-07: 15:00:00 via INTRAVENOUS

## 2018-10-06 NOTE — Progress Notes (Signed)
Spoke with Kelly [name] legal guardian for Kelly [name] to see if she has spoken with dr. Delana [name] regarding the patient's procedure tomorrow.  She says she has not spoken with him.  Message left on nurses line requesting dr. Delana [name] call legal guardian and speak with her about Kelly [name]'s procedure so we can expedite obtaining consent tomorrow.

## 2018-10-07 ENCOUNTER — Encounter
Admission: RE | Disposition: A | Discharge status: Home / Self Care | Payer: Self-pay | Source: Ambulatory Visit | Attending: Vascular Surgery

## 2018-10-07 ENCOUNTER — Encounter: Payer: Self-pay | Admitting: *Deleted

## 2018-10-07 ENCOUNTER — Other Ambulatory Visit: Payer: Self-pay

## 2018-10-07 ENCOUNTER — Ambulatory Visit
Admission: RE | Admit: 2018-10-07 | Discharge: 2018-10-07 | Disposition: A | Discharge status: Home / Self Care | Payer: Medicaid Other | Source: Ambulatory Visit | Attending: Vascular Surgery | Admitting: Vascular Surgery

## 2018-10-07 DIAGNOSIS — Z87440 Personal history of urinary (tract) infections: Secondary | ICD-10-CM | POA: Insufficient documentation

## 2018-10-07 DIAGNOSIS — Z992 Dependence on renal dialysis: Secondary | ICD-10-CM

## 2018-10-07 DIAGNOSIS — F17210 Nicotine dependence, cigarettes, uncomplicated: Secondary | ICD-10-CM | POA: Insufficient documentation

## 2018-10-07 DIAGNOSIS — Z9049 Acquired absence of other specified parts of digestive tract: Secondary | ICD-10-CM | POA: Diagnosis not present

## 2018-10-07 DIAGNOSIS — Z9889 Other specified postprocedural states: Secondary | ICD-10-CM | POA: Diagnosis not present

## 2018-10-07 DIAGNOSIS — I120 Hypertensive chronic kidney disease with stage 5 chronic kidney disease or end stage renal disease: Secondary | ICD-10-CM | POA: Insufficient documentation

## 2018-10-07 DIAGNOSIS — Z7982 Long term (current) use of aspirin: Secondary | ICD-10-CM | POA: Insufficient documentation

## 2018-10-07 DIAGNOSIS — Z79899 Other long term (current) drug therapy: Secondary | ICD-10-CM | POA: Insufficient documentation

## 2018-10-07 DIAGNOSIS — E1122 Type 2 diabetes mellitus with diabetic chronic kidney disease: Secondary | ICD-10-CM | POA: Diagnosis not present

## 2018-10-07 DIAGNOSIS — F70 Mild intellectual disabilities: Secondary | ICD-10-CM | POA: Insufficient documentation

## 2018-10-07 DIAGNOSIS — E039 Hypothyroidism, unspecified: Secondary | ICD-10-CM | POA: Insufficient documentation

## 2018-10-07 DIAGNOSIS — N186 End stage renal disease: Secondary | ICD-10-CM

## 2018-10-07 DIAGNOSIS — Z8619 Personal history of other infectious and parasitic diseases: Secondary | ICD-10-CM | POA: Insufficient documentation

## 2018-10-07 DIAGNOSIS — T82858A Stenosis of vascular prosthetic devices, implants and grafts, initial encounter: Secondary | ICD-10-CM

## 2018-10-07 DIAGNOSIS — Z794 Long term (current) use of insulin: Secondary | ICD-10-CM | POA: Insufficient documentation

## 2018-10-07 DIAGNOSIS — Y832 Surgical operation with anastomosis, bypass or graft as the cause of abnormal reaction of the patient, or of later complication, without mention of misadventure at the time of the procedure: Secondary | ICD-10-CM | POA: Insufficient documentation

## 2018-10-07 HISTORY — PX: A/V SHUNTOGRAM: CATH118297

## 2018-10-07 LAB — GLUCOSE, CAPILLARY: GLUCOSE-CAPILLARY: 97 mg/dL (ref 70–99)

## 2018-10-07 LAB — POTASSIUM (ARMC VASCULAR LAB ONLY): Potassium (ARMC vascular lab): 4.2 (ref 3.5–5.1)

## 2018-10-07 LAB — HCG, QUANTITATIVE, PREGNANCY: HCG, BETA CHAIN, QUANT, S: 2 m[IU]/mL (ref <5)

## 2018-10-07 SURGERY — A/V SHUNTOGRAM
Anesthesia: Moderate Sedation | Site: Arm Upper | Laterality: Left

## 2018-10-07 MED ORDER — MIDAZOLAM HCL 5 MG/5ML IJ SOLN
INTRAMUSCULAR | Status: AC
  Filled 2018-10-07: qty 5

## 2018-10-07 MED ORDER — LIDOCAINE HCL (PF) 1 % IJ SOLN
INTRAMUSCULAR | Status: AC
  Filled 2018-10-07: qty 30

## 2018-10-07 MED ORDER — CEFAZOLIN SODIUM-DEXTROSE 1-4 GM/50ML-% IV SOLN
INTRAVENOUS | Status: AC
  Filled 2018-10-07: qty 50

## 2018-10-07 MED ORDER — HEPARIN SODIUM (PORCINE) 1000 UNIT/ML IJ SOLN
INTRAMUSCULAR | Status: AC
  Filled 2018-10-07: qty 1

## 2018-10-07 MED ORDER — IOPAMIDOL (ISOVUE-300) INJECTION 61%
INTRAVENOUS | Status: DC | PRN
  Administered 2018-10-07: 25 mL via INTRA_ARTERIAL

## 2018-10-07 MED ORDER — HYDROMORPHONE HCL 1 MG/ML IJ SOLN: 1.0000 mg | Freq: Once | INTRAMUSCULAR | Status: DC | PRN

## 2018-10-07 MED ORDER — HEPARIN SODIUM (PORCINE) 1000 UNIT/ML IJ SOLN
INTRAMUSCULAR | Status: DC | PRN
  Administered 2018-10-07: 3000 [IU] via INTRAVENOUS

## 2018-10-07 MED ORDER — MIDAZOLAM HCL 2 MG/2ML IJ SOLN
INTRAMUSCULAR | Status: DC | PRN
  Administered 2018-10-07: 2 mg via INTRAVENOUS

## 2018-10-07 MED ORDER — FENTANYL CITRATE (PF) 100 MCG/2ML IJ SOLN
INTRAMUSCULAR | Status: AC
  Filled 2018-10-07: qty 2

## 2018-10-07 MED ORDER — HEPARIN (PORCINE) IN NACL 1000-0.9 UT/500ML-% IV SOLN
INTRAVENOUS | Status: AC
  Filled 2018-10-07: qty 1000

## 2018-10-07 MED ORDER — FENTANYL CITRATE (PF) 100 MCG/2ML IJ SOLN
INTRAMUSCULAR | Status: DC | PRN
  Administered 2018-10-07: 50 ug via INTRAVENOUS

## 2018-10-07 MED ORDER — ONDANSETRON HCL 4 MG/2ML IJ SOLN: 4.0000 mg | Freq: Four times a day (QID) | INTRAMUSCULAR | Status: DC | PRN

## 2018-10-07 MED ORDER — SODIUM CHLORIDE 0.9 % IV SOLN
INTRAVENOUS | Status: DC
  Administered 2018-10-07: 14:00:00 via INTRAVENOUS

## 2018-10-07 SURGICAL SUPPLY — 14 items
BALLN DORADO 8X40X80 (BALLOONS) ×3
BALLN DORADO 8X60X80 (BALLOONS) ×3
BALLOON DORADO 8X40X80 (BALLOONS) ×1 IMPLANT
BALLOON DORADO 8X60X80 (BALLOONS) ×1 IMPLANT
DEVICE PRESTO INFLATION (MISCELLANEOUS) ×3 IMPLANT
NEEDLE ENTRY 21GA 7CM ECHOTIP (NEEDLE) ×3 IMPLANT
PACK ANGIOGRAPHY (CUSTOM PROCEDURE TRAY) ×3 IMPLANT
SET INTRO CAPELLA COAXIAL (SET/KITS/TRAYS/PACK) ×3 IMPLANT
SHEATH BRITE TIP 6FRX5.5 (SHEATH) ×3 IMPLANT
SHEATH BRITE TIP 7FRX5.5 (SHEATH) ×3 IMPLANT
STENT VIABAHN 8X7.5X120 (Permanent Stent) ×3 IMPLANT
SUT MNCRL AB 4-0 PS2 18 (SUTURE) ×3 IMPLANT
WIRE G 018X200 V18 (WIRE) ×3 IMPLANT
WIRE MAGIC TOR.035 180C (WIRE) ×3 IMPLANT

## 2018-10-07 NOTE — Op Note (Signed)
OPERATIVE NOTE   PROCEDURE: 1. Contrast injection left arm hero AV access 2. Percutaneous transluminal angioplasty and stent placement midportion left arm hero graft peripheral segment  PRE-OPERATIVE DIAGNOSIS: Complication of dialysis access                                                       End Stage Renal Disease  POST-OPERATIVE DIAGNOSIS: same as above   SURGEON: Katha Cabal, M.D.  ANESTHESIA: Conscious sedation was administered under my direct supervision by the interventional radiology RN. IV Versed plus fentanyl were utilized. Continuous ECG, pulse oximetry and blood pressure was monitored throughout the entire procedure.  Conscious sedation was for a total of 36 minutes.  ESTIMATED BLOOD LOSS: minimal  FINDING(S): 1. Diffuse greater than 80% stenosis in the midportion of the peripheral segment of the graft  SPECIMEN(S):  None  CONTRAST: 25 cc  FLUOROSCOPY TIME: 2.3 minutes  INDICATIONS: Kelly [name] is a 37 y.o. female who  presents with malfunctioning left arm hero AV access.  The patient is scheduled for angiography with possible intervention of the AV access.  The patient is aware the risks include but are not limited to: bleeding, infection, thrombosis of the cannulated access, and possible anaphylactic reaction to the contrast.  The patient acknowledges if the access can not be salvaged a tunneled catheter will be needed and will be placed during this procedure.  The patient is aware of the risks of the procedure and elects to proceed with the angiogram and intervention.  DESCRIPTION: After full informed written consent was obtained, the patient was brought back to the Special Procedure suite and placed supine position.  Appropriate cardiopulmonary monitors were placed.  The left arm was prepped and draped in the standard fashion.  Appropriate timeout is called. The hero graft was cannulated with a micropuncture needle.  Cannulation was performed with  ultrasound guidance. Ultrasound was placed in a sterile sleeve, the AV access was interrogated and noted to be echolucent and compressible indicating patency. Image was recorded for the permanent record. The puncture is performed under continuous ultrasound visualization.   The microwire was advanced and the needle was exchanged for  a microsheath.  The J-wire was then advanced and a 6 Fr sheath inserted.  Hand injections were completed to image the access from the arterial anastomosis through the entire access.  The central venous structures were also imaged by hand injections.  Diffuse greater than 80% stenosis within the midportion of the AV graft, the peripheral segment was identified.  Central portion of the hero graft was intact no abnormalities.  Based on the images,  3000 units of heparin was given and a wire was negotiated through the strictures within the venous portion of the graft.  An 8 mm x 75 mm Viabahn was deployed across the stenoses and postdilated with an 8 mm [place] balloon.  Follow-up imaging demonstrated less than 10% residual stenosis within the peripheral segment of the AV graft.  Follow-up imaging demonstrates complete resolution of the stricture with rapid flow of contrast through the graft, the central venous anatomy is preserved.  A 4-0 Monocryl purse-string suture was sewn around the sheath.  The sheath was removed and light pressure was applied.  A sterile bandage was applied to the puncture site.    COMPLICATIONS: None  CONDITION: Good  Katha Cabal, M.D [facility] Office: [phone]  10/07/2018 4:03 PM

## 2018-10-07 NOTE — H&P (Signed)
[facility] History & Physical Update  The patient was interviewed and re-examined.  The patient's previous History and Physical has been reviewed and is unchanged.  There is no change in the plan of care. We plan to proceed with the scheduled procedure.  Hortencia Pilar, MD  10/07/2018, 2:35 PM

## 2018-10-08 ENCOUNTER — Encounter: Payer: Self-pay | Admitting: Vascular Surgery

## 2018-10-26 ENCOUNTER — Emergency Department
Admission: EM | Admit: 2018-10-26 | Discharge: 2018-10-26 | Disposition: A | Discharge status: Skilled Nursing Facility | Payer: Medicaid Other | Attending: Student in an Organized Health Care Education/Training Program | Admitting: Student in an Organized Health Care Education/Training Program

## 2018-10-26 ENCOUNTER — Encounter: Payer: Self-pay | Admitting: Emergency Medicine

## 2018-10-26 ENCOUNTER — Emergency Department: Payer: Medicaid Other

## 2018-10-26 ENCOUNTER — Other Ambulatory Visit: Payer: Self-pay

## 2018-10-26 DIAGNOSIS — W2209XA Striking against other stationary object, initial encounter: Secondary | ICD-10-CM | POA: Diagnosis not present

## 2018-10-26 DIAGNOSIS — E1122 Type 2 diabetes mellitus with diabetic chronic kidney disease: Secondary | ICD-10-CM | POA: Diagnosis not present

## 2018-10-26 DIAGNOSIS — S0990XA Unspecified injury of head, initial encounter: Secondary | ICD-10-CM | POA: Diagnosis not present

## 2018-10-26 DIAGNOSIS — F70 Mild intellectual disabilities: Secondary | ICD-10-CM | POA: Insufficient documentation

## 2018-10-26 DIAGNOSIS — Z992 Dependence on renal dialysis: Secondary | ICD-10-CM | POA: Diagnosis not present

## 2018-10-26 DIAGNOSIS — R5383 Other fatigue: Secondary | ICD-10-CM | POA: Insufficient documentation

## 2018-10-26 DIAGNOSIS — F17210 Nicotine dependence, cigarettes, uncomplicated: Secondary | ICD-10-CM | POA: Diagnosis not present

## 2018-10-26 DIAGNOSIS — I1311 Hypertensive heart and chronic kidney disease without heart failure, with stage 5 chronic kidney disease, or end stage renal disease: Secondary | ICD-10-CM | POA: Diagnosis not present

## 2018-10-26 DIAGNOSIS — Y999 Unspecified external cause status: Secondary | ICD-10-CM | POA: Diagnosis not present

## 2018-10-26 DIAGNOSIS — Y9389 Activity, other specified: Secondary | ICD-10-CM | POA: Insufficient documentation

## 2018-10-26 DIAGNOSIS — Y92198 Other place in other specified residential institution as the place of occurrence of the external cause: Secondary | ICD-10-CM | POA: Insufficient documentation

## 2018-10-26 DIAGNOSIS — Z79899 Other long term (current) drug therapy: Secondary | ICD-10-CM | POA: Insufficient documentation

## 2018-10-26 DIAGNOSIS — Z794 Long term (current) use of insulin: Secondary | ICD-10-CM | POA: Diagnosis not present

## 2018-10-26 DIAGNOSIS — N186 End stage renal disease: Secondary | ICD-10-CM | POA: Diagnosis not present

## 2018-10-26 LAB — BASIC METABOLIC PANEL
ANION GAP: 17 — AB (ref 5–15)
BUN: 69 mg/dL — ABNORMAL HIGH (ref 6–20)
CHLORIDE: 99 mmol/L (ref 98–111)
CO2: 24 mmol/L (ref 22–32)
Calcium: 9.3 mg/dL (ref 8.9–10.3)
Creatinine, Ser: 12.4 mg/dL — ABNORMAL HIGH (ref 0.44–1.00)
GFR, EST AFRICAN AMERICAN: 4 mL/min — AB (ref >60)
GFR, EST NON AFRICAN AMERICAN: 3 mL/min — AB (ref >60)
Glucose, Bld: 81 mg/dL (ref 70–99)
POTASSIUM: 5.4 mmol/L — AB (ref 3.5–5.1)
SODIUM: 140 mmol/L (ref 135–145)

## 2018-10-26 NOTE — ED Notes (Signed)
Pt fell from standing today, struck her head. Pt denies LoC. Pt requires assist of at least one during ambulation. Pt did say she felt like she was going to fall after ambulation to the bathroom w/ assist of two. Pt able to slowly ambulate back to treatment room w/ assist of one. Pt is irritable and quarrelsome, lacking in cooperation w/ treatment requests.

## 2018-10-26 NOTE — ED Triage Notes (Signed)
Pt arrives with group home owner after mechanical fall that resulted in pt falling backwards. No loc. No acute neuro deficits. Pt's legal guardian called with no answer

## 2018-10-26 NOTE — ED Provider Notes (Signed)
[facility] Emergency Department Provider Note  ____________________________________________  Time seen: Approximately 4:37 PM  I have reviewed the triage vital signs and the nursing notes.   HISTORY  Chief Complaint Fall    HPI Kelly [name] is a 37 y.o. female with past medical history bipolar disorder, mild intellectual disability, depression, diabetes, hypertension, CKD presents emergency department for evaluation of multiple complaints. Patient started invega shots for anger outbursts almost 1 month ago.  They told caregiver that it would take 2 weeks to take effect.  Director of care home states that patient has been more tired than usual for the last 2 weeks, which could be due to [place].  Group home also states that patient has been shuffling her feet more than usual the last 2 days.  This afternoon around 1pm she was getting up and group home was helping her get dressed when she hit the back of her head on the window.  Patient goes to dialysis Monday, Wednesday, and Friday and does not make urine.   Patient is currently not answering questions or cooperating with exam.  Past Medical History:  Diagnosis Date  . Alcohol abuse    chronic  . Bipolar disorder ([place])   . Chronic kidney disease   . Cognitive changes   . Cognitive impairment   . Depression   . Depression   . Diabetes mellitus without complication ([place])   . Elevated lipids   . Hyperparathyroidism ([place])   . Hypertension   . Staph aureus infection    A/V fistula  . UTI (lower urinary tract infection)     Patient Active Problem List   Diagnosis Date Noted  . Multiple fractures of both lower extremities 06/18/2017  . 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 Hyperkalemia 06/25/2016  . Adjustment disorder with mixed disturbance of emotions and conduct   . Diabetes ([place]) 04/23/2015  . Essential hypertension 04/23/2015  . ESRD on dialysis ([place]) 04/23/2015  . Pain of left arm  04/22/2015    Past Surgical History:  Procedure Laterality Date  . A/V SHUNTOGRAM Left 10/07/2018   Procedure: A/V SHUNTOGRAM;  Surgeon: Katha Cabal, MD;  Location: [place] [facility];  Service: Cardiovascular;  Laterality: Left;  . AV FISTULA PLACEMENT    . CHOLECYSTECTOMY    . dialysis perma cath Right    chest  . PERIPHERAL VASCULAR CATHETERIZATION N/A 05/24/2015   Procedure: Dialysis/Perma Catheter Removal;  Surgeon: Katha Cabal, MD;  Location: [place] [facility];  Service: Cardiovascular;  Laterality: N/A;  . PERIPHERAL VASCULAR CATHETERIZATION Left 02/28/2016   Procedure: A/V Shuntogram/Fistulagram;  Surgeon: Katha Cabal, MD;  Location: [place] [facility];  Service: Cardiovascular;  Laterality: Left;  . PERIPHERAL VASCULAR CATHETERIZATION N/A 02/28/2016   Procedure: A/V Shunt Intervention;  Surgeon: Katha Cabal, MD;  Location: [place] [facility];  Service: Cardiovascular;  Laterality: N/A;  . PERIPHERAL VASCULAR CATHETERIZATION N/A 06/26/2016   Procedure: A/V Shuntogram/Fistulagram;  Surgeon: Katha Cabal, MD;  Location: [place] [facility];  Service: Cardiovascular;  Laterality: N/A;  . PERIPHERAL VASCULAR CATHETERIZATION N/A 09/10/2016   Procedure: A/V Shuntogram/Fistulagram;  Surgeon: Algernon Huxley, MD;  Location: [place] [facility];  Service: Cardiovascular;  Laterality: N/A;  . PERIPHERAL VASCULAR CATHETERIZATION Left 09/14/2016   Procedure: Thrombectomy;  Surgeon: Katha Cabal, MD;  Location: [place] [facility];  Service: Cardiovascular;  Laterality: Left;  Mar[place] Kitchen VASCULAR ACCESS DEVICE INSERTION Left 04/22/2015   Procedure: INSERTION OF HERO VASCULAR  ACCESS DEVICE;  Surgeon: Katha Cabal, MD;  Location: [facility];  Service: Vascular;  Laterality: Left;    Prior to Admission medications   Medication Sig Start Date End Date Taking? Authorizing Provider  acetaminophen (TYLENOL) 325 MG tablet Take 2 tablets (650 mg total) by mouth every 6  (six) hours as needed for mild pain, moderate pain or headache. Patient taking differently: Take 650 mg by mouth See admin instructions. Take 650 mg every Monday, Wednesday, and Friday before dialysis, may take 650 mg every 6 hours as needed for pain or headaches 10/01/16   Clapacs, Madie Reno, MD  albuterol (PROVENTIL HFA;VENTOLIN HFA) 108 (90 Base) MCG/ACT inhaler Inhale 2 puffs into the lungs every 4 (four) hours as needed for wheezing or shortness of breath.    [provider]  amLODipine (NORVASC) 5 MG tablet Take 5 mg by mouth daily.    [provider]  aspirin 81 MG chewable tablet Chew 1 tablet (81 mg total) by mouth daily. 10/02/16   Clapacs, Madie Reno, MD  B Complex-C-Folic Acid (RENA-VITE RX) 1 MG TABS Take 1 tablet by mouth daily. 10/01/16   Clapacs, Madie Reno, MD  calcium acetate (PHOSLO) 667 MG capsule Take 2 capsules (1,334 mg total) by mouth 2 (two) times daily with a meal. 10/01/16   Clapacs, Madie Reno, MD  cetaphil (CETAPHIL) lotion Apply 1 application topically daily.    [provider]  cinacalcet (SENSIPAR) 60 MG tablet Take 1 tablet (60 mg total) by mouth daily. 10/01/16   Clapacs, Madie Reno, MD  cloNIDine (CATAPRES) 0.1 MG tablet Take 1 tablet (0.1 mg total) by mouth 2 (two) times daily. Patient not taking: Reported on 10/07/2018 10/01/16   Clapacs, Madie Reno, MD  divalproex (DEPAKOTE) 500 MG DR tablet Take 2 tablets (1,000 mg total) by mouth at bedtime. Patient taking differently: Take 500-1,000 mg by mouth See admin instructions. Take 500 mg in the morning and 1000 mg at bedtime 10/01/16   Clapacs, Madie Reno, MD  docusate sodium (COLACE) 100 MG capsule Take 1 capsule (100 mg total) by mouth at bedtime. Patient not taking: Reported on 10/07/2018 10/01/16   Clapacs, Madie Reno, MD  escitalopram (LEXAPRO) 10 MG tablet Take 1.5 tablets (15 mg total) by mouth daily. Patient taking differently: Take 10 mg by mouth daily. Take with 5 mg tablet to equal 15 mg daily 10/02/16   Clapacs, Madie Reno, MD  escitalopram (LEXAPRO) 5 MG tablet Take 5 mg by mouth daily. Take with 10 mg tablet to equal 15 mg daily    [provider]  fluticasone (FLOVENT HFA) 110 MCG/ACT inhaler Inhale 2 puffs into the lungs 2 (two) times daily.    [provider]  folic acid (FOLVITE) 1 MG tablet Take 2 tablets (2 mg total) by mouth at bedtime. Patient not taking: Reported on 10/07/2018 10/01/16   Clapacs, Madie Reno, MD  glucagon (GLUCAGON EMERGENCY) 1 MG injection Inject 1 mg into the vein once as needed (low blood sugar).    [provider]  hydrALAZINE (APRESOLINE) 25 MG tablet Take 25 mg by mouth 3 (three) times daily.    [provider]  insulin aspart (NOVOLOG) 100 UNIT/ML injection Inject 0-15 Units into the skin 3 (three) times daily with meals. Per sliding scale     [provider]  insulin glargine (LANTUS) 100 UNIT/ML injection Inject 20 Units into the skin at bedtime.    [provider]  irbesartan (AVAPRO) 300 MG tablet Take  300 mg by mouth at bedtime.    [provider]  lamoTRIgine (LAMICTAL) 150 MG tablet Take 150 mg by mouth 2 (two) times daily.     [provider]  lidocaine-prilocaine (EMLA) cream Apply 1 application topically 3 (three) times a week. Apply to access site 30 minutes before dialysis. Patient taking differently: Apply 1 application topically every Monday, Wednesday, and Friday. Apply to access site 30 minutes before dialysis.  10/01/16   Clapacs, Madie Reno, MD  medroxyPROGESTERone (DEPO-PROVERA) 150 MG/ML injection Inject 150 mg into the muscle every 3 (three) months.    [provider]  metoCLOPramide (REGLAN) 10 MG tablet Take 10 mg by mouth 2 (two) times daily.     [provider]  metoprolol (LOPRESSOR) 50 MG tablet Take 1 tablet (50 mg total) by mouth 2 (two) times daily. 10/01/16   Clapacs, Madie Reno, MD  midodrine (PROAMATINE) 10 MG tablet Take 10 mg by mouth every Monday, Wednesday, and Friday.  Prior to dialysis    [provider]  Multiple Vitamin (THEREMS) TABS Take 1 tablet by mouth daily. 10/01/16   Clapacs, Madie Reno, MD  ondansetron (ZOFRAN) 4 MG tablet Take 4 mg by mouth 3 (three) times daily as needed for nausea or vomiting.    [provider]  oxyCODONE (OXY IR/ROXICODONE) 5 MG immediate release tablet Take 5 mg by mouth every 6 (six) hours as needed for severe pain.    [provider]  pantoprazole (PROTONIX) 40 MG tablet Take 40 mg by mouth daily.    [provider]  patiromer (VELTASSA) 8.4 g packet Take 1 packet (8.4 g total) by mouth daily. Patient taking differently: Take 8.4 g by mouth daily. Mixed into 1/3 cup of drink 10/01/16   Clapacs, Madie Reno, MD  QUEtiapine (SEROQUEL) 300 MG tablet Take 300 mg by mouth at bedtime.    [provider]  risperiDONE (RISPERDAL) 0.5 MG tablet Take 0.5 mg by mouth 2 (two) times daily.     [provider]  Tiotropium Bromide-Olodaterol (STIOLTO RESPIMAT IN) Inhale 2 puffs into the lungs daily.     [provider]  traZODone (DESYREL) 100 MG tablet Take 1 tablet (100 mg total) by mouth at bedtime. Patient not taking: Reported on 10/07/2018 10/01/16   Clapacs, Madie Reno, MD  vitamin B-12 (CYANOCOBALAMIN) 1000 MCG tablet Take 1 tablet (1,000 mcg total) by mouth at bedtime. Patient not taking: Reported on 10/07/2018 10/01/16   Clapacs, Madie Reno, MD    Allergies Patient has no known allergies.  No family history on file.  Social History Social History   Tobacco Use  . Smoking status: Current Every Day Smoker    Packs/day: 0.25    Years: 0.00    Pack years: 0.00    Types: Cigarettes  . Smokeless tobacco: Current User  Substance Use Topics  . Alcohol use: No  . Drug use: No     Review of Systems  Cardiovascular: No chest pain. Respiratory: No SOB. Gastrointestinal: No abdominal pain.  No vomiting.  Musculoskeletal: Negative for musculoskeletal pain. Skin: Negative for rash,  abrasions, lacerations, ecchymosis.   ____________________________________________   PHYSICAL EXAM:  VITAL SIGNS: ED Triage Vitals  Enc Vitals Group     BP 10/26/18 1522 (!) 124/48     Pulse Rate 10/26/18 1522 85     Resp 10/26/18 1522 16     Temp 10/26/18 1522 98.1 F (36.7 C)     Temp Source 10/26/18 1522 Oral  SpO2 10/26/18 1522 100 %     Weight 10/26/18 1523 198 lb 13.7 oz (90.2 kg)     Height 10/26/18 1523 5\' 4"  (1.626 m)     Head Circumference --      Peak Flow --      Pain Score 10/26/18 1523 7     Pain Loc --      Pain Edu? --      Excl. in [place]? --      Constitutional: Well appearing and in no acute distress. Eyes: Conjunctivae are normal. PERRL. EOMI. Head: Atraumatic. ENT:      Ears:      Nose: No congestion/rhinnorhea.      Mouth/Throat: Mucous membranes are moist.  Neck: No stridor.  No cervical spine tenderness to palpation. Cardiovascular: Normal rate, regular rhythm.  Good peripheral circulation. Respiratory: Normal respiratory effort without tachypnea or retractions. Lungs CTAB. Good air entry to the bases with no decreased or absent breath sounds. Musculoskeletal: Full range of motion to all extremities. No gross deformities appreciated. Neurologic:  No gross focal neurologic deficits are appreciated.  Skin:  Skin is warm, dry and intact. No rash noted.   ____________________________________________   LABS (all labs ordered are listed, but only abnormal results are displayed)  __________________________________________  EKG   ____________________________________________  RADIOLOGY I, Laban Emperor, personally viewed and evaluated these images (plain radiographs) as part of my medical decision making, as well as reviewing the written report by the radiologist.  Dg Chest 2 View  Result Date: 10/26/2018 CLINICAL DATA:  Sluggish with fatigue EXAM: CHEST - 2 VIEW COMPARISON:  07/26/2016 FINDINGS: Left-sided central vascular access with tip  over the SVC. Left axillary vascular stent. No focal airspace disease or effusion. Normal heart size. No pneumothorax. IMPRESSION: No active cardiopulmonary disease. Electronically Signed   By: Donavan Foil M.D.   On: 10/26/2018 17:22   Ct Head Wo Contrast  Result Date: 10/26/2018 CLINICAL DATA:  Fall backwards. EXAM: CT HEAD WITHOUT CONTRAST TECHNIQUE: Contiguous axial images were obtained from the base of the skull through the vertex without intravenous contrast. COMPARISON:  07/03/2016 FINDINGS: Brain: Ventricles, cisterns and other CSF spaces are within normal. There is low-attenuation over the periphery of the left cerebellar hemisphere likely representing subacute to chronic ischemic change or possibly related to abnormality over the transverse/sigmoid sinus. No evidence of mass, mass effect, shift of midline structures or acute hemorrhage. Vascular: No hyperdense vessel or unexpected calcification. Skull: Normal. Negative for fracture or focal lesion. Sinuses/Orbits: No acute finding. Other: None. IMPRESSION: Region of low-attenuation over the periphery of the left cerebellar hemisphere likely subacute to chronic ischemic change or possible abnormality related to the transverse/sigmoid sinus. Consider MRI for further evaluation. Electronically Signed   By: Marin Olp M.D.   On: 10/26/2018 17:28    ____________________________________________    PROCEDURES  Procedure(s) performed:    Procedures    Medications - No data to display   ____________________________________________   INITIAL IMPRESSION / ASSESSMENT AND PLAN / ED COURSE  Pertinent labs & imaging results that were available during my care of the patient were reviewed by me and considered in my medical decision making (see chart for details).  Review of the [place] CSRS was performed in accordance of the [place] prior to dispensing any controlled drugs.   Patient presented to the emergency department for evaluation of fall  today.  Caregiver also states that patient has been more fatigued than usual for the last 2 weeks and shuffling  her feet more the last 2 days.  CT head was ordered to evaluate any acute abnormalities from fall.  Chest x-ray and blood work was ordered to further evaluate fatigue and gait change.  Patient is uncooperative and not answering questions or cooperating with exam.  CT head results were discussed with Dr. Quentin Cornwall, who is in agreement to have patient transferred to main side ED for further evaluation.       ____________________________________________  FINAL CLINICAL IMPRESSION(S) / ED DIAGNOSES     NEW MEDICATIONS STARTED DURING THIS VISIT:  ED Discharge Orders    None          This chart was dictated using voice recognition software/Dragon. Despite best efforts to proofread, errors can occur which can change the meaning. Any change was purely unintentional.    Laban Emperor, PA-C 10/26/18 2159    Merlyn Lot, MD 10/26/18 2211

## 2018-10-26 NOTE — Discharge Instructions (Signed)
The CT head for fluoroscopy did not show any evidence of injury.  Blood work was reassuring.  Please follow-up with your primary doctor and return for any worsening symptoms including persistent headaches, behavior changes, weakness or for any additional questions or concerns.

## 2018-10-26 NOTE — ED Notes (Signed)
First Nurse Note: Pt brought to ER by [facility] in. Pt had a fall and hit her head. Per [place] pt appears to be off balance, pt was recently started on a new medication and they are unsure if the off balance is from new med, head injury, or pt baseline. Pt is in NAD at this time.

## 2018-10-26 NOTE — ED Notes (Addendum)
Legal guardian was notified and requested that d/c papers be faxed to her as well as sent to facility. This RN will sent d/c paper to [phone].

## 2018-10-26 NOTE — ED Provider Notes (Signed)
ED ECG REPORT I, Merlyn Lot, the attending physician, personally viewed and interpreted this ECG.   Date: 10/26/2018  EKG Time: 19:10  Rate: 80  Rhythm: sinus  Axis: normal   Intervals: normal intervals  ST&T Change: no peaked t waves    Clinical Course as of Oct 26 1921  Sun Oct 26, 2018  1803 TSH [PR]  1810 CBC [PR]  6213 Patient was able to ambulate up and down the ER with a steady gait.  Does not have any focal neuro deficits.  No new deficits.  Patient's caregiver who knows her well and states that she is currently acting at her baseline.  Does not seem clinically consistent with CVA.  May have a remote infarct.  Do not feel that emergent MRI clinically indicated.  Will check BMP to ensure the patient is not having any life-threatening hyperkalemia but otherwise will be stable for outpatient follow-up.   [PR]    Clinical Course User Index [PR] Merlyn Lot, MD    ----------------------------------------- 7:22 PM on 10/26/2018 -----------------------------------------  Blood work reassuring.  Denies any symptoms at this time.  Pleasant and interactive and at her baseline.  CT imaging does not show any evidence of acute trauma. I do believe she stable and appropriate for outpatient follow-up.   Merlyn Lot, MD 10/26/18 [phone]

## 2018-11-06 ENCOUNTER — Other Ambulatory Visit (INDEPENDENT_AMBULATORY_CARE_PROVIDER_SITE_OTHER): Payer: Self-pay | Admitting: Vascular Surgery

## 2018-11-06 ENCOUNTER — Ambulatory Visit (INDEPENDENT_AMBULATORY_CARE_PROVIDER_SITE_OTHER): Payer: Medicaid Other | Admitting: Vascular Surgery

## 2018-11-06 ENCOUNTER — Ambulatory Visit (INDEPENDENT_AMBULATORY_CARE_PROVIDER_SITE_OTHER): Payer: Medicaid Other

## 2018-11-06 ENCOUNTER — Encounter (INDEPENDENT_AMBULATORY_CARE_PROVIDER_SITE_OTHER): Payer: Self-pay | Admitting: Vascular Surgery

## 2018-11-06 VITALS — BP 120/73 | HR 89 | Resp 18 | Ht 62.0 in | Wt 204.2 lb

## 2018-11-06 DIAGNOSIS — Z95820 Peripheral vascular angioplasty status with implants and grafts: Secondary | ICD-10-CM

## 2018-11-06 DIAGNOSIS — T829XXD Unspecified complication of cardiac and vascular prosthetic device, implant and graft, subsequent encounter: Secondary | ICD-10-CM

## 2018-11-06 DIAGNOSIS — I120 Hypertensive chronic kidney disease with stage 5 chronic kidney disease or end stage renal disease: Secondary | ICD-10-CM | POA: Diagnosis not present

## 2018-11-06 DIAGNOSIS — N186 End stage renal disease: Secondary | ICD-10-CM

## 2018-11-06 DIAGNOSIS — T829XXS Unspecified complication of cardiac and vascular prosthetic device, implant and graft, sequela: Secondary | ICD-10-CM

## 2018-11-06 DIAGNOSIS — E1122 Type 2 diabetes mellitus with diabetic chronic kidney disease: Secondary | ICD-10-CM | POA: Diagnosis not present

## 2018-11-06 DIAGNOSIS — T829XXA Unspecified complication of cardiac and vascular prosthetic device, implant and graft, initial encounter: Secondary | ICD-10-CM | POA: Insufficient documentation

## 2018-11-06 DIAGNOSIS — Z992 Dependence on renal dialysis: Secondary | ICD-10-CM

## 2018-11-06 DIAGNOSIS — I10 Essential (primary) hypertension: Secondary | ICD-10-CM

## 2018-11-06 NOTE — Progress Notes (Signed)
MRN : [id number]  Kelly [name] is a 37 y.o. (1980/12/13) female who presents with chief complaint of  Chief Complaint  Patient presents with  . Follow-up  .  History of Present Illness:   The patient returns to the office for followup of their dialysis access. The function of the access has been stable. The patient denies increased bleeding time or increased recirculation. Patient denies difficulty with cannulation. The patient denies hand pain or other symptoms consistent with steal phenomena.  No significant arm swelling.  The patient denies redness or swelling at the access site. The patient denies fever or chills at home or while on dialysis.  The patient denies amaurosis fugax or recent TIA symptoms. There are no recent neurological changes noted. The patient denies claudication symptoms or rest pain symptoms. The patient denies history of DVT, PE or superficial thrombophlebitis. The patient denies recent episodes of angina or shortness of breath.   Duplex ultrasound of the AV access shows a patent access.  The previously noted stenosis is unchanged compared to last study.      Current Meds  Medication Sig  . acetaminophen (TYLENOL) 325 MG tablet Take 2 tablets (650 mg total) by mouth every 6 (six) hours as needed for mild pain, moderate pain or headache. (Patient taking differently: Take 650 mg by mouth See admin instructions. Take 650 mg every Monday, Wednesday, and Friday before dialysis, may take 650 mg every 6 hours as needed for pain or headaches)  . albuterol (PROVENTIL HFA;VENTOLIN HFA) 108 (90 Base) MCG/ACT inhaler Inhale 2 puffs into the lungs every 4 (four) hours as needed for wheezing or shortness of breath.  Marland Kitchen amLODipine (NORVASC) 5 MG tablet Take 5 mg by mouth daily.  Marland Kitchen aspirin 81 MG chewable tablet Chew 1 tablet (81 mg total) by mouth daily.  . B Complex-C-Folic Acid (RENA-VITE RX) 1 MG TABS Take 1 tablet by mouth daily.  . calcium acetate (PHOSLO) 667 MG  capsule Take 2 capsules (1,334 mg total) by mouth 2 (two) times daily with a meal.  . cetaphil (CETAPHIL) lotion Apply 1 application topically daily.  . cinacalcet (SENSIPAR) 60 MG tablet Take 1 tablet (60 mg total) by mouth daily.  . cloNIDine (CATAPRES) 0.1 MG tablet Take 1 tablet (0.1 mg total) by mouth 2 (two) times daily.  . divalproex (DEPAKOTE) 500 MG DR tablet Take 2 tablets (1,000 mg total) by mouth at bedtime. (Patient taking differently: Take 500-1,000 mg by mouth See admin instructions. Take 500 mg in the morning and 1000 mg at bedtime)  . docusate sodium (COLACE) 100 MG capsule Take 1 capsule (100 mg total) by mouth at bedtime.  Marland Kitchen escitalopram (LEXAPRO) 10 MG tablet Take 1.5 tablets (15 mg total) by mouth daily. (Patient taking differently: Take 10 mg by mouth daily. Take with 5 mg tablet to equal 15 mg daily)  . fluticasone (FLOVENT HFA) 110 MCG/ACT inhaler Inhale 2 puffs into the lungs 2 (two) times daily.  . folic acid (FOLVITE) 1 MG tablet Take 2 tablets (2 mg total) by mouth at bedtime.  Marland Kitchen glucagon (GLUCAGON EMERGENCY) 1 MG injection Inject 1 mg into the vein once as needed (low blood sugar).  . hydrALAZINE (APRESOLINE) 25 MG tablet Take 25 mg by mouth 3 (three) times daily.  . insulin aspart (NOVOLOG) 100 UNIT/ML injection Inject 0-15 Units into the skin 3 (three) times daily with meals. Per sliding scale   . insulin glargine (LANTUS) 100 UNIT/ML injection Inject 20 Units into  the skin at bedtime.  . irbesartan (AVAPRO) 300 MG tablet Take 300 mg by mouth at bedtime.  . lamoTRIgine (LAMICTAL) 150 MG tablet Take 150 mg by mouth 2 (two) times daily.   Marland Kitchen lidocaine-prilocaine (EMLA) cream Apply 1 application topically 3 (three) times a week. Apply to access site 30 minutes before dialysis. (Patient taking differently: Apply 1 application topically every Monday, Wednesday, and Friday. Apply to access site 30 minutes before dialysis. )  . medroxyPROGESTERone (DEPO-PROVERA) 150 MG/ML  injection Inject 150 mg into the muscle every 3 (three) months.  . metoCLOPramide (REGLAN) 10 MG tablet Take 10 mg by mouth 2 (two) times daily.   . metoprolol (LOPRESSOR) 50 MG tablet Take 1 tablet (50 mg total) by mouth 2 (two) times daily.  . midodrine (PROAMATINE) 10 MG tablet Take 10 mg by mouth every Monday, Wednesday, and Friday. Prior to dialysis  . Multiple Vitamin (THEREMS) TABS Take 1 tablet by mouth daily.  . ondansetron (ZOFRAN) 4 MG tablet Take 4 mg by mouth 3 (three) times daily as needed for nausea or vomiting.  Marland Kitchen oxyCODONE (OXY IR/ROXICODONE) 5 MG immediate release tablet Take 5 mg by mouth every 6 (six) hours as needed for severe pain.  . paliperidone (INVEGA SUSTENNA) 156 MG/ML SUSY injection Inject 156 mg into the muscle See admin instructions. EVERY 28 DAYS  . pantoprazole (PROTONIX) 40 MG tablet Take 40 mg by mouth daily.  . patiromer (VELTASSA) 8.4 g packet Take 1 packet (8.4 g total) by mouth daily. (Patient taking differently: Take 8.4 g by mouth daily. Mixed into 1/3 cup of drink)  . QUEtiapine (SEROQUEL) 300 MG tablet Take 300 mg by mouth at bedtime.  . risperiDONE (RISPERDAL) 0.5 MG tablet Take 0.5 mg by mouth 2 (two) times daily.   . Tiotropium Bromide-Olodaterol (STIOLTO RESPIMAT IN) Inhale 2 puffs into the lungs daily.   . traZODone (DESYREL) 100 MG tablet Take 1 tablet (100 mg total) by mouth at bedtime.  . vitamin B-12 (CYANOCOBALAMIN) 1000 MCG tablet Take 1 tablet (1,000 mcg total) by mouth at bedtime.  . [DISCONTINUED] escitalopram (LEXAPRO) 5 MG tablet Take 5 mg by mouth daily. Take with 10 mg tablet to equal 15 mg daily    Past Medical History:  Diagnosis Date  . Alcohol abuse    chronic  . Bipolar disorder ([place])   . Chronic kidney disease   . Cognitive changes   . Cognitive impairment   . Depression   . Depression   . Diabetes mellitus without complication ([place])   . Elevated lipids   . Hyperparathyroidism ([place])   . Hypertension   . Staph aureus  infection    A/V fistula  . UTI (lower urinary tract infection)     Past Surgical History:  Procedure Laterality Date  . A/V SHUNTOGRAM Left 10/07/2018   Procedure: A/V SHUNTOGRAM;  Surgeon: Katha Cabal, MD;  Location: [facility];  Service: Cardiovascular;  Laterality: Left;  . AV FISTULA PLACEMENT    . CHOLECYSTECTOMY    . dialysis perma cath Right    chest  . PERIPHERAL VASCULAR CATHETERIZATION N/A 05/24/2015   Procedure: Dialysis/Perma Catheter Removal;  Surgeon: Katha Cabal, MD;  Location: [facility];  Service: Cardiovascular;  Laterality: N/A;  . PERIPHERAL VASCULAR CATHETERIZATION Left 02/28/2016   Procedure: A/V Shuntogram/Fistulagram;  Surgeon: Katha Cabal, MD;  Location: [facility];  Service: Cardiovascular;  Laterality: Left;  . PERIPHERAL VASCULAR CATHETERIZATION N/A 02/28/2016   Procedure:  A/V Shunt Intervention;  Surgeon: Katha Cabal, MD;  Location: [facility];  Service: Cardiovascular;  Laterality: N/A;  . PERIPHERAL VASCULAR CATHETERIZATION N/A 06/26/2016   Procedure: A/V Shuntogram/Fistulagram;  Surgeon: Katha Cabal, MD;  Location: [facility];  Service: Cardiovascular;  Laterality: N/A;  . PERIPHERAL VASCULAR CATHETERIZATION N/A 09/10/2016   Procedure: A/V Shuntogram/Fistulagram;  Surgeon: Algernon Huxley, MD;  Location: [facility];  Service: Cardiovascular;  Laterality: N/A;  . PERIPHERAL VASCULAR CATHETERIZATION Left 09/14/2016   Procedure: Thrombectomy;  Surgeon: Katha Cabal, MD;  Location: [facility];  Service: Cardiovascular;  Laterality: Left;  Marland Kitchen VASCULAR ACCESS DEVICE INSERTION Left 04/22/2015   Procedure: INSERTION OF HERO VASCULAR ACCESS DEVICE;  Surgeon: Katha Cabal, MD;  Location: [facility];  Service: Vascular;  Laterality: Left;    Social History Social History   Tobacco Use  . Smoking status: Current Every Day Smoker    Packs/day: 0.25    Years: 0.00     Pack years: 0.00    Types: Cigarettes  . Smokeless tobacco: Current User  Substance Use Topics  . Alcohol use: No  . Drug use: No    Family History No family history on file.  No Known Allergies   REVIEW OF SYSTEMS (Negative unless checked)  Constitutional: [] Weight loss  [] Fever  [] Chills Cardiac: [] Chest pain   [] Chest pressure   [] Palpitations   [] Shortness of breath when laying flat   [] Shortness of breath with exertion. Vascular:  [] Pain in legs with walking   [] Pain in legs at rest  [] History of DVT   [] Phlebitis   [] Swelling in legs   [] Varicose veins   [] Non-healing ulcers Pulmonary:   [] Uses home oxygen   [] Productive cough   [] Hemoptysis   [] Wheeze  [] COPD   [] Asthma Neurologic:  [] Dizziness   [] Seizures   [] History of stroke   [] History of TIA  [] Aphasia   [] Vissual changes   [] Weakness or numbness in arm   [] Weakness or numbness in leg Musculoskeletal:   [] Joint swelling   [] Joint pain   [] Low back pain Hematologic:  [] Easy bruising  [] Easy bleeding   [] Hypercoagulable state   [] Anemic Gastrointestinal:  [] Diarrhea   [] Vomiting  [] Gastroesophageal reflux/heartburn   [] Difficulty swallowing. Genitourinary:  [x] Chronic kidney disease   [] Difficult urination  [] Frequent urination   [] Blood in urine Skin:  [] Rashes   [] Ulcers  Psychological:  [] History of anxiety   []  History of major depression.  Physical Examination  Vitals:   11/06/18 1517  BP: 120/73  Pulse: 89  Resp: 18  Weight: 204 lb 3.2 oz (92.6 kg)  Height: 5\' 2"  (1.575 m)   Body mass index is 37.35 kg/m. Gen: WD/WN, NAD Head: [place]/AT, No temporalis wasting.  Ear/Nose/Throat: Hearing grossly intact, nares w/o erythema or drainage Eyes: PER, EOMI, sclera nonicteric.  Neck: Supple, no large masses.   Pulmonary:  Good air movement, no audible wheezing bilaterally, no use of accessory muscles.  Cardiac: RRR, no JVD Vascular:  Left arm HERO graft good thrill good bruit Vessel Right Left  Radial Palpable  Palpable  Ulnar Palpable Palpable  Brachial Palpable Palpable  Gastrointestinal: Non-distended. No guarding/no peritoneal signs.  Musculoskeletal: M/S 5/5 throughout.  No deformity or atrophy.  Neurologic: CN 2-12 intact. Symmetrical.  Speech is fluent. Motor exam as listed above. Psychiatric: Judgment intact, Mood & affect appropriate for pt's clinical situation. Dermatologic: No rashes or ulcers noted.  No changes consistent with cellulitis. Lymph :  No lichenification or skin changes of chronic lymphedema.  CBC Lab Results  Component Value Date   WBC 4.7 07/24/2017   HGB 11.8 (L) 07/24/2017   HCT 34.7 (L) 07/24/2017   MCV 101.8 (H) 07/24/2017   PLT 167 07/24/2017    BMET    Component Value Date/Time   NA 140 10/26/2018 1856   NA 135 (L) 12/24/2014 0954   K 5.4 (H) 10/26/2018 1856   K 6.3 (H) 12/24/2014 0954   CL 99 10/26/2018 1856   CL 100 12/24/2014 0954   CO2 24 10/26/2018 1856   CO2 28 12/24/2014 0954   GLUCOSE 81 10/26/2018 1856   GLUCOSE 138 (H) 12/24/2014 0954   BUN 69 (H) 10/26/2018 1856   BUN 75 (H) 12/24/2014 0954   CREATININE 12.40 (H) 10/26/2018 1856   CREATININE 8.81 (H) 12/24/2014 0954   CALCIUM 9.3 10/26/2018 1856   CALCIUM 8.6 12/24/2014 0954   GFRNONAA 3 (L) 10/26/2018 1856   GFRNONAA 6 (L) 12/24/2014 0954   GFRNONAA 3 (L) 08/02/2014 1150   GFRAA 4 (L) 10/26/2018 1856   GFRAA 7 (L) 12/24/2014 0954   GFRAA 4 (L) 08/02/2014 1150   Estimated Creatinine Clearance: 6.6 mL/min (A) (by C-G formula based on SCr of 12.4 mg/dL (H)).  COAG Lab Results  Component Value Date   INR 1.00 04/19/2015    Radiology Dg Chest 2 View  Result Date: 10/26/2018 CLINICAL DATA:  Sluggish with fatigue EXAM: CHEST - 2 VIEW COMPARISON:  07/26/2016 FINDINGS: Left-sided central vascular access with tip over the SVC. Left axillary vascular stent. No focal airspace disease or effusion. Normal heart size. No pneumothorax. IMPRESSION: No active cardiopulmonary disease.  Electronically Signed   By: Donavan Foil M.D.   On: 10/26/2018 17:22   Ct Head Wo Contrast  Result Date: 10/26/2018 CLINICAL DATA:  Fall backwards. EXAM: CT HEAD WITHOUT CONTRAST TECHNIQUE: Contiguous axial images were obtained from the base of the skull through the vertex without intravenous contrast. COMPARISON:  07/03/2016 FINDINGS: Brain: Ventricles, cisterns and other CSF spaces are within normal. There is low-attenuation over the periphery of the left cerebellar hemisphere likely representing subacute to chronic ischemic change or possibly related to abnormality over the transverse/sigmoid sinus. No evidence of mass, mass effect, shift of midline structures or acute hemorrhage. Vascular: No hyperdense vessel or unexpected calcification. Skull: Normal. Negative for fracture or focal lesion. Sinuses/Orbits: No acute finding. Other: None. IMPRESSION: Region of low-attenuation over the periphery of the left cerebellar hemisphere likely subacute to chronic ischemic change or possible abnormality related to the transverse/sigmoid sinus. Consider MRI for further evaluation. Electronically Signed   By: Marin Olp M.D.   On: 10/26/2018 17:28     Assessment/Plan 1. Complication from renal dialysis device, sequela Recommend:  The patient is doing well and currently has adequate dialysis access. The patient's dialysis center is not reporting any access issues. Flow pattern is stable when compared to the prior ultrasound.  The patient should have a duplex ultrasound of the dialysis access in 6 months. The patient will follow-up with me in the office after each ultrasound   - VAS [place] (AVF, AVG); Future  2. ESRD on dialysis [facility]) Continue dialysis without further interruption   3. Type 2 diabetes mellitus with chronic kidney disease on chronic dialysis, without long-term current use of insulin (HCC) Continue hypoglycemic medications as already ordered, these medications have  been reviewed and there are no changes at this time.  Hgb A1C to be  monitored as already arranged by primary service   4. Essential hypertension Continue antihypertensive medications as already ordered, these medications have been reviewed and there are no changes at this time.   Hortencia Pilar, MD  11/06/2018 3:25 PM

## 2018-12-18 ENCOUNTER — Other Ambulatory Visit: Payer: Self-pay | Admitting: Family Medicine

## 2018-12-18 DIAGNOSIS — Z1231 Encounter for screening mammogram for malignant neoplasm of breast: Secondary | ICD-10-CM

## 2018-12-22 ENCOUNTER — Inpatient Hospital Stay
Admission: EM | Admit: 2018-12-22 | Discharge: 2018-12-26 | DRG: 193 | Disposition: A | Discharge status: Home / Self Care | Payer: Medicaid Other | Attending: Internal Medicine | Admitting: Internal Medicine

## 2018-12-22 ENCOUNTER — Encounter: Payer: Self-pay | Admitting: Emergency Medicine

## 2018-12-22 ENCOUNTER — Emergency Department: Payer: Medicaid Other

## 2018-12-22 ENCOUNTER — Other Ambulatory Visit: Payer: Self-pay

## 2018-12-22 DIAGNOSIS — I120 Hypertensive chronic kidney disease with stage 5 chronic kidney disease or end stage renal disease: Secondary | ICD-10-CM | POA: Diagnosis present

## 2018-12-22 DIAGNOSIS — Z794 Long term (current) use of insulin: Secondary | ICD-10-CM | POA: Diagnosis not present

## 2018-12-22 DIAGNOSIS — D631 Anemia in chronic kidney disease: Secondary | ICD-10-CM | POA: Diagnosis present

## 2018-12-22 DIAGNOSIS — R4189 Other symptoms and signs involving cognitive functions and awareness: Secondary | ICD-10-CM | POA: Diagnosis present

## 2018-12-22 DIAGNOSIS — I9589 Other hypotension: Secondary | ICD-10-CM | POA: Diagnosis present

## 2018-12-22 DIAGNOSIS — Z992 Dependence on renal dialysis: Secondary | ICD-10-CM

## 2018-12-22 DIAGNOSIS — N186 End stage renal disease: Secondary | ICD-10-CM | POA: Diagnosis present

## 2018-12-22 DIAGNOSIS — J101 Influenza due to other identified influenza virus with other respiratory manifestations: Secondary | ICD-10-CM | POA: Diagnosis not present

## 2018-12-22 DIAGNOSIS — F319 Bipolar disorder, unspecified: Secondary | ICD-10-CM | POA: Diagnosis present

## 2018-12-22 DIAGNOSIS — E876 Hypokalemia: Secondary | ICD-10-CM | POA: Diagnosis present

## 2018-12-22 DIAGNOSIS — E1122 Type 2 diabetes mellitus with diabetic chronic kidney disease: Secondary | ICD-10-CM | POA: Diagnosis present

## 2018-12-22 DIAGNOSIS — I953 Hypotension of hemodialysis: Secondary | ICD-10-CM | POA: Diagnosis present

## 2018-12-22 DIAGNOSIS — N2581 Secondary hyperparathyroidism of renal origin: Secondary | ICD-10-CM | POA: Diagnosis present

## 2018-12-22 DIAGNOSIS — F17210 Nicotine dependence, cigarettes, uncomplicated: Secondary | ICD-10-CM | POA: Diagnosis present

## 2018-12-22 DIAGNOSIS — I959 Hypotension, unspecified: Secondary | ICD-10-CM

## 2018-12-22 LAB — LACTIC ACID, PLASMA: Lactic Acid, Venous: 0.8 mmol/L (ref 0.5–1.9)

## 2018-12-22 LAB — CBC WITH DIFFERENTIAL/PLATELET
Abs Immature Granulocytes: 0.05 10*3/uL (ref 0.00–0.07)
BASOS ABS: 0 10*3/uL (ref 0.0–0.1)
Basophils Relative: 0 %
EOS PCT: 3 %
Eosinophils Absolute: 0.2 10*3/uL (ref 0.0–0.5)
HEMATOCRIT: 25.9 % — AB (ref 36.0–46.0)
Hemoglobin: 8.1 g/dL — ABNORMAL LOW (ref 12.0–15.0)
Immature Granulocytes: 1 %
LYMPHS ABS: 0.9 10*3/uL (ref 0.7–4.0)
LYMPHS PCT: 14 %
MCH: 34.2 pg — ABNORMAL HIGH (ref 26.0–34.0)
MCHC: 31.3 g/dL (ref 30.0–36.0)
MCV: 109.3 fL — AB (ref 80.0–100.0)
Monocytes Absolute: 0.4 10*3/uL (ref 0.1–1.0)
Monocytes Relative: 7 %
NRBC: 0 % (ref 0.0–0.2)
Neutro Abs: 4.9 10*3/uL (ref 1.7–7.7)
Neutrophils Relative %: 75 %
Platelets: 107 10*3/uL — ABNORMAL LOW (ref 150–400)
RBC: 2.37 MIL/uL — ABNORMAL LOW (ref 3.87–5.11)
RDW: 16.1 % — ABNORMAL HIGH (ref 11.5–15.5)
WBC: 6.5 10*3/uL (ref 4.0–10.5)

## 2018-12-22 LAB — INFLUENZA PANEL BY PCR (TYPE A & B)
INFLAPCR: POSITIVE — AB
Influenza B By PCR: NEGATIVE

## 2018-12-22 LAB — TROPONIN I: Troponin I: 0.03 ng/mL (ref <0.03)

## 2018-12-22 LAB — BASIC METABOLIC PANEL
Anion gap: 10 (ref 5–15)
BUN: 23 mg/dL — ABNORMAL HIGH (ref 6–20)
CHLORIDE: 97 mmol/L — AB (ref 98–111)
CO2: 30 mmol/L (ref 22–32)
CREATININE: 6.14 mg/dL — AB (ref 0.44–1.00)
Calcium: 8.4 mg/dL — ABNORMAL LOW (ref 8.9–10.3)
GFR calc non Af Amer: 8 mL/min — ABNORMAL LOW (ref >60)
GFR, EST AFRICAN AMERICAN: 9 mL/min — AB (ref >60)
Glucose, Bld: 106 mg/dL — ABNORMAL HIGH (ref 70–99)
Potassium: 3 mmol/L — ABNORMAL LOW (ref 3.5–5.1)
Sodium: 137 mmol/L (ref 135–145)

## 2018-12-22 LAB — GLUCOSE, CAPILLARY
Glucose-Capillary: 77 mg/dL (ref 70–99)
Glucose-Capillary: 149 mg/dL — ABNORMAL HIGH (ref 70–99)

## 2018-12-22 MED ORDER — CINACALCET HCL 30 MG PO TABS
60.0000 mg | ORAL_TABLET | Freq: Every day | ORAL | Status: DC
  Administered 2018-12-23 – 2018-12-25 (×2): 60 mg via ORAL
  Filled 2018-12-22 (×5): qty 2

## 2018-12-22 MED ORDER — ACETAMINOPHEN 325 MG PO TABS
650.0000 mg | ORAL_TABLET | Freq: Four times a day (QID) | ORAL | Status: DC | PRN
  Administered 2018-12-23: 650 mg via ORAL
  Filled 2018-12-22: qty 2

## 2018-12-22 MED ORDER — OSELTAMIVIR PHOSPHATE 75 MG PO CAPS
75.0000 mg | ORAL_CAPSULE | Freq: Once | ORAL | Status: DC
  Filled 2018-12-22: qty 1

## 2018-12-22 MED ORDER — BUDESONIDE 0.25 MG/2ML IN SUSP
0.2500 mg | Freq: Two times a day (BID) | RESPIRATORY_TRACT | Status: DC
  Administered 2018-12-22 – 2018-12-26 (×9): 0.25 mg via RESPIRATORY_TRACT
  Filled 2018-12-22 (×9): qty 2

## 2018-12-22 MED ORDER — ONDANSETRON HCL 4 MG PO TABS
4.0000 mg | ORAL_TABLET | Freq: Three times a day (TID) | ORAL | Status: DC | PRN
  Filled 2018-12-22 (×2): qty 1

## 2018-12-22 MED ORDER — INSULIN ASPART 100 UNIT/ML ~~LOC~~ SOLN
0.0000 [IU] | Freq: Three times a day (TID) | SUBCUTANEOUS | Status: DC
  Administered 2018-12-23: 18:00:00 1 [IU] via SUBCUTANEOUS
  Administered 2018-12-25: 09:00:00 2 [IU] via SUBCUTANEOUS
  Filled 2018-12-22 (×3): qty 1

## 2018-12-22 MED ORDER — ADULT MULTIVITAMIN W/MINERALS CH
1.0000 | ORAL_TABLET | Freq: Every day | ORAL | Status: DC
  Administered 2018-12-23 – 2018-12-25 (×3): 1 via ORAL
  Filled 2018-12-22 (×3): qty 1

## 2018-12-22 MED ORDER — DIVALPROEX SODIUM 500 MG PO DR TAB
1000.0000 mg | DELAYED_RELEASE_TABLET | Freq: Every day | ORAL | Status: DC
  Administered 2018-12-23 – 2018-12-25 (×3): 1000 mg via ORAL
  Filled 2018-12-22 (×5): qty 2

## 2018-12-22 MED ORDER — ESCITALOPRAM OXALATE 10 MG PO TABS
10.0000 mg | ORAL_TABLET | Freq: Every day | ORAL | Status: DC
  Administered 2018-12-23 – 2018-12-25 (×3): 10 mg via ORAL
  Filled 2018-12-22 (×4): qty 1

## 2018-12-22 MED ORDER — DOCUSATE SODIUM 100 MG PO CAPS
100.0000 mg | ORAL_CAPSULE | Freq: Every day | ORAL | Status: DC
  Administered 2018-12-23 – 2018-12-25 (×3): 100 mg via ORAL
  Filled 2018-12-22 (×4): qty 1

## 2018-12-22 MED ORDER — OSELTAMIVIR PHOSPHATE 30 MG PO CAPS
30.0000 mg | ORAL_CAPSULE | Freq: Once | ORAL | Status: AC
  Administered 2018-12-22: 30 mg via ORAL
  Filled 2018-12-22: qty 1

## 2018-12-22 MED ORDER — ALBUTEROL SULFATE (2.5 MG/3ML) 0.083% IN NEBU
2.5000 mg | INHALATION_SOLUTION | Freq: Once | RESPIRATORY_TRACT | Status: AC
  Administered 2018-12-22: 2.5 mg via RESPIRATORY_TRACT
  Filled 2018-12-22: qty 3

## 2018-12-22 MED ORDER — PANTOPRAZOLE SODIUM 40 MG PO TBEC
40.0000 mg | DELAYED_RELEASE_TABLET | Freq: Every day | ORAL | Status: DC
  Administered 2018-12-23 – 2018-12-25 (×2): 40 mg via ORAL
  Filled 2018-12-22 (×2): qty 1

## 2018-12-22 MED ORDER — QUETIAPINE FUMARATE 300 MG PO TABS
300.0000 mg | ORAL_TABLET | Freq: Every day | ORAL | Status: DC
  Administered 2018-12-23 – 2018-12-25 (×3): 300 mg via ORAL
  Filled 2018-12-22 (×5): qty 1

## 2018-12-22 MED ORDER — MIDODRINE HCL 5 MG PO TABS
10.0000 mg | ORAL_TABLET | ORAL | Status: DC
  Administered 2018-12-26: 08:00:00 10 mg via ORAL
  Filled 2018-12-22 (×2): qty 2

## 2018-12-22 MED ORDER — CALCIUM ACETATE (PHOS BINDER) 667 MG PO CAPS
1334.0000 mg | ORAL_CAPSULE | Freq: Two times a day (BID) | ORAL | Status: DC
  Administered 2018-12-22 – 2018-12-25 (×4): 1334 mg via ORAL
  Filled 2018-12-22 (×10): qty 2

## 2018-12-22 MED ORDER — DIVALPROEX SODIUM 500 MG PO DR TAB: 500.0000 mg | DELAYED_RELEASE_TABLET | ORAL | Status: DC

## 2018-12-22 MED ORDER — LAMOTRIGINE 25 MG PO TABS
150.0000 mg | ORAL_TABLET | Freq: Two times a day (BID) | ORAL | Status: DC
  Administered 2018-12-23 – 2018-12-25 (×5): 150 mg via ORAL
  Filled 2018-12-22 (×6): qty 1

## 2018-12-22 MED ORDER — INSULIN ASPART 100 UNIT/ML ~~LOC~~ SOLN
0.0000 [IU] | Freq: Every day | SUBCUTANEOUS | Status: DC
  Administered 2018-12-24: 20:00:00 3 [IU] via SUBCUTANEOUS
  Filled 2018-12-22: qty 1

## 2018-12-22 MED ORDER — SODIUM CHLORIDE 0.9 % IV BOLUS
500.0000 mL | Freq: Once | INTRAVENOUS | Status: AC
  Administered 2018-12-22: 500 mL via INTRAVENOUS

## 2018-12-22 MED ORDER — ARFORMOTEROL TARTRATE 15 MCG/2ML IN NEBU
15.0000 ug | INHALATION_SOLUTION | Freq: Two times a day (BID) | RESPIRATORY_TRACT | Status: DC
  Administered 2018-12-23 – 2018-12-26 (×8): 15 ug via RESPIRATORY_TRACT
  Filled 2018-12-22 (×10): qty 2

## 2018-12-22 MED ORDER — DIVALPROEX SODIUM 500 MG PO DR TAB
500.0000 mg | DELAYED_RELEASE_TABLET | Freq: Every morning | ORAL | Status: DC
  Administered 2018-12-23 – 2018-12-25 (×3): 500 mg via ORAL
  Filled 2018-12-22 (×4): qty 1

## 2018-12-22 MED ORDER — OXYCODONE HCL 5 MG PO TABS: 5.0000 mg | ORAL_TABLET | Freq: Four times a day (QID) | ORAL | Status: DC | PRN

## 2018-12-22 MED ORDER — VITAMIN B-12 1000 MCG PO TABS
1000.0000 ug | ORAL_TABLET | Freq: Every day | ORAL | Status: DC
  Filled 2018-12-22: qty 1

## 2018-12-22 MED ORDER — ASPIRIN 81 MG PO CHEW
81.0000 mg | CHEWABLE_TABLET | Freq: Every day | ORAL | Status: DC
  Administered 2018-12-23 – 2018-12-25 (×3): 81 mg via ORAL
  Filled 2018-12-22 (×3): qty 1

## 2018-12-22 MED ORDER — TRAZODONE HCL 50 MG PO TABS
100.0000 mg | ORAL_TABLET | Freq: Every day | ORAL | Status: DC
  Administered 2018-12-23 – 2018-12-25 (×3): 100 mg via ORAL
  Filled 2018-12-22 (×4): qty 2

## 2018-12-22 MED ORDER — OSELTAMIVIR PHOSPHATE 30 MG PO CAPS
30.0000 mg | ORAL_CAPSULE | ORAL | Status: AC
  Administered 2018-12-24 – 2018-12-26 (×2): 30 mg via ORAL
  Filled 2018-12-22 (×2): qty 1

## 2018-12-22 MED ORDER — FLUTICASONE PROPIONATE HFA 110 MCG/ACT IN AERO: 2.0000 | INHALATION_SPRAY | Freq: Two times a day (BID) | RESPIRATORY_TRACT | Status: DC

## 2018-12-22 MED ORDER — UMECLIDINIUM BROMIDE 62.5 MCG/INH IN AEPB
1.0000 | INHALATION_SPRAY | Freq: Every day | RESPIRATORY_TRACT | Status: DC
  Administered 2018-12-23: 1 via RESPIRATORY_TRACT
  Filled 2018-12-22: qty 7

## 2018-12-22 MED ORDER — INSULIN GLARGINE 100 UNIT/ML ~~LOC~~ SOLN
20.0000 [IU] | Freq: Every day | SUBCUTANEOUS | Status: DC
  Administered 2018-12-22 – 2018-12-25 (×4): 20 [IU] via SUBCUTANEOUS
  Filled 2018-12-22 (×5): qty 0.2

## 2018-12-22 MED ORDER — TIOTROPIUM BROMIDE-OLODATEROL 2.5-2.5 MCG/ACT IN AERS: INHALATION_SPRAY | Freq: Every day | RESPIRATORY_TRACT | Status: DC

## 2018-12-22 MED ORDER — GLUCAGON HCL RDNA (DIAGNOSTIC) 1 MG IJ SOLR: 1.0000 mg | Freq: Once | INTRAMUSCULAR | Status: DC | PRN

## 2018-12-22 MED ORDER — DOCUSATE SODIUM 100 MG PO CAPS: 100.0000 mg | ORAL_CAPSULE | Freq: Two times a day (BID) | ORAL | Status: DC | PRN

## 2018-12-22 MED ORDER — ALBUTEROL SULFATE (2.5 MG/3ML) 0.083% IN NEBU
3.0000 mL | INHALATION_SOLUTION | RESPIRATORY_TRACT | Status: DC | PRN
  Filled 2018-12-22: qty 3

## 2018-12-22 MED ORDER — RENA-VITE PO TABS
1.0000 | ORAL_TABLET | Freq: Every day | ORAL | Status: DC
  Administered 2018-12-23 – 2018-12-25 (×2): 1 via ORAL
  Filled 2018-12-22 (×4): qty 1

## 2018-12-22 MED ORDER — HEPARIN SODIUM (PORCINE) 5000 UNIT/ML IJ SOLN
5000.0000 [IU] | Freq: Three times a day (TID) | INTRAMUSCULAR | Status: DC
  Administered 2018-12-22 – 2018-12-26 (×6): 5000 [IU] via SUBCUTANEOUS
  Filled 2018-12-22 (×8): qty 1

## 2018-12-22 MED ORDER — FOLIC ACID 1 MG PO TABS
2.0000 mg | ORAL_TABLET | Freq: Every day | ORAL | Status: DC
  Administered 2018-12-23 – 2018-12-25 (×3): 2 mg via ORAL
  Filled 2018-12-22 (×4): qty 2

## 2018-12-22 MED ORDER — RISPERIDONE 0.5 MG PO TABS
0.5000 mg | ORAL_TABLET | Freq: Two times a day (BID) | ORAL | Status: DC
  Administered 2018-12-23 – 2018-12-25 (×5): 0.5 mg via ORAL
  Filled 2018-12-22 (×9): qty 1

## 2018-12-22 NOTE — H&P (Addendum)
[facility] NAME: Kelly [name]    MR#:  [id number]  DATE OF BIRTH:  08/06/1981  DATE OF ADMISSION:  12/22/2018  PRIMARY CARE PHYSICIAN: Lorelee Market, MD   REQUESTING/REFERRING PHYSICIAN: Seidacki  CHIEF COMPLAINT:   Chief Complaint  Patient presents with  . Cough  . Weakness    HISTORY OF PRESENT ILLNESS: Kelly [name]  is a 38 y.o. female with a known history of alcohol abuse, bipolar disorder, chronic kidney disease on hemodialysis, cognitive impairment and lives in group home, diabetes without complications and on insulin at home, hyperparathyroidism, hypertension, AV fistula and UTI-was sent from hemodialysis center to the emergency room because of hypotension noted during the dialysis session.  As per patient she finished today's full hemodialysis session. Due to her cognitive impairment she is not able to give much details about her symptoms.  She denies cough or fever but states that her legs are hurting and could not move much. In emergency room she is noted to have influenza A, ER physician gave her admission as she was noted to be hypotensive in the emergency room and required a bolus of IV fluids.  Blood pressure was stable when I saw.  PAST MEDICAL HISTORY:   Past Medical History:  Diagnosis Date  . Alcohol abuse    chronic  . Bipolar disorder ([place])   . Chronic kidney disease   . Cognitive changes   . Cognitive impairment   . Depression   . Depression   . Diabetes mellitus without complication ([place])   . Elevated lipids   . Hyperparathyroidism ([place])   . Hypertension   . Staph aureus infection    A/V fistula  . UTI (lower urinary tract infection)     PAST SURGICAL HISTORY:  Past Surgical History:  Procedure Laterality Date  . A/V SHUNTOGRAM Left 10/07/2018   Procedure: A/V SHUNTOGRAM;  Surgeon: Katha Cabal, MD;  Location: [facility] [place] [facility];  Service: Cardiovascular;  Laterality: Left;  .  AV FISTULA PLACEMENT    . CHOLECYSTECTOMY    . dialysis perma cath Right    chest  . PERIPHERAL VASCULAR CATHETERIZATION N/A 05/24/2015   Procedure: Dialysis/Perma Catheter Removal;  Surgeon: Katha Cabal, MD;  Location: [place] [facility];  Service: Cardiovascular;  Laterality: N/A;  . PERIPHERAL VASCULAR CATHETERIZATION Left 02/28/2016   Procedure: A/V Shuntogram/Fistulagram;  Surgeon: Katha Cabal, MD;  Location: [place];  Service: Cardiovascular;  Laterality: Left;  . PERIPHERAL VASCULAR CATHETERIZATION N/A 02/28/2016   Procedure: A/V Shunt Intervention;  Surgeon: Katha Cabal, MD;  Location: [place] [facility];  Service: Cardiovascular;  Laterality: N/A;  . PERIPHERAL VASCULAR CATHETERIZATION N/A 06/26/2016   Procedure: A/V Shuntogram/Fistulagram;  Surgeon: Katha Cabal, MD;  Location: [facility];  Service: Cardiovascular;  Laterality: N/A;  . PERIPHERAL VASCULAR CATHETERIZATION N/A 09/10/2016   Procedure: A/V Shuntogram/Fistulagram;  Surgeon: Algernon Huxley, MD;  Location: [place] [facility];  Service: Cardiovascular;  Laterality: N/A;  . PERIPHERAL VASCULAR CATHETERIZATION Left 09/14/2016   Procedure: Thrombectomy;  Surgeon: Katha Cabal, MD;  Location: [place] [facility];  Service: Cardiovascular;  Laterality: Left;  Marland Kitchen VASCULAR ACCESS DEVICE INSERTION Left 04/22/2015   Procedure: INSERTION OF HERO VASCULAR ACCESS DEVICE;  Surgeon: Katha Cabal, MD;  Location: [facility];  Service: Vascular;  Laterality: Left;    SOCIAL HISTORY:  Social History   Tobacco Use  . Smoking status:  Current Every Day Smoker    Packs/day: 0.25    Years: 0.00    Pack years: 0.00    Types: Cigarettes  . Smokeless tobacco: Current User  Substance Use Topics  . Alcohol use: No    FAMILY HISTORY:  Family History  Family history unknown: Yes    DRUG ALLERGIES: No Known Allergies  REVIEW OF SYSTEMS:   CONSTITUTIONAL: No fever, have fatigue or  weakness.  EYES: No blurred or double vision.  EARS, NOSE, AND THROAT: No tinnitus or ear pain.  RESPIRATORY: No cough, shortness of breath, wheezing or hemoptysis.  CARDIOVASCULAR: No chest pain, orthopnea, edema.  GASTROINTESTINAL: No nausea, vomiting, diarrhea or abdominal pain.  GENITOURINARY: No dysuria, hematuria.  ENDOCRINE: No polyuria, nocturia,  HEMATOLOGY: No anemia, easy bruising or bleeding SKIN: No rash or lesion. MUSCULOSKELETAL: No joint pain or arthritis.   NEUROLOGIC: No tingling, numbness, weakness.  PSYCHIATRY: No anxiety or depression.   MEDICATIONS AT HOME:  Prior to Admission medications   Medication Sig Start Date End Date Taking? Authorizing Provider  acetaminophen (TYLENOL) 325 MG tablet Take 2 tablets (650 mg total) by mouth every 6 (six) hours as needed for mild pain, moderate pain or headache. Patient taking differently: Take 650 mg by mouth See admin instructions. Take 650 mg every Monday, Wednesday, and Friday before dialysis, may take 650 mg every 6 hours as needed for pain or headaches 10/01/16   Clapacs, Madie Reno, MD  albuterol (PROVENTIL HFA;VENTOLIN HFA) 108 (90 Base) MCG/ACT inhaler Inhale 2 puffs into the lungs every 4 (four) hours as needed for wheezing or shortness of breath.    [provider]  amLODipine (NORVASC) 5 MG tablet Take 5 mg by mouth daily.    [provider]  aspirin 81 MG chewable tablet Chew 1 tablet (81 mg total) by mouth daily. 10/02/16   Clapacs, Madie Reno, MD  B Complex-C-Folic Acid (RENA-VITE RX) 1 MG TABS Take 1 tablet by mouth daily. 10/01/16   Clapacs, Madie Reno, MD  calcium acetate (PHOSLO) 667 MG capsule Take 2 capsules (1,334 mg total) by mouth 2 (two) times daily with a meal. 10/01/16   Clapacs, Madie Reno, MD  cetaphil (CETAPHIL) lotion Apply 1 application topically daily.    [provider]  cinacalcet (SENSIPAR) 60 MG tablet Take 1 tablet (60 mg total) by mouth daily. 10/01/16   Clapacs, Madie Reno, MD  cloNIDine  (CATAPRES) 0.1 MG tablet Take 1 tablet (0.1 mg total) by mouth 2 (two) times daily. 10/01/16   Clapacs, Madie Reno, MD  divalproex (DEPAKOTE) 500 MG DR tablet Take 2 tablets (1,000 mg total) by mouth at bedtime. Patient taking differently: Take 500-1,000 mg by mouth See admin instructions. Take 500 mg in the morning and 1000 mg at bedtime 10/01/16   Clapacs, Madie Reno, MD  docusate sodium (COLACE) 100 MG capsule Take 1 capsule (100 mg total) by mouth at bedtime. 10/01/16   Clapacs, Madie Reno, MD  escitalopram (LEXAPRO) 10 MG tablet Take 1.5 tablets (15 mg total) by mouth daily. Patient taking differently: Take 10 mg by mouth daily. Take with 5 mg tablet to equal 15 mg daily 10/02/16   Clapacs, Madie Reno, MD  fluticasone (FLOVENT HFA) 110 MCG/ACT inhaler Inhale 2 puffs into the lungs 2 (two) times daily.    [provider]  folic acid (FOLVITE) 1 MG tablet Take 2 tablets (2 mg total) by mouth at bedtime. 10/01/16   Clapacs, Madie Reno, MD  glucagon (GLUCAGON EMERGENCY) 1  MG injection Inject 1 mg into the vein once as needed (low blood sugar).    [provider]  hydrALAZINE (APRESOLINE) 25 MG tablet Take 25 mg by mouth 3 (three) times daily.    [provider]  insulin aspart (NOVOLOG) 100 UNIT/ML injection Inject 0-15 Units into the skin 3 (three) times daily with meals. Per sliding scale     [provider]  insulin glargine (LANTUS) 100 UNIT/ML injection Inject 20 Units into the skin at bedtime.    [provider]  irbesartan (AVAPRO) 300 MG tablet Take 300 mg by mouth at bedtime.    [provider]  lamoTRIgine (LAMICTAL) 150 MG tablet Take 150 mg by mouth 2 (two) times daily.     [provider]  lidocaine-prilocaine (EMLA) cream Apply 1 application topically 3 (three) times a week. Apply to access site 30 minutes before dialysis. Patient taking differently: Apply 1 application topically every Monday, Wednesday, and Friday. Apply to access site 30 minutes  before dialysis.  10/01/16   Clapacs, Madie Reno, MD  medroxyPROGESTERone (DEPO-PROVERA) 150 MG/ML injection Inject 150 mg into the muscle every 3 (three) months.    [provider]  metoCLOPramide (REGLAN) 10 MG tablet Take 10 mg by mouth 2 (two) times daily.     [provider]  metoprolol (LOPRESSOR) 50 MG tablet Take 1 tablet (50 mg total) by mouth 2 (two) times daily. 10/01/16   Clapacs, Madie Reno, MD  midodrine (PROAMATINE) 10 MG tablet Take 10 mg by mouth every Monday, Wednesday, and Friday. Prior to dialysis    [provider]  Multiple Vitamin (THEREMS) TABS Take 1 tablet by mouth daily. 10/01/16   Clapacs, Madie Reno, MD  ondansetron (ZOFRAN) 4 MG tablet Take 4 mg by mouth 3 (three) times daily as needed for nausea or vomiting.    [provider]  oxyCODONE (OXY IR/ROXICODONE) 5 MG immediate release tablet Take 5 mg by mouth every 6 (six) hours as needed for severe pain.    [provider]  paliperidone (INVEGA SUSTENNA) 156 MG/ML SUSY injection Inject 156 mg into the muscle See admin instructions. EVERY 28 DAYS    [provider]  pantoprazole (PROTONIX) 40 MG tablet Take 40 mg by mouth daily.    [provider]  patiromer (VELTASSA) 8.4 g packet Take 1 packet (8.4 g total) by mouth daily. Patient taking differently: Take 8.4 g by mouth daily. Mixed into 1/3 cup of drink 10/01/16   Clapacs, Madie Reno, MD  QUEtiapine (SEROQUEL) 300 MG tablet Take 300 mg by mouth at bedtime.    [provider]  risperiDONE (RISPERDAL) 0.5 MG tablet Take 0.5 mg by mouth 2 (two) times daily.     [provider]  Tiotropium Bromide-Olodaterol (STIOLTO RESPIMAT IN) Inhale 2 puffs into the lungs daily.     [provider]  traZODone (DESYREL) 100 MG tablet Take 1 tablet (100 mg total) by mouth at bedtime. 10/01/16   Clapacs, Madie Reno, MD  vitamin B-12 (CYANOCOBALAMIN) 1000 MCG tablet Take 1 tablet (1,000 mcg total) by mouth at bedtime. 10/01/16    Clapacs, Madie Reno, MD      PHYSICAL EXAMINATION:   VITAL SIGNS: Blood pressure (!) 76/51, pulse 85, temperature 98.1 F (36.7 C), temperature source Oral, resp. rate 20, weight 92.7 kg, SpO2 98 %.  GENERAL:  38 y.o.-year-old patient lying in the bed with no acute distress.  EYES: Pupils equal, round, reactive to light and accommodation. No scleral  icterus. Extraocular muscles intact.  HEENT: Head atraumatic, normocephalic. Oropharynx and nasopharynx clear.  NECK:  Supple, no jugular venous distention. No thyroid enlargement, no tenderness.  LUNGS: Normal breath sounds bilaterally, no wheezing, rales,rhonchi or crepitation. No use of accessory muscles of respiration.  CARDIOVASCULAR: S1, S2 normal. No murmurs, rubs, or gallops.  ABDOMEN: Soft, nontender, nondistended. Bowel sounds present. No organomegaly or mass.  EXTREMITIES: No pedal edema, cyanosis, or clubbing.  Left arm dialysis fistula present. NEUROLOGIC: Cranial nerves II through XII are intact. Muscle strength 2-3/5 in all extremities. Sensation intact. Gait not checked.  PSYCHIATRIC: The patient is alert and oriented x 3.  But appears mentally slow and not giving detailed answers. SKIN: No obvious rash, lesion, or ulcer.   LABORATORY PANEL:   CBC Recent Labs  Lab 12/22/18 1206  WBC 6.5  HGB 8.1*  HCT 25.9*  PLT 107*  MCV 109.3*  MCH 34.2*  MCHC 31.3  RDW 16.1*  LYMPHSABS 0.9  MONOABS 0.4  EOSABS 0.2  BASOSABS 0.0   ------------------------------------------------------------------------------------------------------------------  Chemistries  Recent Labs  Lab 12/22/18 1206  NA 137  K 3.0*  CL 97*  CO2 30  GLUCOSE 106*  BUN 23*  CREATININE 6.14*  CALCIUM 8.4*   ------------------------------------------------------------------------------------------------------------------ estimated creatinine clearance is 13.4 mL/min (A) (by C-G formula based on SCr of 6.14 mg/dL  (H)). ------------------------------------------------------------------------------------------------------------------ No results for input(s): TSH, T4TOTAL, T3FREE, THYROIDAB in the last 72 hours.  Invalid input(s): FREET3   Coagulation profile No results for input(s): INR, PROTIME in the last 168 hours. ------------------------------------------------------------------------------------------------------------------- No results for input(s): DDIMER in the last 72 hours. -------------------------------------------------------------------------------------------------------------------  Cardiac Enzymes Recent Labs  Lab 12/22/18 1206  TROPONINI 0.03*   ------------------------------------------------------------------------------------------------------------------ Invalid input(s): POCBNP  ---------------------------------------------------------------------------------------------------------------  Urinalysis    Component Value Date/Time   COLORURINE YELLOW (A) 01/18/2016 1332   APPEARANCEUR HAZY (A) 01/18/2016 1332   APPEARANCEUR HAZY 06/03/2014 2028   LABSPEC 1.008 01/18/2016 1332   LABSPEC 1.005 06/03/2014 2028   PHURINE 9.0 (H) 01/18/2016 1332   GLUCOSEU >500 (A) 01/18/2016 1332   GLUCOSEU 500 mg/dL 06/03/2014 2028   HGBUR NEGATIVE 01/18/2016 1332   BILIRUBINUR NEGATIVE 01/18/2016 1332   BILIRUBINUR NEGATIVE 06/03/2014 2028   KETONESUR NEGATIVE 01/18/2016 1332   PROTEINUR 100 (A) 01/18/2016 1332   NITRITE NEGATIVE 01/18/2016 1332   LEUKOCYTESUR TRACE (A) 01/18/2016 1332   LEUKOCYTESUR NEGATIVE 06/03/2014 2028     RADIOLOGY: Dg Chest Portable 1 View  Result Date: 12/22/2018 CLINICAL DATA:  Cough after dialysis. EXAM: PORTABLE CHEST 1 VIEW COMPARISON:  Two-view chest x-ray 10/26/2018. FINDINGS: Heart size is exaggerated by low lung volumes. Mild pulmonary vascular congestion is present. There is no airspace disease or effusions. A left subclavian dialysis  catheter is in place. IMPRESSION: Low lung volumes and mild pulmonary vascular congestion without focal airspace disease. Electronically Signed   By: San Morelle M.D.   On: 12/22/2018 13:30    EKG: Orders placed or performed during the hospital encounter of 12/22/18  . ED EKG  . ED EKG  . EKG 12-Lead  . EKG 12-Lead    IMPRESSION AND PLAN:  *Influenza A Tamiflu per pharmacy dosing. Droplet precaution.  *Hypotension Likely due to flu and receiving hemodialysis. She is on multiple antihypertensive medications at home and also on midodrine for the days of hemodialysis. Currently I will hold all antihypertensive medications and will help with readjust the medications once the blood pressure is stable.  *Diabetes mellitus without complication We will continue Lantus as home and  keep on sliding scale coverage.  *Bipolar disorder We will continue Depakote and Lamictal at home dose.  *Depression Continue Lexapro.  *Active smoking Counseled to quit smoking for 4 minutes and offered nicotine patch.  *End-stage renal disease on hemodialysis Patient finished her full dialysis treatment today.  Nephrology consult to continue helping with dialysis while she is here.  *Hypokalemia Along with hemodialysis she is also on Veltassa daily, I will hold the medicine as she is hypokalemic right now.  All the records are reviewed and case discussed with ED provider. Management plans discussed with the patient, family and they are in agreement.  CODE STATUS: Full. Code Status History    Date Active Date Inactive Code Status Order ID Comments User Context   06/19/2017 0024 07/01/2017 2026 Full Code [id number]  Harvie Bridge, DO Inpatient   06/26/2016 1544 06/26/2016 1758 DNR [id number]  Katha Cabal, MD Inpatient   06/25/2016 0751 06/25/2016 0759 Full Code [id number]  Lavetta Nielsen Aaron Mose, MD ED   04/22/2015 1822 04/24/2015 0002 Full Code [id number]  Schnier, Dolores Lory, MD Inpatient      I  tried calling her legal guardian but could not get in touch with him. TOTAL TIME TAKING CARE OF THIS PATIENT: 45 minutes.    Vaughan Basta M.D on 12/22/2018   Between 7am to 6pm - Pager - [phone]  After 6pm go to [email or web address] - password [email or web address] [facility]  Office  [phone]  CC: Primary care physician; Lorelee Market, MD   Note: This dictation was prepared with Dragon dictation along with smaller phrase technology. Any transcriptional errors that result from this process are unintentional.

## 2018-12-22 NOTE — ED Notes (Signed)
Called Joya San at [facility] and informed him of pt's status and admission. Also gave room number. Unable to get through to [name] legal guardian number stating it is out of service.

## 2018-12-22 NOTE — ED Notes (Signed)
X-ray at bedside

## 2018-12-22 NOTE — ED Provider Notes (Signed)
[facility] Emergency Department Provider Note ____________________________________________   First MD Initiated Contact with Patient 12/22/18 1208     (approximate)  I have reviewed the triage vital signs and the nursing notes.   HISTORY  Chief Complaint Cough and Weakness  5 caveat: History of present illness limited due to cognitive impairment  HPI Kelly [name] is a 38 y.o. female with PMH as noted below including ESRD on dialysis, with most recent dialysis this morning, who presents with cough, generalized weakness, and low blood pressure.  The caregiver reports that the patient's cough started yesterday.  The patient is unable to voice any specific complaints.  Per the caregiver, she became unresponsive earlier after the dialysis.  Past Medical History:  Diagnosis Date  . Alcohol abuse    chronic  . Bipolar disorder ([place])   . Chronic kidney disease   . Cognitive changes   . Cognitive impairment   . Depression   . Depression   . Diabetes mellitus without complication ([place])   . Elevated lipids   . Hyperparathyroidism ([place])   . Hypertension   . Staph aureus infection    A/V fistula  . UTI (lower urinary tract infection)     Patient Active Problem List   Diagnosis Date Noted  . Influenza A 12/22/2018  . Hypotension 12/22/2018  . Complication from renal dialysis device 11/06/2018  . Multiple fractures of both lower extremities 06/18/2017  . 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 Hyperkalemia 06/25/2016  . Adjustment disorder with mixed disturbance of emotions and conduct   . Diabetes ([place]) 04/23/2015  . Essential hypertension 04/23/2015  . ESRD on dialysis ([place]) 04/23/2015  . Pain of left arm 04/22/2015    Past Surgical History:  Procedure Laterality Date  . A/V SHUNTOGRAM Left 10/07/2018   Procedure: A/V SHUNTOGRAM;  Surgeon: Katha Cabal, MD;  Location: [place] [facility];  Service: Cardiovascular;  Laterality: Left;    . AV FISTULA PLACEMENT    . CHOLECYSTECTOMY    . dialysis perma cath Right    chest  . PERIPHERAL VASCULAR CATHETERIZATION N/A 05/24/2015   Procedure: Dialysis/Perma Catheter Removal;  Surgeon: Katha Cabal, MD;  Location: [place] [facility] LAB;  Service: Cardiovascular;  Laterality: N/A;  . PERIPHERAL VASCULAR CATHETERIZATION Left 02/28/2016   Procedure: A/V Shuntogram/Fistulagram;  Surgeon: Katha Cabal, MD;  Location: [place] [facility];  Service: Cardiovascular;  Laterality: Left;  . PERIPHERAL VASCULAR CATHETERIZATION N/A 02/28/2016   Procedure: A/V Shunt Intervention;  Surgeon: Katha Cabal, MD;  Location: [place] [facility];  Service: Cardiovascular;  Laterality: N/A;  . PERIPHERAL VASCULAR CATHETERIZATION N/A 06/26/2016   Procedure: A/V Shuntogram/Fistulagram;  Surgeon: Katha Cabal, MD;  Location: [place] [facility];  Service: Cardiovascular;  Laterality: N/A;  . PERIPHERAL VASCULAR CATHETERIZATION N/A 09/10/2016   Procedure: A/V Shuntogram/Fistulagram;  Surgeon: Algernon Huxley, MD;  Location: [place] [facility];  Service: Cardiovascular;  Laterality: N/A;  . PERIPHERAL VASCULAR CATHETERIZATION Left 09/14/2016   Procedure: Thrombectomy;  Surgeon: Katha Cabal, MD;  Location: [place] [facility];  Service: Cardiovascular;  Laterality: Left;  Marland Kitchen VASCULAR ACCESS DEVICE INSERTION Left 04/22/2015   Procedure: INSERTION OF HERO VASCULAR ACCESS DEVICE;  Surgeon: Katha Cabal, MD;  Location: [facility] ORS;  Service: Vascular;  Laterality: Left;    Prior to Admission medications   Medication Sig Start Date End Date Taking? Authorizing Provider  acetaminophen (TYLENOL) 325 MG tablet Take 2 tablets (650 mg total)  by mouth every 6 (six) hours as needed for mild pain, moderate pain or headache. Patient taking differently: Take 650 mg by mouth See admin instructions. Take 650 mg every Monday, Wednesday, and Friday before dialysis, may take 650 mg every 6 hours as needed for  pain or headaches 10/01/16  Yes Clapacs, Madie Reno, MD  albuterol (PROVENTIL HFA;VENTOLIN HFA) 108 (90 Base) MCG/ACT inhaler Inhale 2 puffs into the lungs every 4 (four) hours as needed for wheezing or shortness of breath.   Yes [provider]  amLODipine (NORVASC) 5 MG tablet Take 5 mg by mouth daily.   Yes [provider]  aspirin 81 MG chewable tablet Chew 1 tablet (81 mg total) by mouth daily. 10/02/16  Yes Clapacs, Madie Reno, MD  B Complex-C-Folic Acid (RENA-VITE RX) 1 MG TABS Take 1 tablet by mouth daily. 10/01/16  Yes Clapacs, Madie Reno, MD  calcium acetate (PHOSLO) 667 MG capsule Take 2 capsules (1,334 mg total) by mouth 2 (two) times daily with a meal. 10/01/16  Yes Clapacs, Madie Reno, MD  cetaphil (CETAPHIL) lotion Apply 1 application topically daily.   Yes [provider]  cinacalcet (SENSIPAR) 60 MG tablet Take 1 tablet (60 mg total) by mouth daily. 10/01/16  Yes Clapacs, Madie Reno, MD  cloNIDine (CATAPRES) 0.1 MG tablet Take 1 tablet (0.1 mg total) by mouth 2 (two) times daily. 10/01/16  Yes Clapacs, Madie Reno, MD  divalproex (DEPAKOTE) 500 MG DR tablet Take 2 tablets (1,000 mg total) by mouth at bedtime. Patient taking differently: Take 500-1,000 mg by mouth See admin instructions. Take 500 mg in the morning and 1000 mg at bedtime 10/01/16  Yes Clapacs, Madie Reno, MD  docusate sodium (COLACE) 100 MG capsule Take 1 capsule (100 mg total) by mouth at bedtime. 10/01/16  Yes Clapacs, Madie Reno, MD  escitalopram (LEXAPRO) 10 MG tablet Take 1.5 tablets (15 mg total) by mouth daily. Patient taking differently: Take 10 mg by mouth daily. Take with 5 mg tablet to equal 15 mg daily 10/02/16  Yes Clapacs, Madie Reno, MD  fluticasone (FLOVENT HFA) 110 MCG/ACT inhaler Inhale 2 puffs into the lungs 2 (two) times daily.   Yes [provider]  folic acid (FOLVITE) 1 MG tablet Take 2 tablets (2 mg total) by mouth at bedtime. 10/01/16  Yes Clapacs, Madie Reno, MD  glucagon (GLUCAGON EMERGENCY) 1 MG injection  Inject 1 mg into the vein once as needed (low blood sugar).   Yes [provider]  hydrALAZINE (APRESOLINE) 25 MG tablet Take 25 mg by mouth 3 (three) times daily.   Yes [provider]  insulin aspart (NOVOLOG) 100 UNIT/ML injection Inject 0-15 Units into the skin 3 (three) times daily with meals. Per sliding scale    Yes [provider]  insulin glargine (LANTUS) 100 UNIT/ML injection Inject 20 Units into the skin at bedtime.   Yes [provider]  irbesartan (AVAPRO) 300 MG tablet Take 300 mg by mouth at bedtime.   Yes [provider]  lamoTRIgine (LAMICTAL) 150 MG tablet Take 150 mg by mouth 2 (two) times daily.    Yes [provider]  lidocaine-prilocaine (EMLA) cream Apply 1 application topically 3 (three) times a week. Apply to access site 30 minutes before dialysis. Patient taking differently: Apply 1 application topically every Monday, Wednesday, and Friday. Apply to access site 30 minutes before dialysis.  10/01/16  Yes Clapacs, Madie Reno, MD  medroxyPROGESTERone (DEPO-PROVERA) 150 MG/ML injection Inject 150 mg into the  muscle every 3 (three) months.   Yes [provider]  metoCLOPramide (REGLAN) 10 MG tablet Take 10 mg by mouth 2 (two) times daily.    Yes [provider]  metoprolol (LOPRESSOR) 50 MG tablet Take 1 tablet (50 mg total) by mouth 2 (two) times daily. 10/01/16  Yes Clapacs, Madie Reno, MD  midodrine (PROAMATINE) 10 MG tablet Take 10 mg by mouth every Monday, Wednesday, and Friday. Prior to dialysis   Yes [provider]  Multiple Vitamin (THEREMS) TABS Take 1 tablet by mouth daily. 10/01/16  Yes Clapacs, Madie Reno, MD  ondansetron (ZOFRAN) 4 MG tablet Take 4 mg by mouth 3 (three) times daily as needed for nausea or vomiting.   Yes [provider]  oxyCODONE (OXY IR/ROXICODONE) 5 MG immediate release tablet Take 5 mg by mouth every 6 (six) hours as needed for severe pain.   Yes [provider]    paliperidone (INVEGA SUSTENNA) 156 MG/ML SUSY injection Inject 156 mg into the muscle See admin instructions. EVERY 28 DAYS   Yes [provider]  pantoprazole (PROTONIX) 40 MG tablet Take 40 mg by mouth daily.   Yes [provider]  patiromer (VELTASSA) 8.4 g packet Take 1 packet (8.4 g total) by mouth daily. Patient taking differently: Take 8.4 g by mouth daily. Mixed into 1/3 cup of drink 10/01/16  Yes Clapacs, Madie Reno, MD  QUEtiapine (SEROQUEL) 300 MG tablet Take 300 mg by mouth at bedtime.   Yes [provider]  risperiDONE (RISPERDAL) 0.5 MG tablet Take 0.5 mg by mouth 2 (two) times daily.    Yes [provider]  Tiotropium Bromide-Olodaterol (STIOLTO RESPIMAT IN) Inhale 2 puffs into the lungs daily.    Yes [provider]  traZODone (DESYREL) 100 MG tablet Take 1 tablet (100 mg total) by mouth at bedtime. 10/01/16  Yes Clapacs, Madie Reno, MD  vitamin B-12 (CYANOCOBALAMIN) 1000 MCG tablet Take 1 tablet (1,000 mcg total) by mouth at bedtime. Patient not taking: Reported on 12/22/2018 10/01/16   Clapacs, Madie Reno, MD    Allergies Patient has no known allergies.  Family History  Family history unknown: Yes    Social History Social History   Tobacco Use  . Smoking status: Current Every Day Smoker    Packs/day: 0.25    Years: 0.00    Pack years: 0.00    Types: Cigarettes  . Smokeless tobacco: Current User  Substance Use Topics  . Alcohol use: No  . Drug use: No    Review of Systems Level 5 caveat: Unable to obtain review of systems due to cognitive impairment    ____________________________________________   PHYSICAL EXAM:  VITAL SIGNS: ED Triage Vitals  Enc Vitals Group     BP 12/22/18 1140 (!) 88/43     Pulse Rate 12/22/18 1140 95     Resp 12/22/18 1140 20     Temp 12/22/18 1140 98.1 F (36.7 C)     Temp Source 12/22/18 1140 Oral     SpO2 12/22/18 1140 95 %     Weight 12/22/18 1142 204 lb 5.9 oz (92.7 kg)     Height --       Head Circumference --      Peak Flow --      Pain Score --      Pain Loc --      Pain Edu? --      Excl. in [place]? --     Constitutional: Weak and tired  appearing but arousable. Eyes: Conjunctivae are normal.  EOMI.  PERRLA. Head: Atraumatic. Nose: No congestion/rhinnorhea.  Face appears puffy/swollen. Mouth/Throat: Mucous membranes are moist.  Oropharynx clear with no swelling or stridor. Neck: Normal range of motion.  Cardiovascular: Normal rate, regular rhythm. Grossly normal heart sounds.  Good peripheral circulation. Respiratory: Normal respiratory effort.  No retractions.  Coarse breath sounds bilaterally with no obvious rales or wheeze. Gastrointestinal: Soft and nontender. No distention.  Genitourinary: No flank tenderness. Musculoskeletal:  Extremities warm and well perfused.  Neurologic: Motor intact in all extremities. Skin:  Skin is warm and dry. No rash noted. Psychiatric: Unable to assess. ____________________________________________   LABS (all labs ordered are listed, but only abnormal results are displayed)  Labs Reviewed  BASIC METABOLIC PANEL - Abnormal; Notable for the following components:      Result Value   Potassium 3.0 (*)    Chloride 97 (*)    Glucose, Bld 106 (*)    BUN 23 (*)    Creatinine, Ser 6.14 (*)    Calcium 8.4 (*)    GFR calc non Af Amer 8 (*)    GFR calc Af Amer 9 (*)    All other components within normal limits  CBC WITH DIFFERENTIAL/PLATELET - Abnormal; Notable for the following components:   RBC 2.37 (*)    Hemoglobin 8.1 (*)    HCT 25.9 (*)    MCV 109.3 (*)    MCH 34.2 (*)    RDW 16.1 (*)    Platelets 107 (*)    All other components within normal limits  TROPONIN I - Abnormal; Notable for the following components:   Troponin I 0.03 (*)    All other components within normal limits  INFLUENZA PANEL BY PCR (TYPE A & B) - Abnormal; Notable for the following components:   Influenza A By PCR POSITIVE (*)    All other components  within normal limits  LACTIC ACID, PLASMA  LACTIC ACID, PLASMA   ____________________________________________  EKG  ED ECG REPORT I, Arta Silence, the attending physician, personally viewed and interpreted this ECG.  Date: 12/22/2018 EKG Time: 1409 Rate: 87 Rhythm: normal sinus rhythm QRS Axis: normal Intervals: Borderline prolonged QT ST/T Wave abnormalities: normal Narrative Interpretation: no evidence of acute ischemia  ____________________________________________  RADIOLOGY  CXR: Vascular congestion with no focal infiltrate  ____________________________________________   PROCEDURES  Procedure(s) performed: No  Procedures  Critical Care performed: Yes  CRITICAL CARE Performed by: Arta Silence   Total critical care time: 20 minutes  Critical care time was exclusive of separately billable procedures and treating other patients.  Critical care was necessary to treat or prevent imminent or life-threatening deterioration.  Critical care was time spent personally by me on the following activities: development of treatment plan with patient and/or surrogate as well as nursing, discussions with consultants, evaluation of patient's response to treatment, examination of patient, obtaining history from patient or surrogate, ordering and performing treatments and interventions, ordering and review of laboratory studies, ordering and review of radiographic studies, pulse oximetry and re-evaluation of patient's condition. ____________________________________________   INITIAL IMPRESSION / ASSESSMENT AND PLAN / ED COURSE  Pertinent labs & imaging results that were available during my care of the patient were reviewed by me and considered in my medical decision making (see chart for details).  38 year old female with PMH as noted above including ESRD on dialysis presents after her dialysis with generalized weakness, hypotension, and off over the last day.  The  patient has  cognitive impairment and per the caregiver is less responsive than usual.  She is not answering any questions at this time.  She has no respiratory distress and lungs are mostly clear on exam.  She is hypertensive but the other vital signs are normal.  Differential includes hypovolemia after dialysis, versus possible infection.  We will obtain chest x-ray, lab work-up, influenza swab, and reassess.  ----------------------------------------- 4:04 PM on 12/22/2018 -----------------------------------------  Work-up revealed that the patient was positive for influenza A.  Given her hypotension and comorbidities, I will admitted her to the hospital for further monitoring and treatment.  Blood pressure is improving with fluids.  I signed her out to the hospitalist Dr. Anselm Jungling. ____________________________________________   FINAL CLINICAL IMPRESSION(S) / ED DIAGNOSES  Final diagnoses:  Influenza A  Hypotension, unspecified hypotension type      NEW MEDICATIONS STARTED DURING THIS VISIT:  New Prescriptions   No medications on file     Note:  This document was prepared using Dragon voice recognition software and may include unintentional dictation errors.   Arta Silence, MD 12/22/18 [phone]

## 2018-12-22 NOTE — ED Notes (Addendum)
Pt incontinent of stool. Assisted pt to bathroom, given wipes, blue scrub pants and panties to clean self. Bed cleaned and changed.

## 2018-12-22 NOTE — Consult Note (Signed)
Pharmacy Antibiotic Note  Jonathan Kirkendoll is a 38 y.o. female admitted on 12/22/2018 with flu A positive.  Pharmacy has been consulted for tamiflu dosing.  Plan: Patient received 1 30mg  dose after HD today Monday (12/22/18)   Will dose 30mg  after each HD treatment between today and 12/26/18 (total 5 days of therapy)  Weight: 204 lb 5.9 oz (92.7 kg)  Temp (24hrs), Avg:98.1 F (36.7 C), Min:98.1 F (36.7 C), Max:98.1 F (36.7 C)  Recent Labs  Lab 12/22/18 1206 12/22/18 1400  WBC 6.5  --   CREATININE 6.14*  --   LATICACIDVEN  --  0.8    Estimated Creatinine Clearance: 13.4 mL/min (A) (by C-G formula based on SCr of 6.14 mg/dL (H)).    No Known Allergies  Antimicrobials this admission: Tamiflu 1/27 >>    Dose adjustments this admission: None  Microbiology results: 12/22/18 Flu A Positive  Thank you for allowing pharmacy to be a part of this patient's care.  Lu Duffel, PharmD, BCPS Clinical Pharmacist 12/22/2018 3:04 PM

## 2018-12-22 NOTE — ED Notes (Signed)
Pt arrives after dialysis treatment. Pt was not able to receive full dialysis treatment. Pt's BP became too low to finish treatment. Pt advised to go to hospital. Family at bedside and also states that pt became unresponsive and not answering questions appropriately. Pt alert to person and place with this RN.  Pt's face is very puffy and swollen. Pt also has dry cough present. Pt resting comfortably at this time.

## 2018-12-22 NOTE — ED Notes (Signed)
Full rainbow and 2 sets of blood cultures sent to lab

## 2018-12-22 NOTE — ED Triage Notes (Signed)
From [facility] family care, per caregiver patient has had cough and increased weakness x 2 days.patient awake but no answering questions in triage, looks drowsy.

## 2018-12-22 NOTE — ED Notes (Signed)
Date and time results received: 12/22/18 1328 (use smartphrase ".now" to insert current time)  Test: troponin Critical Value: 0.03  Name of Provider Notified: MD Siadecki  Orders Received? Or Actions Taken?: Orders Received - See Orders for details

## 2018-12-23 LAB — CBC
HCT: 27.3 % — ABNORMAL LOW (ref 36.0–46.0)
Hemoglobin: 8.3 g/dL — ABNORMAL LOW (ref 12.0–15.0)
MCH: 33.5 pg (ref 26.0–34.0)
MCHC: 30.4 g/dL (ref 30.0–36.0)
MCV: 110.1 fL — ABNORMAL HIGH (ref 80.0–100.0)
Platelets: 103 10*3/uL — ABNORMAL LOW (ref 150–400)
RBC: 2.48 MIL/uL — ABNORMAL LOW (ref 3.87–5.11)
RDW: 15.8 % — AB (ref 11.5–15.5)
WBC: 4.7 10*3/uL (ref 4.0–10.5)
nRBC: 0 % (ref 0.0–0.2)

## 2018-12-23 LAB — BASIC METABOLIC PANEL
Anion gap: 11 (ref 5–15)
BUN: 35 mg/dL — AB (ref 6–20)
CO2: 28 mmol/L (ref 22–32)
Calcium: 9.2 mg/dL (ref 8.9–10.3)
Chloride: 100 mmol/L (ref 98–111)
Creatinine, Ser: 8.72 mg/dL — ABNORMAL HIGH (ref 0.44–1.00)
GFR calc Af Amer: 6 mL/min — ABNORMAL LOW (ref >60)
GFR, EST NON AFRICAN AMERICAN: 5 mL/min — AB (ref >60)
Glucose, Bld: 116 mg/dL — ABNORMAL HIGH (ref 70–99)
Potassium: 3.5 mmol/L (ref 3.5–5.1)
Sodium: 139 mmol/L (ref 135–145)

## 2018-12-23 LAB — MRSA PCR SCREENING: MRSA by PCR: NEGATIVE

## 2018-12-23 LAB — GLUCOSE, CAPILLARY
Glucose-Capillary: 105 mg/dL — ABNORMAL HIGH (ref 70–99)
Glucose-Capillary: 111 mg/dL — ABNORMAL HIGH (ref 70–99)
Glucose-Capillary: 142 mg/dL — ABNORMAL HIGH (ref 70–99)
Glucose-Capillary: 152 mg/dL — ABNORMAL HIGH (ref 70–99)

## 2018-12-23 MED ORDER — LORAZEPAM 2 MG/ML IJ SOLN
1.0000 mg | Freq: Four times a day (QID) | INTRAMUSCULAR | Status: DC | PRN
  Administered 2018-12-23 – 2018-12-25 (×3): 1 mg via INTRAVENOUS
  Filled 2018-12-23 (×4): qty 1

## 2018-12-23 MED ORDER — CHLORHEXIDINE GLUCONATE CLOTH 2 % EX PADS
6.0000 | MEDICATED_PAD | Freq: Every day | CUTANEOUS | Status: DC
  Administered 2018-12-24 – 2018-12-26 (×3): 6 via TOPICAL

## 2018-12-23 NOTE — Progress Notes (Signed)
[place], [place] 12/23/18  Subjective:   Patient known to our practice from outpatient dialysis.  Admitted for cough and weakness for the past 2 days.  Sleeping soundly.  In the ER, tested positive for influenza A  Objective:  Vital signs in last 24 hours:  Temp:  [97.9 F (36.6 C)-99.4 F (37.4 C)] 99.4 F (37.4 C) (01/28 0416) Pulse Rate:  [94-113] 95 (01/28 0551) Resp:  [18-20] 20 (01/28 0416) BP: (100-118)/(43-62) 111/48 (01/28 0551) SpO2:  [91 %-95 %] 94 % (01/28 0829)  Weight change:  Filed Weights   12/22/18 1142  Weight: 92.7 kg    Intake/Output:    Intake/Output Summary (Last 24 hours) at 12/23/2018 1444 Last data filed at 12/22/2018 1457 Gross per 24 hour  Intake 500 ml  Output -  Net 500 ml     Physical Exam: General:  No acute distress  HEENT  moist oral mucous membranes  Neck  supple  Pulm/lungs  room air, normal breathing effort, clear to auscultation  CVS/Heart  regular, no rub  Abdomen:   Soft, nontender  Extremities:  No edema  Neurologic:  Lethargic, did not wake up  Skin:  No acute rashes  Access:  Left upper arm AV fistula       Basic Metabolic Panel:  Recent Labs  Lab 12/22/18 1206 12/23/18 0814  NA 137 139  K 3.0* 3.5  CL 97* 100  CO2 30 28  GLUCOSE 106* 116*  BUN 23* 35*  CREATININE 6.14* 8.72*  CALCIUM 8.4* 9.2     CBC: Recent Labs  Lab 12/22/18 1206 12/23/18 0814  WBC 6.5 4.7  NEUTROABS 4.9  --   HGB 8.1* 8.3*  HCT 25.9* 27.3*  MCV 109.3* 110.1*  PLT 107* 103*      Lab Results  Component Value Date   HEPBSAG Negative 06/19/2017   HEPBSAB Reactive 08/29/2016      Microbiology:  Recent Results (from the past 240 hour(s))  MRSA PCR Screening     Status: None   Collection Time: 12/23/18  4:22 AM  Result Value Ref Range Status   MRSA by PCR NEGATIVE NEGATIVE Final    Comment:        The GeneXpert MRSA Assay (FDA approved for NASAL specimens only), is one component of  a comprehensive MRSA colonization surveillance program. It is not intended to diagnose MRSA infection nor to guide or monitor treatment for MRSA infections. Performed at [facility], [address] [place], [place] [address]     Coagulation Studies: No results for input(s): LABPROT, INR in the last 72 hours.  Urinalysis: No results for input(s): COLORURINE, LABSPEC, PHURINE, GLUCOSEU, HGBUR, BILIRUBINUR, KETONESUR, PROTEINUR, UROBILINOGEN, NITRITE, LEUKOCYTESUR in the last 72 hours.  Invalid input(s): APPERANCEUR    Imaging: Dg Chest Portable 1 View  Result Date: 12/22/2018 CLINICAL DATA:  Cough after dialysis. EXAM: PORTABLE CHEST 1 VIEW COMPARISON:  Two-view chest x-ray 10/26/2018. FINDINGS: Heart size is exaggerated by low lung volumes. Mild pulmonary vascular congestion is present. There is no airspace disease or effusions. A left subclavian dialysis catheter is in place. IMPRESSION: Low lung volumes and mild pulmonary vascular congestion without focal airspace disease. Electronically Signed   By: San Morelle M.D.   On: 12/22/2018 13:30     Medications:    . arformoterol  15 mcg Nebulization BID   And  . umeclidinium bromide  1 puff Inhalation Daily  . aspirin  81 mg Oral Daily  .  budesonide (PULMICORT) nebulizer solution  0.25 mg Nebulization BID  . calcium acetate  1,334 mg Oral BID WC  . cinacalcet  60 mg Oral Q breakfast  . divalproex  500 mg Oral q morning - 10a   And  . divalproex  1,000 mg Oral QHS  . docusate sodium  100 mg Oral QHS  . escitalopram  10 mg Oral Daily  . folic acid  2 mg Oral QHS  . heparin  5,000 Units Subcutaneous Q8H  . insulin aspart  0-5 Units Subcutaneous QHS  . insulin aspart  0-9 Units Subcutaneous TID WC  . insulin glargine  20 Units Subcutaneous QHS  . lamoTRIgine  150 mg Oral BID  . [START ON 12/24/2018] midodrine  10 mg Oral Q M,W,F  . multivitamin  1 tablet Oral Daily  . multivitamin with minerals  1 tablet  Oral Daily  . oseltamivir  30 mg Oral Q M,W,F-1800  . pantoprazole  40 mg Oral Daily  . QUEtiapine  300 mg Oral QHS  . risperiDONE  0.5 mg Oral BID  . traZODone  100 mg Oral QHS   acetaminophen, albuterol, docusate sodium, glucagon (human recombinant), LORazepam, ondansetron, oxyCODONE  Assessment/ Plan:  38 y.o. female with developmental delay, schizophrenia, ESRD, hypertension, diabetes type 2, history of bilateral metatarsal fractures and right fibula fracture in July 2018 status post surgery  [facility]/Monday Wednesday Friday/[facility]  1.  End-stage renal disease Patient completed her hemodialysis yesterday Continue her normal dialysis schedule.  Next hemodialysis planned for tomorrow  2.  Anemia of chronic kidney disease Hemoglobin 8.3.  We will continue Epogen with hemodialysis  3.  Influenza A+ Tamiflu as per hospitalist team  4.  Secondary hyperparathyroidism Continue home regimen of calcium acetate and cinacalcet    LOS: 1 Gabriel Conry 1/28/20202:44 PM  [place], [place]  Note: This note was prepared with Dragon dictation. Any transcription errors are unintentional

## 2018-12-23 NOTE — Progress Notes (Signed)
[place] at [facility]                                                                                                                                                                                  Patient Demographics   Kelly [name], is a 38 y.o. female, DOB - 1980/12/25, OAC:[id number]  Admit date - 12/22/2018   Admitting Physician Vaughan Basta, MD  Outpatient Primary MD for the patient is Lorelee Market, MD   LOS - 1  Subjective:  Admitted with the flu states that she is not feeling well.   Review of Systems:   CONSTITUTIONAL: Limited due to patient poor historian  Vitals:   Vitals:   12/23/18 0248 12/23/18 0416 12/23/18 0551 12/23/18 0829  BP:  110/62 (!) 111/48   Pulse:  (!) 113 95   Resp:  20    Temp: 98.4 F (36.9 C) 99.4 F (37.4 C)    TempSrc:  Oral    SpO2:  94%  94%  Weight:      Height:        Wt Readings from Last 3 Encounters:  12/22/18 92.7 kg  11/06/18 92.6 kg  10/26/18 90.2 kg     Intake/Output Summary (Last 24 hours) at 12/23/2018 1409 Last data filed at 12/22/2018 1457 Gross per 24 hour  Intake 500 ml  Output -  Net 500 ml    Physical Exam:   GENERAL: Pleasant-appearing in no apparent distress.  HEAD, EYES, EARS, NOSE AND THROAT: Atraumatic, normocephalic. Extraocular muscles are intact. Pupils equal and reactive to light. Sclerae anicteric. No conjunctival injection. No oro-pharyngeal erythema.  NECK: Supple. There is no jugular venous distention. No bruits, no lymphadenopathy, no thyromegaly.  HEART: Regular rate and rhythm,. No murmurs, no rubs, no clicks.  LUNGS: Clear to auscultation bilaterally. No rales or rhonchi. No wheezes.  ABDOMEN: Soft, flat, nontender, nondistended. Has good bowel sounds. No hepatosplenomegaly appreciated.  EXTREMITIES: No evidence of any cyanosis, clubbing, or peripheral edema.  +2 pedal and radial pulses bilaterally.  NEUROLOGIC: The patient is alert, awake, and  oriented x3 with no focal motor or sensory deficits appreciated bilaterally.  SKIN: Moist and warm with no rashes appreciated.  Psych: Not anxious, depressed LN: No inguinal LN enlargement    Antibiotics   Anti-infectives (From admission, onward)   Start     Dose/Rate Route Frequency Ordered Stop   12/23/18 0000  oseltamivir (TAMIFLU) capsule 30 mg     30 mg Oral Every M-W-F (1800) 12/22/18 1506 12/29/18 1759   12/22/18 1400  oseltamivir (TAMIFLU) capsule 75 mg  Status:  Discontinued     75 mg Oral  Once  12/22/18 1353 12/22/18 1356   12/22/18 1400  oseltamivir (TAMIFLU) capsule 30 mg     30 mg Oral  Once 12/22/18 1356 12/22/18 1414      Medications   Scheduled Meds: . arformoterol  15 mcg Nebulization BID   And  . umeclidinium bromide  1 puff Inhalation Daily  . aspirin  81 mg Oral Daily  . budesonide (PULMICORT) nebulizer solution  0.25 mg Nebulization BID  . calcium acetate  1,334 mg Oral BID WC  . cinacalcet  60 mg Oral Q breakfast  . divalproex  500 mg Oral q morning - 10a   And  . divalproex  1,000 mg Oral QHS  . docusate sodium  100 mg Oral QHS  . escitalopram  10 mg Oral Daily  . folic acid  2 mg Oral QHS  . heparin  5,000 Units Subcutaneous Q8H  . insulin aspart  0-5 Units Subcutaneous QHS  . insulin aspart  0-9 Units Subcutaneous TID WC  . insulin glargine  20 Units Subcutaneous QHS  . lamoTRIgine  150 mg Oral BID  . [START ON 12/24/2018] midodrine  10 mg Oral Q M,W,F  . multivitamin  1 tablet Oral Daily  . multivitamin with minerals  1 tablet Oral Daily  . oseltamivir  30 mg Oral Q M,W,F-1800  . pantoprazole  40 mg Oral Daily  . QUEtiapine  300 mg Oral QHS  . risperiDONE  0.5 mg Oral BID  . traZODone  100 mg Oral QHS   Continuous Infusions: PRN Meds:.acetaminophen, albuterol, docusate sodium, glucagon (human recombinant), ondansetron, oxyCODONE   Data Review:   Micro Results Recent Results (from the past 240 hour(s))  MRSA PCR Screening     Status:  None   Collection Time: 12/23/18  4:22 AM  Result Value Ref Range Status   MRSA by PCR NEGATIVE NEGATIVE Final    Comment:        The GeneXpert MRSA Assay (FDA approved for NASAL specimens only), is one component of a comprehensive MRSA colonization surveillance program. It is not intended to diagnose MRSA infection nor to guide or monitor treatment for MRSA infections. Performed at [facility], [address] [place], [place] [address]     Radiology Reports Dg Chest Portable 1 View  Result Date: 12/22/2018 CLINICAL DATA:  Cough after dialysis. EXAM: PORTABLE CHEST 1 VIEW COMPARISON:  Two-view chest x-ray 10/26/2018. FINDINGS: Heart size is exaggerated by low lung volumes. Mild pulmonary vascular congestion is present. There is no airspace disease or effusions. A left subclavian dialysis catheter is in place. IMPRESSION: Low lung volumes and mild pulmonary vascular congestion without focal airspace disease. Electronically Signed   By: San Morelle M.D.   On: 12/22/2018 13:30     CBC Recent Labs  Lab 12/22/18 1206 12/23/18 0814  WBC 6.5 4.7  HGB 8.1* 8.3*  HCT 25.9* 27.3*  PLT 107* 103*  MCV 109.3* 110.1*  MCH 34.2* 33.5  MCHC 31.3 30.4  RDW 16.1* 15.8*  LYMPHSABS 0.9  --   MONOABS 0.4  --   EOSABS 0.2  --   BASOSABS 0.0  --     Chemistries  Recent Labs  Lab 12/22/18 1206 12/23/18 0814  NA 137 139  K 3.0* 3.5  CL 97* 100  CO2 30 28  GLUCOSE 106* 116*  BUN 23* 35*  CREATININE 6.14* 8.72*  CALCIUM 8.4* 9.2   ------------------------------------------------------------------------------------------------------------------ estimated creatinine clearance is 9.4 mL/min (A) (by C-G formula based on SCr of 8.72  mg/dL (H)). ------------------------------------------------------------------------------------------------------------------ No results for input(s): HGBA1C in the last 72  hours. ------------------------------------------------------------------------------------------------------------------ No results for input(s): CHOL, HDL, LDLCALC, TRIG, CHOLHDL, LDLDIRECT in the last 72 hours. ------------------------------------------------------------------------------------------------------------------ No results for input(s): TSH, T4TOTAL, T3FREE, THYROIDAB in the last 72 hours.  Invalid input(s): FREET3 ------------------------------------------------------------------------------------------------------------------ No results for input(s): VITAMINB12, FOLATE, FERRITIN, TIBC, IRON, RETICCTPCT in the last 72 hours.  Coagulation profile No results for input(s): INR, PROTIME in the last 168 hours.  No results for input(s): DDIMER in the last 72 hours.  Cardiac Enzymes Recent Labs  Lab 12/22/18 1206  TROPONINI 0.03*   ------------------------------------------------------------------------------------------------------------------ Invalid input(s): POCBNP    Assessment & Plan   *Influenza A Tamiflu per pharmacy dosing. Droplet precaution.  *Hypotension Likely due to flu and receiving hemodialysis. Blood pressure stable  *Diabetes mellitus without complication We will continue Lantus as home and keep on sliding scale coverage. Glucose stable  *Bipolar disorder We will continue Depakote and Lamictal at home dose.  *Depression Continue Lexapro.  *Active smoking Counseled to quit smoking for 4 minutes and offered nicotine patch.  *End-stage renal disease on hemodialysis Neurology aware of patient's admission  *Hypokalemia Potassium stable      Code Status Orders  (From admission, onward)         Start     Ordered   12/22/18 1608  Full code  Continuous     12/22/18 1607        Code Status History    Date Active Date Inactive Code Status Order ID Comments User Context   06/19/2017 0024 07/01/2017 2026 Full Code [id number]   Harvie Bridge, DO Inpatient   06/26/2016 1544 06/26/2016 1758 DNR [id number]  Katha Cabal, MD Inpatient   06/25/2016 0751 06/25/2016 0759 Full Code [id number]  Hower, Aaron Mose, MD ED   04/22/2015 1822 04/24/2015 0002 Full Code [id number]  Schnier, Dolores Lory, MD Inpatient           Consults nephrology  DVT Prophylaxis  heparin  Lab Results  Component Value Date   PLT 103 (L) 12/23/2018     Time Spent in minutes   38min Greater than 50% of time spent in care coordination and counseling patient regarding the condition and plan of care.   Dustin Flock M.D on 12/23/2018 at 2:09 PM  Between 7am to 6pm - Pager - [phone]  After 6pm go to [email or web address] - Proofreader  [employer]   Office  [phone]

## 2018-12-23 NOTE — Progress Notes (Signed)
Pt wandering looking for "Asencion Noble" Pt shows no comprehension that requested person in not at facility. Concerned for fall risk ask pt to sit. Pt refusing. Stood by pt until pt willing to sit.

## 2018-12-23 NOTE — Progress Notes (Signed)
Pt refused to let lab draw blood. Asked if they should come back at 0800 and said that she might be more agreeable then.

## 2018-12-23 NOTE — Progress Notes (Signed)
Pt actively trying to leave unit. Pt exhibits fall risk and is actively refusing to sit down. I have educated, Agricultural consultant has educated. Pt exhibits no comprehension.
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
## 2018-12-23 NOTE — Lactation Note (Signed)
Legal guardian notified of room change. Gave verbal consent to share information with Mr. Kelly [name]. Stating "yeah you can answer their questions, they take good care of her, good care of Kelly [name]."  [name] also notified of room change.

## 2018-12-24 ENCOUNTER — Ambulatory Visit: Payer: Medicaid Other | Admitting: Podiatry

## 2018-12-24 LAB — GLUCOSE, CAPILLARY
Glucose-Capillary: 118 mg/dL — ABNORMAL HIGH (ref 70–99)
Glucose-Capillary: 98 mg/dL (ref 70–99)
Glucose-Capillary: 277 mg/dL — ABNORMAL HIGH (ref 70–99)

## 2018-12-24 MED ORDER — GUAIFENESIN-DM 100-10 MG/5ML PO SYRP
5.0000 mL | ORAL_SOLUTION | ORAL | Status: DC | PRN
  Filled 2018-12-24: qty 5

## 2018-12-24 MED ORDER — GUAIFENESIN ER 600 MG PO TB12
600.0000 mg | ORAL_TABLET | Freq: Two times a day (BID) | ORAL | Status: DC
  Administered 2018-12-24 – 2018-12-25 (×3): 600 mg via ORAL
  Filled 2018-12-24 (×3): qty 1

## 2018-12-24 MED ORDER — GUAIFENESIN-DM 100-10 MG/5ML PO SYRP
15.0000 mL | ORAL_SOLUTION | ORAL | Status: DC | PRN
  Administered 2018-12-24 (×2): 15 mL via ORAL
  Filled 2018-12-24 (×3): qty 15

## 2018-12-24 NOTE — Progress Notes (Signed)
HD Tx complete, pt lost 20 min TX time, venous chamber clotted.  TX was tolerated well, UF goal met.    12/24/18 1540  Vital Signs  Pulse Rate 95  Pulse Rate Source Monitor  Resp 16  BP (!) 82/46  BP Location Right Arm  BP Method Automatic  Patient Position (if appropriate) Lying  Oxygen Therapy  SpO2 100 %  O2 Device Room Air  Pulse Oximetry Type Continuous  During Hemodialysis Assessment  HD Safety Checks Performed Yes  KECN 72.8 KECN  Dialysis Fluid Bolus Normal Saline  Bolus Amount (mL) 250 mL  Intra-Hemodialysis Comments Tolerated well;Tx completed (20 min TX time lost clotted venous chamber )  Post-Hemodialysis Assessment  Rinseback Volume (mL) 250 mL  KECN 72.8 V  Dialyzer Clearance Heavily streaked  Duration of HD Treatment -hour(s) 3.25 hour(s) (20 min tx time lost due to clotting, MD aware )  Hemodialysis Intake (mL) 500 mL  UF Total -Machine (mL) 1578 mL  Net UF (mL) 1078 mL  Tolerated HD Treatment Yes  AVG/AVF Arterial Site Held (minutes) 10 minutes  AVG/AVF Venous Site Held (minutes) 10 minutes  Fistula / Graft Left Upper arm  No Placement Date or Time found.   Orientation: Left  Access Location: Upper arm  Site Condition No complications  Status Deaccessed

## 2018-12-24 NOTE — Progress Notes (Signed)
Called legal guardian to obtain consent for dialysis. called cell and work. No answer and unable to leave VM.

## 2018-12-24 NOTE — Progress Notes (Signed)
[place], [place] 12/24/18  Subjective:   Patient known to our practice from outpatient dialysis.  Admitted for cough and weakness for the past 2 days.  tested positive for influenza A Denies acute c/o Refusing her meds today  Objective:  Vital signs in last 24 hours:  Temp:  [98.2 F (36.8 C)-98.9 F (37.2 C)] 98.8 F (37.1 C) (01/29 0945) Pulse Rate:  [94-110] 103 (01/29 0945) Resp:  [16-24] 16 (01/29 0945) BP: (103-124)/(52-90) 103/52 (01/29 0945) SpO2:  [93 %-97 %] 94 % (01/29 0945)  Weight change:  Filed Weights   12/22/18 1142  Weight: 92.7 kg    Intake/Output:    Intake/Output Summary (Last 24 hours) at 12/24/2018 1126 Last data filed at 12/23/2018 1908 Gross per 24 hour  Intake -  Output 400 ml  Net -400 ml     Physical Exam: General:  No acute distress, generalized weakness  HEENT  moist oral mucous membranes  Neck  supple  Pulm/lungs  room air,  Coarse breath sounds  CVS/Heart  regular, no rub  Abdomen:   Soft, nontender  Extremities:  No edema  Neurologic:  able to follow commands  Skin:  No acute rashes  Access:  Left upper arm AV fistula       Basic Metabolic Panel:  Recent Labs  Lab 12/22/18 1206 12/23/18 0814  NA 137 139  K 3.0* 3.5  CL 97* 100  CO2 30 28  GLUCOSE 106* 116*  BUN 23* 35*  CREATININE 6.14* 8.72*  CALCIUM 8.4* 9.2     CBC: Recent Labs  Lab 12/22/18 1206 12/23/18 0814  WBC 6.5 4.7  NEUTROABS 4.9  --   HGB 8.1* 8.3*  HCT 25.9* 27.3*  MCV 109.3* 110.1*  PLT 107* 103*      Lab Results  Component Value Date   HEPBSAG Negative 06/19/2017   HEPBSAB Reactive 08/29/2016      Microbiology:  Recent Results (from the past 240 hour(s))  MRSA PCR Screening     Status: None   Collection Time: 12/23/18  4:22 AM  Result Value Ref Range Status   MRSA by PCR NEGATIVE NEGATIVE Final    Comment:        The GeneXpert MRSA Assay (FDA approved for NASAL specimens only), is one  component of a comprehensive MRSA colonization surveillance program. It is not intended to diagnose MRSA infection nor to guide or monitor treatment for MRSA infections. Performed at [facility], [address] [place], [place] [address]     Coagulation Studies: No results for input(s): LABPROT, INR in the last 72 hours.  Urinalysis: No results for input(s): COLORURINE, LABSPEC, PHURINE, GLUCOSEU, HGBUR, BILIRUBINUR, KETONESUR, PROTEINUR, UROBILINOGEN, NITRITE, LEUKOCYTESUR in the last 72 hours.  Invalid input(s): APPERANCEUR    Imaging: Dg Chest Portable 1 View  Result Date: 12/22/2018 CLINICAL DATA:  Cough after dialysis. EXAM: PORTABLE CHEST 1 VIEW COMPARISON:  Two-view chest x-ray 10/26/2018. FINDINGS: Heart size is exaggerated by low lung volumes. Mild pulmonary vascular congestion is present. There is no airspace disease or effusions. A left subclavian dialysis catheter is in place. IMPRESSION: Low lung volumes and mild pulmonary vascular congestion without focal airspace disease. Electronically Signed   By: San Morelle M.D.   On: 12/22/2018 13:30     Medications:    . arformoterol  15 mcg Nebulization BID   And  . umeclidinium bromide  1 puff Inhalation Daily  . aspirin  81 mg Oral Daily  .  budesonide (PULMICORT) nebulizer solution  0.25 mg Nebulization BID  . calcium acetate  1,334 mg Oral BID WC  . Chlorhexidine Gluconate Cloth  6 each Topical Q0600  . cinacalcet  60 mg Oral Q breakfast  . divalproex  500 mg Oral q morning - 10a   And  . divalproex  1,000 mg Oral QHS  . docusate sodium  100 mg Oral QHS  . escitalopram  10 mg Oral Daily  . folic acid  2 mg Oral QHS  . guaiFENesin  600 mg Oral BID  . heparin  5,000 Units Subcutaneous Q8H  . insulin aspart  0-5 Units Subcutaneous QHS  . insulin aspart  0-9 Units Subcutaneous TID WC  . insulin glargine  20 Units Subcutaneous QHS  . lamoTRIgine  150 mg Oral BID  . midodrine  10 mg Oral Q  M,W,F  . multivitamin  1 tablet Oral Daily  . multivitamin with minerals  1 tablet Oral Daily  . oseltamivir  30 mg Oral Q M,W,F-1800  . pantoprazole  40 mg Oral Daily  . QUEtiapine  300 mg Oral QHS  . risperiDONE  0.5 mg Oral BID  . traZODone  100 mg Oral QHS   acetaminophen, albuterol, docusate sodium, glucagon (human recombinant), guaiFENesin-dextromethorphan, LORazepam, ondansetron, oxyCODONE  Assessment/ Plan:  38 y.o. female with developmental delay, schizophrenia, ESRD, hypertension, diabetes type 2, history of bilateral metatarsal fractures and right fibula fracture in July 2018 status post surgery  [facility]/Monday Wednesday Friday/[facility]  1.  End-stage renal disease HD today  2.  Anemia of chronic kidney disease Hemoglobin 8.3.  We will continue Epogen with hemodialysis  3.  Influenza A+ Tamiflu as per hospitalist team  4.  Secondary hyperparathyroidism Continue home regimen of calcium acetate and cinacalcet with meals    LOS: [place] 1/29/202011:26 AM  [place], [place]  Note: This note was prepared with Dragon dictation. Any transcription errors are unintentional

## 2018-12-24 NOTE — Progress Notes (Signed)
Pre HD Assessment   12/24/18 1220  Neurological  Level of Consciousness Alert  Orientation Level Appropriate for developmental age;Oriented X4  Respiratory  Bilateral Breath Sounds Diminished  Cough None  Cardiac  Pulse Irregular  Heart Sounds Murmur  ECG Monitor Yes  Vascular  R Radial Pulse +2  L Radial Pulse +2  Edema Generalized  Generalized Edema +1  Psychosocial  Psychosocial (WDL) WDL  Patient Behaviors Irritable;Aggressive verbally  Emotional support given Given to patient

## 2018-12-24 NOTE — Progress Notes (Signed)
Post HD Tx    12/24/18 1545  Hand-Off documentation  Report given to (Full Name) Kelly Commons, RN   Report received from (Full Name) Beatris Ship, RN   Vital Signs  Temp 98.1 F (36.7 C)  Temp Source Oral  Pulse Rate 100  Resp 17  BP (!) 86/66  BP Location Right Arm  BP Method Automatic  Patient Position (if appropriate) Lying  Oxygen Therapy  SpO2 100 %  O2 Device Room Air  Pulse Oximetry Type Continuous  Pain Assessment  Pain Scale 0-10  Pain Score 0  Dialysis Weight  Weight 92 kg  Type of Weight Post-Dialysis  Post-Hemodialysis Assessment  Rinseback Volume (mL) 250 mL  KECN 72.8 V  Dialyzer Clearance Heavily streaked  Duration of HD Treatment -hour(s) 3.25 hour(s)  Hemodialysis Intake (mL) 500 mL  UF Total -Machine (mL) 1578 mL  Net UF (mL) 1078 mL  Tolerated HD Treatment Yes  AVG/AVF Arterial Site Held (minutes) 10 minutes  AVG/AVF Venous Site Held (minutes) 10 minutes  Fistula / Graft Left Upper arm  No Placement Date or Time found.   Orientation: Left  Access Location: Upper arm  Site Condition No complications  Fistula / Graft Assessment Present;Thrill;Bruit  Status Deaccessed  Drainage Description None

## 2018-12-24 NOTE — Clinical Social Work Note (Signed)
Clinical Social Work Assessment  Patient Details  Name: Kelly [name] MRN: [id number] Date of Birth: 1981-08-04  Date of referral:  12/24/18               Reason for consult:  Facility Placement                Permission sought to share information with:  Case Manager, Customer service manager, Family Supports Permission granted to share information::  Yes, Verbal Permission Granted  Name::        Agency::     Relationship::     Contact Information:     Housing/Transportation Living arrangements for the past 2 months:  Group Home Source of Information:  Other (Comment Required)(Chart Review ) Patient Interpreter Needed:  None Criminal Activity/Legal Involvement Pertinent to Current Situation/Hospitalization:  No - Comment as needed Significant Relationships:  None Lives with:  Facility Resident Do you feel safe going back to the place where you live?  Yes Need for family participation in patient care:  No (Coment)  Care giving concerns:  Patient lives in a group home    Social Worker assessment / plan:  CSW consulted for facility placement. CSW found through chart review that patient lives at [facility] and has a legal guardian. CSW attempted to call legal guardian, Kelly [name] [phone] but she is not available. CSW will continue to try and reach guardian. CSW will follow for discharge planning.   Employment status:  Disabled (Comment on whether or not currently receiving Disability) Insurance information:  Medicaid In [place] PT Recommendations:  Not assessed at this time Information / Referral to community resources:     Patient/Family's Response to care:  Patient confused and unable to give information   Patient/Family's Understanding of and Emotional Response to Diagnosis, Current Treatment, and Prognosis:  Guardian is aware patient is hospitalized.   Emotional Assessment Appearance:  Appears stated age Attitude/Demeanor/Rapport:   Guarded Affect (typically observed):  Restless Orientation:  Oriented to Self Alcohol / Substance use:  Not Applicable Psych involvement (Current and /or in the community):  No (Comment)  Discharge Needs  Concerns to be addressed:  Discharge Planning Concerns Readmission within the last 30 days:  No Current discharge risk:  None Barriers to Discharge:  Continued Medical Work up   [employer], [place] 12/24/2018, 12:01 PM

## 2018-12-24 NOTE — Progress Notes (Signed)
Post HD Assessment    12/24/18 1547  Neurological  Level of Consciousness Alert  Orientation Level Appropriate for developmental age;Oriented X4  Respiratory  Respiratory Pattern Regular  Chest Assessment Chest expansion symmetrical  Bilateral Breath Sounds Diminished  Cough Non-productive  Cardiac  Pulse Irregular  Heart Sounds Murmur  ECG Monitor Yes  Vascular  R Radial Pulse +2  L Radial Pulse +2  Edema Generalized  Generalized Edema +1  Psychosocial  Psychosocial (WDL) WDL  Patient Behaviors Calm;Cooperative  Emotional support given Given to patient

## 2018-12-24 NOTE — Progress Notes (Signed)
HD Tx started w/o complication    14/97/02 1230  Vital Signs  Pulse Rate 100  Pulse Rate Source Monitor  Resp 20  BP 101/62  BP Location Right Arm  BP Method Automatic  Patient Position (if appropriate) Lying  Oxygen Therapy  SpO2 96 %  O2 Device Room Air  During Hemodialysis Assessment  Blood Flow Rate (mL/min) 400 mL/min  Arterial Pressure (mmHg) -250 mmHg  Venous Pressure (mmHg) 130 mmHg  Transmembrane Pressure (mmHg) 60 mmHg  Ultrafiltration Rate (mL/min) 570 mL/min  Dialysate Flow Rate (mL/min) 600 ml/min  Conductivity: Machine  14.1  HD Safety Checks Performed Yes  Dialysis Fluid Bolus Normal Saline  Bolus Amount (mL) 250 mL  Intra-Hemodialysis Comments Tx initiated  Fistula / Graft Left Upper arm  No Placement Date or Time found.   Orientation: Left  Access Location: Upper arm  Status Accessed  Needle Size 15 g  Drainage Description None

## 2018-12-24 NOTE — Progress Notes (Signed)
Pre HD Tx    12/24/18 1224  Vital Signs  Temp 98.1 F (36.7 C)  Temp Source Oral  Pulse Rate 100  Pulse Rate Source Monitor  Resp 18  BP 103/68  BP Location Right Arm  BP Method Automatic  Patient Position (if appropriate) Lying  Oxygen Therapy  SpO2 99 %  O2 Device Room Air  Pain Assessment  Pain Scale 0-10  Pain Score 0  Dialysis Weight  Weight 93.1 kg  Type of Weight Pre-Dialysis  Time-Out for Hemodialysis  What Procedure? HD   Pt Identifiers(min of two) First/Last Name;MRN/Account#  Correct Site? Yes  Correct Side? Yes  Correct Procedure? Yes  Consents Verified? Yes  Rad Studies Available? Yes  Safety Precautions Reviewed? Yes  Engineer, civil (consulting) Number 1  Station Number 1  UF/Alarm Test Passed  Conductivity: Meter 14  Conductivity: Machine  14.1  pH 7.4  Reverse Osmosis Main  Normal Saline Lot Number [id number]  Dialyzer Lot Number [id number]  Disposable Set Lot Number [id number]  Machine Temperature 98.6 F (37 C)  Musician and Audible Yes  Blood Lines Intact and Secured Yes  Pre Treatment Patient Checks  Vascular access used during treatment Graft  Hepatitis B Surface Antigen Results Negative  Date Hepatitis B Surface Antigen Drawn 09/08/18  Hepatitis B Surface Antibody  (>10)  Date Hepatitis B Surface Antibody Drawn 09/08/18  Hemodialysis Consent Verified Yes  Hemodialysis Standing Orders Initiated Yes  ECG (Telemetry) Monitor On Yes  Prime Ordered Normal Saline  Length of  DialysisTreatment -hour(s) 3.5 Hour(s)  Dialysis Treatment Comments Na 140  Dialyzer Elisio 17H NR  Dialysate 3K, 2.5 Ca  Dialysis Anticoagulant None  Dialysate Flow Ordered 600  Blood Flow Rate Ordered 400 mL/min  Ultrafiltration Goal 1 Liters  Dialysis Blood Pressure Support Ordered Normal Saline  Education / Care Plan  Dialysis Education Provided Yes (Pt educated on fluid management )  Documented Education in Care Plan Yes  Fistula / Graft Left Upper arm  No  Placement Date or Time found.   Orientation: Left  Access Location: Upper arm  Site Condition No complications  Fistula / Graft Assessment Present;Thrill;Bruit  Status Patent  Drainage Description None

## 2018-12-24 NOTE — NC FL2 (Signed)
[place] LEVEL OF CARE SCREENING TOOL     IDENTIFICATION  Patient Name: Kelly [name] Birthdate: 1981-03-16 Sex: female Admission Date (Current Location): 12/22/2018  [place] and [place] Number:  Engineering geologist and Address:  [facility], [address] [place], [place] [address]      Provider Number: [id number]  Attending Physician Name and Address:  Dustin Flock, MD  Relative Name and Phone Number:       Current Level of Care: Hospital Recommended Level of Care: [facility] Prior Approval Number:    Date Approved/Denied:   PASRR Number:    Discharge Plan: Other (Comment)(Family Care home )    Current Diagnoses: Patient Active Problem List   Diagnosis Date Noted  . Influenza A 12/22/2018  . Hypotension 12/22/2018  . Complication from renal dialysis device 11/06/2018  . Multiple fractures of both lower extremities 06/18/2017  . 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 Hyperkalemia 06/25/2016  . Adjustment disorder with mixed disturbance of emotions and conduct   . Diabetes ([place]) 04/23/2015  . Essential hypertension 04/23/2015  . ESRD on dialysis ([place]) 04/23/2015  . Pain of left arm 04/22/2015    Orientation RESPIRATION BLADDER Height & Weight     Self  Normal Continent Weight: 204 lb 5.9 oz (92.7 kg) Height:  5\' 2"  (157.5 cm)  BEHAVIORAL SYMPTOMS/MOOD NEUROLOGICAL BOWEL NUTRITION STATUS  (none) (none) Continent Diet(Renal diet with 1200 ml fluid restriction)  AMBULATORY STATUS COMMUNICATION OF NEEDS Skin   Limited Assist Verbally Normal                       Personal Care Assistance Level of Assistance  Bathing, Feeding, Dressing Bathing Assistance: Limited assistance Feeding assistance: Independent Dressing Assistance: Limited assistance     Functional Limitations Info  Sight, Hearing, Speech Sight Info: Adequate Hearing Info: Adequate Speech Info: Adequate    SPECIAL CARE FACTORS  FREQUENCY                       Contractures Contractures Info: Not present    Additional Factors Info  Code Status, Allergies Code Status Info: Full Code  Allergies Info: NKA           Current Medications (12/24/2018):  This is the current hospital active medication list Current Facility-Administered Medications  Medication Dose Route Frequency Provider Last Rate Last Dose  . acetaminophen (TYLENOL) tablet 650 mg  650 mg Oral Q6H PRN Vaughan Basta, MD   650 mg at 12/23/18 0955  . albuterol (PROVENTIL) (2.5 MG/3ML) 0.083% nebulizer solution 3 mL  3 mL Inhalation Q4H PRN Vaughan Basta, MD      . arformoterol (BROVANA) nebulizer solution 15 mcg  15 mcg Nebulization BID Vaughan Basta, MD   15 mcg at 12/24/18 0747   And  . umeclidinium bromide (INCRUSE ELLIPTA) 62.5 MCG/INH 1 puff  1 puff Inhalation Daily Vaughan Basta, MD   1 puff at 12/23/18 0958  . aspirin chewable tablet 81 mg  81 mg Oral Daily Vaughan Basta, MD   81 mg at 12/23/18 0955  . budesonide (PULMICORT) nebulizer solution 0.25 mg  0.25 mg Nebulization BID Vaughan Basta, MD   0.25 mg at 12/24/18 0747  . calcium acetate (PHOSLO) capsule 1,334 mg  1,334 mg Oral BID WC Vaughan Basta, MD   1,334 mg at 12/23/18 0955  . Chlorhexidine Gluconate Cloth 2 % PADS 6 each  6 each Topical Q0600 Singh, Harmeet,  MD   6 each at 12/24/18 0626  . cinacalcet (SENSIPAR) tablet 60 mg  60 mg Oral Q breakfast Vaughan Basta, MD   60 mg at 12/23/18 0955  . divalproex (DEPAKOTE) DR tablet 500 mg  500 mg Oral q morning - 10a Vaughan Basta, MD   500 mg at 12/23/18 3474   And  . divalproex (DEPAKOTE) DR tablet 1,000 mg  1,000 mg Oral QHS Vaughan Basta, MD   1,000 mg at 12/23/18 2004  . docusate sodium (COLACE) capsule 100 mg  100 mg Oral QHS Vaughan Basta, MD   100 mg at 12/23/18 2004  . docusate sodium (COLACE) capsule 100 mg  100 mg Oral BID PRN  Vaughan Basta, MD      . escitalopram (LEXAPRO) tablet 10 mg  10 mg Oral Daily Vaughan Basta, MD   10 mg at 12/23/18 0955  . folic acid (FOLVITE) tablet 2 mg  2 mg Oral QHS Vaughan Basta, MD   2 mg at 12/23/18 2005  . glucagon (human recombinant) (GLUCAGEN) injection 1 mg  1 mg Intravenous Once PRN Vaughan Basta, MD      . guaiFENesin (MUCINEX) 12 hr tablet 600 mg  600 mg Oral BID Dustin Flock, MD      . guaiFENesin-dextromethorphan (ROBITUSSIN DM) 100-10 MG/5ML syrup 15 mL  15 mL Oral Q4H PRN Dustin Flock, MD   15 mL at 12/24/18 1119  . heparin injection 5,000 Units  5,000 Units Subcutaneous Q8H Vaughan Basta, MD   5,000 Units at 12/23/18 1429  . insulin aspart (novoLOG) injection 0-5 Units  0-5 Units Subcutaneous QHS Vaughan Basta, MD      . insulin aspart (novoLOG) injection 0-9 Units  0-9 Units Subcutaneous TID WC Vaughan Basta, MD   1 Units at 12/23/18 1806  . insulin glargine (LANTUS) injection 20 Units  20 Units Subcutaneous QHS Vaughan Basta, MD   20 Units at 12/23/18 2115  . lamoTRIgine (LAMICTAL) tablet 150 mg  150 mg Oral BID Vaughan Basta, MD   150 mg at 12/23/18 2004  . LORazepam (ATIVAN) injection 1 mg  1 mg Intravenous Q6H PRN Dustin Flock, MD   1 mg at 12/23/18 1429  . midodrine (PROAMATINE) tablet 10 mg  10 mg Oral Q M,W,F Vaughan Basta, MD      . multivitamin (RENA-VIT) tablet 1 tablet  1 tablet Oral Daily Vaughan Basta, MD   1 tablet at 12/23/18 0956  . multivitamin with minerals tablet 1 tablet  1 tablet Oral Daily Vaughan Basta, MD   1 tablet at 12/23/18 0955  . ondansetron (ZOFRAN) tablet 4 mg  4 mg Oral TID PRN Vaughan Basta, MD      . oseltamivir (TAMIFLU) capsule 30 mg  30 mg Oral Q M,W,F-1800 Shanlever, Pierce Crane, RPH      . oxyCODONE (Oxy IR/ROXICODONE) immediate release tablet 5 mg  5 mg Oral Q6H PRN Vaughan Basta, MD      . pantoprazole  (PROTONIX) EC tablet 40 mg  40 mg Oral Daily Vaughan Basta, MD   40 mg at 12/23/18 0955  . QUEtiapine (SEROQUEL) tablet 300 mg  300 mg Oral QHS Vaughan Basta, MD   300 mg at 12/23/18 2004  . risperiDONE (RISPERDAL) tablet 0.5 mg  0.5 mg Oral BID Vaughan Basta, MD   0.5 mg at 12/23/18 2004  . traZODone (DESYREL) tablet 100 mg  100 mg Oral QHS Vaughan Basta, MD   100 mg at 12/23/18 2004     Discharge Medications:  Please see discharge summary for a list of discharge medications.  Relevant Imaging Results:  Relevant Lab Results:   Additional Information    Mollie Rossano  Louretta Shorten, LCSWA

## 2018-12-24 NOTE — Progress Notes (Signed)
[place] at [facility]                                                                                                                                                                                  Patient Demographics   Kelly [name], is a 38 y.o. female, DOB - 05-19-1981, HMC:[id number]  Admit date - 12/22/2018   Admitting Physician Vaughan Basta, MD  Outpatient Primary MD for the patient is Lorelee Market, MD   LOS - 2  Subjective:  Patient complains of cough   Review of Systems:   CONSTITUTIONAL: Limited due to patient poor historian  Vitals:   Vitals:   12/24/18 0757 12/24/18 0937 12/24/18 0945 12/24/18 1150  BP:  (!) 124/56 (!) 103/52 (!) 119/53  Pulse:  (!) 105 (!) 103 (!) 105  Resp:  (!) 24 16 20   Temp:  98.8 F (37.1 C) 98.8 F (37.1 C) 97.7 F (36.5 C)  TempSrc:  Oral Oral Axillary  SpO2: 96% 93% 94% 95%  Weight:      Height:        Wt Readings from Last 3 Encounters:  12/22/18 92.7 kg  11/06/18 92.6 kg  10/26/18 90.2 kg     Intake/Output Summary (Last 24 hours) at 12/24/2018 1313 Last data filed at 12/23/2018 1908 Gross per 24 hour  Intake -  Output 400 ml  Net -400 ml    Physical Exam:   GENERAL: Pleasant-appearing in no apparent distress.  HEAD, EYES, EARS, NOSE AND THROAT: Atraumatic, normocephalic. Extraocular muscles are intact. Pupils equal and reactive to light. Sclerae anicteric. No conjunctival injection. No oro-pharyngeal erythema.  NECK: Supple. There is no jugular venous distention. No bruits, no lymphadenopathy, no thyromegaly.  HEART: Regular rate and rhythm,. No murmurs, no rubs, no clicks.  LUNGS: Clear to auscultation bilaterally. No rales or rhonchi. No wheezes.  ABDOMEN: Soft, flat, nontender, nondistended. Has good bowel sounds. No hepatosplenomegaly appreciated.  EXTREMITIES: No evidence of any cyanosis, clubbing, or peripheral edema.  +2 pedal and radial pulses bilaterally.   NEUROLOGIC: The patient is alert, awake, and oriented x3 with no focal motor or sensory deficits appreciated bilaterally.  SKIN: Moist and warm with no rashes appreciated.  Psych: Not anxious, depressed LN: No inguinal LN enlargement    Antibiotics   Anti-infectives (From admission, onward)   Start     Dose/Rate Route Frequency Ordered Stop   12/23/18 0000  oseltamivir (TAMIFLU) capsule 30 mg     30 mg Oral Every M-W-F (1800) 12/22/18 1506 12/29/18 1759   12/22/18 1400  oseltamivir (TAMIFLU) capsule 75 mg  Status:  Discontinued     75 mg Oral  Once 12/22/18 1353 12/22/18 1356   12/22/18 1400  oseltamivir (TAMIFLU) capsule 30 mg     30 mg Oral  Once 12/22/18 1356 12/22/18 1414      Medications   Scheduled Meds: . arformoterol  15 mcg Nebulization BID   And  . umeclidinium bromide  1 puff Inhalation Daily  . aspirin  81 mg Oral Daily  . budesonide (PULMICORT) nebulizer solution  0.25 mg Nebulization BID  . calcium acetate  1,334 mg Oral BID WC  . Chlorhexidine Gluconate Cloth  6 each Topical Q0600  . cinacalcet  60 mg Oral Q breakfast  . divalproex  500 mg Oral q morning - 10a   And  . divalproex  1,000 mg Oral QHS  . docusate sodium  100 mg Oral QHS  . escitalopram  10 mg Oral Daily  . folic acid  2 mg Oral QHS  . guaiFENesin  600 mg Oral BID  . heparin  5,000 Units Subcutaneous Q8H  . insulin aspart  0-5 Units Subcutaneous QHS  . insulin aspart  0-9 Units Subcutaneous TID WC  . insulin glargine  20 Units Subcutaneous QHS  . lamoTRIgine  150 mg Oral BID  . midodrine  10 mg Oral Q M,W,F  . multivitamin  1 tablet Oral Daily  . multivitamin with minerals  1 tablet Oral Daily  . oseltamivir  30 mg Oral Q M,W,F-1800  . pantoprazole  40 mg Oral Daily  . QUEtiapine  300 mg Oral QHS  . risperiDONE  0.5 mg Oral BID  . traZODone  100 mg Oral QHS   Continuous Infusions: PRN Meds:.acetaminophen, albuterol, docusate sodium, glucagon (human recombinant),  guaiFENesin-dextromethorphan, LORazepam, ondansetron, oxyCODONE   Data Review:   Micro Results Recent Results (from the past 240 hour(s))  MRSA PCR Screening     Status: None   Collection Time: 12/23/18  4:22 AM  Result Value Ref Range Status   MRSA by PCR NEGATIVE NEGATIVE Final    Comment:        The GeneXpert MRSA Assay (FDA approved for NASAL specimens only), is one component of a comprehensive MRSA colonization surveillance program. It is not intended to diagnose MRSA infection nor to guide or monitor treatment for MRSA infections. Performed at [facility], [address] [place], [place] [address]     Radiology Reports Dg Chest Portable 1 View  Result Date: 12/22/2018 CLINICAL DATA:  Cough after dialysis. EXAM: PORTABLE CHEST 1 VIEW COMPARISON:  Two-view chest x-ray 10/26/2018. FINDINGS: Heart size is exaggerated by low lung volumes. Mild pulmonary vascular congestion is present. There is no airspace disease or effusions. A left subclavian dialysis catheter is in place. IMPRESSION: Low lung volumes and mild pulmonary vascular congestion without focal airspace disease. Electronically Signed   By: [place] Morelle M.D.   On: 12/22/2018 13:30     CBC Recent Labs  Lab 12/22/18 1206 12/23/18 0814  WBC 6.5 4.7  HGB 8.1* 8.3*  HCT 25.9* 27.3*  PLT 107* 103*  MCV 109.3* 110.1*  MCH 34.2* 33.5  MCHC 31.3 30.4  RDW 16.1* 15.8*  LYMPHSABS 0.9  --   MONOABS 0.4  --   EOSABS 0.2  --   BASOSABS 0.0  --     Chemistries  Recent Labs  Lab 12/22/18 1206 12/23/18 0814  NA 137 139  K 3.0* 3.5  CL 97* 100  CO2 30 28  GLUCOSE 106* 116*  BUN 23* 35*  CREATININE 6.14* 8.72*  CALCIUM 8.4* 9.2   ------------------------------------------------------------------------------------------------------------------  estimated creatinine clearance is 9.4 mL/min (A) (by C-G formula based on SCr of 8.72 mg/dL  (H)). ------------------------------------------------------------------------------------------------------------------ No results for input(s): HGBA1C in the last 72 hours. ------------------------------------------------------------------------------------------------------------------ No results for input(s): CHOL, HDL, LDLCALC, TRIG, CHOLHDL, LDLDIRECT in the last 72 hours. ------------------------------------------------------------------------------------------------------------------ No results for input(s): TSH, T4TOTAL, T3FREE, THYROIDAB in the last 72 hours.  Invalid input(s): FREET3 ------------------------------------------------------------------------------------------------------------------ No results for input(s): VITAMINB12, FOLATE, FERRITIN, TIBC, IRON, RETICCTPCT in the last 72 hours.  Coagulation profile No results for input(s): INR, PROTIME in the last 168 hours.  No results for input(s): DDIMER in the last 72 hours.  Cardiac Enzymes Recent Labs  Lab 12/22/18 1206  TROPONINI 0.03*   ------------------------------------------------------------------------------------------------------------------ Invalid input(s): POCBNP    Assessment & Plan   *Influenza A Tamiflu per pharmacy dosing. Droplet precaution. Add antitussive  *Hypotension Likely due to flu and receiving hemodialysis. Blood pressure stable  *Diabetes mellitus without complication We will continue Lantus as home and keep on sliding scale coverage. Glucose stable  *Bipolar disorder We will continue Depakote and Lamictal at home dose.  *Depression Continue Lexapro.  *Active smoking Counseled to quit smoking for 4 minutes and offered nicotine patch.  *End-stage renal disease on hemodialysis Neurology aware of patient's admission  *Hypokalemia Potassium stable      Code Status Orders  (From admission, onward)         Start     Ordered   12/22/18 1608  Full code   Continuous     12/22/18 1607        Code Status History    Date Active Date Inactive Code Status Order ID Comments User Context   06/19/2017 0024 07/01/2017 2026 Full Code [id number]  Harvie Bridge, DO Inpatient   06/26/2016 1544 06/26/2016 1758 DNR [id number]  Katha Cabal, MD Inpatient   06/25/2016 0751 06/25/2016 0759 Full Code [id number]  Hower, Aaron Mose, MD ED   04/22/2015 1822 04/24/2015 0002 Full Code [id number]  Schnier, Dolores Lory, MD Inpatient           Consults nephrology  DVT Prophylaxis  heparin  Lab Results  Component Value Date   PLT 103 (L) 12/23/2018     Time Spent in minutes   22min Greater than 50% of time spent in care coordination and counseling patient regarding the condition and plan of care.   Dustin Flock M.D on 12/24/2018 at 1:13 PM  Between 7am to 6pm - Pager - [phone]  After 6pm go to [email or web address] - Proofreader  [employer]   Office  [phone]

## 2018-12-25 LAB — GLUCOSE, CAPILLARY
Glucose-Capillary: 169 mg/dL — ABNORMAL HIGH (ref 70–99)
Glucose-Capillary: 100 mg/dL — ABNORMAL HIGH (ref 70–99)
Glucose-Capillary: 138 mg/dL — ABNORMAL HIGH (ref 70–99)
Glucose-Capillary: 108 mg/dL — ABNORMAL HIGH (ref 70–99)

## 2018-12-25 MED ORDER — ONDANSETRON HCL 4 MG/2ML IJ SOLN
4.0000 mg | Freq: Four times a day (QID) | INTRAMUSCULAR | Status: DC | PRN
  Administered 2018-12-25: 4 mg via INTRAVENOUS
  Filled 2018-12-25: qty 2

## 2018-12-25 NOTE — Consult Note (Signed)
Pharmacy Antibiotic Note  Kelly [name] is a 38 y.o. female admitted on 12/22/2018 with flu A positive.  Pharmacy has been consulted for tamiflu dosing.  Plan: Patient received 1 30mg  dose after HD Monday (12/22/18)   Continue 30mg  after each HD treatment  (total 5 days of therapy). Last Dose 1/31.  Height: 5\' 2"  (157.5 cm) Weight: 202 lb 13.2 oz (92 kg) IBW/kg (Calculated) : 50.1  Temp (24hrs), Avg:98.2 F (36.8 C), Min:97.7 F (36.5 C), Max:98.8 F (37.1 C)  Recent Labs  Lab 12/22/18 1206 12/22/18 1400 12/23/18 0814  WBC 6.5  --  4.7  CREATININE 6.14*  --  8.72*  LATICACIDVEN  --  0.8  --     Estimated Creatinine Clearance: 9.4 mL/min (A) (by C-G formula based on SCr of 8.72 mg/dL (H)).    No Known Allergies  Antimicrobials this admission: Tamiflu 1/27 >>    Dose adjustments this admission: None  Microbiology results: 12/22/18 Flu A Positive  Thank you for allowing pharmacy to be a part of this patient's care.  Pernell Dupre, PharmD, BCPS Clinical Pharmacist 12/25/2018 8:23 AM

## 2018-12-25 NOTE — Progress Notes (Signed)
[place], [place] 12/25/18  Subjective:   Patient known to our practice from outpatient dialysis.  Admitted for cough and weakness for the past 2 days.  tested positive for influenza A Denies acute c/o Resting quietly  Objective:  Vital signs in last 24 hours:  Temp:  [97.7 F (36.5 C)-98.2 F (36.8 C)] 98.2 F (36.8 C) (01/30 0754) Pulse Rate:  [93-117] 93 (01/30 0754) Resp:  [10-26] 16 (01/30 0754) BP: (82-125)/(34-76) 104/60 (01/30 0754) SpO2:  [93 %-100 %] 96 % (01/30 0754) Weight:  [92 kg-93.1 kg] 92 kg (01/29 1545)  Weight change:  Filed Weights   12/22/18 1142 12/24/18 1224 12/24/18 1545  Weight: 92.7 kg 93.1 kg 92 kg    Intake/Output:    Intake/Output Summary (Last 24 hours) at 12/25/2018 1052 Last data filed at 12/24/2018 1545 Gross per 24 hour  Intake -  Output 2156 ml  Net -2156 ml     Physical Exam: General:  No acute distress, generalized weakness  HEENT  moist oral mucous membranes  Neck  supple  Pulm/lungs  room air,  Coarse breath sounds  CVS/Heart  regular, no rub  Abdomen:   Soft, nontender  Extremities:  No edema  Neurologic:  able to follow commands  Skin:  No acute rashes  Access:  Left upper arm AV fistula       Basic Metabolic Panel:  Recent Labs  Lab 12/22/18 1206 12/23/18 0814  NA 137 139  K 3.0* 3.5  CL 97* 100  CO2 30 28  GLUCOSE 106* 116*  BUN 23* 35*  CREATININE 6.14* 8.72*  CALCIUM 8.4* 9.2     CBC: Recent Labs  Lab 12/22/18 1206 12/23/18 0814  WBC 6.5 4.7  NEUTROABS 4.9  --   HGB 8.1* 8.3*  HCT 25.9* 27.3*  MCV 109.3* 110.1*  PLT 107* 103*      Lab Results  Component Value Date   HEPBSAG Negative 06/19/2017   HEPBSAB Reactive 08/29/2016      Microbiology:  Recent Results (from the past 240 hour(s))  MRSA PCR Screening     Status: None   Collection Time: 12/23/18  4:22 AM  Result Value Ref Range Status   MRSA by PCR NEGATIVE NEGATIVE Final    Comment:         The GeneXpert MRSA Assay (FDA approved for NASAL specimens only), is one component of a comprehensive MRSA colonization surveillance program. It is not intended to diagnose MRSA infection nor to guide or monitor treatment for MRSA infections. Performed at [facility], [address] [place], [place] [address]     Coagulation Studies: No results for input(s): LABPROT, INR in the last 72 hours.  Urinalysis: No results for input(s): COLORURINE, LABSPEC, PHURINE, GLUCOSEU, HGBUR, BILIRUBINUR, KETONESUR, PROTEINUR, UROBILINOGEN, NITRITE, LEUKOCYTESUR in the last 72 hours.  Invalid input(s): APPERANCEUR    Imaging: No results found.   Medications:    . arformoterol  15 mcg Nebulization BID   And  . umeclidinium bromide  1 puff Inhalation Daily  . aspirin  81 mg Oral Daily  . budesonide (PULMICORT) nebulizer solution  0.25 mg Nebulization BID  . calcium acetate  1,334 mg Oral BID WC  . Chlorhexidine Gluconate Cloth  6 each Topical Q0600  . cinacalcet  60 mg Oral Q breakfast  . divalproex  500 mg Oral q morning - 10a   And  . divalproex  1,000 mg Oral QHS  . docusate sodium  100 mg  Oral QHS  . escitalopram  10 mg Oral Daily  . folic acid  2 mg Oral QHS  . guaiFENesin  600 mg Oral BID  . heparin  5,000 Units Subcutaneous Q8H  . insulin aspart  0-5 Units Subcutaneous QHS  . insulin aspart  0-9 Units Subcutaneous TID WC  . insulin glargine  20 Units Subcutaneous QHS  . lamoTRIgine  150 mg Oral BID  . midodrine  10 mg Oral Q M,W,F  . multivitamin  1 tablet Oral Daily  . multivitamin with minerals  1 tablet Oral Daily  . oseltamivir  30 mg Oral Q M,W,F-1800  . pantoprazole  40 mg Oral Daily  . QUEtiapine  300 mg Oral QHS  . risperiDONE  0.5 mg Oral BID  . traZODone  100 mg Oral QHS   acetaminophen, albuterol, docusate sodium, glucagon (human recombinant), guaiFENesin-dextromethorphan, LORazepam, ondansetron, oxyCODONE  Assessment/ Plan:  38 y.o. female  with developmental delay, schizophrenia, ESRD, hypertension, diabetes type 2, history of bilateral metatarsal fractures and right fibula fracture in July 2018 status post surgery  [facility]/Monday Wednesday Friday/[facility]  1.  End-stage renal disease 2100 cc removed with hemodialysis yesterday Next HD planned for tomorrow  2.  Anemia of chronic kidney disease Hemoglobin 8.3.  We will continue Epogen with hemodialysis  3.  Influenza A+ Tamiflu as per hospitalist team  4.  Secondary hyperparathyroidism Continue home regimen of calcium acetate and cinacalcet with meals    LOS: [place] 1/30/202010:52 AM  [place], [place]  Note: This note was prepared with Dragon dictation. Any transcription errors are unintentional

## 2018-12-25 NOTE — Progress Notes (Signed)
[place] at [facility]                                                                                                                                                                                  Patient Demographics   Kelly [name], is a 38 y.o. female, DOB - 10-Dec-1980, FWY:[id number]  Admit date - 12/22/2018   Admitting Physician Vaughan Basta, MD  Outpatient Primary MD for the patient is Lorelee Market, MD   LOS - 3  Subjective:  Patient continues to complain of shortness of breath and cough   Review of Systems:   CONSTITUTIONAL: Limited due to patient poor historian  Vitals:   Vitals:   12/25/18 0626 12/25/18 0726 12/25/18 0733 12/25/18 0754  BP: 121/73   104/60  Pulse: 98   93  Resp: 20   16  Temp: 97.9 F (36.6 C)   98.2 F (36.8 C)  TempSrc: Oral   Axillary  SpO2: 93% 97% 97% 96%  Weight:      Height:        Wt Readings from Last 3 Encounters:  12/24/18 92 kg  11/06/18 92.6 kg  10/26/18 90.2 kg     Intake/Output Summary (Last 24 hours) at 12/25/2018 1049 Last data filed at 12/24/2018 1545 Gross per 24 hour  Intake -  Output 2156 ml  Net -2156 ml    Physical Exam:   GENERAL: Pleasant-appearing in no apparent distress.  HEAD, EYES, EARS, NOSE AND THROAT: Atraumatic, normocephalic. Extraocular muscles are intact. Pupils equal and reactive to light. Sclerae anicteric. No conjunctival injection. No oro-pharyngeal erythema.  NECK: Supple. There is no jugular venous distention. No bruits, no lymphadenopathy, no thyromegaly.  HEART: Regular rate and rhythm,. No murmurs, no rubs, no clicks.  LUNGS: Clear to auscultation bilaterally. No rales or rhonchi. No wheezes.  ABDOMEN: Soft, flat, nontender, nondistended. Has good bowel sounds. No hepatosplenomegaly appreciated.  EXTREMITIES: No evidence of any cyanosis, clubbing, or peripheral edema.  +2 pedal and radial pulses bilaterally.  NEUROLOGIC: The patient is  alert, awake, not oriented  sKIN: Moist and warm with no rashes appreciated.  Psych: Not anxious, depressed LN: No inguinal LN enlargement    Antibiotics   Anti-infectives (From admission, onward)   Start     Dose/Rate Route Frequency Ordered Stop   12/23/18 0000  oseltamivir (TAMIFLU) capsule 30 mg     30 mg Oral Every M-W-F (1800) 12/22/18 1506 12/29/18 1759   12/22/18 1400  oseltamivir (TAMIFLU) capsule 75 mg  Status:  Discontinued     75 mg Oral  Once 12/22/18 1353 12/22/18 1356   12/22/18 1400  oseltamivir (TAMIFLU) capsule 30  mg     30 mg Oral  Once 12/22/18 1356 12/22/18 1414      Medications   Scheduled Meds: . arformoterol  15 mcg Nebulization BID   And  . umeclidinium bromide  1 puff Inhalation Daily  . aspirin  81 mg Oral Daily  . budesonide (PULMICORT) nebulizer solution  0.25 mg Nebulization BID  . calcium acetate  1,334 mg Oral BID WC  . Chlorhexidine Gluconate Cloth  6 each Topical Q0600  . cinacalcet  60 mg Oral Q breakfast  . divalproex  500 mg Oral q morning - 10a   And  . divalproex  1,000 mg Oral QHS  . docusate sodium  100 mg Oral QHS  . escitalopram  10 mg Oral Daily  . folic acid  2 mg Oral QHS  . guaiFENesin  600 mg Oral BID  . heparin  5,000 Units Subcutaneous Q8H  . insulin aspart  0-5 Units Subcutaneous QHS  . insulin aspart  0-9 Units Subcutaneous TID WC  . insulin glargine  20 Units Subcutaneous QHS  . lamoTRIgine  150 mg Oral BID  . midodrine  10 mg Oral Q M,W,F  . multivitamin  1 tablet Oral Daily  . multivitamin with minerals  1 tablet Oral Daily  . oseltamivir  30 mg Oral Q M,W,F-1800  . pantoprazole  40 mg Oral Daily  . QUEtiapine  300 mg Oral QHS  . risperiDONE  0.5 mg Oral BID  . traZODone  100 mg Oral QHS   Continuous Infusions: PRN Meds:.acetaminophen, albuterol, docusate sodium, glucagon (human recombinant), guaiFENesin-dextromethorphan, LORazepam, ondansetron, oxyCODONE   Data Review:   Micro Results Recent Results  (from the past 240 hour(s))  MRSA PCR Screening     Status: None   Collection Time: 12/23/18  4:22 AM  Result Value Ref Range Status   MRSA by PCR NEGATIVE NEGATIVE Final    Comment:        The GeneXpert MRSA Assay (FDA approved for NASAL specimens only), is one component of a comprehensive MRSA colonization surveillance program. It is not intended to diagnose MRSA infection nor to guide or monitor treatment for MRSA infections. Performed at [facility], [address], [place], [place] [address]     Radiology Reports Dg Chest Portable 1 View  Result Date: 12/22/2018 CLINICAL DATA:  Cough after dialysis. EXAM: PORTABLE CHEST 1 VIEW COMPARISON:  Two-view chest x-ray 10/26/2018. FINDINGS: Heart size is exaggerated by low lung volumes. Mild pulmonary vascular congestion is present. There is no airspace disease or effusions. A left subclavian dialysis catheter is in place. IMPRESSION: Low lung volumes and mild pulmonary vascular congestion without focal airspace disease. Electronically Signed   By: San Morelle M.D.   On: 12/22/2018 13:30     CBC Recent Labs  Lab 12/22/18 1206 12/23/18 0814  WBC 6.5 4.7  HGB 8.1* 8.3*  HCT 25.9* 27.3*  PLT 107* 103*  MCV 109.3* 110.1*  MCH 34.2* 33.5  MCHC 31.3 30.4  RDW 16.1* 15.8*  LYMPHSABS 0.9  --   MONOABS 0.4  --   EOSABS 0.2  --   BASOSABS 0.0  --     Chemistries  Recent Labs  Lab 12/22/18 1206 12/23/18 0814  NA 137 139  K 3.0* 3.5  CL 97* 100  CO2 30 28  GLUCOSE 106* 116*  BUN 23* 35*  CREATININE 6.14* 8.72*  CALCIUM 8.4* 9.2   ------------------------------------------------------------------------------------------------------------------ estimated creatinine clearance is 9.4 mL/min (A) (by C-G formula based  on SCr of 8.72 mg/dL (H)). ------------------------------------------------------------------------------------------------------------------ No results for input(s): HGBA1C in the last 72  hours. ------------------------------------------------------------------------------------------------------------------ No results for input(s): CHOL, HDL, LDLCALC, TRIG, CHOLHDL, LDLDIRECT in the last 72 hours. ------------------------------------------------------------------------------------------------------------------ No results for input(s): TSH, T4TOTAL, T3FREE, THYROIDAB in the last 72 hours.  Invalid input(s): FREET3 ------------------------------------------------------------------------------------------------------------------ No results for input(s): VITAMINB12, FOLATE, FERRITIN, TIBC, IRON, RETICCTPCT in the last 72 hours.  Coagulation profile No results for input(s): INR, PROTIME in the last 168 hours.  No results for input(s): DDIMER in the last 72 hours.  Cardiac Enzymes Recent Labs  Lab 12/22/18 1206  TROPONINI 0.03*   ------------------------------------------------------------------------------------------------------------------ Invalid input(s): POCBNP    Assessment & Plan   *Influenza A Tamiflu per pharmacy dosing. Droplet precaution. Continue antitussives  *Hypotension Likely due to flu and receiving hemodialysis. Blood pressure stable  *Diabetes mellitus without complication We will continue Lantus as home and keep on sliding scale coverage. Glucose stable  *Bipolar disorder We will continue Depakote and Lamictal at home dose.  *Depression Continue Lexapro.  *Active smoking Counseled to quit smoking for 4 minutes and offered nicotine patch.  *End-stage renal disease on hemodialysis Neurology aware of patient's admission  *Hypokalemia Potassium stable  Potential discharge tomorrow     Code Status Orders  (From admission, onward)         Start     Ordered   12/22/18 1608  Full code  Continuous     12/22/18 1607        Code Status History    Date Active Date Inactive Code Status Order ID Comments User Context    06/19/2017 0024 07/01/2017 2026 Full Code [id number]  Harvie Bridge, DO Inpatient   06/26/2016 1544 06/26/2016 1758 DNR [id number]  Katha Cabal, MD Inpatient   06/25/2016 0751 06/25/2016 0759 Full Code [id number]  Hower, Aaron Mose, MD ED   04/22/2015 1822 04/24/2015 0002 Full Code [id number]  Schnier, Dolores Lory, MD Inpatient           Consults nephrology  DVT Prophylaxis  heparin  Lab Results  Component Value Date   PLT 103 (L) 12/23/2018     Time Spent in minutes   38min Greater than 50% of time spent in care coordination and counseling patient regarding the condition and plan of care.   Dustin Flock M.D on 12/25/2018 at 10:49 AM  Between 7am to 6pm - Pager - [phone]  After 6pm go to [email or web address] - Proofreader  [employer]   Office  [phone]

## 2018-12-26 LAB — GLUCOSE, CAPILLARY
GLUCOSE-CAPILLARY: 103 mg/dL — AB (ref 70–99)
Glucose-Capillary: 38 mg/dL — CL (ref 70–99)
Glucose-Capillary: 53 mg/dL — ABNORMAL LOW (ref 70–99)
Glucose-Capillary: 66 mg/dL — ABNORMAL LOW (ref 70–99)
Glucose-Capillary: 170 mg/dL — ABNORMAL HIGH (ref 70–99)

## 2018-12-26 LAB — RENAL FUNCTION PANEL
Albumin: 3.5 g/dL (ref 3.5–5.0)
Anion gap: 8 (ref 5–15)
BUN: 14 mg/dL (ref 6–20)
CO2: 31 mmol/L (ref 22–32)
Calcium: 9.2 mg/dL (ref 8.9–10.3)
Chloride: 99 mmol/L (ref 98–111)
Creatinine, Ser: 4.72 mg/dL — ABNORMAL HIGH (ref 0.44–1.00)
GFR calc non Af Amer: 11 mL/min — ABNORMAL LOW (ref >60)
GFR, EST AFRICAN AMERICAN: 13 mL/min — AB (ref >60)
Glucose, Bld: 105 mg/dL — ABNORMAL HIGH (ref 70–99)
Phosphorus: 1.8 mg/dL — ABNORMAL LOW (ref 2.5–4.6)
Potassium: 3.6 mmol/L (ref 3.5–5.1)
Sodium: 138 mmol/L (ref 135–145)

## 2018-12-26 MED ORDER — OXYCODONE HCL 5 MG PO TABS: 5.0000 mg | ORAL_TABLET | Freq: Four times a day (QID) | ORAL | 0 refills | Status: DC | PRN

## 2018-12-26 MED ORDER — RISPERIDONE 0.5 MG PO TABS: 0.5000 mg | ORAL_TABLET | Freq: Two times a day (BID) | ORAL | 0 refills | Status: DC

## 2018-12-26 NOTE — Progress Notes (Signed)
[place], [place] 12/26/18  Subjective:   Patient known to our practice from outpatient dialysis.  Admitted for cough and weakness for 2 days PTA.  tested positive for influenza A Denies acute c/o Still coughing  Objective:  Vital signs in last 24 hours:  Temp:  [97.7 F (36.5 C)-98.6 F (37 C)] 97.7 F (36.5 C) (01/31 0822) Pulse Rate:  [95-112] 95 (01/31 0822) Resp:  [17-18] 17 (01/31 0554) BP: (106-121)/(42-70) 113/56 (01/31 0822) SpO2:  [89 %-100 %] 89 % (01/31 0822)  Weight change:  Filed Weights   12/22/18 1142 12/24/18 1224 12/24/18 1545  Weight: 92.7 kg 93.1 kg 92 kg    Intake/Output:    Intake/Output Summary (Last 24 hours) at 12/26/2018 0923 Last data filed at 12/26/2018 0816 Gross per 24 hour  Intake 237 ml  Output -  Net 237 ml     Physical Exam: General:  No acute distress, generalized weakness  HEENT  moist oral mucous membranes  Neck  supple  Pulm/lungs  room air,  Coarse breath sounds  CVS/Heart  regular, no rub  Abdomen:   Soft, nontender  Extremities:  No edema  Neurologic:  able to follow commands  Skin:  No acute rashes  Access:  Left upper arm AV fistula       Basic Metabolic Panel:  Recent Labs  Lab 12/22/18 1206 12/23/18 0814  NA 137 139  K 3.0* 3.5  CL 97* 100  CO2 30 28  GLUCOSE 106* 116*  BUN 23* 35*  CREATININE 6.14* 8.72*  CALCIUM 8.4* 9.2     CBC: Recent Labs  Lab 12/22/18 1206 12/23/18 0814  WBC 6.5 4.7  NEUTROABS 4.9  --   HGB 8.1* 8.3*  HCT 25.9* 27.3*  MCV 109.3* 110.1*  PLT 107* 103*      Lab Results  Component Value Date   HEPBSAG Negative 06/19/2017   HEPBSAB Reactive 08/29/2016      Microbiology:  Recent Results (from the past 240 hour(s))  MRSA PCR Screening     Status: None   Collection Time: 12/23/18  4:22 AM  Result Value Ref Range Status   MRSA by PCR NEGATIVE NEGATIVE Final    Comment:        The GeneXpert MRSA Assay (FDA approved for NASAL  specimens only), is one component of a comprehensive MRSA colonization surveillance program. It is not intended to diagnose MRSA infection nor to guide or monitor treatment for MRSA infections. Performed at [facility], [address] [place], [place] [address]     Coagulation Studies: No results for input(s): LABPROT, INR in the last 72 hours.  Urinalysis: No results for input(s): COLORURINE, LABSPEC, PHURINE, GLUCOSEU, HGBUR, BILIRUBINUR, KETONESUR, PROTEINUR, UROBILINOGEN, NITRITE, LEUKOCYTESUR in the last 72 hours.  Invalid input(s): APPERANCEUR    Imaging: No results found.   Medications:    . arformoterol  15 mcg Nebulization BID   And  . umeclidinium bromide  1 puff Inhalation Daily  . aspirin  81 mg Oral Daily  . budesonide (PULMICORT) nebulizer solution  0.25 mg Nebulization BID  . calcium acetate  1,334 mg Oral BID WC  . Chlorhexidine Gluconate Cloth  6 each Topical Q0600  . cinacalcet  60 mg Oral Q breakfast  . divalproex  500 mg Oral q morning - 10a   And  . divalproex  1,000 mg Oral QHS  . docusate sodium  100 mg Oral QHS  . escitalopram  10 mg Oral Daily  .  folic acid  2 mg Oral QHS  . guaiFENesin  600 mg Oral BID  . heparin  5,000 Units Subcutaneous Q8H  . insulin aspart  0-5 Units Subcutaneous QHS  . insulin aspart  0-9 Units Subcutaneous TID WC  . insulin glargine  20 Units Subcutaneous QHS  . lamoTRIgine  150 mg Oral BID  . midodrine  10 mg Oral Q M,W,F  . multivitamin  1 tablet Oral Daily  . multivitamin with minerals  1 tablet Oral Daily  . oseltamivir  30 mg Oral Q M,W,F-1800  . pantoprazole  40 mg Oral Daily  . QUEtiapine  300 mg Oral QHS  . risperiDONE  0.5 mg Oral BID  . traZODone  100 mg Oral QHS   acetaminophen, albuterol, docusate sodium, glucagon (human recombinant), guaiFENesin-dextromethorphan, LORazepam, ondansetron (ZOFRAN) IV, ondansetron, oxyCODONE  Assessment/ Plan:  38 y.o. female with developmental delay,  schizophrenia, ESRD, hypertension, diabetes type 2, history of bilateral metatarsal fractures and right fibula fracture in July 2018 status post surgery  [facility]/Monday Wednesday Friday/[facility]  1.  End-stage renal disease HD later today.  2.  Anemia of chronic kidney disease Hemoglobin 8.3.  We will continue Epogen with hemodialysis  3.  Influenza A+ Tamiflu as per hospitalist team  4.  Secondary hyperparathyroidism Continue home regimen of calcium acetate and cinacalcet with meals    LOS: [place] 1/31/20209:23 [place], [place]  Note: This note was prepared with Dragon dictation. Any transcription errors are unintentional

## 2018-12-26 NOTE — Progress Notes (Addendum)
A.M. Medications held today, pt waiting for dialysis.  1500 dialysis nurse on unit to begin dialysis

## 2018-12-26 NOTE — Progress Notes (Signed)
Post HD Assessment    12/26/18 1804  Neurological  Level of Consciousness Alert  Orientation Level Oriented to person  Respiratory  Respiratory Pattern Regular  Chest Assessment Chest expansion symmetrical  Bilateral Breath Sounds Clear;Diminished  Cough Non-productive  Cardiac  Pulse Regular  ECG Monitor Yes  Cardiac Rhythm NSR  Vascular  R Radial Pulse +2  L Radial Pulse +2  Edema Generalized  Generalized Edema +1  Psychosocial  Psychosocial (WDL) WDL  Patient Behaviors Cooperative;Calm

## 2018-12-26 NOTE — Progress Notes (Signed)
Hypoglycemic Event  CBG: 53  Treatment: 8oz gngerale  Symptoms: N/A  Follow-up [id number] CBG Result:66  Treatment: 8oz gingrale   Symptoms N/A  Follow up CBG time 0924  CBG 103   Notified Patel, no new orders   Possible Reasons for Event: pt had not had breakfast  Comments/MD notified: Notified Patel, no new orders    [facility]

## 2018-12-26 NOTE — Progress Notes (Signed)
HD Tx started w/o complication    45/80/99 1454  Vital Signs  Pulse Rate (!) 101  Pulse Rate Source Dinamap  Resp 19  BP 106/60  BP Location Right Arm  BP Method Automatic  Patient Position (if appropriate) Lying  During Hemodialysis Assessment  Blood Flow Rate (mL/min) 400 mL/min  Arterial Pressure (mmHg) -190 mmHg  Venous Pressure (mmHg) 230 mmHg  Transmembrane Pressure (mmHg) 60 mmHg  Ultrafiltration Rate (mL/min) 750 mL/min  Dialysate Flow Rate (mL/min) 600 ml/min  Conductivity: Machine  14.1  HD Safety Checks Performed Yes  Dialysis Fluid Bolus Normal Saline  Bolus Amount (mL) 250 mL  Intra-Hemodialysis Comments Tx initiated  Fistula / Graft Left Upper arm  No Placement Date or Time found.   Orientation: Left  Access Location: Upper arm  Status Accessed  Needle Size 15g

## 2018-12-26 NOTE — Progress Notes (Signed)
Pre HD Tx   12/26/18 1445  Hand-Off documentation  Report given to (Full Name) Beatris Ship, RN   Vital Signs  Temp 97.8 F (36.6 C)  Temp Source Oral  Pulse Rate 96  Pulse Rate Source Dinamap  Resp 19  BP (!) 103/57  BP Location Right Arm  BP Method Automatic  Patient Position (if appropriate) Lying  Oxygen Therapy  SpO2 96 %  O2 Device Room Air  Pulse Oximetry Type Continuous  Pain Assessment  Pain Scale 0-10  Pain Score 0  Dialysis Weight  Weight 90.9 kg  Type of Weight Pre-Dialysis  Time-Out for Hemodialysis  What Procedure? HD  Pt Identifiers(min of two) First/Last Name;MRN/Account#  Correct Site? Yes  Correct Side? Yes  Correct Procedure? Yes  Consents Verified? Yes  Rad Studies Available? N/A  Safety Precautions Reviewed? Yes  Engineer, civil (consulting) Number 1  Station Number  (Bedside )  UF/Alarm Test Passed  Conductivity: Meter 14  Conductivity: Machine  14.1  pH 7.4  Reverse Osmosis Portable RO #1  Normal Saline Lot Number [id number]  Dialyzer Lot Number [id number]  Disposable Set Lot Number [id number]  Machine Temperature 98.6 F (37 C)  Musician and Audible Yes  Blood Lines Intact and Secured Yes  Pre Treatment Patient Checks  Vascular access used during treatment Graft  Hepatitis B Surface Antigen Results Negative  Date Hepatitis B Surface Antigen Drawn 09/08/18  Hepatitis B Surface Antibody  (>10)  Date Hepatitis B Surface Antibody Drawn 09/08/18  Hemodialysis Consent Verified Yes  Hemodialysis Standing Orders Initiated Yes  ECG (Telemetry) Monitor On Yes  Prime Ordered Normal Saline  Length of  DialysisTreatment -hour(s) 3 Hour(s)  Dialysis Treatment Comments Na 140  Dialyzer Elisio 17H NR  Dialysate 3K, 2.5 Ca  Dialysis Anticoagulant None  Dialysate Flow Ordered 600  Blood Flow Rate Ordered 400 mL/min  Ultrafiltration Goal 1.5 Liters  Dialysis Blood Pressure Support Ordered Normal Saline  Education / Care Plan  Dialysis Education  Provided Yes  Documented Education in Care Plan Yes  Fistula / Graft Left Upper arm  No Placement Date or Time found.   Orientation: Left  Access Location: Upper arm  Site Condition No complications  Fistula / Graft Assessment Present;Thrill;Bruit  Status Patent  Drainage Description None

## 2018-12-26 NOTE — Progress Notes (Signed)
Pre HD Assessment    12/26/18 1440  Neurological  Level of Consciousness Alert  Orientation Level Oriented to person  Respiratory  Respiratory Pattern Regular  Chest Assessment Chest expansion symmetrical  Bilateral Breath Sounds Clear;Diminished  Cough Non-productive  Cardiac  Pulse Regular  ECG Monitor Yes  Cardiac Rhythm NSR  Vascular  R Radial Pulse +2  L Radial Pulse +2  Edema Generalized  Generalized Edema +1  Psychosocial  Psychosocial (WDL) WDL  Patient Behaviors Cooperative;Calm

## 2018-12-26 NOTE — Progress Notes (Signed)
HD Tx completed, tolerated well, uf goal met    12/26/18 1756  Vital Signs  Pulse Rate 93  Pulse Rate Source Dinamap  Resp 19  BP 116/63  Oxygen Therapy  SpO2 98 %  O2 Device Room Air  Pulse Oximetry Type Continuous  During Hemodialysis Assessment  HD Safety Checks Performed Yes  KECN 61.3 KECN  Dialysis Fluid Bolus Normal Saline  Bolus Amount (mL) 250 mL

## 2018-12-26 NOTE — Progress Notes (Signed)
Post HD TX   12/26/18 1800  Hand-Off documentation  Report given to (Full Name) Herminio Commons, RN   Report received from (Full Name) Beatris Ship, RN   Vital Signs  Temp 97.8 F (36.6 C)  Temp Source Oral  Pulse Rate 91  Pulse Rate Source Dinamap  Resp 19  Oxygen Therapy  SpO2 98 %  O2 Device Room Air  Pulse Oximetry Type Continuous  Pain Assessment  Pain Scale 0-10  Pain Score 0  Dialysis Weight  Weight 89.1 kg  Type of Weight Post-Dialysis  Post-Hemodialysis Assessment  Rinseback Volume (mL) 250 mL  KECN 61.3 V  Dialyzer Clearance Lightly streaked  Duration of HD Treatment -hour(s) 3 hour(s)  Hemodialysis Intake (mL) 500 mL  UF Total -Machine (mL) 2058 mL  Net UF (mL) 1558 mL  Tolerated HD Treatment Yes  AVG/AVF Arterial Site Held (minutes) 10 minutes  AVG/AVF Venous Site Held (minutes) 10 minutes  Fistula / Graft Left Upper arm  No Placement Date or Time found.   Orientation: Left  Access Location: Upper arm  Site Condition No complications  Fistula / Graft Assessment Present;Thrill;Bruit  Status Deaccessed  Drainage Description None

## 2018-12-26 NOTE — Clinical Social Work Note (Signed)
Patient is medically ready for discharge back to group home today. CSW notified [name], legal guardian [phone] of discharge today. CSW also notified Beaulah Dinning [phone] of discharge today. Mr. Oswaldo Milian will transport patient back to facility today after dialysis. CSW prepared DC packet for facility.   [place], [place]

## 2018-12-26 NOTE — Progress Notes (Signed)
Pt refused bath, peri care, tooth brush. Pt is refusing  Medications. Pt will take some at times (see mar)

## 2018-12-26 NOTE — Discharge Summary (Signed)
[facility] - [place] [facility],  y.o., DOB 1981-11-19, MRN [id number]. Admission date: 12/22/2018 Discharge Date 12/26/2018 Primary MD Lorelee Market, MD Admitting Physician Vaughan Basta, MD  Admission Diagnosis  Influenza A [J10.1] Hypotension, unspecified hypotension type [I95.9]  Discharge Diagnosis   Principal Problem: Influenza A Hypotension Diabetes type 2 Bipolar disorder Depression Nicotine abuse End-stage renal disease Hypokalemia    Hospital Course  Kelly [name]  is a 38 y.o. female with a known history of alcohol abuse, bipolar disorder, chronic kidney disease on hemodialysis, cognitive impairment and lives in group home, diabetes without complications and on insulin at home, hyperparathyroidism, hypertension, AV fistula and UTI-was sent from hemodialysis center to the emergency room because of hypotension noted during the dialysis session.  Further work-up showed patient was noted to have influenza A.  Patient was admitted for further evaluation and therapy.  Patient continued to have cough and intermittent low blood pressure.  Now she is doing better.  She is post treatment for her flu.             Consults  None  Significant Tests:  See full reports for all details     Dg Chest Portable 1 View  Result Date: 12/22/2018 CLINICAL DATA:  Cough after dialysis. EXAM: PORTABLE CHEST 1 VIEW COMPARISON:  Two-view chest x-ray 10/26/2018. FINDINGS: Heart size is exaggerated by low lung volumes. Mild pulmonary vascular congestion is present. There is no airspace disease or effusions. A left subclavian dialysis catheter is in place. IMPRESSION: Low lung volumes and mild pulmonary vascular congestion without focal airspace disease. Electronically Signed   By: San Morelle M.D.   On: 12/22/2018 13:30       Today   Subjective:   Kelly [name] patient doing well denies any complaints Objective:    Blood pressure (!) 113/56, pulse 95, temperature 97.7 F (36.5 C), temperature source Oral, resp. rate 17, height 5\' 2"  (1.575 m), weight 92 kg, SpO2 (!) 89 %.  .  Intake/Output Summary (Last 24 hours) at 12/26/2018 1235 Last data filed at 12/26/2018 0816 Gross per 24 hour  Intake 237 ml  Output -  Net 237 ml    Exam VITAL SIGNS: Blood pressure (!) 113/56, pulse 95, temperature 97.7 F (36.5 C), temperature source Oral, resp. rate 17, height 5\' 2"  (1.575 m), weight 92 kg, SpO2 (!) 89 %.  GENERAL:  38 y.o.-year-old patient lying in the bed with no acute distress.  EYES: Pupils equal, round, reactive to light and accommodation. No scleral icterus. Extraocular muscles intact.  HEENT: Head atraumatic, normocephalic. Oropharynx and nasopharynx clear.  NECK:  Supple, no jugular venous distention. No thyroid enlargement, no tenderness.  LUNGS: Normal breath sounds bilaterally, no wheezing, rales,rhonchi or crepitation. No use of accessory muscles of respiration.  CARDIOVASCULAR: S1, S2 normal. No murmurs, rubs, or gallops.  ABDOMEN: Soft, nontender, nondistended. Bowel sounds present. No organomegaly or mass.  EXTREMITIES: No pedal edema, cyanosis, or clubbing.  NEUROLOGIC: Cranial nerves II through XII are intact. Muscle strength 5/5 in all extremities. Sensation intact. Gait not checked.  PSYCHIATRIC: The patient is alert and oriented x 3.  SKIN: No obvious rash, lesion, or ulcer.   Data Review     CBC w Diff:  Lab Results  Component Value Date   WBC 4.7 12/23/2018   HGB 8.3 (L) 12/23/2018   HGB 8.1 (L) 12/22/2014   HCT 27.3 (L) 12/23/2018   HCT 25.8 (L) 12/22/2014   PLT  103 (L) 12/23/2018   PLT 117 (L) 12/22/2014   LYMPHOPCT 14 12/22/2018   LYMPHOPCT 34.6 12/22/2014   MONOPCT 7 12/22/2018   MONOPCT 6.3 12/22/2014   EOSPCT 3 12/22/2018   EOSPCT 3.9 12/22/2014   BASOPCT 0 12/22/2018   BASOPCT 0.4 12/22/2014   CMP:  Lab Results  Component Value Date   NA 139 12/23/2018    NA 135 (L) 12/24/2014   K 3.5 12/23/2018   K 6.3 (H) 12/24/2014   CL 100 12/23/2018   CL 100 12/24/2014   CO2 28 12/23/2018   CO2 28 12/24/2014   BUN 35 (H) 12/23/2018   BUN 75 (H) 12/24/2014   CREATININE 8.72 (H) 12/23/2018   CREATININE 8.81 (H) 12/24/2014   PROT 7.3 04/20/2017   PROT 7.4 12/10/2014   ALBUMIN 3.2 (L) 07/01/2017   ALBUMIN 2.6 (L) 12/20/2014   BILITOT 0.6 04/20/2017   BILITOT 0.3 12/10/2014   ALKPHOS 97 04/20/2017   ALKPHOS 91 12/10/2014   AST 17 04/20/2017   AST 10 (L) 12/10/2014   ALT 11 (L) 04/20/2017   ALT < 6 (L) 12/10/2014  .  Micro Results Recent Results (from the past 240 hour(s))  MRSA PCR Screening     Status: None   Collection Time: 12/23/18  4:22 AM  Result Value Ref Range Status   MRSA by PCR NEGATIVE NEGATIVE Final    Comment:        The GeneXpert MRSA Assay (FDA approved for NASAL specimens only), is one component of a comprehensive MRSA colonization surveillance program. It is not intended to diagnose MRSA infection nor to guide or monitor treatment for MRSA infections. Performed at [facility], [address] [place], [place] [address]         Code Status Orders  (From admission, onward)         Start     Ordered   12/22/18 1608  Full code  Continuous     12/22/18 1607        Code Status History    Date Active Date Inactive Code Status Order ID Comments User Context   06/19/2017 0024 07/01/2017 2026 Full Code [id number]  Harvie Bridge, DO Inpatient   06/26/2016 1544 06/26/2016 1758 DNR [id number]  Katha Cabal, MD Inpatient   06/25/2016 0751 06/25/2016 0759 Full Code [id number]  Hower, Aaron Mose, MD ED   04/22/2015 1822 04/24/2015 0002 Full Code [id number]  Schnier, Dolores Lory, MD Inpatient            Discharge Medications   Allergies as of 12/26/2018   No Known Allergies     Medication List    TAKE these medications   acetaminophen 325 MG tablet Commonly known as:  TYLENOL Take 2 tablets (650  mg total) by mouth every 6 (six) hours as needed for mild pain, moderate pain or headache. What changed:    when to take this  additional instructions   albuterol 108 (90 Base) MCG/ACT inhaler Commonly known as:  PROVENTIL HFA;VENTOLIN HFA Inhale 2 puffs into the lungs every 4 (four) hours as needed for wheezing or shortness of breath.   amLODipine 5 MG tablet Commonly known as:  NORVASC Take 5 mg by mouth daily.   aspirin 81 MG chewable tablet Chew 1 tablet (81 mg total) by mouth daily.   calcium acetate 667 MG capsule Commonly known as:  PHOSLO Take 2 capsules (1,334 mg total) by mouth 2 (two) times daily with a meal.   cetaphil lotion  Apply 1 application topically daily.   cinacalcet 60 MG tablet Commonly known as:  SENSIPAR Take 1 tablet (60 mg total) by mouth daily.   cloNIDine 0.1 MG tablet Commonly known as:  CATAPRES Take 1 tablet (0.1 mg total) by mouth 2 (two) times daily.   divalproex 500 MG DR tablet Commonly known as:  DEPAKOTE Take 2 tablets (1,000 mg total) by mouth at bedtime. What changed:    how much to take  when to take this  additional instructions   docusate sodium 100 MG capsule Commonly known as:  COLACE Take 1 capsule (100 mg total) by mouth at bedtime.   escitalopram 10 MG tablet Commonly known as:  LEXAPRO Take 1.5 tablets (15 mg total) by mouth daily. What changed:    how much to take  additional instructions   fluticasone 110 MCG/ACT inhaler Commonly known as:  FLOVENT HFA Inhale 2 puffs into the lungs 2 (two) times daily.   folic acid 1 MG tablet Commonly known as:  FOLVITE Take 2 tablets (2 mg total) by mouth at bedtime.   GLUCAGON EMERGENCY 1 MG injection Generic drug:  glucagon Inject 1 mg into the vein once as needed (low blood sugar).   hydrALAZINE 25 MG tablet Commonly known as:  APRESOLINE Take 25 mg by mouth 3 (three) times daily.   insulin aspart 100 UNIT/ML injection Commonly known as:  novoLOG Inject  0-15 Units into the skin 3 (three) times daily with meals. Per sliding scale   insulin glargine 100 UNIT/ML injection Commonly known as:  LANTUS Inject 20 Units into the skin at bedtime.   INVEGA SUSTENNA 156 MG/ML Susy injection Generic drug:  paliperidone Inject 156 mg into the muscle See admin instructions. EVERY 28 DAYS   irbesartan 300 MG tablet Commonly known as:  AVAPRO Take 300 mg by mouth at bedtime.   lamoTRIgine 150 MG tablet Commonly known as:  LAMICTAL Take 150 mg by mouth 2 (two) times daily.   lidocaine-prilocaine cream Commonly known as:  EMLA Apply 1 application topically 3 (three) times a week. Apply to access site 30 minutes before dialysis. What changed:  when to take this   medroxyPROGESTERone 150 MG/ML injection Commonly known as:  DEPO-PROVERA Inject 150 mg into the muscle every 3 (three) months.   metoCLOPramide 10 MG tablet Commonly known as:  REGLAN Take 10 mg by mouth 2 (two) times daily.   metoprolol tartrate 50 MG tablet Commonly known as:  LOPRESSOR Take 1 tablet (50 mg total) by mouth 2 (two) times daily.   midodrine 10 MG tablet Commonly known as:  PROAMATINE Take 10 mg by mouth every Monday, Wednesday, and Friday. Prior to dialysis   ondansetron 4 MG tablet Commonly known as:  ZOFRAN Take 4 mg by mouth 3 (three) times daily as needed for nausea or vomiting.   oxyCODONE 5 MG immediate release tablet Commonly known as:  Oxy IR/ROXICODONE Take 5 mg by mouth every 6 (six) hours as needed for severe pain.   pantoprazole 40 MG tablet Commonly known as:  PROTONIX Take 40 mg by mouth daily.   patiromer 8.4 g packet Commonly known as:  VELTASSA Take 1 packet (8.4 g total) by mouth daily. What changed:  additional instructions   QUEtiapine 300 MG tablet Commonly known as:  SEROQUEL Take 300 mg by mouth at bedtime.   RENA-VITE RX 1 MG Tabs Take 1 tablet by mouth daily.   risperiDONE 0.5 MG tablet Commonly known as:  RISPERDAL Take  0.5 mg by mouth 2 (two) times daily.   STIOLTO RESPIMAT IN Inhale 2 puffs into the lungs daily.   THEREMS Tabs Take 1 tablet by mouth daily.   traZODone 100 MG tablet Commonly known as:  DESYREL Take 1 tablet (100 mg total) by mouth at bedtime.   vitamin B-12 1000 MCG tablet Commonly known as:  CYANOCOBALAMIN Take 1 tablet (1,000 mcg total) by mouth at bedtime.          Total Time in preparing paper work, data evaluation and todays exam - 25 minutes  Dustin Flock M.D on 12/26/2018 at 12:35 PM [place]  [phone]

## 2018-12-26 NOTE — Progress Notes (Signed)
Group home representative, Romilus Watlington, present for pt discharge; discharge packet given to him for pt discharge; no questions voiced by Romilus at this time; pt discharged via wheelchair by nursing to the visitor's entrance

## 2019-01-09 ENCOUNTER — Ambulatory Visit: Payer: Medicaid Other | Admitting: Podiatry

## 2019-01-20 ENCOUNTER — Emergency Department (HOSPITAL_COMMUNITY)
Admission: EM | Admit: 2019-01-20 | Discharge: 2019-01-20 | Disposition: A | Discharge status: Home / Self Care | Payer: Medicaid Other | Attending: Emergency Medicine | Admitting: Emergency Medicine

## 2019-01-20 ENCOUNTER — Emergency Department (HOSPITAL_COMMUNITY): Payer: Medicaid Other

## 2019-01-20 DIAGNOSIS — Z794 Long term (current) use of insulin: Secondary | ICD-10-CM | POA: Diagnosis not present

## 2019-01-20 DIAGNOSIS — F17210 Nicotine dependence, cigarettes, uncomplicated: Secondary | ICD-10-CM | POA: Diagnosis not present

## 2019-01-20 DIAGNOSIS — Z79899 Other long term (current) drug therapy: Secondary | ICD-10-CM | POA: Insufficient documentation

## 2019-01-20 DIAGNOSIS — Z7982 Long term (current) use of aspirin: Secondary | ICD-10-CM | POA: Diagnosis not present

## 2019-01-20 DIAGNOSIS — E162 Hypoglycemia, unspecified: Secondary | ICD-10-CM

## 2019-01-20 DIAGNOSIS — E11649 Type 2 diabetes mellitus with hypoglycemia without coma: Secondary | ICD-10-CM | POA: Insufficient documentation

## 2019-01-20 DIAGNOSIS — N189 Chronic kidney disease, unspecified: Secondary | ICD-10-CM | POA: Diagnosis not present

## 2019-01-20 DIAGNOSIS — I129 Hypertensive chronic kidney disease with stage 1 through stage 4 chronic kidney disease, or unspecified chronic kidney disease: Secondary | ICD-10-CM | POA: Insufficient documentation

## 2019-01-20 DIAGNOSIS — R531 Weakness: Secondary | ICD-10-CM | POA: Diagnosis present

## 2019-01-20 LAB — CBC WITH DIFFERENTIAL/PLATELET
Abs Immature Granulocytes: 0.12 10*3/uL — ABNORMAL HIGH (ref 0.00–0.07)
Basophils Absolute: 0 10*3/uL (ref 0.0–0.1)
Basophils Relative: 1 %
Eosinophils Absolute: 0.1 10*3/uL (ref 0.0–0.5)
Eosinophils Relative: 3 %
HCT: 34.9 % — ABNORMAL LOW (ref 36.0–46.0)
Hemoglobin: 10.3 g/dL — ABNORMAL LOW (ref 12.0–15.0)
Immature Granulocytes: 3 %
LYMPHS ABS: 1.8 10*3/uL (ref 0.7–4.0)
Lymphocytes Relative: 39 %
MCH: 34 pg (ref 26.0–34.0)
MCHC: 29.5 g/dL — ABNORMAL LOW (ref 30.0–36.0)
MCV: 115.2 fL — ABNORMAL HIGH (ref 80.0–100.0)
Monocytes Absolute: 0.5 10*3/uL (ref 0.1–1.0)
Monocytes Relative: 10 %
Neutro Abs: 2.2 10*3/uL (ref 1.7–7.7)
Neutrophils Relative %: 44 %
Platelets: 134 10*3/uL — ABNORMAL LOW (ref 150–400)
RBC: 3.03 MIL/uL — ABNORMAL LOW (ref 3.87–5.11)
RDW: 17.2 % — ABNORMAL HIGH (ref 11.5–15.5)
WBC: 4.7 10*3/uL (ref 4.0–10.5)
nRBC: 0 % (ref 0.0–0.2)

## 2019-01-20 LAB — CBG MONITORING, ED
GLUCOSE-CAPILLARY: 186 mg/dL — AB (ref 70–99)
Glucose-Capillary: 54 mg/dL — ABNORMAL LOW (ref 70–99)
Glucose-Capillary: 88 mg/dL (ref 70–99)
Glucose-Capillary: 118 mg/dL — ABNORMAL HIGH (ref 70–99)

## 2019-01-20 LAB — COMPREHENSIVE METABOLIC PANEL
ALBUMIN: 3.2 g/dL — AB (ref 3.5–5.0)
ALT: 11 U/L (ref 0–44)
AST: 19 U/L (ref 15–41)
Alkaline Phosphatase: 79 U/L (ref 38–126)
Anion gap: 13 (ref 5–15)
BUN: 26 mg/dL — AB (ref 6–20)
CO2: 29 mmol/L (ref 22–32)
CREATININE: 8.11 mg/dL — AB (ref 0.44–1.00)
Calcium: 9.6 mg/dL (ref 8.9–10.3)
Chloride: 100 mmol/L (ref 98–111)
GFR calc Af Amer: 7 mL/min — ABNORMAL LOW (ref >60)
GFR calc non Af Amer: 6 mL/min — ABNORMAL LOW (ref >60)
GLUCOSE: 67 mg/dL — AB (ref 70–99)
Potassium: 3.5 mmol/L (ref 3.5–5.1)
Sodium: 142 mmol/L (ref 135–145)
Total Bilirubin: 0.2 mg/dL — ABNORMAL LOW (ref 0.3–1.2)
Total Protein: 6.7 g/dL (ref 6.5–8.1)

## 2019-01-20 LAB — ETHANOL: Alcohol, Ethyl (B): <10 mg/dL (ref <10)

## 2019-01-20 NOTE — ED Provider Notes (Signed)
Group home services were contacted.  They will come and pick up the patient.   Varney Biles, MD 01/20/19 [phone]

## 2019-01-20 NOTE — ED Provider Notes (Signed)
[facility] EMERGENCY DEPARTMENT Provider Note   CSN: [id number] Arrival date & time: 01/20/19  1937    History   Chief Complaint Chief Complaint  Patient presents with  . Hypoglycemia  . Altered Mental Status    HPI Kelly [name] is a 38 y.o. female.     Patient is a 38 year old female with a history of diabetes, hypertension, end-stage renal disease on dialysis, alcohol abuse and cognitive impairment who presents with weakness.  She lives in a group home and per EMS report, they have noted that she is more sleepy than she normally is.  Patient has been oriented and conversive in route per EMS.  She has not complained of anything.  They state that she has had some increased swelling in her arms and her face.  Patient does not report any pain anywhere.  No shortness of breath.  No known fevers.  No vomiting.  History is limited as patient is not very cooperative on exam.  She refused an IV per EMS.  She did complete her dialysis yesterday without issue.  She was recently admitted for influenza.  Per EMS the patient's blood sugar was in the 60s on arrival with fire department and she was given oral glucose.     Past Medical History:  Diagnosis Date  . Alcohol abuse    chronic  . Bipolar disorder ([place])   . Chronic kidney disease   . Cognitive changes   . Cognitive impairment   . Depression   . Depression   . Diabetes mellitus without complication ([place])   . Elevated lipids   . Hyperparathyroidism ([place])   . Hypertension   . Staph aureus infection    A/V fistula  . UTI (lower urinary tract infection)     Patient Active Problem List   Diagnosis Date Noted  . Influenza A 12/22/2018  . Hypotension 12/22/2018  . Complication from renal dialysis device 11/06/2018  . Multiple fractures of both lower extremities 06/18/2017  . 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 Hyperkalemia 06/25/2016  . Adjustment disorder with mixed disturbance of emotions and  conduct   . Diabetes ([place]) 04/23/2015  . Essential hypertension 04/23/2015  . ESRD on dialysis ([place]) 04/23/2015  . Pain of left arm 04/22/2015    Past Surgical History:  Procedure Laterality Date  . A/V SHUNTOGRAM Left 10/07/2018   Procedure: A/V SHUNTOGRAM;  Surgeon: Katha Cabal, MD;  Location: [facility];  Service: Cardiovascular;  Laterality: Left;  . AV FISTULA PLACEMENT    . CHOLECYSTECTOMY    . dialysis perma cath Right    chest  . PERIPHERAL VASCULAR CATHETERIZATION N/A 05/24/2015   Procedure: Dialysis/Perma Catheter Removal;  Surgeon: Katha Cabal, MD;  Location: [facility];  Service: Cardiovascular;  Laterality: N/A;  . PERIPHERAL VASCULAR CATHETERIZATION Left 02/28/2016   Procedure: A/V Shuntogram/Fistulagram;  Surgeon: Katha Cabal, MD;  Location: [facility];  Service: Cardiovascular;  Laterality: Left;  . PERIPHERAL VASCULAR CATHETERIZATION N/A 02/28/2016   Procedure: A/V Shunt Intervention;  Surgeon: Katha Cabal, MD;  Location: [facility];  Service: Cardiovascular;  Laterality: N/A;  . PERIPHERAL VASCULAR CATHETERIZATION N/A 06/26/2016   Procedure: A/V Shuntogram/Fistulagram;  Surgeon: Katha Cabal, MD;  Location: [facility];  Service: Cardiovascular;  Laterality: N/A;  . PERIPHERAL VASCULAR CATHETERIZATION N/A 09/10/2016   Procedure: A/V Shuntogram/Fistulagram;  Surgeon: Algernon Huxley, MD;  Location: [facility];  Service: Cardiovascular;  Laterality: N/A;  . PERIPHERAL VASCULAR CATHETERIZATION Left 09/14/2016   Procedure: Thrombectomy;  Surgeon: Katha Cabal, MD;  Location: [facility];  Service: Cardiovascular;  Laterality: Left;  Marland Kitchen VASCULAR ACCESS DEVICE INSERTION Left 04/22/2015   Procedure: INSERTION OF HERO VASCULAR ACCESS DEVICE;  Surgeon: Katha Cabal, MD;  Location: [facility];  Service: Vascular;  Laterality: Left;     OB History   No obstetric history on file.       Home Medications    Prior to Admission medications   Medication Sig Start Date End Date Taking? Authorizing Provider  acetaminophen (TYLENOL) 325 MG tablet Take 2 tablets (650 mg total) by mouth every 6 (six) hours as needed for mild pain, moderate pain or headache. Patient taking differently: Take 650 mg by mouth See admin instructions. Take 650 mg every Monday, Wednesday, and Friday before dialysis, may take 650 mg every 6 hours as needed for pain or headaches 10/01/16  Yes Clapacs, Madie Reno, MD  albuterol (PROVENTIL HFA;VENTOLIN HFA) 108 (90 Base) MCG/ACT inhaler Inhale 2 puffs into the lungs every 4 (four) hours as needed for wheezing or shortness of breath.   Yes [provider]  aspirin 81 MG chewable tablet Chew 1 tablet (81 mg total) by mouth daily. 10/02/16  Yes Clapacs, Madie Reno, MD  B Complex-C-Folic Acid (RENA-VITE RX) 1 MG TABS Take 1 tablet by mouth daily. 10/01/16  Yes Clapacs, Madie Reno, MD  calcium acetate (PHOSLO) 667 MG capsule Take 2 capsules (1,334 mg total) by mouth 2 (two) times daily with a meal. 10/01/16  Yes Clapacs, Madie Reno, MD  cetaphil (CETAPHIL) lotion Apply 1 application topically daily.   Yes [provider]  cinacalcet (SENSIPAR) 60 MG tablet Take 1 tablet (60 mg total) by mouth daily. 10/01/16  Yes Clapacs, Madie Reno, MD  divalproex (DEPAKOTE) 500 MG DR tablet Take 2 tablets (1,000 mg total) by mouth at bedtime. Patient taking differently: Take 500-1,000 mg by mouth See admin instructions. Take 500 mg in the morning and 1000 mg at bedtime 10/01/16  Yes Clapacs, Madie Reno, MD  docusate sodium (COLACE) 100 MG capsule Take 1 capsule (100 mg total) by mouth at bedtime. 10/01/16  Yes Clapacs, Madie Reno, MD  escitalopram (LEXAPRO) 10 MG tablet Take 1.5 tablets (15 mg total) by mouth daily. Patient taking differently: Take 10 mg by mouth daily. Take with 5 mg tablet to equal 15 mg daily 10/02/16  Yes Clapacs, Madie Reno, MD  escitalopram (LEXAPRO) 5 MG tablet Take 5 mg by  mouth daily. Takes along with 10mg  totally 15mg  daily.   Yes [provider]  fluticasone (FLOVENT HFA) 110 MCG/ACT inhaler Inhale 2 puffs into the lungs 2 (two) times daily.   Yes [provider]  folic acid (FOLVITE) 1 MG tablet Take 2 tablets (2 mg total) by mouth at bedtime. 10/01/16  Yes Clapacs, Madie Reno, MD  glucagon (GLUCAGON EMERGENCY) 1 MG injection Inject 1 mg into the vein once as needed (low blood sugar).   Yes [provider]  insulin aspart (NOVOLOG) 100 UNIT/ML injection Inject 0-15 Units into the skin 3 (three) times daily with meals. Per sliding scale    Yes [provider]  insulin glargine (LANTUS) 100 UNIT/ML injection Inject 20 Units into the skin at bedtime.   Yes [provider]  irbesartan (AVAPRO) 300 MG tablet Take 300 mg by mouth at bedtime.   Yes [provider]  lamoTRIgine (LAMICTAL) 150 MG tablet Take  150 mg by mouth 2 (two) times daily.    Yes [provider]  lidocaine-prilocaine (EMLA) cream Apply 1 application topically 3 (three) times a week. Apply to access site 30 minutes before dialysis. Patient taking differently: Apply 1 application topically every Monday, Wednesday, and Friday. Apply to access site 30 minutes before dialysis.  10/01/16  Yes Clapacs, Madie Reno, MD  medroxyPROGESTERone (DEPO-PROVERA) 150 MG/ML injection Inject 150 mg into the muscle every 3 (three) months.   Yes [provider]  metoCLOPramide (REGLAN) 10 MG tablet Take 10 mg by mouth 2 (two) times daily.    Yes [provider]  metoprolol (LOPRESSOR) 50 MG tablet Take 1 tablet (50 mg total) by mouth 2 (two) times daily. 10/01/16  Yes Clapacs, Madie Reno, MD  midodrine (PROAMATINE) 10 MG tablet Take 10 mg by mouth every Monday, Wednesday, and Friday. Prior to dialysis   Yes [provider]  Multiple Vitamin (THEREMS) TABS Take 1 tablet by mouth daily. 10/01/16  Yes Clapacs, Madie Reno, MD  oxyCODONE (OXY IR/ROXICODONE) 5  MG immediate release tablet Take 1 tablet (5 mg total) by mouth every 6 (six) hours as needed for severe pain. 12/26/18  Yes Dustin Flock, MD  paliperidone (INVEGA SUSTENNA) 156 MG/ML SUSY injection Inject 156 mg into the muscle See admin instructions. EVERY 28 DAYS   Yes [provider]  pantoprazole (PROTONIX) 40 MG tablet Take 40 mg by mouth daily.   Yes [provider]  patiromer (VELTASSA) 8.4 g packet Take 1 packet (8.4 g total) by mouth daily. Patient taking differently: Take 8.4 g by mouth daily. Mixed into 1/3 cup of drink 10/01/16  Yes Clapacs, Madie Reno, MD  QUEtiapine (SEROQUEL) 300 MG tablet Take 300 mg by mouth at bedtime.   Yes [provider]  risperiDONE (RISPERDAL) 0.5 MG tablet Take 1 tablet (0.5 mg total) by mouth 2 (two) times daily. 12/26/18  Yes Dustin Flock, MD  Tiotropium Bromide-Olodaterol (STIOLTO RESPIMAT IN) Inhale 2 puffs into the lungs daily.    Yes [provider]  traZODone (DESYREL) 100 MG tablet Take 1 tablet (100 mg total) by mouth at bedtime. 10/01/16  Yes Clapacs, Madie Reno, MD  cloNIDine (CATAPRES) 0.1 MG tablet Take 1 tablet (0.1 mg total) by mouth 2 (two) times daily. Patient not taking: Reported on 01/20/2019 10/01/16   Clapacs, Madie Reno, MD  vitamin B-12 (CYANOCOBALAMIN) 1000 MCG tablet Take 1 tablet (1,000 mcg total) by mouth at bedtime. Patient not taking: Reported on 12/22/2018 10/01/16   Clapacs, Madie Reno, MD    Family History Family History  Family history unknown: Yes    Social History Social History   Tobacco Use  . Smoking status: Current Every Day Smoker    Packs/day: 0.25    Years: 0.00    Pack years: 0.00    Types: Cigarettes  . Smokeless tobacco: Current User  Substance Use Topics  . Alcohol use: No  . Drug use: No     Allergies   Patient has no known allergies.   Review of Systems Review of Systems  Constitutional: Positive for fatigue. Negative for chills, diaphoresis and fever.  HENT: Positive  for facial swelling. Negative for congestion, rhinorrhea and sneezing.   Eyes: Negative.   Respiratory: Negative for cough, chest tightness and shortness of breath.   Cardiovascular: Negative for chest pain and leg swelling.  Gastrointestinal: Negative for abdominal pain, blood in stool, diarrhea, nausea and vomiting.  Genitourinary: Negative for difficulty urinating, flank pain,  frequency and hematuria.  Musculoskeletal: Negative for arthralgias and back pain.  Skin: Negative for rash.  Neurological: Negative for dizziness, speech difficulty, weakness, numbness and headaches.     Physical Exam Updated Vital Signs BP (!) 120/59 (BP Location: Right Arm)   Pulse 85   Temp 98.1 F (36.7 C) (Oral)   Resp 20   SpO2 98%   Physical Exam Constitutional:      Appearance: She is well-developed.  HENT:     Head: Normocephalic and atraumatic.     Comments: Patient has some mild diffuse swelling of her face and upper extremities.  There is no rash.  No angioedema. Eyes:     Pupils: Pupils are equal, round, and reactive to light.  Neck:     Musculoskeletal: Normal range of motion and neck supple.  Cardiovascular:     Rate and Rhythm: Normal rate and regular rhythm.     Heart sounds: Normal heart sounds.  Pulmonary:     Effort: Pulmonary effort is normal. No respiratory distress.     Breath sounds: Normal breath sounds. No wheezing or rales.  Chest:     Chest wall: No tenderness.  Abdominal:     General: Bowel sounds are normal.     Palpations: Abdomen is soft.     Tenderness: There is no abdominal tenderness. There is no guarding or rebound.  Musculoskeletal: Normal range of motion.        General: Swelling (Mild swelling of her upper extremities.  No significant swelling to her lower extremities) present.  Lymphadenopathy:     Cervical: No cervical adenopathy.  Skin:    General: Skin is warm and dry.     Findings: No rash.  Neurological:     General: No focal deficit present.      Mental Status: She is alert and oriented to person, place, and time.      ED Treatments / Results  Labs (all labs ordered are listed, but only abnormal results are displayed) Labs Reviewed  COMPREHENSIVE METABOLIC PANEL - Abnormal; Notable for the following components:      Result Value   Glucose, Bld 67 (*)    BUN 26 (*)    Creatinine, Ser 8.11 (*)    Albumin 3.2 (*)    Total Bilirubin 0.2 (*)    GFR calc non Af Amer 6 (*)    GFR calc Af Amer 7 (*)    All other components within normal limits  CBC WITH DIFFERENTIAL/PLATELET - Abnormal; Notable for the following components:   RBC 3.03 (*)    Hemoglobin 10.3 (*)    HCT 34.9 (*)    MCV 115.2 (*)    MCHC 29.5 (*)    RDW 17.2 (*)    Platelets 134 (*)    Abs Immature Granulocytes 0.12 (*)    All other components within normal limits  CBG MONITORING, ED - Abnormal; Notable for the following components:   Glucose-Capillary 54 (*)    All other components within normal limits  CBG MONITORING, ED - Abnormal; Notable for the following components:   Glucose-Capillary 186 (*)    All other components within normal limits  CBG MONITORING, ED - Abnormal; Notable for the following components:   Glucose-Capillary 118 (*)    All other components within normal limits  ETHANOL  URINALYSIS, ROUTINE W REFLEX MICROSCOPIC  CBG MONITORING, ED    EKG EKG Interpretation  Date/Time:  Tuesday January 20 2019 08:51:03 EST Ventricular Rate:  86 PR Interval:  QRS Duration: 81 QT Interval:  410 QTC Calculation: 491 R Axis:   20 Text Interpretation:  Sinus rhythm Ventricular premature complex Borderline low voltage, extremity leads Borderline prolonged QT interval Baseline wander in lead(s) V6 since last tracing no significant change Confirmed by Malvin Johns [phone]) on 01/20/2019 8:54:09 AM Also confirmed by Malvin Johns [phone]), editor Philomena Doheny [phone])  on 01/20/2019 10:50:31 AM   Radiology Dg Chest 2 View  Result Date:  01/20/2019 CLINICAL DATA:  Weakness.  Dialysis patient. EXAM: CHEST - 2 VIEW COMPARISON:  12/22/2018 FINDINGS: Central line tip again at the [facility] RA junction. Heart size is normal. Mild pulmonary venous hypertension without frank edema. No effusions. No consolidation or collapse. IMPRESSION: Central line.  Pulmonary venous hypertension without frank edema. Electronically Signed   By: Nelson Chimes M.D.   On: 01/20/2019 09:54    Procedures Procedures (including critical care time)  Medications Ordered in ED Medications - No data to display   Initial Impression / Assessment and Plan / ED Course  I have reviewed the triage vital signs and the nursing notes.  Pertinent labs & imaging results that were available during my care of the patient were reviewed by me and considered in my medical decision making (see chart for details).        Patient presents with increased sleepiness.  She was found to have a slightly low blood sugar.  She has been monitored here for several hours and her blood sugars been stable.  She is fully alert and oriented.  She does not have any focal neurologic deficits.  Her other labs are non-concerning.  I do not see any suggestions of significant fluid overload.  Her lungs are clear without shortness of breath.  She has some mild swelling of her face which may be some slight fluid overload but I do not see any signs of angioedema.  No rash.  She is otherwise well-appearing.  She was discharged home in good condition.  Return precautions were given.  Final Clinical Impressions(s) / ED Diagnoses   Final diagnoses:  Hypoglycemia    ED Discharge Orders    None       Malvin Johns, MD 01/20/19 [phone]

## 2019-01-20 NOTE — ED Notes (Signed)
When asked about a urine sample, this RN asked pt if she still makes urine and her reply was "no"

## 2019-01-20 NOTE — ED Notes (Signed)
Help get patient on the monitor did ekg show to Dr Tamera Punt patient is resting with nurse at bedside and call bell in reach

## 2019-01-20 NOTE — Discharge Instructions (Addendum)
Check your blood sugar frequently.  Eat regular meals.  Follow-up with your primary care provider.  Return here as needed for any worsening symptoms.

## 2019-01-20 NOTE — ED Notes (Signed)
Checked patient cbg it was 28 notified RN of blood sugar

## 2019-01-20 NOTE — ED Triage Notes (Signed)
From "group home facility" per [facility] with facility staff stating that she had been "different " since dialysis yesterday-- on arrival, pt is alert/oriented x 4, has periorbital edema, - CBG initially was 66- given OJ increased to 86 per EMS>

## 2019-01-20 NOTE — ED Notes (Signed)
Checked patient cbg it was 8 notified RN of blood sugar

## 2019-01-20 NOTE — Discharge Planning (Signed)
EDCM consulted to assist with pt transportation home.  [facility] called Kelly [name] [phone] who will come and pick up pt promptly.

## 2019-01-20 NOTE — ED Notes (Signed)
Unable to get in touch with pts legal guardian

## 2019-01-21 ENCOUNTER — Ambulatory Visit: Payer: Medicaid Other

## 2019-01-27 ENCOUNTER — Ambulatory Visit: Payer: Medicaid Other | Admitting: Podiatry

## 2019-02-01 ENCOUNTER — Encounter (HOSPITAL_COMMUNITY): Payer: Self-pay

## 2019-02-01 ENCOUNTER — Ambulatory Visit (HOSPITAL_COMMUNITY)
Admission: EM | Admit: 2019-02-01 | Discharge: 2019-02-01 | Disposition: A | Discharge status: Home / Self Care | Payer: Medicaid Other | Attending: Family Medicine | Admitting: Family Medicine

## 2019-02-01 ENCOUNTER — Other Ambulatory Visit: Payer: Self-pay

## 2019-02-01 DIAGNOSIS — F79 Unspecified intellectual disabilities: Secondary | ICD-10-CM

## 2019-02-01 DIAGNOSIS — E119 Type 2 diabetes mellitus without complications: Secondary | ICD-10-CM | POA: Diagnosis not present

## 2019-02-01 DIAGNOSIS — W19XXXA Unspecified fall, initial encounter: Secondary | ICD-10-CM | POA: Diagnosis not present

## 2019-02-01 DIAGNOSIS — N186 End stage renal disease: Secondary | ICD-10-CM

## 2019-02-01 DIAGNOSIS — Z992 Dependence on renal dialysis: Secondary | ICD-10-CM | POA: Diagnosis not present

## 2019-02-01 DIAGNOSIS — S01511A Laceration without foreign body of lip, initial encounter: Secondary | ICD-10-CM

## 2019-02-01 DIAGNOSIS — I10 Essential (primary) hypertension: Secondary | ICD-10-CM

## 2019-02-01 LAB — GLUCOSE, CAPILLARY: Glucose-Capillary: 122 mg/dL — ABNORMAL HIGH (ref 70–99)

## 2019-02-01 MED ORDER — CHLORHEXIDINE GLUCONATE 0.12 % MT SOLN: 15.0000 mL | Freq: Two times a day (BID) | OROMUCOSAL | 0 refills | Status: AC

## 2019-02-01 MED ORDER — AMOXICILLIN 400 MG/5ML PO SUSR: 250.0000 mg | Freq: Every day | ORAL | 0 refills | Status: DC

## 2019-02-01 MED ORDER — CHLORHEXIDINE GLUCONATE 0.12 % MT SOLN: 15.0000 mL | Freq: Two times a day (BID) | OROMUCOSAL | 0 refills | Status: DC

## 2019-02-01 MED ORDER — AMOXICILLIN 400 MG/5ML PO SUSR: 250.0000 mg | Freq: Every day | ORAL | 0 refills | Status: AC

## 2019-02-01 NOTE — Discharge Instructions (Signed)
Please use chlorhexidine mouth rinse twice daily, rinse for approximately 1 minute and then spit out Please also begin taking amoxicillin once daily to help prevent infection, take after dialysis on dialysis days Apply ice to help with swelling Tylenol as needed for pain Please continue monitor for gradual healing

## 2019-02-01 NOTE — ED Triage Notes (Signed)
Pt presents to [facility] for lip laceration today. Pt's caretakers states she had on wrong shoes and fell and slit lip

## 2019-02-02 NOTE — ED Provider Notes (Signed)
[facility]    CSN: [id number] Arrival date & time: 02/01/19  1722     History   Chief Complaint Chief Complaint  Patient presents with  . Lip Laceration    HPI Kelly [name] is a 38 y.o. female history of ESRD on dialysis, intellectual disability, DM, hypertension presenting today for evaluation of lip laceration.  Patient states that she had on the wrong shoes and she fell and hit her lip on a wood floor.  Denies loss of consciousness.  Denies persistent headaches or vision changes since the incident.  Main concern was that she had a laceration to her lip and given history of diabetes concern for healing and infection.  Denies difficulty swallowing.  Denies any loose teeth.  Denies chest pain or shortness of breath.  During visit guardians requested checking blood sugar as it is typically checked around this time prior to eating.  Occasional issues with her blood sugar dropping.  HPI  Past Medical History:  Diagnosis Date  . Alcohol abuse    chronic  . Bipolar disorder ([place])   . Chronic kidney disease   . Cognitive changes   . Cognitive impairment   . Depression   . Depression   . Diabetes mellitus without complication ([place])   . Elevated lipids   . Hyperparathyroidism ([place])   . Hypertension   . Staph aureus infection    A/V fistula  . UTI (lower urinary tract infection)     Patient Active Problem List   Diagnosis Date Noted  . Influenza A 12/22/2018  . Hypotension 12/22/2018  . Complication from renal dialysis device 11/06/2018  . Multiple fractures of both lower extremities 06/18/2017  . 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 Hyperkalemia 06/25/2016  . Adjustment disorder with mixed disturbance of emotions and conduct   . Diabetes ([place]) 04/23/2015  . Essential hypertension 04/23/2015  . ESRD on dialysis ([place]) 04/23/2015  . Pain of left arm 04/22/2015    Past Surgical History:  Procedure Laterality Date  . A/V SHUNTOGRAM Left 10/07/2018   Procedure: A/V SHUNTOGRAM;  Surgeon: Katha Cabal, MD;  Location: [facility];  Service: Cardiovascular;  Laterality: Left;  . AV FISTULA PLACEMENT    . CHOLECYSTECTOMY    . dialysis perma cath Right    chest  . PERIPHERAL VASCULAR CATHETERIZATION N/A 05/24/2015   Procedure: Dialysis/Perma Catheter Removal;  Surgeon: Katha Cabal, MD;  Location: [facility];  Service: Cardiovascular;  Laterality: N/A;  . PERIPHERAL VASCULAR CATHETERIZATION Left 02/28/2016   Procedure: A/V Shuntogram/Fistulagram;  Surgeon: Katha Cabal, MD;  Location: [facility];  Service: Cardiovascular;  Laterality: Left;  . PERIPHERAL VASCULAR CATHETERIZATION N/A 02/28/2016   Procedure: A/V Shunt Intervention;  Surgeon: Katha Cabal, MD;  Location: [facility];  Service: Cardiovascular;  Laterality: N/A;  . PERIPHERAL VASCULAR CATHETERIZATION N/A 06/26/2016   Procedure: A/V Shuntogram/Fistulagram;  Surgeon: Katha Cabal, MD;  Location: [facility];  Service: Cardiovascular;  Laterality: N/A;  . PERIPHERAL VASCULAR CATHETERIZATION N/A 09/10/2016   Procedure: A/V Shuntogram/Fistulagram;  Surgeon: Algernon Huxley, MD;  Location: [facility];  Service: Cardiovascular;  Laterality: N/A;  . PERIPHERAL VASCULAR CATHETERIZATION Left 09/14/2016   Procedure: Thrombectomy;  Surgeon: Katha Cabal, MD;  Location: [facility];  Service: Cardiovascular;  Laterality: Left;  Marland Kitchen VASCULAR ACCESS DEVICE INSERTION Left 04/22/2015   Procedure: INSERTION OF HERO VASCULAR ACCESS DEVICE;  Surgeon: Katha Cabal, MD;  Location: [facility];  Service: Vascular;  Laterality: Left;    OB History   No obstetric history on file.      Home Medications    Prior to Admission medications   Medication Sig Start Date End Date Taking? Authorizing Provider  albuterol (PROVENTIL HFA;VENTOLIN HFA) 108 (90 Base) MCG/ACT inhaler Inhale 2 puffs into the lungs every 4 (four)  hours as needed for wheezing or shortness of breath.   Yes [provider]  aspirin 81 MG chewable tablet Chew 1 tablet (81 mg total) by mouth daily. 10/02/16  Yes Clapacs, Madie Reno, MD  B Complex-C-Folic Acid (RENA-VITE RX) 1 MG TABS Take 1 tablet by mouth daily. 10/01/16  Yes Clapacs, Madie Reno, MD  calcium acetate (PHOSLO) 667 MG capsule Take 2 capsules (1,334 mg total) by mouth 2 (two) times daily with a meal. 10/01/16  Yes Clapacs, Madie Reno, MD  cetaphil (CETAPHIL) lotion Apply 1 application topically daily.   Yes [provider]  cinacalcet (SENSIPAR) 60 MG tablet Take 1 tablet (60 mg total) by mouth daily. 10/01/16  Yes Clapacs, Madie Reno, MD  divalproex (DEPAKOTE) 500 MG DR tablet Take 2 tablets (1,000 mg total) by mouth at bedtime. Patient taking differently: Take 500-1,000 mg by mouth See admin instructions. Take 500 mg in the morning and 1000 mg at bedtime 10/01/16  Yes Clapacs, Madie Reno, MD  docusate sodium (COLACE) 100 MG capsule Take 1 capsule (100 mg total) by mouth at bedtime. 10/01/16  Yes Clapacs, Madie Reno, MD  escitalopram (LEXAPRO) 10 MG tablet Take 1.5 tablets (15 mg total) by mouth daily. Patient taking differently: Take 10 mg by mouth daily. Take with 5 mg tablet to equal 15 mg daily 10/02/16  Yes Clapacs, Madie Reno, MD  escitalopram (LEXAPRO) 5 MG tablet Take 5 mg by mouth daily. Takes along with 10mg  totally 15mg  daily.   Yes [provider]  fluticasone (FLOVENT HFA) 110 MCG/ACT inhaler Inhale 2 puffs into the lungs 2 (two) times daily.   Yes [provider]  folic acid (FOLVITE) 1 MG tablet Take 2 tablets (2 mg total) by mouth at bedtime. 10/01/16  Yes Clapacs, Madie Reno, MD  insulin glargine (LANTUS) 100 UNIT/ML injection Inject 20 Units into the skin at bedtime.   Yes [provider]  irbesartan (AVAPRO) 300 MG tablet Take 300 mg by mouth at bedtime.   Yes [provider]  metoCLOPramide (REGLAN) 10 MG tablet Take 10 mg by mouth 2 (two) times  daily.    Yes [provider]  metoprolol (LOPRESSOR) 50 MG tablet Take 1 tablet (50 mg total) by mouth 2 (two) times daily. 10/01/16  Yes Clapacs, Madie Reno, MD  midodrine (PROAMATINE) 10 MG tablet Take 10 mg by mouth every Monday, Wednesday, and Friday. Prior to dialysis   Yes [provider]  Multiple Vitamin (THEREMS) TABS Take 1 tablet by mouth daily. 10/01/16  Yes Clapacs, Madie Reno, MD  pantoprazole (PROTONIX) 40 MG tablet Take 40 mg by mouth daily.   Yes [provider]  QUEtiapine (SEROQUEL) 300 MG tablet Take 300 mg by mouth at bedtime.   Yes [provider]  Tiotropium Bromide-Olodaterol (STIOLTO RESPIMAT IN) Inhale 2 puffs into the lungs daily.    Yes [provider]  acetaminophen (TYLENOL) 325 MG tablet Take 2 tablets (650 mg total) by mouth every 6 (six) hours as needed for mild pain, moderate pain or headache. Patient taking differently: Take 650 mg by mouth See admin instructions.  Take 650 mg every Monday, Wednesday, and Friday before dialysis, may take 650 mg every 6 hours as needed for pain or headaches 10/01/16   Clapacs, Madie Reno, MD  amoxicillin (AMOXIL) 400 MG/5ML suspension Take 3.1 mLs (250 mg total) by mouth daily for 7 days. Administer after dialysis on dialysis days 02/01/19 02/08/19  Wieters, Hallie C, PA-C  chlorhexidine (PERIDEX) 0.12 % solution Use as directed 15 mLs in the mouth or throat 2 (two) times daily. Rinse for 1 min then spit 02/01/19   Wieters, Hallie C, PA-C  cloNIDine (CATAPRES) 0.1 MG tablet Take 1 tablet (0.1 mg total) by mouth 2 (two) times daily. Patient not taking: Reported on 01/20/2019 10/01/16   Clapacs, Madie Reno, MD  glucagon (GLUCAGON EMERGENCY) 1 MG injection Inject 1 mg into the vein once as needed (low blood sugar).    [provider]  insulin aspart (NOVOLOG) 100 UNIT/ML injection Inject 0-15 Units into the skin 3 (three) times daily with meals. Per sliding scale     [provider]  lamoTRIgine  (LAMICTAL) 150 MG tablet Take 150 mg by mouth 2 (two) times daily.     [provider]  lidocaine-prilocaine (EMLA) cream Apply 1 application topically 3 (three) times a week. Apply to access site 30 minutes before dialysis. Patient taking differently: Apply 1 application topically every Monday, Wednesday, and Friday. Apply to access site 30 minutes before dialysis.  10/01/16   Clapacs, Madie Reno, MD  medroxyPROGESTERone (DEPO-PROVERA) 150 MG/ML injection Inject 150 mg into the muscle every 3 (three) months.    [provider]  oxyCODONE (OXY IR/ROXICODONE) 5 MG immediate release tablet Take 1 tablet (5 mg total) by mouth every 6 (six) hours as needed for severe pain. 12/26/18   Dustin Flock, MD  paliperidone (INVEGA SUSTENNA) 156 MG/ML SUSY injection Inject 156 mg into the muscle See admin instructions. EVERY 28 DAYS    [provider]  patiromer (VELTASSA) 8.4 g packet Take 1 packet (8.4 g total) by mouth daily. Patient taking differently: Take 8.4 g by mouth daily. Mixed into 1/3 cup of drink 10/01/16   Clapacs, Madie Reno, MD  risperiDONE (RISPERDAL) 0.5 MG tablet Take 1 tablet (0.5 mg total) by mouth 2 (two) times daily. 12/26/18   Dustin Flock, MD  traZODone (DESYREL) 100 MG tablet Take 1 tablet (100 mg total) by mouth at bedtime. 10/01/16   Clapacs, Madie Reno, MD  vitamin B-12 (CYANOCOBALAMIN) 1000 MCG tablet Take 1 tablet (1,000 mcg total) by mouth at bedtime. Patient not taking: Reported on 12/22/2018 10/01/16   Clapacs, Madie Reno, MD    Family History Family History  Family history unknown: Yes    Social History Social History   Tobacco Use  . Smoking status: Current Every Day Smoker    Packs/day: 0.25    Years: 0.00    Pack years: 0.00    Types: Cigarettes  . Smokeless tobacco: Current User  Substance Use Topics  . Alcohol use: No  . Drug use: No     Allergies   Patient has no known allergies.   Review of Systems Review of Systems  Constitutional:  Negative for fatigue and fever.  HENT: Positive for mouth sores. Negative for congestion, sinus pressure and sore throat.   Eyes: Negative for photophobia, pain and visual disturbance.  Respiratory: Negative for cough and shortness of breath.   Cardiovascular: Negative for chest pain.  Gastrointestinal: Negative for abdominal pain, nausea and vomiting.  Genitourinary: Negative for decreased urine  volume and hematuria.  Musculoskeletal: Negative for myalgias, neck pain and neck stiffness.  Skin: Positive for color change and wound.  Neurological: Negative for dizziness, syncope, facial asymmetry, speech difficulty, weakness, light-headedness, numbness and headaches.     Physical Exam Triage Vital Signs ED Triage Vitals  Enc Vitals Group     BP 02/01/19 1802 134/66     Pulse Rate 02/01/19 1802 85     Resp 02/01/19 1802 17     Temp 02/01/19 1802 (!) 97 F (36.1 C)     Temp Source 02/01/19 1802 Oral     SpO2 02/01/19 1802 100 %     Weight --      Height --      Head Circumference --      Peak Flow --      Pain Score 02/01/19 1804 10     Pain Loc --      Pain Edu? --      Excl. in [place]? --    No data found.  Updated Vital Signs BP 134/66 (BP Location: Left Arm)   Pulse 85   Temp (!) 97 F (36.1 C) (Oral)   Resp 17   SpO2 100%   Visual Acuity Right Eye Distance:   Left Eye Distance:   Bilateral Distance:    Right Eye Near:   Left Eye Near:    Bilateral Near:     Physical Exam Vitals signs and nursing note reviewed.  Constitutional:      General: She is not in acute distress.    Appearance: She is well-developed.  HENT:     Head: Normocephalic and atraumatic.     Mouth/Throat:     Comments: Lower lip with wound just inside inner aspect of lip with surrounding bruising and slight purple/erythematous discoloration, tender to touch Wound approximately 2 to 3 mm deep, does not extend to outer surface of lip  Posterior pharynx patent, no loose teeth, no other  lacerations or abrasions inside oral mucosa Eyes:     Conjunctiva/sclera: Conjunctivae normal.     Comments: Bilateral perioral areas with swelling, no erythema-stated this is normal for her  Neck:     Musculoskeletal: Neck supple.  Cardiovascular:     Rate and Rhythm: Normal rate and regular rhythm.     Heart sounds: No murmur.  Pulmonary:     Effort: Pulmonary effort is normal. No respiratory distress.     Breath sounds: Normal breath sounds.     Comments: Breathing comfortably at rest Abdominal:     Palpations: Abdomen is soft.     Tenderness: There is no abdominal tenderness.  Skin:    General: Skin is warm and dry.  Neurological:     Mental Status: She is alert.      UC Treatments / Results  Labs (all labs ordered are listed, but only abnormal results are displayed) Labs Reviewed  GLUCOSE, CAPILLARY - Abnormal; Notable for the following components:      Result Value   Glucose-Capillary 122 (*)    All other components within normal limits  CBG MONITORING, ED    EKG None  Radiology No results found.  Procedures Procedures (including critical care time)  Medications Ordered in UC Medications - No data to display  Initial Impression / Assessment and Plan / UC Course  I have reviewed the triage vital signs and the nursing notes.  Pertinent labs & imaging results that were available during my care of the patient were reviewed by me  and considered in my medical decision making (see chart for details).     Blood sugar 122.  Laceration and lip likely to heal well its own over time.  Will provide chlorhexidine to rinse in order to prevent infection.  Will empirically place on amoxicillin prophylactically given diabetic history.  Based off her ESRD providing to 50 mg daily for 1 week.  Continue to monitor for gradual healing in this area.Discussed strict return precautions. Patient verbalized understanding and is agreeable with plan.  Final Clinical Impressions(s) /  UC Diagnoses   Final diagnoses:  Lip laceration, initial encounter     Discharge Instructions     Please use chlorhexidine mouth rinse twice daily, rinse for approximately 1 minute and then spit out Please also begin taking amoxicillin once daily to help prevent infection, take after dialysis on dialysis days Apply ice to help with swelling Tylenol as needed for pain Please continue monitor for gradual healing   ED Prescriptions    Medication Sig Dispense Auth. Provider   chlorhexidine (PERIDEX) 0.12 % solution  (Status: Discontinued) Use as directed 15 mLs in the mouth or throat 2 (two) times daily. Rinse for 1 min then spit 120 mL Wieters, Hallie C, PA-C   amoxicillin (AMOXIL) 400 MG/5ML suspension  (Status: Discontinued) Take 3.1 mLs (250 mg total) by mouth daily for 7 days. Administer after dialysis on dialysis days 25 mL Wieters, Hallie C, PA-C   amoxicillin (AMOXIL) 400 MG/5ML suspension Take 3.1 mLs (250 mg total) by mouth daily for 7 days. Administer after dialysis on dialysis days 25 mL Wieters, Hallie C, PA-C   chlorhexidine (PERIDEX) 0.12 % solution Use as directed 15 mLs in the mouth or throat 2 (two) times daily. Rinse for 1 min then spit 120 mL Wieters, Hallie C, PA-C     Controlled Substance Prescriptions [place] Controlled Substance Registry consulted? Not Applicable   Janith Lima, [place] 02/02/19 1057

## 2019-02-10 ENCOUNTER — Ambulatory Visit: Payer: Medicaid Other

## 2019-02-25 ENCOUNTER — Ambulatory Visit: Payer: Medicaid Other | Admitting: Podiatry

## 2019-03-10 ENCOUNTER — Ambulatory Visit: Payer: Medicaid Other

## 2019-04-14 ENCOUNTER — Ambulatory Visit: Payer: Medicaid Other

## 2019-04-15 ENCOUNTER — Ambulatory Visit: Payer: Medicaid Other | Admitting: Podiatry

## 2019-04-27 ENCOUNTER — Telehealth: Payer: Self-pay | Admitting: Neurology

## 2019-04-27 NOTE — Telephone Encounter (Signed)
Due to current COVID 19 pandemic, our office is severely reducing in office visits until further notice, in order to minimize the risk to our patients and healthcare providers.   I called patient regarding her new patient appointment. I spoke with pt caregivier, Romulus and scheduled an in office visit for 6/3 at 2:30. I advised that we are taking several precautions to keep patients and staff safe during the pandemic. Caregiver understands that our office is requesting that only one person accompany patient if necessary and that pt and guest wear masks. Both the pt and guest will have temps checked/screened for COVID upon arrival. Caregiver verbalized understanding.

## 2019-04-29 ENCOUNTER — Ambulatory Visit (INDEPENDENT_AMBULATORY_CARE_PROVIDER_SITE_OTHER): Payer: Medicaid Other | Admitting: Neurology

## 2019-04-29 ENCOUNTER — Encounter: Payer: Self-pay | Admitting: Neurology

## 2019-04-29 ENCOUNTER — Other Ambulatory Visit: Payer: Self-pay

## 2019-04-29 VITALS — BP 123/74 | HR 95 | Temp 98.0°F

## 2019-04-29 DIAGNOSIS — G2119 Other drug induced secondary parkinsonism: Secondary | ICD-10-CM | POA: Diagnosis not present

## 2019-04-29 DIAGNOSIS — Z79899 Other long term (current) drug therapy: Secondary | ICD-10-CM

## 2019-04-29 DIAGNOSIS — G3281 Cerebellar ataxia in diseases classified elsewhere: Secondary | ICD-10-CM | POA: Diagnosis not present

## 2019-04-29 DIAGNOSIS — R269 Unspecified abnormalities of gait and mobility: Secondary | ICD-10-CM | POA: Diagnosis not present

## 2019-04-29 NOTE — Progress Notes (Signed)
Subjective:    Patient ID: Kelly [name] is a 38 y.o. female.  HPI     Star Age, MD, PhD [facility] [address] [place], [place] [address]  Dear Kelly [name],   I saw your patient, Kelly [name], upon your kind request in my neurologic clinic today for initial consultation of her balance problem.  As you know, Kelly [name] is a 38 year old right-handed woman with a underlying complex medical history of mood disorder with a diagnosis of bipolar disorder, also schizoaffective disorder by chart review, end-stage renal disease on hemodialysis, history of alcohol abuse by chart review, hypertension, diabetes, cognitive impairment, elevated lipids, hyperparathyroidism, and obesity, who presents for evaluation of her gait disorder. She is accompanied by her group home director, [place].  She also has a legal guardian.  She was hospitalized on 12/22/2018 and discharged on 12/26/2018, she was diagnosed with influenza A.  Romulus reports that since she was discharged from the hospital she has not been able to walk as long a distance as before.  She has had a walker for the past 1+ year.  She was able to walk about a quarter of a mile but now she walks less than that.  She has difficulty standing up from the seated position.  She has a wheelchair.  She reports very little, she is not sure why she is here today, she reports bilateral foot pain.  Per caretaker, she has fractured both feet before.  She is on numerous medications, not clear why she is taking some of them per caretaker.  She is on metoclopramide twice daily, unclear how long she has been on daily standing dose of metoclopramide.  I reviewed your office note from 02/27/2019.  Of note, she is on multiple medications including several potentially sedating medications including Depakote, Lamictal, Lexapro, clonidine, Seroquel, Risperdal, trazodone, and metoprolol.  She had a head CT without contrast on  10/26/2018 after a fall and I reviewed the results: IMPRESSION: Region of low-attenuation over the periphery of the left cerebellar hemisphere likely subacute to chronic ischemic change or possible abnormality related to the transverse/sigmoid sinus. Consider MRI for further evaluation.  She presented to the emergency room on January 27, 2019 with a lip laceration after a fall.  She was treated symptomatically and was given antibiotics. Romulus reports that she also fell in the hospital.  I do not see where she had a recent head CT, she has not had a brain MRI.   Her Past Medical History Is Significant For: Past Medical History:  Diagnosis Date  . Alcohol abuse    chronic  . Bipolar disorder ([place])   . Chronic kidney disease   . Cognitive changes   . Cognitive impairment   . Depression   . Depression   . Diabetes mellitus without complication ([place])   . Elevated lipids   . Hyperparathyroidism ([place])   . Hypertension   . Staph aureus infection    A/V fistula  . UTI (lower urinary tract infection)     Her Past Surgical History Is Significant For: Past Surgical History:  Procedure Laterality Date  . A/V SHUNTOGRAM Left 10/07/2018   Procedure: A/V SHUNTOGRAM;  Surgeon: Katha Cabal, MD;  Location: [place] [facility];  Service: Cardiovascular;  Laterality: Left;  . AV FISTULA PLACEMENT    . CHOLECYSTECTOMY    . dialysis perma cath Right    chest  . PERIPHERAL VASCULAR CATHETERIZATION N/A 05/24/2015   Procedure: Dialysis/Perma  Catheter Removal;  Surgeon: Katha Cabal, MD;  Location: [place] [facility];  Service: Cardiovascular;  Laterality: N/A;  . PERIPHERAL VASCULAR CATHETERIZATION Left 02/28/2016   Procedure: A/V Shuntogram/Fistulagram;  Surgeon: Katha Cabal, MD;  Location: [place] [facility];  Service: Cardiovascular;  Laterality: Left;  . PERIPHERAL VASCULAR CATHETERIZATION N/A 02/28/2016   Procedure: A/V Shunt Intervention;  Surgeon: Katha Cabal, MD;   Location: [place] [facility];  Service: Cardiovascular;  Laterality: N/A;  . PERIPHERAL VASCULAR CATHETERIZATION N/A 06/26/2016   Procedure: A/V Shuntogram/Fistulagram;  Surgeon: Katha Cabal, MD;  Location: [place] [facility];  Service: Cardiovascular;  Laterality: N/A;  . PERIPHERAL VASCULAR CATHETERIZATION N/A 09/10/2016   Procedure: A/V Shuntogram/Fistulagram;  Surgeon: Algernon Huxley, MD;  Location: [place] [facility];  Service: Cardiovascular;  Laterality: N/A;  . PERIPHERAL VASCULAR CATHETERIZATION Left 09/14/2016   Procedure: Thrombectomy;  Surgeon: Katha Cabal, MD;  Location: [place] [facility];  Service: Cardiovascular;  Laterality: Left;  Marland Kitchen VASCULAR ACCESS DEVICE INSERTION Left 04/22/2015   Procedure: INSERTION OF HERO VASCULAR ACCESS DEVICE;  Surgeon: Katha Cabal, MD;  Location: [facility];  Service: Vascular;  Laterality: Left;    Her Family History Is Significant For: Family History  Family history unknown: Yes    Her Social History Is Significant For: Social History   Socioeconomic History  . Marital status: Single    Spouse name: Not on file  . Number of children: Not on file  . Years of education: Not on file  . Highest education level: Not on file  Occupational History  . Not on file  Social Needs  . Financial resource strain: Not on file  . Food insecurity:    Worry: Not on file    Inability: Not on file  . Transportation needs:    Medical: Not on file    Non-medical: Not on file  Tobacco Use  . Smoking status: Current Every Day Smoker    Packs/day: 0.25    Years: 0.00    Pack years: 0.00    Types: Cigarettes  . Smokeless tobacco: Current User  Substance and Sexual Activity  . Alcohol use: No  . Drug use: No  . Sexual activity: Not on file  Lifestyle  . Physical activity:    Days per week: Not on file    Minutes per session: Not on file  . Stress: Not on file  Relationships  . Social connections:    Talks on phone: Not on file     Gets together: Not on file    Attends religious service: Not on file    Active member of club or organization: Not on file    Attends meetings of clubs or organizations: Not on file    Relationship status: Not on file  Other Topics Concern  . Not on file  Social History Narrative  . Not on file    Her Allergies Are:  No Known Allergies:   Her Current Medications Are:  Outpatient Encounter Medications as of 04/29/2019  Medication Sig  . acetaminophen (TYLENOL) 325 MG tablet Take 2 tablets (650 mg total) by mouth every 6 (six) hours as needed for mild pain, moderate pain or headache. (Patient taking differently: Take 650 mg by mouth See admin instructions. Take 650 mg every Monday, Wednesday, and Friday before dialysis, may take 650 mg every 6 hours as needed for pain or headaches)  . albuterol (PROVENTIL HFA;VENTOLIN HFA) 108 (90 Base) MCG/ACT inhaler Inhale  2 puffs into the lungs every 4 (four) hours as needed for wheezing or shortness of breath.  Marland Kitchen aspirin 81 MG chewable tablet Chew 1 tablet (81 mg total) by mouth daily.  . B Complex-C-Folic Acid (RENA-VITE RX) 1 MG TABS Take 1 tablet by mouth daily.  . calcium acetate (PHOSLO) 667 MG capsule Take 2 capsules (1,334 mg total) by mouth 2 (two) times daily with a meal.  . cetaphil (CETAPHIL) lotion Apply 1 application topically daily.  . cetirizine (ZYRTEC) 10 MG tablet Take 10 mg by mouth daily.  . chlorhexidine (PERIDEX) 0.12 % solution Use as directed 15 mLs in the mouth or throat 2 (two) times daily. Rinse for 1 min then spit  . cinacalcet (SENSIPAR) 60 MG tablet Take 1 tablet (60 mg total) by mouth daily.  . cloNIDine (CATAPRES) 0.1 MG tablet Take 1 tablet (0.1 mg total) by mouth 2 (two) times daily.  . divalproex (DEPAKOTE) 500 MG DR tablet Take 2 tablets (1,000 mg total) by mouth at bedtime. (Patient taking differently: Take 500-1,000 mg by mouth See admin instructions. Take 500 mg in the morning and 1000 mg at bedtime)  . docusate  sodium (COLACE) 100 MG capsule Take 1 capsule (100 mg total) by mouth at bedtime.  Marland Kitchen erythromycin ophthalmic ointment 1 application at bedtime.  Marland Kitchen escitalopram (LEXAPRO) 10 MG tablet Take 1.5 tablets (15 mg total) by mouth daily. (Patient taking differently: Take 10 mg by mouth daily. Take with 5 mg tablet to equal 15 mg daily)  . escitalopram (LEXAPRO) 5 MG tablet Take 5 mg by mouth daily. Takes along with 10mg  totally 15mg  daily.  . fluticasone (FLOVENT HFA) 110 MCG/ACT inhaler Inhale 2 puffs into the lungs 2 (two) times daily.  . folic acid (FOLVITE) 1 MG tablet Take 2 tablets (2 mg total) by mouth at bedtime.  Marland Kitchen glucagon (GLUCAGON EMERGENCY) 1 MG injection Inject 1 mg into the vein once as needed (low blood sugar).  . insulin aspart (NOVOLOG) 100 UNIT/ML injection Inject 0-15 Units into the skin 3 (three) times daily with meals. Per sliding scale   . insulin glargine (LANTUS) 100 UNIT/ML injection Inject 20 Units into the skin at bedtime.  . irbesartan (AVAPRO) 300 MG tablet Take 300 mg by mouth at bedtime.  . lamoTRIgine (LAMICTAL) 150 MG tablet Take 150 mg by mouth 2 (two) times daily.   Marland Kitchen lidocaine-prilocaine (EMLA) cream Apply 1 application topically 3 (three) times a week. Apply to access site 30 minutes before dialysis. (Patient taking differently: Apply 1 application topically every Monday, Wednesday, and Friday. Apply to access site 30 minutes before dialysis. )  . medroxyPROGESTERone (DEPO-PROVERA) 150 MG/ML injection Inject 150 mg into the muscle every 3 (three) months.  . methylPREDNISolone acetate (DEPO-MEDROL) 40 MG/ML injection (RADIOLOGY ONLY) 40 mg once.  . metoCLOPramide (REGLAN) 10 MG tablet Take 10 mg by mouth 2 (two) times daily.   . metoprolol (LOPRESSOR) 50 MG tablet Take 1 tablet (50 mg total) by mouth 2 (two) times daily.  . midodrine (PROAMATINE) 10 MG tablet Take 10 mg by mouth every Monday, Wednesday, and Friday. Prior to dialysis  . Multiple Vitamin (THEREMS) TABS  Take 1 tablet by mouth daily.  . paliperidone (INVEGA SUSTENNA) 156 MG/ML SUSY injection Inject 156 mg into the muscle See admin instructions. EVERY 28 DAYS  . pantoprazole (PROTONIX) 40 MG tablet Take 40 mg by mouth daily.  . patiromer (VELTASSA) 8.4 g packet Take 1 packet (8.4 g total) by mouth daily. (Patient  taking differently: Take 8.4 g by mouth daily. Mixed into 1/3 cup of drink)  . PROGESTERONE IM Inject into the muscle.  Marland Kitchen QUEtiapine (SEROQUEL) 300 MG tablet Take 300 mg by mouth at bedtime.  . risperiDONE (RISPERDAL) 0.5 MG tablet Take 1 tablet (0.5 mg total) by mouth 2 (two) times daily.  . Tiotropium Bromide-Olodaterol (STIOLTO RESPIMAT IN) Inhale 2 puffs into the lungs daily.   . traZODone (DESYREL) 100 MG tablet Take 1 tablet (100 mg total) by mouth at bedtime.  . vitamin B-12 (CYANOCOBALAMIN) 1000 MCG tablet Take 1 tablet (1,000 mcg total) by mouth at bedtime.  . [DISCONTINUED] oxyCODONE (OXY IR/ROXICODONE) 5 MG immediate release tablet Take 1 tablet (5 mg total) by mouth every 6 (six) hours as needed for severe pain.   No facility-administered encounter medications on file as of 04/29/2019.   :   Review of Systems:  Out of a complete 14 point review of systems, all are reviewed and negative with the exception of these symptoms as listed below:  Review of Systems  Neurological:       Pt reports that her balance has been off since she was hospitalized for the flu.    Objective:  Neurological Exam  Physical Exam Physical Examination:   Vitals:   04/29/19 1451  BP: 123/74  Pulse: 95  Temp: 98 F (36.7 C)   General Examination: The patient is a very pleasant 38 y.o. female in no acute distress. She appears Frail and deconditioned, she is minimally verbal, she has slowness in responding, she has mild hypophonia.  She is situated in her wheelchair. Adequately groomed. She is unable to give her own history.  HEENT: Normocephalic, atraumatic, pupils are equal, round and  reactive to light and accommodation. Extraocular tracking is slow, she has a slightly slower eye blink rate, very mild facial masking is noted, she has mild hypophonia and some slowness in her speech.  She has fairly good nuchal tone, no lip, neck or jaw tremor, face is symmetric.  Airway examination reveals no significant mouth dryness, tongue protrudes centrally in palate elevates symmetrically.  No tongue tremor, no facial dyskinesias.  Chest: Clear to auscultation without wheezing, rhonchi or crackles noted.  Heart: S1+S2+0, regular But 3/6 systolic murmur noted, per Romulus her murmur is not new.   Abdomen: Soft, non-tender and non-distended with normal bowel sounds appreciated on auscultation.  Extremities: There is trace pitting edema in the distal lower extremities bilaterally, Left more than right. Dialysis access in the left proximal arm, old fistula noted in the right arm.  Skin: Warm and dry without trophic changes noted.  Musculoskeletal: exam reveals no obvious joint deformities, tenderness or joint swelling or erythema.   Neurologically:  Mental status: The patient is awake, Paying good attention but has impaired fund of knowledge, language skills and unable to provide her history.  She is able to follow simple commands with some delay.  There is psychomotor retardation.  She has slowness in responding, she speaks slowly and softly. Cranial nerves II - XII are as described above under HEENT exam. In addition: shoulder shrug is normal with equal shoulder height noted. Motor exam: Normal-appearing bulk, slightly increased tone in both upper extremities, she has a hard time relaxing however.  [place]berg is not testable safely.  Strength globally is about 4 out of 5, reflexes 1+ in the upper extremities, absent in both lower extremities. She is somewhat sensitive to pinprick in the distal lower extremities bilaterally, may have some reduction in  sensation in the distal lower extremities,  difficult to test.  No obvious sensory deficit in the upper extremities She has no resting or postural tremor.  She has on cerebellar testing no obvious dysmetria or intention tremor, heel-to-shin not possible for her Gait, station and balance: She stands with difficulty. She requires multiple attempts, she pushes eventually herself up from the wheelchair without assistance.  She stands wide-based, she walks a Short distances without assistance but has decreased arm swing and does not pick up her feet very well, no telltale shuffling.  She does turn in 3 steps.  Balance is impaired.  Assessment and Plan:  Assessment and Plan:  In summary, Floria Dawnetta Copenhaver is a very pleasant 38 y.o.-year old female with a underlying complex medical history of mood disorder with a diagnosis of bipolar disorder, also schizoaffective disorder by chart review, end-stage renal disease on hemodialysis, history of alcohol abuse by chart review, hypertension, diabetes, cognitive impairment, elevated lipids, hyperparathyroidism, and obesity, who presents for evaluation of her gait disorder. On examination, she has a nonspecific gait and balance disorder, likely secondary to multiple factors.  I am primarily concerned about her polypharmacy, she is on numerous medications unclear why she is on and how long she has been on them.  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  Her caretaker is advised of this.  She does not have any focal neurological findings, no one-sided weakness, mild global weakness noted, mild increase in tone.  Her caretaker reports that she has a tendency to hold her right arm in a flexed position.  We will go ahead with a brain MRI if possible.  He also  reports that she had a fall when she was in the hospital.  Her last head CT was in December which did not show any acute findings.  We will call them with the MRI results.  Unfortunately, there is not a whole lot I can add from my end of things, I would recommend that her medication regimen be streamlined.  She has a walker available in a wheelchair.  Supportive care is recommended. Fall prevention was discussed So long as the brain MRI shows no acute findings we will call them with the test result and I will see her back on an as-needed basis. I answered all their questions today and the patient and her caretaker were in agreement. Thank you very much for allowing me to participate in the care of this nice patient. If I can be of any further assistance to you please do not hesitate to call me at [phone].  Sincerely,   Star Age, MD, PhD

## 2019-04-29 NOTE — Patient Instructions (Signed)
Your gait disorder is likely secondary to multiple factors, my biggest concern is the amount of sedating and psychotropic medications you are on.  Please talk to your provider especially your mental health provider regarding streamlining your medication regimen.  You have some mild signs of Parkinson's-like changes, you are taking several medications that can induce Parkinson's like symptoms called drug-induced parkinsonism.  These medications include Seroquel, Risperdal, metoclopramide. I will order a brain MRI, we will keep you posted regarding the test results.  If the brain MRI shows no acute findings or stable findings I can see you back as needed.

## 2019-04-30 ENCOUNTER — Telehealth: Payer: Self-pay | Admitting: Neurology

## 2019-04-30 NOTE — Telephone Encounter (Signed)
medicaid order sent to GI. They will obtain the auth and reach out to the patient to schedule.  °

## 2019-05-07 ENCOUNTER — Ambulatory Visit: Payer: Medicaid Other

## 2019-05-07 ENCOUNTER — Ambulatory Visit: Payer: Medicaid Other | Admitting: Neurology

## 2019-05-12 ENCOUNTER — Other Ambulatory Visit: Payer: Self-pay

## 2019-05-12 ENCOUNTER — Ambulatory Visit (INDEPENDENT_AMBULATORY_CARE_PROVIDER_SITE_OTHER): Payer: Medicaid Other

## 2019-05-12 ENCOUNTER — Encounter (INDEPENDENT_AMBULATORY_CARE_PROVIDER_SITE_OTHER): Payer: Self-pay | Admitting: Nurse Practitioner

## 2019-05-12 ENCOUNTER — Ambulatory Visit (INDEPENDENT_AMBULATORY_CARE_PROVIDER_SITE_OTHER): Payer: Medicaid Other | Admitting: Nurse Practitioner

## 2019-05-12 VITALS — BP 146/89 | HR 80 | Resp 20 | Ht 61.0 in | Wt 195.0 lb

## 2019-05-12 DIAGNOSIS — N186 End stage renal disease: Secondary | ICD-10-CM

## 2019-05-12 DIAGNOSIS — Z992 Dependence on renal dialysis: Secondary | ICD-10-CM

## 2019-05-12 DIAGNOSIS — F17210 Nicotine dependence, cigarettes, uncomplicated: Secondary | ICD-10-CM

## 2019-05-12 DIAGNOSIS — T829XXS Unspecified complication of cardiac and vascular prosthetic device, implant and graft, sequela: Secondary | ICD-10-CM

## 2019-05-12 DIAGNOSIS — E1122 Type 2 diabetes mellitus with diabetic chronic kidney disease: Secondary | ICD-10-CM | POA: Diagnosis not present

## 2019-05-12 DIAGNOSIS — Z79899 Other long term (current) drug therapy: Secondary | ICD-10-CM

## 2019-05-12 DIAGNOSIS — Z794 Long term (current) use of insulin: Secondary | ICD-10-CM

## 2019-05-12 DIAGNOSIS — I120 Hypertensive chronic kidney disease with stage 5 chronic kidney disease or end stage renal disease: Secondary | ICD-10-CM

## 2019-05-12 DIAGNOSIS — I10 Essential (primary) hypertension: Secondary | ICD-10-CM

## 2019-05-12 NOTE — Progress Notes (Signed)
SUBJECTIVE:  Patient ID: Kelly [name], female    DOB: [birth year], 38 y.o.   MRN: [id number] Chief Complaint  Patient presents with   Follow-up    HPI  Kelly [name] is a 38 y.o. female The patient returns to the office for followup of their dialysis access. The function of the access has been stable. The patient denies increased bleeding time or increased recirculation. Patient denies difficulty with cannulation. The patient denies hand pain or other symptoms consistent with steal phenomena.  No significant arm swelling.  The patient denies redness or swelling at the access site. The patient denies fever or chills at home or while on dialysis.  The patient denies amaurosis fugax or recent TIA symptoms. There are no recent neurological changes noted. The patient denies claudication symptoms or rest pain symptoms. The patient denies history of DVT, PE or superficial thrombophlebitis. The patient denies recent episodes of angina or shortness of breath.   Patient present with guardian.   Duplex ultrasound of the AV access shows a patent access with uniform velocities.  No focal hemodynamically significant stenosis. Flow volume of 2411.      Past Medical History:  Diagnosis Date   Alcohol abuse    chronic   Bipolar disorder ([place])    Chronic kidney disease    Cognitive changes    Cognitive impairment    Depression    Depression    Diabetes mellitus without complication (HCC)    Elevated lipids    Hyperparathyroidism ([place])    Hypertension    Staph aureus infection    A/V fistula   UTI (lower urinary tract infection)     Past Surgical History:  Procedure Laterality Date   A/V SHUNTOGRAM Left 10/07/2018   Procedure: A/V SHUNTOGRAM;  Surgeon: Katha Cabal, MD;  Location: [facility];  Service: Cardiovascular;  Laterality: Left;   AV FISTULA PLACEMENT     CHOLECYSTECTOMY     dialysis perma cath Right    chest   PERIPHERAL VASCULAR  CATHETERIZATION N/A 05/24/2015   Procedure: Dialysis/Perma Catheter Removal;  Surgeon: Katha Cabal, MD;  Location: [facility];  Service: Cardiovascular;  Laterality: N/A;   PERIPHERAL VASCULAR CATHETERIZATION Left 02/28/2016   Procedure: A/V Shuntogram/Fistulagram;  Surgeon: Katha Cabal, MD;  Location: [facility];  Service: Cardiovascular;  Laterality: Left;   PERIPHERAL VASCULAR CATHETERIZATION N/A 02/28/2016   Procedure: A/V Shunt Intervention;  Surgeon: Katha Cabal, MD;  Location: [facility];  Service: Cardiovascular;  Laterality: N/A;   PERIPHERAL VASCULAR CATHETERIZATION N/A 06/26/2016   Procedure: A/V Shuntogram/Fistulagram;  Surgeon: Katha Cabal, MD;  Location: [facility];  Service: Cardiovascular;  Laterality: N/A;   PERIPHERAL VASCULAR CATHETERIZATION N/A 09/10/2016   Procedure: A/V Shuntogram/Fistulagram;  Surgeon: Algernon Huxley, MD;  Location: [facility];  Service: Cardiovascular;  Laterality: N/A;   PERIPHERAL VASCULAR CATHETERIZATION Left 09/14/2016   Procedure: Thrombectomy;  Surgeon: Katha Cabal, MD;  Location: [facility];  Service: Cardiovascular;  Laterality: Left;   VASCULAR ACCESS DEVICE INSERTION Left 04/22/2015   Procedure: INSERTION OF HERO VASCULAR ACCESS DEVICE;  Surgeon: Katha Cabal, MD;  Location: [facility];  Service: Vascular;  Laterality: Left;    Social History   Socioeconomic History   Marital status: Single    Spouse name: Not on file   Number of children: Not on file   Years of education: Not on file   Highest  education level: Not on file  Occupational History   Not on file  Social Needs   Financial resource strain: Not on file   Food insecurity    Worry: Not on file    Inability: Not on file   Transportation needs    Medical: Not on file    Non-medical: Not on file  Tobacco Use   Smoking status: Current Every Day Smoker    Packs/day: 0.25    Years:  0.00    Pack years: 0.00    Types: Cigarettes   Smokeless tobacco: Current User  Substance and Sexual Activity   Alcohol use: No   Drug use: No   Sexual activity: Not on file  Lifestyle   Physical activity    Days per week: Not on file    Minutes per session: Not on file   Stress: Not on file  Relationships   Social connections    Talks on phone: Not on file    Gets together: Not on file    Attends religious service: Not on file    Active member of club or organization: Not on file    Attends meetings of clubs or organizations: Not on file    Relationship status: Not on file   Intimate partner violence    Fear of current or ex partner: Not on file    Emotionally abused: Not on file    Physically abused: Not on file    Forced sexual activity: Not on file  Other Topics Concern   Not on file  Social History Narrative   Not on file    Family History  Family history unknown: Yes    No Known Allergies   Review of Systems   Review of Systems: Negative Unless Checked Constitutional: [] Weight loss  [] Fever  [] Chills Cardiac: [] Chest pain   []  Atrial Fibrillation  [] Palpitations   [] Shortness of breath when laying flat   [] Shortness of breath with exertion. [] Shortness of breath at rest Vascular:  [] Pain in legs with walking   [] Pain in legs with standing [] Pain in legs when laying flat   [] Claudication    [] Pain in feet when laying flat    [] History of DVT   [] Phlebitis   [] Swelling in legs   [] Varicose veins   [] Non-healing ulcers Pulmonary:   [] Uses home oxygen   [] Productive cough   [] Hemoptysis   [] Wheeze  [] COPD   [] Asthma Neurologic:  [] Dizziness   [] Seizures  [] Blackouts [] History of stroke   [] History of TIA  [] Aphasia   [] Temporary Blindness   [] Weakness or numbness in arm   [] Weakness or numbness in leg Musculoskeletal:   [] Joint swelling   [] Joint pain   [] Low back pain  []  History of Knee Replacement [] Arthritis [] back Surgeries  []  Spinal Stenosis      Hematologic:  [] Easy bruising  [] Easy bleeding   [] Hypercoagulable state   [x] Anemic Gastrointestinal:  [] Diarrhea   [] Vomiting  [] Gastroesophageal reflux/heartburn   [] Difficulty swallowing. [] Abdominal pain Genitourinary:  [x] Chronic kidney disease   [] Difficult urination  [] Anuric   [] Blood in urine [] Frequent urination  [] Burning with urination   [] Hematuria Skin:  [] Rashes   [] Ulcers [] Wounds Psychological:  [x] History of anxiety   [x]  History of major depression  [x]  Memory Difficulties      OBJECTIVE:   Physical Exam  BP (!) 146/89 (BP Location: Right Arm)    Pulse 80    Resp 20    Ht 5\' 1"  (1.549 m)  Wt 195 lb (88.5 kg)    BMI 36.84 kg/m   Gen: WD/WN, NAD Head: [place]/AT, No temporalis wasting.  Ear/Nose/Throat: Hearing grossly intact, nares w/o erythema or drainage Eyes: PER, EOMI, sclera nonicteric.  Neck: Supple, no masses.  No JVD.  Pulmonary:  Good air movement, no use of accessory muscles.  Cardiac: RRR Vascular: Good thrill and bruit  Vessel Right Left  Radial Palpable Palpable   Gastrointestinal: soft, non-distended. No guarding/no peritoneal signs.  Musculoskeletal: M/S 5/5 throughout.  No deformity or atrophy.  Neurologic: Pain and light touch intact in extremities.  Symmetrical.  Speech is fluent. Motor exam as listed above. Psychiatric: Judgment intact, Mood & affect appropriate for pt's clinical situation. Dermatologic: No Venous rashes. No Ulcers Noted.  No changes consistent with cellulitis. Lymph : No Cervical lymphadenopathy, no lichenification or skin changes of chronic lymphedema.       ASSESSMENT AND PLAN:  1. ESRD on dialysis [facility]) Recommend:  The patient is doing well and currently has adequate dialysis access. The patient's dialysis center is not reporting any access issues. Flow pattern is stable when compared to the prior ultrasound.  The patient should have a duplex ultrasound of the dialysis access in 6 months. The patient will follow-up  with me in the office after each ultrasound    - VAS [place] (AVF, AVG); Future  2. Type 2 diabetes mellitus with chronic kidney disease on chronic dialysis, without long-term current use of insulin (HCC) Continue hypoglycemic medications as already ordered, these medications have been reviewed and there are no changes at this time.  Hgb A1C to be monitored as already arranged by primary service   3. Essential hypertension Continue antihypertensive medications as already ordered, these medications have been reviewed and there are no changes at this time.    Current Outpatient Medications on File Prior to Visit  Medication Sig Dispense Refill   acetaminophen (TYLENOL) 325 MG tablet Take 2 tablets (650 mg total) by mouth every 6 (six) hours as needed for mild pain, moderate pain or headache. (Patient taking differently: Take 650 mg by mouth See admin instructions. Take 650 mg every Monday, Wednesday, and Friday before dialysis, may take 650 mg every 6 hours as needed for pain or headaches) 30 tablet 0   albuterol (PROVENTIL HFA;VENTOLIN HFA) 108 (90 Base) MCG/ACT inhaler Inhale 2 puffs into the lungs every 4 (four) hours as needed for wheezing or shortness of breath.     aspirin 81 MG chewable tablet Chew 1 tablet (81 mg total) by mouth daily. 30 tablet 0   B Complex-C-Folic Acid (RENA-VITE RX) 1 MG TABS Take 1 tablet by mouth daily. 30 tablet 0   calcium acetate (PHOSLO) 667 MG capsule Take 2 capsules (1,334 mg total) by mouth 2 (two) times daily with a meal. 60 capsule 0   cetaphil (CETAPHIL) lotion Apply 1 application topically daily.     cetirizine (ZYRTEC) 10 MG tablet Take 10 mg by mouth daily.     chlorhexidine (PERIDEX) 0.12 % solution Use as directed 15 mLs in the mouth or throat 2 (two) times daily. Rinse for 1 min then spit 120 mL 0   cinacalcet (SENSIPAR) 60 MG tablet Take 1 tablet (60 mg total) by mouth daily. 60 tablet 0   cloNIDine (CATAPRES) 0.1 MG  tablet Take 1 tablet (0.1 mg total) by mouth 2 (two) times daily. 60 tablet 0   divalproex (DEPAKOTE) 500 MG DR tablet Take 2 tablets (1,000 mg total) by  mouth at bedtime. (Patient taking differently: Take 500-1,000 mg by mouth See admin instructions. Take 500 mg in the morning and 1000 mg at bedtime) 60 tablet 0   docusate sodium (COLACE) 100 MG capsule Take 1 capsule (100 mg total) by mouth at bedtime. 30 capsule 0   erythromycin ophthalmic ointment 1 application at bedtime.     escitalopram (LEXAPRO) 10 MG tablet Take 1.5 tablets (15 mg total) by mouth daily. (Patient taking differently: Take 10 mg by mouth daily. Take with 5 mg tablet to equal 15 mg daily) 45 tablet 1   escitalopram (LEXAPRO) 5 MG tablet Take 5 mg by mouth daily. Takes along with 10mg  totally 15mg  daily.     fluticasone (FLOVENT HFA) 110 MCG/ACT inhaler Inhale 2 puffs into the lungs 2 (two) times daily.     folic acid (FOLVITE) 1 MG tablet Take 2 tablets (2 mg total) by mouth at bedtime. 30 tablet 0   glucagon (GLUCAGON EMERGENCY) 1 MG injection Inject 1 mg into the vein once as needed (low blood sugar).     insulin aspart (NOVOLOG) 100 UNIT/ML injection Inject 0-15 Units into the skin 3 (three) times daily with meals. Per sliding scale      insulin glargine (LANTUS) 100 UNIT/ML injection Inject 20 Units into the skin at bedtime.     irbesartan (AVAPRO) 300 MG tablet Take 300 mg by mouth at bedtime.     lamoTRIgine (LAMICTAL) 150 MG tablet Take 150 mg by mouth 2 (two) times daily.      lidocaine-prilocaine (EMLA) cream Apply 1 application topically 3 (three) times a week. Apply to access site 30 minutes before dialysis. (Patient taking differently: Apply 1 application topically every Monday, Wednesday, and Friday. Apply to access site 30 minutes before dialysis. ) 30 g 0   medroxyPROGESTERone (DEPO-PROVERA) 150 MG/ML injection Inject 150 mg into the muscle every 3 (three) months.     methylPREDNISolone acetate  (DEPO-MEDROL) 40 MG/ML injection (RADIOLOGY ONLY) 40 mg once.     metoCLOPramide (REGLAN) 10 MG tablet Take 10 mg by mouth 2 (two) times daily.      metoprolol (LOPRESSOR) 50 MG tablet Take 1 tablet (50 mg total) by mouth 2 (two) times daily. 60 tablet 1   midodrine (PROAMATINE) 10 MG tablet Take 10 mg by mouth every Monday, Wednesday, and Friday. Prior to dialysis     Multiple Vitamin (THEREMS) TABS Take 1 tablet by mouth daily. 30 tablet 0   paliperidone (INVEGA SUSTENNA) 156 MG/ML SUSY injection Inject 156 mg into the muscle See admin instructions. EVERY 28 DAYS     pantoprazole (PROTONIX) 40 MG tablet Take 40 mg by mouth daily.     patiromer (VELTASSA) 8.4 g packet Take 1 packet (8.4 g total) by mouth daily. (Patient taking differently: Take 8.4 g by mouth daily. Mixed into 1/3 cup of drink) 30 packet 0   PROGESTERONE IM Inject into the muscle.     QUEtiapine (SEROQUEL) 300 MG tablet Take 300 mg by mouth at bedtime.     risperiDONE (RISPERDAL) 0.5 MG tablet Take 1 tablet (0.5 mg total) by mouth 2 (two) times daily. 10 tablet 0   Tiotropium Bromide-Olodaterol (STIOLTO RESPIMAT IN) Inhale 2 puffs into the lungs daily.      traZODone (DESYREL) 100 MG tablet Take 1 tablet (100 mg total) by mouth at bedtime. 30 tablet 0   vitamin B-12 (CYANOCOBALAMIN) 1000 MCG tablet Take 1 tablet (1,000 mcg total) by mouth at bedtime. 30 tablet 0  No current facility-administered medications on file prior to visit.     There are no Patient Instructions on file for this visit. No follow-ups on file.   Kris Hartmann, NP  This note was completed with Sales executive.  Any errors are purely unintentional.

## 2019-05-20 ENCOUNTER — Emergency Department (HOSPITAL_COMMUNITY)
Admission: EM | Admit: 2019-05-20 | Discharge: 2019-05-21 | Disposition: A | Discharge status: Home / Self Care | Payer: Medicaid Other | Attending: Emergency Medicine | Admitting: Emergency Medicine

## 2019-05-20 ENCOUNTER — Emergency Department (HOSPITAL_COMMUNITY): Payer: Medicaid Other

## 2019-05-20 ENCOUNTER — Ambulatory Visit (HOSPITAL_BASED_OUTPATIENT_CLINIC_OR_DEPARTMENT_OTHER): Payer: Medicaid Other

## 2019-05-20 ENCOUNTER — Other Ambulatory Visit: Payer: Self-pay

## 2019-05-20 DIAGNOSIS — Y9389 Activity, other specified: Secondary | ICD-10-CM | POA: Insufficient documentation

## 2019-05-20 DIAGNOSIS — Z79899 Other long term (current) drug therapy: Secondary | ICD-10-CM | POA: Diagnosis not present

## 2019-05-20 DIAGNOSIS — T148XXA Other injury of unspecified body region, initial encounter: Secondary | ICD-10-CM

## 2019-05-20 DIAGNOSIS — R419 Unspecified symptoms and signs involving cognitive functions and awareness: Secondary | ICD-10-CM | POA: Diagnosis not present

## 2019-05-20 DIAGNOSIS — E1122 Type 2 diabetes mellitus with diabetic chronic kidney disease: Secondary | ICD-10-CM | POA: Insufficient documentation

## 2019-05-20 DIAGNOSIS — I120 Hypertensive chronic kidney disease with stage 5 chronic kidney disease or end stage renal disease: Secondary | ICD-10-CM | POA: Diagnosis not present

## 2019-05-20 DIAGNOSIS — Z794 Long term (current) use of insulin: Secondary | ICD-10-CM | POA: Diagnosis not present

## 2019-05-20 DIAGNOSIS — Z992 Dependence on renal dialysis: Secondary | ICD-10-CM | POA: Diagnosis not present

## 2019-05-20 DIAGNOSIS — N186 End stage renal disease: Secondary | ICD-10-CM | POA: Diagnosis not present

## 2019-05-20 DIAGNOSIS — S82291A Other fracture of shaft of right tibia, initial encounter for closed fracture: Secondary | ICD-10-CM | POA: Diagnosis not present

## 2019-05-20 DIAGNOSIS — Y9289 Other specified places as the place of occurrence of the external cause: Secondary | ICD-10-CM | POA: Insufficient documentation

## 2019-05-20 DIAGNOSIS — F17210 Nicotine dependence, cigarettes, uncomplicated: Secondary | ICD-10-CM | POA: Insufficient documentation

## 2019-05-20 DIAGNOSIS — M7989 Other specified soft tissue disorders: Secondary | ICD-10-CM

## 2019-05-20 DIAGNOSIS — Y999 Unspecified external cause status: Secondary | ICD-10-CM | POA: Insufficient documentation

## 2019-05-20 DIAGNOSIS — W010XXA Fall on same level from slipping, tripping and stumbling without subsequent striking against object, initial encounter: Secondary | ICD-10-CM | POA: Insufficient documentation

## 2019-05-20 DIAGNOSIS — S99911A Unspecified injury of right ankle, initial encounter: Secondary | ICD-10-CM | POA: Diagnosis present

## 2019-05-20 DIAGNOSIS — R52 Pain, unspecified: Secondary | ICD-10-CM | POA: Diagnosis not present

## 2019-05-20 DIAGNOSIS — S82231A Displaced oblique fracture of shaft of right tibia, initial encounter for closed fracture: Secondary | ICD-10-CM

## 2019-05-20 NOTE — ED Triage Notes (Signed)
Pt here from dialysis with family with c/o right lower leg swelling , pt has a boot on that leg , pt is very sleepy in triage but family states that is her norm after her "mental health" shot today along with pain meds that family gave prior to arrival

## 2019-05-20 NOTE — ED Notes (Signed)
ED Provider at bedside. 

## 2019-05-20 NOTE — Progress Notes (Signed)
Right lower extremity venous duplex completed. Preliminary results in Chart review CV Proc. [facility], [place] 05/20/2019 7:03 PM

## 2019-05-20 NOTE — ED Notes (Signed)
Paged Kelly [name]

## 2019-05-20 NOTE — Progress Notes (Signed)
Pt's health caregiver met me outside of pt's room and said he was leaving and left the following information to be contacted: Mr. Beaulah Dinning [phone] Pt's name is Kelly [name] - ED [facility] Zone Room 34 Please page if you need additional information.   05/20/19 2200  Clinical Encounter Type  Visited With Family

## 2019-05-21 MED ORDER — OXYCODONE-ACETAMINOPHEN 5-325 MG PO TABS: 1.0000 | ORAL_TABLET | Freq: Four times a day (QID) | ORAL | 0 refills | Status: DC | PRN

## 2019-05-21 NOTE — Discharge Instructions (Addendum)
You were seen in the ER for right ankle and leg pain.  X-ray showed a fracture of your tibia.  The warmth and swelling and blister likely from inflammatory changes and probably from rubbing skin on the boot.  We will treat your fracture with a short leg splint.  Elevate your leg.  Take 1000 mg of Tylenol every 6 hours for the pain.  Do not exceed 4,000 mg acetaminophen in a 24 hour period.  Do not take acetaminophen if you have liver disease. For break through and/or severe pain despite acetaminophen regimen, take 5 mg oxycodone every 4 hours.  Oxycodone is a narcotic pain medication that has risk of overdose, death, dependence and abuse. Mild and expected side effects include nausea, stomach upset, drowsiness, constipation. Do not consume alcohol, drive or use heavy machinery while taking this medication. Do not leave unattended around children. Flush any remaining pills that you do not use and do not share.  The emergency department has a strict policy regarding prescription of narcotic medications. We prescribe a short course for acute, new pain or injuries. We are unable to refill this medication in the emergency department for chronic pain or repeatedly.  Refill need to be done by specialist or primary care provider or pain clinic.  Contact your primary care provider or specialist for chronic pain management and refill on narcotic medications.

## 2019-05-21 NOTE — ED Notes (Signed)
Called family member to come and pick pt up.

## 2019-05-21 NOTE — ED Provider Notes (Signed)
[facility] EMERGENCY DEPARTMENT Provider Note   CSN: [id number] Arrival date & time: 05/20/19  1602    History   Chief Complaint Chief Complaint  Patient presents with   Leg Swelling    HPI Chastin Garlitz is a 38 y.o. female with history of EtOH abuse, bipolar disorder, ESRD on HD MWF,  cognitive impairment who lives in a group home, DM on insulin, HTN, hyperparathyroidism, keloids is here for evaluation of right ankle pain onset on Monday.  Level 5 caveat: Cognitive impairment.  Caregiver Romulus provides all the history, he is at bedside.  Pt reportedly fell asleep during dialysis on Monday, when she woke up she stood up quickly and stretched out and apparently tripped and fell to the ground.  At that time she complained of right ankle pain.  HD RN and tech looked at the foot and helped her to a wheelchair.  Since then she has been continuing to complain of right ankle pain.  She doesn't want to put weight on it. There is associated swelling, tightness to the ankle.  Caregiver put the ankle in a cam walker to help with the pain but then patient stated that this was hurting her more.  A blister to the left medial ankle was noted today.  No other interventions.  Patient denies alleviating factors.  No modifying factors.  She denies distal tingling or numbness.  No calf pain or swelling.  No associated chest pain or shortness of breath.  Her last dialysis session was today.  No previous history of surgeries to this extremity.     HPI  Past Medical History:  Diagnosis Date   Alcohol abuse    chronic   Bipolar disorder ([place])    Chronic kidney disease    Cognitive changes    Cognitive impairment    Depression    Depression    Diabetes mellitus without complication (HCC)    Elevated lipids    Hyperparathyroidism ([place])    Hypertension    Staph aureus infection    A/V fistula   UTI (lower urinary tract infection)     Patient Active Problem List   Diagnosis Date Noted   Influenza A 12/22/2018   Hypotension 08/18/3006   Complication from renal dialysis device 11/06/2018   Multiple fractures of both lower extremities 06/18/2017   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   Hyperkalemia 06/25/2016   Adjustment disorder with mixed disturbance of emotions and conduct    Diabetes ([place]) 04/23/2015   Essential hypertension 04/23/2015   ESRD on dialysis ([place]) 04/23/2015   Pain of left arm 04/22/2015    Past Surgical History:  Procedure Laterality Date   A/V SHUNTOGRAM Left 10/07/2018   Procedure: A/V SHUNTOGRAM;  Surgeon: Katha Cabal, MD;  Location: [facility];  Service: Cardiovascular;  Laterality: Left;   AV FISTULA PLACEMENT     CHOLECYSTECTOMY     dialysis perma cath Right    chest   PERIPHERAL VASCULAR CATHETERIZATION N/A 05/24/2015   Procedure: Dialysis/Perma Catheter Removal;  Surgeon: Katha Cabal, MD;  Location: [facility];  Service: Cardiovascular;  Laterality: N/A;   PERIPHERAL VASCULAR CATHETERIZATION Left 02/28/2016   Procedure: A/V Shuntogram/Fistulagram;  Surgeon: Katha Cabal, MD;  Location: [facility];  Service: Cardiovascular;  Laterality: Left;   PERIPHERAL VASCULAR CATHETERIZATION N/A 02/28/2016   Procedure: A/V Shunt Intervention;  Surgeon: Katha Cabal, MD;  Location: [facility];  Service: Cardiovascular;  Laterality: N/A;  PERIPHERAL VASCULAR CATHETERIZATION N/A 06/26/2016   Procedure: A/V Shuntogram/Fistulagram;  Surgeon: Katha Cabal, MD;  Location: [facility];  Service: Cardiovascular;  Laterality: N/A;   PERIPHERAL VASCULAR CATHETERIZATION N/A 09/10/2016   Procedure: A/V Shuntogram/Fistulagram;  Surgeon: Algernon Huxley, MD;  Location: [facility];  Service: Cardiovascular;  Laterality: N/A;   PERIPHERAL VASCULAR CATHETERIZATION Left 09/14/2016   Procedure: Thrombectomy;  Surgeon: Katha Cabal, MD;  Location:  [facility];  Service: Cardiovascular;  Laterality: Left;   VASCULAR ACCESS DEVICE INSERTION Left 04/22/2015   Procedure: INSERTION OF HERO VASCULAR ACCESS DEVICE;  Surgeon: Katha Cabal, MD;  Location: [facility];  Service: Vascular;  Laterality: Left;     OB History   No obstetric history on file.      Home Medications    Prior to Admission medications   Medication Sig Start Date End Date Taking? Authorizing Provider  acetaminophen (TYLENOL) 325 MG tablet Take 2 tablets (650 mg total) by mouth every 6 (six) hours as needed for mild pain, moderate pain or headache. Patient taking differently: Take 650 mg by mouth See admin instructions. Take 650 mg every Monday, Wednesday, and Friday before dialysis, may take 650 mg every 6 hours as needed for pain or headaches 10/01/16   Clapacs, Madie Reno, MD  albuterol (PROVENTIL HFA;VENTOLIN HFA) 108 (90 Base) MCG/ACT inhaler Inhale 2 puffs into the lungs every 4 (four) hours as needed for wheezing or shortness of breath.    [provider]  aspirin 81 MG chewable tablet Chew 1 tablet (81 mg total) by mouth daily. 10/02/16   Clapacs, Madie Reno, MD  B Complex-C-Folic Acid (RENA-VITE RX) 1 MG TABS Take 1 tablet by mouth daily. 10/01/16   Clapacs, Madie Reno, MD  calcium acetate (PHOSLO) 667 MG capsule Take 2 capsules (1,334 mg total) by mouth 2 (two) times daily with a meal. 10/01/16   Clapacs, Madie Reno, MD  cetaphil (CETAPHIL) lotion Apply 1 application topically daily.    [provider]  cetirizine (ZYRTEC) 10 MG tablet Take 10 mg by mouth daily.    [provider]  chlorhexidine (PERIDEX) 0.12 % solution Use as directed 15 mLs in the mouth or throat 2 (two) times daily. Rinse for 1 min then spit 02/01/19   Wieters, [employer] C, PA-C  cinacalcet (SENSIPAR) 60 MG tablet Take 1 tablet (60 mg total) by mouth daily. 10/01/16   Clapacs, Madie Reno, MD  cloNIDine (CATAPRES) 0.1 MG tablet Take 1 tablet (0.1 mg total) by mouth 2 (two) times daily.  10/01/16   Clapacs, Madie Reno, MD  divalproex (DEPAKOTE) 500 MG DR tablet Take 2 tablets (1,000 mg total) by mouth at bedtime. Patient taking differently: Take 500-1,000 mg by mouth See admin instructions. Take 500 mg in the morning and 1000 mg at bedtime 10/01/16   Clapacs, Madie Reno, MD  docusate sodium (COLACE) 100 MG capsule Take 1 capsule (100 mg total) by mouth at bedtime. 10/01/16   Clapacs, Madie Reno, MD  erythromycin ophthalmic ointment 1 application at bedtime.    [provider]  escitalopram (LEXAPRO) 10 MG tablet Take 1.5 tablets (15 mg total) by mouth daily. Patient taking differently: Take 10 mg by mouth daily. Take with 5 mg tablet to equal 15 mg daily 10/02/16   Clapacs, Madie Reno, MD  escitalopram (LEXAPRO) 5 MG tablet Take 5 mg by mouth daily. Takes along with 10mg  totally 15mg  daily.    [provider]  fluticasone (  FLOVENT HFA) 110 MCG/ACT inhaler Inhale 2 puffs into the lungs 2 (two) times daily.    [provider]  folic acid (FOLVITE) 1 MG tablet Take 2 tablets (2 mg total) by mouth at bedtime. 10/01/16   Clapacs, Madie Reno, MD  glucagon (GLUCAGON EMERGENCY) 1 MG injection Inject 1 mg into the vein once as needed (low blood sugar).    [provider]  insulin aspart (NOVOLOG) 100 UNIT/ML injection Inject 0-15 Units into the skin 3 (three) times daily with meals. Per sliding scale     [provider]  insulin glargine (LANTUS) 100 UNIT/ML injection Inject 20 Units into the skin at bedtime.    [provider]  irbesartan (AVAPRO) 300 MG tablet Take 300 mg by mouth at bedtime.    [provider]  lamoTRIgine (LAMICTAL) 150 MG tablet Take 150 mg by mouth 2 (two) times daily.     [provider]  lidocaine-prilocaine (EMLA) cream Apply 1 application topically 3 (three) times a week. Apply to access site 30 minutes before dialysis. Patient taking differently: Apply 1 application topically every Monday, Wednesday, and Friday. Apply  to access site 30 minutes before dialysis.  10/01/16   Clapacs, Madie Reno, MD  medroxyPROGESTERone (DEPO-PROVERA) 150 MG/ML injection Inject 150 mg into the muscle every 3 (three) months.    [provider]  methylPREDNISolone acetate (DEPO-MEDROL) 40 MG/ML injection (RADIOLOGY ONLY) 40 mg once.    [provider]  metoCLOPramide (REGLAN) 10 MG tablet Take 10 mg by mouth 2 (two) times daily.     [provider]  metoprolol (LOPRESSOR) 50 MG tablet Take 1 tablet (50 mg total) by mouth 2 (two) times daily. 10/01/16   Clapacs, Madie Reno, MD  midodrine (PROAMATINE) 10 MG tablet Take 10 mg by mouth every Monday, Wednesday, and Friday. Prior to dialysis    [provider]  Multiple Vitamin (THEREMS) TABS Take 1 tablet by mouth daily. 10/01/16   Clapacs, Madie Reno, MD  oxyCODONE-acetaminophen (PERCOCET/ROXICET) 5-325 MG tablet Take 1 tablet by mouth every 6 (six) hours as needed for severe pain. 05/21/19   Horton, Barbette Hair, MD  paliperidone (INVEGA SUSTENNA) 156 MG/ML SUSY injection Inject 156 mg into the muscle See admin instructions. EVERY 28 DAYS    [provider]  pantoprazole (PROTONIX) 40 MG tablet Take 40 mg by mouth daily.    [provider]  patiromer (VELTASSA) 8.4 g packet Take 1 packet (8.4 g total) by mouth daily. Patient taking differently: Take 8.4 g by mouth daily. Mixed into 1/3 cup of drink 10/01/16   Clapacs, Madie Reno, MD  PROGESTERONE IM Inject into the muscle.    [provider]  QUEtiapine (SEROQUEL) 300 MG tablet Take 300 mg by mouth at bedtime.    [provider]  risperiDONE (RISPERDAL) 0.5 MG tablet Take 1 tablet (0.5 mg total) by mouth 2 (two) times daily. 12/26/18   Dustin Flock, MD  Tiotropium Bromide-Olodaterol (STIOLTO RESPIMAT IN) Inhale 2 puffs into the lungs daily.     [provider]  traZODone (DESYREL) 100 MG tablet Take 1 tablet (100 mg total) by mouth at bedtime. 10/01/16   Clapacs, Madie Reno, MD    vitamin B-12 (CYANOCOBALAMIN) 1000 MCG tablet Take 1 tablet (1,000 mcg total) by mouth at bedtime. 10/01/16   Clapacs, Madie Reno, MD    Family History Family History  Family history unknown: Yes    Social History Social History   Tobacco Use   Smoking  status: Current Every Day Smoker    Packs/day: 0.25    Years: 0.00    Pack years: 0.00    Types: Cigarettes   Smokeless tobacco: Current User  Substance Use Topics   Alcohol use: No   Drug use: No     Allergies   Patient has no known allergies.   Review of Systems Review of Systems  Musculoskeletal: Positive for arthralgias, gait problem and joint swelling.  Skin:       Blister  All other systems reviewed and are negative.    Physical Exam Updated Vital Signs BP (!) 117/56 (BP Location: Right Arm)    Pulse 97    Temp 99.2 F (37.3 C) (Oral)    Resp 16    SpO2 98%   Physical Exam Vitals signs and nursing note reviewed.  Constitutional:      General: She is not in acute distress.    Appearance: She is well-developed.     Comments: NAD.  HENT:     Head: Normocephalic and atraumatic.     Right Ear: External ear normal.     Left Ear: External ear normal.     Nose: Nose normal.  Eyes:     Conjunctiva/sclera: Conjunctivae normal.  Neck:     Musculoskeletal: Normal range of motion and neck supple.  Cardiovascular:     Rate and Rhythm: Normal rate and regular rhythm.     Heart sounds: Normal heart sounds.     Comments: Focal edema to the right ankle. No calf edema or tenderness. R foot and toes well perfused  Pulmonary:     Effort: Pulmonary effort is normal.     Breath sounds: Normal breath sounds.  Musculoskeletal: Normal range of motion.        General: Swelling and tenderness present.     Comments: Focal edema to right distal tib/tib and medial ankle.  TTP diffusely to medial ankle and distal anterior tibia. Foot is held in external rotation. ROM not tested due to pain and concern for underlying fracture.  No focal tenderness to achilles, calcaneous, MTPs or toes. No proximal tib/tib tenderness. Negative squeeze test. Bony prominences of right knee non tender w/o edema.   Skin:    General: Skin is warm and dry.     Capillary Refill: Capillary refill takes less than 2 seconds.     Comments: approx quarter sized exquisitely tender blister to the medial left ankle.  Skin is mildly warm around medial ankle. No erythema. Old scars to proximal tib, non tender.  Neurological:     Mental Status: She is alert and oriented to person, place, and time.     Comments: Sensation to light touch intact in right foot. Strength intact in right toes. Strength at right ankle joint not tested due to pain and concern for injury  Psychiatric:        Behavior: Behavior normal.        Thought Content: Thought content normal.        Judgment: Judgment normal.      ED Treatments / Results  Labs (all labs ordered are listed, but only abnormal results are displayed) Labs Reviewed - No data to display  EKG None  Radiology Dg Ankle Complete Right  Result Date: 05/20/2019 CLINICAL DATA:  Initial evaluation for acute pain status post recent fall. EXAM: RIGHT ANKLE - COMPLETE 3+ VIEW COMPARISON:  None. FINDINGS: There is an acute oblique comminuted fracture of the distal right tibial shaft with approximate 5  mm of lateral displacement. Overlying soft tissue injury, which could reflect an open fracture, although no soft tissue emphysema identified. No definite radiopaque foreign body. Adjacent fibula intact. Ankle mortise remains approximated. Osteoarthritic changes noted about the ankle. Advanced atherosclerotic change for patient age. IMPRESSION: 1. Acute oblique comminuted open fracture of the distal right tibial shaft with approximate 5 mm of lateral displacement. 2. Overlying soft tissue injury, suggesting that this could potentially be an open fracture. Correlation with physical exam recommended. Electronically Signed    By: Jeannine Boga M.D.   On: 05/20/2019 22:08   Dg Foot Complete Right  Result Date: 05/20/2019 CLINICAL DATA:  Initial evaluation for acute pain, recent fall. EXAM: RIGHT FOOT COMPLETE - 3+ VIEW COMPARISON:  Prior radiograph from 09/10/2017. FINDINGS: Comminuted and mildly displaced fracture of the distal right tibial shaft, better evaluated on concomitant ankle radiograph. Subtle linear lucencies with sclerotic margins extends through the bases of the right third and fourth metatarsals, and possibly fifth metatarsal as well, suggesting possible stress fractures. Findings are age indeterminate, but suspected to at least be subacute in nature. No other acute fracture or dislocation. Scattered osteoarthritic changes about the foot, most notable at the first MTP joint. Advanced osteopenia for age. Advanced atherosclerotic change for age noted as well. Diffuse soft tissue swelling about the foot. IMPRESSION: 1. Comminuted and mildly displaced fracture of the distal right tibial shaft, better evaluated on concomitant ankle radiograph. 2. Subtle linear lucencies with sclerotic margins extending through the bases of the right third and fourth metatarsals, and possibly fifth metatarsal, suggesting possible stress fractures. Findings are age indeterminate, but suspected to at least be subacute in nature. Correlation with physical exam recommended. 3. Diffuse soft tissue swelling about the foot. 4. Advanced atherosclerosis for age. Electronically Signed   By: Jeannine Boga M.D.   On: 05/20/2019 22:16   Vas [place] Lower Extremity Venous (dvt) (only [facility])  Result Date: 05/20/2019  Lower Venous Study Indications: Pain, and Swelling.  Comparison Study: No comparison available Performing Technologist: Toma Copier RVS  Examination Guidelines: A complete evaluation includes B-mode imaging, spectral Doppler, color Doppler, and power Doppler as needed of all accessible portions of each vessel. Bilateral  testing is considered an integral part of a complete examination. Limited examinations for reoccurring indications may be performed as noted.  +---------+---------------+---------+-----------+----------+-------+  RIGHT     Compressibility Phasicity Spontaneity Properties Summary  +---------+---------------+---------+-----------+----------+-------+  CFV       Full            Yes       Yes                             +---------+---------------+---------+-----------+----------+-------+  SFJ       Full                                                      +---------+---------------+---------+-----------+----------+-------+  FV Prox   Full            Yes       Yes                             +---------+---------------+---------+-----------+----------+-------+  FV Mid    Full                                                      +---------+---------------+---------+-----------+----------+-------+  FV Distal Full            Yes       Yes                             +---------+---------------+---------+-----------+----------+-------+  PFV       Full            Yes       Yes                             +---------+---------------+---------+-----------+----------+-------+  POP       Full            Yes       Yes                             +---------+---------------+---------+-----------+----------+-------+  PTV       Full                                                      +---------+---------------+---------+-----------+----------+-------+  PERO      Full                                                      +---------+---------------+---------+-----------+----------+-------+   +----+---------------+---------+-----------+----------+-------+  LEFT Compressibility Phasicity Spontaneity Properties Summary  +----+---------------+---------+-----------+----------+-------+  CFV  Full            Yes       Yes                             +----+---------------+---------+-----------+----------+-------+  SFJ  Full                                                       +----+---------------+---------+-----------+----------+-------+     Summary: Right: There is no evidence of deep vein thrombosis in the lower extremity. No cystic structure found in the popliteal fossa. Left: No evidence of a common femoral vein obstruction  *See table(s) above for measurements and observations.    Preliminary     Procedures Procedures (including critical care time)  Medications Ordered in ED Medications - No data to display   Initial Impression / Assessment and Plan / ED Course  I have reviewed the triage vital signs and the nursing notes.  Pertinent labs & imaging results that were available during my care of the patient were reviewed by me and considered in my medical decision making (see chart for details).  Clinical Course as of May 20 1317  Wed May 20, 2019  2124 Romuous care provider and lives with him    [CG]  2255 IMPRESSION: 1. Acute oblique comminuted open fracture of the distal right tibial shaft with approximate 5 mm of lateral displacement. 2. Overlying soft tissue injury, suggesting that this could potentially be an open fracture. Correlation with physical exam  recommended.  DG Ankle Complete Right [CG]  2256 1. Comminuted and mildly displaced fracture of the distal right tibial shaft, better evaluated on concomitant ankle radiograph. 2. Subtle linear lucencies with sclerotic margins extending through the bases of the right third and fourth metatarsals, and possibly fifth metatarsal, suggesting possible stress fractures. Findings are age indeterminate, but suspected to at least be subacute in nature. Correlation with physical exam recommended. 3. Diffuse soft tissue swelling about the foot. 4. Advanced atherosclerosis for age.  DG Foot Complete Right [CG]    Clinical Course User Index [CG] Kinnie Feil, PA-C       38 yo F with traumatic right ankle pain. She developed a blister in medial ankle after wearing a  cam walker for one day. EMR reviewed to obtain pertinent PMH.   Exam reveals focal tenderness, edema to right medial ankle with foot held in external rotation. Mild skin warmth.   Vascular US obtained at triage reviewed by me, negative. X-ray reveals a right distal tib shaft fracture with mild displacement and possible metatarsal stress fractures. X-ray shows soft tissue swelling but clinically there are no open wounds around the fracture site, this is likely reactive soft tissue inflammatory changes causing warmth, edema.  Blister noted that is likely from rubbing from cam walker not properly fitted.  Dr Wynelle Link consulted who agrees with short leg splint and clinic f/u.   Re-evaluated patient after splint and extremity remains NVI.    Discharge with pain control, elevation, ice and ortho f/u. Return precautions given. Shared with EDP.   Final Clinical Impressions(s) / ED Diagnoses   Final diagnoses:  Closed displaced oblique fracture of shaft of right tibia, initial encounter  Blister    ED Discharge Orders         Ordered    oxyCODONE-acetaminophen (PERCOCET/ROXICET) 5-325 MG tablet  Every 6 hours PRN     05/21/19 0053           Kinnie Feil, PA-C 05/21/19 1318    Horton, Barbette Hair, MD 05/25/19 [phone]

## 2019-05-21 NOTE — ED Notes (Signed)
Attempted to call pt's guardian with no answer.

## 2019-05-24 ENCOUNTER — Inpatient Hospital Stay (HOSPITAL_COMMUNITY)
Admission: EM | Admit: 2019-05-24 | Discharge: 2019-06-01 | DRG: 637 | Disposition: A | Discharge status: Home Health | Payer: Medicaid Other | Attending: Internal Medicine | Admitting: Internal Medicine

## 2019-05-24 ENCOUNTER — Emergency Department (HOSPITAL_COMMUNITY): Payer: Medicaid Other

## 2019-05-24 ENCOUNTER — Encounter (HOSPITAL_COMMUNITY): Payer: Self-pay | Admitting: Emergency Medicine

## 2019-05-24 ENCOUNTER — Other Ambulatory Visit: Payer: Self-pay

## 2019-05-24 DIAGNOSIS — R5383 Other fatigue: Secondary | ICD-10-CM

## 2019-05-24 DIAGNOSIS — E213 Hyperparathyroidism, unspecified: Secondary | ICD-10-CM | POA: Diagnosis present

## 2019-05-24 DIAGNOSIS — I120 Hypertensive chronic kidney disease with stage 5 chronic kidney disease or end stage renal disease: Secondary | ICD-10-CM | POA: Diagnosis present

## 2019-05-24 DIAGNOSIS — F1010 Alcohol abuse, uncomplicated: Secondary | ICD-10-CM | POA: Diagnosis present

## 2019-05-24 DIAGNOSIS — E119 Type 2 diabetes mellitus without complications: Secondary | ICD-10-CM

## 2019-05-24 DIAGNOSIS — Z79899 Other long term (current) drug therapy: Secondary | ICD-10-CM

## 2019-05-24 DIAGNOSIS — E11649 Type 2 diabetes mellitus with hypoglycemia without coma: Principal | ICD-10-CM | POA: Diagnosis present

## 2019-05-24 DIAGNOSIS — F17210 Nicotine dependence, cigarettes, uncomplicated: Secondary | ICD-10-CM | POA: Diagnosis present

## 2019-05-24 DIAGNOSIS — D631 Anemia in chronic kidney disease: Secondary | ICD-10-CM | POA: Diagnosis present

## 2019-05-24 DIAGNOSIS — I10 Essential (primary) hypertension: Secondary | ICD-10-CM | POA: Diagnosis present

## 2019-05-24 DIAGNOSIS — Z7982 Long term (current) use of aspirin: Secondary | ICD-10-CM

## 2019-05-24 DIAGNOSIS — Z794 Long term (current) use of insulin: Secondary | ICD-10-CM

## 2019-05-24 DIAGNOSIS — D649 Anemia, unspecified: Secondary | ICD-10-CM

## 2019-05-24 DIAGNOSIS — Z5329 Procedure and treatment not carried out because of patient's decision for other reasons: Secondary | ICD-10-CM | POA: Diagnosis not present

## 2019-05-24 DIAGNOSIS — Z6837 Body mass index (BMI) 37.0-37.9, adult: Secondary | ICD-10-CM

## 2019-05-24 DIAGNOSIS — F319 Bipolar disorder, unspecified: Secondary | ICD-10-CM | POA: Diagnosis present

## 2019-05-24 DIAGNOSIS — E1122 Type 2 diabetes mellitus with diabetic chronic kidney disease: Secondary | ICD-10-CM | POA: Diagnosis present

## 2019-05-24 DIAGNOSIS — Z992 Dependence on renal dialysis: Secondary | ICD-10-CM

## 2019-05-24 DIAGNOSIS — N186 End stage renal disease: Secondary | ICD-10-CM

## 2019-05-24 DIAGNOSIS — G9341 Metabolic encephalopathy: Secondary | ICD-10-CM | POA: Diagnosis present

## 2019-05-24 DIAGNOSIS — N2581 Secondary hyperparathyroidism of renal origin: Secondary | ICD-10-CM | POA: Diagnosis present

## 2019-05-24 DIAGNOSIS — E861 Hypovolemia: Secondary | ICD-10-CM | POA: Diagnosis present

## 2019-05-24 DIAGNOSIS — F209 Schizophrenia, unspecified: Secondary | ICD-10-CM | POA: Diagnosis present

## 2019-05-24 DIAGNOSIS — Z87440 Personal history of urinary (tract) infections: Secondary | ICD-10-CM

## 2019-05-24 DIAGNOSIS — E349 Endocrine disorder, unspecified: Secondary | ICD-10-CM

## 2019-05-24 DIAGNOSIS — Z20828 Contact with and (suspected) exposure to other viral communicable diseases: Secondary | ICD-10-CM | POA: Diagnosis present

## 2019-05-24 DIAGNOSIS — S8292XA Unspecified fracture of left lower leg, initial encounter for closed fracture: Secondary | ICD-10-CM | POA: Diagnosis present

## 2019-05-24 DIAGNOSIS — R4182 Altered mental status, unspecified: Secondary | ICD-10-CM

## 2019-05-24 DIAGNOSIS — Z9049 Acquired absence of other specified parts of digestive tract: Secondary | ICD-10-CM

## 2019-05-24 DIAGNOSIS — R531 Weakness: Secondary | ICD-10-CM | POA: Diagnosis not present

## 2019-05-24 DIAGNOSIS — R7989 Other specified abnormal findings of blood chemistry: Secondary | ICD-10-CM

## 2019-05-24 DIAGNOSIS — F70 Mild intellectual disabilities: Secondary | ICD-10-CM | POA: Diagnosis present

## 2019-05-24 DIAGNOSIS — E871 Hypo-osmolality and hyponatremia: Secondary | ICD-10-CM | POA: Diagnosis not present

## 2019-05-24 DIAGNOSIS — S8291XA Unspecified fracture of right lower leg, initial encounter for closed fracture: Secondary | ICD-10-CM | POA: Diagnosis present

## 2019-05-24 DIAGNOSIS — F4325 Adjustment disorder with mixed disturbance of emotions and conduct: Secondary | ICD-10-CM | POA: Diagnosis present

## 2019-05-24 DIAGNOSIS — R630 Anorexia: Secondary | ICD-10-CM

## 2019-05-24 LAB — CBC WITH DIFFERENTIAL/PLATELET
Abs Immature Granulocytes: 0.25 10*3/uL — ABNORMAL HIGH (ref 0.00–0.07)
Basophils Absolute: 0 10*3/uL (ref 0.0–0.1)
Basophils Relative: 0 %
Eosinophils Absolute: 0.2 10*3/uL (ref 0.0–0.5)
Eosinophils Relative: 3 %
HCT: 23.4 % — ABNORMAL LOW (ref 36.0–46.0)
Hemoglobin: 7.1 g/dL — ABNORMAL LOW (ref 12.0–15.0)
Immature Granulocytes: 3 %
Lymphocytes Relative: 19 %
Lymphs Abs: 1.4 10*3/uL (ref 0.7–4.0)
MCH: 32.1 pg (ref 26.0–34.0)
MCHC: 30.3 g/dL (ref 30.0–36.0)
MCV: 105.9 fL — ABNORMAL HIGH (ref 80.0–100.0)
Monocytes Absolute: 0.8 10*3/uL (ref 0.1–1.0)
Monocytes Relative: 11 %
Neutro Abs: 4.8 10*3/uL (ref 1.7–7.7)
Neutrophils Relative %: 64 %
Platelets: 234 10*3/uL (ref 150–400)
RBC: 2.21 MIL/uL — ABNORMAL LOW (ref 3.87–5.11)
RDW: 14.6 % (ref 11.5–15.5)
WBC: 7.5 10*3/uL (ref 4.0–10.5)
nRBC: 0 % (ref 0.0–0.2)

## 2019-05-24 LAB — LACTIC ACID, PLASMA
Lactic Acid, Venous: 0.9 mmol/L (ref 0.5–1.9)
Lactic Acid, Venous: 0.9 mmol/L (ref 0.5–1.9)

## 2019-05-24 LAB — COMPREHENSIVE METABOLIC PANEL
ALT: 14 U/L (ref 0–44)
AST: 20 U/L (ref 15–41)
Albumin: 2.7 g/dL — ABNORMAL LOW (ref 3.5–5.0)
Alkaline Phosphatase: 83 U/L (ref 38–126)
Anion gap: 15 (ref 5–15)
BUN: 31 mg/dL — ABNORMAL HIGH (ref 6–20)
CO2: 28 mmol/L (ref 22–32)
Calcium: 10.6 mg/dL — ABNORMAL HIGH (ref 8.9–10.3)
Chloride: 96 mmol/L — ABNORMAL LOW (ref 98–111)
Creatinine, Ser: 9.24 mg/dL — ABNORMAL HIGH (ref 0.44–1.00)
GFR calc Af Amer: 6 mL/min — ABNORMAL LOW (ref >60)
GFR calc non Af Amer: 5 mL/min — ABNORMAL LOW (ref >60)
Glucose, Bld: 130 mg/dL — ABNORMAL HIGH (ref 70–99)
Potassium: 3.5 mmol/L (ref 3.5–5.1)
Sodium: 139 mmol/L (ref 135–145)
Total Bilirubin: 0.6 mg/dL (ref 0.3–1.2)
Total Protein: 7 g/dL (ref 6.5–8.1)

## 2019-05-24 LAB — I-STAT BETA HCG BLOOD, ED (MC, WL, AP ONLY): I-stat hCG, quantitative: 14 m[IU]/mL — ABNORMAL HIGH (ref <5)

## 2019-05-24 LAB — HCG, QUANTITATIVE, PREGNANCY: hCG, Beta Chain, Quant, S: 6 m[IU]/mL — ABNORMAL HIGH (ref <5)

## 2019-05-24 LAB — I-STAT CHEM 8, ED
BUN: 29 mg/dL — ABNORMAL HIGH (ref 6–20)
Calcium, Ion: 1.25 mmol/L (ref 1.15–1.40)
Chloride: 98 mmol/L (ref 98–111)
Creatinine, Ser: 9.2 mg/dL — ABNORMAL HIGH (ref 0.44–1.00)
Glucose, Bld: 125 mg/dL — ABNORMAL HIGH (ref 70–99)
HCT: 23 % — ABNORMAL LOW (ref 36.0–46.0)
Hemoglobin: 7.8 g/dL — ABNORMAL LOW (ref 12.0–15.0)
Potassium: 3.5 mmol/L (ref 3.5–5.1)
Sodium: 136 mmol/L (ref 135–145)
TCO2: 32 mmol/L (ref 22–32)

## 2019-05-24 LAB — SARS CORONAVIRUS 2 BY RT PCR (HOSPITAL ORDER, PERFORMED IN ~~LOC~~ HOSPITAL LAB): SARS Coronavirus 2: NEGATIVE

## 2019-05-24 LAB — CBG MONITORING, ED: Glucose-Capillary: 103 mg/dL — ABNORMAL HIGH (ref 70–99)

## 2019-05-24 LAB — ETHANOL: Alcohol, Ethyl (B): <10 mg/dL (ref <10)

## 2019-05-24 MED ORDER — INSULIN ASPART 100 UNIT/ML ~~LOC~~ SOLN
0.0000 [IU] | Freq: Three times a day (TID) | SUBCUTANEOUS | Status: DC
  Administered 2019-05-27: 09:00:00 1 [IU] via SUBCUTANEOUS
  Administered 2019-05-29: 2 [IU] via SUBCUTANEOUS
  Administered 2019-05-30: 08:00:00 3 [IU] via SUBCUTANEOUS
  Administered 2019-05-30: 13:00:00 2 [IU] via SUBCUTANEOUS
  Administered 2019-05-30: 18:00:00 1 [IU] via SUBCUTANEOUS
  Administered 2019-05-31: 3 [IU] via SUBCUTANEOUS
  Administered 2019-05-31: 1 [IU] via SUBCUTANEOUS
  Administered 2019-05-31: 2 [IU] via SUBCUTANEOUS
  Administered 2019-06-01 (×2): 1 [IU] via SUBCUTANEOUS

## 2019-05-24 MED ORDER — INSULIN ASPART 100 UNIT/ML ~~LOC~~ SOLN: 0.0000 [IU] | Freq: Every day | SUBCUTANEOUS | Status: DC

## 2019-05-24 NOTE — ED Triage Notes (Signed)
Pt missed dialysis yesterday. Family states she is lethargic and not eating . Pt is a poor historian

## 2019-05-24 NOTE — ED Notes (Signed)
Attempted to call pt contact [employer] , who did not answer. [place]'s wife answered but was unaware that pt was in the hospital and is unable to provide staff with information for her current visit.

## 2019-05-24 NOTE — ED Provider Notes (Signed)
[facility] EMERGENCY DEPARTMENT Provider Note   CSN: [id number] Arrival date & time: 05/24/19  1756    History   Chief Complaint Chief Complaint  Patient presents with   Weakness    HPI Kelly [name] is a 38 y.o. female.     HPI   38 year old female with a history of diabetes, etoh abuse, bipolar, hypertension, hyperparathyroidism, cognitive impairment who resides in a group home, ESRD on dialysis MWF presents with concern for fatigue.  History is limited by patient's history of cognitive impairment and altered mental status.  Reports that since dialysis on Friday, she is had severe fatigue, has not wanted to walk around.  She has had a low appetite, not eating or drinking normally.  She reports she has had loss of smell and taste.  Reports chills, but no known fevers. Denies nausea, vomiting, cough, congestion or sore throat.  She does not answer when asked about new medications or taking any medications today.  Per group home, they are concerned that she had too much fluid taken off at dialysis on Friday, and that she has been spending a lot of time outside and are concerned that she is dehydrated.  They give her oxycodone once per day.  Reports shortness of breath on review of systems, denies chest pain.   Concern at group home that she has not been eating or drinking, has been fatigued much more than usual.  Now doesn't have the strength get up or move around.  Monday had stretched at dialysis and collapsed.   Got invega shot, had to stand on the leg during shot. When left to return home noticed it was swelling. Came here days ago and diagnosed with fx. Monday of last week. Took her off of 3 medications, cutting back on medications. Percocet rx Wednesday. 4AM had first dose THursday, bedtime had another, Friday AM gave her oxycodone/percocet, has had 4 doses since it was filled Thursday.   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  She was noted did not have any focal neurologic findings or one-sided weakness, but noted to have mild global weakness and increased tone.  She is slow to answer questions, unable to provide a full history independently.  Past Medical History:  Diagnosis Date   Alcohol abuse    chronic   Bipolar disorder ([place])    Chronic kidney disease    Cognitive changes    Cognitive impairment    Depression    Depression    Diabetes mellitus without complication (HCC)    Elevated lipids    Hyperparathyroidism ([place])    Hypertension    Staph aureus infection    A/V fistula   UTI (lower urinary tract infection)     Patient Active Problem List   Diagnosis Date Noted   Weakness generalized 05/24/2019   Influenza A 12/22/2018   Hypotension 09/11/5101   Complication from renal dialysis device 11/06/2018   Multiple fractures of both lower extremities 06/18/2017   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   Hyperkalemia 06/25/2016   Adjustment disorder with mixed disturbance of emotions and conduct    Diabetes ([place]) 04/23/2015   Essential hypertension 04/23/2015   ESRD on dialysis ([place]) 04/23/2015   Pain of left arm 04/22/2015    Past Surgical History:  Procedure Laterality Date   A/V SHUNTOGRAM Left 10/07/2018   Procedure: A/V SHUNTOGRAM;  Surgeon: Katha Cabal,  MD;  Location: [place] [facility];  Service: Cardiovascular;  Laterality: Left;   AV FISTULA PLACEMENT     CHOLECYSTECTOMY     dialysis perma cath Right    chest   PERIPHERAL VASCULAR CATHETERIZATION N/A 05/24/2015   Procedure: Dialysis/Perma Catheter Removal;  Surgeon: Katha Cabal, MD;  Location: [place] [facility];  Service: Cardiovascular;  Laterality: N/A;   PERIPHERAL VASCULAR CATHETERIZATION Left 02/28/2016   Procedure: A/V Shuntogram/Fistulagram;  Surgeon: Katha Cabal,  MD;  Location: [place] [facility];  Service: Cardiovascular;  Laterality: Left;   PERIPHERAL VASCULAR CATHETERIZATION N/A 02/28/2016   Procedure: A/V Shunt Intervention;  Surgeon: Katha Cabal, MD;  Location: [place] [facility];  Service: Cardiovascular;  Laterality: N/A;   PERIPHERAL VASCULAR CATHETERIZATION N/A 06/26/2016   Procedure: A/V Shuntogram/Fistulagram;  Surgeon: Katha Cabal, MD;  Location: [place] [facility];  Service: Cardiovascular;  Laterality: N/A;   PERIPHERAL VASCULAR CATHETERIZATION N/A 09/10/2016   Procedure: A/V Shuntogram/Fistulagram;  Surgeon: Algernon Huxley, MD;  Location: [place] [facility];  Service: Cardiovascular;  Laterality: N/A;   PERIPHERAL VASCULAR CATHETERIZATION Left 09/14/2016   Procedure: Thrombectomy;  Surgeon: Katha Cabal, MD;  Location: [place] [facility];  Service: Cardiovascular;  Laterality: Left;   VASCULAR ACCESS DEVICE INSERTION Left 04/22/2015   Procedure: INSERTION OF HERO VASCULAR ACCESS DEVICE;  Surgeon: Katha Cabal, MD;  Location: [facility];  Service: Vascular;  Laterality: Left;     OB History   No obstetric history on file.      Home Medications    Prior to Admission medications   Medication Sig Start Date End Date Taking? Authorizing Provider  acetaminophen (TYLENOL) 325 MG tablet Take 2 tablets (650 mg total) by mouth every 6 (six) hours as needed for mild pain, moderate pain or headache. Patient taking differently: Take 650 mg by mouth every 6 (six) hours as needed for mild pain.  10/01/16  Yes Clapacs, Madie Reno, MD  acetaminophen (TYLENOL) 325 MG tablet Take 650 mg by mouth every Monday, Wednesday, and Friday with hemodialysis.   Yes [provider]  albuterol (PROVENTIL HFA;VENTOLIN HFA) 108 (90 Base) MCG/ACT inhaler Inhale 2 puffs into the lungs every 4 (four) hours as needed for wheezing or shortness of breath.   Yes [provider]  aspirin 81 MG chewable tablet Chew 1 tablet (81 mg  total) by mouth daily. 10/02/16  Yes Clapacs, Madie Reno, MD  B Complex-C-Folic Acid (RENA-VITE RX) 1 MG TABS Take 1 tablet by mouth daily. 10/01/16  Yes Clapacs, Madie Reno, MD  calcium acetate (PHOSLO) 667 MG capsule Take 2 capsules (1,334 mg total) by mouth 2 (two) times daily with a meal. 10/01/16  Yes Clapacs, Madie Reno, MD  cetaphil (CETAPHIL) lotion Apply 1 application topically daily. To left heel   Yes [provider]  cetirizine (ZYRTEC) 10 MG tablet Take 10 mg by mouth daily.   Yes [provider]  chlorhexidine (PERIDEX) 0.12 % solution Use as directed 15 mLs in the mouth or throat 2 (two) times daily. Rinse for 1 min then spit 02/01/19  Yes Wieters, Hallie C, PA-C  cinacalcet (SENSIPAR) 60 MG tablet Take 1 tablet (60 mg total) by mouth daily. Patient taking differently: Take 120 mg by mouth daily.  10/01/16  Yes Clapacs, Madie Reno, MD  cloNIDine (CATAPRES) 0.1 MG tablet Take 1 tablet (0.1 mg total) by mouth 2 (two) times daily. 10/01/16  Yes Clapacs, Madie Reno, MD  divalproex (  DEPAKOTE) 500 MG DR tablet Take 2 tablets (1,000 mg total) by mouth at bedtime. Patient taking differently: Take 500-1,000 mg by mouth See admin instructions. Take 1 tablet every morning and take 2 tablets at bedtime 10/01/16  Yes Clapacs, Madie Reno, MD  docusate sodium (COLACE) 100 MG capsule Take 1 capsule (100 mg total) by mouth at bedtime. 10/01/16  Yes Clapacs, Madie Reno, MD  escitalopram (LEXAPRO) 10 MG tablet Take 1.5 tablets (15 mg total) by mouth daily. Patient taking differently: Take 10 mg by mouth daily. Take with 5 mg tablet to equal 15 mg daily 10/02/16  Yes Clapacs, Madie Reno, MD  escitalopram (LEXAPRO) 5 MG tablet Take 5 mg by mouth daily. Takes along with 10mg  totally 15mg  daily.   Yes [provider]  fluticasone (FLOVENT HFA) 110 MCG/ACT inhaler Inhale 2 puffs into the lungs 2 (two) times daily.   Yes [provider]  folic acid (FOLVITE) 1 MG tablet Take 2 tablets (2 mg total) by mouth at  bedtime. 10/01/16  Yes Clapacs, Madie Reno, MD  glucagon (GLUCAGON EMERGENCY) 1 MG injection Inject 1 mg into the vein once as needed (low blood sugar).   Yes [provider]  insulin aspart (NOVOLOG) 100 UNIT/ML injection Inject 0-15 Units into the skin 3 (three) times daily with meals. Per sliding scale    Yes [provider]  insulin glargine (LANTUS) 100 UNIT/ML injection Inject 20 Units into the skin at bedtime.   Yes [provider]  lamoTRIgine (LAMICTAL) 150 MG tablet Take 150 mg by mouth 2 (two) times daily.    Yes [provider]  lidocaine-prilocaine (EMLA) cream Apply 1 application topically 3 (three) times a week. Apply to access site 30 minutes before dialysis. Patient taking differently: Apply 1 application topically every Monday, Wednesday, and Friday. Apply to access site 30 minutes before dialysis.  10/01/16  Yes Clapacs, Madie Reno, MD  medroxyPROGESTERone (DEPO-PROVERA) 150 MG/ML injection Inject 150 mg into the muscle every 3 (three) months.   Yes [provider]  metoprolol (LOPRESSOR) 50 MG tablet Take 1 tablet (50 mg total) by mouth 2 (two) times daily. 10/01/16  Yes Clapacs, Madie Reno, MD  Multiple Vitamin (THEREMS) TABS Take 1 tablet by mouth daily. 10/01/16  Yes Clapacs, Madie Reno, MD  oxyCODONE-acetaminophen (PERCOCET/ROXICET) 5-325 MG tablet Take 1 tablet by mouth every 6 (six) hours as needed for severe pain. 05/21/19  Yes Horton, Barbette Hair, MD  paliperidone (INVEGA SUSTENNA) 156 MG/ML SUSY injection Inject 156 mg into the muscle See admin instructions. EVERY 28 DAYS   Yes [provider]  paliperidone (INVEGA SUSTENNA) 234 MG/1.5ML SUSY injection Inject 234 mg into the muscle every 30 (thirty) days.   Yes [provider]  pantoprazole (PROTONIX) 40 MG tablet Take 40 mg by mouth daily.   Yes [provider]  patiromer (VELTASSA) 8.4 g packet Take 1 packet (8.4 g total) by mouth daily. Patient taking differently: Take  8.4 g by mouth daily. Mixed into 1/3 cup of drink 10/01/16  Yes Clapacs, Madie Reno, MD  Tiotropium Bromide-Olodaterol (STIOLTO RESPIMAT) 2.5-2.5 MCG/ACT AERS Inhale 2 puffs into the lungs daily.   Yes [provider]  traZODone (DESYREL) 150 MG tablet Take 150 mg by mouth at bedtime.   Yes [provider]  risperiDONE (RISPERDAL) 0.5 MG tablet Take 1 tablet (0.5 mg total) by mouth 2 (two) times daily. Patient not taking: Reported on 05/24/2019 12/26/18   Dustin Flock, MD  traZODone (DESYREL) 100  MG tablet Take 1 tablet (100 mg total) by mouth at bedtime. Patient not taking: Reported on 05/24/2019 10/01/16   Clapacs, Madie Reno, MD  vitamin B-12 (CYANOCOBALAMIN) 1000 MCG tablet Take 1 tablet (1,000 mcg total) by mouth at bedtime. Patient not taking: Reported on 05/24/2019 10/01/16   Clapacs, Madie Reno, MD    Family History Family History  Family history unknown: Yes    Social History Social History   Tobacco Use   Smoking status: Current Every Day Smoker    Packs/day: 0.25    Years: 0.00    Pack years: 0.00    Types: Cigarettes   Smokeless tobacco: Current User  Substance Use Topics   Alcohol use: No   Drug use: No     Allergies   Patient has no known allergies.   Review of Systems Review of Systems  Unable to perform ROS: Mental status change  Constitutional: Positive for activity change, appetite change, chills and fatigue.  HENT: Negative for sore throat.   Respiratory: Positive for shortness of breath. Negative for cough.   Cardiovascular: Negative for chest pain.  Gastrointestinal: Positive for constipation. Negative for abdominal pain, diarrhea, nausea and vomiting.  Musculoskeletal: Positive for arthralgias (right broken leg).  Neurological: Negative for weakness, numbness and headaches.     Physical Exam Updated Vital Signs BP (!) 87/58 (BP Location: Right Arm)    Pulse 81    Temp 98.1 F (36.7 C) (Axillary)    Resp 18    Ht 5\' 1"  (1.549 m)    Wt  88.5 kg    SpO2 90%    BMI 36.87 kg/m   Physical Exam Vitals signs and nursing note reviewed.  Constitutional:      General: She is not in acute distress.    Appearance: She is well-developed. She is not diaphoretic.  HENT:     Head: Normocephalic and atraumatic.  Eyes:     Conjunctiva/sclera: Conjunctivae normal.  Neck:     Musculoskeletal: Normal range of motion.  Cardiovascular:     Rate and Rhythm: Normal rate and regular rhythm.     Heart sounds: Normal heart sounds. No murmur. No friction rub. No gallop.   Pulmonary:     Effort: Pulmonary effort is normal. No respiratory distress.     Breath sounds: Normal breath sounds. No wheezing or rales.  Abdominal:     General: There is no distension.     Palpations: Abdomen is soft.     Tenderness: There is no abdominal tenderness. There is no guarding.  Musculoskeletal:        General: No tenderness.     Comments: Right leg in splint  Skin:    General: Skin is warm and dry.     Findings: No erythema or rash.  Neurological:     Comments: Sleepy, slow to respond States name and location Will answer most questions with delay, sometimes will not answer Slight increased tone of bilateral upper arms       ED Treatments / Results  Labs (all labs ordered are listed, but only abnormal results are displayed) Labs Reviewed  CBC WITH DIFFERENTIAL/PLATELET - Abnormal; Notable for the following components:      Result Value   RBC 2.21 (*)    Hemoglobin 7.1 (*)    HCT 23.4 (*)    MCV 105.9 (*)    Abs Immature Granulocytes 0.25 (*)    All other components within normal limits  COMPREHENSIVE METABOLIC PANEL - Abnormal; Notable for  the following components:   Chloride 96 (*)    Glucose, Bld 130 (*)    BUN 31 (*)    Creatinine, Ser 9.24 (*)    Calcium 10.6 (*)    Albumin 2.7 (*)    GFR calc non Af Amer 5 (*)    GFR calc Af Amer 6 (*)    All other components within normal limits  HCG, QUANTITATIVE, PREGNANCY - Abnormal; Notable  for the following components:   hCG, Beta Chain, Quant, S 6 (*)    All other components within normal limits  HEMOGLOBIN A1C - Abnormal; Notable for the following components:   Hgb A1c MFr Bld 6.3 (*)    All other components within normal limits  COMPREHENSIVE METABOLIC PANEL - Abnormal; Notable for the following components:   Potassium 3.4 (*)    Chloride 96 (*)    Glucose, Bld 109 (*)    BUN 36 (*)    Creatinine, Ser 10.49 (*)    Total Protein 6.2 (*)    Albumin 2.4 (*)    GFR calc non Af Amer 4 (*)    GFR calc Af Amer 5 (*)    All other components within normal limits  CBC - Abnormal; Notable for the following components:   RBC 2.02 (*)    Hemoglobin 6.4 (*)    HCT 20.9 (*)    MCV 103.5 (*)    All other components within normal limits  GLUCOSE, CAPILLARY - Abnormal; Notable for the following components:   Glucose-Capillary 120 (*)    All other components within normal limits  GLUCOSE, CAPILLARY - Abnormal; Notable for the following components:   Glucose-Capillary 114 (*)    All other components within normal limits  GLUCOSE, CAPILLARY - Abnormal; Notable for the following components:   Glucose-Capillary 105 (*)    All other components within normal limits  CBG MONITORING, ED - Abnormal; Notable for the following components:   Glucose-Capillary 103 (*)    All other components within normal limits  I-STAT CHEM 8, ED - Abnormal; Notable for the following components:   BUN 29 (*)    Creatinine, Ser 9.20 (*)    Glucose, Bld 125 (*)    Hemoglobin 7.8 (*)    HCT 23.0 (*)    All other components within normal limits  I-STAT BETA HCG BLOOD, ED ([facility], [facility], [facility] ONLY) - Abnormal; Notable for the following components:   I-stat hCG, quantitative 14.0 (*)    All other components within normal limits  SARS CORONAVIRUS 2 (HOSPITAL ORDER, [place] [facility])  URINE CULTURE  LACTIC ACID, PLASMA  LACTIC ACID, PLASMA  ETHANOL  URINALYSIS, ROUTINE W REFLEX MICROSCOPIC    RAPID URINE DRUG SCREEN, HOSP PERFORMED  HIV ANTIBODY (ROUTINE TESTING W REFLEX)  HCG, SERUM, QUALITATIVE  TYPE AND SCREEN  ABO/RH  PREPARE RBC (CROSSMATCH)    EKG EKG Interpretation  Date/Time:  Sunday May 24 2019 18:13:21 EDT Ventricular Rate:  86 PR Interval:    QRS Duration: 91 QT Interval:  412 QTC Calculation: 493 R Axis:   2 Text Interpretation:  Sinus rhythm Borderline prolonged QT interval No significant change since last tracing Confirmed by Gareth Morgan [phone]) on 05/24/2019 7:43:33 PM Also confirmed by Gareth Morgan [phone]), editor Philomena Doheny [phone])  on 05/25/2019 7:19:00 AM   Radiology Ct Head Wo Contrast  Result Date: 05/24/2019 CLINICAL DATA:  38 y/o F; altered mental status, unclear cause. Missed dialysis yesterday, lethargy and not eating per  family. EXAM: CT HEAD WITHOUT CONTRAST TECHNIQUE: Contiguous axial images were obtained from the base of the skull through the vertex without intravenous contrast. COMPARISON:  10/26/2018 CT head FINDINGS: Brain: No evidence of acute infarction, hemorrhage, hydrocephalus, extra-axial collection or mass lesion/mass effect. 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 Stable nonspecific white matter hypodensities compatible with chronic microvascular ischemic changes and stable volume loss of the brain. Vascular: Calcific atherosclerosis of the internal carotid arteries. No hyperdense vessel. Skull: Normal. Negative for fracture or focal lesion. Sinuses/Orbits: No acute finding. Other: Multiple nodules are arising from the right greater than left outer ears. IMPRESSION: 1. No acute intracranial abnormality identified. 2. Stable chronic microvascular ischemic changes and parenchymal volume loss of the brain. 3. 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 4. Multiple nodules arising from the right greater than left outer ears. Electronically Signed   By: Kristine Garbe M.D.   On: 05/24/2019 19:17   Dg Chest Port 1 View  Result Date: 05/24/2019 CLINICAL DATA:  Shortness of breath EXAM: PORTABLE CHEST 1 VIEW COMPARISON:  01/20/2019 FINDINGS: Cardiomegaly. Left upper extremity HERO type vascular graft. Both lungs are clear. The visualized skeletal structures are unremarkable. IMPRESSION: Cardiomegaly without acute abnormality of the lungs in AP portable projection. Electronically Signed   By: Eddie Candle M.D.   On: 05/24/2019 19:02    Procedures Procedures (including critical care time)  Medications Ordered in ED Medications  enoxaparin (LOVENOX) injection 30 mg (has no administration in time range)  ondansetron (ZOFRAN) tablet 4 mg (4 mg Oral Given 05/25/19 0117)    Or  ondansetron (ZOFRAN) injection 4 mg ( Intravenous See Alternative 05/25/19 0117)  insulin aspart (novoLOG) injection 0-9 Units (0 Units Subcutaneous Not Given 05/25/19 0644)  insulin aspart (novoLOG) injection 0-5 Units (0 Units Subcutaneous Not Given 05/25/19 0118)  acetaminophen (TYLENOL) tablet 650 mg (has no administration in time range)  aspirin chewable tablet 81 mg (has no administration in time range)  multivitamin (RENA-VIT) tablet 1 tablet (has no administration in time range)  calcium acetate (PHOSLO) capsule 1,334 mg (has no administration in time range)  cinacalcet (SENSIPAR) tablet 120 mg (has no administration in time range)  cloNIDine (CATAPRES) tablet 0.1 mg (0.1 mg Oral Given 05/25/19 0116)  divalproex (DEPAKOTE) DR tablet 500 mg (has no administration in time range)  docusate sodium (COLACE) capsule 100 mg (100 mg Oral Given 01/08/07 6578)  folic acid (FOLVITE) tablet 2 mg (2 mg Oral Given 05/25/19 0116)  metoprolol tartrate (LOPRESSOR) tablet 50 mg (50 mg Oral Given 05/25/19 0116)  multivitamin with minerals tablet 1 tablet (has no administration in time range)  patiromer Daryll Drown) packet 8.4 g (has no administration in time range)  insulin glargine  (LANTUS) injection 20 Units (20 Units Subcutaneous Given 05/25/19 0118)  lamoTRIgine (LAMICTAL) tablet 150 mg (150 mg Oral Given 05/25/19 0117)  cetaphil lotion 1 application (has no administration in time range)  pantoprazole (PROTONIX) EC tablet 40 mg (has no administration in time range)  budesonide (PULMICORT) nebulizer solution 0.5 mg (0.5 mg Inhalation Given 05/25/19 0750)  albuterol (PROVENTIL) (2.5 MG/3ML) 0.083% nebulizer solution 3 mL (has no administration in time range)  chlorhexidine (PERIDEX) 0.12 % solution 15 mL (15 mLs Mouth/Throat Given 05/25/19 0123)  loratadine (CLARITIN) tablet 10 mg (has no administration in time range)  oxyCODONE-acetaminophen (PERCOCET/ROXICET) 5-325 MG per tablet 1 tablet (has no administration in time range)  acetaminophen (TYLENOL) tablet 650 mg (has no administration in time range)  traZODone (DESYREL) tablet 150 mg (150 mg Oral Given 05/25/19 0117)  divalproex (DEPAKOTE) DR tablet 1,000 mg (1,000 mg Oral Given 05/25/19 0123)  escitalopram (LEXAPRO) tablet 15 mg (has no administration in time range)  0.9 %  sodium chloride infusion (Manually program via Guardrails IV Fluids) (has no administration in time range)     Initial Impression / Assessment and Plan / ED Course  I have reviewed the triage vital signs and the nursing notes.  Pertinent labs & imaging results that were available during my care of the patient were reviewed by me and considered in my medical decision making (see chart for details).        38 year old female with a history of diabetes, etoh abuse, bipolar, hypertension, hyperparathyroidism, cognitive impairment who resides in a group home, ESRD on dialysis MWF presents with concern for fatigue, on review of systems also reports decreased appetite, chills, and loss of smell and taste although she is not clearly a reliable historian.  CT head was completed which showed stable microvascular ischemic changes and parenchymal volume loss of  the brain, stable chronic infarction of the left lateral cerebellar hemisphere.  Chest x-ray shows cardiomegaly without other acute abnormalities.  Labs show hypercalcemia, anemia which is slightly worsened from baseline, and hCG level of 14.  COVID19 testing is negative.  No focal findings to suggest CVA, some of her slow speech appears to be baseline based on previous notes.  Discussed with group home who is concerned she is really not acting herself. He reports pregnancy is unlikely as she has not been around any men with the exception of himself and only very recently had staff change. The patient is unclear about this possibility, states "maybe I am pregnant" when told this was a possibility.  Does report she feels safe at the group home.  DDx for her symptoms include fatigue related to oxycodone use (although it has been minimal per report) for right leg fracture, pain secondary to fracture, worsening anemia, viral infection, early pregnancy or depression.  Will admit for continued monitoring of symptoms off of potentially sedating medications, hcg, hgb.    Final Clinical Impressions(s) / ED Diagnoses   Final diagnoses:  Altered mental status, unspecified altered mental status type  Appetite loss  Other fatigue  Elevated serum hCG  Anemia, unspecified type    ED Discharge Orders    None       Gareth Morgan, MD 05/25/19 [phone]

## 2019-05-24 NOTE — ED Notes (Signed)
ED TO INPATIENT HANDOFF REPORT  ED Nurse Name and Phone #: Kelly [name] [phone]  S Name/Age/Gender Kelly [name] 38 y.o. female Room/Bed: 038C/038C  Code Status   Code Status: Prior  Home/SNF/Other Kelly [name] Patient oriented to: self and place Is this baseline? Yes   Triage Complete: Triage complete  Chief Complaint weakness, dialysis pt  Triage Note Pt missed dialysis yesterday. Family states she is lethargic and not eating . Pt is a poor historian    Allergies No Known Allergies  Level of Care/Admitting Diagnosis ED Disposition    ED Disposition Condition Comment   Admit  Hospital Area: [facility] [[id number]]  Level of Care: Med-Surg [16]  I expect the patient will be discharged within 24 hours: Yes  LOW acuity---Tx typically complete <24 hrs---ACUTE conditions typically can be evaluated <24 hours---LABS likely to return to acceptable levels <24 hours---IS near functional baseline---EXPECTED to return to current living arrangement---NOT newly hypoxic: Meets criteria for 5C-Observation unit  Covid Evaluation: Confirmed COVID Negative  Diagnosis: Weakness generalized [[id number]]  Admitting Physician: Elwyn Reach [[id number]]  Attending Physician: Elwyn Reach [[id number]]  PT Class (Do Not Modify): Observation [104]  PT Acc Code (Do Not Modify): Observation [10022]       B Medical/Surgery History Past Medical History:  Diagnosis Date  . Alcohol abuse    chronic  . Bipolar disorder ([place])   . Chronic kidney disease   . Cognitive changes   . Cognitive impairment   . Depression   . Depression   . Diabetes mellitus without complication ([place])   . Elevated lipids   . Hyperparathyroidism ([place])   . Hypertension   . Staph aureus infection    A/V fistula  . UTI (lower urinary tract infection)    Past Surgical History:  Procedure Laterality Date  . A/V SHUNTOGRAM Left 10/07/2018   Procedure: A/V SHUNTOGRAM;  Surgeon: Katha Cabal, MD;   Location: [facility];  Service: Cardiovascular;  Laterality: Left;  . AV FISTULA PLACEMENT    . CHOLECYSTECTOMY    . dialysis perma cath Right    chest  . PERIPHERAL VASCULAR CATHETERIZATION N/A 05/24/2015   Procedure: Dialysis/Perma Catheter Removal;  Surgeon: Katha Cabal, MD;  Location: [facility];  Service: Cardiovascular;  Laterality: N/A;  . PERIPHERAL VASCULAR CATHETERIZATION Left 02/28/2016   Procedure: A/V Shuntogram/Fistulagram;  Surgeon: Katha Cabal, MD;  Location: [facility];  Service: Cardiovascular;  Laterality: Left;  . PERIPHERAL VASCULAR CATHETERIZATION N/A 02/28/2016   Procedure: A/V Shunt Intervention;  Surgeon: Katha Cabal, MD;  Location: [facility];  Service: Cardiovascular;  Laterality: N/A;  . PERIPHERAL VASCULAR CATHETERIZATION N/A 06/26/2016   Procedure: A/V Shuntogram/Fistulagram;  Surgeon: Katha Cabal, MD;  Location: [facility];  Service: Cardiovascular;  Laterality: N/A;  . PERIPHERAL VASCULAR CATHETERIZATION N/A 09/10/2016   Procedure: A/V Shuntogram/Fistulagram;  Surgeon: Algernon Huxley, MD;  Location: [facility];  Service: Cardiovascular;  Laterality: N/A;  . PERIPHERAL VASCULAR CATHETERIZATION Left 09/14/2016   Procedure: Thrombectomy;  Surgeon: Katha Cabal, MD;  Location: [facility];  Service: Cardiovascular;  Laterality: Left;  Marland Kitchen VASCULAR ACCESS DEVICE INSERTION Left 04/22/2015   Procedure: INSERTION OF HERO VASCULAR ACCESS DEVICE;  Surgeon: Katha Cabal, MD;  Location: [facility];  Service: Vascular;  Laterality: Left;     A IV Location/Drains/Wounds Patient Lines/Drains/Airways Status   Active Line/Drains/Airways    Name:   Placement date:  Placement time:   Site:   Days:   Fistula / Graft Left Upper arm Arteriovenous vein graft   -    -    Upper arm      Fistula / Graft Left Upper arm   -    -    Upper arm             Intake/Output Last 24 hours No intake  or output data in the 24 hours ending 05/24/19 2317  Labs/Imaging Results for orders placed or performed during the hospital encounter of 05/24/19 (from the past 48 hour(s))  CBG monitoring, ED     Status: Abnormal   Collection Time: 05/24/19  6:04 PM  Result Value Ref Range   Glucose-Capillary 103 (H) 70 - 99 mg/dL   Comment 1 Notify RN    Comment 2 Document in Chart   SARS Coronavirus 2 (CEPHEID- Performed in [facility] hospital lab), Hosp Order     Status: None   Collection Time: 05/24/19  6:15 PM   Specimen: Nasopharyngeal Swab  Result Value Ref Range   SARS Coronavirus 2 NEGATIVE NEGATIVE    Comment: (NOTE) If result is NEGATIVE SARS-CoV-2 target nucleic acids are NOT DETECTED. The SARS-CoV-2 RNA is generally detectable in upper and lower  respiratory specimens during the acute phase of infection. The lowest  concentration of SARS-CoV-2 viral copies this assay can detect is 250  copies / mL. A negative result does not preclude SARS-CoV-2 infection  and should not be used as the sole basis for treatment or other  patient management decisions.  A negative result may occur with  improper specimen collection / handling, submission of specimen other  than nasopharyngeal swab, presence of viral mutation(s) within the  areas targeted by this assay, and inadequate number of viral copies  (<250 copies / mL). A negative result must be combined with clinical  observations, patient history, and epidemiological information. If result is POSITIVE SARS-CoV-2 target nucleic acids are DETECTED. The SARS-CoV-2 RNA is generally detectable in upper and lower  respiratory specimens dur ing the acute phase of infection.  Positive  results are indicative of active infection with SARS-CoV-2.  Clinical  correlation with patient history and other diagnostic information is  necessary to determine patient infection status.  Positive results do  not rule out bacterial infection or co-infection with  other viruses. If result is PRESUMPTIVE POSTIVE SARS-CoV-2 nucleic acids MAY BE PRESENT.   A presumptive positive result was obtained on the submitted specimen  and confirmed on repeat testing.  While 2019 novel coronavirus  (SARS-CoV-2) nucleic acids may be present in the submitted sample  additional confirmatory testing may be necessary for epidemiological  and / or clinical management purposes  to differentiate between  SARS-CoV-2 and other Sarbecovirus currently known to infect humans.  If clinically indicated additional testing with an alternate test  methodology [phone]) is advised. The SARS-CoV-2 RNA is generally  detectable in upper and lower respiratory sp ecimens during the acute  phase of infection. The expected result is Negative. Fact Sheet for Patients:  StrictlyIdeas.no Fact Sheet for Healthcare Providers: BankingDealers.co.za This test is not yet approved or cleared by the [place] FDA and has been authorized for detection and/or diagnosis of SARS-CoV-2 by FDA under an Emergency Use Authorization (EUA).  This EUA will remain in effect (meaning this test can be used) for the duration of the COVID-19 declaration under Section 564(b)(1) of the Act, 21 U.S.C. section 360bbb-3(b)(1), unless the authorization is  terminated or revoked sooner. Performed at [facility], [place] [address] [place], [place] [address]   Lactic acid, plasma     Status: None   Collection Time: 05/24/19  6:26 PM  Result Value Ref Range   Lactic Acid, Venous 0.9 0.5 - 1.9 mmol/L    Comment: Performed at [facility] [address], [place], [place] [address]  CBC with Differential     Status: Abnormal   Collection Time: 05/24/19  6:28 PM  Result Value Ref Range   WBC 7.5 4.0 - 10.5 K/uL   RBC 2.21 (L) 3.87 - 5.11 MIL/uL   Hemoglobin 7.1 (L) 12.0 - 15.0 g/dL   HCT 23.4 (L) 36.0 - 46.0 %   MCV 105.9 (H) 80.0 - 100.0 fL   MCH 32.1  26.0 - 34.0 pg   MCHC 30.3 30.0 - 36.0 g/dL   RDW 14.6 11.5 - 15.5 %   Platelets 234 150 - 400 K/uL   nRBC 0.0 0.0 - 0.2 %   Neutrophils Relative % 64 %   Neutro Abs 4.8 1.7 - 7.7 K/uL   Lymphocytes Relative 19 %   Lymphs Abs 1.4 0.7 - 4.0 K/uL   Monocytes Relative 11 %   Monocytes Absolute 0.8 0.1 - 1.0 K/uL   Eosinophils Relative 3 %   Eosinophils Absolute 0.2 0.0 - 0.5 K/uL   Basophils Relative 0 %   Basophils Absolute 0.0 0.0 - 0.1 K/uL   Immature Granulocytes 3 %   Abs Immature Granulocytes 0.25 (H) 0.00 - 0.07 K/uL    Comment: Performed at [facility], [place] [address] [place], [place] [address]  Comprehensive metabolic panel     Status: Abnormal   Collection Time: 05/24/19  6:28 PM  Result Value Ref Range   Sodium 139 135 - 145 mmol/L   Potassium 3.5 3.5 - 5.1 mmol/L   Chloride 96 (L) 98 - 111 mmol/L   CO2 28 22 - 32 mmol/L   Glucose, Bld 130 (H) 70 - 99 mg/dL   BUN 31 (H) 6 - 20 mg/dL   Creatinine, Ser 9.24 (H) 0.44 - 1.00 mg/dL   Calcium 10.6 (H) 8.9 - 10.3 mg/dL   Total Protein 7.0 6.5 - 8.1 g/dL   Albumin 2.7 (L) 3.5 - 5.0 g/dL   AST 20 15 - 41 U/L   ALT 14 0 - 44 U/L   Alkaline Phosphatase 83 38 - 126 U/L   Total Bilirubin 0.6 0.3 - 1.2 mg/dL   GFR calc non Af Amer 5 (L) >60 mL/min   GFR calc Af Amer 6 (L) >60 mL/min   Anion gap 15 5 - 15    Comment: Performed at [facility], [place] [address] [place], [place] [address]  I-Stat Beta hCG blood, ED ([facility], [facility], [facility] only)     Status: Abnormal   Collection Time: 05/24/19  6:32 PM  Result Value Ref Range   I-stat hCG, quantitative 14.0 (H) <5 mIU/mL   Comment 3            Comment:   GEST. AGE      CONC.  (mIU/mL)   <=1 WEEK        5 - 50     2 WEEKS       50 - 500     3 WEEKS       100 - 10,000     4 WEEKS     1,000 -  30,000        FEMALE AND NON-PREGNANT FEMALE:     LESS THAN 5 mIU/mL   I-stat chem 8, ED (not at [facility] or [facility])     Status: Abnormal   Collection Time: 05/24/19  6:42 PM  Result Value Ref  Range   Sodium 136 135 - 145 mmol/L   Potassium 3.5 3.5 - 5.1 mmol/L   Chloride 98 98 - 111 mmol/L   BUN 29 (H) 6 - 20 mg/dL   Creatinine, Ser 9.20 (H) 0.44 - 1.00 mg/dL   Glucose, Bld 125 (H) 70 - 99 mg/dL   Calcium, Ion 1.25 1.15 - 1.40 mmol/L   TCO2 32 22 - 32 mmol/L   Hemoglobin 7.8 (L) 12.0 - 15.0 g/dL   HCT 23.0 (L) 36.0 - 46.0 %  Lactic acid, plasma     Status: None   Collection Time: 05/24/19  7:32 PM  Result Value Ref Range   Lactic Acid, Venous 0.9 0.5 - 1.9 mmol/L    Comment: Performed at [facility], [address] [place], [place] [address]  hCG, quantitative, pregnancy     Status: Abnormal   Collection Time: 05/24/19  7:50 PM  Result Value Ref Range   hCG, Beta Chain, Quant, S 6 (H) <5 mIU/mL    Comment:          GEST. AGE      CONC.  (mIU/mL)   <=1 WEEK        5 - 50     2 WEEKS       50 - 500     3 WEEKS       100 - 10,000     4 WEEKS     1,000 - 30,000     5 WEEKS     3,500 - 115,000   6-8 WEEKS     12,000 - 270,000    12 WEEKS     15,000 - 220,000        FEMALE AND NON-PREGNANT FEMALE:     LESS THAN 5 mIU/mL Performed at [facility], [place] [address], [place], [place] [address]   Ethanol     Status: None   Collection Time: 05/24/19  7:51 PM  Result Value Ref Range   Alcohol, Ethyl (B) <10 <10 mg/dL    Comment: (NOTE) Lowest detectable limit for serum alcohol is 10 mg/dL. For medical purposes only. Performed at [facility], [place] [address], [place], [place] [address]    Ct Head Wo Contrast  Result Date: 05/24/2019 CLINICAL DATA:  38 y/o F; altered mental status, unclear cause. Missed dialysis yesterday, lethargy and not eating per family. EXAM: CT HEAD WITHOUT CONTRAST TECHNIQUE: Contiguous axial images were obtained from the base of the skull through the vertex without intravenous contrast. COMPARISON:  10/26/2018 CT head FINDINGS: Brain: No evidence of acute infarction, hemorrhage, hydrocephalus, extra-axial collection or mass  lesion/mass effect. 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 Stable nonspecific white matter hypodensities compatible with chronic microvascular ischemic changes and stable volume loss of the brain. Vascular: Calcific atherosclerosis of the internal carotid arteries. No hyperdense vessel. Skull: Normal. Negative for fracture or focal lesion. Sinuses/Orbits: No acute finding. Other: Multiple nodules are arising from the right greater than left outer ears. IMPRESSION: 1. No acute intracranial abnormality identified. 2. Stable chronic microvascular ischemic changes and parenchymal volume loss of the brain. 3. 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 4. Multiple nodules arising from the right greater than left outer ears. Electronically Signed   By: Kristine Garbe M.D.   On: 05/24/2019 19:17   Dg Chest Port 1 View  Result Date: 05/24/2019 CLINICAL DATA:  Shortness of breath EXAM: PORTABLE CHEST 1 VIEW COMPARISON:  01/20/2019 FINDINGS: Cardiomegaly. Left upper extremity HERO type vascular graft. Both lungs are clear. The visualized skeletal structures are unremarkable. IMPRESSION: Cardiomegaly without acute abnormality of the lungs in AP portable projection. Electronically Signed   By: Eddie Candle M.D.   On: 05/24/2019 19:02    Pending Labs Unresulted Labs (From admission, onward)    Start     Ordered   05/24/19 1814  Urinalysis, Routine w reflex microscopic  Once,   STAT     05/24/19 1814   05/24/19 1814  Urine rapid drug screen (hosp performed)  ONCE - STAT,   STAT     05/24/19 1814   05/24/19 1814  Urine culture  ONCE - STAT,   STAT     05/24/19 1814          Vitals/Pain Today's Vitals   05/24/19 2045 05/24/19 2100 05/24/19 2213 05/24/19 2240  BP: 130/67 136/72 132/65   Pulse:   90   Resp: (!) 21 16 20    Temp:      TempSrc:      SpO2:   100%   Weight:      Height:      PainSc:    Asleep    Isolation  Precautions Airborne and Contact precautions  Medications Medications - No data to display  Mobility walks High fall risk   Focused Assessments Renal Assessment Handoff:  Hemodialysis Schedule: Hemodialysis Schedule: Tuesday/Thursday/Saturday Last Hemodialysis date and time: Th   Restricted appendage: left arm     R Recommendations: See Admitting Provider Note  Report given to:   Additional Notes:

## 2019-05-24 NOTE — H&P (Signed)
History and Physical   Kelly [name] ZOX:[id number] DOB: 1981/02/17 DOA: 05/24/2019  Referring MD/NP/PA: Dr. Billy Fischer  PCP: Lorelee Market, MD   Outpatient Specialists: [facility]  Patient coming from: Group home  Chief Complaint: Weakness and confusion  HPI: Kelly [name] is a 38 y.o. female with medical history significant of intellectual disability, alcohol abuse, bipolar disorder, end-stage renal disease on hemodialysis Mondays Wednesdays and Fridays, diabetes, secondary hyperparathyroidism, significant depression who was brought in from a group home with weakness, confusion, and viral-like illness.  Patient complained of significant fatigue, loss of taste and appetite but no fevers or chills.  She has not been eating or drinking normally.  Patient suspected to have acute viral illness.  COVID-19 was suspected but her test result was negative, patient has had recent fracture of the right leg also given Percocets for pain and symptoms seem to have started not long after that.  She denied any pain at this point.  Work-up so far has been uneventful but patient is weak confused and not herself.  She is being admitted for observation and possible hydration and assessment. Patient unable to answer my questions appropriately she only nods her head.  We will admit her for observation and further treatment.  ED Course: Temperature is 98.3, blood pressure is 130/72, pulse 90 respirate 21 oxygen sat 98% room air. Sodium 139 potassium 3.5 chloride 96 CO2 28 glucose 130 BUN 31 creatinine 9.24 and calcium 10.6.  Albumin is 2.7.  Lactic acid 0.9.  Hemoglobin 7.1 with white count 7.5 and platelets of 234.  Patient has elevated hCG which is 14 with a i-STAT and 6 within lab results.  Not known to have any sexual exposure and lives in the group home.  Chest x-ray head CT without contrast showed no acute findings.  We will admit her for observation and work-up.  Her alcohol level is less  than 10 and no evidence that she has any recent exposure to alcohol.  Review of Systems: As per HPI otherwise 10 point review of systems negative.    Past Medical History:  Diagnosis Date  . Alcohol abuse    chronic  . Bipolar disorder ([place])   . Chronic kidney disease   . Cognitive changes   . Cognitive impairment   . Depression   . Depression   . Diabetes mellitus without complication ([place])   . Elevated lipids   . Hyperparathyroidism ([place])   . Hypertension   . Staph aureus infection    A/V fistula  . UTI (lower urinary tract infection)     Past Surgical History:  Procedure Laterality Date  . A/V SHUNTOGRAM Left 10/07/2018   Procedure: A/V SHUNTOGRAM;  Surgeon: Katha Cabal, MD;  Location: [facility];  Service: Cardiovascular;  Laterality: Left;  . AV FISTULA PLACEMENT    . CHOLECYSTECTOMY    . dialysis perma cath Right    chest  . PERIPHERAL VASCULAR CATHETERIZATION N/A 05/24/2015   Procedure: Dialysis/Perma Catheter Removal;  Surgeon: Katha Cabal, MD;  Location: [facility];  Service: Cardiovascular;  Laterality: N/A;  . PERIPHERAL VASCULAR CATHETERIZATION Left 02/28/2016   Procedure: A/V Shuntogram/Fistulagram;  Surgeon: Katha Cabal, MD;  Location: [facility];  Service: Cardiovascular;  Laterality: Left;  . PERIPHERAL VASCULAR CATHETERIZATION N/A 02/28/2016   Procedure: A/V Shunt Intervention;  Surgeon: Katha Cabal, MD;  Location: [facility];  Service: Cardiovascular;  Laterality: N/A;  . PERIPHERAL VASCULAR CATHETERIZATION N/A  06/26/2016   Procedure: A/V Shuntogram/Fistulagram;  Surgeon: Katha Cabal, MD;  Location: [facility];  Service: Cardiovascular;  Laterality: N/A;  . PERIPHERAL VASCULAR CATHETERIZATION N/A 09/10/2016   Procedure: A/V Shuntogram/Fistulagram;  Surgeon: Algernon Huxley, MD;  Location: [facility];  Service: Cardiovascular;  Laterality: N/A;  . PERIPHERAL VASCULAR  CATHETERIZATION Left 09/14/2016   Procedure: Thrombectomy;  Surgeon: Katha Cabal, MD;  Location: [facility];  Service: Cardiovascular;  Laterality: Left;  Marland Kitchen VASCULAR ACCESS DEVICE INSERTION Left 04/22/2015   Procedure: INSERTION OF HERO VASCULAR ACCESS DEVICE;  Surgeon: Katha Cabal, MD;  Location: [facility];  Service: Vascular;  Laterality: Left;     reports that she has been smoking cigarettes. She has been smoking about 0.25 packs per day for the past 0.00 years. She uses smokeless tobacco. She reports that she does not drink alcohol or use drugs.  No Known Allergies  Family History  Family history unknown: Yes     Prior to Admission medications   Medication Sig Start Date End Date Taking? Authorizing Provider  acetaminophen (TYLENOL) 325 MG tablet Take 2 tablets (650 mg total) by mouth every 6 (six) hours as needed for mild pain, moderate pain or headache. Patient taking differently: Take 650 mg by mouth See admin instructions. Take 650 mg every Monday, Wednesday, and Friday before dialysis, may take 650 mg every 6 hours as needed for pain or headaches 10/01/16   Clapacs, Madie Reno, MD  albuterol (PROVENTIL HFA;VENTOLIN HFA) 108 (90 Base) MCG/ACT inhaler Inhale 2 puffs into the lungs every 4 (four) hours as needed for wheezing or shortness of breath.    [provider]  aspirin 81 MG chewable tablet Chew 1 tablet (81 mg total) by mouth daily. 10/02/16   Clapacs, Madie Reno, MD  B Complex-C-Folic Acid (RENA-VITE RX) 1 MG TABS Take 1 tablet by mouth daily. 10/01/16   Clapacs, Madie Reno, MD  calcium acetate (PHOSLO) 667 MG capsule Take 2 capsules (1,334 mg total) by mouth 2 (two) times daily with a meal. 10/01/16   Clapacs, Madie Reno, MD  cetaphil (CETAPHIL) lotion Apply 1 application topically daily.    [provider]  cetirizine (ZYRTEC) 10 MG tablet Take 10 mg by mouth daily.    [provider]  chlorhexidine (PERIDEX) 0.12 % solution Use as directed 15 mLs  in the mouth or throat 2 (two) times daily. Rinse for 1 min then spit 02/01/19   Wieters, [employer] C, PA-C  cinacalcet (SENSIPAR) 60 MG tablet Take 1 tablet (60 mg total) by mouth daily. 10/01/16   Clapacs, Madie Reno, MD  cloNIDine (CATAPRES) 0.1 MG tablet Take 1 tablet (0.1 mg total) by mouth 2 (two) times daily. 10/01/16   Clapacs, Madie Reno, MD  divalproex (DEPAKOTE) 500 MG DR tablet Take 2 tablets (1,000 mg total) by mouth at bedtime. Patient taking differently: Take 500-1,000 mg by mouth See admin instructions. Take 500 mg in the morning and 1000 mg at bedtime 10/01/16   Clapacs, Madie Reno, MD  docusate sodium (COLACE) 100 MG capsule Take 1 capsule (100 mg total) by mouth at bedtime. 10/01/16   Clapacs, Madie Reno, MD  erythromycin ophthalmic ointment 1 application at bedtime.    [provider]  escitalopram (LEXAPRO) 10 MG tablet Take 1.5 tablets (15 mg total) by mouth daily. Patient taking differently: Take 10 mg by mouth daily. Take with 5 mg tablet to equal 15 mg daily 10/02/16   Clapacs, Jenny Reichmann  T, MD  escitalopram (LEXAPRO) 5 MG tablet Take 5 mg by mouth daily. Takes along with 10mg  totally 15mg  daily.    [provider]  fluticasone (FLOVENT HFA) 110 MCG/ACT inhaler Inhale 2 puffs into the lungs 2 (two) times daily.    [provider]  folic acid (FOLVITE) 1 MG tablet Take 2 tablets (2 mg total) by mouth at bedtime. 10/01/16   Clapacs, Madie Reno, MD  glucagon (GLUCAGON EMERGENCY) 1 MG injection Inject 1 mg into the vein once as needed (low blood sugar).    [provider]  insulin aspart (NOVOLOG) 100 UNIT/ML injection Inject 0-15 Units into the skin 3 (three) times daily with meals. Per sliding scale     [provider]  insulin glargine (LANTUS) 100 UNIT/ML injection Inject 20 Units into the skin at bedtime.    [provider]  irbesartan (AVAPRO) 300 MG tablet Take 300 mg by mouth at bedtime.    [provider]  lamoTRIgine (LAMICTAL) 150 MG tablet  Take 150 mg by mouth 2 (two) times daily.     [provider]  lidocaine-prilocaine (EMLA) cream Apply 1 application topically 3 (three) times a week. Apply to access site 30 minutes before dialysis. Patient taking differently: Apply 1 application topically every Monday, Wednesday, and Friday. Apply to access site 30 minutes before dialysis.  10/01/16   Clapacs, Madie Reno, MD  medroxyPROGESTERone (DEPO-PROVERA) 150 MG/ML injection Inject 150 mg into the muscle every 3 (three) months.    [provider]  methylPREDNISolone acetate (DEPO-MEDROL) 40 MG/ML injection (RADIOLOGY ONLY) 40 mg once.    [provider]  metoCLOPramide (REGLAN) 10 MG tablet Take 10 mg by mouth 2 (two) times daily.     [provider]  metoprolol (LOPRESSOR) 50 MG tablet Take 1 tablet (50 mg total) by mouth 2 (two) times daily. 10/01/16   Clapacs, Madie Reno, MD  midodrine (PROAMATINE) 10 MG tablet Take 10 mg by mouth every Monday, Wednesday, and Friday. Prior to dialysis    [provider]  Multiple Vitamin (THEREMS) TABS Take 1 tablet by mouth daily. 10/01/16   Clapacs, Madie Reno, MD  oxyCODONE-acetaminophen (PERCOCET/ROXICET) 5-325 MG tablet Take 1 tablet by mouth every 6 (six) hours as needed for severe pain. 05/21/19   Horton, Barbette Hair, MD  paliperidone (INVEGA SUSTENNA) 156 MG/ML SUSY injection Inject 156 mg into the muscle See admin instructions. EVERY 28 DAYS    [provider]  pantoprazole (PROTONIX) 40 MG tablet Take 40 mg by mouth daily.    [provider]  patiromer (VELTASSA) 8.4 g packet Take 1 packet (8.4 g total) by mouth daily. Patient taking differently: Take 8.4 g by mouth daily. Mixed into 1/3 cup of drink 10/01/16   Clapacs, Madie Reno, MD  PROGESTERONE IM Inject into the muscle.    [provider]  QUEtiapine (SEROQUEL) 300 MG tablet Take 300 mg by mouth at bedtime.    [provider]  risperiDONE (RISPERDAL) 0.5 MG tablet Take 1 tablet (0.5  mg total) by mouth 2 (two) times daily. 12/26/18   Dustin Flock, MD  Tiotropium Bromide-Olodaterol (STIOLTO RESPIMAT IN) Inhale 2 puffs into the lungs daily.     [provider]  traZODone (DESYREL) 100 MG tablet Take 1 tablet (100 mg total) by mouth at bedtime. 10/01/16   Clapacs, Madie Reno, MD  vitamin B-12 (CYANOCOBALAMIN) 1000 MCG tablet Take 1 tablet (1,000 mcg total) by mouth at bedtime. 10/01/16   Clapacs, Jenny Reichmann  T, MD    Physical Exam: Vitals:   05/24/19 2030 05/24/19 2045 05/24/19 2100 05/24/19 2213  BP: 133/77 130/67 136/72 132/65  Pulse:    90  Resp: 13 (!) 21 16 20   Temp:      TempSrc:      SpO2:    100%  Weight:      Height:          Constitutional: Patient not talking but awake alert and moving also watching.  Mainly nods her head to questions.  Morbidly obese Vitals:   05/24/19 2030 05/24/19 2045 05/24/19 2100 05/24/19 2213  BP: 133/77 130/67 136/72 132/65  Pulse:    90  Resp: 13 (!) 21 16 20   Temp:      TempSrc:      SpO2:    100%  Weight:      Height:       Eyes: PERRL, lids and conjunctivae normal ENMT: Mucous membranes are moist. Posterior pharynx clear of any exudate or lesions.Normal dentition.  Neck: normal, supple, no masses, no thyromegaly Respiratory: Coarse breath sounds bilaterally, no wheezing, mild crackles. Normal respiratory effort. No accessory muscle use.  Cardiovascular: Regular rate and rhythm, no murmurs / rubs / gallops. No extremity edema. 2+ pedal pulses. No carotid bruits.  Abdomen: no tenderness, no masses palpated. No hepatosplenomegaly. Bowel sounds positive.  Musculoskeletal: no clubbing / cyanosis. No joint deformity upper and lower extremities. Good ROM, no contractures. Normal muscle tone.  Skin: no rashes, lesions, ulcers. No induration Neurologic: CN 2-12 grossly intact. Sensation intact, DTR normal. Strength 5/5 in all 4.  Psychiatric: Confused, withdrawn    Labs on Admission: I have personally reviewed following labs  and imaging studies  CBC: Recent Labs  Lab 05/24/19 1828 05/24/19 1842  WBC 7.5  --   NEUTROABS 4.8  --   HGB 7.1* 7.8*  HCT 23.4* 23.0*  MCV 105.9*  --   PLT 234  --    Basic Metabolic Panel: Recent Labs  Lab 05/24/19 1828 05/24/19 1842  NA 139 136  K 3.5 3.5  CL 96* 98  CO2 28  --   GLUCOSE 130* 125*  BUN 31* 29*  CREATININE 9.24* 9.20*  CALCIUM 10.6*  --    GFR: Estimated Creatinine Clearance: 8.5 mL/min (A) (by C-G formula based on SCr of 9.2 mg/dL (H)). Liver Function Tests: Recent Labs  Lab 05/24/19 1828  AST 20  ALT 14  ALKPHOS 83  BILITOT 0.6  PROT 7.0  ALBUMIN 2.7*   No results for input(s): LIPASE, AMYLASE in the last 168 hours. No results for input(s): AMMONIA in the last 168 hours. Coagulation Profile: No results for input(s): INR, PROTIME in the last 168 hours. Cardiac Enzymes: No results for input(s): CKTOTAL, CKMB, CKMBINDEX, TROPONINI in the last 168 hours. BNP (last 3 results) No results for input(s): PROBNP in the last 8760 hours. HbA1C: No results for input(s): HGBA1C in the last 72 hours. CBG: Recent Labs  Lab 05/24/19 1804  GLUCAP 103*   Lipid Profile: No results for input(s): CHOL, HDL, LDLCALC, TRIG, CHOLHDL, LDLDIRECT in the last 72 hours. Thyroid Function Tests: No results for input(s): TSH, T4TOTAL, FREET4, T3FREE, THYROIDAB in the last 72 hours. Anemia Panel: No results for input(s): VITAMINB12, FOLATE, FERRITIN, TIBC, IRON, RETICCTPCT in the last 72 hours. Urine analysis:    Component Value Date/Time   COLORURINE YELLOW (A) 01/18/2016 1332   APPEARANCEUR HAZY (A) 01/18/2016 1332   APPEARANCEUR HAZY 06/03/2014 2028  LABSPEC 1.008 01/18/2016 1332   LABSPEC 1.005 06/03/2014 2028   PHURINE 9.0 (H) 01/18/2016 1332   GLUCOSEU >500 (A) 01/18/2016 1332   GLUCOSEU 500 mg/dL 06/03/2014 2028   HGBUR NEGATIVE 01/18/2016 1332   BILIRUBINUR NEGATIVE 01/18/2016 1332   BILIRUBINUR NEGATIVE 06/03/2014 2028   KETONESUR NEGATIVE  01/18/2016 1332   PROTEINUR 100 (A) 01/18/2016 1332   NITRITE NEGATIVE 01/18/2016 1332   LEUKOCYTESUR TRACE (A) 01/18/2016 1332   LEUKOCYTESUR NEGATIVE 06/03/2014 2028   Sepsis Labs: @LABRCNTIP (procalcitonin:4,lacticidven:4) ) Recent Results (from the past 240 hour(s))  SARS Coronavirus 2 (CEPHEID- Performed in [place] hospital lab), Hosp Order     Status: None   Collection Time: 05/24/19  6:15 PM   Specimen: Nasopharyngeal Swab  Result Value Ref Range Status   SARS Coronavirus 2 NEGATIVE NEGATIVE Final    Comment: (NOTE) If result is NEGATIVE SARS-CoV-2 target nucleic acids are NOT DETECTED. The SARS-CoV-2 RNA is generally detectable in upper and lower  respiratory specimens during the acute phase of infection. The lowest  concentration of SARS-CoV-2 viral copies this assay can detect is 250  copies / mL. A negative result does not preclude SARS-CoV-2 infection  and should not be used as the sole basis for treatment or other  patient management decisions.  A negative result may occur with  improper specimen collection / handling, submission of specimen other  than nasopharyngeal swab, presence of viral mutation(s) within the  areas targeted by this assay, and inadequate number of viral copies  (<250 copies / mL). A negative result must be combined with clinical  observations, patient history, and epidemiological information. If result is POSITIVE SARS-CoV-2 target nucleic acids are DETECTED. The SARS-CoV-2 RNA is generally detectable in upper and lower  respiratory specimens dur ing the acute phase of infection.  Positive  results are indicative of active infection with SARS-CoV-2.  Clinical  correlation with patient history and other diagnostic information is  necessary to determine patient infection status.  Positive results do  not rule out bacterial infection or co-infection with other viruses. If result is PRESUMPTIVE POSTIVE SARS-CoV-2 nucleic acids MAY BE PRESENT.    A presumptive positive result was obtained on the submitted specimen  and confirmed on repeat testing.  While 2019 novel coronavirus  (SARS-CoV-2) nucleic acids may be present in the submitted sample  additional confirmatory testing may be necessary for epidemiological  and / or clinical management purposes  to differentiate between  SARS-CoV-2 and other Sarbecovirus currently known to infect humans.  If clinically indicated additional testing with an alternate test  methodology [phone]) is advised. The SARS-CoV-2 RNA is generally  detectable in upper and lower respiratory sp ecimens during the acute  phase of infection. The expected result is Negative. Fact Sheet for Patients:  StrictlyIdeas.no Fact Sheet for Healthcare Providers: BankingDealers.co.za This test is not yet approved or cleared by the [place] FDA and has been authorized for detection and/or diagnosis of SARS-CoV-2 by FDA under an Emergency Use Authorization (EUA).  This EUA will remain in effect (meaning this test can be used) for the duration of the COVID-19 declaration under Section 564(b)(1) of the Act, 21 U.S.C. section 360bbb-3(b)(1), unless the authorization is terminated or revoked sooner. Performed at [facility], [place] [address], [place],  [address]      Radiological Exams on Admission: Ct Head Wo Contrast  Result Date: 05/24/2019 CLINICAL DATA:  38 y/o F; altered mental status, unclear cause. Missed dialysis yesterday, lethargy and not eating  per family. EXAM: CT HEAD WITHOUT CONTRAST TECHNIQUE: Contiguous axial images were obtained from the base of the skull through the vertex without intravenous contrast. COMPARISON:  10/26/2018 CT head FINDINGS: Brain: No evidence of acute infarction, hemorrhage, hydrocephalus, extra-axial collection or mass lesion/mass effect. 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 Stable  nonspecific white matter hypodensities compatible with chronic microvascular ischemic changes and stable volume loss of the brain. Vascular: Calcific atherosclerosis of the internal carotid arteries. No hyperdense vessel. Skull: Normal. Negative for fracture or focal lesion. Sinuses/Orbits: No acute finding. Other: Multiple nodules are arising from the right greater than left outer ears. IMPRESSION: 1. No acute intracranial abnormality identified. 2. Stable chronic microvascular ischemic changes and parenchymal volume loss of the brain. 3. 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 4. Multiple nodules arising from the right greater than left outer ears. Electronically Signed   By: Kristine Garbe M.D.   On: 05/24/2019 19:17   Dg Chest Port 1 View  Result Date: 05/24/2019 CLINICAL DATA:  Shortness of breath EXAM: PORTABLE CHEST 1 VIEW COMPARISON:  01/20/2019 FINDINGS: Cardiomegaly. Left upper extremity HERO type vascular graft. Both lungs are clear. The visualized skeletal structures are unremarkable. IMPRESSION: Cardiomegaly without acute abnormality of the lungs in AP portable projection. Electronically Signed   By: Eddie Candle M.D.   On: 05/24/2019 19:02    EKG: Independently reviewed.  Sinus rhythm with a rate of 86, borderline QT interval with no significant ST changes.  Assessment/Plan Principal Problem:   Weakness generalized Active Problems:   Diabetes ([place])   Essential hypertension   ESRD on dialysis ([place])   Adjustment disorder with mixed disturbance of emotions and conduct   Mild intellectual disability   Multiple fractures of both lower extremities     #1 generalized weakness and confusion: Possibly multiple causes including medications.  No evidence of COVID but could be other viral infections.  Patient is a dialysis patient with significant anemia.  All of these could be contributing.  We will assess the patient with PT and OT.  Admit for  observation and continue close monitoring.  Check urinalysis if urine can be obtained.  She is afebrile again and no evidence of infection.  #2 end-stage renal disease: Hemodialysis on Mondays, Wednesdays and Fridays.  Continue per nephrology  #3 diabetes: Sliding scale insulin.  Continue close monitoring.  #4 anemia: Probably secondary to chronic disease with her end-stage renal disease.  Patient will likely need transfusion of PRBC.  #5 diminish intellectual activities: Probably at bedside.  Continue home regimen  #6 bipolar disorder: Again continue with home regimen.  #7 elevated hCG: Patient has no reported sexual encounters.  Not sure if this is significant.  Repeat test in the morning   DVT prophylaxis: Heparin Code Status: Full code Family Communication: Patient has a caregiver but he is not reachable tonight Disposition Plan: Back to group home maybe Consults called: Nephrology to consult in the morning Admission status: Observation  Severity of Illness: The appropriate patient status for this patient is OBSERVATION. Observation status is judged to be reasonable and necessary in order to provide the required intensity of service to ensure the patient's safety. The patient's presenting symptoms, physical exam findings, and initial radiographic and laboratory data in the context of their medical condition is felt to place them at decreased risk for further clinical deterioration. Furthermore, it is anticipated that the patient will be medically stable for discharge from the hospital within 2 midnights  of admission. The following factors support the patient status of observation.   " The patient's presenting symptoms include generalized weakness. " The physical exam findings include morbidly obese and confused. " The initial radiographic and laboratory data are elevated hCG but not sure if patient is pregnant.     Barbette Merino MD [facility] Pager [phone]  If  7PM-7AM, please contact night-coverage [email or web address] Password [facility]  05/24/2019, 11:22 PM

## 2019-05-24 NOTE — ED Notes (Signed)
Pt is anuric. 

## 2019-05-24 NOTE — ED Notes (Signed)
Attempted to call family to get information about why pt was brought to ER. No answer with numbers listed in chart

## 2019-05-25 DIAGNOSIS — E11649 Type 2 diabetes mellitus with hypoglycemia without coma: Secondary | ICD-10-CM | POA: Diagnosis present

## 2019-05-25 DIAGNOSIS — N186 End stage renal disease: Secondary | ICD-10-CM | POA: Diagnosis present

## 2019-05-25 DIAGNOSIS — G9341 Metabolic encephalopathy: Secondary | ICD-10-CM | POA: Diagnosis present

## 2019-05-25 DIAGNOSIS — Z7982 Long term (current) use of aspirin: Secondary | ICD-10-CM | POA: Diagnosis not present

## 2019-05-25 DIAGNOSIS — F4325 Adjustment disorder with mixed disturbance of emotions and conduct: Secondary | ICD-10-CM | POA: Diagnosis present

## 2019-05-25 DIAGNOSIS — Z6837 Body mass index (BMI) 37.0-37.9, adult: Secondary | ICD-10-CM | POA: Diagnosis not present

## 2019-05-25 DIAGNOSIS — E213 Hyperparathyroidism, unspecified: Secondary | ICD-10-CM | POA: Diagnosis present

## 2019-05-25 DIAGNOSIS — D631 Anemia in chronic kidney disease: Secondary | ICD-10-CM | POA: Diagnosis present

## 2019-05-25 DIAGNOSIS — F1010 Alcohol abuse, uncomplicated: Secondary | ICD-10-CM | POA: Diagnosis present

## 2019-05-25 DIAGNOSIS — E861 Hypovolemia: Secondary | ICD-10-CM | POA: Diagnosis present

## 2019-05-25 DIAGNOSIS — R531 Weakness: Secondary | ICD-10-CM | POA: Diagnosis present

## 2019-05-25 DIAGNOSIS — I120 Hypertensive chronic kidney disease with stage 5 chronic kidney disease or end stage renal disease: Secondary | ICD-10-CM | POA: Diagnosis present

## 2019-05-25 DIAGNOSIS — F70 Mild intellectual disabilities: Secondary | ICD-10-CM | POA: Diagnosis present

## 2019-05-25 DIAGNOSIS — Z794 Long term (current) use of insulin: Secondary | ICD-10-CM | POA: Diagnosis not present

## 2019-05-25 DIAGNOSIS — E1122 Type 2 diabetes mellitus with diabetic chronic kidney disease: Secondary | ICD-10-CM | POA: Diagnosis present

## 2019-05-25 DIAGNOSIS — Z992 Dependence on renal dialysis: Secondary | ICD-10-CM | POA: Diagnosis not present

## 2019-05-25 DIAGNOSIS — F319 Bipolar disorder, unspecified: Secondary | ICD-10-CM | POA: Diagnosis present

## 2019-05-25 DIAGNOSIS — Z5329 Procedure and treatment not carried out because of patient's decision for other reasons: Secondary | ICD-10-CM | POA: Diagnosis not present

## 2019-05-25 DIAGNOSIS — F17210 Nicotine dependence, cigarettes, uncomplicated: Secondary | ICD-10-CM | POA: Diagnosis present

## 2019-05-25 DIAGNOSIS — F209 Schizophrenia, unspecified: Secondary | ICD-10-CM | POA: Diagnosis present

## 2019-05-25 DIAGNOSIS — Z20828 Contact with and (suspected) exposure to other viral communicable diseases: Secondary | ICD-10-CM | POA: Diagnosis present

## 2019-05-25 DIAGNOSIS — R630 Anorexia: Secondary | ICD-10-CM | POA: Diagnosis present

## 2019-05-25 DIAGNOSIS — E871 Hypo-osmolality and hyponatremia: Secondary | ICD-10-CM | POA: Diagnosis not present

## 2019-05-25 DIAGNOSIS — N2581 Secondary hyperparathyroidism of renal origin: Secondary | ICD-10-CM | POA: Diagnosis present

## 2019-05-25 LAB — HEMOGLOBIN A1C
Hgb A1c MFr Bld: 6.3 % — ABNORMAL HIGH (ref 4.8–5.6)
Mean Plasma Glucose: 134.11 mg/dL

## 2019-05-25 LAB — CBC
HCT: 20.9 % — ABNORMAL LOW (ref 36.0–46.0)
Hemoglobin: 6.4 g/dL — CL (ref 12.0–15.0)
MCH: 31.7 pg (ref 26.0–34.0)
MCHC: 30.6 g/dL (ref 30.0–36.0)
MCV: 103.5 fL — ABNORMAL HIGH (ref 80.0–100.0)
Platelets: 235 10*3/uL (ref 150–400)
RBC: 2.02 MIL/uL — ABNORMAL LOW (ref 3.87–5.11)
RDW: 14.4 % (ref 11.5–15.5)
WBC: 5.9 10*3/uL (ref 4.0–10.5)
nRBC: 0 % (ref 0.0–0.2)

## 2019-05-25 LAB — COMPREHENSIVE METABOLIC PANEL
ALT: 11 U/L (ref 0–44)
AST: 23 U/L (ref 15–41)
Albumin: 2.4 g/dL — ABNORMAL LOW (ref 3.5–5.0)
Alkaline Phosphatase: 74 U/L (ref 38–126)
Anion gap: 15 (ref 5–15)
BUN: 36 mg/dL — ABNORMAL HIGH (ref 6–20)
CO2: 28 mmol/L (ref 22–32)
Calcium: 10.3 mg/dL (ref 8.9–10.3)
Chloride: 96 mmol/L — ABNORMAL LOW (ref 98–111)
Creatinine, Ser: 10.49 mg/dL — ABNORMAL HIGH (ref 0.44–1.00)
GFR calc Af Amer: 5 mL/min — ABNORMAL LOW (ref >60)
GFR calc non Af Amer: 4 mL/min — ABNORMAL LOW (ref >60)
Glucose, Bld: 109 mg/dL — ABNORMAL HIGH (ref 70–99)
Potassium: 3.4 mmol/L — ABNORMAL LOW (ref 3.5–5.1)
Sodium: 139 mmol/L (ref 135–145)
Total Bilirubin: 0.3 mg/dL (ref 0.3–1.2)
Total Protein: 6.2 g/dL — ABNORMAL LOW (ref 6.5–8.1)

## 2019-05-25 LAB — GLUCOSE, CAPILLARY
Glucose-Capillary: 120 mg/dL — ABNORMAL HIGH (ref 70–99)
Glucose-Capillary: 114 mg/dL — ABNORMAL HIGH (ref 70–99)
Glucose-Capillary: 105 mg/dL — ABNORMAL HIGH (ref 70–99)
Glucose-Capillary: 100 mg/dL — ABNORMAL HIGH (ref 70–99)
Glucose-Capillary: 77 mg/dL (ref 70–99)
Glucose-Capillary: 70 mg/dL (ref 70–99)

## 2019-05-25 LAB — HCG, SERUM, QUALITATIVE: Preg, Serum: NEGATIVE

## 2019-05-25 LAB — HEMOGLOBIN AND HEMATOCRIT, BLOOD
HCT: 24.6 % — ABNORMAL LOW (ref 36.0–46.0)
Hemoglobin: 8.3 g/dL — ABNORMAL LOW (ref 12.0–15.0)

## 2019-05-25 LAB — HIV ANTIBODY (ROUTINE TESTING W REFLEX): HIV Screen 4th Generation wRfx: NONREACTIVE

## 2019-05-25 LAB — PREPARE RBC (CROSSMATCH)

## 2019-05-25 LAB — ABO/RH: ABO/RH(D): A POS

## 2019-05-25 MED ORDER — PALIPERIDONE PALMITATE ER 156 MG/ML IM SUSY: 156.0000 mg | PREFILLED_SYRINGE | INTRAMUSCULAR | Status: DC

## 2019-05-25 MED ORDER — ESCITALOPRAM OXALATE 10 MG PO TABS: 10.0000 mg | ORAL_TABLET | Freq: Every day | ORAL | Status: DC

## 2019-05-25 MED ORDER — TRAZODONE HCL 50 MG PO TABS
150.0000 mg | ORAL_TABLET | Freq: Every day | ORAL | Status: DC
  Administered 2019-05-25 – 2019-05-27 (×4): 150 mg via ORAL
  Filled 2019-05-25 (×4): qty 1

## 2019-05-25 MED ORDER — ESCITALOPRAM OXALATE 10 MG PO TABS
15.0000 mg | ORAL_TABLET | Freq: Every day | ORAL | Status: DC
  Administered 2019-05-25 – 2019-06-01 (×8): 15 mg via ORAL
  Filled 2019-05-25 (×8): qty 2

## 2019-05-25 MED ORDER — CETAPHIL MOISTURIZING EX LOTN
1.0000 "application " | TOPICAL_LOTION | Freq: Every day | CUTANEOUS | Status: DC
  Administered 2019-05-25 – 2019-06-01 (×7): 1 via TOPICAL
  Filled 2019-05-25: qty 473

## 2019-05-25 MED ORDER — PATIROMER SORBITEX CALCIUM 8.4 G PO PACK
8.4000 g | PACK | Freq: Every day | ORAL | Status: DC
  Administered 2019-05-30 – 2019-05-31 (×2): 8.4 g via ORAL
  Filled 2019-05-25 (×8): qty 1

## 2019-05-25 MED ORDER — METOPROLOL TARTRATE 50 MG PO TABS
50.0000 mg | ORAL_TABLET | Freq: Two times a day (BID) | ORAL | Status: DC
  Administered 2019-05-25 – 2019-06-01 (×12): 50 mg via ORAL
  Filled 2019-05-25 (×13): qty 1

## 2019-05-25 MED ORDER — CHLORHEXIDINE GLUCONATE 0.12 % MT SOLN
15.0000 mL | Freq: Two times a day (BID) | OROMUCOSAL | Status: DC
  Administered 2019-05-25 – 2019-06-01 (×13): 15 mL via OROMUCOSAL
  Filled 2019-05-25 (×16): qty 15

## 2019-05-25 MED ORDER — ENOXAPARIN SODIUM 30 MG/0.3ML ~~LOC~~ SOLN: 30.0000 mg | SUBCUTANEOUS | Status: DC

## 2019-05-25 MED ORDER — ONDANSETRON HCL 4 MG/2ML IJ SOLN
4.0000 mg | Freq: Four times a day (QID) | INTRAMUSCULAR | Status: DC | PRN
  Administered 2019-05-28: 4 mg via INTRAVENOUS
  Filled 2019-05-25: qty 2

## 2019-05-25 MED ORDER — ADULT MULTIVITAMIN W/MINERALS CH
1.0000 | ORAL_TABLET | Freq: Every day | ORAL | Status: DC
  Administered 2019-05-25 – 2019-06-01 (×8): 1 via ORAL
  Filled 2019-05-25 (×8): qty 1

## 2019-05-25 MED ORDER — LIDOCAINE-PRILOCAINE 2.5-2.5 % EX CREA: 1.0000 "application " | TOPICAL_CREAM | CUTANEOUS | Status: DC

## 2019-05-25 MED ORDER — FOLIC ACID 1 MG PO TABS
2.0000 mg | ORAL_TABLET | Freq: Every day | ORAL | Status: DC
  Administered 2019-05-25 – 2019-05-31 (×8): 2 mg via ORAL
  Filled 2019-05-25 (×8): qty 2

## 2019-05-25 MED ORDER — RENA-VITE PO TABS
1.0000 | ORAL_TABLET | Freq: Every day | ORAL | Status: DC
  Administered 2019-05-26 – 2019-06-01 (×7): 1 via ORAL
  Filled 2019-05-25 (×8): qty 1

## 2019-05-25 MED ORDER — BUDESONIDE 0.5 MG/2ML IN SUSP
0.5000 mg | Freq: Two times a day (BID) | RESPIRATORY_TRACT | Status: DC
  Administered 2019-05-25 – 2019-06-01 (×12): 0.5 mg via RESPIRATORY_TRACT
  Filled 2019-05-25 (×14): qty 2

## 2019-05-25 MED ORDER — LORATADINE 10 MG PO TABS
10.0000 mg | ORAL_TABLET | Freq: Every day | ORAL | Status: DC
  Administered 2019-05-25 – 2019-06-01 (×8): 10 mg via ORAL
  Filled 2019-05-25 (×8): qty 1

## 2019-05-25 MED ORDER — PANTOPRAZOLE SODIUM 40 MG PO TBEC
40.0000 mg | DELAYED_RELEASE_TABLET | Freq: Every day | ORAL | Status: DC
  Administered 2019-05-26 – 2019-06-01 (×7): 40 mg via ORAL
  Filled 2019-05-25 (×8): qty 1

## 2019-05-25 MED ORDER — PALIPERIDONE PALMITATE ER 234 MG/1.5ML IM SUSY: 234.0000 mg | PREFILLED_SYRINGE | INTRAMUSCULAR | Status: DC

## 2019-05-25 MED ORDER — ACETAMINOPHEN 325 MG PO TABS: 650.0000 mg | ORAL_TABLET | Freq: Four times a day (QID) | ORAL | Status: DC | PRN

## 2019-05-25 MED ORDER — CINACALCET HCL 30 MG PO TABS
120.0000 mg | ORAL_TABLET | Freq: Every day | ORAL | Status: DC
  Administered 2019-05-26 – 2019-06-01 (×7): 120 mg via ORAL
  Filled 2019-05-25 (×9): qty 4

## 2019-05-25 MED ORDER — DIVALPROEX SODIUM 250 MG PO DR TAB
500.0000 mg | DELAYED_RELEASE_TABLET | Freq: Every day | ORAL | Status: DC
  Administered 2019-05-26 – 2019-05-27 (×2): 500 mg via ORAL
  Filled 2019-05-25 (×5): qty 2

## 2019-05-25 MED ORDER — INSULIN GLARGINE 100 UNIT/ML ~~LOC~~ SOLN
20.0000 [IU] | Freq: Every day | SUBCUTANEOUS | Status: DC
  Administered 2019-05-25 (×2): 20 [IU] via SUBCUTANEOUS
  Filled 2019-05-25 (×3): qty 0.2

## 2019-05-25 MED ORDER — ONDANSETRON HCL 4 MG PO TABS
4.0000 mg | ORAL_TABLET | Freq: Four times a day (QID) | ORAL | Status: DC | PRN
  Administered 2019-05-25: 4 mg via ORAL
  Filled 2019-05-25: qty 1

## 2019-05-25 MED ORDER — ACETAMINOPHEN 325 MG PO TABS
650.0000 mg | ORAL_TABLET | ORAL | Status: DC
  Administered 2019-05-29 – 2019-06-01 (×2): 650 mg via ORAL
  Filled 2019-05-25: qty 2

## 2019-05-25 MED ORDER — CALCIUM ACETATE (PHOS BINDER) 667 MG PO CAPS
1334.0000 mg | ORAL_CAPSULE | Freq: Two times a day (BID) | ORAL | Status: DC
  Administered 2019-05-26 – 2019-06-01 (×10): 1334 mg via ORAL
  Filled 2019-05-25 (×11): qty 2

## 2019-05-25 MED ORDER — LAMOTRIGINE 100 MG PO TABS
150.0000 mg | ORAL_TABLET | Freq: Two times a day (BID) | ORAL | Status: DC
  Administered 2019-05-25 – 2019-06-01 (×15): 150 mg via ORAL
  Filled 2019-05-25 (×16): qty 2

## 2019-05-25 MED ORDER — DIVALPROEX SODIUM 250 MG PO DR TAB
1000.0000 mg | DELAYED_RELEASE_TABLET | Freq: Every day | ORAL | Status: DC
  Administered 2019-05-25 – 2019-05-31 (×8): 1000 mg via ORAL
  Filled 2019-05-25 (×8): qty 4

## 2019-05-25 MED ORDER — DOCUSATE SODIUM 100 MG PO CAPS
100.0000 mg | ORAL_CAPSULE | Freq: Every day | ORAL | Status: DC
  Administered 2019-05-25 – 2019-05-31 (×8): 100 mg via ORAL
  Filled 2019-05-25 (×7): qty 1

## 2019-05-25 MED ORDER — SODIUM CHLORIDE 0.9% IV SOLUTION: Freq: Once | INTRAVENOUS | Status: DC

## 2019-05-25 MED ORDER — ASPIRIN 81 MG PO CHEW
81.0000 mg | CHEWABLE_TABLET | Freq: Every day | ORAL | Status: DC
  Filled 2019-05-25: qty 1

## 2019-05-25 MED ORDER — ALBUTEROL SULFATE (2.5 MG/3ML) 0.083% IN NEBU: 3.0000 mL | INHALATION_SOLUTION | RESPIRATORY_TRACT | Status: DC | PRN

## 2019-05-25 MED ORDER — ESCITALOPRAM OXALATE 10 MG PO TABS: 5.0000 mg | ORAL_TABLET | Freq: Every day | ORAL | Status: DC

## 2019-05-25 MED ORDER — OXYCODONE-ACETAMINOPHEN 5-325 MG PO TABS
1.0000 | ORAL_TABLET | Freq: Four times a day (QID) | ORAL | Status: DC | PRN
  Administered 2019-05-26 – 2019-05-31 (×6): 1 via ORAL
  Filled 2019-05-25 (×6): qty 1

## 2019-05-25 MED ORDER — CLONIDINE HCL 0.1 MG PO TABS
0.1000 mg | ORAL_TABLET | Freq: Two times a day (BID) | ORAL | Status: DC
  Administered 2019-05-25 – 2019-05-27 (×5): 0.1 mg via ORAL
  Filled 2019-05-25 (×6): qty 1

## 2019-05-25 NOTE — Progress Notes (Signed)
PROGRESS NOTE  Kelly [name] IHK:[id number] DOB: August 08, 1981 DOA: 05/24/2019 PCP: Lorelee Market, MD  Brief History   Kelly [name] is a 38 y.o. female with medical history significant of intellectual disability, alcohol abuse, bipolar disorder, end-stage renal disease on hemodialysis Mondays Wednesdays and Fridays, diabetes, secondary hyperparathyroidism, significant depression who was brought in from a group home with weakness, confusion, and viral-like illness.  Patient complained of significant fatigue, loss of taste and appetite but no fevers or chills.  She has not been eating or drinking normally.  Patient suspected to have acute viral illness.  COVID-19 was suspected but her test result was negative, patient has had recent fracture of the right leg also given Percocets for pain and symptoms seem to have started not long after that.  She denied any pain at this point.  Work-up so far has been uneventful but patient is weak confused and not herself.  She is being admitted for observation and possible hydration and assessment. Patient unable to answer my questions appropriately she only nods her head.  We will admit her for observation and further treatment.  At presentsation the patient's temperature is 98.3, blood pressure is 130/72, pulse 90 respirate 21 oxygen sat 98% room air. Sodium 139 potassium 3.5 chloride 96 CO2 28 glucose 130 BUN 31 creatinine 9.24 and calcium 10.6.  Albumin is 2.7.  Lactic acid 0.9.  Hemoglobin 7.1 with white count 7.5 and platelets of 234.  Patient has elevated hCG which is 14 with a i-STAT and 6 within lab results. Serum pregnancy test is negative. She is unable to tell me what her LMP is.  Not known to have any sexual exposure and lives in the group home. Chest x-ray head CT without contrast showed no acute findings.  We will admit her for observation and work-up.  Her alcohol level is less than 10 and no evidence that she has any recent exposure to alcohol.   Consultants  . None  Procedures  . Transfusion of 1 unit PRBC  Antibiotics  . None  Subjective  The patient is laconic which, according to nursing, is unusual for her.   Objective   Vitals:  Vitals:   05/25/19 1132 05/25/19 1420  BP: (!) 90/46 (!) 105/59  Pulse: 87 89  Resp: 18 18  Temp: 98.6 F (37 C) 98.3 F (36.8 C)  SpO2: 100% 98%    Exam:  Constitutional:  . The patient is awake, alert, and oriented x 3. No acute distress. Marland Kitchen  Respiratory:  . No increased work of breathing. . No wheezes, rales, or rhonchi. No tactile fremitus. Cardiovascular:  . Regular rate and rhythm. . No murmurs, ectopy, or gallups. . No lateral PMI. No thrills. Abdomen:  . Abdomen is soft, non-tender, non-distended. . No hernias, masses, or organomegaly. . Normoactive bowel sounds.  Musculoskeletal:  . No cyanosis, clubbing, or edema Skin:  . No rashes, lesions, ulcers . palpation of skin: no induration or nodules Neurologic:  . Unable to evaluate as the patient is unable to cooperate with exam. Psychiatric:  .  Unable to evaluate as the patient is unable to cooperate with exam.  I have personally reviewed the following:   Today's Data  . Vitals, CMP, CBC  Scheduled Meds: . sodium chloride   Intravenous Once  . acetaminophen  650 mg Oral Q M,W,F-HD  . budesonide  0.5 mg Inhalation BID  . calcium acetate  1,334 mg Oral BID WC  . cetaphil  1 application Topical Daily  .  chlorhexidine  15 mL Mouth/Throat BID  . cinacalcet  120 mg Oral Q breakfast  . cloNIDine  0.1 mg Oral BID  . divalproex  1,000 mg Oral QHS  . divalproex  500 mg Oral Daily  . docusate sodium  100 mg Oral QHS  . escitalopram  15 mg Oral Daily  . folic acid  2 mg Oral QHS  . insulin aspart  0-5 Units Subcutaneous QHS  . insulin aspart  0-9 Units Subcutaneous TID WC  . insulin glargine  20 Units Subcutaneous QHS  . lamoTRIgine  150 mg Oral BID  . loratadine  10 mg Oral Daily  . metoprolol tartrate  50  mg Oral BID  . multivitamin  1 tablet Oral Daily  . multivitamin with minerals  1 tablet Oral Daily  . pantoprazole  40 mg Oral Daily  . patiromer  8.4 g Oral Daily  . traZODone  150 mg Oral QHS   Continuous Infusions:  Principal Problem:   Weakness generalized Active Problems:   Diabetes ([place])   Essential hypertension   ESRD on dialysis ([place])   Adjustment disorder with mixed disturbance of emotions and conduct   Mild intellectual disability   Multiple fractures of both lower extremities   LOS: 0 days   A & P  Metabolic encephalopathy: DDx: Anemia, ESRD, need for dialysis.  Transfuse 1 unit PRBC's. Nephrology consutled for dialysis. FOBT. Hypotension due to hypovolemia.  Generalized weakness: DDx: Due to anemia, ESRD, Volume depletion, hypotension.  DM II: HbA1c 6.3. Glucoses are under fair control with Lantus 20 untis daily and FSBS and SSI for correction insulin. Pt is on a renal diet with a hypoglycemia protocol.  Essential Hypertension: Hypotensive on this admission. Metoprolol and clonidine have been held due to parameters. Will give IV fluid bolus.  ESRD on HD: Consult nephrology. Dialyzes MWF. Potassium, and volume low today. Not acidotic. Does not require HD to morrow. Will consult nephrology in the am. Telemetry.   Adjustment disorder with mixed disturbance of emotions and conduct: Continue depakote, lexapro, lamictal, and trazadone. Pt lives in group home.  Multiple fractures of bilateral lower extremities.  Mild intellectual disability.  I have seen and examined this patient myself. I have spent 35 minutes in her evaluation and care.  DVT prophylaxis: SCD's Code Status: Full Code Family Communication: None available Disposition Plan: Back to group home.   Esterlene Atiyeh, DO [facility] Direct contact: see [email or web address]  7PM-7AM contact night coverage as above  05/25/2019, 2:40 PM  LOS: 0 days

## 2019-05-25 NOTE — Progress Notes (Signed)
HGB 6.4 critical called from lab, Dr Benny Lennert paged

## 2019-05-26 ENCOUNTER — Ambulatory Visit: Payer: Medicaid Other | Admitting: Podiatry

## 2019-05-26 LAB — TYPE AND SCREEN
ABO/RH(D): A POS
Antibody Screen: NEGATIVE
Unit division: 0

## 2019-05-26 LAB — BPAM RBC
Blood Product Expiration Date: 202007152359
ISSUE DATE / TIME: 202006291112
Unit Type and Rh: 6200

## 2019-05-26 LAB — GLUCOSE, CAPILLARY
Glucose-Capillary: 37 mg/dL — CL (ref 70–99)
Glucose-Capillary: 52 mg/dL — ABNORMAL LOW (ref 70–99)
Glucose-Capillary: 53 mg/dL — ABNORMAL LOW (ref 70–99)
Glucose-Capillary: 153 mg/dL — ABNORMAL HIGH (ref 70–99)
Glucose-Capillary: 43 mg/dL — CL (ref 70–99)
Glucose-Capillary: 45 mg/dL — ABNORMAL LOW (ref 70–99)
Glucose-Capillary: 111 mg/dL — ABNORMAL HIGH (ref 70–99)
Glucose-Capillary: 66 mg/dL — ABNORMAL LOW (ref 70–99)
Glucose-Capillary: 70 mg/dL (ref 70–99)
Glucose-Capillary: 130 mg/dL — ABNORMAL HIGH (ref 70–99)

## 2019-05-26 LAB — CBC WITH DIFFERENTIAL/PLATELET
Abs Immature Granulocytes: 0.27 10*3/uL — ABNORMAL HIGH (ref 0.00–0.07)
Basophils Absolute: 0 10*3/uL (ref 0.0–0.1)
Basophils Relative: 1 %
Eosinophils Absolute: 0.2 10*3/uL (ref 0.0–0.5)
Eosinophils Relative: 3 %
HCT: 27 % — ABNORMAL LOW (ref 36.0–46.0)
Hemoglobin: 8.4 g/dL — ABNORMAL LOW (ref 12.0–15.0)
Immature Granulocytes: 4 %
Lymphocytes Relative: 33 %
Lymphs Abs: 2.2 10*3/uL (ref 0.7–4.0)
MCH: 30.7 pg (ref 26.0–34.0)
MCHC: 31.1 g/dL (ref 30.0–36.0)
MCV: 98.5 fL (ref 80.0–100.0)
Monocytes Absolute: 0.7 10*3/uL (ref 0.1–1.0)
Monocytes Relative: 11 %
Neutro Abs: 3.3 10*3/uL (ref 1.7–7.7)
Neutrophils Relative %: 48 %
Platelets: 250 10*3/uL (ref 150–400)
RBC: 2.74 MIL/uL — ABNORMAL LOW (ref 3.87–5.11)
RDW: 17.3 % — ABNORMAL HIGH (ref 11.5–15.5)
WBC: 6.7 10*3/uL (ref 4.0–10.5)
nRBC: 0 % (ref 0.0–0.2)

## 2019-05-26 LAB — BASIC METABOLIC PANEL
Anion gap: 17 — ABNORMAL HIGH (ref 5–15)
BUN: 42 mg/dL — ABNORMAL HIGH (ref 6–20)
CO2: 25 mmol/L (ref 22–32)
Calcium: 10.1 mg/dL (ref 8.9–10.3)
Chloride: 97 mmol/L — ABNORMAL LOW (ref 98–111)
Creatinine, Ser: 12.09 mg/dL — ABNORMAL HIGH (ref 0.44–1.00)
GFR calc Af Amer: 4 mL/min — ABNORMAL LOW (ref >60)
GFR calc non Af Amer: 4 mL/min — ABNORMAL LOW (ref >60)
Glucose, Bld: 42 mg/dL — CL (ref 70–99)
Potassium: 3.8 mmol/L (ref 3.5–5.1)
Sodium: 139 mmol/L (ref 135–145)

## 2019-05-26 MED ORDER — DEXTROSE 50 % IV SOLN
INTRAVENOUS | Status: AC
  Administered 2019-05-26: 50 mL
  Filled 2019-05-26: qty 50

## 2019-05-26 MED ORDER — DEXTROSE 10 % IV SOLN
INTRAVENOUS | Status: DC
  Administered 2019-05-26 – 2019-05-30 (×6): via INTRAVENOUS
  Filled 2019-05-26: qty 1000

## 2019-05-26 MED ORDER — DEXTROSE 50 % IV SOLN
INTRAVENOUS | Status: AC
  Administered 2019-05-26: 13:00:00
  Filled 2019-05-26: qty 50

## 2019-05-26 NOTE — Progress Notes (Signed)
PROGRESS NOTE  Kelly [name] ZDG:[id number] DOB: [birth year] DOA: 05/24/2019 PCP: Lorelee Market, MD  Brief History   Kelly [name] is a 38 y.o. female with medical history significant of intellectual disability, alcohol abuse, bipolar disorder, end-stage renal disease on hemodialysis Mondays Wednesdays and Fridays, diabetes, secondary hyperparathyroidism, significant depression who was brought in from a group home with weakness, confusion, and viral-like illness.  Patient complained of significant fatigue, loss of taste and appetite but no fevers or chills.  She has not been eating or drinking normally.  Patient suspected to have acute viral illness.  COVID-19 was suspected but her test result was negative, patient has had recent fracture of the right leg also given Percocets for pain and symptoms seem to have started not long after that.  She denied any pain at this point.  Work-up so far has been uneventful but patient is weak confused and not herself.  She is being admitted for observation and possible hydration and assessment. Patient unable to answer my questions appropriately she only nods her head.  We will admit her for observation and further treatment.  At presentsation the patient's temperature is 98.3, blood pressure is 130/72, pulse 90 respirate 21 oxygen sat 98% room air. Sodium 139 potassium 3.5 chloride 96 CO2 28 glucose 130 BUN 31 creatinine 9.24 and calcium 10.6.  Albumin is 2.7.  Lactic acid 0.9.  Hemoglobin 7.1 with white count 7.5 and platelets of 234.  Patient has elevated hCG which is 14 with a i-STAT and 6 within lab results. Serum pregnancy test is negative. She is unable to tell me what her LMP is.  Not known to have any sexual exposure and lives in the group home. Chest x-ray head CT without contrast showed no acute findings.  We will admit her for observation and work-up.  Her alcohol level is less than 10 and no evidence that she has any recent exposure to alcohol.   The patient has been hypoglycemic early this morning despite being given D50.  Consultants  . None  Procedures  . Transfusion of 1 unit PRBC  Antibiotics  . None  Subjective  The patient is somnolent, but she will open her eyes and say a few words to me.  Objective   Vitals:  Vitals:   05/26/19 0727 05/26/19 0745  BP:  119/67  Pulse:  88  Resp:  16  Temp:  98.3 F (36.8 C)  SpO2: 94% 98%    Exam:  Constitutional:  . The patient is awake, alert, and oriented x 3. No acute distress. Marland Kitchen  Respiratory:  . No increased work of breathing. . No wheezes, rales, or rhonchi. No tactile fremitus. Cardiovascular:  . Regular rate and rhythm. . No murmurs, ectopy, or gallups. . No lateral PMI. No thrills. Abdomen:  . Abdomen is soft, non-tender, non-distended. . No hernias, masses, or organomegaly. . Normoactive bowel sounds.  Musculoskeletal:  . No cyanosis, clubbing, or edema Skin:  . No rashes, lesions, ulcers . palpation of skin: no induration or nodules Neurologic:  . Unable to evaluate as the patient is unable to cooperate with exam. Psychiatric:  .  Unable to evaluate as the patient is unable to cooperate with exam.  I have personally reviewed the following:   Today's Data  . Vitals, CMP, CBC  Scheduled Meds: . sodium chloride   Intravenous Once  . acetaminophen  650 mg Oral Q M,W,F-HD  . budesonide  0.5 mg Inhalation BID  . calcium acetate  1,334  mg Oral BID WC  . cetaphil  1 application Topical Daily  . chlorhexidine  15 mL Mouth/Throat BID  . cinacalcet  120 mg Oral Q breakfast  . cloNIDine  0.1 mg Oral BID  . divalproex  1,000 mg Oral QHS  . divalproex  500 mg Oral Daily  . docusate sodium  100 mg Oral QHS  . escitalopram  15 mg Oral Daily  . folic acid  2 mg Oral QHS  . insulin aspart  0-5 Units Subcutaneous QHS  . insulin aspart  0-9 Units Subcutaneous TID WC  . lamoTRIgine  150 mg Oral BID  . loratadine  10 mg Oral Daily  . metoprolol  tartrate  50 mg Oral BID  . multivitamin  1 tablet Oral Daily  . multivitamin with minerals  1 tablet Oral Daily  . pantoprazole  40 mg Oral Daily  . patiromer  8.4 g Oral Daily  . traZODone  150 mg Oral QHS   Continuous Infusions: . dextrose      Principal Problem:   Weakness generalized Active Problems:   Diabetes ([place])   Essential hypertension   ESRD on dialysis ([place])   Adjustment disorder with mixed disturbance of emotions and conduct   Mild intellectual disability   Multiple fractures of both lower extremities   LOS: 1 day   A & P  Metabolic encephalopathy: DDx: Anemia, ESRD, need for dialysis.  Transfuse 1 unit PRBC's. Nephrology consutled for dialysis. FOBT. Hypotension due to hypovolemia. Possibly due to hypoglycemia. D 10 started and lantus has been held.  Generalized weakness: DDx: Due to anemia, ESRD, Volume depletion, hypotension.  DM II: HbA1c 6.3.  Pt is on a renal diet with a hypoglycemia protocol. Blood sugars persistently  low this morning. She has been started on D10 at 50 cc/hr. Lantus stopped. She is still on FSBS and SSI for correction insulin.  Essential Hypertension: Hypotensive on this admission. Metoprolol and clonidine have been held due to parameters. Will give IV fluid bolus.  ESRD on HD: Consult nephrology. Dialyzes MWF. Potassium, and volume low today. Not acidotic. Does not require HD to morrow. Will consult nephrology in the am. Telemetry.   Adjustment disorder with mixed disturbance of emotions and conduct: Continue depakote, lexapro, lamictal, and trazadone. Pt lives in group home.  Multiple fractures of bilateral lower extremities.  Mild intellectual disability.  I have seen and examined this patient myself. I have spent 30 minutes in her evaluation and care.  DVT prophylaxis: SCD's Code Status: Full Code Family Communication: None available Disposition Plan: Back to group home.   Rashaad Hallstrom, DO [facility] Direct contact: see  [email or web address]  7PM-7AM contact night coverage as above  05/26/2019,11:18 PM  LOS: 0 days

## 2019-05-26 NOTE — Progress Notes (Signed)
CBG 45 started feeding her lunch, she vomited all over the place, rechecked CBG 44, D50 given, notified pharmacy twice since order of D50 10%, Kelly [name] and Kelly [name] helping, doctor notified

## 2019-05-27 LAB — RENAL FUNCTION PANEL
Albumin: 2.1 g/dL — ABNORMAL LOW (ref 3.5–5.0)
Anion gap: 15 (ref 5–15)
BUN: 54 mg/dL — ABNORMAL HIGH (ref 6–20)
CO2: 26 mmol/L (ref 22–32)
Calcium: 9.7 mg/dL (ref 8.9–10.3)
Chloride: 91 mmol/L — ABNORMAL LOW (ref 98–111)
Creatinine, Ser: 13.83 mg/dL — ABNORMAL HIGH (ref 0.44–1.00)
GFR calc Af Amer: 3 mL/min — ABNORMAL LOW (ref >60)
GFR calc non Af Amer: 3 mL/min — ABNORMAL LOW (ref >60)
Glucose, Bld: 101 mg/dL — ABNORMAL HIGH (ref 70–99)
Phosphorus: 4.8 mg/dL — ABNORMAL HIGH (ref 2.5–4.6)
Potassium: 3.6 mmol/L (ref 3.5–5.1)
Sodium: 132 mmol/L — ABNORMAL LOW (ref 135–145)

## 2019-05-27 LAB — CBC WITH DIFFERENTIAL/PLATELET
Abs Immature Granulocytes: 0.2 10*3/uL — ABNORMAL HIGH (ref 0.00–0.07)
Basophils Absolute: 0.1 10*3/uL (ref 0.0–0.1)
Basophils Relative: 1 %
Eosinophils Absolute: 0.1 10*3/uL (ref 0.0–0.5)
Eosinophils Relative: 2 %
HCT: 25.6 % — ABNORMAL LOW (ref 36.0–46.0)
Hemoglobin: 8.4 g/dL — ABNORMAL LOW (ref 12.0–15.0)
Lymphocytes Relative: 22 %
Lymphs Abs: 1.3 10*3/uL (ref 0.7–4.0)
MCH: 31.7 pg (ref 26.0–34.0)
MCHC: 32.8 g/dL (ref 30.0–36.0)
MCV: 96.6 fL (ref 80.0–100.0)
Monocytes Absolute: 0.5 10*3/uL (ref 0.1–1.0)
Monocytes Relative: 9 %
Myelocytes: 4 %
Neutro Abs: 3.7 10*3/uL (ref 1.7–7.7)
Neutrophils Relative %: 62 %
Platelets: 253 10*3/uL (ref 150–400)
RBC: 2.65 MIL/uL — ABNORMAL LOW (ref 3.87–5.11)
RDW: 15.3 % (ref 11.5–15.5)
WBC: 5.9 10*3/uL (ref 4.0–10.5)
nRBC: 0 % (ref 0.0–0.2)
nRBC: 0 /100 WBC

## 2019-05-27 LAB — BASIC METABOLIC PANEL
Anion gap: 17 — ABNORMAL HIGH (ref 5–15)
BUN: 52 mg/dL — ABNORMAL HIGH (ref 6–20)
CO2: 25 mmol/L (ref 22–32)
Calcium: 9.8 mg/dL (ref 8.9–10.3)
Chloride: 90 mmol/L — ABNORMAL LOW (ref 98–111)
Creatinine, Ser: 13.54 mg/dL — ABNORMAL HIGH (ref 0.44–1.00)
GFR calc Af Amer: 4 mL/min — ABNORMAL LOW (ref >60)
GFR calc non Af Amer: 3 mL/min — ABNORMAL LOW (ref >60)
Glucose, Bld: 100 mg/dL — ABNORMAL HIGH (ref 70–99)
Potassium: 3.6 mmol/L (ref 3.5–5.1)
Sodium: 132 mmol/L — ABNORMAL LOW (ref 135–145)

## 2019-05-27 LAB — GLUCOSE, CAPILLARY
Glucose-Capillary: 101 mg/dL — ABNORMAL HIGH (ref 70–99)
Glucose-Capillary: 113 mg/dL — ABNORMAL HIGH (ref 70–99)
Glucose-Capillary: 141 mg/dL — ABNORMAL HIGH (ref 70–99)
Glucose-Capillary: 151 mg/dL — ABNORMAL HIGH (ref 70–99)
Glucose-Capillary: 160 mg/dL — ABNORMAL HIGH (ref 70–99)
Glucose-Capillary: 200 mg/dL — ABNORMAL HIGH (ref 70–99)

## 2019-05-27 MED ORDER — HEPARIN SODIUM (PORCINE) 1000 UNIT/ML DIALYSIS
1000.0000 [IU] | INTRAMUSCULAR | Status: DC | PRN
  Filled 2019-05-27: qty 1

## 2019-05-27 MED ORDER — INSULIN GLARGINE 100 UNIT/ML ~~LOC~~ SOLN
3.0000 [IU] | Freq: Every day | SUBCUTANEOUS | Status: DC
  Administered 2019-05-27: 21:00:00 3 [IU] via SUBCUTANEOUS
  Filled 2019-05-27 (×2): qty 0.03

## 2019-05-27 MED ORDER — LIDOCAINE-PRILOCAINE 2.5-2.5 % EX CREA
1.0000 "application " | TOPICAL_CREAM | CUTANEOUS | Status: DC | PRN
  Filled 2019-05-27: qty 5

## 2019-05-27 MED ORDER — CHLORHEXIDINE GLUCONATE CLOTH 2 % EX PADS
6.0000 | MEDICATED_PAD | Freq: Every day | CUTANEOUS | Status: DC
  Administered 2019-05-31 – 2019-06-01 (×2): 6 via TOPICAL

## 2019-05-27 MED ORDER — DIVALPROEX SODIUM 250 MG PO DR TAB
250.0000 mg | DELAYED_RELEASE_TABLET | Freq: Every day | ORAL | Status: DC
  Administered 2019-05-28 – 2019-05-31 (×4): 250 mg via ORAL
  Filled 2019-05-27 (×4): qty 1

## 2019-05-27 MED ORDER — SODIUM CHLORIDE 0.9 % IV SOLN: 100.0000 mL | INTRAVENOUS | Status: DC | PRN

## 2019-05-27 MED ORDER — ALTEPLASE 2 MG IJ SOLR: 2.0000 mg | Freq: Once | INTRAMUSCULAR | Status: DC | PRN

## 2019-05-27 MED ORDER — PENTAFLUOROPROP-TETRAFLUOROETH EX AERO: 1.0000 "application " | INHALATION_SPRAY | CUTANEOUS | Status: DC | PRN

## 2019-05-27 MED ORDER — LIDOCAINE HCL (PF) 1 % IJ SOLN: 5.0000 mL | INTRAMUSCULAR | Status: DC | PRN

## 2019-05-27 NOTE — Progress Notes (Addendum)
PROGRESS NOTE  Aizley Stenseth TMA:[id number] DOB: 07-May-1981 DOA: 05/24/2019 PCP: Lorelee Market, MD  Brief History   Vandana Haman is a 38 y.o. female with medical history significant of intellectual disability, alcohol abuse, bipolar disorder, end-stage renal disease on hemodialysis Mondays Wednesdays and Fridays, diabetes, secondary hyperparathyroidism, significant depression who was brought in from a group home with weakness, confusion, and viral-like illness.  Patient complained of significant fatigue, loss of taste and appetite but no fevers or chills.  She has not been eating or drinking normally.  Patient suspected to have acute viral illness.  COVID-19 was suspected but her test result was negative, patient has had recent fracture of the right leg also given Percocets for pain and symptoms seem to have started not long after that.  She denied any pain at this point.  Work-up so far has been uneventful but patient is weak confused and not herself.  She is being admitted for observation and possible hydration and assessment. Patient unable to answer my questions appropriately she only nods her head.  We will admit her for observation and further treatment.  At presentsation the patient's temperature is 98.3, blood pressure is 130/72, pulse 90 respirate 21 oxygen sat 98% room air. Sodium 139 potassium 3.5 chloride 96 CO2 28 glucose 130 BUN 31 creatinine 9.24 and calcium 10.6.  Albumin is 2.7.  Lactic acid 0.9.  Hemoglobin 7.1 with white count 7.5 and platelets of 234.  Patient has elevated hCG which is 14 with a i-STAT and 6 within lab results. Serum pregnancy test is negative. She is unable to tell me what her LMP is.  Not known to have any sexual exposure and lives in the group home. Chest x-ray head CT without contrast showed no acute findings.  We will admit her for observation and work-up.  Her alcohol level is less than 10 and no evidence that she has any recent exposure to alcohol.   The patient has been hypoglycemic yesterday morning despite being given D50. The rate was eventually increased to 100 cc an hour with improvement in her glucose. The patient is refusing PO intake, and refusing her oral medications. I have discussed the patient with Dr. Cheral Marker, neurology, who feels that as the patient does respond to sternal rub that she is simply choosing not to awaken and participate in her care or conversation. He recommended calling him back for consult in a couple of days if the patient's mental status does not improve.  I have also discussed the patient with her Hol[place]. I learned from her that the patient was refusing to eat at the group home. She also believes that it is likely that "encephalopathy" for which she was admitted was due to hypoglycemia. I told her that the patient is continuing to refuse to eat here and that we are having to give her D10 at 100 cc/hr to maintain her glucose. I explained options for feeing tubes, and Ms Marlyne Beards that the patient would never keep either an NGT or PEG tube in. Ms Marcello Moores suggested that she be able to converse with the patient over a video connection such as Zoom. She would put the question to the patient whether she wanted to start eating, or did she want to die. If the patient stated that she wanted to die, the guardian would seek out a hospice consult. I have discussed this with the patient's nurse who said that she would pass it on to case management who would  address it tomorrow.    I also told the guardian that I had decreased the patient's daytime depakote dose in order to have her be more alert. She expressed concern that this may cause the patient to express some of her less desireable behaviors. I expressed my concern that this may be causing the patient to refuse to eat. The patient is currently refusing all oral medications anyway.  Consultants  . Nephrology  Procedures  . Transfusion of 1 unit PRBC   Antibiotics  . None  Subjective  The patient is somnolent, but she does open her eyes and respond verbally when I rub her sternum. She quickly goes back to sleep. She pretends not to hear me when I try to talk to her about eating and taking her medications.  Objective   Vitals:  Vitals:   05/26/19 2355 05/27/19 0825  BP: (!) 111/38 (!) 93/55  Pulse: 81 84  Resp: 17 18  Temp: 97.7 F (36.5 C) 98.6 F (37 C)  SpO2: 97% 97%    Exam:  Constitutional:  . The patient is somnolent, but arouaseable. No acute distress. Marland Kitchen  Respiratory:  . No increased work of breathing. . No wheezes, rales, or rhonchi. No tactile fremitus. Cardiovascular:  . Regular rate and rhythm. . No murmurs, ectopy, or gallups. . No lateral PMI. No thrills. Abdomen:  . Abdomen is soft, non-tender, non-distended. . No hernias, masses, or organomegaly. . Normoactive bowel sounds.  Musculoskeletal:  . No cyanosis, clubbing, or edema Skin:  . No rashes, lesions, ulcers . palpation of skin: no induration or nodules Neurologic:  . Unable to evaluate as the patient follows commands inconsistently and frequently falls back to sleep. Psychiatric:  .  The patient is playing possum for most of my visit and exam.  I have personally reviewed the following:   Today's Data  . Vitals, CMP, CBC  Scheduled Meds: . sodium chloride   Intravenous Once  . acetaminophen  650 mg Oral Q M,W,F-HD  . budesonide  0.5 mg Inhalation BID  . calcium acetate  1,334 mg Oral BID WC  . cetaphil  1 application Topical Daily  . chlorhexidine  15 mL Mouth/Throat BID  . cinacalcet  120 mg Oral Q breakfast  . cloNIDine  0.1 mg Oral BID  . divalproex  1,000 mg Oral QHS  . divalproex  500 mg Oral Daily  . docusate sodium  100 mg Oral QHS  . escitalopram  15 mg Oral Daily  . folic acid  2 mg Oral QHS  . insulin aspart  0-5 Units Subcutaneous QHS  . insulin aspart  0-9 Units Subcutaneous TID WC  . lamoTRIgine  150 mg Oral BID  .  loratadine  10 mg Oral Daily  . metoprolol tartrate  50 mg Oral BID  . multivitamin  1 tablet Oral Daily  . multivitamin with minerals  1 tablet Oral Daily  . pantoprazole  40 mg Oral Daily  . patiromer  8.4 g Oral Daily  . traZODone  150 mg Oral QHS   Continuous Infusions: . dextrose 100 mL/hr at 05/27/19 0216    Principal Problem:   Weakness generalized Active Problems:   Diabetes ([place])   Essential hypertension   ESRD on dialysis ([place])   Adjustment disorder with mixed disturbance of emotions and conduct   Mild intellectual disability   Multiple fractures of both lower extremities   LOS: 2 days   A & P  Metabolic encephalopathy: DDx: Anemia, ESRD, need for  dialysis.  Transfuse 1 unit PRBC's. Nephrology consutled for dialysis. FOBT. Hypotension due to hypovolemia. Possibly due to hypoglycemia. D 10 started and lantus has been held. I have discussed the patient with Dr. Cheral Marker. He does not feel that a neurology consult is useful at this time. He feels that the patient is choosing not to participate in conversation or take her meds. I have reduced her daytime dose of depakote. To see if this improves her level of alertness. He does not feel that the patient's hypoglycemia has resulted in this decreased level of alertness even after the hypoglycemia has been resolved. He does however, feel that hypoglycemia at her group home is the most likely reason for the encephalopathy for which the patient has been admitted.  I have also discussed the patient with her legal guardian and I explained options for feeing tubes. Ms Marlyne Beards that the patient would never keep either an NGT or PEG tube in. Ms Marcello Moores suggested that she be able to converse with the patient over a video connection such as Zoom. She would put the question to the patient whether she wanted to start eating, or did she want to die. If the patient stated that she wanted to die, the guardian would seek out a hospice consult. I  have given this information to the patient's nurse who stated that she would share it with case management and that it would be arranged for tomorrow.  Anorexia: The patient is refusing all PO intake except for Ensure. I have explained to her that, if she does not improve her PO intake, she will need to have a feeding tube. The patient had no response to this. She will be continued on D10. Lantus will continue to be held.  Generalized weakness: DDx: Due to anorexia,  anemia, ESRD, Volume depletion, hypotension.  DM II: HbA1c 6.3.  Pt is on a renal diet with a hypoglycemia protocol. Blood sugars persistently  low this morning. She has been started on D10 at 100 cc/hr. Lantus stopped. She is still on FSBS and SSI for correction insulin. I have added back 3 units of lantus to prevent the patient from going into DKA.   Essential Hypertension: Hypotensive on this admission. Metoprolol and clonidine have been held due to parameters. She has responded to IV fluids bolus.  ESRD on HD: Consult nephrology. Dialyzes MWF. Potassium, and volume low today. Not acidotic. Does not require HD to morrow. Nephrology consulted. Telemetry.   Adjustment disorder with mixed disturbance of emotions and conduct: Continue depakote, lexapro, lamictal, and trazadone. Pt lives in group home. I have reduced the daytime dose of depakote to increase that patient's level of alertness.   Multiple fractures of bilateral lower extremities.  Mild intellectual disability.  I have seen and examined this patient myself. I have spent 48 minutes in her evaluation and care. I have spent more than 50% of this time in coordination of care with nursing and in consultation with the patient's legal guardian.  DVT prophylaxis: SCD's Code Status: Full Code Family Communication: I have discussed the patient in detail with her legal guardian [name]. All questions answered to the best of my ability. Video meeting between Ms Marcello Moores and Ms  Cimini to be arranged for tomorrow. Disposition Plan: Back to group home.   Anthonee Gelin, DO [facility] Direct contact: see [email or web address]  7PM-7AM contact night coverage as above  05/27/2019, 2:40 PM  LOS: 0 days

## 2019-05-27 NOTE — Plan of Care (Signed)
Pt is alert and oriented X2. Able to respond to questions but refuses to follow commands. Skin dry and warm. Call bell within reach. No respiratory distress is noted at this time. Will continue monitoring

## 2019-05-27 NOTE — Consult Note (Addendum)
[facility]: Reason for Consult: To manage dialysis and dialysis related needs Referring Physician: Dr Benny Lennert, Ava  HPI:  Kelly [name] is an 38 y.o. female.  With history of developmental delay, schizophrenia, hypertension, diabetes, ESRD on dialysis MWF at [facility] in [place], brought in from group home for the evaluation of worsening mental status, weakness and viral-like illness.  COVID-19 was negative.  Patient was admitted on 6/28 and she has been here since then.  She did not get dialysis on Monday.  Today we are consulted for ESRD management.  Patient was somnolent and not able to answer my question.  Most of the information obtained from the previous charts. Blood pressure is soft.  She is on room air.  Hemoglobin was 6.4 therefore received a unit of blood transfusion.  He was hypoglycemic this morning therefore currently on dextrose IV.  Evaluation for metabolic encephalopathy ongoing. She has left upper extremity AV fistula for dialysis.  Currently on PhosLo and Sensipar. Detail information about her ESRD and treatment unknown at this time as patient is encephalopathic.   Past Medical History:  Diagnosis Date  . Alcohol abuse    chronic  . Bipolar disorder ([place])   . Chronic kidney disease   . Cognitive changes   . Cognitive impairment   . Depression   . Depression   . Diabetes mellitus without complication ([place])   . Elevated lipids   . Hyperparathyroidism ([place])   . Hypertension   . Staph aureus infection    A/V fistula  . UTI (lower urinary tract infection)     Past Surgical History:  Procedure Laterality Date  . A/V SHUNTOGRAM Left 10/07/2018   Procedure: A/V SHUNTOGRAM;  Surgeon: Katha Cabal, MD;  Location: [place] [place] [facility];  Service: Cardiovascular;  Laterality: Left;  . AV FISTULA PLACEMENT    . CHOLECYSTECTOMY    . dialysis perma cath Right    chest  . PERIPHERAL VASCULAR CATHETERIZATION N/A 05/24/2015   Procedure: Dialysis/Perma  Catheter Removal;  Surgeon: Katha Cabal, MD;  Location: [place] [facility];  Service: Cardiovascular;  Laterality: N/A;  . PERIPHERAL VASCULAR CATHETERIZATION Left 02/28/2016   Procedure: A/V Shuntogram/Fistulagram;  Surgeon: Katha Cabal, MD;  Location: [place] [facility];  Service: Cardiovascular;  Laterality: Left;  . PERIPHERAL VASCULAR CATHETERIZATION N/A 02/28/2016   Procedure: A/V Shunt Intervention;  Surgeon: Katha Cabal, MD;  Location: [place];  Service: Cardiovascular;  Laterality: N/A;  . PERIPHERAL VASCULAR CATHETERIZATION N/A 06/26/2016   Procedure: A/V Shuntogram/Fistulagram;  Surgeon: Katha Cabal, MD;  Location: [place] [facility];  Service: Cardiovascular;  Laterality: N/A;  . PERIPHERAL VASCULAR CATHETERIZATION N/A 09/10/2016   Procedure: A/V Shuntogram/Fistulagram;  Surgeon: Algernon Huxley, MD;  Location: [place] [facility];  Service: Cardiovascular;  Laterality: N/A;  . PERIPHERAL VASCULAR CATHETERIZATION Left 09/14/2016   Procedure: Thrombectomy;  Surgeon: Katha Cabal, MD;  Location: [place] [facility];  Service: Cardiovascular;  Laterality: Left;  Marland Kitchen VASCULAR ACCESS DEVICE INSERTION Left 04/22/2015   Procedure: INSERTION OF HERO VASCULAR ACCESS DEVICE;  Surgeon: Katha Cabal, MD;  Location: [facility];  Service: Vascular;  Laterality: Left;    Family History  Family history unknown: Yes    Social History:  reports that she has been smoking cigarettes. She has been smoking about 0.25 packs per day for the past 0.00 years. She uses smokeless tobacco. She reports that she does not drink alcohol or use drugs.  Allergies:  No Known Allergies  Medications: I have reviewed the patient's current medications.   Results for orders placed or performed during the hospital encounter of 05/24/19 (from the past 48 hour(s))  Hemoglobin and hematocrit, blood     Status: Abnormal   Collection Time: 05/25/19  3:15 PM  Result Value Ref Range    Hemoglobin 8.3 (L) 12.0 - 15.0 g/dL    Comment: REPEATED TO VERIFY POST TRANSFUSION SPECIMEN    HCT 24.6 (L) 36.0 - 46.0 %    Comment: Performed at [facility] [address] [place], [place] [address]  Glucose, capillary     Status: None   Collection Time: 05/25/19  4:29 PM  Result Value Ref Range   Glucose-Capillary 77 70 - 99 mg/dL  Glucose, capillary     Status: None   Collection Time: 05/25/19 10:26 PM  Result Value Ref Range   Glucose-Capillary 70 70 - 99 mg/dL   Comment 1 Notify RN    Comment 2 Document in Chart   CBC with Differential/Platelet     Status: Abnormal   Collection Time: 05/26/19  5:09 AM  Result Value Ref Range   WBC 6.7 4.0 - 10.5 K/uL   RBC 2.74 (L) 3.87 - 5.11 MIL/uL   Hemoglobin 8.4 (L) 12.0 - 15.0 g/dL   HCT 27.0 (L) 36.0 - 46.0 %   MCV 98.5 80.0 - 100.0 fL   MCH 30.7 26.0 - 34.0 pg   MCHC 31.1 30.0 - 36.0 g/dL   RDW 17.3 (H) 11.5 - 15.5 %   Platelets 250 150 - 400 K/uL    Comment: REPEATED TO VERIFY   nRBC 0.0 0.0 - 0.2 %   Neutrophils Relative % 48 %   Neutro Abs 3.3 1.7 - 7.7 K/uL   Lymphocytes Relative 33 %   Lymphs Abs 2.2 0.7 - 4.0 K/uL   Monocytes Relative 11 %   Monocytes Absolute 0.7 0.1 - 1.0 K/uL   Eosinophils Relative 3 %   Eosinophils Absolute 0.2 0.0 - 0.5 K/uL   Basophils Relative 1 %   Basophils Absolute 0.0 0.0 - 0.1 K/uL   Immature Granulocytes 4 %   Abs Immature Granulocytes 0.27 (H) 0.00 - 0.07 K/uL    Comment: Performed at [facility], [place] [address], [place], [place] [address]  Basic metabolic panel     Status: Abnormal   Collection Time: 05/26/19  5:09 AM  Result Value Ref Range   Sodium 139 135 - 145 mmol/L   Potassium 3.8 3.5 - 5.1 mmol/L   Chloride 97 (L) 98 - 111 mmol/L   CO2 25 22 - 32 mmol/L   Glucose, Bld 42 (LL) 70 - 99 mg/dL    Comment: CRITICAL RESULT CALLED TO, READ BACK BY AND VERIFIED WITH: T ABIDOYE,RN [id number] WIL[address]K    BUN 42 (H) 6 - 20 mg/dL   Creatinine, Ser 12.09 (H) 0.44 -  1.00 mg/dL   Calcium 10.1 8.9 - 10.3 mg/dL   GFR calc non Af Amer 4 (L) >60 mL/min   GFR calc Af Amer 4 (L) >60 mL/min   Anion gap 17 (H) 5 - 15    Comment: Performed at [facility], [place] [address], [place], [place] [address]  Glucose, capillary     Status: Abnormal   Collection Time: 05/26/19  6:21 AM  Result Value Ref Range   Glucose-Capillary 37 (LL) 70 - 99 mg/dL   Comment 1 Notify RN  Glucose, capillary     Status: Abnormal   Collection Time: 05/26/19  6:58 AM  Result Value Ref Range   Glucose-Capillary 52 (L) 70 - 99 mg/dL  Glucose, capillary     Status: Abnormal   Collection Time: 05/26/19  7:20 AM  Result Value Ref Range   Glucose-Capillary 53 (L) 70 - 99 mg/dL  Glucose, capillary     Status: Abnormal   Collection Time: 05/26/19  7:55 AM  Result Value Ref Range   Glucose-Capillary 153 (H) 70 - 99 mg/dL  Glucose, capillary     Status: Abnormal   Collection Time: 05/26/19 12:13 PM  Result Value Ref Range   Glucose-Capillary 45 (L) 70 - 99 mg/dL  Glucose, capillary     Status: Abnormal   Collection Time: 05/26/19 12:27 PM  Result Value Ref Range   Glucose-Capillary 43 (LL) 70 - 99 mg/dL  Glucose, capillary     Status: Abnormal   Collection Time: 05/26/19 12:47 PM  Result Value Ref Range   Glucose-Capillary 111 (H) 70 - 99 mg/dL  Glucose, capillary     Status: Abnormal   Collection Time: 05/26/19  5:41 PM  Result Value Ref Range   Glucose-Capillary 66 (L) 70 - 99 mg/dL   Comment 1 Notify RN    Comment 2 Document in Chart   Glucose, capillary     Status: None   Collection Time: 05/26/19  6:16 PM  Result Value Ref Range   Glucose-Capillary 70 70 - 99 mg/dL   Comment 1 Notify RN    Comment 2 Document in Chart   Glucose, capillary     Status: Abnormal   Collection Time: 05/26/19  8:16 PM  Result Value Ref Range   Glucose-Capillary 130 (H) 70 - 99 mg/dL   Comment 1 Notify RN   Glucose, capillary     Status: Abnormal   Collection Time: 05/26/19 11:47 PM   Result Value Ref Range   Glucose-Capillary 160 (H) 70 - 99 mg/dL   Comment 1 Notify RN   Glucose, capillary     Status: Abnormal   Collection Time: 05/27/19  4:21 AM  Result Value Ref Range   Glucose-Capillary 141 (H) 70 - 99 mg/dL  Glucose, capillary     Status: Abnormal   Collection Time: 05/27/19 12:37 PM  Result Value Ref Range   Glucose-Capillary 151 (H) 70 - 99 mg/dL    No results found.  ROS: Unable to obtain because of her encephalopathy. Blood pressure (!) 102/39, pulse 87, temperature 98.7 F (37.1 C), temperature source Oral, resp. rate 18, height 5\' 1"  (1.549 m), weight 88.5 kg, SpO2 100 %. General exam: Comfortable but not answering to questions Respiratory system: Clear to auscultation. Respiratory effort normal. No wheezing or crackle Cardiovascular system: S1 & S2 heard, RRR.  No pedal edema. Gastrointestinal system: Abdomen is soft and nontender. Normal bowel sounds heard. Central nervous system: Alert but not answering question.  Looks somnolent Extremities: No edema, no cyanosis Skin: No rashes, lesions or ulcers Psychiatry: Unable to judge Left upper extremity, arm AV fistula.  Assessment/Plan: 1 Generalized weakness and encephalopathy: Likely due to hypoglycemia and anemia.  Further evaluation per primary medicine team. CT head with no acute finding.  2 ESRD: MWF at [facility]: From the chart patient did not get dialysis this Monday.  Plan for HD today.  Left upper extremity aVF for the access.  We will try to obtain dialysis treatment formation from her center.  3 Hypertension/volume: Blood pressure  is soft.  I will discontinue clonidine.  On metoprolol.  No lower extremity edema.  4. Anemia of ESRD: Received a unit of blood transfusion.  Order iron studies.  Monitor CBC.  5. Metabolic Bone Disease: Currently on PhosLo and Sensipar.  Check lab.  Patient was hard to stick therefore difficult to obtain lab test.  We will check labs at  dialysis.  Taja Pentland Tanna Furry 05/27/2019, 1:13 PM

## 2019-05-28 LAB — CBC WITH DIFFERENTIAL/PLATELET
Abs Immature Granulocytes: 0.47 10*3/uL — ABNORMAL HIGH (ref 0.00–0.07)
Basophils Absolute: 0.1 10*3/uL (ref 0.0–0.1)
Basophils Relative: 1 %
Eosinophils Absolute: 0.2 10*3/uL (ref 0.0–0.5)
Eosinophils Relative: 3 %
HCT: 27.1 % — ABNORMAL LOW (ref 36.0–46.0)
Hemoglobin: 8.7 g/dL — ABNORMAL LOW (ref 12.0–15.0)
Immature Granulocytes: 7 %
Lymphocytes Relative: 22 %
Lymphs Abs: 1.5 10*3/uL (ref 0.7–4.0)
MCH: 31.3 pg (ref 26.0–34.0)
MCHC: 32.1 g/dL (ref 30.0–36.0)
MCV: 97.5 fL (ref 80.0–100.0)
Monocytes Absolute: 0.7 10*3/uL (ref 0.1–1.0)
Monocytes Relative: 11 %
Neutro Abs: 4 10*3/uL (ref 1.7–7.7)
Neutrophils Relative %: 56 %
Platelets: 257 10*3/uL (ref 150–400)
RBC: 2.78 MIL/uL — ABNORMAL LOW (ref 3.87–5.11)
RDW: 14.9 % (ref 11.5–15.5)
WBC: 7 10*3/uL (ref 4.0–10.5)
nRBC: 0 % (ref 0.0–0.2)

## 2019-05-28 LAB — BASIC METABOLIC PANEL
Anion gap: 14 (ref 5–15)
BUN: 20 mg/dL (ref 6–20)
CO2: 27 mmol/L (ref 22–32)
Calcium: 10 mg/dL (ref 8.9–10.3)
Chloride: 93 mmol/L — ABNORMAL LOW (ref 98–111)
Creatinine, Ser: 7.56 mg/dL — ABNORMAL HIGH (ref 0.44–1.00)
GFR calc Af Amer: 7 mL/min — ABNORMAL LOW (ref >60)
GFR calc non Af Amer: 6 mL/min — ABNORMAL LOW (ref >60)
Glucose, Bld: 166 mg/dL — ABNORMAL HIGH (ref 70–99)
Potassium: 3.5 mmol/L (ref 3.5–5.1)
Sodium: 134 mmol/L — ABNORMAL LOW (ref 135–145)

## 2019-05-28 LAB — GLUCOSE, CAPILLARY
Glucose-Capillary: 140 mg/dL — ABNORMAL HIGH (ref 70–99)
Glucose-Capillary: 162 mg/dL — ABNORMAL HIGH (ref 70–99)
Glucose-Capillary: 155 mg/dL — ABNORMAL HIGH (ref 70–99)
Glucose-Capillary: 126 mg/dL — ABNORMAL HIGH (ref 70–99)
Glucose-Capillary: 176 mg/dL — ABNORMAL HIGH (ref 70–99)

## 2019-05-28 LAB — PTH, INTACT AND CALCIUM
Calcium, Total (PTH): 9.6 mg/dL (ref 8.7–10.2)
PTH: 164 pg/mL — ABNORMAL HIGH (ref 15–65)

## 2019-05-28 LAB — IRON AND TIBC
Iron: 34 ug/dL (ref 28–170)
Saturation Ratios: 20 % (ref 10.4–31.8)
TIBC: 171 ug/dL — ABNORMAL LOW (ref 250–450)
UIBC: 137 ug/dL

## 2019-05-28 MED ORDER — CHLORHEXIDINE GLUCONATE CLOTH 2 % EX PADS
6.0000 | MEDICATED_PAD | Freq: Every day | CUTANEOUS | Status: DC
  Administered 2019-05-31 – 2019-06-01 (×2): 6 via TOPICAL

## 2019-05-28 MED ORDER — ENSURE ENLIVE PO LIQD
237.0000 mL | Freq: Three times a day (TID) | ORAL | Status: DC
  Administered 2019-05-28 – 2019-05-31 (×8): 237 mL via ORAL

## 2019-05-28 MED ORDER — TRAZODONE HCL 50 MG PO TABS
50.0000 mg | ORAL_TABLET | Freq: Every day | ORAL | Status: DC
  Administered 2019-05-28 – 2019-05-30 (×3): 50 mg via ORAL
  Filled 2019-05-28 (×3): qty 1

## 2019-05-28 MED ORDER — PRO-STAT SUGAR FREE PO LIQD
30.0000 mL | Freq: Two times a day (BID) | ORAL | Status: DC
  Administered 2019-05-28 – 2019-06-01 (×8): 30 mL via ORAL
  Filled 2019-05-28 (×7): qty 30

## 2019-05-28 NOTE — Plan of Care (Signed)
Progressing towards goals

## 2019-05-28 NOTE — Progress Notes (Signed)
Initial Nutrition Assessment   RD working remotely.  DOCUMENTATION CODES:   Obesity unspecified  INTERVENTION:  Provide Ensure Enlive po TID, each supplement provides 350 kcal and 20 grams of protein.  Provide 30 ml Prostat po BID, each supplement provides 100 kcal and 15 grams of protein.   Encourage adequate PO intake.   NUTRITION DIAGNOSIS:   Increased nutrient needs related to chronic illness(ESRD on HD) as evidenced by estimated needs.  GOAL:   Patient will meet greater than or equal to 90% of their needs  MONITOR:   PO intake, Supplement acceptance, Skin, Weight trends, Labs, I & O's  REASON FOR ASSESSMENT:   Low [place]    ASSESSMENT:   38 y.o. female with medical history significant of intellectual disability, alcohol abuse, bipolar disorder, end-stage renal disease on hemodialysis Mondays Wednesdays and Fridays, diabetes, secondary hyperparathyroidism, significant depression who was brought in from a group home with weakness, confusion, and viral-like illness.  RD unable to obtain nutrition history during attempted time of contact. Per MD, pt with poor/refusal of po intake. Pt currently on IV dextrose. No significant weight loss per weight records. Plans to discuss goals of care with pt and legal guardian. Legal guardian reports pt would not keep NGT or PEG tube in. MD reports pt is consuming Ensure. RD to order Ensure as well as in Prostat to aid in caloric and protein needs. Will continue to monitor.   Unable to complete Nutrition-Focused physical exam at this time.   Labs and medications reviewed. Phosphorous elevated at 4.8.  Diet Order:   Diet Order            Diet renal with fluid restriction Fluid restriction: 1200 mL Fluid; Room service appropriate? Yes; Fluid consistency: Thin  Diet effective now              EDUCATION NEEDS:   Not appropriate for education at this time  Skin:  Skin Assessment: Reviewed RN Assessment  Last BM:   Unknown  Height:   Ht Readings from Last 1 Encounters:  05/24/19 5\' 1"  (1.549 m)    Weight:   Wt Readings from Last 1 Encounters:  05/28/19 89.8 kg    Ideal Body Weight:  47.7 kg  BMI:  Body mass index is 37.41 kg/m.  Estimated Nutritional Needs:   Kcal:  1800-2000  Protein:  100-115 grams  Fluid:  1.2 L/day    Corrin Parker, MS, RD, LDN Pager # [phone] After hours/ weekend pager # [phone]

## 2019-05-28 NOTE — Progress Notes (Signed)
PROGRESS NOTE    Kelly [name]  YIR:[id number] DOB: 09-14-1981 DOA: 05/24/2019 PCP: Lorelee Market, MD   Brief Narrative: 38 y.o.femalewith medical history significant ofintellectual disability, alcohol abuse, bipolar disorder, end-stage renal disease on hemodialysis Mondays Wednesdays and Fridays, diabetes, secondary hyperparathyroidism, significant depression who was brought in from a group home with weakness, confusion, and viral-like illness. Patient complained of significant fatigue, loss of taste and appetite but no fevers or chills. She has not been eating or drinking normally. Patient suspected to have acute viral illness. COVID-19 was suspected but her test result was negative, patient has had recent fracture of the right leg also given Percocets for pain and symptoms seem to have started not long after that. She denied any pain at this point. Work-up so far has been uneventful but patient is weak confused and not herself. She is being admitted for observation and possible hydration and assessment. Patient unable to answer my questions appropriately she only nods her head.We will admit her for observation and further treatment.  At presentsation the patient's temperature is 98.3, blood pressure is 130/72, pulse 90 respirate 21 oxygen sat 98% room air.Sodium 139 potassium 3.5 chloride 96 CO2 28glucose 130 BUN 31 creatinine 9.24 and calcium 10.6. Albumin is 2.7. Lactic acid 0.9. Hemoglobin 7.1 with white count 7.5 and platelets of 234. Patient has elevated hCG which is 14 with a i-STAT and 6 within lab results. Serum pregnancy test is negative. She is unable to tell me what her LMP is. Not known to have any sexual exposure and lives in the group home.Chest x-ray head CT without contrast showed no acute findings. We will admit her for observation and work-up. Her alcohol level is less than 10 and no evidence that she has any recent exposure to alcohol.  The patient has  been hypoglycemic yesterday morning despite being given D50. The rate was eventually increased to 100 cc an hour with improvement in her glucose. The patient is refusing PO intake, and refusing her oral medications. I have discussed the patient with Dr. Cheral Marker, neurology, who feels that as the patient does respond to sternal rub that she is simply choosing not to awaken and participate in her care or conversation. He recommended calling him back for consult in a couple of days if the patient's mental status does not improve.   I have also discussed the patient with her [place]. I learned from her that the patient was refusing to eat at the group home. She also believes that it is likely that "encephalopathy" for which she was admitted was due to hypoglycemia. I told her that the patient is continuing to refuse to eat here and that we are having to give her D10 at 100 cc/hr to maintain her glucose. I explained options for feeing tubes, and Ms Marlyne Beards that the patient would never keep either an NGT or PEG tube in. Ms Marcello Moores suggested that she be able to converse with the patient over a video connection such as Zoom. She would put the question to the patient whether she wanted to start eating, or did she want to die. If the patient stated that she wanted to die, the guardian would seek out a hospice consult. I have discussed this with the patient's nurse who said that she would pass it on to case management who would address it tomorrow.    I also told the guardian that I had decreased the patient's daytime depakote dose in order to have  her be more alert. She expressed concern that this may cause the patient to express some of her less desireable behaviors. I expressed my concern that this may be causing the patient to refuse to eat. The patient is currently refusing all oral medications anyway.  Assessment & Plan:   Principal Problem:   Weakness generalized Active Problems:    Diabetes ([place])   Essential hypertension   ESRD on dialysis ([place])   Adjustment disorder with mixed disturbance of emotions and conduct   Mild intellectual disability   Multiple fractures of both lower extremities  Metabolic encephalopathy: Possibly secondary to hypoglycemia \\lantus  has been held.  Still on D5 IV since patient refusing to eat.  I have discussed the patient with Dr. Cheral Marker. He does not feel that a neurology consult is useful at this time. He feels that the patient is choosing not to participate in conversation or take her meds. I have reduced her daytime dose of depakote.  I have also decreased her dose of trazodone at night.  To see if this improves her level of alertness. He does not feel that the patient's hypoglycemia has resulted in this decreased level of alertness even after the hypoglycemia has been resolved. He does however, feel that hypoglycemia at her group home is the most likely reason for the encephalopathy for which the patient has been admitted.  Legal guardian to call in the patient today and ask her if she wants to live or die.  If she opts to diet then she want the patient to see hospice.  Anorexia: The patient is refusing all PO intake except for Ensure. I have explained to her that, if she does not improve her PO intake, she will need to have a feeding tube. The patient had no response to this. She will be continued on D10. Lantus will continue to be held.  Generalized weakness: DDx: Due to anorexia,  anemia, ESRD, Volume depletion, hypotension.  DM II: HbA1c 6.3.  Pt is on a renal diet with a hypoglycemia protocol. Blood sugars persistently  low this morning. She has been started on D10 at 100 cc/hr. Lantus stopped. She is still on FSBS and SSI for correction insulin.  Essential Hypertension: Hypotensive on this admission. Metoprolol and clonidine have been held due to parameters. She has responded to IV fluids bolus.  ESRD on HD:. Dialyzes MWF   Adjustment disorder with mixed disturbance of emotions and conduct: Continue depakote, lexapro, lamictal, and trazadone. Pt lives in group home. I have reduced the daytime dose of depakote to increase that patient's level of alertness.   Multiple fractures of bilateral lower extremities.  Mild intellectual disability.  DVT prophylaxis: SCD's Code Status: Full Code Family Communication: None Disposition Plan: Back to group home.      Nutrition Problem: Increased nutrient needs Etiology: chronic illness(ESRD on HD)     Signs/Symptoms: estimated needs    Interventions: Ensure Enlive (each supplement provides 350kcal and 20 grams of protein)  Estimated body mass index is 37.41 kg/m as calculated from the following:   Height as of this encounter: 5\' 1"  (1.549 m).   Weight as of this encounter: 89.8 kg.   Subjective: Patient sleeping when I called her name she opens her eyes looks at me trying to communicate by nodding her head up and down  Objective: Vitals:   05/27/19 2339 05/28/19 0455 05/28/19 0826 05/28/19 0843  BP: 112/63 (!) 108/44 (!) 100/52   Pulse: 78 96 92 99  Resp: 17 17  18  Temp: 98.6 F (37 C) 98.9 F (37.2 C) 98.7 F (37.1 C)   TempSrc: Oral Oral Oral   SpO2: 97% 100% 100% 92%  Weight:  89.8 kg    Height:        Intake/Output Summary (Last 24 hours) at 05/28/2019 1250 Last data filed at 05/28/2019 1249 Gross per 24 hour  Intake 1896.99 ml  Output 1512 ml  Net 384.99 ml   Filed Weights   05/27/19 1415 05/27/19 1728 05/28/19 0455  Weight: 90.3 kg 89.9 kg 89.8 kg    Examination:  General exam: Appears calm and comfortable  Respiratory system: Clear to auscultation. Respiratory effort normal. Cardiovascular system: S1 & S2 heard, RRR. No JVD, murmurs, rubs, gallops or clicks. No pedal edema. Gastrointestinal system: Abdomen is nondistended, soft and nontender. No organomegaly or masses felt. Normal bowel sounds heard. Central nervous  systemRESPONDS TO pain  Extremities 1+ pitting edema Skin: No rashes, lesions or ulcers  Data Reviewed: I have personally reviewed following labs and imaging studies  CBC: Recent Labs  Lab 05/24/19 1828  05/25/19 0756 05/25/19 1515 05/26/19 0509 05/27/19 1445 05/28/19 0424  WBC 7.5  --  5.9  --  6.7 5.9 7.0  NEUTROABS 4.8  --   --   --  3.3 3.7 4.0  HGB 7.1*   < > 6.4* 8.3* 8.4* 8.4* 8.7*  HCT 23.4*   < > 20.9* 24.6* 27.0* 25.6* 27.1*  MCV 105.9*  --  103.5*  --  98.5 96.6 97.5  PLT 234  --  235  --  250 253 257   < > = values in this interval not displayed.   Basic Metabolic Panel: Recent Labs  Lab 05/24/19 1828 05/24/19 1842 05/25/19 0756 05/26/19 0509 05/27/19 1445 05/28/19 0424  NA 139 136 139 139 132*  132* 134*  K 3.5 3.5 3.4* 3.8 3.6  3.6 3.5  CL 96* 98 96* 97* 91*  90* 93*  CO2 28  --  28 25 26  25 27   GLUCOSE 130* 125* 109* 42* 101*  100* 166*  BUN 31* 29* 36* 42* 54*  52* 20  CREATININE 9.24* 9.20* 10.49* 12.09* 13.83*  13.54* 7.56*  CALCIUM 10.6*  --  10.3 10.1 9.7  9.8  9.6 10.0  PHOS  --   --   --   --  4.8*  --    GFR: Estimated Creatinine Clearance: 10.4 mL/min (A) (by C-G formula based on SCr of 7.56 mg/dL (H)). Liver Function Tests: Recent Labs  Lab 05/24/19 1828 05/25/19 0756 05/27/19 1445  AST 20 23  --   ALT 14 11  --   ALKPHOS 83 74  --   BILITOT 0.6 0.3  --   PROT 7.0 6.2*  --   ALBUMIN 2.7* 2.4* 2.1*   No results for input(s): LIPASE, AMYLASE in the last 168 hours. No results for input(s): AMMONIA in the last 168 hours. Coagulation Profile: No results for input(s): INR, PROTIME in the last 168 hours. Cardiac Enzymes: No results for input(s): CKTOTAL, CKMB, CKMBINDEX, TROPONINI in the last 168 hours. BNP (last 3 results) No results for input(s): PROBNP in the last 8760 hours. HbA1C: No results for input(s): HGBA1C in the last 72 hours. CBG: Recent Labs  Lab 05/27/19 1951 05/27/19 2337 05/28/19 0456 05/28/19 0803  05/28/19 1152  GLUCAP 113* 200* 140* 162* 155*   Lipid Profile: No results for input(s): CHOL, HDL, LDLCALC, TRIG, CHOLHDL, LDLDIRECT in the last 72 hours.  Thyroid Function Tests: No results for input(s): TSH, T4TOTAL, FREET4, T3FREE, THYROIDAB in the last 72 hours. Anemia Panel: Recent Labs    05/28/19 0424  TIBC 171*  IRON 34   Sepsis Labs: Recent Labs  Lab 05/24/19 1826 05/24/19 1932  LATICACIDVEN 0.9 0.9    Recent Results (from the past 240 hour(s))  SARS Coronavirus 2 (CEPHEID- Performed in [place] hospital lab), Hosp Order     Status: None   Collection Time: 05/24/19  6:15 PM   Specimen: Nasopharyngeal Swab  Result Value Ref Range Status   SARS Coronavirus 2 NEGATIVE NEGATIVE Final    Comment: (NOTE) If result is NEGATIVE SARS-CoV-2 target nucleic acids are NOT DETECTED. The SARS-CoV-2 RNA is generally detectable in upper and lower  respiratory specimens during the acute phase of infection. The lowest  concentration of SARS-CoV-2 viral copies this assay can detect is 250  copies / mL. A negative result does not preclude SARS-CoV-2 infection  and should not be used as the sole basis for treatment or other  patient management decisions.  A negative result may occur with  improper specimen collection / handling, submission of specimen other  than nasopharyngeal swab, presence of viral mutation(s) within the  areas targeted by this assay, and inadequate number of viral copies  (<250 copies / mL). A negative result must be combined with clinical  observations, patient history, and epidemiological information. If result is POSITIVE SARS-CoV-2 target nucleic acids are DETECTED. The SARS-CoV-2 RNA is generally detectable in upper and lower  respiratory specimens dur ing the acute phase of infection.  Positive  results are indicative of active infection with SARS-CoV-2.  Clinical  correlation with patient history and other diagnostic information is  necessary to  determine patient infection status.  Positive results do  not rule out bacterial infection or co-infection with other viruses. If result is PRESUMPTIVE POSTIVE SARS-CoV-2 nucleic acids MAY BE PRESENT.   A presumptive positive result was obtained on the submitted specimen  and confirmed on repeat testing.  While 2019 novel coronavirus  (SARS-CoV-2) nucleic acids may be present in the submitted sample  additional confirmatory testing may be necessary for epidemiological  and / or clinical management purposes  to differentiate between  SARS-CoV-2 and other Sarbecovirus currently known to infect humans.  If clinically indicated additional testing with an alternate test  methodology [phone]) is advised. The SARS-CoV-2 RNA is generally  detectable in upper and lower respiratory sp ecimens during the acute  phase of infection. The expected result is Negative. Fact Sheet for Patients:  StrictlyIdeas.no Fact Sheet for Healthcare Providers: BankingDealers.co.za This test is not yet approved or cleared by the [place] FDA and has been authorized for detection and/or diagnosis of SARS-CoV-2 by FDA under an Emergency Use Authorization (EUA).  This EUA will remain in effect (meaning this test can be used) for the duration of the COVID-19 declaration under Section 564(b)(1) of the Act, 21 U.S.C. section 360bbb-3(b)(1), unless the authorization is terminated or revoked sooner. Performed at [facility], [place] [address], [place], [place] [address]          Radiology Studies: No results found.      Scheduled Meds: . sodium chloride   Intravenous Once  . acetaminophen  650 mg Oral Q M,W,F-HD  . budesonide  0.5 mg Inhalation BID  . calcium acetate  1,334 mg Oral BID WC  . cetaphil  1 application Topical Daily  . chlorhexidine  15 mL Mouth/Throat BID  .  Chlorhexidine Gluconate Cloth  6 each Topical Q0600  . Chlorhexidine Gluconate  Cloth  6 each Topical Q0600  . cinacalcet  120 mg Oral Q breakfast  . divalproex  1,000 mg Oral QHS  . divalproex  250 mg Oral Daily  . docusate sodium  100 mg Oral QHS  . escitalopram  15 mg Oral Daily  . feeding supplement (ENSURE ENLIVE)  237 mL Oral TID BM  . feeding supplement (PRO-STAT SUGAR FREE 64)  30 mL Oral BID  . folic acid  2 mg Oral QHS  . insulin aspart  0-5 Units Subcutaneous QHS  . insulin aspart  0-9 Units Subcutaneous TID WC  . insulin glargine  3 Units Subcutaneous QHS  . lamoTRIgine  150 mg Oral BID  . loratadine  10 mg Oral Daily  . metoprolol tartrate  50 mg Oral BID  . multivitamin  1 tablet Oral Daily  . multivitamin with minerals  1 tablet Oral Daily  . pantoprazole  40 mg Oral Daily  . patiromer  8.4 g Oral Daily  . traZODone  150 mg Oral QHS   Continuous Infusions: . dextrose 50 mL/hr at 05/28/19 0500     LOS: 3 days     Georgette Shell, MD [facility]  If 7PM-7AM, please contact night-coverage [email or web address] Password [email or web address] 05/28/2019, 12:50 PM

## 2019-05-28 NOTE — Progress Notes (Signed)
[facility] NEPHROLOGY PROGRESS NOTE  Assessment/ Plan: Pt is a 38 y.o. yo female  history of developmental delay, schizophrenia, hypertension, diabetes, ESRD on dialysis MWF at [facility] in [place], brought in from group home for the evaluation of worsening mental status, weakness and viral-like illness.  Outpatient rx:  195 min, MWF, BFR 450, DFR 600, 2K, 2.5 ca, nipro Elisio, Left arm AVG, heparin load 2000 unit, 200 units/hr and stop before an hour.  Midodrine 10 mg before HD, sensipar 120 mg, veltassa daily, phoslo 2 tabs bid.  1. Generalized weakness and encephalopathy: Likely due to hypoglycemia and anemia.  CT head with no acute finding.  She looks more alert and awake today.  Currently on IV dextrose, recommend to minimize fluid.  2 ESRD: MWF at [facility]: Outpatient treatment reviewed.  She had dialysis yesterday with about 1.5 L UF tolerated well.  She has left upper extremity AV graft.  Next HD tomorrow.  3 Hypertension/volume: Clonidine discontinued.  She is on metoprolol.  Blood pressure on the lower side.  No lower extremity edema.  Monitor BP.  4. Anemia of ESRD: Received a unit of blood transfusion.  Iron saturation 20%.  I will order a dose of Feraheme and Aranesp to be given with dialysis tomorrow.   5. Metabolic Bone Disease: Currently on PhosLo and Sensipar.  Check lab.   Subjective: Seen and examined.  She is more alert awake and responding.  No chest pain or shortness of breath.  Still not eating therefore on dextrose IV. Objective Vital signs in last 24 hours: Vitals:   05/27/19 2339 05/28/19 0455 05/28/19 0826 05/28/19 0843  BP: 112/63 (!) 108/44 (!) 100/52   Pulse: 78 96 92   Resp: 17 17    Temp: 98.6 F (37 C) 98.9 F (37.2 C) 98.7 F (37.1 C)   TempSrc: Oral Oral Oral   SpO2: 97% 100% 100% 92%  Weight:  89.8 kg    Height:       Weight change:   Intake/Output Summary (Last 24 hours) at 05/28/2019 0845 Last data filed at  05/28/2019 0500 Gross per 24 hour  Intake 1659.99 ml  Output 1512 ml  Net 147.99 ml       Labs: Basic Metabolic Panel: Recent Labs  Lab 05/26/19 0509 05/27/19 1445 05/28/19 0424  NA 139 132*  132* 134*  K 3.8 3.6  3.6 3.5  CL 97* 91*  90* 93*  CO2 25 26  25 27   GLUCOSE 42* 101*  100* 166*  BUN 42* 54*  52* 20  CREATININE 12.09* 13.83*  13.54* 7.56*  CALCIUM 10.1 9.7  9.8  9.6 10.0  PHOS  --  4.8*  --    Liver Function Tests: Recent Labs  Lab 05/24/19 1828 05/25/19 0756 05/27/19 1445  AST 20 23  --   ALT 14 11  --   ALKPHOS 83 74  --   BILITOT 0.6 0.3  --   PROT 7.0 6.2*  --   ALBUMIN 2.7* 2.4* 2.1*   No results for input(s): LIPASE, AMYLASE in the last 168 hours. No results for input(s): AMMONIA in the last 168 hours. CBC: Recent Labs  Lab 05/24/19 1828  05/25/19 0756  05/26/19 0509 05/27/19 1445 05/28/19 0424  WBC 7.5  --  5.9  --  6.7 5.9 7.0  NEUTROABS 4.8  --   --   --  3.3 3.7 4.0  HGB 7.1*   < > 6.4*   < >  8.4* 8.4* 8.7*  HCT 23.4*   < > 20.9*   < > 27.0* 25.6* 27.1*  MCV 105.9*  --  103.5*  --  98.5 96.6 97.5  PLT 234  --  235  --  250 253 257   < > = values in this interval not displayed.   Cardiac Enzymes: No results for input(s): CKTOTAL, CKMB, CKMBINDEX, TROPONINI in the last 168 hours. CBG: Recent Labs  Lab 05/27/19 1821 05/27/19 1951 05/27/19 2337 05/28/19 0456 05/28/19 0803  GLUCAP 101* 113* 200* 140* 162*    Iron Studies:  Recent Labs    05/28/19 0424  IRON 34  TIBC 171*   Studies/Results: No results found.  Medications: Infusions: . dextrose 50 mL/hr at 05/28/19 0500    Scheduled Medications: . sodium chloride   Intravenous Once  . acetaminophen  650 mg Oral Q M,W,F-HD  . budesonide  0.5 mg Inhalation BID  . calcium acetate  1,334 mg Oral BID WC  . cetaphil  1 application Topical Daily  . chlorhexidine  15 mL Mouth/Throat BID  . Chlorhexidine Gluconate Cloth  6 each Topical Q0600  . cinacalcet  120 mg  Oral Q breakfast  . divalproex  1,000 mg Oral QHS  . divalproex  250 mg Oral Daily  . docusate sodium  100 mg Oral QHS  . escitalopram  15 mg Oral Daily  . folic acid  2 mg Oral QHS  . insulin aspart  0-5 Units Subcutaneous QHS  . insulin aspart  0-9 Units Subcutaneous TID WC  . insulin glargine  3 Units Subcutaneous QHS  . lamoTRIgine  150 mg Oral BID  . loratadine  10 mg Oral Daily  . metoprolol tartrate  50 mg Oral BID  . multivitamin  1 tablet Oral Daily  . multivitamin with minerals  1 tablet Oral Daily  . pantoprazole  40 mg Oral Daily  . patiromer  8.4 g Oral Daily  . traZODone  150 mg Oral QHS    have reviewed scheduled and prn medications.  Physical Exam: General:NAD, comfortable Heart:RRR, s1s2 nl Lungs:clear b/l, no crackle Abdomen:soft, Non-tender, non-distended Extremities:No edema Dialysis Access: Upper extremity AV graft  Dron Tanna Furry 05/28/2019,8:45 AM  LOS: 3 days  Pager: [phone]

## 2019-05-29 LAB — CBC
HCT: 25.5 % — ABNORMAL LOW (ref 36.0–46.0)
Hemoglobin: 8.2 g/dL — ABNORMAL LOW (ref 12.0–15.0)
MCH: 31.1 pg (ref 26.0–34.0)
MCHC: 32.2 g/dL (ref 30.0–36.0)
MCV: 96.6 fL (ref 80.0–100.0)
Platelets: 241 10*3/uL (ref 150–400)
RBC: 2.64 MIL/uL — ABNORMAL LOW (ref 3.87–5.11)
RDW: 14.1 % (ref 11.5–15.5)
WBC: 7.5 10*3/uL (ref 4.0–10.5)
nRBC: 0 % (ref 0.0–0.2)

## 2019-05-29 LAB — RENAL FUNCTION PANEL
Albumin: 2.3 g/dL — ABNORMAL LOW (ref 3.5–5.0)
Anion gap: 13 (ref 5–15)
BUN: 31 mg/dL — ABNORMAL HIGH (ref 6–20)
CO2: 28 mmol/L (ref 22–32)
Calcium: 9.7 mg/dL (ref 8.9–10.3)
Chloride: 86 mmol/L — ABNORMAL LOW (ref 98–111)
Creatinine, Ser: 10.05 mg/dL — ABNORMAL HIGH (ref 0.44–1.00)
GFR calc Af Amer: 5 mL/min — ABNORMAL LOW (ref >60)
GFR calc non Af Amer: 4 mL/min — ABNORMAL LOW (ref >60)
Glucose, Bld: 137 mg/dL — ABNORMAL HIGH (ref 70–99)
Phosphorus: 4.5 mg/dL (ref 2.5–4.6)
Potassium: 3.5 mmol/L (ref 3.5–5.1)
Sodium: 127 mmol/L — ABNORMAL LOW (ref 135–145)

## 2019-05-29 LAB — GLUCOSE, CAPILLARY
Glucose-Capillary: 135 mg/dL — ABNORMAL HIGH (ref 70–99)
Glucose-Capillary: 197 mg/dL — ABNORMAL HIGH (ref 70–99)
Glucose-Capillary: 212 mg/dL — ABNORMAL HIGH (ref 70–99)
Glucose-Capillary: 215 mg/dL — ABNORMAL HIGH (ref 70–99)
Glucose-Capillary: 255 mg/dL — ABNORMAL HIGH (ref 70–99)
Glucose-Capillary: 99 mg/dL (ref 70–99)

## 2019-05-29 MED ORDER — LIDOCAINE-PRILOCAINE 2.5-2.5 % EX CREA
1.0000 "application " | TOPICAL_CREAM | CUTANEOUS | Status: DC | PRN
  Filled 2019-05-29: qty 5

## 2019-05-29 MED ORDER — HEPARIN SODIUM (PORCINE) 1000 UNIT/ML IJ SOLN
INTRAMUSCULAR | Status: AC
  Filled 2019-05-29: qty 2

## 2019-05-29 MED ORDER — HEPARIN SODIUM (PORCINE) 1000 UNIT/ML DIALYSIS
2000.0000 [IU] | Freq: Once | INTRAMUSCULAR | Status: AC
  Administered 2019-05-29: 16:00:00 2000 [IU] via INTRAVENOUS_CENTRAL
  Filled 2019-05-29: qty 2

## 2019-05-29 MED ORDER — LIDOCAINE HCL (PF) 1 % IJ SOLN: 5.0000 mL | INTRAMUSCULAR | Status: DC | PRN

## 2019-05-29 MED ORDER — SODIUM CHLORIDE 0.9 % IV SOLN: 100.0000 mL | INTRAVENOUS | Status: DC | PRN

## 2019-05-29 MED ORDER — DARBEPOETIN ALFA 60 MCG/0.3ML IJ SOSY
PREFILLED_SYRINGE | INTRAMUSCULAR | Status: AC
  Administered 2019-05-29: 60 ug via INTRAVENOUS
  Filled 2019-05-29: qty 0.3

## 2019-05-29 MED ORDER — DARBEPOETIN ALFA 60 MCG/0.3ML IJ SOSY
60.0000 ug | PREFILLED_SYRINGE | INTRAMUSCULAR | Status: DC
  Administered 2019-05-29 – 2019-06-01 (×2): 60 ug via INTRAVENOUS

## 2019-05-29 MED ORDER — SODIUM CHLORIDE 0.9 % IV SOLN
250.0000 mg | INTRAVENOUS | Status: DC
  Administered 2019-05-29: 250 mg via INTRAVENOUS
  Filled 2019-05-29 (×3): qty 20

## 2019-05-29 MED ORDER — PENTAFLUOROPROP-TETRAFLUOROETH EX AERO: 1.0000 "application " | INHALATION_SPRAY | CUTANEOUS | Status: DC | PRN

## 2019-05-29 MED ORDER — HEPARIN SODIUM (PORCINE) 1000 UNIT/ML DIALYSIS
1000.0000 [IU] | INTRAMUSCULAR | Status: DC | PRN
  Filled 2019-05-29: qty 1

## 2019-05-29 NOTE — Progress Notes (Addendum)
PROGRESS NOTE    Kelly [name]  QMG:[id number] DOB: 01-03-1981 DOA: 05/24/2019 PCP: Lorelee Market, MD    Brief Narrative: 38 y.o.femalewith medical history significant ofintellectual disability, alcohol abuse, bipolar disorder, end-stage renal disease on hemodialysis Mondays Wednesdays and Fridays, diabetes, secondary hyperparathyroidism, significant depression who was brought in from a group home with weakness, confusion, and viral-like illness. Patient complained of significant fatigue, loss of taste and appetite but no fevers or chills. She has not been eating or drinking normally. Patient suspected to have acute viral illness. COVID-19 was suspected but her test result was negative, patient has had recent fracture of the right leg also given Percocets for pain and symptoms seem to have started not long after that. She denied any pain at this point. Work-up so far has been uneventful but patient is weak confused and not herself. She is being admitted for observation and possible hydration and assessment. Patient unable to answer my questions appropriately she only nods her head.We will admit her for observation and further treatment.  At presentsation the patient's temperature is 98.3, blood pressure is 130/72, pulse 90 respirate 21 oxygen sat 98% room air.Sodium 139 potassium 3.5 chloride 96 CO2 28glucose 130 BUN 31 creatinine 9.24 and calcium 10.6. Albumin is 2.7. Lactic acid 0.9. Hemoglobin 7.1 with white count 7.5 and platelets of 234. Patient has elevated hCG which is 14 with a i-STAT and 6 within lab results. Serum pregnancy test is negative. She is unable to tell me what her LMP is. Not known to have any sexual exposure and lives in the group home.Chest x-ray head CT without contrast showed no acute findings. We will admit her for observation and work-up. Her alcohol level is less than 10 and no evidence that she has any recent exposure to alcohol  The patient has  been hypoglycemicyesterdaymorning despite being given D50.The rate was eventually increased to 100 cc an hour with improvement in her glucose. The patient is refusing PO intake, and refusing her oral medications.I have discussed the patient with Dr. Cheral Marker, neurology, who feels that as the patient does respond to sternal rub that she is simply choosing not to awaken and participate in her care or conversation. He recommended calling him back for consult in a couple of days if the patient's mental status does not improve.   I have also discussed the patient with her Fowler. I learned from her that the patient was refusing to eat at the group home. She also believes that it is likely that "encephalopathy" for which she was admitted was due to hypoglycemia. I told her that the patient is continuing to refuse to eat here and that we are having to give her D10 at 100 cc/hr to maintain her glucose. I explained options for feeing tubes, and Ms Marlyne Beards that the patient would never keep either an NGT or PEG tube in. Ms Marcello Moores suggested that she be able to converse with the patient over a video connection such as Zoom. She would put the question to the patient whether she wanted to start eating, or did she want to die. If the patient stated that she wanted to die, the guardian would seek out a hospice consult. I have discussed this with the patient's nurse who said that she would pass it on to case management who would address it tomorrow.   I also told the guardian that I had decreased the patient's daytime depakote dose in order to have her be more alert.  She expressed concern that this may cause the patient to express some of her less desireable behaviors. I expressed my concern that this may be causing the patient to refuse to eat. The patient is currently refusing all oral medications anyway  05/29/2019 I picked up this patient 05/28/2019.  Patient remains on D10 drip due to  hypoglycemia and not eating anything for the last many days.  she ask for  drinks but then she does not drink.  I have left a message with her guardian to call me back.  This morning she is awake but does not follow commands or follow a conversation.  Assessment & Plan:   Principal Problem:   Weakness generalized Active Problems:   Diabetes ([place])   Essential hypertension   ESRD on dialysis ([place])   Adjustment disorder with mixed disturbance of emotions and conduct   Mild intellectual disability   Multiple fractures of both lower extremities    Metabolic encephalopathy: Possibly secondary to hypoglycemia \\lantus  has been held.  Still on D10 IV since patient refusing to eat since 05/26/2019 neurology does not feel that neurology consult is useful at this time.  They feel patient is choosing not to participate in a conversation or take her meds over 31.  Depakote dose was decreased trazodone dose was decreased. To see if this improves her level of alertness.  Legal guardian was supposed to call in 7-20 20 and talk to the patient but she has not done that.   I will consult psych to see if they have any input regarding this patient's care.  Anorexia: The patient is refusing all PO intake except for Ensure. I have explained to her that, if she does not improve her PO intake, she will need to have a feeding tube. The patient had no response to this. She will be continued on D10. Lantus will continue to be held.  Generalized weakness: DDx: Due toanorexia,anemia, ESRD, Volume depletion, hypotension.  DM II: HbA1c 6.3. Pt is on a renal diet with a hypoglycemia protocol. Blood sugars persistently low this morning. She has been started on D10 at 100cc/hr. Lantus stopped. She is still on FSBS and SSI for correction insulin.   Essential Hypertension: Hypotensive on this admission. Metoprolol and clonidine have been held due to parameters.She has responded to IV fluids bolus.  ESRD on HD:.  Dialyzes MWF  Adjustment disorder with mixed disturbance of emotions and conduct: Continue depakote, lexapro, lamictal, and trazadone. Pt lives in group home.I have reduced the daytime dose of depakote to increase that patient's level of alertness.  Multiple fractures of bilateral lower extremities.  DVT prophylaxis:SCD's Code Status:Full Code Family Communication left message with legal guardian to call me back.    Disposition Plan:Back to group home.     Nutrition Problem: Increased nutrient needs Etiology: chronic illness(ESRD on HD)     Signs/Symptoms: estimated needs    Interventions: Ensure Enlive (each supplement provides 350kcal and 20 grams of protein)  Estimated body mass index is 37.41 kg/m as calculated from the following:   Height as of this encounter: 5\' 1"  (1.549 m).   Weight as of this encounter: 89.8 kg.   Subjective:  Patient resting in bed opens her eyes when I rub her sternum.  When I asked her if she wants to live or die she just looked at me.  Did not see anything. Objective: Vitals:   05/29/19 0051 05/29/19 0403 05/29/19 0752 05/29/19 0823  BP: 105/64 121/64 127/73   Pulse: Marland Kitchen)  102 (!) 101 (!) 101 97  Resp: 17 16 18 18   Temp: 98 F (36.7 C) 98 F (36.7 C) 98.9 F (37.2 C)   TempSrc: Oral Oral Oral   SpO2: 94% 96% 99% 93%  Weight:      Height:        Intake/Output Summary (Last 24 hours) at 05/29/2019 0928 Last data filed at 05/29/2019 0700 Gross per 24 hour  Intake 1838.6 ml  Output -  Net 1838.6 ml   Filed Weights   05/27/19 1415 05/27/19 1728 05/28/19 0455  Weight: 90.3 kg 89.9 kg 89.8 kg    Examination:  General exam: Appears calm and comfortable  Respiratory system: Clear to auscultation. Respiratory effort normal. Cardiovascular system: S1 & S2 heard, RRR. No JVD, murmurs, rubs, gallops or clicks. No pedal edema. Gastrointestinal system: Abdomen is nondistended, soft and nontender. No organomegaly or masses felt.  Normal bowel sounds heard. Central nervous system: Alert and oriented. No focal neurological deficits. Extremities: Trace edema bilaterally Skin: No rashes, lesions or ulcers     Data Reviewed: I have personally reviewed following labs and imaging studies  CBC: Recent Labs  Lab 05/24/19 1828  05/25/19 0756 05/25/19 1515 05/26/19 0509 05/27/19 1445 05/28/19 0424  WBC 7.5  --  5.9  --  6.7 5.9 7.0  NEUTROABS 4.8  --   --   --  3.3 3.7 4.0  HGB 7.1*   < > 6.4* 8.3* 8.4* 8.4* 8.7*  HCT 23.4*   < > 20.9* 24.6* 27.0* 25.6* 27.1*  MCV 105.9*  --  103.5*  --  98.5 96.6 97.5  PLT 234  --  235  --  250 253 257   < > = values in this interval not displayed.   Basic Metabolic Panel: Recent Labs  Lab 05/24/19 1828 05/24/19 1842 05/25/19 0756 05/26/19 0509 05/27/19 1445 05/28/19 0424  NA 139 136 139 139 132*  132* 134*  K 3.5 3.5 3.4* 3.8 3.6  3.6 3.5  CL 96* 98 96* 97* 91*  90* 93*  CO2 28  --  28 25 26  25 27   GLUCOSE 130* 125* 109* 42* 101*  100* 166*  BUN 31* 29* 36* 42* 54*  52* 20  CREATININE 9.24* 9.20* 10.49* 12.09* 13.83*  13.54* 7.56*  CALCIUM 10.6*  --  10.3 10.1 9.7  9.8  9.6 10.0  PHOS  --   --   --   --  4.8*  --    GFR: Estimated Creatinine Clearance: 10.4 mL/min (A) (by C-G formula based on SCr of 7.56 mg/dL (H)). Liver Function Tests: Recent Labs  Lab 05/24/19 1828 05/25/19 0756 05/27/19 1445  AST 20 23  --   ALT 14 11  --   ALKPHOS 83 74  --   BILITOT 0.6 0.3  --   PROT 7.0 6.2*  --   ALBUMIN 2.7* 2.4* 2.1*   No results for input(s): LIPASE, AMYLASE in the last 168 hours. No results for input(s): AMMONIA in the last 168 hours. Coagulation Profile: No results for input(s): INR, PROTIME in the last 168 hours. Cardiac Enzymes: No results for input(s): CKTOTAL, CKMB, CKMBINDEX, TROPONINI in the last 168 hours. BNP (last 3 results) No results for input(s): PROBNP in the last 8760 hours. HbA1C: No results for input(s): HGBA1C in the last 72  hours. CBG: Recent Labs  Lab 05/28/19 1152 05/28/19 1643 05/28/19 2029 05/29/19 0046 05/29/19 0437  GLUCAP 155* 126* 176* 215* 197*   Lipid  Profile: No results for input(s): CHOL, HDL, LDLCALC, TRIG, CHOLHDL, LDLDIRECT in the last 72 hours. Thyroid Function Tests: No results for input(s): TSH, T4TOTAL, FREET4, T3FREE, THYROIDAB in the last 72 hours. Anemia Panel: Recent Labs    05/28/19 0424  TIBC 171*  IRON 34   Sepsis Labs: Recent Labs  Lab 05/24/19 1826 05/24/19 1932  LATICACIDVEN 0.9 0.9    Recent Results (from the past 240 hour(s))  SARS Coronavirus 2 (CEPHEID- Performed in [facility] hospital lab), Hosp Order     Status: None   Collection Time: 05/24/19  6:15 PM   Specimen: Nasopharyngeal Swab  Result Value Ref Range Status   SARS Coronavirus 2 NEGATIVE NEGATIVE Final    Comment: (NOTE) If result is NEGATIVE SARS-CoV-2 target nucleic acids are NOT DETECTED. The SARS-CoV-2 RNA is generally detectable in upper and lower  respiratory specimens during the acute phase of infection. The lowest  concentration of SARS-CoV-2 viral copies this assay can detect is 250  copies / mL. A negative result does not preclude SARS-CoV-2 infection  and should not be used as the sole basis for treatment or other  patient management decisions.  A negative result may occur with  improper specimen collection / handling, submission of specimen other  than nasopharyngeal swab, presence of viral mutation(s) within the  areas targeted by this assay, and inadequate number of viral copies  (<250 copies / mL). A negative result must be combined with clinical  observations, patient history, and epidemiological information. If result is POSITIVE SARS-CoV-2 target nucleic acids are DETECTED. The SARS-CoV-2 RNA is generally detectable in upper and lower  respiratory specimens dur ing the acute phase of infection.  Positive  results are indicative of active infection with SARS-CoV-2.   Clinical  correlation with patient history and other diagnostic information is  necessary to determine patient infection status.  Positive results do  not rule out bacterial infection or co-infection with other viruses. If result is PRESUMPTIVE POSTIVE SARS-CoV-2 nucleic acids MAY BE PRESENT.   A presumptive positive result was obtained on the submitted specimen  and confirmed on repeat testing.  While 2019 novel coronavirus  (SARS-CoV-2) nucleic acids may be present in the submitted sample  additional confirmatory testing may be necessary for epidemiological  and / or clinical management purposes  to differentiate between  SARS-CoV-2 and other Sarbecovirus currently known to infect humans.  If clinically indicated additional testing with an alternate test  methodology [phone]) is advised. The SARS-CoV-2 RNA is generally  detectable in upper and lower respiratory sp ecimens during the acute  phase of infection. The expected result is Negative. Fact Sheet for Patients:  StrictlyIdeas.no Fact Sheet for Healthcare Providers: BankingDealers.co.za This test is not yet approved or cleared by the [place] FDA and has been authorized for detection and/or diagnosis of SARS-CoV-2 by FDA under an Emergency Use Authorization (EUA).  This EUA will remain in effect (meaning this test can be used) for the duration of the COVID-19 declaration under Section 564(b)(1) of the Act, 21 U.S.C. section 360bbb-3(b)(1), unless the authorization is terminated or revoked sooner. Performed at [facility], [place] [address] [place], [place] [address]          Radiology Studies: No results found.      Scheduled Meds: . sodium chloride   Intravenous Once  . acetaminophen  650 mg Oral Q M,W,F-HD  . budesonide  0.5 mg Inhalation BID  . calcium acetate  1,334 mg Oral BID WC  .  cetaphil  1 application Topical Daily  . chlorhexidine  15 mL  Mouth/Throat BID  . Chlorhexidine Gluconate Cloth  6 each Topical Q0600  . Chlorhexidine Gluconate Cloth  6 each Topical Q0600  . cinacalcet  120 mg Oral Q breakfast  . divalproex  1,000 mg Oral QHS  . divalproex  250 mg Oral Daily  . docusate sodium  100 mg Oral QHS  . escitalopram  15 mg Oral Daily  . feeding supplement (ENSURE ENLIVE)  237 mL Oral TID BM  . feeding supplement (PRO-STAT SUGAR FREE 64)  30 mL Oral BID  . folic acid  2 mg Oral QHS  . insulin aspart  0-5 Units Subcutaneous QHS  . insulin aspart  0-9 Units Subcutaneous TID WC  . lamoTRIgine  150 mg Oral BID  . loratadine  10 mg Oral Daily  . metoprolol tartrate  50 mg Oral BID  . multivitamin  1 tablet Oral Daily  . multivitamin with minerals  1 tablet Oral Daily  . pantoprazole  40 mg Oral Daily  . patiromer  8.4 g Oral Daily  . traZODone  50 mg Oral QHS   Continuous Infusions: . dextrose 50 mL/hr at 05/28/19 0500     LOS: 4 days     Georgette Shell, MD [facility]  If 7PM-7AM, please contact night-coverage [email or web address] Password [email or web address] 05/29/2019, 9:28 AM

## 2019-05-29 NOTE — Plan of Care (Signed)
Progressing towards goals

## 2019-05-29 NOTE — Progress Notes (Signed)
Kelly [name]  Assessment/ Plan: Pt is a 38 y.o. yo female  history of developmental delay, schizophrenia, hypertension, diabetes, ESRD on dialysis MWF at [facility] in [place], brought in from group home for the evaluation of worsening mental status, weakness and viral-like illness.  Outpatient rx:  195 min, MWF, BFR 450, DFR 600, 2K, 2.5 ca, nipro Elisio, Left arm AVG, heparin load 2000 unit, 200 units/hr and stop before an hour.  Midodrine 10 mg before HD, sensipar 120 mg, veltassa daily, phoslo 2 tabs bid.  1. Generalized weakness and encephalopathy: Likely due to hypoglycemia and anemia.  CT head with no acute finding.  She has poor oral intake therefore on dextrose IV.  Psychiatry consult was requested.  2 ESRD: MWF at [facility]: Outpatient treatment reviewed.  Plan for HD today.  She has left upper extremity AV graft.    3 Hypertension/volume: Clonidine discontinued.  She is on metoprolol, may need to change to IV if she is not taking oral medication.  No lower extremity edema.  Monitor BP.  4. Anemia of ESRD: Received a unit of blood transfusion.  Iron saturation 20%.  IV iron x 2 dose and start Aranesp.   5. Metabolic Bone Disease: Currently on PhosLo and Sensipar.     Subjective: Seen and examined.  She is alert awake.  No change in mental status.  Decreased oral intake. Objective Vital signs in last 24 hours: Vitals:   05/29/19 0403 05/29/19 0752 05/29/19 0823 05/29/19 1158  BP: 121/64 127/73  137/68  Pulse: (!) 101 (!) 101 97 100  Resp: 16 18 18 18   Temp: 98 F (36.7 C) 98.9 F (37.2 C)  98.3 F (36.8 C)  TempSrc: Oral Oral  Axillary  SpO2: 96% 99% 93% 99%  Weight:      Height:       Weight change:   Intake/Output Summary (Last 24 hours) at 05/29/2019 1511 Last data filed at 05/29/2019 0700 Gross per 24 hour  Intake 1601.6 ml  Output -  Net 1601.6 ml       Labs: Basic Metabolic Panel: Recent Labs  Lab  05/26/19 0509 05/27/19 1445 05/28/19 0424  NA 139 132*  132* 134*  K 3.8 3.6  3.6 3.5  CL 97* 91*  90* 93*  CO2 25 26  25 27   GLUCOSE 42* 101*  100* 166*  BUN 42* 54*  52* 20  CREATININE 12.09* 13.83*  13.54* 7.56*  CALCIUM 10.1 9.7  9.8  9.6 10.0  PHOS  --  4.8*  --    Liver Function Tests: Recent Labs  Lab 05/24/19 1828 05/25/19 0756 05/27/19 1445  AST 20 23  --   ALT 14 11  --   ALKPHOS 83 74  --   BILITOT 0.6 0.3  --   PROT 7.0 6.2*  --   ALBUMIN 2.7* 2.4* 2.1*   No results for input(s): LIPASE, AMYLASE in the last 168 hours. No results for input(s): AMMONIA in the last 168 hours. CBC: Recent Labs  Lab 05/24/19 1828  05/25/19 0756  05/26/19 0509 05/27/19 1445 05/28/19 0424  WBC 7.5  --  5.9  --  6.7 5.9 7.0  NEUTROABS 4.8  --   --   --  3.3 3.7 4.0  HGB 7.1*   < > 6.4*   < > 8.4* 8.4* 8.7*  HCT 23.4*   < > 20.9*   < > 27.0* 25.6* 27.1*  MCV 105.9*  --  103.5*  --  98.5 96.6 97.5  PLT 234  --  235  --  250 253 257   < > = values in this interval not displayed.   Cardiac Enzymes: No results for input(s): CKTOTAL, CKMB, CKMBINDEX, TROPONINI in the last 168 hours. CBG: Recent Labs  Lab 05/28/19 1643 05/28/19 2029 05/29/19 0046 05/29/19 0437 05/29/19 1204  GLUCAP 126* 176* 215* 197* 212*    Iron Studies:  Recent Labs    05/28/19 0424  IRON 34  TIBC 171*   Studies/Results: No results found.  Medications: Infusions: . dextrose 50 mL/hr at 05/28/19 0500    Scheduled Medications: . sodium chloride   Intravenous Once  . acetaminophen  650 mg Oral Q M,W,F-HD  . budesonide  0.5 mg Inhalation BID  . calcium acetate  1,334 mg Oral BID WC  . cetaphil  1 application Topical Daily  . chlorhexidine  15 mL Mouth/Throat BID  . Chlorhexidine Gluconate Cloth  6 each Topical Q0600  . Chlorhexidine Gluconate Cloth  6 each Topical Q0600  . cinacalcet  120 mg Oral Q breakfast  . divalproex  1,000 mg Oral QHS  . divalproex  250 mg Oral Daily  .  docusate sodium  100 mg Oral QHS  . escitalopram  15 mg Oral Daily  . feeding supplement (ENSURE ENLIVE)  237 mL Oral TID BM  . feeding supplement (PRO-STAT SUGAR FREE 64)  30 mL Oral BID  . folic acid  2 mg Oral QHS  . insulin aspart  0-5 Units Subcutaneous QHS  . insulin aspart  0-9 Units Subcutaneous TID WC  . lamoTRIgine  150 mg Oral BID  . loratadine  10 mg Oral Daily  . metoprolol tartrate  50 mg Oral BID  . multivitamin  1 tablet Oral Daily  . multivitamin with minerals  1 tablet Oral Daily  . pantoprazole  40 mg Oral Daily  . patiromer  8.4 g Oral Daily  . traZODone  50 mg Oral QHS    have reviewed scheduled and prn medications.  Physical Exam: General: Not in distress, comfortable Heart:RRR, s1s2 nl, no rubs Lungs: Clear b/l, no crackle Abdomen:soft, Non-tender, non-distended Extremities:No edema Dialysis Access: Upper extremity AV graft  Rawn Quiroa Tanna Furry 05/29/2019,3:11 PM   LOS: 4 days  Pager: [phone]

## 2019-05-30 LAB — RENAL FUNCTION PANEL
Albumin: 2.3 g/dL — ABNORMAL LOW (ref 3.5–5.0)
Anion gap: 15 (ref 5–15)
BUN: 25 mg/dL — ABNORMAL HIGH (ref 6–20)
CO2: 21 mmol/L — ABNORMAL LOW (ref 22–32)
Calcium: 10.1 mg/dL (ref 8.9–10.3)
Chloride: 90 mmol/L — ABNORMAL LOW (ref 98–111)
Creatinine, Ser: 7.16 mg/dL — ABNORMAL HIGH (ref 0.44–1.00)
GFR calc Af Amer: 8 mL/min — ABNORMAL LOW (ref >60)
GFR calc non Af Amer: 7 mL/min — ABNORMAL LOW (ref >60)
Glucose, Bld: 164 mg/dL — ABNORMAL HIGH (ref 70–99)
Phosphorus: 3.7 mg/dL (ref 2.5–4.6)
Potassium: 4.8 mmol/L (ref 3.5–5.1)
Sodium: 126 mmol/L — ABNORMAL LOW (ref 135–145)

## 2019-05-30 LAB — GLUCOSE, CAPILLARY
Glucose-Capillary: 255 mg/dL — ABNORMAL HIGH (ref 70–99)
Glucose-Capillary: 240 mg/dL — ABNORMAL HIGH (ref 70–99)
Glucose-Capillary: 164 mg/dL — ABNORMAL HIGH (ref 70–99)
Glucose-Capillary: 132 mg/dL — ABNORMAL HIGH (ref 70–99)
Glucose-Capillary: 158 mg/dL — ABNORMAL HIGH (ref 70–99)

## 2019-05-30 NOTE — Progress Notes (Signed)
PROGRESS NOTE    Kelly [name]  QDI:[id number] DOB: May 07, 1981 DOA: 05/24/2019 PCP: Lorelee Market, MD    Brief Narrative 38 y.o.femalewith medical history significant ofintellectual disability, alcohol abuse, bipolar disorder, end-stage renal disease on hemodialysis Mondays Wednesdays and Fridays, diabetes, secondary hyperparathyroidism, significant depression who was brought in from a group home with weakness, confusion, and viral-like illness. Patient complained of significant fatigue, loss of taste and appetite but no fevers or chills. She has not been eating or drinking normally. Patient suspected to have acute viral illness. COVID-19 was suspected but her test result was negative, patient has had recent fracture of the right leg also given Percocets for pain and symptoms seem to have started not long after that. She denied any pain at this point. Work-up so far has been uneventful but patient is weak confused and not herself. She is being admitted for observation and possible hydration and assessment. Patient unable to answer my questions appropriately she only nods her head.We will admit her for observation and further treatment.  At presentsation the patient's temperature is 98.3, blood pressure is 130/72, pulse 90 respirate 21 oxygen sat 98% room air.Sodium 139 potassium 3.5 chloride 96 CO2 28glucose 130 BUN 31 creatinine 9.24 and calcium 10.6. Albumin is 2.7. Lactic acid 0.9. Hemoglobin 7.1 with white count 7.5 and platelets of 234. Patient has elevated hCG which is 14 with a i-STAT and 6 within lab results. Serum pregnancy test is negative. She is unable to tell me what her LMP is. Not known to have any sexual exposure and lives in the group home.Chest x-ray head CT without contrast showed no acute findings. We will admit her for observation and work-up. Her alcohol level is less than 10 and no evidence that she has any recent exposure to alcohol  The patient has  been hypoglycemicyesterdaymorning despite being given D50.The rate was eventually increased to 100 cc an hour with improvement in her glucose. The patient is refusing PO intake, and refusing her oral medications.I have discussed the patient with Dr. Cheral Marker, neurology, who feels that as the patient does respond to sternal rub that she is simply choosing not to awaken and participate in her care or conversation. He recommended calling him back for consult in a couple of days if the patient's mental status does not improve.   05/29/2019 I picked up this patient 05/28/2019.  Patient remains on D10 drip due to hypoglycemia and not eating anything for the last many days.  she ask for  drinks but then she does not drink.  I have left a message with her guardian to call me back.  This morning she is awake but does not follow commands or follow a conversation.  05/30/2019 patient resting in bed much more awake and alert she is agreeable that she is going to eat today so she can go back home.  She remains on D10.  Assessment & Plan:   Principal Problem:   Weakness generalized Active Problems:   Diabetes ([place])   Essential hypertension   ESRD on dialysis ([place])   Adjustment disorder with mixed disturbance of emotions and conduct   Mild intellectual disability   Multiple fractures of both lower extremities   Metabolic encephalopathy:Possibly secondary to hypoglycemia lantus has been held.Still on D10 IV since patient refusing to eat since 05/26/2019 neurology does not feel that neurology consult is useful at this time.  They feel patient is choosing not to participate in a conversation or take her meds. Depakote dose was  decreased trazodone dose was decreased.To see if this improves her level of alertness.  Discussed with legal guardian today.  She is supposed to speak with her today over the phone.  Psych was consulted yesterday but they were unable to see the patient as she was in dialysis.  They went over  the patient's chart and did not have any new advice or new suggestions.  Type 2 diabetes with anorexia: Hemoglobin A1c 6.3.  The patient is refusing all PO intake except for Ensure. Generalized weakness: DDx: Due toanorexia,anemia, ESRD, Volume depletion, hypotension.  Patient is on D10 100 cc an hour this was decreased to 50 cc an hour today.  Sugars are starting difficult.  However she is still not eating properly.  Continue SSI.  Essential Hypertension: Renew metoprolol clonidine has been stopped due to soft blood pressure.   ESRD on HD:. Dialyzes MWF  Adjustment disorder with mixed disturbance of emotions and conduct: Continue depakote, lexapro, lamictal, and trazadone. Pt lives in group home.I have reduced the daytime dose of depakote to increase that patient's level of alertness.  Multiple fractures of bilateral lower extremities.  DVT prophylaxis:SCD's Code Status:Full Code Family Communication  discussed with legal guardian the only reason that is keeping her in the hospital is her lack of food intake.  Legal guardian advised me that she will speak to the patient so she would eat and so she can go back to the group home.  Disposition Plan:Back to group home.      Nutrition Problem: Increased nutrient needs Etiology: chronic illness(ESRD on HD)     Signs/Symptoms: estimated needs    Interventions: Ensure Enlive (each supplement provides 350kcal and 20 grams of protein)  Estimated body mass index is 36.07 kg/m as calculated from the following:   Height as of this encounter: 5\' 1"  (1.549 m).   Weight as of this encounter: 86.6 kg.   Subjective:  Awake in no distress reports that she will start eating today so she can go back home. Objective: Vitals:   05/29/19 2128 05/29/19 2352 05/30/19 0428 05/30/19 0822  BP:  (!) 106/56 (!) 112/42 135/73  Pulse: 89 84 91 (!) 101  Resp: 16 17 18 13   Temp:  98.1 F (36.7 C) 98.3 F (36.8 C) 98.8 F (37.1  C)  TempSrc:  Oral Oral Oral  SpO2: 97% 94% 95% 98%  Weight:      Height:        Intake/Output Summary (Last 24 hours) at 05/30/2019 1036 Last data filed at 05/30/2019 0800 Gross per 24 hour  Intake 1814.56 ml  Output 2000 ml  Net -185.44 ml   Filed Weights   05/28/19 0455 05/29/19 1535 05/29/19 1844  Weight: 89.8 kg 89.7 kg 86.6 kg    Examination:  General exam: Appears calm and comfortable  Respiratory system: Clear to auscultation. Respiratory effort normal. Cardiovascular system: S1 & S2 heard, RRR. No JVD, murmurs, rubs, gallops or clicks. No pedal edema. Gastrointestinal system: Abdomen is nondistended, soft and nontender. No organomegaly or masses felt. Normal bowel sounds heard. Central nervous system: Alert and oriented. No focal neurological deficits. Extremities: Symmetric 5 x 5 power. Skin: No rashes, lesions or ulcers    Data Reviewed: I have personally reviewed following labs and imaging studies  CBC: Recent Labs  Lab 05/24/19 1828  05/25/19 0756 05/25/19 1515 05/26/19 0509 05/27/19 1445 05/28/19 0424 05/29/19 1545  WBC 7.5  --  5.9  --  6.7 5.9 7.0 7.5  NEUTROABS 4.8  --   --   --  3.3 3.7 4.0  --   HGB 7.1*   < > 6.4* 8.3* 8.4* 8.4* 8.7* 8.2*  HCT 23.4*   < > 20.9* 24.6* 27.0* 25.6* 27.1* 25.5*  MCV 105.9*  --  103.5*  --  98.5 96.6 97.5 96.6  PLT 234  --  235  --  250 253 257 241   < > = values in this interval not displayed.   Basic Metabolic Panel: Recent Labs  Lab 05/25/19 0756 05/26/19 0509 05/27/19 1445 05/28/19 0424 05/29/19 1545  NA 139 139 132*   132* 134* 127*  K 3.4* 3.8 3.6   3.6 3.5 3.5  CL 96* 97* 91*   90* 93* 86*  CO2 28 25 26   25 27 28   GLUCOSE 109* 42* 101*   100* 166* 137*  BUN 36* 42* 54*   52* 20 31*  CREATININE 10.49* 12.09* 13.83*   13.54* 7.56* 10.05*  CALCIUM 10.3 10.1 9.7   9.8   9.6 10.0 9.7  PHOS  --   --  4.8*  --  4.5   GFR: Estimated Creatinine Clearance: 7.7 mL/min (A) (by C-G formula based on SCr of  10.05 mg/dL (H)). Liver Function Tests: Recent Labs  Lab 05/24/19 1828 05/25/19 0756 05/27/19 1445 05/29/19 1545  AST 20 23  --   --   ALT 14 11  --   --   ALKPHOS 83 74  --   --   BILITOT 0.6 0.3  --   --   PROT 7.0 6.2*  --   --   ALBUMIN 2.7* 2.4* 2.1* 2.3*   No results for input(s): LIPASE, AMYLASE in the last 168 hours. No results for input(s): AMMONIA in the last 168 hours. Coagulation Profile: No results for input(s): INR, PROTIME in the last 168 hours. Cardiac Enzymes: No results for input(s): CKTOTAL, CKMB, CKMBINDEX, TROPONINI in the last 168 hours. BNP (last 3 results) No results for input(s): PROBNP in the last 8760 hours. HbA1C: No results for input(s): HGBA1C in the last 72 hours. CBG: Recent Labs  Lab 05/29/19 1913 05/29/19 2030 05/29/19 2350 05/30/19 0430 05/30/19 0814  GLUCAP 99 135* 255* 255* 240*   Lipid Profile: No results for input(s): CHOL, HDL, LDLCALC, TRIG, CHOLHDL, LDLDIRECT in the last 72 hours. Thyroid Function Tests: No results for input(s): TSH, T4TOTAL, FREET4, T3FREE, THYROIDAB in the last 72 hours. Anemia Panel: Recent Labs    05/28/19 0424  TIBC 171*  IRON 34   Sepsis Labs: Recent Labs  Lab 05/24/19 1826 05/24/19 1932  LATICACIDVEN 0.9 0.9    Recent Results (from the past 240 hour(s))  SARS Coronavirus 2 (CEPHEID- Performed in [facility] hospital lab), Hosp Order     Status: None   Collection Time: 05/24/19  6:15 PM   Specimen: Nasopharyngeal Swab  Result Value Ref Range Status   SARS Coronavirus 2 NEGATIVE NEGATIVE Final    Comment: (NOTE) If result is NEGATIVE SARS-CoV-2 target nucleic acids are NOT DETECTED. The SARS-CoV-2 RNA is generally detectable in upper and lower  respiratory specimens during the acute phase of infection. The lowest  concentration of SARS-CoV-2 viral copies this assay can detect is 250  copies / mL. A negative result does not preclude SARS-CoV-2 infection  and should not be used as the sole  basis for treatment or other  patient management decisions.  A negative result may occur with  improper specimen collection / handling, submission of specimen other  than nasopharyngeal swab, presence  of viral mutation(s) within the  areas targeted by this assay, and inadequate number of viral copies  (<250 copies / mL). A negative result must be combined with clinical  observations, patient history, and epidemiological information. If result is POSITIVE SARS-CoV-2 target nucleic acids are DETECTED. The SARS-CoV-2 RNA is generally detectable in upper and lower  respiratory specimens dur ing the acute phase of infection.  Positive  results are indicative of active infection with SARS-CoV-2.  Clinical  correlation with patient history and other diagnostic information is  necessary to determine patient infection status.  Positive results do  not rule out bacterial infection or co-infection with other viruses. If result is PRESUMPTIVE POSTIVE SARS-CoV-2 nucleic acids MAY BE PRESENT.   A presumptive positive result was obtained on the submitted specimen  and confirmed on repeat testing.  While 2019 novel coronavirus  (SARS-CoV-2) nucleic acids may be present in the submitted sample  additional confirmatory testing may be necessary for epidemiological  and / or clinical management purposes  to differentiate between  SARS-CoV-2 and other Sarbecovirus currently known to infect humans.  If clinically indicated additional testing with an alternate test  methodology [phone]) is advised. The SARS-CoV-2 RNA is generally  detectable in upper and lower respiratory sp ecimens during the acute  phase of infection. The expected result is Negative. Fact Sheet for Patients:  StrictlyIdeas.no Fact Sheet for Healthcare Providers: BankingDealers.co.za This test is not yet approved or cleared by the [place] FDA and has been authorized for detection  and/or diagnosis of SARS-CoV-2 by FDA under an Emergency Use Authorization (EUA).  This EUA will remain in effect (meaning this test can be used) for the duration of the COVID-19 declaration under Section 564(b)(1) of the Act, 21 U.S.C. section 360bbb-3(b)(1), unless the authorization is terminated or revoked sooner. Performed at [facility], [place] [address] [place], [place] [address]          Radiology Studies: No results found.      Scheduled Meds:  sodium chloride   Intravenous Once   acetaminophen  650 mg Oral Q M,W,F-HD   budesonide  0.5 mg Inhalation BID   calcium acetate  1,334 mg Oral BID WC   cetaphil  1 application Topical Daily   chlorhexidine  15 mL Mouth/Throat BID   Chlorhexidine Gluconate Cloth  6 each Topical Q0600   Chlorhexidine Gluconate Cloth  6 each Topical Q0600   cinacalcet  120 mg Oral Q breakfast   darbepoetin (ARANESP) injection - DIALYSIS  60 mcg Intravenous Q Fri-HD   divalproex  1,000 mg Oral QHS   divalproex  250 mg Oral Daily   docusate sodium  100 mg Oral QHS   escitalopram  15 mg Oral Daily   feeding supplement (ENSURE ENLIVE)  237 mL Oral TID BM   feeding supplement (PRO-STAT SUGAR FREE 64)  30 mL Oral BID   folic acid  2 mg Oral QHS   insulin aspart  0-5 Units Subcutaneous QHS   insulin aspart  0-9 Units Subcutaneous TID WC   lamoTRIgine  150 mg Oral BID   loratadine  10 mg Oral Daily   metoprolol tartrate  50 mg Oral BID   multivitamin  1 tablet Oral Daily   multivitamin with minerals  1 tablet Oral Daily   pantoprazole  40 mg Oral Daily   patiromer  8.4 g Oral Daily   traZODone  50 mg Oral QHS   Continuous Infusions:  dextrose 50 mL/hr at 05/30/19 [phone]  ferric gluconate (FERRLECIT/NULECIT) IV Stopped (05/29/19 1955)     LOS: 5 days       Georgette Shell, MD [facility]  If 7PM-7AM, please contact night-coverage [email or web address] Password [email or web address] 05/30/2019, 10:36 AM

## 2019-05-30 NOTE — Progress Notes (Signed)
[facility] NEPHROLOGY PROGRESS NOTE  Assessment/ Plan: Pt is a 38 y.o. yo female  history of developmental delay, schizophrenia, hypertension, diabetes, ESRD on dialysis MWF at [facility] in [place], brought in from group home for the evaluation of worsening mental status, weakness and viral-like illness.  Outpatient rx:  195 min, MWF, BFR 450, DFR 600, 2K, 2.5 ca, nipro Elisio, Left arm AVG, heparin load 2000 unit, 200 units/hr and stop before an hour.  Midodrine 10 mg before HD, sensipar 120 mg, veltassa daily, phoslo 2 tabs bid.  1. Generalized weakness and encephalopathy: Likely due to hypoglycemia and anemia.  CT head with no acute finding.  She has poor oral intake therefore on dextrose IV.  Psychiatry consult was requested.  Per primary team.  2 ESRD: MWF at [facility]: Outpatient treatment reviewed.  Status post HD yesterday with 2 L UF, tolerated well.  Hyponatremia prior to HD due to hypervolemia.  Repeat renal panel.  She has left upper extremity AV graft.  Next HD on Monday.  3 Hypertension/volume: Clonidine discontinued.  She is on metoprolol, may need to change to IV if she is not taking oral medication.  No lower extremity edema.  Monitor BP.  4. Anemia of ESRD: Received a unit of blood transfusion.  Iron saturation 20%.  IV iron x 2 dose and start Aranesp.   5. Metabolic Bone Disease: Currently on PhosLo and Sensipar.    Calcium phosphorus acceptable.   Subjective: Seen and examined.  She is alert awake and more responsive today.  Still poor oral intake.  Had dialysis yesterday.  Denies any complaints today.  Objective Vital signs in last 24 hours: Vitals:   05/29/19 2128 05/29/19 2352 05/30/19 0428 05/30/19 0822  BP:  (!) 106/56 (!) 112/42 135/73  Pulse: 89 84 91 (!) 101  Resp: 16 17 18 13   Temp:  98.1 F (36.7 C) 98.3 F (36.8 C) 98.8 F (37.1 C)  TempSrc:  Oral Oral Oral  SpO2: 97% 94% 95% 98%  Weight:      Height:       Weight  change:   Intake/Output Summary (Last 24 hours) at 05/30/2019 1038 Last data filed at 05/30/2019 0800 Gross per 24 hour  Intake 1814.56 ml  Output 2000 ml  Net -185.44 ml       Labs: Basic Metabolic Panel: Recent Labs  Lab 05/27/19 1445 05/28/19 0424 05/29/19 1545  NA 132*  132* 134* 127*  K 3.6  3.6 3.5 3.5  CL 91*  90* 93* 86*  CO2 26  25 27 28   GLUCOSE 101*  100* 166* 137*  BUN 54*  52* 20 31*  CREATININE 13.83*  13.54* 7.56* 10.05*  CALCIUM 9.7  9.8  9.6 10.0 9.7  PHOS 4.8*  --  4.5   Liver Function Tests: Recent Labs  Lab 05/24/19 1828 05/25/19 0756 05/27/19 1445 05/29/19 1545  AST 20 23  --   --   ALT 14 11  --   --   ALKPHOS 83 74  --   --   BILITOT 0.6 0.3  --   --   PROT 7.0 6.2*  --   --   ALBUMIN 2.7* 2.4* 2.1* 2.3*   No results for input(s): LIPASE, AMYLASE in the last 168 hours. No results for input(s): AMMONIA in the last 168 hours. CBC: Recent Labs  Lab 05/25/19 0756  05/26/19 0509 05/27/19 1445 05/28/19 0424 05/29/19 1545  WBC 5.9  --  6.7 5.9 7.0  7.5  NEUTROABS  --   --  3.3 3.7 4.0  --   HGB 6.4*   < > 8.4* 8.4* 8.7* 8.2*  HCT 20.9*   < > 27.0* 25.6* 27.1* 25.5*  MCV 103.5*  --  98.5 96.6 97.5 96.6  PLT 235  --  250 253 257 241   < > = values in this interval not displayed.   Cardiac Enzymes: No results for input(s): CKTOTAL, CKMB, CKMBINDEX, TROPONINI in the last 168 hours. CBG: Recent Labs  Lab 05/29/19 1913 05/29/19 2030 05/29/19 2350 05/30/19 0430 05/30/19 0814  GLUCAP 99 135* 255* 255* 240*    Iron Studies:  Recent Labs    05/28/19 0424  IRON 34  TIBC 171*   Studies/Results: No results found.  Medications: Infusions: . dextrose 50 mL/hr at 05/30/19 0653  . ferric gluconate (FERRLECIT/NULECIT) IV Stopped (05/29/19 1955)    Scheduled Medications: . sodium chloride   Intravenous Once  . acetaminophen  650 mg Oral Q M,W,F-HD  . budesonide  0.5 mg Inhalation BID  . calcium acetate  1,334 mg Oral BID  WC  . cetaphil  1 application Topical Daily  . chlorhexidine  15 mL Mouth/Throat BID  . Chlorhexidine Gluconate Cloth  6 each Topical Q0600  . Chlorhexidine Gluconate Cloth  6 each Topical Q0600  . cinacalcet  120 mg Oral Q breakfast  . darbepoetin (ARANESP) injection - DIALYSIS  60 mcg Intravenous Q Fri-HD  . divalproex  1,000 mg Oral QHS  . divalproex  250 mg Oral Daily  . docusate sodium  100 mg Oral QHS  . escitalopram  15 mg Oral Daily  . feeding supplement (ENSURE ENLIVE)  237 mL Oral TID BM  . feeding supplement (PRO-STAT SUGAR FREE 64)  30 mL Oral BID  . folic acid  2 mg Oral QHS  . insulin aspart  0-5 Units Subcutaneous QHS  . insulin aspart  0-9 Units Subcutaneous TID WC  . lamoTRIgine  150 mg Oral BID  . loratadine  10 mg Oral Daily  . metoprolol tartrate  50 mg Oral BID  . multivitamin  1 tablet Oral Daily  . multivitamin with minerals  1 tablet Oral Daily  . pantoprazole  40 mg Oral Daily  . patiromer  8.4 g Oral Daily  . traZODone  50 mg Oral QHS    have reviewed scheduled and prn medications.  Physical Exam: General: Not in distress, comfortable Heart:RRR, s1s2 nl, no rubs Lungs: Clear b/l, no crackle Abdomen:soft, Non-tender, non-distended Extremities:No edema Dialysis Access: Upper extremity AV graft, thrill and bruit  Samar Dass Prasad Ebon Ketchum 05/30/2019,10:38 AM   LOS: 5 days  Pager: [phone]

## 2019-05-31 LAB — GLUCOSE, CAPILLARY
Glucose-Capillary: 209 mg/dL — ABNORMAL HIGH (ref 70–99)
Glucose-Capillary: 200 mg/dL — ABNORMAL HIGH (ref 70–99)
Glucose-Capillary: 211 mg/dL — ABNORMAL HIGH (ref 70–99)
Glucose-Capillary: 183 mg/dL — ABNORMAL HIGH (ref 70–99)
Glucose-Capillary: 106 mg/dL — ABNORMAL HIGH (ref 70–99)
Glucose-Capillary: 126 mg/dL — ABNORMAL HIGH (ref 70–99)
Glucose-Capillary: 140 mg/dL — ABNORMAL HIGH (ref 70–99)

## 2019-05-31 MED ORDER — INSULIN ASPART 100 UNIT/ML ~~LOC~~ SOLN
3.0000 [IU] | Freq: Once | SUBCUTANEOUS | Status: AC
  Administered 2019-05-31: 05:00:00 3 [IU] via SUBCUTANEOUS

## 2019-05-31 MED ORDER — TRAZODONE HCL 100 MG PO TABS
100.0000 mg | ORAL_TABLET | Freq: Every day | ORAL | Status: DC
  Administered 2019-05-31: 100 mg via ORAL
  Filled 2019-05-31: qty 1

## 2019-05-31 MED ORDER — CHLORHEXIDINE GLUCONATE CLOTH 2 % EX PADS
6.0000 | MEDICATED_PAD | Freq: Every day | CUTANEOUS | Status: DC
  Administered 2019-06-01: 06:00:00 6 via TOPICAL

## 2019-05-31 MED ORDER — DIVALPROEX SODIUM 250 MG PO DR TAB
500.0000 mg | DELAYED_RELEASE_TABLET | Freq: Every day | ORAL | Status: DC
  Administered 2019-06-01: 500 mg via ORAL
  Filled 2019-05-31: qty 2

## 2019-05-31 NOTE — Progress Notes (Signed)
PROGRESS NOTE    Kelly [name]  YIR:[id number] DOB: [birth year] DOA: 05/24/2019 PCP: Lorelee Market, MD  Brief Narrative:38 y.o.femalewith medical history significant ofintellectual disability, alcohol abuse, bipolar disorder, end-stage renal disease on hemodialysis Mondays Wednesdays and Fridays, diabetes, secondary hyperparathyroidism, significant depression who was brought in from a group home with weakness, confusion, and viral-like illness. Patient complained of significant fatigue, loss of taste and appetite but no fevers or chills. She has not been eating or drinking normally. Patient suspected to have acute viral illness. COVID-19 was suspected but her test result was negative, patient has had recent fracture of the right leg also given Percocets for pain and symptoms seem to have started not long after that. She denied any pain at this point. Work-up so far has been uneventful but patient is weak confused and not herself. She is being admitted for observation and possible hydration and assessment. Patient unable to answer my questions appropriately she only nods her head.We will admit her for observation and further treatment.  At presentsation the patient's temperature is 98.3, blood pressure is 130/72, pulse 90 respirate 21 oxygen sat 98% room air.Sodium 139 potassium 3.5 chloride 96 CO2 28glucose 130 BUN 31 creatinine 9.24 and calcium 10.6. Albumin is 2.7. Lactic acid 0.9. Hemoglobin 7.1 with white count 7.5 and platelets of 234. Patient has elevated hCG which is 14 with a i-STAT and 6 within lab results. Serum pregnancy test is negative. She is unable to tell me what her LMP is. Not known to have any sexual exposure and lives in the group home.Chest x-ray head CT without contrast showed no acute findings. We will admit her for observation and work-up. Her alcohol level is less than 10 and no evidence that she has any recent exposure to alcohol  The patient has been  hypoglycemicyesterdaymorning despite being given D50.The rate was eventually increased to 100 cc an hour with improvement in her glucose. The patient is refusing PO intake, and refusing her oral medications.I have discussed the patient with Dr. Cheral Marker, neurology, who feels that as the patient does respond to sternal rub that she is simply choosing not to awaken and participate in her care or conversation. He recommended calling him back for consult in a couple of days if the patient's mental status does not improve.  05/29/2019 I picked up this patient 05/28/2019. Patient remains on D10 drip due to hypoglycemia and not eating anything for the last many days. she ask for drinksbut then she does not drink. I have left a message with her guardian to call me back. This morning she is awake but does not follow commands or follow a conversation.  05/30/2019 patient resting in bed much more awake and alert she is agreeable that she is going to eat today so she can go back home.  She remains on D10.  7/5 patient awake ate some dinner not much .The patient is asked to make an attempt to improve diet and exercise patterns to aid in medical management of this problem. Changed diet per guardian request to regular so that she will eat something..she is willing to eat today if she gets boiled eggs hash brown and sausage  Assessment & Plan:   Principal Problem:   Weakness generalized Active Problems:   Diabetes ([place])   Essential hypertension   ESRD on dialysis ([place])   Adjustment disorder with mixed disturbance of emotions and conduct   Mild intellectual disability   Multiple fractures of both lower extremities   Metabolic encephalopathy:Patient  admitted with change in mental status and confusion which was thought to be secondary to hypoglycemia from not eating.  She takes Lantus which has been on hold.  She has been on D10 which is stopped today.  Blood sugars are stable so far.  She is only on sliding  scale insulin.  Her diet has been changed from renal diet to regular diet so she would eat something that she likes per request of her guardian.   Type 2 diabetes with anorexia: Hemoglobin A1c 6.3.   Continue regular diet and sliding scale insulin hold restarting Lantus.  Essential Hypertension:  metoprolol clonidine has been stopped due to soft blood pressure.  Blood pressure 142/70 today will follow.  ESRD on HD:. Dialyzes MWF  Adjustment disorder with mixed disturbance of emotions and conduct: Continue depakote, lexapro, lamictal, and trazadone. Pt lives in group home.I have reduced the daytime dose of depakote to increase that patient's level of alertness.  Multiple fractures of bilateral lower extremities  Hyponatremia-worsening?  Question secondary to D10 will follow in a.m.  D10 has been stopped today.  DVT prophylaxis:SCD's Code Status:Full Code Family Communication discussed with legal guardian  Disposition Plan: D10 has been stopped today encourage patient to eat if blood sugar remains stable overnight we will plan discharge tomorrow after dialysis.  Nutrition Problem: Increased nutrient needs Etiology: chronic illness(ESRD on HD)     Signs/Symptoms: estimated needs    Interventions: Ensure Enlive (each supplement provides 350kcal and 20 grams of protein)  Estimated body mass index is 36.07 kg/m as calculated from the following:   Height as of this encounter: 5\' 1"  (1.549 m).   Weight as of this encounter: 86.6 kg.    Subjective: Awake when I asked her what she want to eat she said boiled egg sausage links and hashbrowns no other complaints  Objective: Vitals:   05/31/19 0334 05/31/19 0359 05/31/19 0737 05/31/19 0754  BP: (!) 91/41 (!) 104/53 (!) 142/70   Pulse: 88  90   Resp: 20 18 16    Temp: 98.1 F (36.7 C) 98.2 F (36.8 C) 98.5 F (36.9 C)   TempSrc: Oral Oral Oral   SpO2: 100% 100% 94% 92%  Weight:      Height:        Intake/Output  Summary (Last 24 hours) at 05/31/2019 1011 Last data filed at 05/31/2019 0914 Gross per 24 hour  Intake 942.93 ml  Output -  Net 942.93 ml   Filed Weights   05/28/19 0455 05/29/19 1535 05/29/19 1844  Weight: 89.8 kg 89.7 kg 86.6 kg    Examination:  General exam: Appears calm and comfortable  Respiratory system: Clear to auscultation. Respiratory effort normal. Cardiovascular system: S1 & S2 heard, RRR. No JVD, murmurs, rubs, gallops or clicks. No pedal edema. Gastrointestinal system: Abdomen is nondistended, soft and nontender. No organomegaly or masses felt. Normal bowel sounds heard. Central nervous system: Alert and oriented. No focal neurological deficits. Extremities: Symmetric 5 x 5 power. Skin: No rashes, lesions or ulcers Psychiatry: Judgement and insight appear normal. Mood & affect appropriate.     Data Reviewed: I have personally reviewed following labs and imaging studies  CBC: Recent Labs  Lab 05/24/19 1828  05/25/19 0756 05/25/19 1515 05/26/19 0509 05/27/19 1445 05/28/19 0424 05/29/19 1545  WBC 7.5  --  5.9  --  6.7 5.9 7.0 7.5  NEUTROABS 4.8  --   --   --  3.3 3.7 4.0  --   HGB 7.1*   < >  6.4* 8.3* 8.4* 8.4* 8.7* 8.2*  HCT 23.4*   < > 20.9* 24.6* 27.0* 25.6* 27.1* 25.5*  MCV 105.9*  --  103.5*  --  98.5 96.6 97.5 96.6  PLT 234  --  235  --  250 253 257 241   < > = values in this interval not displayed.   Basic Metabolic Panel: Recent Labs  Lab 05/26/19 0509 05/27/19 1445 05/28/19 0424 05/29/19 1545 05/30/19 1807  NA 139 132*  132* 134* 127* 126*  K 3.8 3.6  3.6 3.5 3.5 4.8  CL 97* 91*  90* 93* 86* 90*  CO2 25 26  25 27 28  21*  GLUCOSE 42* 101*  100* 166* 137* 164*  BUN 42* 54*  52* 20 31* 25*  CREATININE 12.09* 13.83*  13.54* 7.56* 10.05* 7.16*  CALCIUM 10.1 9.7  9.8  9.6 10.0 9.7 10.1  PHOS  --  4.8*  --  4.5 3.7   GFR: Estimated Creatinine Clearance: 10.8 mL/min (A) (by C-G formula based on SCr of 7.16 mg/dL (H)). Liver Function  Tests: Recent Labs  Lab 05/24/19 1828 05/25/19 0756 05/27/19 1445 05/29/19 1545 05/30/19 1807  AST 20 23  --   --   --   ALT 14 11  --   --   --   ALKPHOS 83 74  --   --   --   BILITOT 0.6 0.3  --   --   --   PROT 7.0 6.2*  --   --   --   ALBUMIN 2.7* 2.4* 2.1* 2.3* 2.3*   No results for input(s): LIPASE, AMYLASE in the last 168 hours. No results for input(s): AMMONIA in the last 168 hours. Coagulation Profile: No results for input(s): INR, PROTIME in the last 168 hours. Cardiac Enzymes: No results for input(s): CKTOTAL, CKMB, CKMBINDEX, TROPONINI in the last 168 hours. BNP (last 3 results) No results for input(s): PROBNP in the last 8760 hours. HbA1C: No results for input(s): HGBA1C in the last 72 hours. CBG: Recent Labs  Lab 05/30/19 2105 05/31/19 0113 05/31/19 0403 05/31/19 0522 05/31/19 0814  GLUCAP 158* 209* 200* 211* 183*   Lipid Profile: No results for input(s): CHOL, HDL, LDLCALC, TRIG, CHOLHDL, LDLDIRECT in the last 72 hours. Thyroid Function Tests: No results for input(s): TSH, T4TOTAL, FREET4, T3FREE, THYROIDAB in the last 72 hours. Anemia Panel: No results for input(s): VITAMINB12, FOLATE, FERRITIN, TIBC, IRON, RETICCTPCT in the last 72 hours. Sepsis Labs: Recent Labs  Lab 05/24/19 1826 05/24/19 1932  LATICACIDVEN 0.9 0.9    Recent Results (from the past 240 hour(s))  SARS Coronavirus 2 (CEPHEID- Performed in [facility] hospital lab), Hosp Order     Status: None   Collection Time: 05/24/19  6:15 PM   Specimen: Nasopharyngeal Swab  Result Value Ref Range Status   SARS Coronavirus 2 NEGATIVE NEGATIVE Final    Comment: (NOTE) If result is NEGATIVE SARS-CoV-2 target nucleic acids are NOT DETECTED. The SARS-CoV-2 RNA is generally detectable in upper and lower  respiratory specimens during the acute phase of infection. The lowest  concentration of SARS-CoV-2 viral copies this assay can detect is 250  copies / mL. A negative result does not preclude  SARS-CoV-2 infection  and should not be used as the sole basis for treatment or other  patient management decisions.  A negative result may occur with  improper specimen collection / handling, submission of specimen other  than nasopharyngeal swab, presence of viral mutation(s) within the  areas targeted by this assay, and inadequate number of viral copies  (<250 copies / mL). A negative result must be combined with clinical  observations, patient history, and epidemiological information. If result is POSITIVE SARS-CoV-2 target nucleic acids are DETECTED. The SARS-CoV-2 RNA is generally detectable in upper and lower  respiratory specimens dur ing the acute phase of infection.  Positive  results are indicative of active infection with SARS-CoV-2.  Clinical  correlation with patient history and other diagnostic information is  necessary to determine patient infection status.  Positive results do  not rule out bacterial infection or co-infection with other viruses. If result is PRESUMPTIVE POSTIVE SARS-CoV-2 nucleic acids MAY BE PRESENT.   A presumptive positive result was obtained on the submitted specimen  and confirmed on repeat testing.  While 2019 novel coronavirus  (SARS-CoV-2) nucleic acids may be present in the submitted sample  additional confirmatory testing may be necessary for epidemiological  and / or clinical management purposes  to differentiate between  SARS-CoV-2 and other Sarbecovirus currently known to infect humans.  If clinically indicated additional testing with an alternate test  methodology [phone]) is advised. The SARS-CoV-2 RNA is generally  detectable in upper and lower respiratory sp ecimens during the acute  phase of infection. The expected result is Negative. Fact Sheet for Patients:  StrictlyIdeas.no Fact Sheet for Healthcare Providers: BankingDealers.co.za This test is not yet approved or cleared by the  [place] FDA and has been authorized for detection and/or diagnosis of SARS-CoV-2 by FDA under an Emergency Use Authorization (EUA).  This EUA will remain in effect (meaning this test can be used) for the duration of the COVID-19 declaration under Section 564(b)(1) of the Act, 21 U.S.C. section 360bbb-3(b)(1), unless the authorization is terminated or revoked sooner. Performed at [facility], [place] [address] [place], [place] [address]          Radiology Studies: No results found.      Scheduled Meds: . sodium chloride   Intravenous Once  . acetaminophen  650 mg Oral Q M,W,F-HD  . budesonide  0.5 mg Inhalation BID  . calcium acetate  1,334 mg Oral BID WC  . cetaphil  1 application Topical Daily  . chlorhexidine  15 mL Mouth/Throat BID  . Chlorhexidine Gluconate Cloth  6 each Topical Q0600  . Chlorhexidine Gluconate Cloth  6 each Topical Q0600  . cinacalcet  120 mg Oral Q breakfast  . darbepoetin (ARANESP) injection - DIALYSIS  60 mcg Intravenous Q Fri-HD  . divalproex  1,000 mg Oral QHS  . divalproex  250 mg Oral Daily  . docusate sodium  100 mg Oral QHS  . escitalopram  15 mg Oral Daily  . feeding supplement (ENSURE ENLIVE)  237 mL Oral TID BM  . feeding supplement (PRO-STAT SUGAR FREE 64)  30 mL Oral BID  . folic acid  2 mg Oral QHS  . insulin aspart  0-5 Units Subcutaneous QHS  . insulin aspart  0-9 Units Subcutaneous TID WC  . lamoTRIgine  150 mg Oral BID  . loratadine  10 mg Oral Daily  . metoprolol tartrate  50 mg Oral BID  . multivitamin  1 tablet Oral Daily  . multivitamin with minerals  1 tablet Oral Daily  . pantoprazole  40 mg Oral Daily  . patiromer  8.4 g Oral Daily  . traZODone  50 mg Oral QHS   Continuous Infusions: . ferric gluconate (FERRLECIT/NULECIT) IV Stopped (05/29/19 1955)     LOS:  6 days     Georgette Shell, MD [facility]  If 7PM-7AM, please contact night-coverage [email or web address] Password [email or web address] 05/31/2019,  10:11 AM

## 2019-05-31 NOTE — Progress Notes (Signed)
Kelly [name] PROGRESS NOTE  Assessment/ Plan: Pt is a 38 y.o. yo female  history of developmental delay, schizophrenia, hypertension, diabetes, ESRD on dialysis MWF at [facility] in [place], brought in from group home for the evaluation of worsening mental status, weakness and viral-like illness.  Outpatient rx:  195 min, MWF, BFR 450, DFR 600, 2K, 2.5 ca, nipro Elisio, Left arm AVG, heparin load 2000 unit, 200 units/hr and stop before an hour.  Midodrine 10 mg before HD, sensipar 120 mg, veltassa daily, phoslo 2 tabs bid.  1. Generalized weakness and encephalopathy: Likely due to hypoglycemia and anemia.  CT head with no acute finding.  Still having poor oral intake, she is trying.  Off dextrose IV today.  Primary team is following.  2 ESRD: MWF at [facility]: Outpatient treatment reviewed.  Plan for hemodialysis tomorrow.  She has left upper extremity AV graft.  Hyponatremia likely due to dextrose IV/hypervolemic.  We will try ultrafiltration tomorrow.  3 Hypertension/volume: Clonidine discontinued.  She is on metoprolol, may need to change to IV if she is not taking oral medication. Monitor BP.  4. Anemia of ESRD: Received a unit of blood transfusion.  Iron saturation 20%.  IV iron x 2 dose and start Aranesp.   5. Metabolic Bone Disease: Currently on PhosLo and Sensipar.    Calcium phosphorus acceptable.  Subjective: Seen and examined.  Still having poor oral intake.  She is alert awake and responsive but review of system is limited. Objective Vital signs in last 24 hours: Vitals:   05/31/19 0359 05/31/19 0737 05/31/19 0754 05/31/19 1146  BP: (!) 104/53 (!) 142/70  (!) 131/56  Pulse:  90  92  Resp: 18 16  16   Temp: 98.2 F (36.8 C) 98.5 F (36.9 C)  98.5 F (36.9 C)  TempSrc: Oral Oral  Oral  SpO2: 100% 94% 92% 94%  Weight:      Height:       Weight change:   Intake/Output Summary (Last 24 hours) at 05/31/2019 1404 Last data filed at 05/31/2019  0914 Gross per 24 hour  Intake 942.93 ml  Output -  Net 942.93 ml       Labs: Basic Metabolic Panel: Recent Labs  Lab 05/27/19 1445 05/28/19 0424 05/29/19 1545 05/30/19 1807  NA 132*  132* 134* 127* 126*  K 3.6  3.6 3.5 3.5 4.8  CL 91*  90* 93* 86* 90*  CO2 26  25 27 28  21*  GLUCOSE 101*  100* 166* 137* 164*  BUN 54*  52* 20 31* 25*  CREATININE 13.83*  13.54* 7.56* 10.05* 7.16*  CALCIUM 9.7  9.8  9.6 10.0 9.7 10.1  PHOS 4.8*  --  4.5 3.7   Liver Function Tests: Recent Labs  Lab 05/24/19 1828 05/25/19 0756 05/27/19 1445 05/29/19 1545 05/30/19 1807  AST 20 23  --   --   --   ALT 14 11  --   --   --   ALKPHOS 83 74  --   --   --   BILITOT 0.6 0.3  --   --   --   PROT 7.0 6.2*  --   --   --   ALBUMIN 2.7* 2.4* 2.1* 2.3* 2.3*   No results for input(s): LIPASE, AMYLASE in the last 168 hours. No results for input(s): AMMONIA in the last 168 hours. CBC: Recent Labs  Lab 05/25/19 0756  05/26/19 0509 05/27/19 1445 05/28/19 0424 05/29/19 1545  WBC 5.9  --  6.7 5.9 7.0 7.5  NEUTROABS  --   --  3.3 3.7 4.0  --   HGB 6.4*   < > 8.4* 8.4* 8.7* 8.2*  HCT 20.9*   < > 27.0* 25.6* 27.1* 25.5*  MCV 103.5*  --  98.5 96.6 97.5 96.6  PLT 235  --  250 253 257 241   < > = values in this interval not displayed.   Cardiac Enzymes: No results for input(s): CKTOTAL, CKMB, CKMBINDEX, TROPONINI in the last 168 hours. CBG: Recent Labs  Lab 05/31/19 0113 05/31/19 0403 05/31/19 0522 05/31/19 0814 05/31/19 1244  GLUCAP 209* 200* 211* 183* 106*    Iron Studies:  No results for input(s): IRON, TIBC, TRANSFERRIN, FERRITIN in the last 72 hours. Studies/Results: No results found.  Medications: Infusions: . ferric gluconate (FERRLECIT/NULECIT) IV Stopped (05/29/19 1955)    Scheduled Medications: . sodium chloride   Intravenous Once  . acetaminophen  650 mg Oral Q M,W,F-HD  . budesonide  0.5 mg Inhalation BID  . calcium acetate  1,334 mg Oral BID WC  . cetaphil   1 application Topical Daily  . chlorhexidine  15 mL Mouth/Throat BID  . Chlorhexidine Gluconate Cloth  6 each Topical Q0600  . Chlorhexidine Gluconate Cloth  6 each Topical Q0600  . cinacalcet  120 mg Oral Q breakfast  . darbepoetin (ARANESP) injection - DIALYSIS  60 mcg Intravenous Q Fri-HD  . divalproex  1,000 mg Oral QHS  . [START ON 06/01/2019] divalproex  500 mg Oral Daily  . docusate sodium  100 mg Oral QHS  . escitalopram  15 mg Oral Daily  . feeding supplement (ENSURE ENLIVE)  237 mL Oral TID BM  . feeding supplement (PRO-STAT SUGAR FREE 64)  30 mL Oral BID  . folic acid  2 mg Oral QHS  . insulin aspart  0-5 Units Subcutaneous QHS  . insulin aspart  0-9 Units Subcutaneous TID WC  . lamoTRIgine  150 mg Oral BID  . loratadine  10 mg Oral Daily  . metoprolol tartrate  50 mg Oral BID  . multivitamin  1 tablet Oral Daily  . multivitamin with minerals  1 tablet Oral Daily  . pantoprazole  40 mg Oral Daily  . patiromer  8.4 g Oral Daily  . traZODone  100 mg Oral QHS    have reviewed scheduled and prn medications.  Physical Exam: General: NAD, comfortable Heart:RRR, s1s2 nl, no rubs Lungs: Clear b/l, no crackle Abdomen:soft, Non-tender, non-distended Extremities: Trace LE  edema Dialysis Access: Upper extremity AV graft, thrill and bruit  Elleana Stillson Tanna Furry 05/31/2019,2:04 PM   LOS: 6 days  Pager: [phone]

## 2019-06-01 LAB — BASIC METABOLIC PANEL
Anion gap: 12 (ref 5–15)
BUN: 39 mg/dL — ABNORMAL HIGH (ref 6–20)
CO2: 27 mmol/L (ref 22–32)
Calcium: 11.1 mg/dL — ABNORMAL HIGH (ref 8.9–10.3)
Chloride: 88 mmol/L — ABNORMAL LOW (ref 98–111)
Creatinine, Ser: 9.26 mg/dL — ABNORMAL HIGH (ref 0.44–1.00)
GFR calc Af Amer: 6 mL/min — ABNORMAL LOW (ref >60)
GFR calc non Af Amer: 5 mL/min — ABNORMAL LOW (ref >60)
Glucose, Bld: 169 mg/dL — ABNORMAL HIGH (ref 70–99)
Potassium: 4.2 mmol/L (ref 3.5–5.1)
Sodium: 127 mmol/L — ABNORMAL LOW (ref 135–145)

## 2019-06-01 LAB — GLUCOSE, CAPILLARY
Glucose-Capillary: 125 mg/dL — ABNORMAL HIGH (ref 70–99)
Glucose-Capillary: 120 mg/dL — ABNORMAL HIGH (ref 70–99)

## 2019-06-01 MED ORDER — DARBEPOETIN ALFA 60 MCG/0.3ML IJ SOSY
PREFILLED_SYRINGE | INTRAMUSCULAR | Status: AC
  Filled 2019-06-01: qty 0.3

## 2019-06-01 MED ORDER — INSULIN GLARGINE 100 UNIT/ML ~~LOC~~ SOLN: 10.0000 [IU] | Freq: Every day | SUBCUTANEOUS | 11 refills | Status: AC

## 2019-06-01 NOTE — TOC Transition Note (Signed)
Transition of Care [facility]) - CM/SW Discharge Note   Patient Details  Name: Kelly [name] MRN: [id number] Date of Birth: [birth year]  [facility] of Care [facility]) CM/SW Contact:  Pollie Friar, RN Phone Number: 06/01/2019, 2:36 PM   Clinical Narrative:    Pt discharging back to [facility] Group home. Group home and patients legal guardian aware of d/c.  TOC went over the level of assistance the patient is requiring and the group home feels they can meet her needs. They did want a hoyer lift and this has been ordered and will be delivered to the home. They also requested some home therapies. TOC notified [place] who they have used in the past.  Group home to provide transportation home. Bedside RN to call after she is finished with HD for them to pick her up.   Final next level of care: Group Home(with home health services) Barriers to Discharge: No Barriers Identified   Patient Goals and CMS Choice   CMS Medicare.gov Compare Post Acute Care list provided to:: Patient Represenative (must comment) Choice offered to / list presented to : (group home)  Discharge Placement                       Discharge Plan and Services   Discharge Planning Services: CM Consult            DME Arranged: Other see comment(hoyer lift) DME Agency: [employer] Date DME Agency Contacted: 06/01/19   Representative spoke with at DME Agency: [place]: PT, OT [place] Agency: [place] ([place]) Date [place]: 06/01/19   Representative spoke with at [place]: [place] ([place]) Interventions     Readmission Risk Interventions No flowsheet data found.

## 2019-06-01 NOTE — TOC Initial Note (Signed)
Transition of Care [facility]) - Initial/Assessment Note    Patient Details  Name: Kelly [name] MRN: [id number] Date of Birth: 1981/11/03  Transition of Care [facility]) CM/SW Contact:    Pollie Friar, RN Phone Number: 06/01/2019, 2:36 PM  Clinical Narrative:                   Expected Discharge Plan: Group Home Barriers to Discharge: No Barriers Identified   Patient Goals and CMS Choice   CMS Medicare.gov Compare Post Acute Care list provided to:: Patient Represenative (must comment) Choice offered to / list presented to : (group home)  Expected Discharge Plan and Services Expected Discharge Plan: Group Home   Discharge Planning Services: CM Consult   Living arrangements for the past 2 months: Group Home Expected Discharge Date: 06/01/19               DME Arranged: Other see comment(hoyer lift) DME Agency: [employer] Date DME Agency Contacted: 06/01/19   Representative spoke with at DME Agency: [place]: PT, OT [place] Agency: [place] ([place]) Date [place]: 06/01/19   Representative spoke with at [place]: Butch Penny  Prior Living Arrangements/Services Living arrangements for the past 2 months: [place] with:: Facility Resident   Do you feel safe going back to the place where you live?: Yes      Need for Family Participation in Patient Care: Yes (Comment) Care giver support system in place?: Yes (comment)(group home) Current home services: DME(wheelchair) Criminal Activity/Legal Involvement Pertinent to Current Situation/Hospitalization: No - Comment as needed  Activities of Daily Living      Permission Sought/Granted                  Emotional Assessment Appearance:: Appears younger than stated age     Orientation: : Oriented to Self   Psych Involvement: No (comment)  Admission diagnosis:  weakness, dialysis pt Patient Active Problem List   Diagnosis Date Noted  . Weakness generalized 05/24/2019  . Influenza A  12/22/2018  . Hypotension 12/22/2018  . Complication from renal dialysis device 11/06/2018  . Multiple fractures of both lower extremities 06/18/2017  . 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 Hyperkalemia 06/25/2016  . Adjustment disorder with mixed disturbance of emotions and conduct   . Diabetes ([place]) 04/23/2015  . Essential hypertension 04/23/2015  . ESRD on dialysis ([place]) 04/23/2015  . Pain of left arm 04/22/2015   PCP:  Lorelee Market, MD Pharmacy:  No Pharmacies Listed    Social Determinants of Health (SDOH) Interventions    Readmission Risk Interventions No flowsheet data found.

## 2019-06-01 NOTE — Progress Notes (Signed)
[facility] Progress Note   Assessment/ Plan:   Outpatient rx:  195 min, MWF, BFR 450, DFR 600, 2K, 2.5 ca, nipro Elisio, Left arm AVG, heparin load 2000 unit, 200 units/hr and stop before an hour.  Midodrine 10 mg before HD, sensipar 120 mg, veltassa daily, phoslo 2 tabs bid.  1. Generalized weakness and encephalopathy: Likely due to hypoglycemia and anemia. CT head with no acute finding.  Still having poor oral intake, she is trying.  Off dextrose IV. Primary team is following.  2 ESRD:MWF at [facility]: Outpatient treatment reviewed.  Plan for hemodialysis today 7/6  She has left upper extremity AV graft.  Hyponatremia likely due to dextrose IV/hypervolemic.  We will try healthy UF goal.  3 Hypertension/volume: Clonidine discontinued.  She is on metoprolol. Monitor BP.  4. Anemia of ESRD:Received a unit of blood transfusion.  Iron saturation 20%.  IV iron x 2 dose and start Aranesp.  5. Metabolic Bone Disease:Currently on PhosLo and Sensipar.   Calcium phosphorus acceptable.  Noted mild hypercalcemia this AM.  Will insure 2Ca bath and may need a different phos binder that is non calcium based as OP.    Subjective:    Seen in room.  For dialysis and then discharge today.     Objective:   BP (!) 93/54 (BP Location: Left Leg)   Pulse 91   Temp 98.4 F (36.9 C) (Oral)   Resp 20   Ht 5\' 1"  (1.549 m)   Wt 86.6 kg   SpO2 99%   BMI 36.07 kg/m   Physical Exam: Gen: sleeping, opens eyes to my voice and then goes back to sleep CVS: RRR no m/r/g Resp: clear anteriorly Abd: obese, NABS Ext: 1+ LE edema ACCESS: LUE AVG + T/B  Labs: BMET Recent Labs  Lab 05/26/19 0509 05/27/19 1445 05/28/19 0424 05/29/19 1545 05/30/19 1807 06/01/19 0651  NA 139 132*  132* 134* 127* 126* 127*  K 3.8 3.6  3.6 3.5 3.5 4.8 4.2  CL 97* 91*  90* 93* 86* 90* 88*  CO2 25 26  25 27 28  21* 27  GLUCOSE 42* 101*  100* 166* 137* 164* 169*  BUN 42* 54*  52* 20  31* 25* 39*  CREATININE 12.09* 13.83*  13.54* 7.56* 10.05* 7.16* 9.26*  CALCIUM 10.1 9.7  9.8  9.6 10.0 9.7 10.1 11.1*  PHOS  --  4.8*  --  4.5 3.7  --    CBC Recent Labs  Lab 05/26/19 0509 05/27/19 1445 05/28/19 0424 05/29/19 1545  WBC 6.7 5.9 7.0 7.5  NEUTROABS 3.3 3.7 4.0  --   HGB 8.4* 8.4* 8.7* 8.2*  HCT 27.0* 25.6* 27.1* 25.5*  MCV 98.5 96.6 97.5 96.6  PLT 250 253 257 241    @IMGRELPRIORS @ Medications:    . sodium chloride   Intravenous Once  . acetaminophen  650 mg Oral Q M,W,F-HD  . budesonide  0.5 mg Inhalation BID  . calcium acetate  1,334 mg Oral BID WC  . cetaphil  1 application Topical Daily  . chlorhexidine  15 mL Mouth/Throat BID  . Chlorhexidine Gluconate Cloth  6 each Topical Q0600  . Chlorhexidine Gluconate Cloth  6 each Topical Q0600  . Chlorhexidine Gluconate Cloth  6 each Topical Q0600  . cinacalcet  120 mg Oral Q breakfast  . darbepoetin (ARANESP) injection - DIALYSIS  60 mcg Intravenous Q Fri-HD  . divalproex  1,000 mg Oral QHS  . divalproex  500 mg Oral Daily  .  docusate sodium  100 mg Oral QHS  . escitalopram  15 mg Oral Daily  . feeding supplement (ENSURE ENLIVE)  237 mL Oral TID BM  . feeding supplement (PRO-STAT SUGAR FREE 64)  30 mL Oral BID  . folic acid  2 mg Oral QHS  . insulin aspart  0-5 Units Subcutaneous QHS  . insulin aspart  0-9 Units Subcutaneous TID WC  . lamoTRIgine  150 mg Oral BID  . loratadine  10 mg Oral Daily  . metoprolol tartrate  50 mg Oral BID  . multivitamin  1 tablet Oral Daily  . multivitamin with minerals  1 tablet Oral Daily  . pantoprazole  40 mg Oral Daily  . patiromer  8.4 g Oral Daily  . traZODone  100 mg Oral QHS     Madelon Lips, MD [place] pgr [phone] 06/01/2019, 12:26 PM

## 2019-06-01 NOTE — Discharge Summary (Signed)
Physician Discharge Summary  Leylany Nored EXN:[id number] DOB: [birth year] DOA: 05/24/2019  PCP: Lorelee Market, MD  Admit date: 05/24/2019 Discharge date: 06/01/2019  Admitted From: Group home Disposition: Group home  Recommendations for Outpatient Follow-up:  1. Follow up with PCP in 1-2 weeks 2. Please obtain BMP/CBC in one week 3. Please follow up for dialysis Monday Wednesday and Friday  Home Health none Equipment/Devices: None  Discharge Condition: Stable and improved CODE STATUS: Full code  diet recommendation regular Brief/Interim Summary:38 y.o.femalewith medical history significant ofintellectual disability, alcohol abuse, bipolar disorder, end-stage renal disease on hemodialysis Mondays Wednesdays and Fridays, diabetes, secondary hyperparathyroidism, significant depression who was brought in from a group home with weakness, confusion, and viral-like illness. Patient complained of significant fatigue, loss of taste and appetite but no fevers or chills. She has not been eating or drinking normally. Patient suspected to have acute viral illness. COVID-19 was suspected but her test result was negative, patient has had recent fracture of the right leg also given Percocets for pain and symptoms seem to have started not long after that. She denied any pain at this point. Work-up so far has been uneventful but patient is weak confused and not herself. She is being admitted for observation and possible hydration and assessment. Patient unable to answer my questions appropriately she only nods her head.We will admit her for observation and further treatment.  At presentsation the patient's temperature is 98.3, blood pressure is 130/72, pulse 90 respirate 21 oxygen sat 98% room air.Sodium 139 potassium 3.5 chloride 96 CO2 28glucose 130 BUN 31 creatinine 9.24 and calcium 10.6. Albumin is 2.7. Lactic acid 0.9. Hemoglobin 7.1 with white count 7.5 and platelets of 234. Patient  has elevated hCG which is 14 with a i-STAT and 6 within lab results. Serum pregnancy test is negative. She is unable to tell me what her LMP is. Not known to have any sexual exposure and lives in the group home.Chest x-ray head CT without contrast showed no acute findings. We will admit her for observation and work-up. Her alcohol level is less than 10 and no evidence that she has any recent exposure to alcohol  The patient has been hypoglycemicyesterdaymorning despite being given D50.The rate was eventually increased to 100 cc an hour with improvement in her glucose. The patient is refusing PO intake, and refusing her oral medications.I have discussed the patient with Dr. Cheral Marker, neurology, who feels that as the patient does respond to sternal rub that she is simply choosing not to awaken and participate in her care or conversation. He recommended calling him back for consult in a couple of days if the patient's mental status does not improve.   Discharge Diagnoses:  Principal Problem:   Weakness generalized Active Problems:   Diabetes ([place])   Essential hypertension   ESRD on dialysis ([place])   Adjustment disorder with mixed disturbance of emotions and conduct   Mild intellectual disability   Multiple fractures of both lower extremities  Metabolic encephalopathy:Patient admitted with change in mental status and confusion which was thought to be secondary to hypoglycemia from not eating.  She takes Lantus which has been on hold.  She has been on D10 which is stopped yesterday.  Blood sugars are stable so far.  She is only on sliding scale insulin.  Her diet has been changed from renal diet to regular diet so she would eat something that she likes per request of her guardian.  Her blood sugars have been stable off of any  IV drips.  I have discussed with the group home director and her guardian and will plan for discharge back to the group home today after dialysis.  Type 2 diabetes with  anorexia:Hemoglobin A1c 6.3.  Continue regular diet and sliding scale insulin.  Prior to admission she was on Lantus 20 units if her blood sugar is above 150.  I have decreased that to 10 units at the group home.  I have also told him if they are in doubt or if they if the sugar is low to hold the Lantus and call her doctor.    Essential Hypertension: metoprolol clonidine has been stopped due to soft blood pressure.  Her blood pressure has been mostly soft to low side so metoprolol and clonidine has been stopped please monitor closely as an outpatient and restart them if needed.   ESRD on HD:. Dialyzes MWF  Adjustment disorder with mixed disturbance of emotions and conduct: Continue depakote, lexapro, lamictal, and trazadone. Pt lives in group home.I have reduced the daytime dose of depakote to increase that patient's level of alertness.  Multiple fractures of bilateral lower extremities  Hyponatremia-improving.  Nutrition Problem: Increased nutrient needs Etiology: chronic illness(ESRD on HD)    Signs/Symptoms: estimated needs     Interventions: Ensure Enlive (each supplement provides 350kcal and 20 grams of protein)  Estimated body mass index is 36.07 kg/m as calculated from the following:   Height as of this encounter: 5\' 1"  (1.549 m).   Weight as of this encounter: 86.6 kg.  Discharge Instructions  Discharge Instructions    Call MD for:  difficulty breathing, headache or visual disturbances   Complete by: As directed    Call MD for:  persistant dizziness or light-headedness   Complete by: As directed    Call MD for:  persistant nausea and vomiting   Complete by: As directed    Diet - low sodium heart healthy   Complete by: As directed    Increase activity slowly   Complete by: As directed      Allergies as of 06/01/2019   No Known Allergies     Medication List    STOP taking these medications   risperiDONE 0.5 MG tablet Commonly known as: RISPERDAL    vitamin B-12 1000 MCG tablet Commonly known as: CYANOCOBALAMIN     TAKE these medications   acetaminophen 325 MG tablet Commonly known as: TYLENOL Take 650 mg by mouth every Monday, Wednesday, and Friday with hemodialysis. What changed: Another medication with the same name was removed. Continue taking this medication, and follow the directions you see here.   albuterol 108 (90 Base) MCG/ACT inhaler Commonly known as: VENTOLIN HFA Inhale 2 puffs into the lungs every 4 (four) hours as needed for wheezing or shortness of breath.   aspirin 81 MG chewable tablet Chew 1 tablet (81 mg total) by mouth daily.   calcium acetate 667 MG capsule Commonly known as: PHOSLO Take 2 capsules (1,334 mg total) by mouth 2 (two) times daily with a meal.   cetaphil lotion Apply 1 application topically daily. To left heel   cetirizine 10 MG tablet Commonly known as: ZYRTEC Take 10 mg by mouth daily.   chlorhexidine 0.12 % solution Commonly known as: PERIDEX Use as directed 15 mLs in the mouth or throat 2 (two) times daily. Rinse for 1 min then spit   cinacalcet 60 MG tablet Commonly known as: SENSIPAR Take 1 tablet (60 mg total) by mouth daily. What changed: how much  to take   cloNIDine 0.1 MG tablet Commonly known as: CATAPRES Take 1 tablet (0.1 mg total) by mouth 2 (two) times daily.   divalproex 500 MG DR tablet Commonly known as: DEPAKOTE Take 2 tablets (1,000 mg total) by mouth at bedtime. What changed:   how much to take  when to take this  additional instructions   docusate sodium 100 MG capsule Commonly known as: COLACE Take 1 capsule (100 mg total) by mouth at bedtime.   escitalopram 5 MG tablet Commonly known as: LEXAPRO Take 5 mg by mouth daily. Takes along with 10mg  totally 15mg  daily. What changed: Another medication with the same name was changed. Make sure you understand how and when to take each.   escitalopram 10 MG tablet Commonly known as: LEXAPRO Take 1.5  tablets (15 mg total) by mouth daily. What changed:   how much to take  additional instructions   fluticasone 110 MCG/ACT inhaler Commonly known as: FLOVENT HFA Inhale 2 puffs into the lungs 2 (two) times daily.   folic acid 1 MG tablet Commonly known as: FOLVITE Take 2 tablets (2 mg total) by mouth at bedtime.   Glucagon Emergency 1 MG injection Generic drug: glucagon Inject 1 mg into the vein once as needed (low blood sugar).   insulin aspart 100 UNIT/ML injection Commonly known as: novoLOG Inject 0-15 Units into the skin 3 (three) times daily with meals. Per sliding scale   insulin glargine 100 UNIT/ML injection Commonly known as: LANTUS Inject 0.1 mLs (10 Units total) into the skin at bedtime. What changed: how much to take   [place] 156 MG/ML Susy injection Generic drug: paliperidone Inject 156 mg into the muscle See admin instructions. EVERY 28 DAYS What changed: Another medication with the same name was removed. Continue taking this medication, and follow the directions you see here.   lamoTRIgine 150 MG tablet Commonly known as: LAMICTAL Take 150 mg by mouth 2 (two) times daily.   lidocaine-prilocaine cream Commonly known as: EMLA Apply 1 application topically 3 (three) times a week. Apply to access site 30 minutes before dialysis. What changed: when to take this   medroxyPROGESTERone 150 MG/ML injection Commonly known as: DEPO-PROVERA Inject 150 mg into the muscle every 3 (three) months.   metoprolol tartrate 50 MG tablet Commonly known as: LOPRESSOR Take 1 tablet (50 mg total) by mouth 2 (two) times daily.   oxyCODONE-acetaminophen 5-325 MG tablet Commonly known as: PERCOCET/ROXICET Take 1 tablet by mouth every 6 (six) hours as needed for severe pain.   pantoprazole 40 MG tablet Commonly known as: PROTONIX Take 40 mg by mouth daily.   patiromer 8.4 g packet Commonly known as: VELTASSA Take 1 packet (8.4 g total) by mouth daily. What  changed:   when to take this  additional instructions   Rena-Vite Rx 1 MG Tabs Take 1 tablet by mouth daily.   Stiolto Respimat 2.5-2.5 MCG/ACT Aers Generic drug: Tiotropium Bromide-Olodaterol Inhale 2 puffs into the lungs daily.   [place] Tabs Take 1 tablet by mouth daily.   traZODone 150 MG tablet Commonly known as: DESYREL Take 150 mg by mouth at bedtime. What changed: Another medication with the same name was removed. Continue taking this medication, and follow the directions you see here.       No Known Allergies  Consultations: Nephrology  Procedures/Studies: Dg Ankle Complete Right  Result Date: 05/20/2019 CLINICAL DATA:  Initial evaluation for acute pain status post recent fall. EXAM: RIGHT ANKLE - COMPLETE  3+ VIEW COMPARISON:  None. FINDINGS: There is an acute oblique comminuted fracture of the distal right tibial shaft with approximate 5 mm of lateral displacement. Overlying soft tissue injury, which could reflect an open fracture, although no soft tissue emphysema identified. No definite radiopaque foreign body. Adjacent fibula intact. Ankle mortise remains approximated. Osteoarthritic changes noted about the ankle. Advanced atherosclerotic change for patient age. IMPRESSION: 1. Acute oblique comminuted open fracture of the distal right tibial shaft with approximate 5 mm of lateral displacement. 2. Overlying soft tissue injury, suggesting that this could potentially be an open fracture. Correlation with physical exam recommended. Electronically Signed   By: Jeannine Boga M.D.   On: 05/20/2019 22:08   Ct Head Wo Contrast  Result Date: 05/24/2019 CLINICAL DATA:  38 y/o F; altered mental status, unclear cause. Missed dialysis yesterday, lethargy and not eating per family. EXAM: CT HEAD WITHOUT CONTRAST TECHNIQUE: Contiguous axial images were obtained from the base of the skull through the vertex without intravenous contrast. COMPARISON:  10/26/2018 CT head FINDINGS:  Brain: No evidence of acute infarction, hemorrhage, hydrocephalus, extra-axial collection or mass lesion/mass effect. 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 Stable nonspecific white matter hypodensities compatible with chronic microvascular ischemic changes and stable volume loss of the brain. Vascular: Calcific atherosclerosis of the internal carotid arteries. No hyperdense vessel. Skull: Normal. Negative for fracture or focal lesion. Sinuses/Orbits: No acute finding. Other: Multiple nodules are arising from the right greater than left outer ears. IMPRESSION: 1. No acute intracranial abnormality identified. 2. Stable chronic microvascular ischemic changes and parenchymal volume loss of the brain. 3. 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 4. Multiple nodules arising from the right greater than left outer ears. Electronically Signed   By: Kristine Garbe M.D.   On: 05/24/2019 19:17   Dg Chest Port 1 View  Result Date: 05/24/2019 CLINICAL DATA:  Shortness of breath EXAM: PORTABLE CHEST 1 VIEW COMPARISON:  01/20/2019 FINDINGS: Cardiomegaly. Left upper extremity HERO type vascular graft. Both lungs are clear. The visualized skeletal structures are unremarkable. IMPRESSION: Cardiomegaly without acute abnormality of the lungs in AP portable projection. Electronically Signed   By: Eddie Candle M.D.   On: 05/24/2019 19:02   Dg Foot Complete Right  Result Date: 05/20/2019 CLINICAL DATA:  Initial evaluation for acute pain, recent fall. EXAM: RIGHT FOOT COMPLETE - 3+ VIEW COMPARISON:  Prior radiograph from 09/10/2017. FINDINGS: Comminuted and mildly displaced fracture of the distal right tibial shaft, better evaluated on concomitant ankle radiograph. Subtle linear lucencies with sclerotic margins extends through the bases of the right third and fourth metatarsals, and possibly fifth metatarsal as well, suggesting possible stress fractures.  Findings are age indeterminate, but suspected to at least be subacute in nature. No other acute fracture or dislocation. Scattered osteoarthritic changes about the foot, most notable at the first MTP joint. Advanced osteopenia for age. Advanced atherosclerotic change for age noted as well. Diffuse soft tissue swelling about the foot. IMPRESSION: 1. Comminuted and mildly displaced fracture of the distal right tibial shaft, better evaluated on concomitant ankle radiograph. 2. Subtle linear lucencies with sclerotic margins extending through the bases of the right third and fourth metatarsals, and possibly fifth metatarsal, suggesting possible stress fractures. Findings are age indeterminate, but suspected to at least be subacute in nature. Correlation with physical exam recommended. 3. Diffuse soft tissue swelling about the foot. 4. Advanced atherosclerosis for age. Electronically Signed   By: Pincus Badder.D.  On: 05/20/2019 22:16   Vas US Duplex Dialysis Access (avf, Avg)  Result Date: 05/14/2019 DIALYSIS ACCESS Access Site: Left Upper Extremity. Access Type: AVG/HERO. History: HeRO graft placed 04/21/2016. Comparison Study: 11/06/2018 Performing Technologist: Almira Coaster RVS  Examination Guidelines: A complete evaluation includes B-mode imaging, spectral Doppler, color Doppler, and power Doppler as needed of all accessible portions of each vessel. Unilateral testing is considered an integral part of a complete examination. Limited examinations for reoccurring indications may be performed as noted.  Findings:  +---------------+----------+-------------+----------+--------+ OUTFLOW VEIN   PSV (cm/s)Diameter (cm)Depth (cm)Describe +---------------+----------+-------------+----------+--------+ Subclavian vein   246                                    +---------------+----------+-------------+----------+--------+ Confluence        215                                     +---------------+----------+-------------+----------+--------+ Clavicle          316                                    +---------------+----------+-------------+----------+--------+  +--------------------+----------+-----------------+--------+ AVG                 PSV (cm/s)Flow Vol (mL/min)Describe +--------------------+----------+-----------------+--------+ Native artery inflow   169          2411                +--------------------+----------+-----------------+--------+ Arterial anastomosis   272                              +--------------------+----------+-----------------+--------+ Prox graft             284                              +--------------------+----------+-----------------+--------+ Mid graft              298                              +--------------------+----------+-----------------+--------+ Distal graft           180                              +--------------------+----------+-----------------+--------+  Summary: The Left Upper Extremity AVG/HERO Graft appears to be patent throughout.  *See table(s) above for measurements and observations.  Diagnosing physician: Hortencia Pilar MD Electronically signed by Hortencia Pilar MD on 05/14/2019 at 4:42:12 PM.    --------------------------------------------------------------------------------   Final    Vas [place] Lower Extremity Venous (dvt) (only [place])  Result Date: 05/21/2019  Lower Venous Study Indications: Pain, and Swelling.  Comparison Study: No comparison available Performing Technologist: Toma Copier RVS  Examination Guidelines: A complete evaluation includes B-mode imaging, spectral Doppler, color Doppler, and power Doppler as needed of all accessible portions of each vessel. Bilateral testing is considered an integral part of a complete examination. Limited examinations for reoccurring indications may be performed as noted.   +---------+---------------+---------+-----------+----------+-------+ RIGHT    CompressibilityPhasicitySpontaneityPropertiesSummary +---------+---------------+---------+-----------+----------+-------+ CFV      Full  Yes      Yes                          +---------+---------------+---------+-----------+----------+-------+ SFJ      Full                                                 +---------+---------------+---------+-----------+----------+-------+ FV Prox  Full           Yes      Yes                          +---------+---------------+---------+-----------+----------+-------+ FV Mid   Full                                                 +---------+---------------+---------+-----------+----------+-------+ FV DistalFull           Yes      Yes                          +---------+---------------+---------+-----------+----------+-------+ PFV      Full           Yes      Yes                          +---------+---------------+---------+-----------+----------+-------+ POP      Full           Yes      Yes                          +---------+---------------+---------+-----------+----------+-------+ PTV      Full                                                 +---------+---------------+---------+-----------+----------+-------+ PERO     Full                                                 +---------+---------------+---------+-----------+----------+-------+   +----+---------------+---------+-----------+----------+-------+ LEFTCompressibilityPhasicitySpontaneityPropertiesSummary +----+---------------+---------+-----------+----------+-------+ CFV Full           Yes      Yes                          +----+---------------+---------+-----------+----------+-------+ SFJ Full                                                 +----+---------------+---------+-----------+----------+-------+     Summary: Right: There is no evidence of deep  vein thrombosis in the lower extremity. No cystic structure found in the popliteal fossa. Left: No evidence of a common femoral vein obstruction  *See table(s) above for measurements and observations. Electronically signed by Servando Snare MD on 05/21/2019 at  1:22:29 PM.    Final     (Echo, Carotid, EGD, Colonoscopy, ERCP)    Subjective:  She is resting in bed she reports she wants to eat ice cream Discharge Exam: Vitals:   06/01/19 0740 06/01/19 0829  BP:  (!) 101/41  Pulse:  96  Resp:  20  Temp:  98.4 F (36.9 C)  SpO2: 94% 98%   Vitals:   06/01/19 0356 06/01/19 0400 06/01/19 0740 06/01/19 0829  BP: (!) 84/51 (!) 101/52  (!) 101/41  Pulse: 87   96  Resp: 16   20  Temp: 98.7 F (37.1 C)   98.4 F (36.9 C)  TempSrc: Oral   Oral  SpO2: 97%  94% 98%  Weight:      Height:        General: Pt is alert, awake, not in acute distress Cardiovascular: RRR, S1/S2 +, no rubs, no gallops Respiratory: CTA bilaterally, no wheezing, no rhonchi Abdominal: Soft, NT, ND, bowel sounds + Extremities: no edema, no cyanosis    The results of significant diagnostics from this hospitalization (including imaging, microbiology, ancillary and laboratory) are listed below for reference.     Microbiology: Recent Results (from the past 240 hour(s))  SARS Coronavirus 2 (CEPHEID- Performed in [facility] hospital lab), Hosp Order     Status: None   Collection Time: 05/24/19  6:15 PM   Specimen: Nasopharyngeal Swab  Result Value Ref Range Status   SARS Coronavirus 2 NEGATIVE NEGATIVE Final    Comment: (NOTE) If result is NEGATIVE SARS-CoV-2 target nucleic acids are NOT DETECTED. The SARS-CoV-2 RNA is generally detectable in upper and lower  respiratory specimens during the acute phase of infection. The lowest  concentration of SARS-CoV-2 viral copies this assay can detect is 250  copies / mL. A negative result does not preclude SARS-CoV-2 infection  and should not be used as the sole basis for  treatment or other  patient management decisions.  A negative result may occur with  improper specimen collection / handling, submission of specimen other  than nasopharyngeal swab, presence of viral mutation(s) within the  areas targeted by this assay, and inadequate number of viral copies  (<250 copies / mL). A negative result must be combined with clinical  observations, patient history, and epidemiological information. If result is POSITIVE SARS-CoV-2 target nucleic acids are DETECTED. The SARS-CoV-2 RNA is generally detectable in upper and lower  respiratory specimens dur ing the acute phase of infection.  Positive  results are indicative of active infection with SARS-CoV-2.  Clinical  correlation with patient history and other diagnostic information is  necessary to determine patient infection status.  Positive results do  not rule out bacterial infection or co-infection with other viruses. If result is PRESUMPTIVE POSTIVE SARS-CoV-2 nucleic acids MAY BE PRESENT.   A presumptive positive result was obtained on the submitted specimen  and confirmed on repeat testing.  While 2019 novel coronavirus  (SARS-CoV-2) nucleic acids may be present in the submitted sample  additional confirmatory testing may be necessary for epidemiological  and / or clinical management purposes  to differentiate between  SARS-CoV-2 and other Sarbecovirus currently known to infect humans.  If clinically indicated additional testing with an alternate test  methodology [phone]) is advised. The SARS-CoV-2 RNA is generally  detectable in upper and lower respiratory sp ecimens during the acute  phase of infection. The expected result is Negative. Fact Sheet for Patients:  StrictlyIdeas.no Fact Sheet for Healthcare Providers: BankingDealers.co.za This test is  not yet approved or cleared by the [place] and has been authorized for detection and/or  diagnosis of SARS-CoV-2 by FDA under an Emergency Use Authorization (EUA).  This EUA will remain in effect (meaning this test can be used) for the duration of the COVID-19 declaration under Section 564(b)(1) of the Act, 21 U.S.C. section 360bbb-3(b)(1), unless the authorization is terminated or revoked sooner. Performed at [facility], [place] [address], [place], [place] [address]      Labs: BNP (last 3 results) No results for input(s): BNP in the last 8760 hours. Basic Metabolic Panel: Recent Labs  Lab 05/27/19 1445 05/28/19 0424 05/29/19 1545 05/30/19 1807 06/01/19 0651  NA 132*  132* 134* 127* 126* 127*  K 3.6  3.6 3.5 3.5 4.8 4.2  CL 91*  90* 93* 86* 90* 88*  CO2 26  25 27 28  21* 27  GLUCOSE 101*  100* 166* 137* 164* 169*  BUN 54*  52* 20 31* 25* 39*  CREATININE 13.83*  13.54* 7.56* 10.05* 7.16* 9.26*  CALCIUM 9.7  9.8  9.6 10.0 9.7 10.1 11.1*  PHOS 4.8*  --  4.5 3.7  --    Liver Function Tests: Recent Labs  Lab 05/27/19 1445 05/29/19 1545 05/30/19 1807  ALBUMIN 2.1* 2.3* 2.3*   No results for input(s): LIPASE, AMYLASE in the last 168 hours. No results for input(s): AMMONIA in the last 168 hours. CBC: Recent Labs  Lab 05/25/19 1515 05/26/19 0509 05/27/19 1445 05/28/19 0424 05/29/19 1545  WBC  --  6.7 5.9 7.0 7.5  NEUTROABS  --  3.3 3.7 4.0  --   HGB 8.3* 8.4* 8.4* 8.7* 8.2*  HCT 24.6* 27.0* 25.6* 27.1* 25.5*  MCV  --  98.5 96.6 97.5 96.6  PLT  --  250 253 257 241   Cardiac Enzymes: No results for input(s): CKTOTAL, CKMB, CKMBINDEX, TROPONINI in the last 168 hours. BNP: Invalid input(s): POCBNP CBG: Recent Labs  Lab 05/31/19 0522 05/31/19 0814 05/31/19 1244 05/31/19 1709 05/31/19 1948  GLUCAP 211* 183* 106* 126* 140*   D-Dimer No results for input(s): DDIMER in the last 72 hours. Hgb A1c No results for input(s): HGBA1C in the last 72 hours. Lipid Profile No results for input(s): CHOL, HDL, LDLCALC, TRIG, CHOLHDL, LDLDIRECT in  the last 72 hours. Thyroid function studies No results for input(s): TSH, T4TOTAL, T3FREE, THYROIDAB in the last 72 hours.  Invalid input(s): FREET3 Anemia work up No results for input(s): VITAMINB12, FOLATE, FERRITIN, TIBC, IRON, RETICCTPCT in the last 72 hours. Urinalysis    Component Value Date/Time   COLORURINE YELLOW (A) 01/18/2016 1332   APPEARANCEUR HAZY (A) 01/18/2016 1332   APPEARANCEUR HAZY 06/03/2014 2028   LABSPEC 1.008 01/18/2016 1332   LABSPEC 1.005 06/03/2014 2028   PHURINE 9.0 (H) 01/18/2016 1332   GLUCOSEU >500 (A) 01/18/2016 1332   GLUCOSEU 500 mg/dL 06/03/2014 2028   HGBUR NEGATIVE 01/18/2016 1332   BILIRUBINUR NEGATIVE 01/18/2016 1332   BILIRUBINUR NEGATIVE 06/03/2014 2028   KETONESUR NEGATIVE 01/18/2016 1332   PROTEINUR 100 (A) 01/18/2016 1332   NITRITE NEGATIVE 01/18/2016 1332   LEUKOCYTESUR TRACE (A) 01/18/2016 1332   LEUKOCYTESUR NEGATIVE 06/03/2014 2028   Sepsis Labs Invalid input(s): PROCALCITONIN,  WBC,  LACTICIDVEN Microbiology Recent Results (from the past 240 hour(s))  SARS Coronavirus 2 (CEPHEID- Performed in [place] hospital lab), Hosp Order     Status: None   Collection Time: 05/24/19  6:15 PM   Specimen: Nasopharyngeal Swab  Result Value  Ref Range Status   SARS Coronavirus 2 NEGATIVE NEGATIVE Final    Comment: (NOTE) If result is NEGATIVE SARS-CoV-2 target nucleic acids are NOT DETECTED. The SARS-CoV-2 RNA is generally detectable in upper and lower  respiratory specimens during the acute phase of infection. The lowest  concentration of SARS-CoV-2 viral copies this assay can detect is 250  copies / mL. A negative result does not preclude SARS-CoV-2 infection  and should not be used as the sole basis for treatment or other  patient management decisions.  A negative result may occur with  improper specimen collection / handling, submission of specimen other  than nasopharyngeal swab, presence of viral mutation(s) within the  areas  targeted by this assay, and inadequate number of viral copies  (<250 copies / mL). A negative result must be combined with clinical  observations, patient history, and epidemiological information. If result is POSITIVE SARS-CoV-2 target nucleic acids are DETECTED. The SARS-CoV-2 RNA is generally detectable in upper and lower  respiratory specimens dur ing the acute phase of infection.  Positive  results are indicative of active infection with SARS-CoV-2.  Clinical  correlation with patient history and other diagnostic information is  necessary to determine patient infection status.  Positive results do  not rule out bacterial infection or co-infection with other viruses. If result is PRESUMPTIVE POSTIVE SARS-CoV-2 nucleic acids MAY BE PRESENT.   A presumptive positive result was obtained on the submitted specimen  and confirmed on repeat testing.  While 2019 novel coronavirus  (SARS-CoV-2) nucleic acids may be present in the submitted sample  additional confirmatory testing may be necessary for epidemiological  and / or clinical management purposes  to differentiate between  SARS-CoV-2 and other Sarbecovirus currently known to infect humans.  If clinically indicated additional testing with an alternate test  methodology [phone]) is advised. The SARS-CoV-2 RNA is generally  detectable in upper and lower respiratory sp ecimens during the acute  phase of infection. The expected result is Negative. Fact Sheet for Patients:  StrictlyIdeas.no Fact Sheet for Healthcare Providers: BankingDealers.co.za This test is not yet approved or cleared by the [place] FDA and has been authorized for detection and/or diagnosis of SARS-CoV-2 by FDA under an Emergency Use Authorization (EUA).  This EUA will remain in effect (meaning this test can be used) for the duration of the COVID-19 declaration under Section 564(b)(1) of the Act, 21 U.S.C. section  360bbb-3(b)(1), unless the authorization is terminated or revoked sooner. Performed at [facility], [place] [address] [place], [place] [address]      Time coordinating discharge:  35 minutes  SIGNED:   Georgette Shell, MD  [facility] 06/01/2019, 10:37 AM Pager   If 7PM-7AM, please contact night-coverage [email or web address] Password [email or web address]

## 2019-06-01 NOTE — Progress Notes (Signed)
Renal Navigator notified OP HD clinic/[facility] of patient's plan for discharge today. Renal Navigator faxed discharge summary and 06/01/19 Renal Note to OP HD clinic to provide continuity of care.  Alphonzo Cruise, [place] Renal Navigator  [phone]

## 2019-06-01 NOTE — Progress Notes (Signed)
Patient dressed, lines removed, belongings packed. Patient taken by nurse via wheelchair to group home's representative's vehicle for discharge. AVS reviewed with patient and patient given a copy along with prescription.

## 2019-06-02 LAB — GLUCOSE, CAPILLARY: Glucose-Capillary: 152 mg/dL — ABNORMAL HIGH (ref 70–99)

## 2019-06-16 ENCOUNTER — Encounter (HOSPITAL_COMMUNITY): Payer: Self-pay | Admitting: *Deleted

## 2019-06-16 ENCOUNTER — Other Ambulatory Visit (HOSPITAL_COMMUNITY)
Admission: RE | Admit: 2019-06-16 | Discharge: 2019-06-16 | Disposition: A | Discharge status: Home / Self Care | Payer: Medicaid Other | Source: Ambulatory Visit | Attending: Orthopedic Surgery | Admitting: Orthopedic Surgery

## 2019-06-16 ENCOUNTER — Ambulatory Visit: Payer: Medicaid Other | Admitting: Podiatry

## 2019-06-16 ENCOUNTER — Other Ambulatory Visit: Payer: Self-pay

## 2019-06-16 DIAGNOSIS — Z1159 Encounter for screening for other viral diseases: Secondary | ICD-10-CM | POA: Insufficient documentation

## 2019-06-16 LAB — SARS CORONAVIRUS 2 (TAT 6-24 HRS): SARS Coronavirus 2: NEGATIVE

## 2019-06-16 NOTE — Progress Notes (Addendum)
Pt is a resident a group home owned and operated by [employer] and [employer]. I spoke with Horris Latino for pt's pre-op call. Pt has intellectual disabilities. Horris Latino sometimes pt can answer questions but most of the time pt "doesn't make sense". Pt is diabetic, type 2 last A1C was 6.3 on 05/25/19. Horris Latino states pt's fasting blood sugar is between 82-118. Horris Latino will fax me the med list and then I will fax her the pre-op instructions. [name]'s husband, Romulus will be with pt when she comes in for surgery. I have also spoken to Porfirio Mylar, legal guardian for the patient. She asked that we call her cell # [phone] to do a telephone consent.   Pt had her covid test done today. They are attempting to quarantine her as best as they can.   Coronavirus Screening  Have you experienced the following symptoms:  Cough NO Fever (>100.64F)  NO Runny nose NO Sore throat NO Difficulty breathing/shortness of breath NO   Have you or a family member traveled in the last 14 days and where? NO    Horris Latino was reminded that hospital visitation restrictions are in effect and the importance of the restrictions. I did explain that [name] will be able to be with patient in pre-op due to her intellectual disabilities and that if he chooses, he may wait in the waiting room.   MAR received and pre-op instructions faxed to [phone]

## 2019-06-16 NOTE — Pre-Procedure Instructions (Addendum)
    Vanice Sarah Kathlene November  06/16/2019    Ms. Murri's procedure is scheduled on Wednesday, 06/17/19 at 12:45 PM.   Report to [facility] Entrance "A" Admitting Office at 10:15 AM.   Call this number if you have problems the morning of surgery: [phone]   Remember:  Patient is not to eat or drink after midnight tonight.  Take these medicines the morning of surgery with A SIP OF WATER: Cetirizine (Zyrtec), Divalproex (Depakote), Escitalopram (Lexapro), Lamotrigine (Lamictal), Pantoprazole (Protonix), Oxycodone - if needed, Stiolto inhaler, Flovent inhaler, Albuterol inhaler - if needed (bring this inhaler day of surgery).  Please give Ms. Simoneau 1/2 of her regular dose of Lantus insulin tonight - give 10 units.  Check her blood sugar in the AM when she gets up and every 2 hours until she leaves for the hospital. If blood sugar is >220 take 1/2 of usual correction dose of Novolog insulin. If blood sugar is 70 or below, treat with 1/2 cup of clear juice (apple or cranberry) and recheck blood sugar 15 minutes after drinking juice. If blood sugar continues to be 70 or below, call the Short Stay department and ask to speak to a nurse.  Do NOT smoke 24 hours prior to surgery.   Do not wear jewelry, make-up or nail polish.  Do not wear lotions, powders, perfumes or deodorant.  Do not shave 48 hours prior to surgery.    Do not bring valuables to the hospital.  [facility] is not responsible for any belongings or valuables.  Contacts, dentures or bridgework may not be worn into surgery.    Patients discharged the day of surgery will not be allowed to drive home.   Any questions today, please call Lilia Pro, RN [phone]

## 2019-06-17 ENCOUNTER — Inpatient Hospital Stay (HOSPITAL_COMMUNITY)
Admission: RE | Admit: 2019-06-17 | Discharge: 2019-06-25 | DRG: 492 | Disposition: A | Discharge status: Home Health | Payer: Medicaid Other | Attending: Orthopedic Surgery | Admitting: Orthopedic Surgery

## 2019-06-17 ENCOUNTER — Inpatient Hospital Stay (HOSPITAL_COMMUNITY): Payer: Medicaid Other | Admitting: Certified Registered Nurse Anesthetist

## 2019-06-17 ENCOUNTER — Inpatient Hospital Stay (HOSPITAL_COMMUNITY): Payer: Medicaid Other

## 2019-06-17 ENCOUNTER — Encounter (HOSPITAL_COMMUNITY)
Admission: RE | Disposition: A | Discharge status: Home Health | Payer: Self-pay | Source: Home / Self Care | Attending: Orthopedic Surgery

## 2019-06-17 ENCOUNTER — Encounter (HOSPITAL_COMMUNITY): Payer: Self-pay

## 2019-06-17 DIAGNOSIS — Z79899 Other long term (current) drug therapy: Secondary | ICD-10-CM

## 2019-06-17 DIAGNOSIS — D631 Anemia in chronic kidney disease: Secondary | ICD-10-CM | POA: Diagnosis present

## 2019-06-17 DIAGNOSIS — F17210 Nicotine dependence, cigarettes, uncomplicated: Secondary | ICD-10-CM | POA: Diagnosis present

## 2019-06-17 DIAGNOSIS — E669 Obesity, unspecified: Secondary | ICD-10-CM | POA: Diagnosis present

## 2019-06-17 DIAGNOSIS — F70 Mild intellectual disabilities: Secondary | ICD-10-CM | POA: Diagnosis present

## 2019-06-17 DIAGNOSIS — W19XXXA Unspecified fall, initial encounter: Secondary | ICD-10-CM | POA: Diagnosis present

## 2019-06-17 DIAGNOSIS — N186 End stage renal disease: Secondary | ICD-10-CM

## 2019-06-17 DIAGNOSIS — Z7951 Long term (current) use of inhaled steroids: Secondary | ICD-10-CM

## 2019-06-17 DIAGNOSIS — D62 Acute posthemorrhagic anemia: Secondary | ICD-10-CM | POA: Diagnosis not present

## 2019-06-17 DIAGNOSIS — Z7982 Long term (current) use of aspirin: Secondary | ICD-10-CM | POA: Diagnosis not present

## 2019-06-17 DIAGNOSIS — I10 Essential (primary) hypertension: Secondary | ICD-10-CM | POA: Diagnosis present

## 2019-06-17 DIAGNOSIS — E876 Hypokalemia: Secondary | ICD-10-CM | POA: Diagnosis present

## 2019-06-17 DIAGNOSIS — S82402A Unspecified fracture of shaft of left fibula, initial encounter for closed fracture: Secondary | ICD-10-CM | POA: Diagnosis present

## 2019-06-17 DIAGNOSIS — Z6834 Body mass index (BMI) 34.0-34.9, adult: Secondary | ICD-10-CM | POA: Diagnosis not present

## 2019-06-17 DIAGNOSIS — Z794 Long term (current) use of insulin: Secondary | ICD-10-CM

## 2019-06-17 DIAGNOSIS — E119 Type 2 diabetes mellitus without complications: Secondary | ICD-10-CM

## 2019-06-17 DIAGNOSIS — Z20828 Contact with and (suspected) exposure to other viral communicable diseases: Secondary | ICD-10-CM | POA: Diagnosis present

## 2019-06-17 DIAGNOSIS — J449 Chronic obstructive pulmonary disease, unspecified: Secondary | ICD-10-CM | POA: Diagnosis present

## 2019-06-17 DIAGNOSIS — I120 Hypertensive chronic kidney disease with stage 5 chronic kidney disease or end stage renal disease: Secondary | ICD-10-CM | POA: Diagnosis present

## 2019-06-17 DIAGNOSIS — N2581 Secondary hyperparathyroidism of renal origin: Secondary | ICD-10-CM | POA: Diagnosis present

## 2019-06-17 DIAGNOSIS — R000 Tachycardia, unspecified: Secondary | ICD-10-CM | POA: Clinically undetermined

## 2019-06-17 DIAGNOSIS — Z419 Encounter for procedure for purposes other than remedying health state, unspecified: Secondary | ICD-10-CM | POA: Diagnosis not present

## 2019-06-17 DIAGNOSIS — Z992 Dependence on renal dialysis: Secondary | ICD-10-CM | POA: Diagnosis not present

## 2019-06-17 DIAGNOSIS — S82301A Unspecified fracture of lower end of right tibia, initial encounter for closed fracture: Principal | ICD-10-CM | POA: Diagnosis present

## 2019-06-17 DIAGNOSIS — D649 Anemia, unspecified: Secondary | ICD-10-CM | POA: Diagnosis present

## 2019-06-17 DIAGNOSIS — F319 Bipolar disorder, unspecified: Secondary | ICD-10-CM | POA: Diagnosis present

## 2019-06-17 DIAGNOSIS — E1122 Type 2 diabetes mellitus with diabetic chronic kidney disease: Secondary | ICD-10-CM | POA: Diagnosis present

## 2019-06-17 DIAGNOSIS — K219 Gastro-esophageal reflux disease without esophagitis: Secondary | ICD-10-CM | POA: Diagnosis present

## 2019-06-17 DIAGNOSIS — R5383 Other fatigue: Secondary | ICD-10-CM | POA: Diagnosis not present

## 2019-06-17 DIAGNOSIS — S82301K Unspecified fracture of lower end of right tibia, subsequent encounter for closed fracture with nonunion: Secondary | ICD-10-CM

## 2019-06-17 HISTORY — DX: Anemia, unspecified: D64.9

## 2019-06-17 HISTORY — DX: Unspecified intellectual disabilities: F79

## 2019-06-17 HISTORY — PX: TIBIA IM NAIL INSERTION: SHX2516

## 2019-06-17 LAB — GLUCOSE, CAPILLARY
Glucose-Capillary: 93 mg/dL (ref 70–99)
Glucose-Capillary: 137 mg/dL — ABNORMAL HIGH (ref 70–99)
Glucose-Capillary: 123 mg/dL — ABNORMAL HIGH (ref 70–99)
Glucose-Capillary: 167 mg/dL — ABNORMAL HIGH (ref 70–99)

## 2019-06-17 LAB — CBC
HCT: 28.5 % — ABNORMAL LOW (ref 36.0–46.0)
Hemoglobin: 8.7 g/dL — ABNORMAL LOW (ref 12.0–15.0)
MCH: 31.4 pg (ref 26.0–34.0)
MCHC: 30.5 g/dL (ref 30.0–36.0)
MCV: 102.9 fL — ABNORMAL HIGH (ref 80.0–100.0)
Platelets: 224 10*3/uL (ref 150–400)
RBC: 2.77 MIL/uL — ABNORMAL LOW (ref 3.87–5.11)
RDW: 15.4 % (ref 11.5–15.5)
WBC: 5.7 10*3/uL (ref 4.0–10.5)
nRBC: 0 % (ref 0.0–0.2)

## 2019-06-17 LAB — BASIC METABOLIC PANEL
Anion gap: 13 (ref 5–15)
BUN: 36 mg/dL — ABNORMAL HIGH (ref 6–20)
CO2: 29 mmol/L (ref 22–32)
Calcium: 10.4 mg/dL — ABNORMAL HIGH (ref 8.9–10.3)
Chloride: 98 mmol/L (ref 98–111)
Creatinine, Ser: 9.08 mg/dL — ABNORMAL HIGH (ref 0.44–1.00)
GFR calc Af Amer: 6 mL/min — ABNORMAL LOW (ref >60)
GFR calc non Af Amer: 5 mL/min — ABNORMAL LOW (ref >60)
Glucose, Bld: 110 mg/dL — ABNORMAL HIGH (ref 70–99)
Potassium: 3.2 mmol/L — ABNORMAL LOW (ref 3.5–5.1)
Sodium: 140 mmol/L (ref 135–145)

## 2019-06-17 LAB — HCG, SERUM, QUALITATIVE: Preg, Serum: NEGATIVE

## 2019-06-17 SURGERY — INSERTION, INTRAMEDULLARY ROD, TIBIA
Anesthesia: General | Site: Leg Lower | Laterality: Right

## 2019-06-17 MED ORDER — DIVALPROEX SODIUM ER 500 MG PO TB24
1000.0000 mg | ORAL_TABLET | Freq: Every evening | ORAL | Status: DC
  Administered 2019-06-17 – 2019-06-25 (×8): 1000 mg via ORAL
  Filled 2019-06-17 (×8): qty 2

## 2019-06-17 MED ORDER — MIDAZOLAM HCL 2 MG/2ML IJ SOLN
INTRAMUSCULAR | Status: DC | PRN
  Administered 2019-06-17: 1 mg via INTRAVENOUS

## 2019-06-17 MED ORDER — PROPOFOL 10 MG/ML IV BOLUS
INTRAVENOUS | Status: DC | PRN
  Administered 2019-06-17: 200 mg via INTRAVENOUS

## 2019-06-17 MED ORDER — ASPIRIN 81 MG PO CHEW
81.0000 mg | CHEWABLE_TABLET | Freq: Every day | ORAL | Status: DC
  Administered 2019-06-19 – 2019-06-25 (×7): 81 mg via ORAL
  Filled 2019-06-17 (×8): qty 1

## 2019-06-17 MED ORDER — PROPOFOL 10 MG/ML IV BOLUS
INTRAVENOUS | Status: AC
  Filled 2019-06-17: qty 20

## 2019-06-17 MED ORDER — CEFAZOLIN SODIUM-DEXTROSE 1-4 GM/50ML-% IV SOLN
1.0000 g | INTRAVENOUS | Status: AC
  Administered 2019-06-17 – 2019-06-18 (×2): 1 g via INTRAVENOUS
  Filled 2019-06-17 (×3): qty 50

## 2019-06-17 MED ORDER — FOLIC ACID 1 MG PO TABS
2.0000 mg | ORAL_TABLET | Freq: Every day | ORAL | Status: DC
  Administered 2019-06-17 – 2019-06-24 (×8): 2 mg via ORAL
  Filled 2019-06-17 (×10): qty 2

## 2019-06-17 MED ORDER — ACETAMINOPHEN 500 MG PO TABS
500.0000 mg | ORAL_TABLET | Freq: Four times a day (QID) | ORAL | Status: AC
  Administered 2019-06-17: 500 mg via ORAL
  Filled 2019-06-17 (×3): qty 1

## 2019-06-17 MED ORDER — ONDANSETRON HCL 4 MG PO TABS: 4.0000 mg | ORAL_TABLET | Freq: Four times a day (QID) | ORAL | Status: DC | PRN

## 2019-06-17 MED ORDER — OXYCODONE HCL 5 MG PO TABS: 5.0000 mg | ORAL_TABLET | Freq: Once | ORAL | Status: DC | PRN

## 2019-06-17 MED ORDER — CHLORHEXIDINE GLUCONATE CLOTH 2 % EX PADS
6.0000 | MEDICATED_PAD | Freq: Every day | CUTANEOUS | Status: DC
  Administered 2019-06-19 – 2019-06-24 (×4): 6 via TOPICAL

## 2019-06-17 MED ORDER — DIVALPROEX SODIUM 500 MG PO DR TAB
500.0000 mg | DELAYED_RELEASE_TABLET | Freq: Every morning | ORAL | Status: DC
  Administered 2019-06-19 – 2019-06-25 (×7): 500 mg via ORAL
  Filled 2019-06-17 (×8): qty 1

## 2019-06-17 MED ORDER — DOCUSATE SODIUM 100 MG PO CAPS
100.0000 mg | ORAL_CAPSULE | Freq: Every day | ORAL | Status: DC
  Administered 2019-06-17 – 2019-06-24 (×8): 100 mg via ORAL
  Filled 2019-06-17 (×8): qty 1

## 2019-06-17 MED ORDER — OXYCODONE HCL 5 MG/5ML PO SOLN: 5.0000 mg | Freq: Once | ORAL | Status: DC | PRN

## 2019-06-17 MED ORDER — ONDANSETRON HCL 4 MG/2ML IJ SOLN
INTRAMUSCULAR | Status: DC | PRN
  Administered 2019-06-17: 4 mg via INTRAVENOUS

## 2019-06-17 MED ORDER — LAMOTRIGINE 100 MG PO TABS
150.0000 mg | ORAL_TABLET | Freq: Two times a day (BID) | ORAL | Status: DC
  Administered 2019-06-17 – 2019-06-25 (×15): 150 mg via ORAL
  Filled 2019-06-17 (×7): qty 2
  Filled 2019-06-17: qty 1
  Filled 2019-06-17 (×2): qty 2
  Filled 2019-06-17: qty 1
  Filled 2019-06-17 (×5): qty 2
  Filled 2019-06-17: qty 1

## 2019-06-17 MED ORDER — MORPHINE SULFATE (PF) 2 MG/ML IV SOLN
0.5000 mg | INTRAVENOUS | Status: DC | PRN
  Filled 2019-06-17: qty 1

## 2019-06-17 MED ORDER — ESCITALOPRAM OXALATE 5 MG PO TABS
5.0000 mg | ORAL_TABLET | Freq: Every day | ORAL | Status: DC
  Filled 2019-06-17: qty 1

## 2019-06-17 MED ORDER — METOCLOPRAMIDE HCL 5 MG PO TABS: 5.0000 mg | ORAL_TABLET | Freq: Three times a day (TID) | ORAL | Status: DC | PRN

## 2019-06-17 MED ORDER — PANTOPRAZOLE SODIUM 40 MG PO TBEC
40.0000 mg | DELAYED_RELEASE_TABLET | Freq: Every day | ORAL | Status: DC
  Administered 2019-06-17 – 2019-06-25 (×9): 40 mg via ORAL
  Filled 2019-06-17 (×9): qty 1

## 2019-06-17 MED ORDER — HYDROCODONE-ACETAMINOPHEN 5-325 MG PO TABS: 1.0000 | ORAL_TABLET | ORAL | Status: DC | PRN

## 2019-06-17 MED ORDER — ACETAMINOPHEN 325 MG PO TABS
325.0000 mg | ORAL_TABLET | Freq: Four times a day (QID) | ORAL | Status: DC | PRN
  Administered 2019-06-21: 650 mg via ORAL
  Administered 2019-06-23: 325 mg via ORAL
  Administered 2019-06-24: 650 mg via ORAL
  Filled 2019-06-17: qty 2
  Filled 2019-06-17: qty 1
  Filled 2019-06-17: qty 2

## 2019-06-17 MED ORDER — ALBUTEROL SULFATE (2.5 MG/3ML) 0.083% IN NEBU: 3.0000 mL | INHALATION_SOLUTION | RESPIRATORY_TRACT | Status: DC | PRN

## 2019-06-17 MED ORDER — FENTANYL CITRATE (PF) 250 MCG/5ML IJ SOLN
INTRAMUSCULAR | Status: DC | PRN
  Administered 2019-06-17 (×2): 25 ug via INTRAVENOUS
  Administered 2019-06-17: 50 ug via INTRAVENOUS
  Administered 2019-06-17: 25 ug via INTRAVENOUS
  Administered 2019-06-17: 75 ug via INTRAVENOUS

## 2019-06-17 MED ORDER — ALBUMIN HUMAN 5 % IV SOLN
INTRAVENOUS | Status: DC | PRN
  Administered 2019-06-17: 15:00:00 via INTRAVENOUS

## 2019-06-17 MED ORDER — CHLORHEXIDINE GLUCONATE 4 % EX LIQD: 60.0000 mL | Freq: Once | CUTANEOUS | Status: DC

## 2019-06-17 MED ORDER — SODIUM BICARBONATE 8.4 % IV SOLN
INTRAVENOUS | Status: AC
  Filled 2019-06-17: qty 50

## 2019-06-17 MED ORDER — HYDROCODONE-ACETAMINOPHEN 7.5-325 MG PO TABS
1.0000 | ORAL_TABLET | ORAL | Status: DC | PRN
  Administered 2019-06-17: 2 via ORAL
  Filled 2019-06-17 (×2): qty 2

## 2019-06-17 MED ORDER — HYDROMORPHONE HCL 1 MG/ML IJ SOLN
0.2500 mg | INTRAMUSCULAR | Status: DC | PRN
  Administered 2019-06-17: 0.5 mg via INTRAVENOUS

## 2019-06-17 MED ORDER — BUDESONIDE 0.25 MG/2ML IN SUSP
0.2500 mg | Freq: Two times a day (BID) | RESPIRATORY_TRACT | Status: DC
  Administered 2019-06-17 – 2019-06-24 (×11): 0.25 mg via RESPIRATORY_TRACT
  Filled 2019-06-17 (×15): qty 2

## 2019-06-17 MED ORDER — ONDANSETRON HCL 4 MG/2ML IJ SOLN
INTRAMUSCULAR | Status: AC
  Filled 2019-06-17: qty 2

## 2019-06-17 MED ORDER — LIDOCAINE 2% (20 MG/ML) 5 ML SYRINGE
INTRAMUSCULAR | Status: DC | PRN
  Administered 2019-06-17: 100 mg via INTRAVENOUS

## 2019-06-17 MED ORDER — RENA-VITE PO TABS
1.0000 | ORAL_TABLET | Freq: Every day | ORAL | Status: DC
  Administered 2019-06-17 – 2019-06-25 (×8): 1 via ORAL
  Filled 2019-06-17 (×10): qty 1

## 2019-06-17 MED ORDER — HYDROMORPHONE HCL 1 MG/ML IJ SOLN
INTRAMUSCULAR | Status: AC
  Filled 2019-06-17: qty 1

## 2019-06-17 MED ORDER — ESCITALOPRAM OXALATE 10 MG PO TABS
15.0000 mg | ORAL_TABLET | Freq: Every day | ORAL | Status: DC
  Administered 2019-06-17 – 2019-06-25 (×8): 15 mg via ORAL
  Filled 2019-06-17 (×9): qty 2

## 2019-06-17 MED ORDER — CALCIUM ACETATE (PHOS BINDER) 667 MG PO CAPS
1334.0000 mg | ORAL_CAPSULE | Freq: Two times a day (BID) | ORAL | Status: DC
  Administered 2019-06-17: 1334 mg via ORAL
  Filled 2019-06-17 (×3): qty 2

## 2019-06-17 MED ORDER — EPHEDRINE SULFATE 50 MG/ML IJ SOLN
INTRAMUSCULAR | Status: DC | PRN
  Administered 2019-06-17: 10 mg via INTRAVENOUS

## 2019-06-17 MED ORDER — DOCUSATE SODIUM 100 MG PO CAPS: 100.0000 mg | ORAL_CAPSULE | Freq: Two times a day (BID) | ORAL | Status: DC

## 2019-06-17 MED ORDER — PROMETHAZINE HCL 25 MG/ML IJ SOLN: 6.2500 mg | INTRAMUSCULAR | Status: DC | PRN

## 2019-06-17 MED ORDER — SODIUM CHLORIDE 0.9 % IV SOLN
INTRAVENOUS | Status: DC | PRN
  Administered 2019-06-17: 14:00:00 25 ug/min via INTRAVENOUS

## 2019-06-17 MED ORDER — METOCLOPRAMIDE HCL 5 MG/ML IJ SOLN: 5.0000 mg | Freq: Three times a day (TID) | INTRAMUSCULAR | Status: DC | PRN

## 2019-06-17 MED ORDER — SODIUM BICARBONATE 4.2 % IV SOLN
INTRAVENOUS | Status: DC | PRN
  Administered 2019-06-17: 50 meq via INTRAVENOUS

## 2019-06-17 MED ORDER — CEFAZOLIN SODIUM-DEXTROSE 2-4 GM/100ML-% IV SOLN
2.0000 g | INTRAVENOUS | Status: AC
  Administered 2019-06-17: 13:00:00 2 g via INTRAVENOUS
  Filled 2019-06-17: qty 100

## 2019-06-17 MED ORDER — DEXMEDETOMIDINE HCL IN NACL 200 MCG/50ML IV SOLN
INTRAVENOUS | Status: DC | PRN
  Administered 2019-06-17: 12 ug via INTRAVENOUS
  Administered 2019-06-17 (×2): 8 ug via INTRAVENOUS
  Administered 2019-06-17: 12 ug via INTRAVENOUS

## 2019-06-17 MED ORDER — MIDAZOLAM HCL 2 MG/2ML IJ SOLN
INTRAMUSCULAR | Status: AC
  Filled 2019-06-17: qty 2

## 2019-06-17 MED ORDER — PATIROMER SORBITEX CALCIUM 8.4 G PO PACK
8.4000 g | PACK | Freq: Every day | ORAL | Status: DC
  Filled 2019-06-17 (×2): qty 1

## 2019-06-17 MED ORDER — PHENYLEPHRINE 40 MCG/ML (10ML) SYRINGE FOR IV PUSH (FOR BLOOD PRESSURE SUPPORT)
PREFILLED_SYRINGE | INTRAVENOUS | Status: DC | PRN
  Administered 2019-06-17 (×2): 80 ug via INTRAVENOUS
  Administered 2019-06-17: 120 ug via INTRAVENOUS
  Administered 2019-06-17: 80 ug via INTRAVENOUS

## 2019-06-17 MED ORDER — CINACALCET HCL 30 MG PO TABS
120.0000 mg | ORAL_TABLET | ORAL | Status: DC
  Administered 2019-06-19 – 2019-06-25 (×3): 120 mg via ORAL
  Filled 2019-06-17 (×3): qty 4

## 2019-06-17 MED ORDER — DEXMEDETOMIDINE HCL IN NACL 200 MCG/50ML IV SOLN
INTRAVENOUS | Status: AC
  Filled 2019-06-17: qty 50

## 2019-06-17 MED ORDER — SODIUM CHLORIDE 0.9 % IV SOLN
INTRAVENOUS | Status: DC
  Administered 2019-06-17: 13:00:00 via INTRAVENOUS

## 2019-06-17 MED ORDER — ONDANSETRON HCL 4 MG/2ML IJ SOLN: 4.0000 mg | Freq: Four times a day (QID) | INTRAMUSCULAR | Status: DC | PRN

## 2019-06-17 MED ORDER — TRAZODONE HCL 50 MG PO TABS
150.0000 mg | ORAL_TABLET | Freq: Every day | ORAL | Status: DC
  Administered 2019-06-17 – 2019-06-20 (×4): 150 mg via ORAL
  Filled 2019-06-17 (×2): qty 3
  Filled 2019-06-17: qty 1
  Filled 2019-06-17: qty 3
  Filled 2019-06-17: qty 1

## 2019-06-17 MED ORDER — CINACALCET HCL 30 MG PO TABS
120.0000 mg | ORAL_TABLET | Freq: Every day | ORAL | Status: DC
  Administered 2019-06-19: 120 mg via ORAL
  Filled 2019-06-17 (×2): qty 4

## 2019-06-17 MED ORDER — GABAPENTIN 300 MG PO CAPS
300.0000 mg | ORAL_CAPSULE | Freq: Three times a day (TID) | ORAL | Status: DC
  Administered 2019-06-17 (×2): 300 mg via ORAL
  Filled 2019-06-17 (×3): qty 1

## 2019-06-17 MED ORDER — ESCITALOPRAM OXALATE 10 MG PO TABS: 10.0000 mg | ORAL_TABLET | Freq: Every day | ORAL | Status: DC

## 2019-06-17 MED ORDER — FENTANYL CITRATE (PF) 250 MCG/5ML IJ SOLN
INTRAMUSCULAR | Status: AC
  Filled 2019-06-17: qty 5

## 2019-06-17 SURGICAL SUPPLY — 69 items
ALCOHOL 70% 16 OZ (MISCELLANEOUS) ×3 IMPLANT
BANDAGE ACE 6X5 VEL STRL LF (GAUZE/BANDAGES/DRESSINGS) ×3 IMPLANT
BANDAGE ELASTIC 4 VELCRO ST LF (GAUZE/BANDAGES/DRESSINGS) ×2 IMPLANT
BANDAGE ELASTIC 6 VELCRO ST LF (GAUZE/BANDAGES/DRESSINGS) ×1 IMPLANT
BANDAGE ESMARK 6X9 LF (GAUZE/BANDAGES/DRESSINGS) ×1 IMPLANT
BIT DRILL CALIBRATED 4.3X320MM (BIT) IMPLANT
BIT DRILL CROWE POINT TWST 4.3 (DRILL) IMPLANT
BLADE 15 SAFETY STRL DISP (BLADE) ×2 IMPLANT
BNDG ELASTIC 4X5.8 VLCR STR LF (GAUZE/BANDAGES/DRESSINGS) ×3 IMPLANT
BNDG ESMARK 6X9 LF (GAUZE/BANDAGES/DRESSINGS) ×3
COVER MAYO STAND STRL (DRAPES) ×3 IMPLANT
COVER SURGICAL LIGHT HANDLE (MISCELLANEOUS) ×3 IMPLANT
COVER WAND RF STERILE (DRAPES) ×3 IMPLANT
CUFF TOURN SGL QUICK 34 (TOURNIQUET CUFF) ×2
CUFF TRNQT CYL 34X4.125X (TOURNIQUET CUFF) ×1 IMPLANT
DRAPE C-ARM 42X72 X-RAY (DRAPES) ×3 IMPLANT
DRAPE C-ARMOR (DRAPES) ×3 IMPLANT
DRAPE EXTREMITY T 121X128X90 (DISPOSABLE) ×2 IMPLANT
DRAPE HALF SHEET 40X57 (DRAPES) ×3 IMPLANT
DRAPE IMP U-DRAPE 54X76 (DRAPES) ×3 IMPLANT
DRAPE POUCH INSTRU U-SHP 10X18 (DRAPES) ×3 IMPLANT
DRAPE U-SHAPE 47X51 STRL (DRAPES) ×3 IMPLANT
DRAPE UTILITY XL STRL (DRAPES) ×6 IMPLANT
DRILL CALIBRATED 4.3X320MM (BIT) ×3
DRILL CROWE POINT TWIST 4.3 (DRILL) ×6
DRSG ADAPTIC 3X8 NADH LF (GAUZE/BANDAGES/DRESSINGS) ×2 IMPLANT
DRSG PAD ABDOMINAL 8X10 ST (GAUZE/BANDAGES/DRESSINGS) ×4 IMPLANT
DURAPREP 26ML APPLICATOR (WOUND CARE) ×5 IMPLANT
ELECT CAUTERY BLADE 6.4 (BLADE) ×3 IMPLANT
ELECT REM PT RETURN 9FT ADLT (ELECTROSURGICAL) ×3
ELECTRODE REM PT RTRN 9FT ADLT (ELECTROSURGICAL) ×1 IMPLANT
FACESHIELD WRAPAROUND (MASK) ×6 IMPLANT
FACESHIELD WRAPAROUND OR TEAM (MASK) ×2 IMPLANT
FIBER STAGRAFT 5CC (Tissue) ×2 IMPLANT
GAUZE SPONGE 4X4 12PLY STRL (GAUZE/BANDAGES/DRESSINGS) ×3 IMPLANT
GAUZE XEROFORM 1X8 LF (GAUZE/BANDAGES/DRESSINGS) ×6 IMPLANT
GLOVE BIO SURGEON STRL SZ7.5 (GLOVE) ×3 IMPLANT
GLOVE BIOGEL PI IND STRL 8 (GLOVE) ×1 IMPLANT
GLOVE BIOGEL PI INDICATOR 8 (GLOVE) ×2
GOWN STRL REUS W/ TWL LRG LVL3 (GOWN DISPOSABLE) ×2 IMPLANT
GOWN STRL REUS W/ TWL XL LVL3 (GOWN DISPOSABLE) ×1 IMPLANT
GOWN STRL REUS W/TWL LRG LVL3 (GOWN DISPOSABLE) ×4
GOWN STRL REUS W/TWL XL LVL3 (GOWN DISPOSABLE) ×2
GUIDEPIN 3.2X17.5 THRD DISP (PIN) ×4 IMPLANT
GUIDEWIRE 2.6X80 BEAD TIP (WIRE) IMPLANT
GUIDWIRE 2.6X80 BEAD TIP (WIRE) ×3
KIT BASIN OR (CUSTOM PROCEDURE TRAY) ×3 IMPLANT
MANIFOLD NEPTUNE II (INSTRUMENTS) ×3 IMPLANT
NAIL TIBIAL PHOENIX 9.0X310M (Nail) ×2 IMPLANT
NS IRRIG 1000ML POUR BTL (IV SOLUTION) ×3 IMPLANT
PACK TOTAL JOINT (CUSTOM PROCEDURE TRAY) ×3 IMPLANT
PAD CAST 4YDX4 CTTN HI CHSV (CAST SUPPLIES) ×2 IMPLANT
PADDING CAST COTTON 4X4 STRL (CAST SUPPLIES) ×2
PADDING CAST COTTON 6X4 STRL (CAST SUPPLIES) ×2 IMPLANT
SCREW CORT TI DBL LEAD 5X34 (Screw) ×2 IMPLANT
SCREW CORT TI DBL LEAD 5X38 (Screw) ×2 IMPLANT
SCREW CORT TI DBL LEAD 5X48 (Screw) ×2 IMPLANT
SCREW CORT TI DBLE LEAD 5X52 (Screw) ×2 IMPLANT
SCREW CORT TI DBLE LEAD 5X54 (Screw) ×2 IMPLANT
STAPLER SKIN PROX WIDE 3.9 (STAPLE) ×3 IMPLANT
STAPLER VISISTAT 35W (STAPLE) ×2 IMPLANT
SUCTION FRAZIER TIP 8 FR DISP (SUCTIONS) ×2
SUCTION TUBE FRAZIER 8FR DISP (SUCTIONS) IMPLANT
SUT ETHILON 3 0 FSL (SUTURE) ×2 IMPLANT
SUT MON AB 2-0 CT1 36 (SUTURE) ×7 IMPLANT
SUT VIC AB 0 CT1 27 (SUTURE) ×4
SUT VIC AB 0 CT1 27XBRD ANBCTR (SUTURE) ×1 IMPLANT
TOWEL GREEN STERILE (TOWEL DISPOSABLE) ×6 IMPLANT
WATER STERILE IRR 1000ML POUR (IV SOLUTION) ×3 IMPLANT

## 2019-06-17 NOTE — Op Note (Signed)
Date of Surgery: 06/17/2019  INDICATIONS: Kelly [name] is a 38 y.o.-year-old female who was involved in a  and sustained a right tibia fracture. The risks and benefits of the procedure discussed with the caregiver prior to the procedure and all questions were answered; consent was obtained.   She has a bit of a difficult problem.  She presented about a month ago to the ER following a fall at her group home.  She had been diagnosed with a distal tibia fracture and proximal fibular fracture.  She was lost to follow-up and did not present to the office until about a month out.  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  PREOPERATIVE DIAGNOSIS: 1. nonunion right distal tibia fracture 2.  Malunion right proximal fibula fracture  POSTOPERATIVE DIAGNOSIS: Same  PROCEDURE:   1. right tibia open reduction and intramedullary nailing   2. closed treatment of left fibular shaft fracture with manipulation, CPT - 83382  SURGEON: Geralynn Rile, M.D.  ASSISTANT: Katy Apo, RNFA.  ANESTHESIA:  general  IV FLUIDS AND URINE: See anesthesia record.  ESTIMATED BLOOD LOSS: 300 mL.  IMPLANTS:  Biomet [place] tibial nail 9 mm x 310 mm.  2 proximal 5.0 mm interlock screws in a locking mode. 3 distal 5.0 mm interlocking screws  5 cc of Biomet stay graft cancellus bone fiber at tibial nonunion   DRAINS: None.  COMPLICATIONS: None.  Tourniquet: 112 minutes at 300 mmHg  DESCRIPTION OF PROCEDURE: The patient was brought to the operating room and placed supine on the operating table.  The patient's leg had been signed prior to the Procedure, by myself in the preoperative holding area.  The patient had the anesthesia placed by the anesthesiologist.  The prep verification and incision time-outs were performed to confirm that this was the correct patient, site, side and location. The patient had an SCD on the opposite lower extremity. The  patient did receive antibiotics prior to the incision and was re-dosed during the procedure as needed at indicated intervals.    We began the procedure by manipulating in a closed fashion the distal tibia fracture.  It was noted that with close manipulation there was not much mobility through the fracture site.  Due to the delayed nature of her presentation this was consistent with her early malunion/nonunion based on no callus on x-ray.  At that juncture we elected to open the fracture with an anterior medial incision.  This was directed just over the fracture site.  Incision was carried just through the skin only.  Blunt dissection was then carried down to the level of the bone.  Due to the chronicity of this fracture there was noted to be quite a bit of fibrous nonunion encountered that was hypervascular.  These were very superficial and small caliber bleeders.  These were managed with electrocautery.  We then continued down to the level of the fracture itself.  A periosteal elevator was used to create a full-thickness flap to retract lateral.  This exposed the oblique fracture.  There was abundant fibrous nonunion noted around the anterior medial and anterior lateral fracture site.  This was taken down with rondure.  Following that we were able to directly clamp the fracture.  With the clamp in place we checked on AP and lateral fluoroscopy and the reduction was now noted to be well aligned.  We then proceeded with the intramedullary nail.  The patient had the lower  extremity prepped and draped in the standard surgical fashion.  The incision was first made over the quadriceps tendon in the midline and taken down to the skin and subcutaneous tissue to expose the peritenon. The peritenon was incised in line with the skin incision and then a poke hole was made in the quadriceps tendon in the midline.  A knife was then used to longitudinally divide the tendon in line with its fibers, taking care not to cross  over any fibers. Then the guide wire was placed, utilizing the suprapatellar approach and sleeve instrumentation, at the proximal, anterior tibia, confirming its location on both AP and lateral views. The wire was drilled into the bone and then the opening reamer was placed over this and maneuvered so that the reamer was parallel with anterior cortex of the tibia. The ball-tipped guide wire was then placed down into the canal towards the fracture site. The fracture was reduced, care was taken to confirm reduction on both AP and lateral view, and the wire was passed and confirmed to be in the proper location on both AP and lateral views.  The measuring stick was used to measure the length of the nail.  Sequential reaming was then performed, initial chatter was heard with the 8 mm opening reamer, and we sequentially reamed up to a 10.5 mm reamer to accept a 9 mm nail.  Then the nail was gently hammered into place over the guide wire and the guide wire was removed. The proximal screws were placed through the interlocking drill guide using the sleeve. The distal screws were placed using the perfect circles technique. All screws were placed in the standard fashion, first incising the skin and then spreading with a tonsil, then drilling, measuring with a depth gauge, and then placing the screws by hand.  The final x-rays were taken in both AP and lateral views to confirm the fracture reduction as well as the placement of all hardware.   The fibula fracture was treated in a closed manner.    The wounds were copiously irrigated with saline and then the peritenon of the quadriceps tendon was closed with 0 Vicryl figure-of-eight interrupted sutures. 2.0 Monocryl was used to close the subcutaneous layer.  Staples were then used to close all of the open incision wounds.  The anterior medial incision was closed with 2-0 Monocryl with a deep dermal layer and interrupted 3-0 nylon for skin.  Likewise the distal incisions were  closed with 3-0 nylon. The wounds were cleaned and dried a final time and a sterile dressing was placed.   The patient was then placed in a short leg splint in neutral ankle dorsiflexion. The patient's calf was soft to palpation at the end of the case.  There was no compartment syndrome.  The patient was then transferred to a bed and taken to the recovery room in stable condition.  All counts were correct at the end of the case.   POSTOPERATIVE PLAN: Ms. Crew will be NWB and will return 2 weeks for suture removal.  Ms. Tempesta will receive DVT prophylaxis, with 81 mg aspirin for 6 weeks once per day.  Due to her history of end-stage renal disease on hemodialysis she will be seen by the nephrology service for Monday, Wednesday, and Friday hemodialysis per her usual routine.   Victorino December, MD [phone] [facility], [place]

## 2019-06-17 NOTE — Transfer of Care (Signed)
Immediate Anesthesia Transfer of Care Note  Patient: Kelly [name]  Procedure(s) Performed: INTRAMEDULLARY (IM) NAIL TIBIAL (Right Leg Lower)  Patient Location: PACU  Anesthesia Type:General  Level of Consciousness: drowsy and patient cooperative  Airway & Oxygen Therapy: Patient Spontanous Breathing and Patient connected to nasal cannula oxygen  Post-op Assessment: Report given to RN, Post -op Vital signs reviewed and stable and Patient moving all extremities X 4  Post vital signs: Reviewed and stable  Last Vitals:  Vitals Value Taken Time  BP 103/55 06/17/19 1541  Temp    Pulse 88 06/17/19 1546  Resp 18 06/17/19 1546  SpO2 100 % 06/17/19 1546  Vitals shown include unvalidated device data.  Last Pain:  Vitals:   06/17/19 1052  TempSrc:   PainSc: 0-No pain      Patients Stated Pain Goal: 2 (37/09/64 3838)  Complications: No apparent anesthesia complications

## 2019-06-17 NOTE — Brief Op Note (Signed)
06/17/2019  3:39 PM  PATIENT:  Kelly [name]  38 y.o. female  PRE-OPERATIVE DIAGNOSIS:  Right distal tibia fracture  POST-OPERATIVE DIAGNOSIS:  Right distal tibia fracture  PROCEDURE:  Procedure(s) with comments: INTRAMEDULLARY (IM) NAIL TIBIAL (Right) - 2.5 hrs  SURGEON:  Surgeon(s) and Role:    * Stann Mainland, Elly Modena, MD - Primary  PHYSICIAN ASSISTANT:   ASSISTANTS: Katy Apo, RNFA   ANESTHESIA:   general  EBL:  300 mL   BLOOD ADMINISTERED:none  DRAINS: none   LOCAL MEDICATIONS USED:  NONE  SPECIMEN:  No Specimen  DISPOSITION OF SPECIMEN:  N/A  COUNTS:  YES  TOURNIQUET:   Total Tourniquet Time Documented: Thigh (Right) - 114 minutes Total: Thigh (Right) - 114 minutes   DICTATION: .Note written in EPIC  PLAN OF CARE: Admit to inpatient   PATIENT DISPOSITION:  PACU - hemodynamically stable.   Delay start of Pharmacological VTE agent (>24hrs) due to surgical blood loss or risk of bleeding: not applicable

## 2019-06-17 NOTE — Anesthesia Procedure Notes (Addendum)
Procedure Name: LMA Insertion Date/Time: 06/17/2019 12:55 PM Performed by: Bryson Corona, CRNA Pre-anesthesia Checklist: Patient identified, Emergency Drugs available, Suction available and Patient being monitored Patient Re-evaluated:Patient Re-evaluated prior to induction Oxygen Delivery Method: Circle System Utilized Preoxygenation: Pre-oxygenation with 100% oxygen Induction Type: IV induction LMA: LMA inserted LMA Size: 4.0 Number of attempts: 2 Placement Confirmation: positive ETCO2 Tube secured with: Tape Dental Injury: Teeth and Oropharynx as per pre-operative assessment

## 2019-06-17 NOTE — Anesthesia Preprocedure Evaluation (Signed)
Anesthesia Evaluation  Patient identified by MRN, date of birth, ID band Patient awake  General Assessment Comment:Mentally challenged, living in a group home and a care taker is with her who does not know her.  Reviewed: Allergy & Precautions, NPO status , Patient's Chart, lab work & pertinent test results  Airway Mallampati: II  TM Distance: >3 FB Neck ROM: Full    Dental no notable dental hx. (+) Teeth Intact   Pulmonary Current Smoker,    Pulmonary exam normal breath sounds clear to auscultation       Cardiovascular Exercise Tolerance: Poor hypertension, Pt. on medications and Pt. on home beta blockers Normal cardiovascular exam Rhythm:Regular Rate:Normal     Neuro/Psych Depression Bipolar Disorder    GI/Hepatic   Endo/Other  diabetes, Poorly Controlled, Type 2, Insulin Dependent  Renal/GU ESRF and DialysisRenal disease     Musculoskeletal   Abdominal (+) + obese,   Peds  Hematology  (+) anemia ,   Anesthesia Other Findings   Reproductive/Obstetrics                             Anesthesia Physical  Anesthesia Plan  ASA: IV  Anesthesia Plan: General   Post-op Pain Management:    Induction: Intravenous  PONV Risk Score and Plan: 2 and Ondansetron, Midazolam and Treatment may vary due to age or medical condition  Airway Management Planned: LMA  Additional Equipment:   Intra-op Plan:   Post-operative Plan: Extubation in OR  Informed Consent: I have reviewed the patients History and Physical, chart, labs and discussed the procedure including the risks, benefits and alternatives for the proposed anesthesia with the patient or authorized representative who has indicated his/her understanding and acceptance.       Plan Discussed with: CRNA  Anesthesia Plan Comments:         Anesthesia Quick Evaluation

## 2019-06-17 NOTE — H&P (Signed)
ORTHOPAEDIC H and P  REQUESTING PHYSICIAN: Nicholes Stairs, MD  PCP:  Lorelee Market, MD  Chief Complaint: Right tibia/fibula fracture  HPI: Kelly [name] is a 38 y.o. female who complains of right ankle pain following a fall back on June 18.  She was seen at the emergency department and placed in a splint.  She failed to follow-up with me initially due to some medical complications due to her diabetes.  She lives in a group home and has had issues with her psychiatric condition as well.  She has been intermittently weightbearing on this.  We saw her finally in the office about a month after the injury just this week.  She was noted to have a shortened and varus positioned distal tibia fracture with some early healing noted of the fibula in a malunited position.  She is a very poor historian due to psychiatric and intellectual deficits.  Past Medical History:  Diagnosis Date  . Alcohol abuse    chronic  . Anemia   . Bipolar disorder ([place])   . Chronic kidney disease    Dialysis - M/W/F  . Cognitive changes   . Cognitive impairment   . Depression   . Depression   . Diabetes mellitus without complication ([place])   . Elevated lipids   . Hyperparathyroidism ([place])   . Hypertension   . Intellectual disability   . Staph aureus infection    A/V fistula  . UTI (lower urinary tract infection)    Past Surgical History:  Procedure Laterality Date  . A/V SHUNTOGRAM Left 10/07/2018   Procedure: A/V SHUNTOGRAM;  Surgeon: Katha Cabal, MD;  Location: [place] [facility];  Service: Cardiovascular;  Laterality: Left;  . AV FISTULA PLACEMENT    . CHOLECYSTECTOMY    . dialysis perma cath Right    chest  . PERIPHERAL VASCULAR CATHETERIZATION N/A 05/24/2015   Procedure: Dialysis/Perma Catheter Removal;  Surgeon: Katha Cabal, MD;  Location: [place] [facility];  Service: Cardiovascular;  Laterality: N/A;  . PERIPHERAL VASCULAR CATHETERIZATION Left 02/28/2016   Procedure: A/V Shuntogram/Fistulagram;  Surgeon: Katha Cabal, MD;  Location: [place] [facility];  Service: Cardiovascular;  Laterality: Left;  . PERIPHERAL VASCULAR CATHETERIZATION N/A 02/28/2016   Procedure: A/V Shunt Intervention;  Surgeon: Katha Cabal, MD;  Location: [place] [facility];  Service: Cardiovascular;  Laterality: N/A;  . PERIPHERAL VASCULAR CATHETERIZATION N/A 06/26/2016   Procedure: A/V Shuntogram/Fistulagram;  Surgeon: Katha Cabal, MD;  Location: [place] [facility];  Service: Cardiovascular;  Laterality: N/A;  . PERIPHERAL VASCULAR CATHETERIZATION N/A 09/10/2016   Procedure: A/V Shuntogram/Fistulagram;  Surgeon: Algernon Huxley, MD;  Location: [place] [facility];  Service: Cardiovascular;  Laterality: N/A;  . PERIPHERAL VASCULAR CATHETERIZATION Left 09/14/2016   Procedure: Thrombectomy;  Surgeon: Katha Cabal, MD;  Location: [place] [facility];  Service: Cardiovascular;  Laterality: Left;  Marland Kitchen VASCULAR ACCESS DEVICE INSERTION Left 04/22/2015   Procedure: INSERTION OF HERO VASCULAR ACCESS DEVICE;  Surgeon: Katha Cabal, MD;  Location: [facility];  Service: Vascular;  Laterality: Left;   Social History   Socioeconomic History  . Marital status: Single    Spouse name: Not on file  . Number of children: Not on file  . Years of education: Not on file  . Highest education level: Not on file  Occupational History  . Not on file  Social Needs  . Financial resource strain: Not on file  . Food insecurity  Worry: Not on file    Inability: Not on file  . Transportation needs    Medical: Not on file    Non-medical: Not on file  Tobacco Use  . Smoking status: Current Every Day Smoker    Packs/day: 0.25    Years: 0.00    Pack years: 0.00    Types: Cigarettes  . Smokeless tobacco: Current User  Substance and Sexual Activity  . Alcohol use: No  . Drug use: No  . Sexual activity: Not on file  Lifestyle  . Physical activity    Days per week: Not on  file    Minutes per session: Not on file  . Stress: Not on file  Relationships  . Social Herbalist on phone: Not on file    Gets together: Not on file    Attends religious service: Not on file    Active member of club or organization: Not on file    Attends meetings of clubs or organizations: Not on file    Relationship status: Not on file  Other Topics Concern  . Not on file  Social History Narrative  . Not on file   Family History  Family history unknown: Yes   No Known Allergies Prior to Admission medications   Medication Sig Start Date End Date Taking? Authorizing Provider  acetaminophen (TYLENOL) 325 MG tablet Take 650 mg by mouth every Monday, Wednesday, and Friday with hemodialysis.   Yes [provider]  aspirin 81 MG chewable tablet Chew 1 tablet (81 mg total) by mouth daily. 10/02/16  Yes Clapacs, Madie Reno, MD  B Complex-C-Folic Acid (RENA-VITE RX) 1 MG TABS Take 1 tablet by mouth daily. 10/01/16  Yes Clapacs, Madie Reno, MD  calcium acetate (PHOSLO) 667 MG capsule Take 2 capsules (1,334 mg total) by mouth 2 (two) times daily with a meal. 10/01/16  Yes Clapacs, Madie Reno, MD  cetirizine (ZYRTEC) 10 MG tablet Take 10 mg by mouth daily.   Yes [provider]  chlorhexidine (PERIDEX) 0.12 % solution Use as directed 15 mLs in the mouth or throat 2 (two) times daily. Rinse for 1 min then spit 02/01/19  Yes Wieters, Hallie C, PA-C  cinacalcet (SENSIPAR) 60 MG tablet Take 1 tablet (60 mg total) by mouth daily. Patient taking differently: Take 120 mg by mouth daily.  10/01/16  Yes Clapacs, Madie Reno, MD  divalproex (DEPAKOTE) 500 MG DR tablet Take 2 tablets (1,000 mg total) by mouth at bedtime. Patient taking differently: Take 500-1,000 mg by mouth See admin instructions. 500 mg every morning, and 1000 mg at bedtime 10/01/16  Yes Clapacs, Madie Reno, MD  docusate sodium (COLACE) 100 MG capsule Take 1 capsule (100 mg total) by mouth at bedtime. 10/01/16  Yes Clapacs, Madie Reno, MD   escitalopram (LEXAPRO) 10 MG tablet Take 1.5 tablets (15 mg total) by mouth daily. Patient taking differently: Take 10 mg by mouth daily. Take with 5 mg tablet to equal 15 mg daily 10/02/16  Yes Clapacs, Madie Reno, MD  escitalopram (LEXAPRO) 5 MG tablet Take 5 mg by mouth daily. Takes along with 10mg  totally 15mg  daily.   Yes [provider]  fluticasone (FLOVENT HFA) 110 MCG/ACT inhaler Inhale 2 puffs into the lungs 2 (two) times daily.   Yes [provider]  folic acid (FOLVITE) 1 MG tablet Take 2 tablets (2 mg total) by mouth at bedtime. 10/01/16  Yes Clapacs, Madie Reno, MD  insulin aspart (NOVOLOG) 100 UNIT/ML injection  Inject 0-15 Units into the skin 3 (three) times daily with meals. Per sliding scale    Yes [provider]  insulin glargine (LANTUS) 100 UNIT/ML injection Inject 0.1 mLs (10 Units total) into the skin at bedtime. Patient taking differently: Inject 20 Units into the skin at bedtime.  06/01/19  Yes Georgette Shell, MD  lamoTRIgine (LAMICTAL) 150 MG tablet Take 150 mg by mouth 2 (two) times daily.    Yes [provider]  lidocaine-prilocaine (EMLA) cream Apply 1 application topically 3 (three) times a week. Apply to access site 30 minutes before dialysis. Patient taking differently: Apply 1 application topically every Monday, Wednesday, and Friday. Apply to access site 30 minutes before dialysis.  10/01/16  Yes Clapacs, Madie Reno, MD  medroxyPROGESTERone (DEPO-PROVERA) 150 MG/ML injection Inject 150 mg into the muscle every 3 (three) months.   Yes [provider]  Multiple Vitamin (THEREMS) TABS Take 1 tablet by mouth daily. 10/01/16  Yes Clapacs, Madie Reno, MD  oxyCODONE-acetaminophen (PERCOCET/ROXICET) 5-325 MG tablet Take 1 tablet by mouth every 6 (six) hours as needed for severe pain. 05/21/19  Yes Horton, Barbette Hair, MD  paliperidone (INVEGA SUSTENNA) 234 MG/1.5ML SUSY injection Inject 234 mg into the muscle See admin instructions. EVERY 28 DAYS     Yes [provider]  pantoprazole (PROTONIX) 40 MG tablet Take 40 mg by mouth daily.   Yes [provider]  patiromer (VELTASSA) 8.4 g packet Take 1 packet (8.4 g total) by mouth daily. Patient taking differently: Take 8.4 g by mouth daily. Mixed into 1/3 cup of drink 10/01/16  Yes Clapacs, Madie Reno, MD  Tiotropium Bromide-Olodaterol (STIOLTO RESPIMAT) 2.5-2.5 MCG/ACT AERS Inhale 2 puffs into the lungs daily.   Yes [provider]  traZODone (DESYREL) 150 MG tablet Take 150 mg by mouth at bedtime.   Yes [provider]  albuterol (PROVENTIL HFA;VENTOLIN HFA) 108 (90 Base) MCG/ACT inhaler Inhale 2 puffs into the lungs every 4 (four) hours as needed for wheezing or shortness of breath.    [provider]  glucagon (GLUCAGON EMERGENCY) 1 MG injection Inject 1 mg into the vein once as needed (low blood sugar).    [provider]   No results found.  Positive ROS: All other systems have been reviewed and were otherwise negative with the exception of those mentioned in the HPI and as above.  Physical Exam: General: Alert, no acute distress Cardiovascular: No pedal edema Respiratory: No cyanosis, no use of accessory musculature GI: No organomegaly, abdomen is soft and non-tender Skin: No lesions in the area of chief complaint Neurologic: Sensation intact distally Psychiatric: Patient is alert but not competent for consent Lymphatic: No axillary or cervical lymphadenopathy  MUSCULOSKELETAL:  Right lower extremity has a short leg splint in place.  In good repair.  Her skin is warm and well-perfused.  She wiggles toes distally.  Capillary refill less than 2 seconds.  Assessment: Right closed subacute tib-fib fracture with displacement.  Plan: -We will proceed today with intramedullary nail of the right tibia.  This is a nearly malunited but hopefully fracture that we can get reduced closed.  We may have to open and use bone graft.  We will assess  that intraoperatively.  Her legal healthcare power of attorney has provided informed consent after conversation and discussion of risks and benefits.  -She will be admitted postoperatively to the orthopedic service but will have the nephrology service follow closely for her routine hemodialysis on Monday Wednesday and  Friday.    Nicholes Stairs, MD Cell [phone]    06/17/2019 12:17 PM

## 2019-06-17 NOTE — Consult Note (Addendum)
[facility] Renal Consultation Note    Indication for Consultation:  Management of ESRD/hemodialysis; anemia, hypertension/volume and secondary hyperparathyroidism  ZOX:[id number], Meindert, MD  HPI: Kelly [name] is a 38 y.o. female. ESRD patient on HD MWF at [facility].  PMH includes HTN, DM, bipolar disorder, and intellectual disability/congnitive impairment.    Patient seen and examined at bedside in PACU.  Unable to obtain history or ROS due to patient level of consciousness while sedation wearing off.  She has been admitted for elective surgery due to fracture of both tibia and fibula of R leg s/p R tibial open reduction & intramedullary nailing by Ortho today.    Past Medical History:  Diagnosis Date  . Alcohol abuse    chronic  . Anemia   . Bipolar disorder ([place])   . Chronic kidney disease    Dialysis - M/W/F  . Cognitive changes   . Cognitive impairment   . Depression   . Depression   . Diabetes mellitus without complication ([place])   . Elevated lipids   . Hyperparathyroidism ([place])   . Hypertension   . Intellectual disability   . Staph aureus infection    A/V fistula  . UTI (lower urinary tract infection)    Past Surgical History:  Procedure Laterality Date  . A/V SHUNTOGRAM Left 10/07/2018   Procedure: A/V SHUNTOGRAM;  Surgeon: Katha Cabal, MD;  Location: [place] [facility];  Service: Cardiovascular;  Laterality: Left;  . AV FISTULA PLACEMENT    . CHOLECYSTECTOMY    . dialysis perma cath Right    chest  . PERIPHERAL VASCULAR CATHETERIZATION N/A 05/24/2015   Procedure: Dialysis/Perma Catheter Removal;  Surgeon: Katha Cabal, MD;  Location: [place] [facility];  Service: Cardiovascular;  Laterality: N/A;  . PERIPHERAL VASCULAR CATHETERIZATION Left 02/28/2016   Procedure: A/V Shuntogram/Fistulagram;  Surgeon: Katha Cabal, MD;  Location: [place] [facility];  Service: Cardiovascular;  Laterality: Left;  . PERIPHERAL VASCULAR  CATHETERIZATION N/A 02/28/2016   Procedure: A/V Shunt Intervention;  Surgeon: Katha Cabal, MD;  Location: [place] [facility];  Service: Cardiovascular;  Laterality: N/A;  . PERIPHERAL VASCULAR CATHETERIZATION N/A 06/26/2016   Procedure: A/V Shuntogram/Fistulagram;  Surgeon: Katha Cabal, MD;  Location: [place] [facility];  Service: Cardiovascular;  Laterality: N/A;  . PERIPHERAL VASCULAR CATHETERIZATION N/A 09/10/2016   Procedure: A/V Shuntogram/Fistulagram;  Surgeon: Algernon Huxley, MD;  Location: [place] [facility];  Service: Cardiovascular;  Laterality: N/A;  . PERIPHERAL VASCULAR CATHETERIZATION Left 09/14/2016   Procedure: Thrombectomy;  Surgeon: Katha Cabal, MD;  Location: [place] [facility];  Service: Cardiovascular;  Laterality: Left;  Marland Kitchen VASCULAR ACCESS DEVICE INSERTION Left 04/22/2015   Procedure: INSERTION OF HERO VASCULAR ACCESS DEVICE;  Surgeon: Katha Cabal, MD;  Location: [facility];  Service: Vascular;  Laterality: Left;   Family History  Family history unknown: Yes   Social History:  reports that she has been smoking cigarettes. She has been smoking about 0.25 packs per day for the past 0.00 years. She uses smokeless tobacco. She reports that she does not drink alcohol or use drugs. No Known Allergies Prior to Admission medications   Medication Sig Start Date End Date Taking? Authorizing Provider  acetaminophen (TYLENOL) 325 MG tablet Take 650 mg by mouth every Monday, Wednesday, and Friday with hemodialysis.   Yes [provider]  aspirin 81 MG chewable tablet Chew 1 tablet (81 mg total) by mouth daily. 10/02/16  Yes Clapacs, Jenny Reichmann  T, MD  B Complex-C-Folic Acid (RENA-VITE RX) 1 MG TABS Take 1 tablet by mouth daily. 10/01/16  Yes Clapacs, Madie Reno, MD  calcium acetate (PHOSLO) 667 MG capsule Take 2 capsules (1,334 mg total) by mouth 2 (two) times daily with a meal. 10/01/16  Yes Clapacs, Madie Reno, MD  cetirizine (ZYRTEC) 10 MG tablet Take 10 mg by mouth  daily.   Yes [provider]  chlorhexidine (PERIDEX) 0.12 % solution Use as directed 15 mLs in the mouth or throat 2 (two) times daily. Rinse for 1 min then spit 02/01/19  Yes Wieters, Hallie C, PA-C  cinacalcet (SENSIPAR) 60 MG tablet Take 1 tablet (60 mg total) by mouth daily. Patient taking differently: Take 120 mg by mouth daily.  10/01/16  Yes Clapacs, Madie Reno, MD  divalproex (DEPAKOTE) 500 MG DR tablet Take 2 tablets (1,000 mg total) by mouth at bedtime. Patient taking differently: Take 500-1,000 mg by mouth See admin instructions. 500 mg every morning, and 1000 mg at bedtime 10/01/16  Yes Clapacs, Madie Reno, MD  docusate sodium (COLACE) 100 MG capsule Take 1 capsule (100 mg total) by mouth at bedtime. 10/01/16  Yes Clapacs, Madie Reno, MD  escitalopram (LEXAPRO) 10 MG tablet Take 1.5 tablets (15 mg total) by mouth daily. Patient taking differently: Take 10 mg by mouth daily. Take with 5 mg tablet to equal 15 mg daily 10/02/16  Yes Clapacs, Madie Reno, MD  escitalopram (LEXAPRO) 5 MG tablet Take 5 mg by mouth daily. Takes along with 10mg  totally 15mg  daily.   Yes [provider]  fluticasone (FLOVENT HFA) 110 MCG/ACT inhaler Inhale 2 puffs into the lungs 2 (two) times daily.   Yes [provider]  folic acid (FOLVITE) 1 MG tablet Take 2 tablets (2 mg total) by mouth at bedtime. 10/01/16  Yes Clapacs, Madie Reno, MD  insulin aspart (NOVOLOG) 100 UNIT/ML injection Inject 0-15 Units into the skin 3 (three) times daily with meals. Per sliding scale    Yes [provider]  insulin glargine (LANTUS) 100 UNIT/ML injection Inject 0.1 mLs (10 Units total) into the skin at bedtime. Patient taking differently: Inject 20 Units into the skin at bedtime.  06/01/19  Yes Georgette Shell, MD  lamoTRIgine (LAMICTAL) 150 MG tablet Take 150 mg by mouth 2 (two) times daily.    Yes [provider]  lidocaine-prilocaine (EMLA) cream Apply 1 application topically 3 (three) times a week. Apply  to access site 30 minutes before dialysis. Patient taking differently: Apply 1 application topically every Monday, Wednesday, and Friday. Apply to access site 30 minutes before dialysis.  10/01/16  Yes Clapacs, Madie Reno, MD  medroxyPROGESTERone (DEPO-PROVERA) 150 MG/ML injection Inject 150 mg into the muscle every 3 (three) months.   Yes [provider]  Multiple Vitamin (THEREMS) TABS Take 1 tablet by mouth daily. 10/01/16  Yes Clapacs, Madie Reno, MD  oxyCODONE-acetaminophen (PERCOCET/ROXICET) 5-325 MG tablet Take 1 tablet by mouth every 6 (six) hours as needed for severe pain. 05/21/19  Yes Horton, Barbette Hair, MD  paliperidone (INVEGA SUSTENNA) 234 MG/1.5ML SUSY injection Inject 234 mg into the muscle See admin instructions. EVERY 28 DAYS    Yes [provider]  pantoprazole (PROTONIX) 40 MG tablet Take 40 mg by mouth daily.   Yes [provider]  patiromer (VELTASSA) 8.4 g packet Take 1 packet (8.4 g total) by mouth daily. Patient taking differently: Take 8.4 g by mouth daily. Mixed into 1/3 cup of drink 10/01/16  Yes Clapacs, Madie Reno, MD  Tiotropium Bromide-Olodaterol (STIOLTO RESPIMAT) 2.5-2.5 MCG/ACT AERS Inhale 2 puffs into the lungs daily.   Yes [provider]  traZODone (DESYREL) 150 MG tablet Take 150 mg by mouth at bedtime.   Yes [provider]  albuterol (PROVENTIL HFA;VENTOLIN HFA) 108 (90 Base) MCG/ACT inhaler Inhale 2 puffs into the lungs every 4 (four) hours as needed for wheezing or shortness of breath.    [provider]  glucagon (GLUCAGON EMERGENCY) 1 MG injection Inject 1 mg into the vein once as needed (low blood sugar).    [provider]   Current Facility-Administered Medications  Medication Dose Route Frequency Provider Last Rate Last Dose  . 0.9 %  sodium chloride infusion   Intravenous Continuous Lynda Rainwater, MD      . chlorhexidine (HIBICLENS) 4 % liquid 4 application  60 mL Topical Once Nicholes Stairs,  MD      . HYDROmorphone (DILAUDID) injection 0.25-0.5 mg  0.25-0.5 mg Intravenous Q5 min PRN Lynda Rainwater, MD      . oxyCODONE (Oxy IR/ROXICODONE) immediate release tablet 5 mg  5 mg Oral Once PRN Lynda Rainwater, MD       Or  . oxyCODONE (ROXICODONE) 5 MG/5ML solution 5 mg  5 mg Oral Once PRN Lynda Rainwater, MD      . promethazine (PHENERGAN) injection 6.25-12.5 mg  6.25-12.5 mg Intravenous Q15 min PRN Lynda Rainwater, MD       Labs: Basic Metabolic Panel: Recent Labs  Lab 06/17/19 1058  NA 140  K 3.2*  CL 98  CO2 29  GLUCOSE 110*  BUN 36*  CREATININE 9.08*  CALCIUM 10.4*   CBC: Recent Labs  Lab 06/17/19 1058  WBC 5.7  HGB 8.7*  HCT 28.5*  MCV 102.9*  PLT 224  CBG: Recent Labs  Lab 06/17/19 1042 06/17/19 1545  GLUCAP 93 137*    ROS: unable to obtain due to current mental status   Physical Exam: Vitals:   06/17/19 1540 06/17/19 1545 06/17/19 1555 06/17/19 1610  BP: (!) 103/55  126/60 126/63  Pulse: 91 89 90 96  Resp: (!) 22 17 16 18   Temp: (!) 97.3 F (36.3 C)     TempSrc:      SpO2: 91% 100% 100% 98%  Weight:         General: WDWN, NAD, obese female, laying bed Head: NCAT sclera not icteric MMM Neck: Supple. No lymphadenopathy Lungs: CTA bilaterally. No wheeze, rales or rhonchi. Breathing is unlabored. Heart: RRR. No murmur, rubs or gallops.  Abdomen: soft, nontender, +BS, no guarding, no rebound tenderness Lower extremities:no edema or ischemic changes, RLE wrapped from toes to thigh Neuro: drowsy, recovering from anesthesia, Oriented to place and person Dialysis Access: LU AVG +b  Dialysis Orders: Based off orders from recent admission in Early July, will contact OP unit for updated orders  MWF- [facility]  3.25hrs, BFR 450, DFR 600,  EDW ?, 2K/ 2.5Ca  Access: LU AVG  Heparin 2000 bolus and 200 unit qhr sensipar 120mg  qHD Midodrine 10mg  qHD [place] daily phoslo 2 tabs bid   Assessment/Plan: 1.  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  Per ortho 2.  ESRD -  On HD MWF at [facility].  Plan for HD off schedule tomorrow d/t surgery. K 3.2. 3.  Hypertension/volume  - BP currently well  controlled.  Appears euvolemic on exam.  Minimal UF with HD tomorrow. 4.  Anemia of CKD - Hgb 8.7. Getting records from OP unit for ESA dosing. 5.  Secondary Hyperparathyroidism -  Ca elevated. Will check phos. Use low Ca bath, continue sensipar. Hold binders for now d/t elevated Ca. 6.  Nutrition - Renal diet w/fluid restrictions 7. Bipolar d/o - pt lives in a group home, on multiple meds 8. DM - per primary  Jen Mow, PA-C [facility] Pager: [phone] 06/17/2019, 4:21 PM   Pt seen, examined and agree w A/P as above.  Kelly Splinter  MD 06/17/2019, 4:44 PM

## 2019-06-18 ENCOUNTER — Encounter (HOSPITAL_COMMUNITY): Payer: Self-pay | Admitting: Orthopedic Surgery

## 2019-06-18 DIAGNOSIS — F70 Mild intellectual disabilities: Secondary | ICD-10-CM

## 2019-06-18 DIAGNOSIS — E1122 Type 2 diabetes mellitus with diabetic chronic kidney disease: Secondary | ICD-10-CM

## 2019-06-18 DIAGNOSIS — D649 Anemia, unspecified: Secondary | ICD-10-CM

## 2019-06-18 DIAGNOSIS — S82301K Unspecified fracture of lower end of right tibia, subsequent encounter for closed fracture with nonunion: Secondary | ICD-10-CM

## 2019-06-18 DIAGNOSIS — I10 Essential (primary) hypertension: Secondary | ICD-10-CM

## 2019-06-18 DIAGNOSIS — Z794 Long term (current) use of insulin: Secondary | ICD-10-CM

## 2019-06-18 DIAGNOSIS — N186 End stage renal disease: Secondary | ICD-10-CM

## 2019-06-18 DIAGNOSIS — Z992 Dependence on renal dialysis: Secondary | ICD-10-CM

## 2019-06-18 LAB — GLUCOSE, CAPILLARY
Glucose-Capillary: 124 mg/dL — ABNORMAL HIGH (ref 70–99)
Glucose-Capillary: 119 mg/dL — ABNORMAL HIGH (ref 70–99)
Glucose-Capillary: 140 mg/dL — ABNORMAL HIGH (ref 70–99)

## 2019-06-18 LAB — CBC
HCT: 19.2 % — ABNORMAL LOW (ref 36.0–46.0)
Hemoglobin: 6.1 g/dL — CL (ref 12.0–15.0)
MCH: 31.9 pg (ref 26.0–34.0)
MCHC: 31.8 g/dL (ref 30.0–36.0)
MCV: 100.5 fL — ABNORMAL HIGH (ref 80.0–100.0)
Platelets: 169 10*3/uL (ref 150–400)
RBC: 1.91 MIL/uL — ABNORMAL LOW (ref 3.87–5.11)
RDW: 15.5 % (ref 11.5–15.5)
WBC: 7.3 10*3/uL (ref 4.0–10.5)
nRBC: 0 % (ref 0.0–0.2)

## 2019-06-18 LAB — RENAL FUNCTION PANEL
Albumin: 2.7 g/dL — ABNORMAL LOW (ref 3.5–5.0)
Anion gap: 18 — ABNORMAL HIGH (ref 5–15)
BUN: 45 mg/dL — ABNORMAL HIGH (ref 6–20)
CO2: 24 mmol/L (ref 22–32)
Calcium: 9.9 mg/dL (ref 8.9–10.3)
Chloride: 95 mmol/L — ABNORMAL LOW (ref 98–111)
Creatinine, Ser: 11.55 mg/dL — ABNORMAL HIGH (ref 0.44–1.00)
GFR calc Af Amer: 4 mL/min — ABNORMAL LOW (ref >60)
GFR calc non Af Amer: 4 mL/min — ABNORMAL LOW (ref >60)
Glucose, Bld: 173 mg/dL — ABNORMAL HIGH (ref 70–99)
Phosphorus: 4.1 mg/dL (ref 2.5–4.6)
Potassium: 3.6 mmol/L (ref 3.5–5.1)
Sodium: 137 mmol/L (ref 135–145)

## 2019-06-18 LAB — MRSA PCR SCREENING: MRSA by PCR: NEGATIVE

## 2019-06-18 LAB — PREPARE RBC (CROSSMATCH)

## 2019-06-18 MED ORDER — HEPARIN SODIUM (PORCINE) 1000 UNIT/ML DIALYSIS: 1000.0000 [IU] | INTRAMUSCULAR | Status: DC | PRN

## 2019-06-18 MED ORDER — NALOXONE HCL 0.4 MG/ML IJ SOLN
INTRAMUSCULAR | Status: AC
  Filled 2019-06-18: qty 1

## 2019-06-18 MED ORDER — LIDOCAINE HCL (PF) 1 % IJ SOLN: 5.0000 mL | INTRAMUSCULAR | Status: DC | PRN

## 2019-06-18 MED ORDER — SODIUM CHLORIDE 0.9 % IV SOLN: 100.0000 mL | INTRAVENOUS | Status: DC | PRN

## 2019-06-18 MED ORDER — INSULIN GLARGINE 100 UNIT/ML ~~LOC~~ SOLN
10.0000 [IU] | Freq: Every day | SUBCUTANEOUS | Status: DC
  Filled 2019-06-18 (×2): qty 0.1

## 2019-06-18 MED ORDER — PENTAFLUOROPROP-TETRAFLUOROETH EX AERO: 1.0000 "application " | INHALATION_SPRAY | CUTANEOUS | Status: DC | PRN

## 2019-06-18 MED ORDER — LIDOCAINE-PRILOCAINE 2.5-2.5 % EX CREA: 1.0000 "application " | TOPICAL_CREAM | CUTANEOUS | Status: DC | PRN

## 2019-06-18 MED ORDER — SODIUM CHLORIDE 0.9% IV SOLUTION: Freq: Once | INTRAVENOUS | Status: DC

## 2019-06-18 MED ORDER — INSULIN ASPART 100 UNIT/ML ~~LOC~~ SOLN
0.0000 [IU] | Freq: Three times a day (TID) | SUBCUTANEOUS | Status: DC
  Administered 2019-06-19: 1 [IU] via SUBCUTANEOUS
  Administered 2019-06-19: 2 [IU] via SUBCUTANEOUS
  Administered 2019-06-20 (×3): 1 [IU] via SUBCUTANEOUS
  Administered 2019-06-21: 3 [IU] via SUBCUTANEOUS
  Administered 2019-06-21 – 2019-06-22 (×2): 1 [IU] via SUBCUTANEOUS
  Administered 2019-06-22 – 2019-06-24 (×4): 2 [IU] via SUBCUTANEOUS
  Administered 2019-06-24: 1 [IU] via SUBCUTANEOUS
  Administered 2019-06-24: 5 [IU] via SUBCUTANEOUS
  Administered 2019-06-25: 2 [IU] via SUBCUTANEOUS
  Administered 2019-06-25: 1 [IU] via SUBCUTANEOUS

## 2019-06-18 MED ORDER — NALOXONE HCL 0.4 MG/ML IJ SOLN
0.4000 mg | Freq: Once | INTRAMUSCULAR | Status: AC
  Administered 2019-06-18: 0.4 mg via INTRAMUSCULAR

## 2019-06-18 MED ORDER — ALTEPLASE 2 MG IJ SOLR: 2.0000 mg | Freq: Once | INTRAMUSCULAR | Status: DC | PRN

## 2019-06-18 MED ORDER — CALCIUM ACETATE (PHOS BINDER) 667 MG PO CAPS
1334.0000 mg | ORAL_CAPSULE | Freq: Two times a day (BID) | ORAL | Status: DC
  Administered 2019-06-19: 1334 mg via ORAL
  Filled 2019-06-18 (×2): qty 2

## 2019-06-18 NOTE — Progress Notes (Signed)
   Subjective:  Patient reports pain as moderate.  Very sleepy this am, arousable to voice, but not speaking, which is close to her baseline.  Objective:   VITALS:   Vitals:   06/17/19 2137 06/18/19 0029 06/18/19 0330 06/18/19 0904  BP: (!) 160/69 (!) 150/56 102/76 (!) 124/56  Pulse: 99 (!) 106 89 (!) 117  Resp: 12 14 14    Temp: 98.1 F (36.7 C) 98.5 F (36.9 C) 98.2 F (36.8 C)   TempSrc: Oral Oral Oral   SpO2: 98% 100% 100% 94%  Weight:        Incision: dressing C/D/I No cellulitis present Compartment soft cap refill < 2 s   Lab Results  Component Value Date   WBC 5.7 06/17/2019   HGB 8.7 (L) 06/17/2019   HCT 28.5 (L) 06/17/2019   MCV 102.9 (H) 06/17/2019   PLT 224 06/17/2019   BMET    Component Value Date/Time   NA 140 06/17/2019 1058   NA 135 (L) 12/24/2014 0954   K 3.2 (L) 06/17/2019 1058   K 6.3 (H) 12/24/2014 0954   CL 98 06/17/2019 1058   CL 100 12/24/2014 0954   CO2 29 06/17/2019 1058   CO2 28 12/24/2014 0954   GLUCOSE 110 (H) 06/17/2019 1058   GLUCOSE 138 (H) 12/24/2014 0954   BUN 36 (H) 06/17/2019 1058   BUN 75 (H) 12/24/2014 0954   CREATININE 9.08 (H) 06/17/2019 1058   CREATININE 8.81 (H) 12/24/2014 0954   CALCIUM 10.4 (H) 06/17/2019 1058   CALCIUM 9.6 05/27/2019 1445   GFRNONAA 5 (L) 06/17/2019 1058   GFRNONAA 6 (L) 12/24/2014 0954   GFRNONAA 3 (L) 08/02/2014 1150   GFRAA 6 (L) 06/17/2019 1058   GFRAA 7 (L) 12/24/2014 0954   GFRAA 4 (L) 08/02/2014 1150     Assessment/Plan: 1 Day Post-Op   Active Problems:   Closed fracture of right distal tibia   - NWB and maintain dressings/splint to RLE - up with therapy if able  - appreciate Renal's help with HD while admitted and to monitoring for renal dosing of meds.  Planned for Dialysis today  - will monitor her mental status after HD, but she has profound intellectual deficits at her baseline.  Nicholes Stairs 06/18/2019, 10:10 AM   Geralynn Rile, MD [phone]

## 2019-06-18 NOTE — Anesthesia Postprocedure Evaluation (Signed)
Anesthesia Post Note  Patient: Kelly [name]  Procedure(s) Performed: INTRAMEDULLARY (IM) NAIL TIBIAL (Right Leg Lower)     Patient location during evaluation: PACU Anesthesia Type: General Level of consciousness: awake and alert Pain management: pain level controlled Vital Signs Assessment: post-procedure vital signs reviewed and stable Respiratory status: spontaneous breathing, nonlabored ventilation, respiratory function stable and patient connected to nasal cannula oxygen Cardiovascular status: blood pressure returned to baseline and stable Postop Assessment: no apparent nausea or vomiting Anesthetic complications: no    Last Vitals:  Vitals:   06/18/19 1015 06/18/19 1650  BP:  (!) 149/76  Pulse: (!) 119 (!) 109  Resp:  16  Temp:    SpO2: 98% 98%    Last Pain:  Vitals:   06/18/19 1015  TempSrc:   PainSc: [place]

## 2019-06-18 NOTE — Consult Note (Signed)
Medical Consultation   Kelly [name]  GNF:[id number]  DOB: Jul 04, 1981  DOA: 06/17/2019  PCP: Kelly Market, MD   Requesting physician: Kelly Alberts PA-C  Reason for consultation: Anemia post-op   History of Present Illness: Kelly [name] is an 38 y.o. female h/o ESRD on MWF dialysis, DM, HTN, BPD, intellectual disability lives in group home.  Patient was admitted for surgical repair of R tib/fib fx on 7/22.  Fx occurred due to mechanical fall Jun 18th it appears.  Patient underwent ORIF on 7/22 with EBL of 300cc intraop.  HGB pre-op was 8.7 (looks like her baseline is in the 8 range).  This evening labs drawn pre-dialysis showed HGB 6.1.  Other than being potentially oversedated earlier in the day today and unable to wake up for Dr. Jonnie Finner (this is resolved by the time I am consulting on her this evening in dialysis), no other abnormal events thus far during stay.  Specifically there arnt any reports of blood loss on the unit, hematochezia, hematemesis, etc.  She is now AAO and asking when dialysis is going to be done.  Denies abd pain, CP, SOB, leg pain.  Does have mild headache.   Review of Systems:  ROS As per HPI otherwise all systems reviewed and are negative.    Past Medical History: Past Medical History:  Diagnosis Date   Alcohol abuse    chronic   Anemia    Bipolar disorder ([place])    Chronic kidney disease    Dialysis - M/W/F   Cognitive changes    Cognitive impairment    Depression    Depression    Diabetes mellitus without complication (HCC)    Elevated lipids    Hyperparathyroidism ([place])    Hypertension    Intellectual disability    Staph aureus infection    A/V fistula   UTI (lower urinary tract infection)     Past Surgical History: Past Surgical History:  Procedure Laterality Date   A/V SHUNTOGRAM Left 10/07/2018   Procedure: A/V SHUNTOGRAM;  Surgeon: Katha Cabal, MD;  Location: [facility];  Service: Cardiovascular;  Laterality: Left;   AV FISTULA PLACEMENT     CHOLECYSTECTOMY     dialysis perma cath Right    chest   PERIPHERAL VASCULAR CATHETERIZATION N/A 05/24/2015   Procedure: Dialysis/Perma Catheter Removal;  Surgeon: Katha Cabal, MD;  Location: [facility];  Service: Cardiovascular;  Laterality: N/A;   PERIPHERAL VASCULAR CATHETERIZATION Left 02/28/2016   Procedure: A/V Shuntogram/Fistulagram;  Surgeon: Katha Cabal, MD;  Location: [facility];  Service: Cardiovascular;  Laterality: Left;   PERIPHERAL VASCULAR CATHETERIZATION N/A 02/28/2016   Procedure: A/V Shunt Intervention;  Surgeon: Katha Cabal, MD;  Location: [facility];  Service: Cardiovascular;  Laterality: N/A;   PERIPHERAL VASCULAR CATHETERIZATION N/A 06/26/2016   Procedure: A/V Shuntogram/Fistulagram;  Surgeon: Katha Cabal, MD;  Location: [facility];  Service: Cardiovascular;  Laterality: N/A;   PERIPHERAL VASCULAR CATHETERIZATION N/A 09/10/2016   Procedure: A/V Shuntogram/Fistulagram;  Surgeon: Algernon Huxley, MD;  Location: [facility];  Service: Cardiovascular;  Laterality: N/A;   PERIPHERAL VASCULAR CATHETERIZATION Left 09/14/2016   Procedure: Thrombectomy;  Surgeon: Katha Cabal, MD;  Location: [facility];  Service: Cardiovascular;  Laterality: Left;   TIBIA IM NAIL INSERTION Right 06/17/2019   Procedure: INTRAMEDULLARY (IM) NAIL TIBIAL;  Surgeon: Nicholes Stairs, MD;  Location: [facility];  Service: Orthopedics;  Laterality: Right;  2.5 hrs   VASCULAR ACCESS DEVICE INSERTION Left 04/22/2015   Procedure: INSERTION OF HERO VASCULAR ACCESS DEVICE;  Surgeon: Katha Cabal, MD;  Location: [facility];  Service: Vascular;  Laterality: Left;     Allergies:  No Known Allergies   Social History:  reports that she has been smoking cigarettes. She has been smoking about 0.25 packs per day for the past 0.00 years. She uses  smokeless tobacco. She reports that she does not drink alcohol or use drugs.   Family History: Family History  Family history unknown: Yes     Physical Exam: Vitals:   06/18/19 0330 06/18/19 0904 06/18/19 1015 06/18/19 1650  BP: 102/76 (!) 124/56  (!) 149/76  Pulse: 89 (!) 117 (!) 119 (!) 109  Resp: 14   16  Temp: 98.2 F (36.8 C)     TempSrc: Oral     SpO2: 100% 94% 98% 98%  Weight:        Constitutional: Alert and awake, oriented x3, not in any acute distress. Eyes: PERLA, EOMI, irises appear normal, anicteric sclera,  ENMT: external ears and nose appear normal            Lips appears normal, oropharynx mucosa, tongue, posterior pharynx appear normal  Neck: neck appears normal, no masses, normal ROM, no thyromegaly, no JVD  CVS: S1-S2 clear, no murmur rubs or gallops, no LE edema, normal pedal pulses  Respiratory:  clear to auscultation bilaterally, no wheezing, rales or rhonchi. Respiratory effort normal. No accessory muscle use.  Abdomen: soft nontender, nondistended, normal bowel sounds, no hepatosplenomegaly, no hernias  Musculoskeletal: : no cyanosis, clubbing or edema noted bilaterally Neuro: Cranial nerves II-XII intact, strength, sensation, reflexes Psych: judgement and insight appear normal, stable mood and affect, mental status Skin: no rashes or lesions or ulcers, no induration or nodules    Data reviewed:  I have personally reviewed following labs and imaging studies Labs:  CBC: Recent Labs  Lab 06/17/19 1058 06/18/19 2015  WBC 5.7 7.3  HGB 8.7* 6.1*  HCT 28.5* 19.2*  MCV 102.9* 100.5*  PLT 224 563    Basic Metabolic Panel: Recent Labs  Lab 06/17/19 1058 06/18/19 2015  NA 140 137  K 3.2* 3.6  CL 98 95*  CO2 29 24  GLUCOSE 110* 173*  BUN 36* 45*  CREATININE 9.08* 11.55*  CALCIUM 10.4* 9.9  PHOS  --  4.1   GFR Estimated Creatinine Clearance: 6.7 mL/min (A) (by C-G formula based on SCr of 11.55 mg/dL (H)). Liver Function Tests: Recent  Labs  Lab 06/18/19 2015  ALBUMIN 2.7*   No results for input(s): LIPASE, AMYLASE in the last 168 hours. No results for input(s): AMMONIA in the last 168 hours. Coagulation profile No results for input(s): INR, PROTIME in the last 168 hours.  Cardiac Enzymes: No results for input(s): CKTOTAL, CKMB, CKMBINDEX, TROPONINI in the last 168 hours. BNP: Invalid input(s): POCBNP CBG: Recent Labs  Lab 06/17/19 1716 06/17/19 2133 06/18/19 0923 06/18/19 1148 06/18/19 1648  GLUCAP 123* 167* 124* 119* 140*   D-Dimer No results for input(s): DDIMER in the last 72 hours. Hgb A1c No results for input(s): HGBA1C in the last 72 hours. Lipid Profile No results for input(s): CHOL, HDL, LDLCALC, TRIG, CHOLHDL, LDLDIRECT in the last 72 hours. Thyroid function studies No results for input(s): TSH, T4TOTAL, T3FREE, THYROIDAB in the last 72 hours.  Invalid input(s):  FREET3 Anemia work up No results for input(s): VITAMINB12, FOLATE, FERRITIN, TIBC, IRON, RETICCTPCT in the last 72 hours. Urinalysis    Component Value Date/Time   COLORURINE YELLOW (A) 01/18/2016 1332   APPEARANCEUR HAZY (A) 01/18/2016 1332   APPEARANCEUR HAZY 06/03/2014 2028   LABSPEC 1.008 01/18/2016 1332   LABSPEC 1.005 06/03/2014 2028   PHURINE 9.0 (H) 01/18/2016 1332   GLUCOSEU >500 (A) 01/18/2016 1332   GLUCOSEU 500 mg/dL 06/03/2014 2028   HGBUR NEGATIVE 01/18/2016 1332   BILIRUBINUR NEGATIVE 01/18/2016 1332   BILIRUBINUR NEGATIVE 06/03/2014 2028   KETONESUR NEGATIVE 01/18/2016 1332   PROTEINUR 100 (A) 01/18/2016 1332   NITRITE NEGATIVE 01/18/2016 1332   LEUKOCYTESUR TRACE (A) 01/18/2016 1332   LEUKOCYTESUR NEGATIVE 06/03/2014 2028     Microbiology Recent Results (from the past 240 hour(s))  SARS Coronavirus 2 (Performed in [facility] hospital lab)     Status: None   Collection Time: 06/16/19 11:05 AM   Specimen: Nasal Swab  Result Value Ref Range Status   SARS Coronavirus 2 NEGATIVE NEGATIVE Final     Comment: (NOTE) SARS-CoV-2 target nucleic acids are NOT DETECTED. The SARS-CoV-2 RNA is generally detectable in upper and lower respiratory specimens during the acute phase of infection. Negative results do not preclude SARS-CoV-2 infection, do not rule out co-infections with other pathogens, and should not be used as the sole basis for treatment or other patient management decisions. Negative results must be combined with clinical observations, patient history, and epidemiological information. The expected result is Negative. Fact Sheet for Patients: SugarRoll.be Fact Sheet for Healthcare Providers: https://www.woods-mathews.com/ This test is not yet approved or cleared by the [place] FDA and  has been authorized for detection and/or diagnosis of SARS-CoV-2 by FDA under an Emergency Use Authorization (EUA). This EUA will remain  in effect (meaning this test can be used) for the duration of the COVID-19 declaration under Section 56 4(b)(1) of the Act, 21 U.S.C. section 360bbb-3(b)(1), unless the authorization is terminated or revoked sooner. Performed at [facility], [place] [address] [place], [place] [address]   MRSA PCR Screening     Status: None   Collection Time: 06/18/19  5:07 PM   Specimen: Nasal Mucosa; Nasopharyngeal  Result Value Ref Range Status   MRSA by PCR NEGATIVE NEGATIVE Final    Comment:        The GeneXpert MRSA Assay (FDA approved for NASAL specimens only), is one component of a comprehensive MRSA colonization surveillance program. It is not intended to diagnose MRSA infection nor to guide or monitor treatment for MRSA infections. Performed at [facility], [place] [address], [place], [place] [address]        Inpatient Medications:   Scheduled Meds:  sodium chloride   Intravenous Once   aspirin  81 mg Oral Daily   budesonide  0.25 mg Nebulization BID   calcium acetate  1,334 mg Oral BID WC    Chlorhexidine Gluconate Cloth  6 each Topical Q0600   cinacalcet  120 mg Oral Q breakfast   [START ON 06/19/2019] cinacalcet  120 mg Oral Q M,W,F-HD   divalproex  1,000 mg Oral QPM   divalproex  500 mg Oral q morning - 10a   docusate sodium  100 mg Oral QHS   escitalopram  15 mg Oral Daily   folic acid  2 mg Oral QHS   [START ON 06/19/2019] insulin aspart  0-9 Units Subcutaneous TID WC   insulin glargine  10  Units Subcutaneous QHS   lamoTRIgine  150 mg Oral BID   multivitamin  1 tablet Oral Daily   pantoprazole  40 mg Oral Daily   patiromer  8.4 g Oral Daily   traZODone  150 mg Oral QHS   Continuous Infusions:  sodium chloride     sodium chloride      ceFAZolin (ANCEF) IV 1 g (06/18/19 1712)     Radiological Exams on Admission: Dg Tibia/fibula Right  Result Date: 06/17/2019 CLINICAL DATA:  RIGHT tibial nailing EXAM: DG C-ARM 61-120 MIN; RIGHT TIBIA AND FIBULA - 2 VIEW COMPARISON:  05/20/2019 FLUOROSCOPY TIME:  2 minutes 33 seconds Images submitted: 6 FINDINGS: Images demonstrate presence of an IM nail with proximal and distal locking screws identified within the tibia across a mildly displaced oblique fracture of the distal RIGHT tibial metadiaphysis. Knee and ankle joint alignments normal. Comminuted fracture of the proximal fibular diaphysis is also seen. Bones appear demineralized. IMPRESSION: Post nailing of distal RIGHT tibial fracture. Comminuted displaced proximal RIGHT fibular diaphyseal fracture. Electronically Signed   By: [name] M.D.   On: 06/17/2019 16:33   Dg C-arm 1-60 Min  Result Date: 06/17/2019 CLINICAL DATA:  RIGHT tibial nailing EXAM: DG C-ARM 61-120 MIN; RIGHT TIBIA AND FIBULA - 2 VIEW COMPARISON:  05/20/2019 FLUOROSCOPY TIME:  2 minutes 33 seconds Images submitted: 6 FINDINGS: Images demonstrate presence of an IM nail with proximal and distal locking screws identified within the tibia across a mildly displaced oblique fracture of the distal  RIGHT tibial metadiaphysis. Knee and ankle joint alignments normal. Comminuted fracture of the proximal fibular diaphysis is also seen. Bones appear demineralized. IMPRESSION: Post nailing of distal RIGHT tibial fracture. Comminuted displaced proximal RIGHT fibular diaphyseal fracture. Electronically Signed   By: [name] M.D.   On: 06/17/2019 16:33    Impression/Recommendations Principal Problem:   Closed fracture of right distal tibia Active Problems:   Diabetes ([place])   Essential hypertension   ESRD on dialysis ([place])   Mild intellectual disability   Postoperative anemia  1. R distal fib fx - 1. POD #1 ORIF 2. Management per ortho 2. Post op anemia - on top of anemia of CKD, is tachy to 120 with dialysis 1. Check hemoccult with next Bm 2. Though no reports suggesting GIB 3. Transfuse 2u PRBC overnight 4. ESA dosing per nephro 5. Repeat CBC in AM 3. ESRD - 1. Nephro following 2. Currently on dialysis 3. Monitor for fluid overload with PRBC transfusion. 4. Looks like they kept her net even with dialysis this evening but wernt going to try to get any fluid off secondary to low HGB and tachy to 120s. 5. Unclear if she will require another round of dialysis tomorrow secondary to PRBC transfusion ordered for overnight. 4. DM2 - 1. Will resume Lantus at half of her home dosing (so 10u [place] QHS) starting tonight. 2. Sensitive SSI AC 5. BPD - 1. Cont psych meds 6. HTN - 1. Doesn't appear to be on any chronic BP meds   Thank you for this consultation.  Our [facility] hospitalist team will follow the patient with you.   Time Spent: 60 min  Mayola Mcbain M. D.O. [facility] 06/18/2019, 10:14 PM

## 2019-06-18 NOTE — Progress Notes (Addendum)
Renal Navigator notified OP HD clinic/[facility] of patient's hospitalization and negative COVID 19 rapid test result in order to provide continuity of care and safety.   Alphonzo Cruise, [place] Renal Navigator [phone]

## 2019-06-18 NOTE — Progress Notes (Signed)
PT Cancellation Note  Patient Details Name: Kelly [name] MRN: [id number] DOB: [birth year]   Cancelled Treatment:    Reason Eval/Treat Not Completed: Fatigue/lethargy limiting ability to participate.  Pt will be seen for evaluation later if she is able and time allows.   Ramond Dial 06/18/2019, 12:07 PM   Mee Hives, PT MS Acute Rehab Dept. Number: [place] [facility] [place]

## 2019-06-18 NOTE — Progress Notes (Addendum)
[facility] Progress Note   Subjective:   Patient seen and examined at bedside.  Difficult to awaken this AM.  Briefly opens eyes to verbal stimuli, but continues to snore.  Stopped narcotics and gabapentin.  Gave Narcan.  Unsure of baseline mental status.    Objective Vitals:   06/18/19 0029 06/18/19 0330 06/18/19 0904 06/18/19 1015  BP: (!) 150/56 102/76 (!) 124/56   Pulse: (!) 106 89 (!) 117 (!) 119  Resp: 14 14    Temp: 98.5 F (36.9 C) 98.2 F (36.8 C)    TempSrc: Oral Oral    SpO2: 100% 100% 94% 98%  Weight:       Physical Exam General:NAD, chronically ill appearing female, sleeping in bed Heart:RRR Lungs:no mrg Abdomen:soft, NTND Extremities:No LE edema Dialysis Access: LU AVG +b   Filed Weights   06/17/19 1041  Weight: 86.2 kg    Intake/Output Summary (Last 24 hours) at 06/18/2019 1056 Last data filed at 06/18/2019 0453 Gross per 24 hour  Intake 1390 ml  Output 400 ml  Net 990 ml    Additional Objective Labs: Basic Metabolic Panel: Recent Labs  Lab 06/17/19 1058  NA 140  K 3.2*  CL 98  CO2 29  GLUCOSE 110*  BUN 36*  CREATININE 9.08*  CALCIUM 10.4*   CBC: Recent Labs  Lab 06/17/19 1058  WBC 5.7  HGB 8.7*  HCT 28.5*  MCV 102.9*  PLT 224  CBG: Recent Labs  Lab 06/17/19 1042 06/17/19 1545 06/17/19 1716 06/17/19 2133 06/18/19 0923  GLUCAP 93 137* 123* 167* 124*  Studies/Results: Dg Tibia/fibula Right  Result Date: 06/17/2019 CLINICAL DATA:  RIGHT tibial nailing EXAM: DG C-ARM 61-120 MIN; RIGHT TIBIA AND FIBULA - 2 VIEW COMPARISON:  05/20/2019 FLUOROSCOPY TIME:  2 minutes 33 seconds Images submitted: 6 FINDINGS: Images demonstrate presence of an IM nail with proximal and distal locking screws identified within the tibia across a mildly displaced oblique fracture of the distal RIGHT tibial metadiaphysis. Knee and ankle joint alignments normal. Comminuted fracture of the proximal fibular diaphysis is also seen. Bones appear  demineralized. IMPRESSION: Post nailing of distal RIGHT tibial fracture. Comminuted displaced proximal RIGHT fibular diaphyseal fracture. Electronically Signed   By: [name] M.D.   On: 06/17/2019 16:33   Dg C-arm 1-60 Min  Result Date: 06/17/2019 CLINICAL DATA:  RIGHT tibial nailing EXAM: DG C-ARM 61-120 MIN; RIGHT TIBIA AND FIBULA - 2 VIEW COMPARISON:  05/20/2019 FLUOROSCOPY TIME:  2 minutes 33 seconds Images submitted: 6 FINDINGS: Images demonstrate presence of an IM nail with proximal and distal locking screws identified within the tibia across a mildly displaced oblique fracture of the distal RIGHT tibial metadiaphysis. Knee and ankle joint alignments normal. Comminuted fracture of the proximal fibular diaphysis is also seen. Bones appear demineralized. IMPRESSION: Post nailing of distal RIGHT tibial fracture. Comminuted displaced proximal RIGHT fibular diaphyseal fracture. Electronically Signed   By: [name] M.D.   On: 06/17/2019 16:33    Medications: .  ceFAZolin (ANCEF) IV 1 g (06/17/19 1840)   . acetaminophen  500 mg Oral Q6H  . aspirin  81 mg Oral Daily  . budesonide  0.25 mg Nebulization BID  . calcium acetate  1,334 mg Oral BID WC  . Chlorhexidine Gluconate Cloth  6 each Topical Q0600  . cinacalcet  120 mg Oral Q breakfast  . [START ON 06/19/2019] cinacalcet  120 mg Oral Q M,W,F-HD  . divalproex  1,000 mg Oral QPM  . divalproex  500 mg Oral q morning - 10a  . docusate sodium  100 mg Oral QHS  . escitalopram  15 mg Oral Daily  . folic acid  2 mg Oral QHS  . lamoTRIgine  150 mg Oral BID  . multivitamin  1 tablet Oral Daily  . pantoprazole  40 mg Oral Daily  . patiromer  8.4 g Oral Daily  . traZODone  150 mg Oral QHS    Dialysis Orders: Based off orders from recent admission in Early July, will contact OP unit for updated orders  MWF- [facility]  3.25hrs, BFR 450, DFR 600,  EDW ?, 2K/ 2.5Ca  Access: LU AVG  Heparin 2000 bolus and 200 unit qhr sensipar  120mg  qHD Midodrine 10mg  qHD [place] daily phoslo 2 tabs bid   Assessment/Plan: 1.  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  D/c'd narcotics & gabapentin this am as we were unable to awaken - max dose gabapentin 300 mg qd in HD patients.  Ordered Narcan x1.  2.  ESRD -  On HD MWF at [facility].  Plan for HD today off schedule. K 3.2. 3.  Hypertension/volume  - BP currently well controlled.  Appears euvolemic on exam.  Minimal UF with HD tomorrow. 4.  Anemia of CKD - Hgb 8.7. checking labs pre HD. Getting records from OP unit for ESA dosing. 5.  Secondary Hyperparathyroidism -  Ca elevated. Checking labs pre HD. Use low Ca bath, continue sensipar. Hold binders for now d/t elevated Ca. 6.  Nutrition - Renal diet w/fluid restrictions 7. Bipolar d/o - pt lives in a group home, on multiple meds 8. DM - per primary  Jen Mow, PA-C [facility] Pager: [phone] 06/18/2019,10:56 AM  LOS: 1 day   Pt seen, examined and agree w A/P as above.  Kelly Splinter  MD 06/18/2019, 12:14 PM

## 2019-06-19 DIAGNOSIS — Z419 Encounter for procedure for purposes other than remedying health state, unspecified: Secondary | ICD-10-CM

## 2019-06-19 LAB — GLUCOSE, CAPILLARY
Glucose-Capillary: 153 mg/dL — ABNORMAL HIGH (ref 70–99)
Glucose-Capillary: 167 mg/dL — ABNORMAL HIGH (ref 70–99)
Glucose-Capillary: 145 mg/dL — ABNORMAL HIGH (ref 70–99)
Glucose-Capillary: 104 mg/dL — ABNORMAL HIGH (ref 70–99)

## 2019-06-19 LAB — CBC
HCT: 25.5 % — ABNORMAL LOW (ref 36.0–46.0)
Hemoglobin: 8.8 g/dL — ABNORMAL LOW (ref 12.0–15.0)
MCH: 30.6 pg (ref 26.0–34.0)
MCHC: 34.5 g/dL (ref 30.0–36.0)
MCV: 88.5 fL (ref 80.0–100.0)
Platelets: 168 10*3/uL (ref 150–400)
RBC: 2.88 MIL/uL — ABNORMAL LOW (ref 3.87–5.11)
RDW: 18.3 % — ABNORMAL HIGH (ref 11.5–15.5)
WBC: 8 10*3/uL (ref 4.0–10.5)
nRBC: 0 % (ref 0.0–0.2)

## 2019-06-19 LAB — HEPATITIS B SURFACE ANTIGEN: Hepatitis B Surface Ag: NEGATIVE

## 2019-06-19 MED ORDER — DARBEPOETIN ALFA 100 MCG/0.5ML IJ SOSY
100.0000 ug | PREFILLED_SYRINGE | INTRAMUSCULAR | Status: DC
  Administered 2019-06-19: 17:00:00 100 ug via INTRAVENOUS

## 2019-06-19 MED ORDER — CINACALCET HCL 30 MG PO TABS
ORAL_TABLET | ORAL | Status: AC
  Administered 2019-06-19: 17:00:00 120 mg via ORAL
  Filled 2019-06-19: qty 4

## 2019-06-19 MED ORDER — DARBEPOETIN ALFA 100 MCG/0.5ML IJ SOSY
PREFILLED_SYRINGE | INTRAMUSCULAR | Status: AC
  Administered 2019-06-19: 100 ug via INTRAVENOUS
  Filled 2019-06-19: qty 0.5

## 2019-06-19 NOTE — Plan of Care (Signed)
  Problem: Education: Goal: Knowledge of the prescribed therapeutic regimen will improve Outcome: Progressing   Problem: Pain Management: Goal: Pain level will decrease with appropriate interventions Outcome: Progressing   Problem: Skin Integrity: Goal: Will show signs of wound healing Outcome: Progressing   Problem: Health Behavior/Discharge Planning: Goal: Ability to manage health-related needs will improve Outcome: Progressing   Problem: Activity: Goal: Risk for activity intolerance will decrease Outcome: Progressing

## 2019-06-19 NOTE — Progress Notes (Signed)
Back from dialysis and blood transfusions not started yet. Patient is stable, vitals are good except HR 110-120's.  Patient denies complaints.  To start blood soon.

## 2019-06-19 NOTE — Progress Notes (Addendum)
   Subjective:  Patient reports pain as moderate.  Doing much better this am, and more awake, just finished 2 units pRBCs.    Objective:   VITALS:   Vitals:   06/19/19 0345 06/19/19 0400 06/19/19 0441 06/19/19 0645  BP: (!) 126/58 132/62 113/63   Pulse: (!) 118 (!) 116  (!) 114  Resp: 16 16 16 16   Temp: 98.4 F (36.9 C) 99.2 F (37.3 C) 99 F (37.2 C) 99.7 F (37.6 C)  TempSrc: Oral Oral Oral Oral  SpO2: 100% 99% 100% 100%  Weight:        Incision: dressing C/D/I No cellulitis present Compartment soft cap refill < 2 s Endorses SILT dp/sp, minimal toe wiggle 2/2 pain  Lab Results  Component Value Date   WBC 7.3 06/18/2019   HGB 6.1 (LL) 06/18/2019   HCT 19.2 (L) 06/18/2019   MCV 100.5 (H) 06/18/2019   PLT 169 06/18/2019   BMET    Component Value Date/Time   NA 137 06/18/2019 2015   NA 135 (L) 12/24/2014 0954   K 3.6 06/18/2019 2015   K 6.3 (H) 12/24/2014 0954   CL 95 (L) 06/18/2019 2015   CL 100 12/24/2014 0954   CO2 24 06/18/2019 2015   CO2 28 12/24/2014 0954   GLUCOSE 173 (H) 06/18/2019 2015   GLUCOSE 138 (H) 12/24/2014 0954   BUN 45 (H) 06/18/2019 2015   BUN 75 (H) 12/24/2014 0954   CREATININE 11.55 (H) 06/18/2019 2015   CREATININE 8.81 (H) 12/24/2014 0954   CALCIUM 9.9 06/18/2019 2015   CALCIUM 9.6 05/27/2019 1445   GFRNONAA 4 (L) 06/18/2019 2015   GFRNONAA 6 (L) 12/24/2014 0954   GFRNONAA 3 (L) 08/02/2014 1150   GFRAA 4 (L) 06/18/2019 2015   GFRAA 7 (L) 12/24/2014 0954   GFRAA 4 (L) 08/02/2014 1150     Assessment/Plan: 2 Days Post-Op   Principal Problem:   Closed fracture of right distal tibia Active Problems:   Diabetes ([facility])   Essential hypertension   ESRD on dialysis ([facility])   Mild intellectual disability   Postoperative anemia  - tolerating diet, home meds all resumed now, including DM meds  - NWB and maintain dressings/splint to RLE  - up with therapy if able  - appreciate Renal's help with HD   - appreciate Medicine help  with multiple medical morbidities.    - she appears more at her baseline this am, s/p pRBCs x 2 units  - needs to get up with PT, and get some education of mobility, so anticipate inpatient for at least another day, maybe through the weekend with Dispo back to group home.  Nicholes Stairs 06/19/2019, 7:03 AM   Geralynn Rile, MD [phone]

## 2019-06-19 NOTE — Progress Notes (Signed)
Pre-dialysis report called and given to Claiborne Billings, Therapist, sports. All questions answered to satisfaction. Awaiting transport. Will continue to monitor.

## 2019-06-19 NOTE — Progress Notes (Signed)
[facility]  PROGRESS NOTE/consultation follow up   Kelly [name] SEG:[id number] DOB: [birth year] DOA: 06/17/2019 PCP: Lorelee Market, MD  Brief History    Kelly [name] is a 38 y.o. year old female with medical history significant for ESRD on MWF dialysis, DM, HTN, BPD, intellectual disability, alcohol abuse who presented from her group home on 06/17/2019 with right ankle pain following a fall that occurred in June.  Patient at that time was placed in a splint with instructions to follow-up with orthopedics.  She failed to follow-up and was not seen until about a month after the injury at which time she was noted to have shortened distal tibia fracture with some early healing noted of the fibula in a malunited position.  She was admitted to orthopedic service and underwent ORIF on 7/22.    Hospital service was consulted on 7/23 for evaluation management of postoperative anemia with hemoglobin of 6.1.  Patient baseline hemoglobin is 8 setting of anemia of chronic disease related to ESRD.  During hospital stay patient has had no reported hematochezia, hematemesis  A & P    Closed fracture right distal tibia, status post IM nailing on 7/22  Managed per primary (Ortho), PT to evaluate  PRN Tylenol    Acute on chronic anemia, improving  Acutely worsened in setting of acute blood loss anemia related to recent surgery.    Status post 2 units of blood on 7/23   hgb back to baseline, currently 8.8, monitor daily  Remains asymptomatic.  No signs or symptoms of other bleeding diathesis.  Nephrology plans to start Aranesp today   Sirs criteria.    Overnight patient was febrile 100.9 and has remained tachycardic in the 120s.    Has no leukocytosis no localizing signs or symptoms of infection  Continue encourage use of incentive spirometry, avoid postoperative atelectasis  If fever continues to persist will pursue blood cultures  Reported alcohol abuse history, will  closely monitor on CIWA in case this is withdrawal   Bipolar disorder.    continue home Depakote, Lexapro, Lamictal   Type 2 diabetes, well controlled  A1c 6.3, FBG at goal here, continue Lantus 10 units (reduced from home dose of 20U), sliding scale, monitor CBGs   ESRD on HD  Nephrology following, continue Monday, Wednesday, Friday sessions  Continue multivitamin  Continue bone mineral disease    GERD, stable continue Protonix   COPD, stable  Continue Flovent, budesonide, albuterol as needed   Hypertension, at goal.  No longer on BP medications (first discontinued on previous hospital stay due to soft blood pressures at that time).   Intellectual disability  Unclear mental status baseline  Did have some increased lethargy on 7/23 this seems to improve discontinuation of narcotics and gabapentin  Continue to monitor       DVT prophylaxis: Holding while evaluating anemia Code Status: Full Family Communication: No family at bedside    [facility] Direct contact: see [email or web address] (further directions at bottom of note if needed) 7PM-7AM contact night coverage as at bottom of note 06/19/2019, 10:28 AM  LOS: 2 days     Procedures   7/22, intramedullary nail to right tibia, by Dr. Victorino December  Antibiotics   none  Interval History/Subjective  Feels ok. Ate some of breakfast. Says she's trying to wake up  Objective   Vitals:  Vitals:   06/19/19 0818 06/19/19 0938  BP: (!) 131/50   Pulse: (!) 123   Resp: 18   Temp: Marland Kitchen)  100.9 F (38.3 C) 99.8 F (37.7 C)  SpO2: 100%     Exam:  Awake, sleepy but easily arousable by voice, oriented to self, not to place [facility]), time ( 2019) [place].AT, no appreciable deficits Symmetrical Chest wall movement, Good air movement bilaterally, CTAB tachycardic,No Gallops,Rubs or new Murmurs, No Parasternal Heave +ve B.Sounds, Abd Soft, No tenderness, No organomegaly appriciated, No rebound - guarding  or rigidity. No Cyanosis, Clubbing or edema, No new Rash or bruise     I have personally reviewed the following:   Data Reviewed: Basic Metabolic Panel: Recent Labs  Lab 06/17/19 1058 06/18/19 2015  NA 140 137  K 3.2* 3.6  CL 98 95*  CO2 29 24  GLUCOSE 110* 173*  BUN 36* 45*  CREATININE 9.08* 11.55*  CALCIUM 10.4* 9.9  PHOS  --  4.1   Liver Function Tests: Recent Labs  Lab 06/18/19 2015  ALBUMIN 2.7*   No results for input(s): LIPASE, AMYLASE in the last 168 hours. No results for input(s): AMMONIA in the last 168 hours. CBC: Recent Labs  Lab 06/17/19 1058 06/18/19 2015 06/19/19 0831  WBC 5.7 7.3 8.0  HGB 8.7* 6.1* 8.8*  HCT 28.5* 19.2* 25.5*  MCV 102.9* 100.5* 88.5  PLT 224 169 168   Cardiac Enzymes: No results for input(s): CKTOTAL, CKMB, CKMBINDEX, TROPONINI in the last 168 hours. BNP (last 3 results) No results for input(s): BNP in the last 8760 hours.  ProBNP (last 3 results) No results for input(s): PROBNP in the last 8760 hours.  CBG: Recent Labs  Lab 06/18/19 0923 06/18/19 1148 06/18/19 1648 06/19/19 0222 06/19/19 0816  GLUCAP 124* 119* 140* 153* 167*    Recent Results (from the past 240 hour(s))  SARS Coronavirus 2 (Performed in [place] hospital lab)     Status: None   Collection Time: 06/16/19 11:05 AM   Specimen: Nasal Swab  Result Value Ref Range Status   SARS Coronavirus 2 NEGATIVE NEGATIVE Final    Comment: (NOTE) SARS-CoV-2 target nucleic acids are NOT DETECTED. The SARS-CoV-2 RNA is generally detectable in upper and lower respiratory specimens during the acute phase of infection. Negative results do not preclude SARS-CoV-2 infection, do not rule out co-infections with other pathogens, and should not be used as the sole basis for treatment or other patient management decisions. Negative results must be combined with clinical observations, patient history, and epidemiological information. The expected result is  Negative. Fact Sheet for Patients: SugarRoll.be Fact Sheet for Healthcare Providers: https://www.woods-mathews.com/ This test is not yet approved or cleared by the [place] FDA and  has been authorized for detection and/or diagnosis of SARS-CoV-2 by FDA under an Emergency Use Authorization (EUA). This EUA will remain  in effect (meaning this test can be used) for the duration of the COVID-19 declaration under Section 56 4(b)(1) of the Act, 21 U.S.C. section 360bbb-3(b)(1), unless the authorization is terminated or revoked sooner. Performed at [facility], [place] [address], [place], [place] [address]   MRSA PCR Screening     Status: None   Collection Time: 06/18/19  5:07 PM   Specimen: Nasal Mucosa; Nasopharyngeal  Result Value Ref Range Status   MRSA by PCR NEGATIVE NEGATIVE Final    Comment:        The GeneXpert MRSA Assay (FDA approved for NASAL specimens only), is one component of a comprehensive MRSA colonization surveillance program. It is not intended to diagnose MRSA infection nor to guide or monitor treatment for MRSA  infections. Performed at [facility], [place] [address] [place], [place] [address]      Studies: Dg Tibia/fibula Right  Result Date: 06/17/2019 CLINICAL DATA:  RIGHT tibial nailing EXAM: DG C-ARM 61-120 MIN; RIGHT TIBIA AND FIBULA - 2 VIEW COMPARISON:  05/20/2019 FLUOROSCOPY TIME:  2 minutes 33 seconds Images submitted: 6 FINDINGS: Images demonstrate presence of an IM nail with proximal and distal locking screws identified within the tibia across a mildly displaced oblique fracture of the distal RIGHT tibial metadiaphysis. Knee and ankle joint alignments normal. Comminuted fracture of the proximal fibular diaphysis is also seen. Bones appear demineralized. IMPRESSION: Post nailing of distal RIGHT tibial fracture. Comminuted displaced proximal RIGHT fibular diaphyseal fracture. Electronically Signed    By: [name] M.D.   On: 06/17/2019 16:33   Dg C-arm 1-60 Min  Result Date: 06/17/2019 CLINICAL DATA:  RIGHT tibial nailing EXAM: DG C-ARM 61-120 MIN; RIGHT TIBIA AND FIBULA - 2 VIEW COMPARISON:  05/20/2019 FLUOROSCOPY TIME:  2 minutes 33 seconds Images submitted: 6 FINDINGS: Images demonstrate presence of an IM nail with proximal and distal locking screws identified within the tibia across a mildly displaced oblique fracture of the distal RIGHT tibial metadiaphysis. Knee and ankle joint alignments normal. Comminuted fracture of the proximal fibular diaphysis is also seen. Bones appear demineralized. IMPRESSION: Post nailing of distal RIGHT tibial fracture. Comminuted displaced proximal RIGHT fibular diaphyseal fracture. Electronically Signed   By: [name] M.D.   On: 06/17/2019 16:33    Scheduled Meds:  sodium chloride   Intravenous Once   aspirin  81 mg Oral Daily   budesonide  0.25 mg Nebulization BID   Chlorhexidine Gluconate Cloth  6 each Topical Q0600   cinacalcet  120 mg Oral Q M,W,F-HD   darbepoetin (ARANESP) injection - DIALYSIS  100 mcg Intravenous Q Fri-HD   divalproex  1,000 mg Oral QPM   divalproex  500 mg Oral q morning - 10a   docusate sodium  100 mg Oral QHS   escitalopram  15 mg Oral Daily   folic acid  2 mg Oral QHS   insulin aspart  0-9 Units Subcutaneous TID WC   insulin glargine  10 Units Subcutaneous QHS   lamoTRIgine  150 mg Oral BID   multivitamin  1 tablet Oral Daily   pantoprazole  40 mg Oral Daily   traZODone  150 mg Oral QHS   Continuous Infusions:   ceFAZolin (ANCEF) IV Stopped (06/18/19 1742)    Principal Problem:   Closed fracture of right distal tibia Active Problems:   Diabetes ([place])   Essential hypertension   ESRD on dialysis ([place])   Mild intellectual disability   Postoperative anemia      Kelly [name]  [facility]

## 2019-06-19 NOTE — Progress Notes (Signed)
Group home owner, Asencion Noble, called for update on pt status and plan of care. RN answered all questions to satisfaction. Will continue to monitor.

## 2019-06-19 NOTE — Progress Notes (Signed)
PA on call notified of hemoglobin 6.1.  Patient is in dialysis and nurse notified.  Patient is stable per RN.

## 2019-06-19 NOTE — Progress Notes (Signed)
Pt's temp 100.9 oral, BP 131/50, HR 123. Victorino December, MD aware. Pt in no distress at this time. Per Stann Mainland, MD RN will educate and encourage pt to use incentive spirometer. Will continue to monitor.

## 2019-06-19 NOTE — Progress Notes (Signed)
Back from dialysis, weight obtained and recorded.  Vital stable with HR 110's.  Denies complaints.

## 2019-06-19 NOTE — TOC Initial Note (Signed)
Transition of Care [facility]) - Initial/Assessment Note    Patient Details  Name: Kelly [name] MRN: [id number] Date of Birth: Apr 15, 1981  [facility] CM/SW Contact:    Kelly [name], [place] Phone Number: [phone] 06/19/2019, 10:10 AM  Clinical Narrative:                  CSW spoke with Kelly [name] who reports she is the group home owner that patient lives at. She reports patient is from [facility] group home and that the plan is for patient to return when she is medically stable to discharge. Kelly [name] reports she would like PT to work with patient before she discharges, CSW informed this should occur today. Kelly [name] reports no further questions or concerns at this time.   Expected Discharge Plan: Group Home Barriers to Discharge: Continued Medical Work up   Patient Goals and CMS Choice   CMS Medicare.gov Compare Post Acute Care list provided to:: Patient Represenative (must comment)(Kelly [name] (group home owner)) Choice offered to / list presented to : [place] / Guardian  Expected Discharge Plan and Services Expected Discharge Plan: Group Home     Post Acute Care Choice: (Group Home) Living arrangements for the past 2 months: Group Home                                      Prior Living Arrangements/Services Living arrangements for the past 2 months: Group Home Lives with:: Self Patient language and need for interpreter reviewed:: Yes Do you feel safe going back to the place where you live?: Yes      Need for Family Participation in Patient Care: Yes (Comment) Care giver support system in place?: Yes (comment)   Criminal Activity/Legal Involvement Pertinent to Current Situation/Hospitalization: No - Comment as needed  Activities of Daily Living Home Assistive Devices/Equipment: Wheelchair ADL Screening (condition at time of admission) Patient's cognitive ability adequate to safely complete daily activities?: No Is the patient deaf or have difficulty  hearing?: No Does the patient have difficulty seeing, even when wearing glasses/contacts?: No Does the patient have difficulty concentrating, remembering, or making decisions?: Yes Patient able to express need for assistance with ADLs?: No Does the patient have difficulty dressing or bathing?: Yes Independently performs ADLs?: No Communication: Independent Dressing (OT): Independent Grooming: Needs assistance Is this a change from baseline?: Pre-admission baseline Feeding: Independent Bathing: Needs assistance Is this a change from baseline?: Pre-admission baseline Toileting: Needs assistance Is this a change from baseline?: Pre-admission baseline In/Out Bed: Needs assistance Is this a change from baseline?: Pre-admission baseline Walks in Home: Dependent Is this a change from baseline?: Pre-admission baseline Does the patient have difficulty walking or climbing stairs?: Yes Weakness of Legs: Both Weakness of Arms/Hands: None  Permission Sought/Granted Permission sought to share information with : Case Manager, Customer service manager, Family Supports Permission granted to share information with : Yes, Verbal Permission Granted  Share Information with NAME: Kelly [name]     Permission granted to share info w Relationship: Group Home owner  Permission granted to share info w Contact Information: [phone]  Emotional Assessment Appearance:: Appears stated age Attitude/Demeanor/Rapport: Unable to Assess Affect (typically observed): Unable to Assess Orientation: : Oriented to Self Alcohol / Substance Use: Not Applicable Psych Involvement: No (comment)  Admission diagnosis:  Right distal tibia fracture Patient Active Problem List   Diagnosis Date Noted  . Postoperative anemia 06/18/2019  .  Closed fracture of right distal tibia 06/17/2019  . Weakness generalized 05/24/2019  . Influenza A 12/22/2018  . Hypotension 12/22/2018  . Complication from renal dialysis device  11/06/2018  . Multiple fractures of both lower extremities 06/18/2017  . 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 Hyperkalemia 06/25/2016  . Adjustment disorder with mixed disturbance of emotions and conduct   . Diabetes ([place]) 04/23/2015  . Essential hypertension 04/23/2015  . ESRD on dialysis ([place]) 04/23/2015  . Pain of left arm 04/22/2015   PCP:  Kelly Market, MD Pharmacy:  No Pharmacies Listed    Social Determinants of Health (SDOH) Interventions    Readmission Risk Interventions No flowsheet data found.

## 2019-06-19 NOTE — Plan of Care (Signed)
  Problem: Education: Goal: Knowledge of the prescribed therapeutic regimen will improve Outcome: Progressing   Problem: Pain Management: Goal: Pain level will decrease with appropriate interventions Outcome: Progressing   Problem: Activity: Goal: Risk for activity intolerance will decrease Outcome: Progressing   Problem: Pain Managment: Goal: General experience of comfort will improve Outcome: Progressing   Problem: Safety: Goal: Ability to remain free from injury will improve Outcome: Progressing

## 2019-06-19 NOTE — Plan of Care (Signed)
Problem: Education: Goal: Knowledge of General Education information will improve Description: Including pain rating scale, medication(s)/side effects and non-pharmacologic comfort measures Outcome: Progressing   Problem: Clinical Measurements: Goal: Respiratory complications will improve Outcome: Progressing  Goal: Cardiovascular complication will be avoided Outcome: Progressing   Problem: Pain Managment: Goal: General experience of comfort will improve Outcome: Progressing   Problem: Safety: Goal: Ability to remain free from injury will improve Outcome: Progressing   Problem: Skin Integrity: Goal: Risk for impaired skin integrity will decrease Outcome: Progressing

## 2019-06-19 NOTE — Progress Notes (Addendum)
[facility] Progress Note   Subjective:  Seen in room. Denies CP/dyspnea, but ?confused this morning - asked me three times about wanting to have the curtain opened (it was). R leg remains casted. Febrile and  tachycardic overnight.  Objective Vitals:   06/19/19 0441 06/19/19 0645 06/19/19 0818 06/19/19 0938  BP: 113/63  (!) 131/50   Pulse:  (!) 114 (!) 123   Resp: 16 16 18    Temp: 99 F (37.2 C) 99.7 F (37.6 C) (!) 100.9 F (38.3 C) 99.8 F (37.7 C)  TempSrc: Oral Oral Oral Oral  SpO2: 100% 100% 100%   Weight:       Physical Exam General: Well appearing woman, ?confused (baseline intellectual disability) Heart: RRR; no murmur Lungs: CTA anteriorly Abdomen: soft, non-tender Extremities: No LLE edema; R leg casted. Dialysis Access: L AVF + bruit  Additional Objective Labs: Basic Metabolic Panel: Recent Labs  Lab 06/17/19 1058 06/18/19 2015  NA 140 137  K 3.2* 3.6  CL 98 95*  CO2 29 24  GLUCOSE 110* 173*  BUN 36* 45*  CREATININE 9.08* 11.55*  CALCIUM 10.4* 9.9  PHOS  --  4.1   Liver Function Tests: Recent Labs  Lab 06/18/19 2015  ALBUMIN 2.7*   CBC: Recent Labs  Lab 06/17/19 1058 06/18/19 2015 06/19/19 0831  WBC 5.7 7.3 8.0  HGB 8.7* 6.1* 8.8*  HCT 28.5* 19.2* 25.5*  MCV 102.9* 100.5* 88.5  PLT 224 169 168   CBG: Recent Labs  Lab 06/18/19 0923 06/18/19 1148 06/18/19 1648 06/19/19 0222 06/19/19 0816  GLUCAP 124* 119* 140* 153* 167*   Studies/Results: Dg Tibia/fibula Right  Result Date: 06/17/2019 CLINICAL DATA:  RIGHT tibial nailing EXAM: DG C-ARM 61-120 MIN; RIGHT TIBIA AND FIBULA - 2 VIEW COMPARISON:  05/20/2019 FLUOROSCOPY TIME:  2 minutes 33 seconds Images submitted: 6 FINDINGS: Images demonstrate presence of an IM nail with proximal and distal locking screws identified within the tibia across a mildly displaced oblique fracture of the distal RIGHT tibial metadiaphysis. Knee and ankle joint alignments normal. Comminuted  fracture of the proximal fibular diaphysis is also seen. Bones appear demineralized. IMPRESSION: Post nailing of distal RIGHT tibial fracture. Comminuted displaced proximal RIGHT fibular diaphyseal fracture. Electronically Signed   By: [name] M.D.   On: 06/17/2019 16:33   Dg C-arm 1-60 Min  Result Date: 06/17/2019 CLINICAL DATA:  RIGHT tibial nailing EXAM: DG C-ARM 61-120 MIN; RIGHT TIBIA AND FIBULA - 2 VIEW COMPARISON:  05/20/2019 FLUOROSCOPY TIME:  2 minutes 33 seconds Images submitted: 6 FINDINGS: Images demonstrate presence of an IM nail with proximal and distal locking screws identified within the tibia across a mildly displaced oblique fracture of the distal RIGHT tibial metadiaphysis. Knee and ankle joint alignments normal. Comminuted fracture of the proximal fibular diaphysis is also seen. Bones appear demineralized. IMPRESSION: Post nailing of distal RIGHT tibial fracture. Comminuted displaced proximal RIGHT fibular diaphyseal fracture. Electronically Signed   By: [name] M.D.   On: 06/17/2019 16:33   Medications: .  ceFAZolin (ANCEF) IV Stopped (06/18/19 1742)   . sodium chloride   Intravenous Once  . aspirin  81 mg Oral Daily  . budesonide  0.25 mg Nebulization BID  . calcium acetate  1,334 mg Oral BID WC  . Chlorhexidine Gluconate Cloth  6 each Topical Q0600  . cinacalcet  120 mg Oral Q breakfast  . cinacalcet  120 mg Oral Q M,W,F-HD  . divalproex  1,000 mg Oral QPM  . divalproex  500 mg Oral q morning - 10a  . docusate sodium  100 mg Oral QHS  . escitalopram  15 mg Oral Daily  . folic acid  2 mg Oral QHS  . insulin aspart  0-9 Units Subcutaneous TID WC  . insulin glargine  10 Units Subcutaneous QHS  . lamoTRIgine  150 mg Oral BID  . multivitamin  1 tablet Oral Daily  . pantoprazole  40 mg Oral Daily  . patiromer  8.4 g Oral Daily  . traZODone  150 mg Oral QHS    Dialysis Orders: MWF-[facility] [place] 3.25hrs, [id number], DFR600, EDW ?,2K/2.5Ca, LUE AVG, Heparin  2000 bolus + 200/hr pump - Sensipar 120mg  qHD - Midodrine 10mg  qHD - [place] daily - phoslo 2 tabs bid  Assessment/Plan: 1. 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  D/c'd narcotics & gabapentin and Narcan given x 1 on 7/23. 2. ESRD: Usual MWF schedule - off this week, last dialyzed on 7/23. HD again today to get back on usual schedule. 3.  Hypokalemia: K low side - on patiromer (K binder) as outpatient - hold for now. 4. Hypertension/volume: BP controlled, no overt edema on exam. 5. Anemia of CKD: Hgb 8.8 today, s/p 2U PRBCs 7/23. Start Aranesp 140mcg today. 6. Secondary Hyperparathyroidism: Ca ^, Phos stable. Using low Ca bath, continue sensipar.  Holding binders for now d/t elevated Ca. 7. Nutrition - Renal diet w/fluid restrictions 8. Bipolar d/o- pt lives in a group home, on multiple meds 9. DM - per primary   Veneta Penton, PA-C 06/19/2019, 10:08 AM  [facility] Pager: [phone]  Pt seen, examined and agree w A/P as above.  Kelly Splinter  MD 06/19/2019, 4:08 PM

## 2019-06-19 NOTE — Progress Notes (Signed)
CSW acknowledges patient from group home and wishes to return at discharge per RN. CSW will continue to follow for discharge planning needs.   [place], [place]

## 2019-06-19 NOTE — Progress Notes (Signed)
PT Cancellation Note  Patient Details Name: Kelly [name] MRN: [id number] DOB: 1981-02-07   Cancelled Treatment:    Reason Eval/Treat Not Completed: Other (comment).  Pt declined PT now, but this PT has concerns about her transition to group home to follow up.  Will continue to try to assess but given her physical limits of the injury, recommend her to be considered for rehab.  Reattempt PT at another time.   Ramond Dial 06/19/2019, 11:14 AM   Mee Hives, PT MS Acute Rehab Dept. Number: [place]

## 2019-06-20 LAB — CBC
HCT: 24.9 % — ABNORMAL LOW (ref 36.0–46.0)
Hemoglobin: 8.1 g/dL — ABNORMAL LOW (ref 12.0–15.0)
MCH: 30.6 pg (ref 26.0–34.0)
MCHC: 32.5 g/dL (ref 30.0–36.0)
MCV: 94 fL (ref 80.0–100.0)
Platelets: 189 10*3/uL (ref 150–400)
RBC: 2.65 MIL/uL — ABNORMAL LOW (ref 3.87–5.11)
RDW: 19 % — ABNORMAL HIGH (ref 11.5–15.5)
WBC: 7.7 10*3/uL (ref 4.0–10.5)
nRBC: 0 % (ref 0.0–0.2)

## 2019-06-20 LAB — TYPE AND SCREEN
ABO/RH(D): A POS
Antibody Screen: NEGATIVE
Unit division: 0
Unit division: 0

## 2019-06-20 LAB — BPAM RBC
Blood Product Expiration Date: 202008142359
Blood Product Expiration Date: 202008142359
ISSUE DATE / TIME: 202007240128
ISSUE DATE / TIME: 202007240411
Unit Type and Rh: 6200
Unit Type and Rh: 6200

## 2019-06-20 LAB — GLUCOSE, CAPILLARY
Glucose-Capillary: 147 mg/dL — ABNORMAL HIGH (ref 70–99)
Glucose-Capillary: 144 mg/dL — ABNORMAL HIGH (ref 70–99)
Glucose-Capillary: 142 mg/dL — ABNORMAL HIGH (ref 70–99)
Glucose-Capillary: 128 mg/dL — ABNORMAL HIGH (ref 70–99)

## 2019-06-20 LAB — HEPATITIS B CORE ANTIBODY, TOTAL: Hep B Core Total Ab: NEGATIVE

## 2019-06-20 LAB — HEPATITIS B SURFACE ANTIBODY,QUALITATIVE: Hep B S Ab: REACTIVE

## 2019-06-20 MED ORDER — PRO-STAT SUGAR FREE PO LIQD
30.0000 mL | Freq: Two times a day (BID) | ORAL | Status: DC
  Administered 2019-06-20 – 2019-06-25 (×11): 30 mL via ORAL
  Filled 2019-06-20 (×11): qty 30

## 2019-06-20 MED ORDER — ENSURE ENLIVE PO LIQD
237.0000 mL | Freq: Three times a day (TID) | ORAL | Status: DC
  Administered 2019-06-20 – 2019-06-24 (×11): 237 mL via ORAL

## 2019-06-20 NOTE — Progress Notes (Signed)
[facility] Progress Note   Subjective:  Seen in room. Denies CP/dyspnea, but ?confused this morning - asked me three times about wanting to have the curtain opened (it was). R leg remains casted. Febrile and  tachycardic overnight.  Objective Vitals:   06/19/19 1850 06/19/19 1942 06/20/19 0500 06/20/19 0853  BP: (!) 150/54 (!) 145/61 (!) 151/66 (!) 149/68  Pulse: (!) 109 (!) 117 (!) 114   Resp: 20     Temp:  98.3 F (36.8 C) 99.5 F (37.5 C) 98.9 F (37.2 C)  TempSrc:  Oral Oral Oral  SpO2:  98% 100%   Weight:  89.2 kg     Physical Exam General: Well appearing woman, ?confused (baseline intellectual disability) Heart: RRR; no murmur Lungs: CTA anteriorly Abdomen: soft, non-tender Extremities: No LLE edema; R leg casted. Dialysis Access: L AVF + bruit  Additional Objective Labs: Basic Metabolic Panel: Recent Labs  Lab 06/17/19 1058 06/18/19 2015  NA 140 137  K 3.2* 3.6  CL 98 95*  CO2 29 24  GLUCOSE 110* 173*  BUN 36* 45*  CREATININE 9.08* 11.55*  CALCIUM 10.4* 9.9  PHOS  --  4.1   Liver Function Tests: Recent Labs  Lab 06/18/19 2015  ALBUMIN 2.7*   CBC: Recent Labs  Lab 06/17/19 1058 06/18/19 2015 06/19/19 0831 06/20/19 0501  WBC 5.7 7.3 8.0 7.7  HGB 8.7* 6.1* 8.8* 8.1*  HCT 28.5* 19.2* 25.5* 24.9*  MCV 102.9* 100.5* 88.5 94.0  PLT 224 169 168 189   CBG: Recent Labs  Lab 06/19/19 0816 06/19/19 1230 06/19/19 2117 06/20/19 0756 06/20/19 1137  GLUCAP 167* 145* 104* 147* 144*   Studies/Results: No results found. Medications: .  ceFAZolin (ANCEF) IV Stopped (06/18/19 1742)   . sodium chloride   Intravenous Once  . aspirin  81 mg Oral Daily  . budesonide  0.25 mg Nebulization BID  . Chlorhexidine Gluconate Cloth  6 each Topical Q0600  . cinacalcet  120 mg Oral Q M,W,F-HD  . darbepoetin (ARANESP) injection - DIALYSIS  100 mcg Intravenous Q Fri-HD  . divalproex  1,000 mg Oral QPM  . divalproex  500 mg Oral q morning - 10a   . docusate sodium  100 mg Oral QHS  . escitalopram  15 mg Oral Daily  . feeding supplement (ENSURE ENLIVE)  237 mL Oral TID BM  . feeding supplement (PRO-STAT SUGAR FREE 64)  30 mL Oral BID  . folic acid  2 mg Oral QHS  . insulin aspart  0-9 Units Subcutaneous TID WC  . insulin glargine  10 Units Subcutaneous QHS  . lamoTRIgine  150 mg Oral BID  . multivitamin  1 tablet Oral Daily  . pantoprazole  40 mg Oral Daily  . traZODone  150 mg Oral QHS    Dialysis Orders: MWF-[facility] 3.25hrs, [id number], DFR600, EDW ?,2K/2.5Ca, LUE AVG, Heparin 2000 bolus + 200/hr pump - Sensipar 120mg  qHD - Midodrine 10mg  qHD - [place] daily - phoslo 2 tabs bid  Assessment/Plan: 1. 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  D/c'd narcotics & gabapentin and Narcan given x 1 on 7/23. 2. ESRD: Usual MWF schedule. Next HD Monday 3.  Hypokalemia: K low side - on patiromer (K binder) as outpatient - hold for now.  4. Hypertension/volume: BP controlled, no overt edema on exam. 5. Anemia of CKD: Hgb 8.8 today, s/p 2U PRBCs 7/23. Start Aranesp 17mcg today. 6.  Secondary Hyperparathyroidism: Ca ^, Phos stable. Using low Ca bath, continue sensipar.  Holding binders for now d/t elevated Ca. 7. Nutrition - Renal diet w/fluid restrictions 8. Bipolar d/o- pt lives in a group home, on multiple meds 9. DM - per primary    Kelly Splinter  MD 06/20/2019, 12:30 PM

## 2019-06-20 NOTE — Plan of Care (Signed)
  Problem: Pain Management: Goal: Pain level will decrease with appropriate interventions Outcome: Progressing   Problem: Activity: Goal: Risk for activity intolerance will decrease Outcome: Progressing   Problem: Nutrition: Goal: Adequate nutrition will be maintained Outcome: Progressing   Problem: Coping: Goal: Level of anxiety will decrease Outcome: Progressing   

## 2019-06-20 NOTE — Progress Notes (Signed)
PT Cancellation Note  Patient Details Name: Kelly [name] MRN: [id number] DOB: [birth year]   Cancelled Treatment:    Reason Eval/Treat Not Completed: Fatigue/lethargy limiting ability to participate   With sternal rub I was able to get pt to arouse briefly. She refused PT and when I asked why she stated "cuz I said so".  Will continue to attempt to evaluate pt and it appears as if it is going to be difficult to get her to agree to work with PT  [name] 06/20/2019, 1:49 PM  [name], H[place]  Pager [phone] Office [phone] 06/20/2019

## 2019-06-20 NOTE — Progress Notes (Signed)
Initial Nutrition Assessment  RD working remotely.  DOCUMENTATION CODES:   Obesity unspecified  INTERVENTION:   - Continue rena-vit daily  - Ensure Enlive po TID, each supplement provides 350 kcal and 20 grams of protein  - Pro-stat 30 ml BID, each supplement provides 100 kcal and 15 grams of protein  - Encourage adequate PO intake  NUTRITION DIAGNOSIS:   Increased nutrient needs related to post-op healing, chronic illness (ESRD on HD) as evidenced by estimated needs.  GOAL:   Patient will meet greater than or equal to 90% of their needs  MONITOR:   PO intake, Supplement acceptance, Labs, I & O's, Weight trends, Skin  REASON FOR ASSESSMENT:   Consult Assessment of nutrition requirement/status  ASSESSMENT:   38 year old female who presented on 7/22 from group home for intramedullary nailing of the right tibia for right tibia/fibula fracture. PMH of ESRD on HD, HTN, T2DM, bipolar disorder, intellectual disability/cognitive impairment.   Reviewed RD note from previous admission on 7/02. At that time, pt had poor/refusal of PO intake. GOC were discussed with pt and legal guardian who reported pt would not keep NGT or PEG in place. Pt was consuming Ensure Enlive at that time. RD will reorder along with Pro-stat.  Unable to reach pt via phone call to room.  Weight stable over the last year.  Per RN edema assessment, pt with non-pitting edema to RLE.  Meal Completion: 0% x 3 meals  Medications reviewed and include: Sensipar, Aranesp, Colace, folic acid, SSI, Lantus 10 units daily, rena-vit, Protonix, IV abx  Labs reviewed: hemoglobin 8.1 CBG's: 104-167 x 24 hours  HD net UF 7/24: 500 ml I/O's: +2.8 L since admit  NUTRITION - FOCUSED PHYSICAL EXAM:  Unable to complete at this time. RD working remotely.  Diet Order:   Diet Order            Diet Carb Modified Fluid consistency: Thin; Room service appropriate? Yes  Diet effective now              EDUCATION  NEEDS:   Not appropriate for education at this time  Skin:  Skin Assessment: Skin Integrity Issues: Incisions: closed incision to left leg  Last BM:  no documented BM  Height:   Ht Readings from Last 1 Encounters:  05/24/19 5\' 1"  (1.549 m)    Weight:   Wt Readings from Last 1 Encounters:  06/19/19 89.2 kg    Ideal Body Weight:  47.7 kg  BMI:  Body mass index is 37.16 kg/m.  Estimated Nutritional Needs:   Kcal:  1800-2000  Protein:  100-115 grams  Fluid:  UOP + 1000 ml    Gaynell Face, MS, RD, LDN Inpatient Clinical Dietitian Pager: [phone] Weekend/After Hours: [phone]

## 2019-06-20 NOTE — Progress Notes (Signed)
[facility]  PROGRESS NOTE/consultation follow up   [name] OFB:[id number] DOB: March 05, 1981 DOA: 06/17/2019 PCP: Lorelee Market, MD  Brief History    Kelly [name] is a 38 y.o. year old female with medical history significant for ESRD on MWF dialysis, DM, HTN, BPD, intellectual disability, alcohol abuse who presented from her group home on 06/17/2019 with right ankle pain following a fall that occurred in June.  Patient at that time was placed in a splint with instructions to follow-up with orthopedics.  She failed to follow-up and was not seen until about a month after the injury at which time she was noted to have shortened distal tibia fracture with some early healing noted of the fibula in a malunited position.  She was admitted to orthopedic service and underwent ORIF on 7/22.    Hospital service was consulted on 7/23 for evaluation management of postoperative anemia with hemoglobin of 6.1.  Patient baseline hemoglobin is 8 setting of anemia of chronic disease related to ESRD.  During hospital stay patient has had no reported hematochezia, hematemesis  A & P    Closed fracture right distal tibia, status post IM nailing on 7/22  Managed per primary (Ortho), PT to evaluate  PRN Tylenol    Acute on chronic anemia, stable  Acutely worsened in setting of acute blood loss anemia related to recent surgery.    Status post 2 units of blood on 7/23   hgb back to baseline, currently 8.2, monitor daily  Remains asymptomatic.  No signs or symptoms of other bleeding diathesis.  Nephrology started Aranesp    Sirs criteria.    Fever of 100.9 on 7/20 4 AM, has remained afebrile since, still slightly tachycardic in the 110s without leukocytosis  no localizing signs or symptoms of infection  Continue encourage use of incentive spirometry to avoid postoperative atelectasis  If febrile again obtain blood cultures  Reported alcohol abuse history, will closely monitor  on CIWA in case this is withdrawal   Bipolar disorder.    continue home Depakote, Lexapro, Lamictal   Type 2 diabetes, well controlled  A1c 6.3,  FBGs ranging 90-100 F  We will discontinue Lantus given decrease appetite/oral intake, closely monitor CBGs and sliding scale as needed  Nutrition consulted given diminished oral intake/appetite    ESRD on HD  Nephrology following, continue Monday, Wednesday, Friday sessions per nephrology  Continue multivitamin  Continue Sensipar, holding binders for elevated calcium   GERD, stable continue Protonix   COPD, stable  Continue Flovent, budesonide, albuterol as needed   Hypertension, at goal.  No longer on BP medications (first discontinued on previous hospital stay due to soft blood pressures at that time).   Intellectual disability  Unclear mental status baseline  Did have some increased lethargy on 7/23 this seems to have improved withdiscontinuation of narcotics and gabapentin  She is alert, oriented x4  Continue to monitor       DVT prophylaxis: Holding while evaluating anemia Code Status: Full Family Communication: No family at bedside    [facility] Direct contact: see [email or web address] (further directions at bottom of note if needed) 7PM-7AM contact night coverage as at bottom of note 06/20/2019, 5:34 PM  LOS: 3 days     Procedures  . 7/22, intramedullary nail to right tibia, by Dr. Victorino December  Antibiotics  . none  Interval History/Subjective  Feels ok. Ate some of breakfast. Says she's trying to wake up  Objective   Vitals:  Vitals:  06/20/19 0500 06/20/19 0853  BP: (!) 151/66 (!) 149/68  Pulse: (!) 114   Resp:    Temp: 99.5 F (37.5 C) 98.9 F (37.2 C)  SpO2: 100%     Exam:  Awake, alert, oriented to person, place, time, self much improved from yesterday exam [place].AT, no appreciable deficits Symmetrical Chest wall movement, Good air movement bilaterally, CTAB  tachycardic,No Gallops,Rubs or new Murmurs, No Parasternal Heave +ve B.Sounds, Abd Soft, No tenderness, No organomegaly appriciated, No rebound - guarding or rigidity. No Cyanosis, Clubbing or edema, No new Rash or bruise     I have personally reviewed the following:   Data Reviewed: Basic Metabolic Panel: Recent Labs  Lab 06/17/19 1058 06/18/19 2015  NA 140 137  K 3.2* 3.6  CL 98 95*  CO2 29 24  GLUCOSE 110* 173*  BUN 36* 45*  CREATININE 9.08* 11.55*  CALCIUM 10.4* 9.9  PHOS  --  4.1   Liver Function Tests: Recent Labs  Lab 06/18/19 2015  ALBUMIN 2.7*   No results for input(s): LIPASE, AMYLASE in the last 168 hours. No results for input(s): AMMONIA in the last 168 hours. CBC: Recent Labs  Lab 06/17/19 1058 06/18/19 2015 06/19/19 0831 06/20/19 0501  WBC 5.7 7.3 8.0 7.7  HGB 8.7* 6.1* 8.8* 8.1*  HCT 28.5* 19.2* 25.5* 24.9*  MCV 102.9* 100.5* 88.5 94.0  PLT 224 169 168 189   Cardiac Enzymes: No results for input(s): CKTOTAL, CKMB, CKMBINDEX, TROPONINI in the last 168 hours. BNP (last 3 results) No results for input(s): BNP in the last 8760 hours.  ProBNP (last 3 results) No results for input(s): PROBNP in the last 8760 hours.  CBG: Recent Labs  Lab 06/19/19 1230 06/19/19 2117 06/20/19 0756 06/20/19 1137 06/20/19 1622  GLUCAP 145* 104* 147* 144* 142*    Recent Results (from the past 240 hour(s))  SARS Coronavirus 2 (Performed in [place] hospital lab)     Status: None   Collection Time: 06/16/19 11:05 AM   Specimen: Nasal Swab  Result Value Ref Range Status   SARS Coronavirus 2 NEGATIVE NEGATIVE Final    Comment: (NOTE) SARS-CoV-2 target nucleic acids are NOT DETECTED. The SARS-CoV-2 RNA is generally detectable in upper and lower respiratory specimens during the acute phase of infection. Negative results do not preclude SARS-CoV-2 infection, do not rule out co-infections with other pathogens, and should not be used as the sole basis for  treatment or other patient management decisions. Negative results must be combined with clinical observations, patient history, and epidemiological information. The expected result is Negative. Fact Sheet for Patients: SugarRoll.be Fact Sheet for Healthcare Providers: https://www.woods-mathews.com/ This test is not yet approved or cleared by the [place] FDA and  has been authorized for detection and/or diagnosis of SARS-CoV-2 by FDA under an Emergency Use Authorization (EUA). This EUA will remain  in effect (meaning this test can be used) for the duration of the COVID-19 declaration under Section 56 4(b)(1) of the Act, 21 U.S.C. section 360bbb-3(b)(1), unless the authorization is terminated or revoked sooner. Performed at [facility], [place] [address] [place], [place] [address]   MRSA PCR Screening     Status: None   Collection Time: 06/18/19  5:07 PM   Specimen: Nasal Mucosa; Nasopharyngeal  Result Value Ref Range Status   MRSA by PCR NEGATIVE NEGATIVE Final    Comment:        The GeneXpert MRSA Assay (FDA approved for NASAL specimens only), is one component  of a comprehensive MRSA colonization surveillance program. It is not intended to diagnose MRSA infection nor to guide or monitor treatment for MRSA infections. Performed at [facility], [place] [address], [place], [place] [address]      Studies: No results found.  Scheduled Meds: . sodium chloride   Intravenous Once  . aspirin  81 mg Oral Daily  . budesonide  0.25 mg Nebulization BID  . Chlorhexidine Gluconate Cloth  6 each Topical Q0600  . cinacalcet  120 mg Oral Q M,W,F-HD  . darbepoetin (ARANESP) injection - DIALYSIS  100 mcg Intravenous Q Fri-HD  . divalproex  1,000 mg Oral QPM  . divalproex  500 mg Oral q morning - 10a  . docusate sodium  100 mg Oral QHS  . escitalopram  15 mg Oral Daily  . feeding supplement (ENSURE ENLIVE)  237 mL Oral TID BM  .  feeding supplement (PRO-STAT SUGAR FREE 64)  30 mL Oral BID  . folic acid  2 mg Oral QHS  . insulin aspart  0-9 Units Subcutaneous TID WC  . lamoTRIgine  150 mg Oral BID  . multivitamin  1 tablet Oral Daily  . pantoprazole  40 mg Oral Daily  . traZODone  150 mg Oral QHS   Continuous Infusions: .  ceFAZolin (ANCEF) IV Stopped (06/18/19 1742)    Principal Problem:   Closed fracture of right distal tibia Active Problems:   Diabetes ([place])   Essential hypertension   ESRD on dialysis ([place])   Mild intellectual disability   Postoperative anemia      Desiree Hane  [facility]

## 2019-06-21 ENCOUNTER — Encounter (HOSPITAL_COMMUNITY): Payer: Self-pay | Admitting: *Deleted

## 2019-06-21 DIAGNOSIS — R5383 Other fatigue: Secondary | ICD-10-CM

## 2019-06-21 LAB — BASIC METABOLIC PANEL
Anion gap: 12 (ref 5–15)
BUN: 20 mg/dL (ref 6–20)
CO2: 28 mmol/L (ref 22–32)
Calcium: 10.1 mg/dL (ref 8.9–10.3)
Chloride: 99 mmol/L (ref 98–111)
Creatinine, Ser: 5.86 mg/dL — ABNORMAL HIGH (ref 0.44–1.00)
GFR calc Af Amer: 10 mL/min — ABNORMAL LOW (ref >60)
GFR calc non Af Amer: 8 mL/min — ABNORMAL LOW (ref >60)
Glucose, Bld: 269 mg/dL — ABNORMAL HIGH (ref 70–99)
Potassium: 3.5 mmol/L (ref 3.5–5.1)
Sodium: 139 mmol/L (ref 135–145)

## 2019-06-21 LAB — CBC
HCT: 24.6 % — ABNORMAL LOW (ref 36.0–46.0)
Hemoglobin: 7.9 g/dL — ABNORMAL LOW (ref 12.0–15.0)
MCH: 30.3 pg (ref 26.0–34.0)
MCHC: 32.1 g/dL (ref 30.0–36.0)
MCV: 94.3 fL (ref 80.0–100.0)
Platelets: 198 10*3/uL (ref 150–400)
RBC: 2.61 MIL/uL — ABNORMAL LOW (ref 3.87–5.11)
RDW: 17.8 % — ABNORMAL HIGH (ref 11.5–15.5)
WBC: 7 10*3/uL (ref 4.0–10.5)
nRBC: 0 % (ref 0.0–0.2)

## 2019-06-21 LAB — GLUCOSE, CAPILLARY
Glucose-Capillary: 246 mg/dL — ABNORMAL HIGH (ref 70–99)
Glucose-Capillary: 115 mg/dL — ABNORMAL HIGH (ref 70–99)
Glucose-Capillary: 146 mg/dL — ABNORMAL HIGH (ref 70–99)
Glucose-Capillary: 198 mg/dL — ABNORMAL HIGH (ref 70–99)

## 2019-06-21 NOTE — Progress Notes (Addendum)
[facility]  PROGRESS NOTE/consultation follow up   Kelly [name] YWV:[id number] DOB: Mar 05, 1981 DOA: 06/17/2019 PCP: Lorelee Market, MD  Brief History    Kelly [name] is a 38 y.o. year old female with medical history significant for ESRD on MWF dialysis, DM, HTN, BPD, intellectual disability, alcohol abuse who presented from her group home on 06/17/2019 with right ankle pain following a fall that occurred in June.  Patient at that time was placed in a splint with instructions to follow-up with orthopedics.  She failed to follow-up and was not seen until about a month after the injury at which time she was noted to have shortened distal tibia fracture with some early healing noted of the fibula in a malunited position.  She was admitted to orthopedic service and underwent ORIF on 7/22.   Hospital service was consulted on 7/23 for evaluation management of postoperative anemia with hemoglobin of 6.1.  Patient baseline hemoglobin is 8 setting of anemia of chronic disease related to ESRD.  During hospital stay patient has had no reported hematochezia, hematemesis  A & P    Closed fracture right distal tibia, status post IM nailing on 7/22  Managed per primary (Ortho), PT to evaluate  PRN Tylenol    Acute on chronic anemia, stable  Acutely worsened in setting of acute blood loss anemia related to recent surgery.    Status post 2 units of blood on 7/23   hgb back to baseline, currently 7.9, monitor daily  Remains asymptomatic.  No signs or symptoms of other bleeding diathesis.  Nephrology started Aranesp    Sirs criteria.    Fever of 100.9 on 7/20has remained afebrile since, still slightly tachycardic in the 110s without leukocytosis  no localizing signs or symptoms of infection  EKG to evaluate tachycardia  Continue encourage use of incentive spirometry to avoid postoperative atelectasis  If febrile again obtain blood cultures  Reported alcohol abuse  history, will closely monitor on CIWA in case this is withdrawal   Bipolar disorder.    continue home Depakote, Lexapro, Lamictal   Type 2 diabetes, well controlled, glucose very labile  A1c 6.3,  FBGs ranging 115-269  Continue sliding scale for now as she has labile appetite, continue holding Lantus  Nutrition consulted given diminished oral intake/appetite    ESRD on HD  Nephrology following, continue Monday, Wednesday, Friday sessions per nephrology  Continue multivitamin  Continue Sensipar, holding binders for elevated calcium   GERD, stable continue Protonix   COPD, stable  Continue Flovent, budesonide, albuterol as needed   Hypertension, slightly elevated with SBP in the 140s,   continue to closely monitor given prior history of hypotension  No longer on BP medications (first discontinued on previous hospital stay due to soft blood pressures at that time).   Intellectual disability  Unclear mental status baseline  Did have some increased lethargy on 7/23 this improved with discontinuation of narcotics and gabapentin  Again sleepy but arousable on 7/26, will discontinue trazodone and monitor  Continue to monitor       DVT prophylaxis: Holding while evaluating anemia Code Status: Full Family Communication: No family at bedside    [facility] Direct contact: see [email or web address] (further directions at bottom of note if needed) 7PM-7AM contact night coverage as at bottom of note 06/21/2019, 3:20 PM  LOS: 4 days     Procedures  . 7/22, intramedullary nail to right tibia, by Dr. Victorino December  Antibiotics  . none  Interval History/Subjective  Very  sleepy   Objective   Vitals:  Vitals:   06/21/19 0820 06/21/19 1500  BP:  (!) 148/96  Pulse:  (!) 116  Resp:  19  Temp:    SpO2: 96% 100%    Exam:  Awake, alert, oriented to person, place, time, self much improved from yesterday exam [place].AT, no appreciable deficits Symmetrical  Chest wall movement, Good air movement bilaterally, CTAB tachycardic,No Gallops,Rubs or new Murmurs, No Parasternal Heave +ve B.Sounds, Abd Soft, No tenderness, No organomegaly appriciated, No rebound - guarding or rigidity. No Cyanosis, Clubbing or edema, No new Rash or bruise     I have personally reviewed the following:   Data Reviewed: Basic Metabolic Panel: Recent Labs  Lab 06/17/19 1058 06/18/19 2015 06/21/19 0337  NA 140 137 139  K 3.2* 3.6 3.5  CL 98 95* 99  CO2 29 24 28   GLUCOSE 110* 173* 269*  BUN 36* 45* 20  CREATININE 9.08* 11.55* 5.86*  CALCIUM 10.4* 9.9 10.1  PHOS  --  4.1  --    Liver Function Tests: Recent Labs  Lab 06/18/19 2015  ALBUMIN 2.7*   No results for input(s): LIPASE, AMYLASE in the last 168 hours. No results for input(s): AMMONIA in the last 168 hours. CBC: Recent Labs  Lab 06/17/19 1058 06/18/19 2015 06/19/19 0831 06/20/19 0501 06/21/19 0337  WBC 5.7 7.3 8.0 7.7 7.0  HGB 8.7* 6.1* 8.8* 8.1* 7.9*  HCT 28.5* 19.2* 25.5* 24.9* 24.6*  MCV 102.9* 100.5* 88.5 94.0 94.3  PLT 224 169 168 189 198   Cardiac Enzymes: No results for input(s): CKTOTAL, CKMB, CKMBINDEX, TROPONINI in the last 168 hours. BNP (last 3 results) No results for input(s): BNP in the last 8760 hours.  ProBNP (last 3 results) No results for input(s): PROBNP in the last 8760 hours.  CBG: Recent Labs  Lab 06/20/19 1137 06/20/19 1622 06/20/19 2109 06/21/19 0756 06/21/19 1219  GLUCAP 144* 142* 128* 246* 115*    Recent Results (from the past 240 hour(s))  SARS Coronavirus 2 (Performed in [place] hospital lab)     Status: None   Collection Time: 06/16/19 11:05 AM   Specimen: Nasal Swab  Result Value Ref Range Status   SARS Coronavirus 2 NEGATIVE NEGATIVE Final    Comment: (NOTE) SARS-CoV-2 target nucleic acids are NOT DETECTED. The SARS-CoV-2 RNA is generally detectable in upper and lower respiratory specimens during the acute phase of infection. Negative  results do not preclude SARS-CoV-2 infection, do not rule out co-infections with other pathogens, and should not be used as the sole basis for treatment or other patient management decisions. Negative results must be combined with clinical observations, patient history, and epidemiological information. The expected result is Negative. Fact Sheet for Patients: SugarRoll.be Fact Sheet for Healthcare Providers: https://www.woods-mathews.com/ This test is not yet approved or cleared by the [place] FDA and  has been authorized for detection and/or diagnosis of SARS-CoV-2 by FDA under an Emergency Use Authorization (EUA). This EUA will remain  in effect (meaning this test can be used) for the duration of the COVID-19 declaration under Section 56 4(b)(1) of the Act, 21 U.S.C. section 360bbb-3(b)(1), unless the authorization is terminated or revoked sooner. Performed at [facility], [place] [address], [place], [place] [address]   MRSA PCR Screening     Status: None   Collection Time: 06/18/19  5:07 PM   Specimen: Nasal Mucosa; Nasopharyngeal  Result Value Ref Range Status   MRSA by PCR NEGATIVE NEGATIVE Final  Comment:        The GeneXpert MRSA Assay (FDA approved for NASAL specimens only), is one component of a comprehensive MRSA colonization surveillance program. It is not intended to diagnose MRSA infection nor to guide or monitor treatment for MRSA infections. Performed at [facility], [place] [address], [place], [place] [address]      Studies: No results found.  Scheduled Meds: . sodium chloride   Intravenous Once  . aspirin  81 mg Oral Daily  . budesonide  0.25 mg Nebulization BID  . Chlorhexidine Gluconate Cloth  6 each Topical Q0600  . cinacalcet  120 mg Oral Q M,W,F-HD  . darbepoetin (ARANESP) injection - DIALYSIS  100 mcg Intravenous Q Fri-HD  . divalproex  1,000 mg Oral QPM  . divalproex  500 mg Oral q  morning - 10a  . docusate sodium  100 mg Oral QHS  . escitalopram  15 mg Oral Daily  . feeding supplement (ENSURE ENLIVE)  237 mL Oral TID BM  . feeding supplement (PRO-STAT SUGAR FREE 64)  30 mL Oral BID  . folic acid  2 mg Oral QHS  . insulin aspart  0-9 Units Subcutaneous TID WC  . lamoTRIgine  150 mg Oral BID  . multivitamin  1 tablet Oral Daily  . pantoprazole  40 mg Oral Daily  . traZODone  150 mg Oral QHS   Continuous Infusions:   Principal Problem:   Closed fracture of right distal tibia Active Problems:   Diabetes ([place])   Essential hypertension   ESRD on dialysis ([place])   Mild intellectual disability   Postoperative anemia      Desiree Hane  [facility]

## 2019-06-21 NOTE — Evaluation (Signed)
Physical Therapy Evaluation Patient Details Name: Kelly [name] MRN: [id number] DOB: Aug 26, 1981 Today's Date: 06/21/2019   History of Present Illness  38 yo female with onset of fall on June 18 was sent home with a positioning boot on RLE.  Pt was walking on it there, had displaced R Tib fib fracture with ORIF on 06/17/19.  Pt is a group home resident, and was referred to PT for mobility.  PMHx:  cognitive intellectual disability, bipolar disorder, HTN, DM, HD, EtOH abuse.    Clinical Impression  Pt admitted with above diagnosis. Pt currently with functional limitations due to the deficits listed below (see PT Problem List). Pt with periods of alertness today that allowed for some mobility. Max A +2 to get to EOB. Pt alert during transfer but once up began to fall asleep again and needed mod A to maintain sitting. Pt could not remain alert long enough to perform exercises and was not safe to attempt transfer. At current level, question whether she could go back to group home and recommending SNF.  Pt will benefit from skilled PT to increase their independence and safety with mobility to allow discharge to the venue listed below.      Follow Up Recommendations SNF;Supervision/Assistance - 24 hour    Equipment Recommendations  Rolling walker with 5" wheels    Recommendations for Other Services       Precautions / Restrictions Precautions Precautions: Fall Precaution Comments: cognitive delays Restrictions Weight Bearing Restrictions: Yes RLE Weight Bearing: Non weight bearing      Mobility  Bed Mobility Overal bed mobility: Needs Assistance Bed Mobility: Supine to Sit;Sit to Supine     Supine to sit: +2 for physical assistance;Max assist Sit to supine: +2 for physical assistance;Max assist   General bed mobility comments: pt cries out with all mvmt of RLE and does not move it at all on her own. Max facilitation needed to get pt moving towards L rail. Max A for BLE's off EOB, pt  assisted with trunk control into sitting. Max A +2 for return to supine   Transfers                 General transfer comment: pt too lethargic to attempt  Ambulation/Gait             General Gait Details: unable  Stairs            Wheelchair Mobility    Modified Rankin (Stroke Patients Only)       Balance Overall balance assessment: Needs assistance Sitting-balance support: Bilateral upper extremity supported;Feet unsupported Sitting balance-Leahy Scale: Poor Sitting balance - Comments: needed mod A with initial sitting as pt was falling asleep as soon as she achieved upright position. Aroused several times for self care activities and needed only min A to maintain balance at that point. Returned to mod with lethargy                                     Pertinent Vitals/Pain Pain Assessment: Faces Faces Pain Scale: Hurts even more Pain Location: R distal LE Pain Descriptors / Indicators: Sharp Pain Intervention(s): Limited activity within patient's tolerance;Monitored during session    Home Living Family/patient expects to be discharged to:: Group home                      Prior Function Level of Independence: Needs assistance  Comments: pt unable to verbalize PLOF but had 24 hr care in group home PTA     Hand Dominance   Dominant Hand: Left    Extremity/Trunk Assessment   Upper Extremity Assessment Upper Extremity Assessment: Generalized weakness;RUE deficits/detail RUE Deficits / Details: limitations noted in shoulder flexion and pt reported discomfort when reaching for L rail RUE Sensation: WNL RUE Coordination: decreased fine motor;decreased gross motor    Lower Extremity Assessment Lower Extremity Assessment: Generalized weakness;Difficult to assess due to impaired cognition    Cervical / Trunk Assessment Cervical / Trunk Assessment: Normal  Communication   Communication: No difficulties  Cognition  Arousal/Alertness: Lethargic Behavior During Therapy: Flat affect Overall Cognitive Status: No family/caregiver present to determine baseline cognitive functioning                                 General Comments: pt profoundly cognitively impaired at baseline per chart but no caregiver available to relay whether she is currently at her baseline. Question whether this lethargy is somewhat normal for her      General Comments      Exercises     Assessment/Plan    PT Assessment Patient needs continued PT services  PT Problem List Decreased strength;Decreased activity tolerance;Decreased balance;Decreased mobility;Decreased cognition;Decreased coordination;Decreased knowledge of use of DME;Decreased knowledge of precautions;Pain;Obesity       PT Treatment Interventions DME instruction;Functional mobility training;Therapeutic activities;Therapeutic exercise;Balance training;Cognitive remediation;Neuromuscular re-education;Patient/family education;Gait training    PT Goals (Current goals can be found in the Care Plan section)  Acute Rehab PT Goals Patient Stated Goal: pt stated that she wanted to get up PT Goal Formulation: Patient unable to participate in goal setting Time For Goal Achievement: 07/05/19 Potential to Achieve Goals: Fair    Frequency Min 3X/week   Barriers to discharge        Co-evaluation               AM-PAC PT "6 Clicks" Mobility  Outcome Measure Help needed turning from your back to your side while in a flat bed without using bedrails?: A Lot Help needed moving from lying on your back to sitting on the side of a flat bed without using bedrails?: A Lot Help needed moving to and from a bed to a chair (including a wheelchair)?: Total Help needed standing up from a chair using your arms (e.g., wheelchair or bedside chair)?: Total Help needed to walk in hospital room?: Total Help needed climbing 3-5 steps with a railing? : Total 6 Click  Score: 8    End of Session   Activity Tolerance: Patient limited by lethargy Patient left: in bed;with bed alarm set;with call bell/phone within reach Nurse Communication: Mobility status PT Visit Diagnosis: Muscle weakness (generalized) (M62.81);Pain Pain - Right/Left: Right Pain - part of body: Leg    Time: 2440-1027 PT Time Calculation (min) (ACUTE ONLY): 25 min   Charges:   PT Evaluation $PT Eval Moderate Complexity: 1 Mod PT Treatments $Therapeutic Activity: 8-22 mins        Leighton Roach, [place]  Pager [phone] [place] 06/21/2019, 10:51 AM

## 2019-06-21 NOTE — Progress Notes (Addendum)
[facility] Progress Note   Subjective:  Seen in room - resting quietly but wakes and says "I haven't eaten yet, I will" then went right back to sleep. Looks comfortable. Off narcotics and gabapentin, did get a trazodone last night which could explain sleepiness - follow closely.  Objective Vitals:   06/20/19 2013 06/20/19 2111 06/21/19 0510 06/21/19 0820  BP:  (!) 141/66 (!) 148/64   Pulse:  (!) 113 (!) 120   Resp:      Temp:  99 F (37.2 C) 99.3 F (37.4 C)   TempSrc:  Oral Oral   SpO2: 97% 100% 100% 96%  Weight:       Physical Exam General: Sleepy this morning. Heart: RRR; no murmur Lungs: CTA anteriorly Abdomen: soft, non-tender Extremities: No LLE edema; R leg casted. Dialysis Access: L AVF + bruit  Additional Objective Labs: Basic Metabolic Panel: Recent Labs  Lab 06/17/19 1058 06/18/19 2015 06/21/19 0337  NA 140 137 139  K 3.2* 3.6 3.5  CL 98 95* 99  CO2 29 24 28   GLUCOSE 110* 173* 269*  BUN 36* 45* 20  CREATININE 9.08* 11.55* 5.86*  CALCIUM 10.4* 9.9 10.1  PHOS  --  4.1  --    Liver Function Tests: Recent Labs  Lab 06/18/19 2015  ALBUMIN 2.7*   CBC: Recent Labs  Lab 06/17/19 1058 06/18/19 2015 06/19/19 0831 06/20/19 0501 06/21/19 0337  WBC 5.7 7.3 8.0 7.7 7.0  HGB 8.7* 6.1* 8.8* 8.1* 7.9*  HCT 28.5* 19.2* 25.5* 24.9* 24.6*  MCV 102.9* 100.5* 88.5 94.0 94.3  PLT 224 169 168 189 198   CBG: Recent Labs  Lab 06/20/19 0756 06/20/19 1137 06/20/19 1622 06/20/19 2109 06/21/19 0756  GLUCAP 147* 144* 142* 128* 246*   Medications:  . sodium chloride   Intravenous Once  . aspirin  81 mg Oral Daily  . budesonide  0.25 mg Nebulization BID  . Chlorhexidine Gluconate Cloth  6 each Topical Q0600  . cinacalcet  120 mg Oral Q M,W,F-HD  . darbepoetin (ARANESP) injection - DIALYSIS  100 mcg Intravenous Q Fri-HD  . divalproex  1,000 mg Oral QPM  . divalproex  500 mg Oral q morning - 10a  . docusate sodium  100 mg Oral QHS  .  escitalopram  15 mg Oral Daily  . feeding supplement (ENSURE ENLIVE)  237 mL Oral TID BM  . feeding supplement (PRO-STAT SUGAR FREE 64)  30 mL Oral BID  . folic acid  2 mg Oral QHS  . insulin aspart  0-9 Units Subcutaneous TID WC  . lamoTRIgine  150 mg Oral BID  . multivitamin  1 tablet Oral Daily  . pantoprazole  40 mg Oral Daily  . traZODone  150 mg Oral QHS    Dialysis Orders: MWF-[facility]  3.25hrs, [id number], DFR600, EDW ?,2K/2.5Ca, LUE AVG, Heparin 2000 bolus + 200/hr pump - Sensipar 120mg  qHD - Midodrine 10mg  qHD - Veltassa daily - phoslo 2 tabs bid  Assessment/Plan: 1. 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  Patient seems very sensitive to pain medications/ sedating meds.  2. ESRD: Usual MWF schedule - next HD 7/27. 3.  Hypokalemia: K low side - on patiromer (K binder) as outpatient - hold for now.  4. Hypertension/volume: BP rising, no overt edema on exam. ^ UF. 5. Anemia of CKD: Hgb 7.9 today, s/p 2U PRBCs  7/23. Continue Aranesp 172mcg q Fri. 6. Secondary Hyperparathyroidism: Ca ^, Phos stable. Using low Ca bath, continue sensipar.  Holding binders for now d/t elevated Ca. 7. Nutrition - Renal diet w/fluid restrictions 8.  Bipolar d/o- pt lives in a group home, on multiple meds 9.  DM - per primary   Veneta Penton, PA-C 06/21/2019, 9:52 AM  [facility] Pager: [phone]  Pt seen, examined and agree w A/P as above.  Kelly Splinter  MD 06/21/2019, 2:32 PM

## 2019-06-22 ENCOUNTER — Ambulatory Visit: Payer: Medicaid Other

## 2019-06-22 DIAGNOSIS — R000 Tachycardia, unspecified: Secondary | ICD-10-CM | POA: Clinically undetermined

## 2019-06-22 LAB — GLUCOSE, CAPILLARY
Glucose-Capillary: 150 mg/dL — ABNORMAL HIGH (ref 70–99)
Glucose-Capillary: 198 mg/dL — ABNORMAL HIGH (ref 70–99)
Glucose-Capillary: 90 mg/dL (ref 70–99)
Glucose-Capillary: 109 mg/dL — ABNORMAL HIGH (ref 70–99)

## 2019-06-22 NOTE — Progress Notes (Signed)
Pt refused lab draws x2 today. Was educated on importance of blood work and pt still refused labs.  New orders obtained to draw labs in the am as pt is asymptomatic. Renal dialysis nurse called stating dialysis will be rescheduled for tomorrow first case. MD notified of all above mentioned.

## 2019-06-22 NOTE — Progress Notes (Signed)
[facility]  PROGRESS NOTE/consultation follow up   [name] JHE:[id number] DOB: 1981/01/27 DOA: 06/17/2019 PCP: Lorelee Market, MD  Brief History    Tyaisha Cullom is a 38 y.o. year old female with medical history significant for ESRD on MWF dialysis, DM, HTN, BPD, intellectual disability, alcohol abuse who presented from her group home on 06/17/2019 with right ankle pain following a fall that occurred in June.  Patient at that time was placed in a splint with instructions to follow-up with orthopedics.  She failed to follow-up and was not seen until about a month after the injury at which time she was noted to have shortened distal tibia fracture with some early healing noted of the fibula in a malunited position.  She was admitted to orthopedic service and underwent ORIF on 7/22.   Hospital service was consulted on 7/23 for evaluation management of postoperative anemia with hemoglobin of 6.1.  Patient baseline hemoglobin is 8 setting of anemia of chronic disease related to ESRD.  During hospital stay patient has had no reported hematochezia, hematemesis  A & P    Closed fracture right distal tibia, status post IM nailing on 7/22  Managed per primary (Ortho), PT to evaluate  PRN Tylenol    Acute on chronic anemia, stable  Acutely worsened in setting of acute blood loss anemia related to recent surgery.    Status post 2 units of blood on 7/23   hgb back to baseline,  monitor daily  Remains asymptomatic.  No signs or symptoms of other bleeding diathesis.  Nephrology started Aranesp    Sirs criteria. , resolved  Fever of 100.9 on 7/20 has remained afebrile since, still slightly tachycardic in the 110s without leukocytosis  no localizing signs or symptoms of infection  Continue encourage use of incentive spirometry to avoid postoperative atelectasis  If febrile again obtain blood cultures  Reported alcohol abuse history, will closely monitor on CIWA in  case this is withdrawal    Sinus tachycardia  Confirmed on EKG  Heart rate range 102-120 last 24 hours, could be related to diminished oral intake as patient does look a bit dry on examination.  Continue to encourage oral intake (patient does not like much of hospital fluid).  Plan for HD today.   Bipolar disorder.    continue home Depakote, Lexapro, Lamictal   Type 2 diabetes, well controlled, glucose very labile  A1c 6.3,  FBGs ranging 115-269  Continue sliding scale for now as she has labile appetite, continue holding Lantus  Nutrition consulted given diminished oral intake/appetite    ESRD on HD  Nephrology following, continue Monday, Wednesday, Friday sessions per nephrology  Continue multivitamin  Continue Sensipar, holding binders for elevated calcium   GERD, stable continue Protonix   COPD, stable  Continue Flovent, budesonide, albuterol as needed   Hypertension, slightly elevated with SBP in the 140s-150s,   continue to closely monitor given prior history of hypotension requiring discontinuation of prior meds in previous hospital stay  May have some improvement after HD session today,will monitor    Intellectual disability  Per chart review it seems patient sometimes decides not to speak  Today she is alert, oriented to place time context though minimally conversant with me  Very sensitive to pain medication have had stable mental status after discontinuation of narcotics and gabapentin       DVT prophylaxis: Holding while evaluating anemia Code Status: Full Family Communication: No family at bedside    [facility] Direct  contact: see [email or web address] (further directions at bottom of note if needed) 7PM-7AM contact night coverage as at bottom of note 06/22/2019, 1:12 PM  LOS: 5 days     Procedures  . 7/22, intramedullary nail to right tibia, by Dr. Victorino December  Antibiotics  . none  Interval History/Subjective  No acute  complaints.  Does not like the food here.  Objective   Vitals:  Vitals:   06/22/19 0520 06/22/19 0831  BP: (!) 150/77   Pulse: (!) 102   Resp:    Temp: 98.4 F (36.9 C)   SpO2: 98% 96%    Exam:  Awake, alert, oriented to place time context though minimally conversant with me, slow speech, no appreciable deficits [place].AT,  Dry oral mucosa Symmetrical Chest wall movement, Good air movement bilaterally, CTAB Tachycardic,+ SEM,, No Parasternal Heave +ve B.Sounds, Abd Soft, No tenderness, No organomegaly appriciated, No rebound - guarding or rigidity. No Cyanosis, Clubbing or edema, No new Rash or bruise     I have personally reviewed the following:   Data Reviewed: Basic Metabolic Panel: Recent Labs  Lab 06/17/19 1058 06/18/19 2015 06/21/19 0337  NA 140 137 139  K 3.2* 3.6 3.5  CL 98 95* 99  CO2 29 24 28   GLUCOSE 110* 173* 269*  BUN 36* 45* 20  CREATININE 9.08* 11.55* 5.86*  CALCIUM 10.4* 9.9 10.1  PHOS  --  4.1  --    Liver Function Tests: Recent Labs  Lab 06/18/19 2015  ALBUMIN 2.7*   No results for input(s): LIPASE, AMYLASE in the last 168 hours. No results for input(s): AMMONIA in the last 168 hours. CBC: Recent Labs  Lab 06/17/19 1058 06/18/19 2015 06/19/19 0831 06/20/19 0501 06/21/19 0337  WBC 5.7 7.3 8.0 7.7 7.0  HGB 8.7* 6.1* 8.8* 8.1* 7.9*  HCT 28.5* 19.2* 25.5* 24.9* 24.6*  MCV 102.9* 100.5* 88.5 94.0 94.3  PLT 224 169 168 189 198   Cardiac Enzymes: No results for input(s): CKTOTAL, CKMB, CKMBINDEX, TROPONINI in the last 168 hours. BNP (last 3 results) No results for input(s): BNP in the last 8760 hours.  ProBNP (last 3 results) No results for input(s): PROBNP in the last 8760 hours.  CBG: Recent Labs  Lab 06/21/19 1219 06/21/19 1703 06/21/19 2112 06/22/19 0753 06/22/19 1124  GLUCAP 115* 146* 198* 150* 198*    Recent Results (from the past 240 hour(s))  SARS Coronavirus 2 (Performed in [place] hospital lab)     Status:  None   Collection Time: 06/16/19 11:05 AM   Specimen: Nasal Swab  Result Value Ref Range Status   SARS Coronavirus 2 NEGATIVE NEGATIVE Final    Comment: (NOTE) SARS-CoV-2 target nucleic acids are NOT DETECTED. The SARS-CoV-2 RNA is generally detectable in upper and lower respiratory specimens during the acute phase of infection. Negative results do not preclude SARS-CoV-2 infection, do not rule out co-infections with other pathogens, and should not be used as the sole basis for treatment or other patient management decisions. Negative results must be combined with clinical observations, patient history, and epidemiological information. The expected result is Negative. Fact Sheet for Patients: SugarRoll.be Fact Sheet for Healthcare Providers: https://www.woods-mathews.com/ This test is not yet approved or cleared by the [place] FDA and  has been authorized for detection and/or diagnosis of SARS-CoV-2 by FDA under an Emergency Use Authorization (EUA). This EUA will remain  in effect (meaning this test can be used) for the duration of the COVID-19 declaration under Section 56 4(b)(1) of  the Act, 21 U.S.C. section 360bbb-3(b)(1), unless the authorization is terminated or revoked sooner. Performed at [facility], [place] [address], [place], [place] [address]   MRSA PCR Screening     Status: None   Collection Time: 06/18/19  5:07 PM   Specimen: Nasal Mucosa; Nasopharyngeal  Result Value Ref Range Status   MRSA by PCR NEGATIVE NEGATIVE Final    Comment:        The GeneXpert MRSA Assay (FDA approved for NASAL specimens only), is one component of a comprehensive MRSA colonization surveillance program. It is not intended to diagnose MRSA infection nor to guide or monitor treatment for MRSA infections. Performed at [facility], [place] [address] [place], [place] [address]      Studies: No results found.  Scheduled Meds:  . sodium chloride   Intravenous Once  . aspirin  81 mg Oral Daily  . budesonide  0.25 mg Nebulization BID  . Chlorhexidine Gluconate Cloth  6 each Topical Q0600  . cinacalcet  120 mg Oral Q M,W,F-HD  . darbepoetin (ARANESP) injection - DIALYSIS  100 mcg Intravenous Q Fri-HD  . divalproex  1,000 mg Oral QPM  . divalproex  500 mg Oral q morning - 10a  . docusate sodium  100 mg Oral QHS  . escitalopram  15 mg Oral Daily  . feeding supplement (ENSURE ENLIVE)  237 mL Oral TID BM  . feeding supplement (PRO-STAT SUGAR FREE 64)  30 mL Oral BID  . folic acid  2 mg Oral QHS  . insulin aspart  0-9 Units Subcutaneous TID WC  . lamoTRIgine  150 mg Oral BID  . multivitamin  1 tablet Oral Daily  . pantoprazole  40 mg Oral Daily   Continuous Infusions:   Principal Problem:   Closed fracture of right distal tibia Active Problems:   Diabetes ([place])   Essential hypertension   ESRD on dialysis ([facility])   Mild intellectual disability   Postoperative anemia      Desiree Hane  [facility]

## 2019-06-22 NOTE — Progress Notes (Signed)
[facility] Progress Note   Subjective:  Patient is again sleepy on exam this morning.  Offers little history - states that dialysis is going ok.    Review of systems:  Denies n/v Denies shortness of breath   Objective Vitals:   06/21/19 2003 06/22/19 0520 06/22/19 0831 06/22/19 1100  BP: (!) 150/70 (!) 150/77    Pulse: (!) 113 (!) 102    Resp:      Temp: 98.7 F (37.1 C) 98.4 F (36.9 C)    TempSrc: Axillary Axillary    SpO2: 100% 98% 96%   Weight:    89.2 kg  Height:    5\' 1"  (1.549 m)   Physical Exam General: Sleepy this morning.  Consistent with prior exams.  Heart: tachycardia ; no murmur Lungs: CTA anteriorly Abdomen: soft, non-tender Extremities: No LLE edema; R leg wrapped  Dialysis Access: L AVF + bruit and thrill   Additional Objective Labs: Basic Metabolic Panel: Recent Labs  Lab 06/17/19 1058 06/18/19 2015 06/21/19 0337  NA 140 137 139  K 3.2* 3.6 3.5  CL 98 95* 99  CO2 29 24 28   GLUCOSE 110* 173* 269*  BUN 36* 45* 20  CREATININE 9.08* 11.55* 5.86*  CALCIUM 10.4* 9.9 10.1  PHOS  --  4.1  --    Liver Function Tests: Recent Labs  Lab 06/18/19 2015  ALBUMIN 2.7*   CBC: Recent Labs  Lab 06/17/19 1058 06/18/19 2015 06/19/19 0831 06/20/19 0501 06/21/19 0337  WBC 5.7 7.3 8.0 7.7 7.0  HGB 8.7* 6.1* 8.8* 8.1* 7.9*  HCT 28.5* 19.2* 25.5* 24.9* 24.6*  MCV 102.9* 100.5* 88.5 94.0 94.3  PLT 224 169 168 189 198   CBG: Recent Labs  Lab 06/21/19 1219 06/21/19 1703 06/21/19 2112 06/22/19 0753 06/22/19 1124  GLUCAP 115* 146* 198* 150* 198*   Medications:  . sodium chloride   Intravenous Once  . aspirin  81 mg Oral Daily  . budesonide  0.25 mg Nebulization BID  . Chlorhexidine Gluconate Cloth  6 each Topical Q0600  . cinacalcet  120 mg Oral Q M,W,F-HD  . darbepoetin (ARANESP) injection - DIALYSIS  100 mcg Intravenous Q Fri-HD  . divalproex  1,000 mg Oral QPM  . divalproex  500 mg Oral q morning - 10a  . docusate sodium   100 mg Oral QHS  . escitalopram  15 mg Oral Daily  . feeding supplement (ENSURE ENLIVE)  237 mL Oral TID BM  . feeding supplement (PRO-STAT SUGAR FREE 64)  30 mL Oral BID  . folic acid  2 mg Oral QHS  . insulin aspart  0-9 Units Subcutaneous TID WC  . lamoTRIgine  150 mg Oral BID  . multivitamin  1 tablet Oral Daily  . 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  Patient seems very sensitive to pain medications/ sedating meds.  2. ESRD: HD per MWF schedule 3.  Hypokalemia: K acceptable last check; holding outpatient patiromer (K binder) for now.   4. Hypertension/volume: BP rising, no overt edema on exam. UF with HD  5. Anemia of CKD: Hgb 7.9, s/p  2U PRBCs 7/23. Continue Aranesp 187mcg q Fri. 6. Secondary Hyperparathyroidism: hypercalcemia, Phos stable. Using low Ca bath, continue sensipar.  Her binders have been held.   7. Nutrition - Renal diet w/fluid restrictions 8.  Bipolar d/o- pt lives in a group home, on multiple meds 9.  DM - per primary   Kelly [name] 06/22/2019 11:36 AM

## 2019-06-22 NOTE — Progress Notes (Signed)
   Subjective:  Patient quite sleepy, but arousable.  No complaints.    Objective:   VITALS:   Vitals:   06/21/19 1500 06/21/19 2003 06/22/19 0520 06/22/19 0831  BP: (!) 148/96 (!) 150/70 (!) 150/77   Pulse: (!) 116 (!) 113 (!) 102   Resp: 19     Temp:  98.7 F (37.1 C) 98.4 F (36.9 C)   TempSrc:  Axillary Axillary   SpO2: 100% 100% 98% 96%  Weight:        Incision: dressing C/D/I No cellulitis present Compartment soft cap refill < 2 s Endorses SILT dp/sp, intact dorsi and plantar flexion of toes  Lab Results  Component Value Date   WBC 7.0 06/21/2019   HGB 7.9 (L) 06/21/2019   HCT 24.6 (L) 06/21/2019   MCV 94.3 06/21/2019   PLT 198 06/21/2019   BMET    Component Value Date/Time   NA 139 06/21/2019 0337   NA 135 (L) 12/24/2014 0954   K 3.5 06/21/2019 0337   K 6.3 (H) 12/24/2014 0954   CL 99 06/21/2019 0337   CL 100 12/24/2014 0954   CO2 28 06/21/2019 0337   CO2 28 12/24/2014 0954   GLUCOSE 269 (H) 06/21/2019 0337   GLUCOSE 138 (H) 12/24/2014 0954   BUN 20 06/21/2019 0337   BUN 75 (H) 12/24/2014 0954   CREATININE 5.86 (H) 06/21/2019 0337   CREATININE 8.81 (H) 12/24/2014 0954   CALCIUM 10.1 06/21/2019 0337   CALCIUM 9.6 05/27/2019 1445   GFRNONAA 8 (L) 06/21/2019 0337   GFRNONAA 6 (L) 12/24/2014 0954   GFRNONAA 3 (L) 08/02/2014 1150   GFRAA 10 (L) 06/21/2019 0337   GFRAA 7 (L) 12/24/2014 0954   GFRAA 4 (L) 08/02/2014 1150     Assessment/Plan: 5 Days Post-Op   Principal Problem:   Closed fracture of right distal tibia Active Problems:   Diabetes ([facility])   Essential hypertension   ESRD on dialysis ([facility])   Mild intellectual disability   Postoperative anemia   - NWB and maintain dressings/splint to RLE  - up with therapy , recommending SNF  - appreciate Renal's help with HD , scheduled for Today 7/27  - appreciate Medicine help with multiple medical morbidities.     - Dispo- SNF when able   Nicholes Stairs 06/22/2019, 10:53 AM    Geralynn Rile, MD [phone]

## 2019-06-22 NOTE — Progress Notes (Signed)
HD orders modified to 06/23/2019 per Dr Royce Macadamia, notified primary RN Arbie Cookey at [phone]

## 2019-06-23 LAB — CBC
HCT: 25.8 % — ABNORMAL LOW (ref 36.0–46.0)
Hemoglobin: 7.8 g/dL — ABNORMAL LOW (ref 12.0–15.0)
MCH: 29.8 pg (ref 26.0–34.0)
MCHC: 30.2 g/dL (ref 30.0–36.0)
MCV: 98.5 fL (ref 80.0–100.0)
Platelets: 263 10*3/uL (ref 150–400)
RBC: 2.62 MIL/uL — ABNORMAL LOW (ref 3.87–5.11)
RDW: 16.7 % — ABNORMAL HIGH (ref 11.5–15.5)
WBC: 5.3 10*3/uL (ref 4.0–10.5)
nRBC: 0 % (ref 0.0–0.2)

## 2019-06-23 LAB — GLUCOSE, CAPILLARY
Glucose-Capillary: 126 mg/dL — ABNORMAL HIGH (ref 70–99)
Glucose-Capillary: 179 mg/dL — ABNORMAL HIGH (ref 70–99)
Glucose-Capillary: 179 mg/dL — ABNORMAL HIGH (ref 70–99)
Glucose-Capillary: 182 mg/dL — ABNORMAL HIGH (ref 70–99)

## 2019-06-23 LAB — RENAL FUNCTION PANEL
Albumin: 2.3 g/dL — ABNORMAL LOW (ref 3.5–5.0)
Anion gap: 16 — ABNORMAL HIGH (ref 5–15)
BUN: 46 mg/dL — ABNORMAL HIGH (ref 6–20)
CO2: 26 mmol/L (ref 22–32)
Calcium: 10.3 mg/dL (ref 8.9–10.3)
Chloride: 100 mmol/L (ref 98–111)
Creatinine, Ser: 9.88 mg/dL — ABNORMAL HIGH (ref 0.44–1.00)
GFR calc Af Amer: 5 mL/min — ABNORMAL LOW (ref >60)
GFR calc non Af Amer: 5 mL/min — ABNORMAL LOW (ref >60)
Glucose, Bld: 127 mg/dL — ABNORMAL HIGH (ref 70–99)
Phosphorus: 3.4 mg/dL (ref 2.5–4.6)
Potassium: 3.7 mmol/L (ref 3.5–5.1)
Sodium: 142 mmol/L (ref 135–145)

## 2019-06-23 MED ORDER — METOPROLOL TARTRATE 25 MG PO TABS
12.5000 mg | ORAL_TABLET | Freq: Two times a day (BID) | ORAL | Status: DC
  Administered 2019-06-23 – 2019-06-25 (×5): 12.5 mg via ORAL
  Filled 2019-06-23 (×5): qty 1

## 2019-06-23 NOTE — Progress Notes (Signed)
[facility] Progress Note   Subjective:  Seen on HD today at 9:04 am.  148/50.  Access working well.  Patient is more alert - states that HD is going ok.     Review of systems:  Denies n/v Denies shortness of breath   Objective Vitals:   06/23/19 0733 06/23/19 0745 06/23/19 0800 06/23/19 0830  BP: (!) 186/71 (!) 175/86 (!) 154/87 (!) 192/63  Pulse: (!) 108 (!) 110 (!) 113 (!) 120  Resp:      Temp:      TempSrc:      SpO2:      Weight:      Height:       Physical Exam General: awakens with exam and adult female in NAD Heart: tachycardia ; no murmur Lungs: CTA anteriorly Abdomen: soft, non-tender Extremities: No LLE edema; R leg wrapped  Dialysis Access: LUE graft in use    Additional Objective Labs: Basic Metabolic Panel: Recent Labs  Lab 06/18/19 2015 06/21/19 0337 06/23/19 0500  NA 137 139 142  K 3.6 3.5 3.7  CL 95* 99 100  CO2 24 28 26   GLUCOSE 173* 269* 127*  BUN 45* 20 46*  CREATININE 11.55* 5.86* 9.88*  CALCIUM 9.9 10.1 10.3  PHOS 4.1  --  3.4   Liver Function Tests: Recent Labs  Lab 06/18/19 2015 06/23/19 0500  ALBUMIN 2.7* 2.3*   CBC: Recent Labs  Lab 06/18/19 2015 06/19/19 0831 06/20/19 0501 06/21/19 0337 06/23/19 0500  WBC 7.3 8.0 7.7 7.0 5.3  HGB 6.1* 8.8* 8.1* 7.9* 7.8*  HCT 19.2* 25.5* 24.9* 24.6* 25.8*  MCV 100.5* 88.5 94.0 94.3 98.5  PLT 169 168 189 198 263   CBG: Recent Labs  Lab 06/22/19 0753 06/22/19 1124 06/22/19 1633 06/22/19 2122 06/23/19 0624  GLUCAP 150* 198* 90 109* 126*   Medications:  . sodium chloride   Intravenous Once  . aspirin  81 mg Oral Daily  . budesonide  0.25 mg Nebulization BID  . Chlorhexidine Gluconate Cloth  6 each Topical Q0600  . cinacalcet  120 mg Oral Q M,W,F-HD  . darbepoetin (ARANESP) injection - DIALYSIS  100 mcg Intravenous Q Fri-HD  . divalproex  1,000 mg Oral QPM  . divalproex  500 mg Oral q morning - 10a  . docusate sodium  100 mg Oral QHS  . escitalopram  15 mg  Oral Daily  . feeding supplement (ENSURE ENLIVE)  237 mL Oral TID BM  . feeding supplement (PRO-STAT SUGAR FREE 64)  30 mL Oral BID  . folic acid  2 mg Oral QHS  . insulin aspart  0-9 Units Subcutaneous TID WC  . lamoTRIgine  150 mg Oral BID  . multivitamin  1 tablet Oral Daily  . pantoprazole  40 mg Oral Daily    Dialysis Orders: MWF-[facility] 3.25hrs, [id number], DFR600, EDW ?,2K/2.5Ca, LUE AVG, Heparin 2000 bolus + 200/hr pump - Sensipar 120mg  qHD - Midodrine 10mg  qHD - Veltassa daily - phoslo 2 tabs bid  Assessment/Plan: 1. 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  Patient seems very sensitive to pain medications/ sedating meds.  2. ESRD: HD is normally per MWF schedule.  She had HD today 7/28 due to inpatient volumes. Plan for next treatment on 7/30 per this modified schedule for now.   3.  Hypokalemia: K acceptable last check; we are holding  outpatient patiromer (K binder) as potassium has been acceptable here  4. Hypertension/volume: HTN with tachycardia. no overt edema on exam. UF with HD.  Started metoprolol 12.5 mg BID.  Note pt normally on midodrine with HD as above.   5. Anemia of CKD: s/p 2U PRBCs 7/23. Continue Aranesp 149mcg q Fri.  No acute indication for PRBC's 6. Secondary Hyperparathyroidism: hypercalcemia, Phos stable. Using low Ca bath, continue sensipar.  Her binders have been held.   7. Nutrition -  Labs acceptable on current diet for now 8.  Bipolar d/o- pt lives in a group home, on multiple meds 9.  DM - per primary   Claudia Desanctis 06/23/2019 8:56 AM

## 2019-06-23 NOTE — Progress Notes (Signed)
Physical Therapy Treatment Patient Details Name: Kelly [name] MRN: [id number] DOB: Jan 17, 1981 Today's Date: 06/23/2019    History of Present Illness 38 yo female with onset of fall on June 18 was sent home with a positioning boot on RLE.  Pt was walking on it there, had displaced R Tib fib fracture with ORIF on 06/17/19.  Pt is a group home resident, and was referred to PT for mobility.  PMHx:  cognitive intellectual disability, bipolar disorder, HTN, DM, HD, EtOH abuse.      PT Comments    Pt performed supine to sit with moderate assistance.  She spent 10 min sitting edge of bed and working toward maintaining posture seated edge of bed.  Pt required mod-max assistance to maintain COG over BOS.  Post balance training edge pf bed patient performed transfer OOB to recliner via stedy with +2 external assistance.  Plan next session for continued balance training to improve core strength and activity tolerance.  Pt continues to benefit from skilled rehab at SNF before returning to group home.      Follow Up Recommendations  SNF;Supervision/Assistance - 24 hour     Equipment Recommendations  Rolling walker with 5" wheels    Recommendations for Other Services       Precautions / Restrictions Precautions Precautions: Fall Precaution Comments: cognitive delays Restrictions Weight Bearing Restrictions: Yes RLE Weight Bearing: Non weight bearing Other Position/Activity Restrictions: Pt unable to maintain NWB so limited to transfer in [place] stedy.    Mobility  Bed Mobility Overal bed mobility: Needs Assistance Bed Mobility: Supine to Sit;Sit to Supine     Supine to sit: Mod assist     General bed mobility comments: Pt required increased time to advance LEs to edge of bed and elevate trunk into sitting.  Pt with posterior bias.  Transfers Overall transfer level: Needs assistance Equipment used: Ambulation equipment used(sara stedy) Transfers: Sit to/from Stand Sit to Stand: Mod  assist;+2 physical assistance;From elevated surface         General transfer comment: Bed elevated to achieve transfer OOB to sara stedy.  Pt placing majority of weight on LLE but unable to keep R foot off the floor.  Pt performed x2 transfers from bed to recliner.  Ambulation/Gait                 Stairs             Wheelchair Mobility    Modified Rankin (Stroke Patients Only)       Balance Overall balance assessment: Needs assistance Sitting-balance support: Bilateral upper extremity supported;Feet unsupported Sitting balance-Leahy Scale: Poor Sitting balance - Comments: needed mod A - max assistance with sitting.  Constant cues for forward weight shifting. Performed sitting balance edge  of bed . 10 min patient fatigues as activity progresses. Postural control: Posterior lean   Standing balance-Leahy Scale: Zero Standing balance comment: Heavy reliance on external assistance.                            Cognition Arousal/Alertness: Lethargic Behavior During Therapy: Flat affect Overall Cognitive Status: No family/caregiver present to determine baseline cognitive functioning                                 General Comments: Pt alert but has a difficult time maintaining focus to task.      Exercises  General Comments        Pertinent Vitals/Pain Pain Assessment: Faces Faces Pain Scale: Hurts even more Pain Location: R distal LE Pain Descriptors / Indicators: Sharp Pain Intervention(s): Monitored during session;Repositioned    Home Living                      Prior Function            PT Goals (current goals can now be found in the care plan section) Acute Rehab PT Goals Patient Stated Goal: pt stated that she wanted to get up Potential to Achieve Goals: Fair Progress towards PT goals: Progressing toward goals    Frequency    Min 3X/week      PT Plan Current plan remains appropriate     Co-evaluation              AM-PAC PT "6 Clicks" Mobility   Outcome Measure  Help needed turning from your back to your side while in a flat bed without using bedrails?: A Lot Help needed moving from lying on your back to sitting on the side of a flat bed without using bedrails?: A Lot Help needed moving to and from a bed to a chair (including a wheelchair)?: Total Help needed standing up from a chair using your arms (e.g., wheelchair or bedside chair)?: Total Help needed to walk in hospital room?: Total Help needed climbing 3-5 steps with a railing? : Total 6 Click Score: 8    End of Session   Activity Tolerance: Patient limited by lethargy Patient left: in chair;with call bell/phone within reach;with chair alarm set Nurse Communication: Mobility status PT Visit Diagnosis: Muscle weakness (generalized) (M62.81);Pain Pain - Right/Left: Right Pain - part of body: Leg     Time: 1525-1549 PT Time Calculation (min) (ACUTE ONLY): 24 min  Charges:  $Therapeutic Activity: 8-22 mins $Neuromuscular Re-education: 8-22 mins                     Governor Rooks, PTA Acute Rehabilitation Services Pager [phone] Office [phone]     Annalis Kaczmarczyk Eli Hose 06/23/2019, 4:01 PM

## 2019-06-23 NOTE — Plan of Care (Signed)
?  Problem: Education: ?Goal: Knowledge of the prescribed therapeutic regimen will improve ?Outcome: Progressing ?  ?Problem: Activity: ?Goal: Ability to increase mobility will improve ?Outcome: Progressing ?  ?Problem: Pain Management: ?Goal: Pain level will decrease with appropriate interventions ?Outcome: Progressing ?  ?Problem: Skin Integrity: ?Goal: Will show signs of wound healing ?Outcome: Progressing ?  ?

## 2019-06-23 NOTE — Progress Notes (Signed)
Patient is off the unit to dialysis.

## 2019-06-23 NOTE — Progress Notes (Signed)
[facility]  PROGRESS NOTE/consultation follow up   [name] [id number]:[id number] DOB: [birth year] DOA: 06/17/2019 PCP: Lorelee Market, MD  Brief History    Kelly [name] is a 38 y.o. year old female with medical history significant for ESRD on MWF dialysis, DM, HTN, BPD, intellectual disability, alcohol abuse who presented from her group home on 06/17/2019 with right ankle pain following a fall that occurred in June.  Patient at that time was placed in a splint with instructions to follow-up with orthopedics.  She failed to follow-up and was not seen until about a month after the injury at which time she was noted to have shortened distal tibia fracture with some early healing noted of the fibula in a malunited position.  She was admitted to orthopedic service and underwent ORIF on 7/22.   Hospital service was consulted on 7/23 for evaluation management of postoperative anemia with hemoglobin of 6.1.  Patient baseline hemoglobin is 8 setting of anemia of chronic disease related to ESRD.  During hospital stay patient has had no reported hematochezia, hematemesis  A & P    Closed fracture right distal tibia, status post IM nailing on 7/22  Managed per primary (Ortho), PT to evaluate  PRN Tylenol    Acute on chronic anemia, stable  Acutely worsened in setting of acute blood loss anemia related to recent surgery.    Status post 2 units of blood on 7/23 for hgb < 7. Currently 7.8 (baseline prior was 7-8), no current need for transfusion  hgb back to baseline,  monitor daily  Remains asymptomatic.  No signs or symptoms of other bleeding diathesis.  Nephrology started Aranesp    Sirs criteria. , resolved  Fever of 100.9 on 7/20 has remained afebrile since, still slightly tachycardic in the 110s without leukocytosis  no localizing signs or symptoms of infection  Continue encourage use of incentive spirometry to avoid postoperative atelectasis  If febrile again obtain  blood cultures  Reported alcohol abuse history, will closely monitor on CIWA in case this is withdrawal   Sinus tachycardia, slightly improved  Confirmed on EKG  Heart rate range 102-120 last 24 hours, could be related to diminished oral intake as patient does look a bit dry on examination.  Continue to encourage oral intake (patient does not like much of hospital food).  Plan for HD today.  Started on metoprolol 12.5mg  BID   Bipolar disorder.    continue home Depakote, Lexapro, Lamictal   Type 2 diabetes, well controlled, glucose very labile  A1c 6.3,  FBGs ranging 115-269  Continue sliding scale for now as she has labile appetite, continue holding Lantus  Nutrition consulted given diminished oral intake/appetite    ESRD on HD  Nephrology following, continue Monday, Wednesday, Friday sessions per nephrology  Continue multivitamin  Continue Sensipar, holding binders for elevated calcium   GERD, stable continue Protonix   COPD, stable  Continue Flovent, budesonide, albuterol as needed   Hypertension, slightly elevated with SBP in the 140s-150s,   continue to closely monitor given prior history of hypotension requiring discontinuation of prior meds in previous hospital stay  May have some improvement after HD session today,started metoprolol 12.5 mg BID    Intellectual disability  Per chart review it seems patient sometimes decides not to speak  Today she is alert, oriented to place time context and much more conversant than usual with me  Very sensitive to pain medication have had stable mental status after discontinuation of narcotics and gabapentin  DVT prophylaxis: Holding while evaluating anemia Code Status: Full Family Communication: No family at bedside    [facility] Direct contact: see [email or web address] (further directions at bottom of note if needed) 7PM-7AM contact night coverage as at bottom of note 06/23/2019, 5:00 PM  LOS: 6  days     Procedures  . 7/22, intramedullary nail to right tibia, by Dr. Victorino December  Antibiotics  . none  Interval History/Subjective  No acute complaints.  Working with PT  Objective   Vitals:  Vitals:   06/23/19 1438 06/23/19 1440  BP: (!) 83/43 (!) 141/71  Pulse: (!) 107 (!) 102  Resp: 17   Temp: 98.5 F (36.9 C)   SpO2: 100%     Exam:  Awake, alert, oriented to place, time, context,minimally conversant with me, slow speech, no appreciable deficits [place].AT,  Symmetrical Chest wall movement, Good air movement bilaterally, CTAB Tachycardic,+ SEM,, No Parasternal Heave +ve B.Sounds, Abd Soft, No tenderness, No organomegaly appriciated, No rebound - guarding or rigidity. No Cyanosis, Clubbing or edema, No new Rash or bruise     I have personally reviewed the following:   Data Reviewed: Basic Metabolic Panel: Recent Labs  Lab 06/17/19 1058 06/18/19 2015 06/21/19 0337 06/23/19 0500  NA 140 137 139 142  K 3.2* 3.6 3.5 3.7  CL 98 95* 99 100  CO2 29 24 28 26   GLUCOSE 110* 173* 269* 127*  BUN 36* 45* 20 46*  CREATININE 9.08* 11.55* 5.86* 9.88*  CALCIUM 10.4* 9.9 10.1 10.3  PHOS  --  4.1  --  3.4   Liver Function Tests: Recent Labs  Lab 06/18/19 2015 06/23/19 0500  ALBUMIN 2.7* 2.3*   No results for input(s): LIPASE, AMYLASE in the last 168 hours. No results for input(s): AMMONIA in the last 168 hours. CBC: Recent Labs  Lab 06/18/19 2015 06/19/19 0831 06/20/19 0501 06/21/19 0337 06/23/19 0500  WBC 7.3 8.0 7.7 7.0 5.3  HGB 6.1* 8.8* 8.1* 7.9* 7.8*  HCT 19.2* 25.5* 24.9* 24.6* 25.8*  MCV 100.5* 88.5 94.0 94.3 98.5  PLT 169 168 189 198 263   Cardiac Enzymes: No results for input(s): CKTOTAL, CKMB, CKMBINDEX, TROPONINI in the last 168 hours. BNP (last 3 results) No results for input(s): BNP in the last 8760 hours.  ProBNP (last 3 results) No results for input(s): PROBNP in the last 8760 hours.  CBG: Recent Labs  Lab 06/22/19 1633 06/22/19  2122 06/23/19 0624 06/23/19 1239 06/23/19 1620  GLUCAP 90 109* 126* 179* 179*    Recent Results (from the past 240 hour(s))  SARS Coronavirus 2 (Performed in [place] hospital lab)     Status: None   Collection Time: 06/16/19 11:05 AM   Specimen: Nasal Swab  Result Value Ref Range Status   SARS Coronavirus 2 NEGATIVE NEGATIVE Final    Comment: (NOTE) SARS-CoV-2 target nucleic acids are NOT DETECTED. The SARS-CoV-2 RNA is generally detectable in upper and lower respiratory specimens during the acute phase of infection. Negative results do not preclude SARS-CoV-2 infection, do not rule out co-infections with other pathogens, and should not be used as the sole basis for treatment or other patient management decisions. Negative results must be combined with clinical observations, patient history, and epidemiological information. The expected result is Negative. Fact Sheet for Patients: SugarRoll.be Fact Sheet for Healthcare Providers: https://www.woods-mathews.com/ This test is not yet approved or cleared by the [place] FDA and  has been authorized for detection and/or diagnosis of SARS-CoV-2 by FDA under an  Emergency Use Authorization (EUA). This EUA will remain  in effect (meaning this test can be used) for the duration of the COVID-19 declaration under Section 56 4(b)(1) of the Act, 21 U.S.C. section 360bbb-3(b)(1), unless the authorization is terminated or revoked sooner. Performed at [facility], [place] [address] [place], [place] [address]   MRSA PCR Screening     Status: None   Collection Time: 06/18/19  5:07 PM   Specimen: Nasal Mucosa; Nasopharyngeal  Result Value Ref Range Status   MRSA by PCR NEGATIVE NEGATIVE Final    Comment:        The GeneXpert MRSA Assay (FDA approved for NASAL specimens only), is one component of a comprehensive MRSA colonization surveillance program. It is not intended to diagnose  MRSA infection nor to guide or monitor treatment for MRSA infections. Performed at [facility], [place] [address], [place], [place] [address]      Studies: No results found.  Scheduled Meds: . sodium chloride   Intravenous Once  . aspirin  81 mg Oral Daily  . budesonide  0.25 mg Nebulization BID  . Chlorhexidine Gluconate Cloth  6 each Topical Q0600  . cinacalcet  120 mg Oral Q M,W,F-HD  . darbepoetin (ARANESP) injection - DIALYSIS  100 mcg Intravenous Q Fri-HD  . divalproex  1,000 mg Oral QPM  . divalproex  500 mg Oral q morning - 10a  . docusate sodium  100 mg Oral QHS  . escitalopram  15 mg Oral Daily  . feeding supplement (ENSURE ENLIVE)  237 mL Oral TID BM  . feeding supplement (PRO-STAT SUGAR FREE 64)  30 mL Oral BID  . folic acid  2 mg Oral QHS  . insulin aspart  0-9 Units Subcutaneous TID WC  . lamoTRIgine  150 mg Oral BID  . metoprolol tartrate  12.5 mg Oral BID  . multivitamin  1 tablet Oral Daily  . pantoprazole  40 mg Oral Daily   Continuous Infusions:   Principal Problem:   Closed fracture of right distal tibia Active Problems:   Diabetes ([place])   Essential hypertension   ESRD on dialysis ([place])   Mild intellectual disability   Postoperative anemia   Sinus tachycardia      Desiree Hane  [facility]

## 2019-06-23 NOTE — Plan of Care (Signed)

## 2019-06-24 LAB — GLUCOSE, CAPILLARY
Glucose-Capillary: 151 mg/dL — ABNORMAL HIGH (ref 70–99)
Glucose-Capillary: 122 mg/dL — ABNORMAL HIGH (ref 70–99)
Glucose-Capillary: 268 mg/dL — ABNORMAL HIGH (ref 70–99)
Glucose-Capillary: 81 mg/dL (ref 70–99)

## 2019-06-24 LAB — CBC
HCT: 28 % — ABNORMAL LOW (ref 36.0–46.0)
Hemoglobin: 8.8 g/dL — ABNORMAL LOW (ref 12.0–15.0)
MCH: 30.2 pg (ref 26.0–34.0)
MCHC: 31.4 g/dL (ref 30.0–36.0)
MCV: 96.2 fL (ref 80.0–100.0)
Platelets: 273 10*3/uL (ref 150–400)
RBC: 2.91 MIL/uL — ABNORMAL LOW (ref 3.87–5.11)
RDW: 16.5 % — ABNORMAL HIGH (ref 11.5–15.5)
WBC: 6.3 10*3/uL (ref 4.0–10.5)
nRBC: 0.3 % — ABNORMAL HIGH (ref 0.0–0.2)

## 2019-06-24 LAB — BASIC METABOLIC PANEL WITH GFR
Anion gap: 13 (ref 5–15)
BUN: 24 mg/dL — ABNORMAL HIGH (ref 6–20)
CO2: 26 mmol/L (ref 22–32)
Calcium: 10.6 mg/dL — ABNORMAL HIGH (ref 8.9–10.3)
Chloride: 99 mmol/L (ref 98–111)
Creatinine, Ser: 5.82 mg/dL — ABNORMAL HIGH (ref 0.44–1.00)
GFR calc Af Amer: 10 mL/min — ABNORMAL LOW
GFR calc non Af Amer: 9 mL/min — ABNORMAL LOW
Glucose, Bld: 178 mg/dL — ABNORMAL HIGH (ref 70–99)
Potassium: 4 mmol/L (ref 3.5–5.1)
Sodium: 138 mmol/L (ref 135–145)

## 2019-06-24 LAB — MAGNESIUM: Magnesium: 2.2 mg/dL (ref 1.7–2.4)

## 2019-06-24 LAB — PHOSPHORUS: Phosphorus: 2.7 mg/dL (ref 2.5–4.6)

## 2019-06-24 MED ORDER — CHLORHEXIDINE GLUCONATE CLOTH 2 % EX PADS
6.0000 | MEDICATED_PAD | Freq: Every day | CUTANEOUS | Status: DC
  Administered 2019-06-24: 6 via TOPICAL

## 2019-06-24 NOTE — Progress Notes (Signed)
[facility] Progress Note   Subjective:   Patient seen in room. Awake but gives short answers. Denies SOB, cough, dizziness,CP, palpitations, abdominal pain, N/V/D. Leg pain is well controlled. Denies any issues with dialysis yesterday.  Objective Vitals:   06/23/19 2239 06/24/19 0434 06/24/19 0756 06/24/19 0905  BP: 127/75 (!) 147/62  136/90  Pulse: (!) 103 100  (!) 112  Resp:  20  18  Temp:  98.7 F (37.1 C)  99.5 F (37.5 C)  TempSrc:  Oral  Oral  SpO2:  99% 98% 99%  Weight:      Height:       Physical Exam General: Well developed female, awake and in NAD Heart: Slightly tachycardic. Regular rhythm. No murmurs appreciated Lungs: CTA anteriorly without wheezing, rhonchi or rales Abdomen:  Soft, non-tender, non-distended. + BS Extremities: R leg wrapped to mid thigh. No edema LLE Dialysis Access: LUE AVG + bruit  Additional Objective Labs: Basic Metabolic Panel: Recent Labs  Lab 06/18/19 2015 06/21/19 0337 06/23/19 0500 06/24/19 0430  NA 137 139 142 138  K 3.6 3.5 3.7 4.0  CL 95* 99 100 99  CO2 24 28 26 26   GLUCOSE 173* 269* 127* 178*  BUN 45* 20 46* 24*  CREATININE 11.55* 5.86* 9.88* 5.82*  CALCIUM 9.9 10.1 10.3 10.6*  PHOS 4.1  --  3.4 2.7   Liver Function Tests: Recent Labs  Lab 06/18/19 2015 06/23/19 0500  ALBUMIN 2.7* 2.3*   CBC: Recent Labs  Lab 06/19/19 0831 06/20/19 0501 06/21/19 0337 06/23/19 0500 06/24/19 0430  WBC 8.0 7.7 7.0 5.3 6.3  HGB 8.8* 8.1* 7.9* 7.8* 8.8*  HCT 25.5* 24.9* 24.6* 25.8* 28.0*  MCV 88.5 94.0 94.3 98.5 96.2  PLT 168 189 198 263 273   Blood Culture    Component Value Date/Time   SDES BLOOD ARTERIAL LINE 06/15/2016 1620   SDES BLOOD DRAWN BY DIALYSIS 06/15/2016 1620   SPECREQUEST  06/15/2016 1620    BOTTLES DRAWN AEROBIC AND ANAEROBIC  AER 7CC ANA 5CC   SPECREQUEST BOTTLES DRAWN AEROBIC AND ANAEROBIC  5CC 06/15/2016 1620   CULT NO GROWTH 5 DAYS 06/15/2016 1620   CULT NO GROWTH 5 DAYS 06/15/2016  1620   REPTSTATUS 06/20/2016 FINAL 06/15/2016 1620   REPTSTATUS 06/20/2016 FINAL 06/15/2016 1620    CBG: Recent Labs  Lab 06/23/19 0624 06/23/19 1239 06/23/19 1620 06/23/19 2131 06/24/19 0809  GLUCAP 126* 179* 179* 182* 151*   Medications:  . sodium chloride   Intravenous Once  . aspirin  81 mg Oral Daily  . budesonide  0.25 mg Nebulization BID  . Chlorhexidine Gluconate Cloth  6 each Topical Q0600  . cinacalcet  120 mg Oral Q M,W,F-HD  . darbepoetin (ARANESP) injection - DIALYSIS  100 mcg Intravenous Q Fri-HD  . divalproex  1,000 mg Oral QPM  . divalproex  500 mg Oral q morning - 10a  . docusate sodium  100 mg Oral QHS  . escitalopram  15 mg Oral Daily  . feeding supplement (ENSURE ENLIVE)  237 mL Oral TID BM  . feeding supplement (PRO-STAT SUGAR FREE 64)  30 mL Oral BID  . folic acid  2 mg Oral QHS  . insulin aspart  0-9 Units Subcutaneous TID WC  . lamoTRIgine  150 mg Oral BID  . metoprolol tartrate  12.5 mg Oral BID  . multivitamin  1 tablet Oral Daily  . pantoprazole  40 mg Oral Daily    Dialysis Orders: [facility] 3.25hrs, [id number],  [id number], EDW ?,2K/2.5Ca, LUE AVG, Heparin 2000 bolus + 200/hr pump - Sensipar 120mg  qHD - Midodrine 10mg  qHD - Veltassa daily - phoslo 2 tabs bid  Assessment/Plan: 1. 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 Noted narcotics and gabapentin discontinued and narcan given on 7/23. Patient reportedly very sensitive to pain meds/sedating meds.  2. ESRD: Completed HD on 7/28 due to high patient load, currently off schedule. Continue TTS schedule for now. K+ 4.0. Holding veltassa as K+ has been acceptable here.  3. HTN/volume:   BP controlled. Does not appear volume overloaded on exam. UFG 3L with HD tomorrow. Also started on metoprolol 12.5 mg BID. Note patient was on midodrine with HD as outpatient.  4. Anemia:  S/p 2 units  PRBC on 7/23. Continue aranesp 100 mcg q Friday. Hemoglobin improved to 8.8. 5. Secondary hyperparathyroidism:  hypercalcemia, Phos stable. Using low Ca bath, continue sensipar.  Not on VDRA. Her binders have been held.   6. Nutrition:  Continue current diet and pro-stat supplement.  7. Bipolar disorder: Lives in a group home. Management per primary team. 8. DM: on insulin, management per primary team.    Anice Paganini, PA-C 06/24/2019, 9:26 AM  [facility] Pager: [phone]

## 2019-06-24 NOTE — NC FL2 (Addendum)
[place] LEVEL OF CARE SCREENING TOOL     IDENTIFICATION  Patient Name: Kelly [name] Birthdate: [birth year] Sex: female Admission Date (Current Location): 06/17/2019  [facility] and [place] Number:  Herbalist and Address:  The [place]. [facility], [place] [address], [place], [place] [address]      Provider Number: [id number]  Attending Physician Name and Address:  Nicholes Stairs, MD  Relative Name and Phone Number:  [name]    Current Level of Care: Domiciliary (Rest home) Recommended Level of Care: Other (Comment)(Group Home) Prior Approval Number:    Date Approved/Denied:   PASRR Number:  Discharge Plan: Domiciliary (Rest home)(Group Home)    Current Diagnoses: Patient Active Problem List   Diagnosis Date Noted  . Sinus tachycardia 06/22/2019  . Postoperative anemia 06/18/2019  . Closed fracture of right distal tibia 06/17/2019  . Weakness generalized 05/24/2019  . Influenza A 12/22/2018  . Hypotension 12/22/2018  . Complication from renal dialysis device 11/06/2018  . Multiple fractures of both lower extremities 06/18/2017  . 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 Hyperkalemia 06/25/2016  . Adjustment disorder with mixed disturbance of emotions and conduct   . Diabetes ([place]) 04/23/2015  . Essential hypertension 04/23/2015  . ESRD on dialysis ([place]) 04/23/2015  . Pain of left arm 04/22/2015    Orientation RESPIRATION BLADDER Height & Weight     Self, Time, Situation, Place  Normal Continent Weight: 181 lb 10.5 oz (82.4 kg) Height:  5\' 1"  (154.9 cm)  BEHAVIORAL SYMPTOMS/MOOD NEUROLOGICAL BOWEL NUTRITION STATUS      Continent Diet(see discharge summary)  AMBULATORY STATUS COMMUNICATION OF NEEDS Skin   Extensive Assist Verbally Other (Comment), Skin abrasions(left leg abrasion)                       Personal Care Assistance Level of Assistance  Bathing, Dressing, Total care, Feeding Bathing  Assistance: Limited assistance Feeding assistance: Independent Dressing Assistance: Limited assistance Total Care Assistance: Maximum assistance   Functional Limitations Info  Sight, Speech, Hearing Sight Info: Adequate Hearing Info: Adequate Speech Info: Adequate    SPECIAL CARE FACTORS FREQUENCY                       Contractures Contractures Info: Not present    Additional Factors Info  Code Status, Allergies Code Status Info: Full Allergies Info: No Known Allergies           Current Medications (06/24/2019):  This is the current hospital active medication list Current Facility-Administered Medications  Medication Dose Route Frequency Provider Last Rate Last Dose  . 0.9 %  sodium chloride infusion (Manually program via Guardrails IV Fluids)   Intravenous Once Etta Quill, DO      . acetaminophen (TYLENOL) tablet 325-650 mg  325-650 mg Oral Q6H PRN Nicholes Stairs, MD   650 mg at 06/24/19 0532  . albuterol (PROVENTIL) (2.5 MG/3ML) 0.083% nebulizer solution 3 mL  3 mL Inhalation Q4H PRN Nicholes Stairs, MD      . aspirin chewable tablet 81 mg  81 mg Oral Daily Nicholes Stairs, MD   81 mg at 06/24/19 0830  . budesonide (PULMICORT) nebulizer solution 0.25 mg  0.25 mg Nebulization BID Nicholes Stairs, MD   0.25 mg at 06/24/19 0755  . Chlorhexidine Gluconate Cloth 2 % PADS 6 each  6 each Topical Q0600 Penninger, [place], [place]   6 each at 06/24/19 0533  .  Chlorhexidine Gluconate Cloth 2 % PADS 6 each  6 each Topical Q0600 Janalee Dane, PA-C   6 each at 06/24/19 1254  . cinacalcet (SENSIPAR) tablet 120 mg  120 mg Oral Q M,W,F-HD Penninger, Lindsay, PA   120 mg at 06/22/19 1242  . Darbepoetin Alfa (ARANESP) injection 100 mcg  100 mcg Intravenous Q Fri-HD Loren Racer, PA-C   100 mcg at 06/19/19 1724  . divalproex (DEPAKOTE ER) 24 hr tablet 1,000 mg  1,000 mg Oral QPM Nicholes Stairs, MD   1,000 mg at 06/23/19 1702  . divalproex  (DEPAKOTE) DR tablet 500 mg  500 mg Oral q morning - 10a Nicholes Stairs, MD   500 mg at 06/24/19 0830  . docusate sodium (COLACE) capsule 100 mg  100 mg Oral QHS Nicholes Stairs, MD   100 mg at 06/23/19 2239  . escitalopram (LEXAPRO) tablet 15 mg  15 mg Oral Daily Nicholes Stairs, MD   15 mg at 06/24/19 0829  . feeding supplement (ENSURE ENLIVE) (ENSURE ENLIVE) liquid 237 mL  237 mL Oral TID BM Nicholes Stairs, MD   237 mL at 06/24/19 1422  . feeding supplement (PRO-STAT SUGAR FREE 64) liquid 30 mL  30 mL Oral BID Nicholes Stairs, MD   30 mL at 06/24/19 0829  . folic acid (FOLVITE) tablet 2 mg  2 mg Oral QHS Nicholes Stairs, MD   2 mg at 06/23/19 2240  . insulin aspart (novoLOG) injection 0-9 Units  0-9 Units Subcutaneous TID WC Etta Quill, DO   1 Units at 06/24/19 1254  . lamoTRIgine (LAMICTAL) tablet 150 mg  150 mg Oral BID Nicholes Stairs, MD   150 mg at 06/24/19 0829  . metoCLOPramide (REGLAN) tablet 5-10 mg  5-10 mg Oral Q8H PRN Nicholes Stairs, MD       Or  . metoCLOPramide [facility]) injection 5-10 mg  5-10 mg Intravenous Q8H PRN Nicholes Stairs, MD      . metoprolol tartrate (LOPRESSOR) tablet 12.5 mg  12.5 mg Oral BID Claudia Desanctis, MD   12.5 mg at 06/24/19 0830  . multivitamin (RENA-VIT) tablet 1 tablet  1 tablet Oral Daily Nicholes Stairs, MD   1 tablet at 06/24/19 [phone]  . ondansetron (ZOFRAN) tablet 4 mg  4 mg Oral Q6H PRN Nicholes Stairs, MD       Or  . ondansetron [facility]) injection 4 mg  4 mg Intravenous Q6H PRN Nicholes Stairs, MD      . pantoprazole (PROTONIX) EC tablet 40 mg  40 mg Oral Daily Nicholes Stairs, MD   40 mg at 06/24/19 0830     Discharge Medications: Please see discharge summary for a list of discharge medications.  Relevant Imaging Results:  Relevant Lab Results:   Additional Information SSN: [id number]  Alberteen Sam, LCSW

## 2019-06-24 NOTE — TOC Progression Note (Addendum)
Transition of Care [facility]) - Progression Note    Patient Details  Name: Kelly [name] MRN: [id number] Date of Birth: 1981/10/04  Transition of Care [facility]) CM/SW [place], [place] Phone Number: [phone] 06/24/2019, 4:05 PM  Clinical Narrative:     CSW consulted with patient's Group Home owner Horris Latino regarding discharge planning. She reports patient has 24/7 assistance at group home, requesting dc summary and fl2 be faxed to [phone] . Horris Latino requests patient be discharge back to Group Home tomorrow 7/30, MD made aware. Patient set up with [facility] for [facility] [place] PT.   Expected Discharge Plan: Group Home Barriers to Discharge: Continued Medical Work up  Expected Discharge Plan and Services Expected Discharge Plan: Group Home     Post Acute Care Choice: (Group Home) Living arrangements for the past 2 months: Group Home Expected Discharge Date: 06/24/19                                     Social Determinants of Health (SDOH) Interventions    Readmission Risk Interventions No flowsheet data found.

## 2019-06-24 NOTE — Plan of Care (Signed)
  Problem: Education: Goal: Knowledge of the prescribed therapeutic regimen will improve Outcome: Progressing   Problem: Activity: Goal: Ability to increase mobility will improve Outcome: Progressing   Problem: Pain Management: Goal: Pain level will decrease with appropriate interventions Outcome: Progressing   Problem: Activity: Goal: Risk for activity intolerance will decrease Outcome: Progressing   Problem: Pain Managment: Goal: General experience of comfort will improve Outcome: Progressing   Problem: Safety: Goal: Ability to remain free from injury will improve Outcome: Progressing

## 2019-06-24 NOTE — Progress Notes (Signed)
   Subjective:  Patient very talkative today and feeling herself.  Ready to go home.  No complaints.    Objective:   VITALS:   Vitals:   06/23/19 2239 06/24/19 0434 06/24/19 0756 06/24/19 0905  BP: 127/75 (!) 147/62  136/90  Pulse: (!) 103 100  (!) 112  Resp:  20  18  Temp:  98.7 F (37.1 C)  99.5 F (37.5 C)  TempSrc:  Oral  Oral  SpO2:  99% 98% 99%  Weight:      Height:        Incision: dressing C/D/I No cellulitis present Compartment soft cap refill < 2 s Endorses SILT dp/sp, intact dorsi and plantar flexion of toes  Lab Results  Component Value Date   WBC 6.3 06/24/2019   HGB 8.8 (L) 06/24/2019   HCT 28.0 (L) 06/24/2019   MCV 96.2 06/24/2019   PLT 273 06/24/2019   BMET    Component Value Date/Time   NA 138 06/24/2019 0430   NA 135 (L) 12/24/2014 0954   K 4.0 06/24/2019 0430   K 6.3 (H) 12/24/2014 0954   CL 99 06/24/2019 0430   CL 100 12/24/2014 0954   CO2 26 06/24/2019 0430   CO2 28 12/24/2014 0954   GLUCOSE 178 (H) 06/24/2019 0430   GLUCOSE 138 (H) 12/24/2014 0954   BUN 24 (H) 06/24/2019 0430   BUN 75 (H) 12/24/2014 0954   CREATININE 5.82 (H) 06/24/2019 0430   CREATININE 8.81 (H) 12/24/2014 0954   CALCIUM 10.6 (H) 06/24/2019 0430   CALCIUM 9.6 05/27/2019 1445   GFRNONAA 9 (L) 06/24/2019 0430   GFRNONAA 6 (L) 12/24/2014 0954   GFRNONAA 3 (L) 08/02/2014 1150   GFRAA 10 (L) 06/24/2019 0430   GFRAA 7 (L) 12/24/2014 0954   GFRAA 4 (L) 08/02/2014 1150     Assessment/Plan: 7 Days Post-Op   Principal Problem:   Closed fracture of right distal tibia Active Problems:   Diabetes (HCC)   Essential hypertension   ESRD on dialysis (HCC)   Mild intellectual disability   Postoperative anemia   Sinus tachycardia   - NWB and maintain dressings/splint to RLE   - appreciate Renal's help with HD , scheduled for Today 7/27  - appreciate Medicine help with multiple medical morbidities.     - Dispo- back to group home where she has 24/7 assistance.   D/c home today  Nicholes Stairs 06/24/2019, 3:00 PM   Geralynn Rile, MD [phone]

## 2019-06-24 NOTE — Plan of Care (Signed)
  Problem: Pain Management: Goal: Pain level will decrease with appropriate interventions Outcome: Progressing   Problem: Skin Integrity: Goal: Will show signs of wound healing 06/24/2019 0028 by Verdia Kuba, RN Outcome: Progressing 06/24/2019 0028 by Verdia Kuba, RN Outcome: Progressing   Problem: Activity: Goal: Risk for activity intolerance will decrease 06/24/2019 0028 by Verdia Kuba, RN Outcome: Progressing 06/24/2019 0028 by Verdia Kuba, RN Outcome: Progressing   Problem: Safety: Goal: Ability to remain free from injury will improve 06/24/2019 0028 by Verdia Kuba, RN Outcome: Progressing 06/24/2019 0028 by Verdia Kuba, RN Outcome: Progressing   Problem: Skin Integrity: Goal: Risk for impaired skin integrity will decrease 06/24/2019 0028 by Verdia Kuba, RN Outcome: Progressing 06/24/2019 0028 by Verdia Kuba, RN Outcome: Progressing

## 2019-06-24 NOTE — NC FL2 (Deleted)
[place] LEVEL OF CARE SCREENING TOOL     IDENTIFICATION  Patient Name: Kelly [name] Birthdate: Jul 16, 1981 Sex: female Admission Date (Current Location): 06/17/2019  [facility] and [place] Number:  Herbalist and Address:  The [place]. [facility], [place] [address], [place], [place] [address]      Provider Number: [id number]  Attending Physician Name and Address:  Nicholes Stairs, MD  Relative Name and Phone Number:  [name]    Current Level of Care: Hospital Recommended Level of Care: [place] Prior Approval Number:    Date Approved/Denied:   PASRR Number: (under review)  Discharge Plan: SNF    Current Diagnoses: Patient Active Problem List   Diagnosis Date Noted  . Sinus tachycardia 06/22/2019  . Postoperative anemia 06/18/2019  . Closed fracture of right distal tibia 06/17/2019  . Weakness generalized 05/24/2019  . Influenza A 12/22/2018  . Hypotension 12/22/2018  . Complication from renal dialysis device 11/06/2018  . Multiple fractures of both lower extremities 06/18/2017  . 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 Hyperkalemia 06/25/2016  . Adjustment disorder with mixed disturbance of emotions and conduct   . Diabetes ([place]) 04/23/2015  . Essential hypertension 04/23/2015  . ESRD on dialysis ([place]) 04/23/2015  . Pain of left arm 04/22/2015    Orientation RESPIRATION BLADDER Height & Weight     Self, Time, Situation, Place  Normal Continent Weight: 181 lb 10.5 oz (82.4 kg) Height:  5\' 1"  (154.9 cm)  BEHAVIORAL SYMPTOMS/MOOD NEUROLOGICAL BOWEL NUTRITION STATUS      Continent Diet(see discharge summary)  AMBULATORY STATUS COMMUNICATION OF NEEDS Skin   Extensive Assist Verbally Other (Comment), Skin abrasions(left leg abrasion)                       Personal Care Assistance Level of Assistance  Bathing, Dressing, Total care, Feeding Bathing Assistance: Limited assistance Feeding  assistance: Independent Dressing Assistance: Limited assistance Total Care Assistance: Maximum assistance   Functional Limitations Info  Sight, Speech, Hearing Sight Info: Adequate Hearing Info: Adequate Speech Info: Adequate    SPECIAL CARE FACTORS FREQUENCY  PT (By licensed PT), OT (By licensed OT)     PT Frequency: min 5x weekly OT Frequency: min 5x weekly            Contractures Contractures Info: Not present    Additional Factors Info  Code Status, Allergies Code Status Info: Full Allergies Info: No Known Allergies           Current Medications (06/24/2019):  This is the current hospital active medication list Current Facility-Administered Medications  Medication Dose Route Frequency Provider Last Rate Last Dose  . 0.9 %  sodium chloride infusion (Manually program via Guardrails IV Fluids)   Intravenous Once Etta Quill, DO      . acetaminophen (TYLENOL) tablet 325-650 mg  325-650 mg Oral Q6H PRN Nicholes Stairs, MD   650 mg at 06/24/19 0532  . albuterol (PROVENTIL) (2.5 MG/3ML) 0.083% nebulizer solution 3 mL  3 mL Inhalation Q4H PRN Nicholes Stairs, MD      . aspirin chewable tablet 81 mg  81 mg Oral Daily Nicholes Stairs, MD   81 mg at 06/24/19 0830  . budesonide (PULMICORT) nebulizer solution 0.25 mg  0.25 mg Nebulization BID Nicholes Stairs, MD   0.25 mg at 06/24/19 0755  . Chlorhexidine Gluconate Cloth 2 % PADS 6 each  6 each Topical Q0600 Penninger, Ria Comment,  PA   6 each at 06/24/19 0533  . Chlorhexidine Gluconate Cloth 2 % PADS 6 each  6 each Topical Q0600 Janalee Dane, PA-C   6 each at 06/24/19 1254  . cinacalcet (SENSIPAR) tablet 120 mg  120 mg Oral Q M,W,F-HD Penninger, [place], PA   120 mg at 06/22/19 1242  . Darbepoetin Alfa (ARANESP) injection 100 mcg  100 mcg Intravenous Q Fri-HD Loren Racer, PA-C   100 mcg at 06/19/19 1724  . divalproex (DEPAKOTE ER) 24 hr tablet 1,000 mg  1,000 mg Oral QPM Nicholes Stairs,  MD   1,000 mg at 06/23/19 1702  . divalproex (DEPAKOTE) DR tablet 500 mg  500 mg Oral q morning - 10a Nicholes Stairs, MD   500 mg at 06/24/19 0830  . docusate sodium (COLACE) capsule 100 mg  100 mg Oral QHS Nicholes Stairs, MD   100 mg at 06/23/19 2239  . escitalopram (LEXAPRO) tablet 15 mg  15 mg Oral Daily Nicholes Stairs, MD   15 mg at 06/24/19 0829  . feeding supplement (ENSURE ENLIVE) (ENSURE ENLIVE) liquid 237 mL  237 mL Oral TID BM Nicholes Stairs, MD   237 mL at 06/24/19 1422  . feeding supplement (PRO-STAT SUGAR FREE 64) liquid 30 mL  30 mL Oral BID Nicholes Stairs, MD   30 mL at 06/24/19 0829  . folic acid (FOLVITE) tablet 2 mg  2 mg Oral QHS Nicholes Stairs, MD   2 mg at 06/23/19 2240  . insulin aspart (novoLOG) injection 0-9 Units  0-9 Units Subcutaneous TID WC Etta Quill, DO   1 Units at 06/24/19 1254  . lamoTRIgine (LAMICTAL) tablet 150 mg  150 mg Oral BID Nicholes Stairs, MD   150 mg at 06/24/19 0829  . metoCLOPramide (REGLAN) tablet 5-10 mg  5-10 mg Oral Q8H PRN Nicholes Stairs, MD       Or  . metoCLOPramide [facility]) injection 5-10 mg  5-10 mg Intravenous Q8H PRN Nicholes Stairs, MD      . metoprolol tartrate (LOPRESSOR) tablet 12.5 mg  12.5 mg Oral BID Claudia Desanctis, MD   12.5 mg at 06/24/19 0830  . multivitamin (RENA-VIT) tablet 1 tablet  1 tablet Oral Daily Nicholes Stairs, MD   1 tablet at 06/24/19 [phone]  . ondansetron (ZOFRAN) tablet 4 mg  4 mg Oral Q6H PRN Nicholes Stairs, MD       Or  . ondansetron [facility]) injection 4 mg  4 mg Intravenous Q6H PRN Nicholes Stairs, MD      . pantoprazole (PROTONIX) EC tablet 40 mg  40 mg Oral Daily Nicholes Stairs, MD   40 mg at 06/24/19 0830     Discharge Medications: Please see discharge summary for a list of discharge medications.  Relevant Imaging Results:  Relevant Lab Results:   Additional Information SSN: [id number]  Alberteen Sam,  LCSW

## 2019-06-24 NOTE — Progress Notes (Signed)
Physical Therapy Treatment Patient Details Name: Kelly [name] MRN: [id number] DOB: 1981/01/20 Today's Date: 06/24/2019    History of Present Illness 38 yo female with onset of fall on June 18 was sent home with a positioning boot on RLE.  Pt was walking on it there, had displaced R Tib fib fracture with ORIF on 06/17/19.  Pt is a group home resident, and was referred to PT for mobility.  PMHx:  cognitive intellectual disability, bipolar disorder, HTN, DM, HD, EtOH abuse.      PT Comments    Pt performed bed mobility and transfers with mod to max assistance and required +2 assistance to stand.  She presents with TDWB on R and unable to maintain R NWB.  Pt positioned in recliner chair to sit up for lunch.  Plan for SNF placement remains appropriate based on progress and level of assistance.  [name] due to her cognitive behavior.     Follow Up Recommendations  SNF;Supervision/Assistance - 24 hour     Equipment Recommendations  Rolling walker with 5" wheels    Recommendations for Other Services       Precautions / Restrictions Precautions Precautions: Fall Precaution Comments: cognitive delays Restrictions Weight Bearing Restrictions: Yes RLE Weight Bearing: Non weight bearing Other Position/Activity Restrictions: Pt unable to maintain NWB so limited to transfer in [place] stedy.    Mobility  Bed Mobility Overal bed mobility: Needs Assistance Bed Mobility: Supine to Sit;Sit to Supine     Supine to sit: Max assist     General bed mobility comments: Pt continues to require assistance to advance LEs to edge of bed and to elevate trunk into sitting.  [name] posteriorly and required cues for weight shifting forward and hand placement to support sitting.  Transfers Overall transfer level: Needs assistance Equipment used: Ambulation equipment used(sara stedy) Transfers: Sit to/from Stand Sit to Stand: Mod assist;+2 physical assistance          General transfer comment: nable to achieve full standing.  Pt performed partial sit to stand on LLE with enough hip clearance to place sara stedy plates.  She fights against facilitory movements.  Ambulation/Gait Ambulation/Gait assistance: (NT unable to maintain weight bearing status)               Stairs             Wheelchair Mobility    Modified Rankin (Stroke Patients Only)       Balance     Sitting balance-Leahy Scale: Poor Sitting balance - Comments: pushes posteriorly     Standing balance-Leahy Scale: Zero Standing balance comment: Heavy reliance on external assistance.                            Cognition Arousal/Alertness: Lethargic(able to wake for therapy but remains easily distracted) Behavior During Therapy: Flat affect Overall Cognitive Status: No family/caregiver present to determine baseline cognitive functioning                                 General Comments: Pt alert but has a difficult time maintaining focus to task.      Exercises General Exercises - Lower Extremity Ankle Circles/Pumps: AROM;Left;10 reps;Supine Quad Sets: (x2 L quad set then unable to perform exercise despite max VCs) Heel Slides: Left;AAROM;PROM;10 reps;Supine Hip ABduction/ADduction: AAROM;PROM;10 reps;Supine;Left Straight Leg Raises: AAROM;Left;10 reps;Supine  General Comments        Pertinent Vitals/Pain Pain Assessment: Faces Faces Pain Scale: Hurts even more Pain Location: R distal LE Pain Descriptors / Indicators: Sharp Pain Intervention(s): Monitored during session;Repositioned    Home Living                      Prior Function            PT Goals (current goals can now be found in the care plan section) Acute Rehab PT Goals Patient Stated Goal: pt stated that she wanted to get up Potential to Achieve Goals: Fair    Frequency    Min 3X/week      PT Plan Current plan remains appropriate     Co-evaluation              AM-PAC PT "6 Clicks" Mobility   Outcome Measure  Help needed turning from your back to your side while in a flat bed without using bedrails?: A Lot Help needed moving from lying on your back to sitting on the side of a flat bed without using bedrails?: A Lot Help needed moving to and from a bed to a chair (including a wheelchair)?: Total Help needed standing up from a chair using your arms (e.g., wheelchair or bedside chair)?: Total Help needed to walk in hospital room?: Total Help needed climbing 3-5 steps with a railing? : Total 6 Click Score: 8    End of Session Equipment Utilized During Treatment: Gait belt Activity Tolerance: Patient limited by lethargy Patient left: in chair;with call bell/phone within reach;with chair alarm set Nurse Communication: Mobility status PT Visit Diagnosis: Muscle weakness (generalized) (M62.81);Pain Pain - Right/Left: Right Pain - part of body: Leg     Time: 8280-0349 PT Time Calculation (min) (ACUTE ONLY): 29 min  Charges:  $Therapeutic Activity: 23-37 mins                     Kelly [name], PTA Acute Rehabilitation Services Pager [phone] Office [phone]     Kelly [name] Kelly [name] 06/24/2019, 2:50 PM

## 2019-06-24 NOTE — Progress Notes (Signed)
PROGRESS NOTE    Kelly [name]  IRC:[id number] DOB: 02-Feb-1981 DOA: 06/17/2019 PCP: Lorelee Market, MD   Brief Narrative:  Kelly [name] is a 38 y.o. year old female with medical history significant for ESRD on MWF dialysis, DM, HTN, BPD, intellectual disability, alcohol abuse who presented from her group home on 06/17/2019 with right ankle pain following a fall that occurred in June.  Patient at that time was placed in a splint with instructions to follow-up with orthopedics.  She failed to follow-up and was not seen until about a month after the injury at which time she was noted to have shortened distal tibia fracture with some early healing noted of the fibula in a malunited position.  She was admitted to orthopedic service and underwent ORIF on 7/22.   Hospital service was consulted on 7/23 for evaluation management of postoperative anemia with hemoglobin of 6.1.  Patient baseline hemoglobin is 8 setting of anemia of chronic disease related to ESRD.  During hospital stay patient has had no reported hematochezia, hematemesis.  **Interim History Patient's hemoglobin/hematocrit is now improving and patient is likely be discharged tomorrow per the primary service.   Assessment & Plan:   Principal Problem:   Closed fracture of right distal tibia Active Problems:   Diabetes ([place])   Essential hypertension   ESRD on dialysis (HCC)   Mild intellectual disability   Postoperative anemia   Sinus tachycardia  Closed fracture right distal tibia, status post IM nailing on 7/22 -Managed per primary (Ortho), PT to evaluate -PRN Tylenol  Acute on chronic anemia of Kidney Disease, stable -Acutely worsened in setting of acute blood loss anemia related to recent surgery.   -Status post 2 units of blood on 7/23 for hgb < 7. Currently 7.8 (baseline prior was 7-8), no current need for transfusion -hgb back to baseline and improving as Hb/Hct was 8.8/28.0; Continue to monitor daily  -Remains asymptomatic.  No signs or symptoms of other bleeding diathesis. -Nephrology started Aranesp   SIRS criteria, resolved -Fever of 100.9 on 7/20 has remained afebrile since, still slightly tachycardic in the 110s without leukocytosis -no localizing signs or symptoms of infection -Continue encourage use of incentive spirometry to avoid postoperative atelectasis -If febrile again obtain blood cultures -Reported alcohol abuse history, will closely monitor on CIWA in case this is withdrawal  Sinus tachycardia, slightly improved -Confirmed on EKG -Heart rate range 94-112 last 12hours, could be related to diminished oral intake as patient does look a bit dry on examination. -Continue to encourage oral intake (patient does not like much of hospital food).  Plan for HD tomorrow . -Started on metoprolol 12.5mg  BID  Bipolar Disorder.   -Continue home Depakote, Lexapro, Lamictal  Type 2 diabetes, well controlled  -Glucose very labile -A1c 6.3, -FBGs ranging 122-268 -Continue sliding scale for now as she has labile appetite, continue holding Lantus -Nutrition consulted given diminished oral intake/appetite   ESRD on HD -Nephrology following, continue Monday, Wednesday, Friday sessions per nephrology and was transitioned to Tuesday Thursday Saturday this week due to inpatient volumes -Continue multivitamin -Continue Cinacalcet 120 mg po MWF -BUN/Cr this AM was 24/5.82 -Per nephrology next dialysis session is on 06/25/2019 -Nephrology they are holding [place] as her K has been acceptable and was 4.0 this morning  Hypercalcemia -Patient calcium level is 10.6 -Likely to be corrected in Dialysis -Continue to Monitor Closely   GERD -Stable continue Protonix  COPD, stable -Continue Flovent, budesonide, albuterol as needed  Hypertension -Improved -Continue to  closely monitor given prior history of hypotension requiring discontinuation of prior meds in previous hospital sta  -Was started metoprolol 12.5 mg BID -C/w Dialysis for Volume Maintenance  Obesity -Estimated body mass index is 34.32 kg/m as calculated from the following:   Height as of this encounter: 5\' 1"  (1.549 m).   Weight as of this encounter: 82.4 kg. -Weight Loss and Dietary Counseling given   Intellectual Disability -Per chart review it seems patient sometimes decides not to speak -Again Today she is alert, oriented to place time context and conversant with me -Very sensitive to pain medication have had stable mental status after discontinuation of narcotics and gabapentin   DVT prophylaxis: SCDs Code Status: FULL CODE Family Communication: No family present at bedside  Disposition Plan: Per Primary Team and Anticipate D/C Home with Home Health PT in AM    Consultants:   Nephrology  [facility]  Orthopedic Surgery (Primary)   Procedures: Dialysis Right tibia open reduction and intramedullary nailing   Closed treatment of left fibular shaft fracture with manipulation, CPT - 16109   Antimicrobials:  Anti-infectives (From admission, onward)   Start     Dose/Rate Route Frequency Ordered Stop   06/17/19 1745  ceFAZolin (ANCEF) IVPB 1 g/50 mL premix     1 g 100 mL/hr over 30 Minutes Intravenous Every 24 hours 06/17/19 1656 06/20/19 1744   06/17/19 1030  ceFAZolin (ANCEF) IVPB 2g/100 mL premix     2 g 200 mL/hr over 30 Minutes Intravenous On call to O.R. 06/17/19 1029 06/17/19 1258     Subjective: Seen and examined at bedside and denies any pain or complaints at this time.  No nausea or vomiting.  States that he slept okay.  No other concerns at this time and was asking when she could go home.  Objective: Vitals:   06/24/19 0434 06/24/19 0756 06/24/19 0905 06/24/19 1700  BP: (!) 147/62  136/90 120/61  Pulse: 100  (!) 112 94  Resp: 20  18   Temp: 98.7 F (37.1 C)  99.5 F (37.5 C) 98.7 F (37.1 C)  TempSrc: Oral  Oral Oral  SpO2: 99% 98% 99% 100%  Weight:      Height:         Intake/Output Summary (Last 24 hours) at 06/24/2019 1810 Last data filed at 06/24/2019 0009 Gross per 24 hour  Intake 100 ml  Output -  Net 100 ml   Filed Weights   06/22/19 1100 06/23/19 0716 06/23/19 1109  Weight: 89.2 kg 85.9 kg 82.4 kg   Examination: Physical Exam:  Constitutional: WN/WD obese AAF in NAD and appears calm and comfortable and has some mental delay  Eyes: Lids and conjunctivae normal, sclerae anicteric  ENMT: External Ears, Nose appear normal. Grossly normal hearing. Mucous membranes are moist.  Neck: Appears normal, supple, no cervical masses, normal ROM, no appreciable thyromegaly Respiratory: Diminished to auscultation bilaterally, no wheezing, rales, rhonchi or crackles. Normal respiratory effort and patient is not tachypenic. No accessory muscle use.  Cardiovascular: Mildly tachycardic, no murmurs / rubs / gallops. S1 and S2 auscultated. No extremity edema. 2+ pedal pulses. No carotid bruits.  Abdomen: Soft, non-tender, Distended 2/2 body habitus. No masses palpated. No appreciable hepatosplenomegaly. Bowel sounds positive x4.  GU: Deferred. Musculoskeletal: Right Leg wrapped and Appears C/D/I Skin: No rashes, lesions, ulcers on a limited skin evaluation. No induration; Warm and dry.  Neurologic: CN 2-12 grossly intact with no focal deficits. Romberg sign and cerebellar reflexes not assessed.  Psychiatric:  Mildly impaired judgment and insight. Alert and oriented x 3. Anxious mood and appropriate affect.   Data Reviewed: I have personally reviewed following labs and imaging studies  CBC: Recent Labs  Lab 06/19/19 0831 06/20/19 0501 06/21/19 0337 06/23/19 0500 06/24/19 0430  WBC 8.0 7.7 7.0 5.3 6.3  HGB 8.8* 8.1* 7.9* 7.8* 8.8*  HCT 25.5* 24.9* 24.6* 25.8* 28.0*  MCV 88.5 94.0 94.3 98.5 96.2  PLT 168 189 198 263 235   Basic Metabolic Panel: Recent Labs  Lab 06/18/19 2015 06/21/19 0337 06/23/19 0500 06/24/19 0430  NA 137 139 142 138  K 3.6  3.5 3.7 4.0  CL 95* 99 100 99  CO2 24 28 26 26   GLUCOSE 173* 269* 127* 178*  BUN 45* 20 46* 24*  CREATININE 11.55* 5.86* 9.88* 5.82*  CALCIUM 9.9 10.1 10.3 10.6*  MG  --   --   --  2.2  PHOS 4.1  --  3.4 2.7   GFR: Estimated Creatinine Clearance: 12.9 mL/min (A) (by C-G formula based on SCr of 5.82 mg/dL (H)). Liver Function Tests: Recent Labs  Lab 06/18/19 2015 06/23/19 0500  ALBUMIN 2.7* 2.3*   No results for input(s): LIPASE, AMYLASE in the last 168 hours. No results for input(s): AMMONIA in the last 168 hours. Coagulation Profile: No results for input(s): INR, PROTIME in the last 168 hours. Cardiac Enzymes: No results for input(s): CKTOTAL, CKMB, CKMBINDEX, TROPONINI in the last 168 hours. BNP (last 3 results) No results for input(s): PROBNP in the last 8760 hours. HbA1C: No results for input(s): HGBA1C in the last 72 hours. CBG: Recent Labs  Lab 06/23/19 1620 06/23/19 2131 06/24/19 0809 06/24/19 1149 06/24/19 1657  GLUCAP 179* 182* 151* 122* 268*   Lipid Profile: No results for input(s): CHOL, HDL, LDLCALC, TRIG, CHOLHDL, LDLDIRECT in the last 72 hours. Thyroid Function Tests: No results for input(s): TSH, T4TOTAL, FREET4, T3FREE, THYROIDAB in the last 72 hours. Anemia Panel: No results for input(s): VITAMINB12, FOLATE, FERRITIN, TIBC, IRON, RETICCTPCT in the last 72 hours. Sepsis Labs: No results for input(s): PROCALCITON, LATICACIDVEN in the last 168 hours.  Recent Results (from the past 240 hour(s))  SARS Coronavirus 2 (Performed in [place] hospital lab)     Status: None   Collection Time: 06/16/19 11:05 AM   Specimen: Nasal Swab  Result Value Ref Range Status   SARS Coronavirus 2 NEGATIVE NEGATIVE Final    Comment: (NOTE) SARS-CoV-2 target nucleic acids are NOT DETECTED. The SARS-CoV-2 RNA is generally detectable in upper and lower respiratory specimens during the acute phase of infection. Negative results do not preclude SARS-CoV-2 infection, do  not rule out co-infections with other pathogens, and should not be used as the sole basis for treatment or other patient management decisions. Negative results must be combined with clinical observations, patient history, and epidemiological information. The expected result is Negative. Fact Sheet for Patients: SugarRoll.be Fact Sheet for Healthcare Providers: https://www.woods-mathews.com/ This test is not yet approved or cleared by the [place] FDA and  has been authorized for detection and/or diagnosis of SARS-CoV-2 by FDA under an Emergency Use Authorization (EUA). This EUA will remain  in effect (meaning this test can be used) for the duration of the COVID-19 declaration under Section 56 4(b)(1) of the Act, 21 U.S.C. section 360bbb-3(b)(1), unless the authorization is terminated or revoked sooner. Performed at [facility], [place] [address] [place], [place] [address]   MRSA PCR Screening     Status: None  Collection Time: 06/18/19  5:07 PM   Specimen: Nasal Mucosa; Nasopharyngeal  Result Value Ref Range Status   MRSA by PCR NEGATIVE NEGATIVE Final    Comment:        The GeneXpert MRSA Assay (FDA approved for NASAL specimens only), is one component of a comprehensive MRSA colonization surveillance program. It is not intended to diagnose MRSA infection nor to guide or monitor treatment for MRSA infections. Performed at [facility], [place] [address], [place], [place] [address]     RN Pressure Injury Documentation:     Estimated body mass index is 34.32 kg/m as calculated from the following:   Height as of this encounter: 5\' 1"  (1.549 m).   Weight as of this encounter: 82.4 kg.  Malnutrition Type:  Nutrition Problem: Increased nutrient needs Etiology: post-op healing, chronic illness(ESRD on HD)   Malnutrition Characteristics:  Signs/Symptoms: estimated needs   Nutrition Interventions:  Interventions:  Ensure Enlive (each supplement provides 350kcal and 20 grams of protein), Prostat, MVI   Radiology Studies: No results found.  Scheduled Meds: . sodium chloride   Intravenous Once  . aspirin  81 mg Oral Daily  . budesonide  0.25 mg Nebulization BID  . Chlorhexidine Gluconate Cloth  6 each Topical Q0600  . Chlorhexidine Gluconate Cloth  6 each Topical Q0600  . cinacalcet  120 mg Oral Q M,W,F-HD  . darbepoetin (ARANESP) injection - DIALYSIS  100 mcg Intravenous Q Fri-HD  . divalproex  1,000 mg Oral QPM  . divalproex  500 mg Oral q morning - 10a  . docusate sodium  100 mg Oral QHS  . escitalopram  15 mg Oral Daily  . feeding supplement (ENSURE ENLIVE)  237 mL Oral TID BM  . feeding supplement (PRO-STAT SUGAR FREE 64)  30 mL Oral BID  . folic acid  2 mg Oral QHS  . insulin aspart  0-9 Units Subcutaneous TID WC  . lamoTRIgine  150 mg Oral BID  . metoprolol tartrate  12.5 mg Oral BID  . multivitamin  1 tablet Oral Daily  . pantoprazole  40 mg Oral Daily   Continuous Infusions:   LOS: 7 days   Kerney Elbe, DO [facility] PAGER is on [place]  If 7PM-7AM, please contact night-coverage [email or web address] Password [email or web address] 06/24/2019, 6:10 PM

## 2019-06-24 NOTE — Discharge Instructions (Signed)
-   no weight bearing to the right leg - elevate the right leg when able  - maintain post op splint until your follow up appointment, DO NOT allow to get wet  - return to see dr. Stann Mainland in 2 weeks  - for pain apply ice to the right ankle and take tylenol as directed over the counter  - for the prevention of blood clots take an 81 mg aspirin once daily for 6 weeks.

## 2019-06-25 LAB — COMPREHENSIVE METABOLIC PANEL
ALT: <5 U/L (ref 0–44)
AST: 11 U/L — ABNORMAL LOW (ref 15–41)
Albumin: 2.3 g/dL — ABNORMAL LOW (ref 3.5–5.0)
Alkaline Phosphatase: 90 U/L (ref 38–126)
Anion gap: 12 (ref 5–15)
BUN: 35 mg/dL — ABNORMAL HIGH (ref 6–20)
CO2: 27 mmol/L (ref 22–32)
Calcium: 10.2 mg/dL (ref 8.9–10.3)
Chloride: 99 mmol/L (ref 98–111)
Creatinine, Ser: 7.21 mg/dL — ABNORMAL HIGH (ref 0.44–1.00)
GFR calc Af Amer: 8 mL/min — ABNORMAL LOW (ref >60)
GFR calc non Af Amer: 7 mL/min — ABNORMAL LOW (ref >60)
Glucose, Bld: 133 mg/dL — ABNORMAL HIGH (ref 70–99)
Potassium: 4 mmol/L (ref 3.5–5.1)
Sodium: 138 mmol/L (ref 135–145)
Total Bilirubin: 0.7 mg/dL (ref 0.3–1.2)
Total Protein: 5.9 g/dL — ABNORMAL LOW (ref 6.5–8.1)

## 2019-06-25 LAB — CBC WITH DIFFERENTIAL/PLATELET
Abs Immature Granulocytes: 0.11 10*3/uL — ABNORMAL HIGH (ref 0.00–0.07)
Basophils Absolute: 0 10*3/uL (ref 0.0–0.1)
Basophils Relative: 1 %
Eosinophils Absolute: 0.2 10*3/uL (ref 0.0–0.5)
Eosinophils Relative: 4 %
HCT: 25.3 % — ABNORMAL LOW (ref 36.0–46.0)
Hemoglobin: 7.7 g/dL — ABNORMAL LOW (ref 12.0–15.0)
Immature Granulocytes: 2 %
Lymphocytes Relative: 37 %
Lymphs Abs: 1.8 10*3/uL (ref 0.7–4.0)
MCH: 30.1 pg (ref 26.0–34.0)
MCHC: 30.4 g/dL (ref 30.0–36.0)
MCV: 98.8 fL (ref 80.0–100.0)
Monocytes Absolute: 0.4 10*3/uL (ref 0.1–1.0)
Monocytes Relative: 8 %
Neutro Abs: 2.3 10*3/uL (ref 1.7–7.7)
Neutrophils Relative %: 48 %
Platelets: 291 10*3/uL (ref 150–400)
RBC: 2.56 MIL/uL — ABNORMAL LOW (ref 3.87–5.11)
RDW: 16.4 % — ABNORMAL HIGH (ref 11.5–15.5)
WBC: 4.8 10*3/uL (ref 4.0–10.5)
nRBC: 0 % (ref 0.0–0.2)

## 2019-06-25 LAB — PHOSPHORUS: Phosphorus: 3.4 mg/dL (ref 2.5–4.6)

## 2019-06-25 LAB — MAGNESIUM: Magnesium: 2.2 mg/dL (ref 1.7–2.4)

## 2019-06-25 LAB — GLUCOSE, CAPILLARY
Glucose-Capillary: 141 mg/dL — ABNORMAL HIGH (ref 70–99)
Glucose-Capillary: 155 mg/dL — ABNORMAL HIGH (ref 70–99)

## 2019-06-25 NOTE — Progress Notes (Addendum)
PROGRESS NOTE    Kelly [name]  QIO:[id number] DOB: 01-31-1981 DOA: 06/17/2019 PCP: Kelly Market, MD   Brief Narrative:  Kelly [name] is a 38 y.o. year old female with medical history significant for ESRD on MWF dialysis, DM, HTN, BPD, intellectual disability, alcohol abuse who presented from her group home on 06/17/2019 with right ankle pain following a fall that occurred in June.  Patient at that time was placed in a splint with instructions to follow-up with orthopedics.  She failed to follow-up and was not seen until about a month after the injury at which time she was noted to have shortened distal tibia fracture with some early healing noted of the fibula in a malunited position.  She was admitted to orthopedic service and underwent ORIF on 7/22.   Hospital service was consulted on 7/23 for evaluation management of postoperative anemia with hemoglobin of 6.1.  Patient baseline hemoglobin is 8 setting of anemia of chronic disease related to ESRD.  During hospital stay patient has had no reported hematochezia, hematemesis.  **Interim History Patient's hemoglobin/hematocrit is now stable and patient is likely be discharged today per the primary service and Agree with D/C with Close PCP follow up.  She underwent hemodialysis today and nephrology recommending holding Veltassa in outpatient and referred to outpatient HD to restart.   Assessment & Plan:   Principal Problem:   Closed fracture of right distal tibia Active Problems:   Diabetes ([place])   Essential hypertension   ESRD on dialysis ([facility])   Mild intellectual disability   Postoperative anemia   Sinus tachycardia  Closed fracture right distal tibia, status post IM nailing on 7/22 -Managed per primary (Ortho), PT to evaluate and recommending skilled nursing facility with supervision and rolling walker with 5 inch wheels but patient wants to go back to her group home -PRN Tylenol 325-650 mg p.o. every 6 as needed for  mild pain for pain score 1-3 -VTE prophylaxis per orthopedic surgery  Acute on Chronic Anemia of Kidney Disease, stable -Acutely worsened in setting of acute blood loss anemia related to recent surgery.   -Status post 2 units of blood on 7/23 for hgb < 7. Baseline prior was 7-8, no current need for transfusion -Hgb back to baseline;  Hb/Hct was 8.8/28.0 yesterday and today was 7.7/25.3; Continue to monitor daily -Remains asymptomatic.  No signs or symptoms of other bleeding diathesis. -Continue to monitor for signs and symptoms of bleeding in the outpatient setting -Nephrology started Aranesp  -Repeat CBC in the outpatient setting and follow-up with dialysis regular scheduled sessions  SIRS criteria, resolved -Fever of 100.9 on 7/20 has remained afebrile since, still slightly tachycardic in the 110s without leukocytosis; patient's WBC is now 4.8 but remains mildly tachycardic and afebrile -no localizing signs or symptoms of infection -Continue encourage use of incentive spirometry to avoid postoperative atelectasis -If febrile again obtain blood cultures but has not spiked any more temperatures -Reported alcohol abuse history, will closely monitor on CIWA in case this is withdrawal  Sinus tachycardia, slightly improved -Confirmed on EKG -Heart rate range 100-115 last 12hours, could be related to diminished oral intake as patient does look a bit dry on examination. -Continue to encourage oral intake (patient does not like much of hospital food).    Underwent hemodialysis today -Started on metoprolol 12.5mg  BID  Bipolar Disorder.   -Continue home Depakote, Lexapro, Lamictal at discharge  Type 2 diabetes, well controlled  -Glucose very labile -A1c 6.3, -FBGs ranging 81-268 -Continue sliding  scale for now as she has labile appetite, continue holding Lantus and resume at discharge now that she is eating better -Nutrition consulted given diminished oral intake/appetite   ESRD on HD  -Nephrology following, continue Monday, Wednesday, Friday sessions per nephrology and was transitioned to Tuesday Thursday Saturday this week due to inpatient volumes -Continue multivitamin -Continue Cinacalcet 120 mg po MWF -BUN/Cr this AM was 35/7.21 -Per nephrology next dialysis session was today 06/25/2019 and per nephrology she can go back to her regular schedule -Nephrology they are holding Veltassa as her K has been acceptable and was 4.0 this morning; nephrology recommends holding Veltassa at discharge  Hypercalcemia -Patient calcium level is 10.2 this morning -Continue to Monitor Closely   GERD -Stable; continue Protonix  COPD, stable -Continue Flovent, budesonide, albuterol as needed at discharge  Hypertension -Improved; was a little elevated this morning but improved after dialysis and was 147/66 -Continue to closely monitor given prior history of hypotension requiring discontinuation of prior meds in previous hospital sta -Was started metoprolol 12.5 mg BID -C/w Dialysis for Volume Maintenance  Obesity -Estimated body mass index is 34.41 kg/m as calculated from the following:   Height as of this encounter: 5\' 1"  (1.549 m).   Weight as of this encounter: 82.6 kg. -Weight Loss and Dietary Counseling given   Intellectual Disability -Per chart review it seems patient sometimes decides not to speak -Again Today she is alert, oriented to place time context and conversant with me -Very sensitive to pain medication have had stable mental status after discontinuation of narcotics and gabapentin   DVT prophylaxis: SCDs Code Status: FULL CODE Family Communication: No family present at bedside  Disposition Plan: Per Primary Team and Anticipate D/C Home with Home Health PT in AM as she does not want to go to SNF  Consultants:   Nephrology  [facility]  Orthopedic Surgery (Primary)   Procedures: Dialysis Right tibia open reduction and intramedullary nailing   Closed  treatment of left fibular shaft fracture with manipulation, CPT - 65784   Antimicrobials:  Anti-infectives (From admission, onward)   Start     Dose/Rate Route Frequency Ordered Stop   06/17/19 1745  ceFAZolin (ANCEF) IVPB 1 g/50 mL premix     1 g 100 mL/hr over 30 Minutes Intravenous Every 24 hours 06/17/19 1656 06/20/19 1744   06/17/19 1030  ceFAZolin (ANCEF) IVPB 2g/100 mL premix     2 g 200 mL/hr over 30 Minutes Intravenous On call to O.R. 06/17/19 1029 06/17/19 1258     Subjective: Seen and examined in the dialysis unit had just finished dialysis.  Denies any abdominal pain and states that she slept well.  States that she did have some mild leg pain but it is tolerable.  No nausea or vomiting.  Wanting to go back to her group home.  No other concerns or complaints at this time.  Objective: Vitals:   06/25/19 0930 06/25/19 1000 06/25/19 1030 06/25/19 1045  BP: (!) 166/85 138/67 (!) 145/83 (!) 147/66  Pulse: (!) 115 (!) 112 (!) 110 (!) 107  Resp: 18 19 20 20   Temp:    98.4 F (36.9 C)  TempSrc:    Oral  SpO2:    98%  Weight:    82.6 kg  Height:        Intake/Output Summary (Last 24 hours) at 06/25/2019 1110 Last data filed at 06/25/2019 1045 Gross per 24 hour  Intake 120 ml  Output 6 ml  Net 114 ml  Filed Weights   06/23/19 1109 06/25/19 0710 06/25/19 1045  Weight: 82.4 kg 82.6 kg 82.6 kg   Examination: Physical Exam:  Constitutional: Well-developed obese African-American female currently no acute distress appears calm and comfortable in the dialysis unit wanting to go home Eyes: Lids and conjunctive are normal.  Sclera anicteric ENMT: External ears nose appear normal.  Grossly normal hearing.  Mucous members are moist Neck: Appears supple no JVD Respiratory: Mildly diminished auscultation bilaterally no appreciable wheezing, rales, rhonchi.  Patient not tachypneic wheezing and accessory muscle breathe Cardiovascular: Patient is tachycardic but no appreciable  murmurs, rubs, gallops.  No appreciable lower extremity edema noted Abdomen: Soft, nontender, distended second body habitus.  Backslash present GU: Deferred Musculoskeletal: Right leg is bandaged Skin: Skin is warm and dry.  No appreciable rashes or lesions on to skin evaluation Neurologic: Cranial nerves II through XII grossly intact no appreciable focal deficits.  Romberg sign cerebellar reflexes were not assessed Psychiatric: Mildly impaired judgment insight.  Patient is awake, alert, oriented.  Not as anxious  Data Reviewed: I have personally reviewed following labs and imaging studies  CBC: Recent Labs  Lab 06/20/19 0501 06/21/19 0337 06/23/19 0500 06/24/19 0430 06/25/19 0500  WBC 7.7 7.0 5.3 6.3 4.8  NEUTROABS  --   --   --   --  2.3  HGB 8.1* 7.9* 7.8* 8.8* 7.7*  HCT 24.9* 24.6* 25.8* 28.0* 25.3*  MCV 94.0 94.3 98.5 96.2 98.8  PLT 189 198 263 273 163   Basic Metabolic Panel: Recent Labs  Lab 06/18/19 2015 06/21/19 0337 06/23/19 0500 06/24/19 0430 06/25/19 0500  NA 137 139 142 138 138  K 3.6 3.5 3.7 4.0 4.0  CL 95* 99 100 99 99  CO2 24 28 26 26 27   GLUCOSE 173* 269* 127* 178* 133*  BUN 45* 20 46* 24* 35*  CREATININE 11.55* 5.86* 9.88* 5.82* 7.21*  CALCIUM 9.9 10.1 10.3 10.6* 10.2  MG  --   --   --  2.2 2.2  PHOS 4.1  --  3.4 2.7 3.4   GFR: Estimated Creatinine Clearance: 10.4 mL/min (A) (by C-G formula based on SCr of 7.21 mg/dL (H)). Liver Function Tests: Recent Labs  Lab 06/18/19 2015 06/23/19 0500 06/25/19 0500  AST  --   --  11*  ALT  --   --  <5  ALKPHOS  --   --  90  BILITOT  --   --  0.7  PROT  --   --  5.9*  ALBUMIN 2.7* 2.3* 2.3*   No results for input(s): LIPASE, AMYLASE in the last 168 hours. No results for input(s): AMMONIA in the last 168 hours. Coagulation Profile: No results for input(s): INR, PROTIME in the last 168 hours. Cardiac Enzymes: No results for input(s): CKTOTAL, CKMB, CKMBINDEX, TROPONINI in the last 168 hours. BNP  (last 3 results) No results for input(s): PROBNP in the last 8760 hours. HbA1C: No results for input(s): HGBA1C in the last 72 hours. CBG: Recent Labs  Lab 06/23/19 2131 06/24/19 0809 06/24/19 1149 06/24/19 1657 06/24/19 2149  GLUCAP 182* 151* 122* 268* 81   Lipid Profile: No results for input(s): CHOL, HDL, LDLCALC, TRIG, CHOLHDL, LDLDIRECT in the last 72 hours. Thyroid Function Tests: No results for input(s): TSH, T4TOTAL, FREET4, T3FREE, THYROIDAB in the last 72 hours. Anemia Panel: No results for input(s): VITAMINB12, FOLATE, FERRITIN, TIBC, IRON, RETICCTPCT in the last 72 hours. Sepsis Labs: No results for input(s): PROCALCITON, LATICACIDVEN in the last 168  hours.  Recent Results (from the past 240 hour(s))  SARS Coronavirus 2 (Performed in [facility] hospital lab)     Status: None   Collection Time: 06/16/19 11:05 AM   Specimen: Nasal Swab  Result Value Ref Range Status   SARS Coronavirus 2 NEGATIVE NEGATIVE Final    Comment: (NOTE) SARS-CoV-2 target nucleic acids are NOT DETECTED. The SARS-CoV-2 RNA is generally detectable in upper and lower respiratory specimens during the acute phase of infection. Negative results do not preclude SARS-CoV-2 infection, do not rule out co-infections with other pathogens, and should not be used as the sole basis for treatment or other patient management decisions. Negative results must be combined with clinical observations, patient history, and epidemiological information. The expected result is Negative. Fact Sheet for Patients: SugarRoll.be Fact Sheet for Healthcare Providers: https://www.woods-mathews.com/ This test is not yet approved or cleared by the [place] FDA and  has been authorized for detection and/or diagnosis of SARS-CoV-2 by FDA under an Emergency Use Authorization (EUA). This EUA will remain  in effect (meaning this test can be used) for the duration of the COVID-19  declaration under Section 56 4(b)(1) of the Act, 21 U.S.C. section 360bbb-3(b)(1), unless the authorization is terminated or revoked sooner. Performed at [facility], [place] [address], [place], [place] [address]   MRSA PCR Screening     Status: None   Collection Time: 06/18/19  5:07 PM   Specimen: Nasal Mucosa; Nasopharyngeal  Result Value Ref Range Status   MRSA by PCR NEGATIVE NEGATIVE Final    Comment:        The GeneXpert MRSA Assay (FDA approved for NASAL specimens only), is one component of a comprehensive MRSA colonization surveillance program. It is not intended to diagnose MRSA infection nor to guide or monitor treatment for MRSA infections. Performed at [facility], [place] [address], [place], [place] [address]     RN Pressure Injury Documentation:     Estimated body mass index is 34.41 kg/m as calculated from the following:   Height as of this encounter: 5\' 1"  (1.549 m).   Weight as of this encounter: 82.6 kg.  Malnutrition Type:  Nutrition Problem: Increased nutrient needs Etiology: post-op healing, chronic illness(ESRD on HD)   Malnutrition Characteristics:  Signs/Symptoms: estimated needs   Nutrition Interventions:  Interventions: Ensure Enlive (each supplement provides 350kcal and 20 grams of protein), Prostat, MVI   Radiology Studies: No results found.  Scheduled Meds: . sodium chloride   Intravenous Once  . aspirin  81 mg Oral Daily  . budesonide  0.25 mg Nebulization BID  . Chlorhexidine Gluconate Cloth  6 each Topical Q0600  . Chlorhexidine Gluconate Cloth  6 each Topical Q0600  . cinacalcet  120 mg Oral Q M,W,F-HD  . darbepoetin (ARANESP) injection - DIALYSIS  100 mcg Intravenous Q Fri-HD  . divalproex  1,000 mg Oral QPM  . divalproex  500 mg Oral q morning - 10a  . docusate sodium  100 mg Oral QHS  . escitalopram  15 mg Oral Daily  . feeding supplement (ENSURE ENLIVE)  237 mL Oral TID BM  . feeding supplement (PRO-STAT  SUGAR FREE 64)  30 mL Oral BID  . folic acid  2 mg Oral QHS  . insulin aspart  0-9 Units Subcutaneous TID WC  . lamoTRIgine  150 mg Oral BID  . metoprolol tartrate  12.5 mg Oral BID  . multivitamin  1 tablet Oral Daily  . pantoprazole  40 mg  Oral Daily   Continuous Infusions:   LOS: 8 days   Kerney Elbe, DO [facility] PAGER is on AMION  If 7PM-7AM, please contact night-coverage [email or web address] Password [email or web address] 06/25/2019, 11:10 AM

## 2019-06-25 NOTE — Plan of Care (Signed)
  Problem: Activity: Goal: Ability to increase mobility will improve Outcome: Progressing   Problem: Physical Regulation: Goal: Postoperative complications will be avoided or minimized Outcome: Progressing   Problem: Pain Management: Goal: Pain level will decrease with appropriate interventions Outcome: Progressing   Problem: Skin Integrity: Goal: Will show signs of wound healing Outcome: Progressing

## 2019-06-25 NOTE — Progress Notes (Signed)
Provided discharge education/instructions to [facility], all questions and concerns addressed, Pt not in distress. Unable to talk to her legal guardian but left message to her voicemail twice regarding pending discharge. Pt discharged to group home with belongings.

## 2019-06-25 NOTE — Progress Notes (Signed)
[facility] Progress Note   Subjective:   Seen on HD. Alert and pleasant this AM. States "I'm feeling good!" Denies SOB, dyspnea, CP, palpitations, abdominal pain, N/V/D. No leg pain this AM. Per hospitalist note, may be discharging today.   Objective Vitals:   06/25/19 0710 06/25/19 0715 06/25/19 0730 06/25/19 0800  BP: (!) 162/82 (!) 182/90 (!) 166/55 (!) 158/88  Pulse: (!) 106 100 (!) 110 (!) 111  Resp: 16 15 14 16   Temp: 99 F (37.2 C)     TempSrc: Oral     SpO2: 97%     Weight: 82.6 kg     Height:       Physical Exam General: Well developed female, awake and in NAD Heart: Slightly tachycardic. Regular rhythm. No murmurs appreciated Lungs: CTA anteriorly without wheezing, rhonchi or rales Abdomen:  Soft, non-tender, non-distended. + BS Extremities: R leg wrapped to mid thigh. No edema LLE Dialysis Access: LUE AVG cannulated   Additional Objective Labs: Basic Metabolic Panel: Recent Labs  Lab 06/23/19 0500 06/24/19 0430 06/25/19 0500  NA 142 138 138  K 3.7 4.0 4.0  CL 100 99 99  CO2 26 26 27   GLUCOSE 127* 178* 133*  BUN 46* 24* 35*  CREATININE 9.88* 5.82* 7.21*  CALCIUM 10.3 10.6* 10.2  PHOS 3.4 2.7 3.4   Liver Function Tests: Recent Labs  Lab 06/18/19 2015 06/23/19 0500 06/25/19 0500  AST  --   --  11*  ALT  --   --  <5  ALKPHOS  --   --  90  BILITOT  --   --  0.7  PROT  --   --  5.9*  ALBUMIN 2.7* 2.3* 2.3*   CBC: Recent Labs  Lab 06/20/19 0501 06/21/19 0337 06/23/19 0500 06/24/19 0430 06/25/19 0500  WBC 7.7 7.0 5.3 6.3 4.8  NEUTROABS  --   --   --   --  2.3  HGB 8.1* 7.9* 7.8* 8.8* 7.7*  HCT 24.9* 24.6* 25.8* 28.0* 25.3*  MCV 94.0 94.3 98.5 96.2 98.8  PLT 189 198 263 273 291   Blood Culture    Component Value Date/Time   SDES BLOOD ARTERIAL LINE 06/15/2016 1620   SDES BLOOD DRAWN BY DIALYSIS 06/15/2016 1620   SPECREQUEST  06/15/2016 1620    BOTTLES DRAWN AEROBIC AND ANAEROBIC  AER 7CC ANA 5CC   SPECREQUEST BOTTLES  DRAWN AEROBIC AND ANAEROBIC  5CC 06/15/2016 1620   CULT NO GROWTH 5 DAYS 06/15/2016 1620   CULT NO GROWTH 5 DAYS 06/15/2016 1620   REPTSTATUS 06/20/2016 FINAL 06/15/2016 1620   REPTSTATUS 06/20/2016 FINAL 06/15/2016 1620    Cardiac Enzymes: No results for input(s): CKTOTAL, CKMB, CKMBINDEX, TROPONINI in the last 168 hours. CBG: Recent Labs  Lab 06/23/19 2131 06/24/19 0809 06/24/19 1149 06/24/19 1657 06/24/19 2149  GLUCAP 182* 151* 122* 268* 81   Medications:  . sodium chloride   Intravenous Once  . aspirin  81 mg Oral Daily  . budesonide  0.25 mg Nebulization BID  . Chlorhexidine Gluconate Cloth  6 each Topical Q0600  . Chlorhexidine Gluconate Cloth  6 each Topical Q0600  . cinacalcet  120 mg Oral Q M,W,F-HD  . darbepoetin (ARANESP) injection - DIALYSIS  100 mcg Intravenous Q Fri-HD  . divalproex  1,000 mg Oral QPM  . divalproex  500 mg Oral q morning - 10a  . docusate sodium  100 mg Oral QHS  . escitalopram  15 mg Oral Daily  . feeding supplement (  ENSURE ENLIVE)  237 mL Oral TID BM  . feeding supplement (PRO-STAT SUGAR FREE 64)  30 mL Oral BID  . folic acid  2 mg Oral QHS  . insulin aspart  0-9 Units Subcutaneous TID WC  . lamoTRIgine  150 mg Oral BID  . metoprolol tartrate  12.5 mg Oral BID  . multivitamin  1 tablet Oral Daily  . pantoprazole  40 mg Oral Daily    Dialysis Orders: MWF-[facility] 3.25hrs, [id number], DFR600, EDW ?,2K/2.5Ca, LUE AVG, Heparin 2000 bolus + 200/hr pump - Sensipar 120mg  qHD - Midodrine 10mg  qHD - Veltassa daily - phoslo 2 tabs bid   Assessment/Plan: 1. 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 Noted narcotics and gabapentin discontinued and narcan given on 7/23. Patient reportedly very sensitive to pain meds/sedating meds.  2. ESRD: Completed HD on 7/28 due to high patient load, currently off schedule. Continue TTS schedule for  now and resume MWF schedule at discharge if discharging today. K+ stable at 4.0. Holding veltassa as K+ has been acceptable here.  3. HTN/volume:   BP controlled. Does not appear volume overloaded on exam. Minimal UF goal with HD today. Also started on metoprolol 12.5 mg BID. Note patient was on midodrine with HD as outpatient but has not needed this here.  4. Anemia:  S/p 2 units PRBC on 7/23. Continue aranesp 100 mcg q Friday. Hemoglobin improved to 7.7, baseline is reportedly 7-8.  5. Secondary hyperparathyroidism:  hypercalcemia improving with Ca 10.2, Phos 3.4. Using low Ca bath, continue sensipar. Not on VDRA. Her binders have been held.  6. Nutrition:  Continue current diet and pro-stat supplement.  7. Bipolar disorder: Lives in a group home. Management per primary team. 8. DM: on insulin, management per primary team.    Anice Paganini, PA-C 06/25/2019, 8:18 AM  [facility] Pager: [phone]

## 2019-06-25 NOTE — Discharge Summary (Signed)
Patient ID: Kelly [name] MRN: [id number] DOB/AGE: 1981-05-22 38 y.o.  Admit date: 06/17/2019 Discharge date:   Primary Diagnosis: Right tibia nonunion  Admission Diagnoses:  Past Medical History:  Diagnosis Date  . Alcohol abuse    chronic  . Anemia   . Bipolar disorder ([place])   . Chronic kidney disease    Dialysis - M/W/F  . Cognitive changes   . Cognitive impairment   . Depression   . Depression   . Diabetes mellitus without complication ([place])   . Elevated lipids   . Hyperparathyroidism ([place])   . Hypertension   . Intellectual disability   . Staph aureus infection    A/V fistula  . UTI (lower urinary tract infection)    Discharge Diagnoses:   Principal Problem:   Closed fracture of right distal tibia Active Problems:   Diabetes ([place])   Essential hypertension   ESRD on dialysis ([place])   Mild intellectual disability   Postoperative anemia   Sinus tachycardia  Estimated body mass index is 34.41 kg/m as calculated from the following:   Height as of this encounter: 5\' 1"  (1.549 m).   Weight as of this encounter: 82.6 kg.  Procedure:  Procedure(s) (LRB): INTRAMEDULLARY (IM) NAIL TIBIAL (Right)   Consults: nephrology and general medicine service  HPI: Kelly [name] presented to my office well over a month out from a right distal tibia fracture.  She was indicated for open reduction and internal fixation of this nonunion. Laboratory Data: No results displayed because visit has over 200 results.    Hospital Outpatient Visit on 06/16/2019  Component Date Value Ref Range Status  . SARS Coronavirus 2 06/16/2019 NEGATIVE  NEGATIVE Final   Comment: (NOTE) SARS-CoV-2 target nucleic acids are NOT DETECTED. The SARS-CoV-2 RNA is generally detectable in upper and lower respiratory specimens during the acute phase of infection. Negative results do not preclude SARS-CoV-2 infection, do not rule out co-infections with other pathogens, and should not be used as the sole basis  for treatment or other patient management decisions. Negative results must be combined with clinical observations, patient history, and epidemiological information. The expected result is Negative. Fact Sheet for Patients: SugarRoll.be Fact Sheet for Healthcare Providers: https://www.woods-mathews.com/ This test is not yet approved or cleared by the [place] FDA and  has been authorized for detection and/or diagnosis of SARS-CoV-2 by FDA under an Emergency Use Authorization (EUA). This EUA will remain  in effect (meaning this test can be used) for the duration of the COVID-19 declaration under Section 56                          4(b)(1) of the Act, 21 U.S.C. section 360bbb-3(b)(1), unless the authorization is terminated or revoked sooner. Performed at [facility], [place] [address], [place], [place] [address]   No results displayed because visit has over 200 results.       X-Rays:Dg Tibia/fibula Right  Result Date: 06/17/2019 CLINICAL DATA:  RIGHT tibial nailing EXAM: DG C-ARM 61-120 MIN; RIGHT TIBIA AND FIBULA - 2 VIEW COMPARISON:  05/20/2019 FLUOROSCOPY TIME:  2 minutes 33 seconds Images submitted: 6 FINDINGS: Images demonstrate presence of an IM nail with proximal and distal locking screws identified within the tibia across a mildly displaced oblique fracture of the distal RIGHT tibial metadiaphysis. Knee and ankle joint alignments normal. Comminuted fracture of the proximal fibular diaphysis is also seen. Bones appear demineralized. IMPRESSION: Post nailing of distal RIGHT tibial  fracture. Comminuted displaced proximal RIGHT fibular diaphyseal fracture. Electronically Signed   By: [name] M.D.   On: 06/17/2019 16:33   Dg C-arm 1-60 Min  Result Date: 06/17/2019 CLINICAL DATA:  RIGHT tibial nailing EXAM: DG C-ARM 61-120 MIN; RIGHT TIBIA AND FIBULA - 2 VIEW COMPARISON:  05/20/2019 FLUOROSCOPY TIME:  2 minutes 33 seconds Images  submitted: 6 FINDINGS: Images demonstrate presence of an IM nail with proximal and distal locking screws identified within the tibia across a mildly displaced oblique fracture of the distal RIGHT tibial metadiaphysis. Knee and ankle joint alignments normal. Comminuted fracture of the proximal fibular diaphysis is also seen. Bones appear demineralized. IMPRESSION: Post nailing of distal RIGHT tibial fracture. Comminuted displaced proximal RIGHT fibular diaphyseal fracture. Electronically Signed   By: [name] M.D.   On: 06/17/2019 16:33    EKG: Orders placed or performed during the hospital encounter of 06/17/19  . EKG 12-Lead  . EKG 12-Lead  . EKG 12-Lead  . EKG 12-Lead     Hospital Course: Kelly [name] is a 38 y.o. who was admitted to Hospital. They were brought to the operating room on 06/17/2019 and underwent Procedure(s): INTRAMEDULLARY (IM) NAIL TIBIAL.  Patient tolerated the procedure well and was later transferred to the recovery room and then to the orthopaedic floor for postoperative care.  They were given PO and IV analgesics for pain control following their surgery.  They were given 24 hours of postoperative antibiotics of  Anti-infectives (From admission, onward)   Start     Dose/Rate Route Frequency Ordered Stop   06/17/19 1745  ceFAZolin (ANCEF) IVPB 1 g/50 mL premix     1 g 100 mL/hr over 30 Minutes Intravenous Every 24 hours 06/17/19 1656 06/20/19 1744   06/17/19 1030  ceFAZolin (ANCEF) IVPB 2g/100 mL premix     2 g 200 mL/hr over 30 Minutes Intravenous On call to O.R. 06/17/19 1029 06/17/19 1258     and started on DVT prophylaxis in the form of Heparin.   On the first evening of surgery the patient was noted to be somewhat sedated.  The nephrology service was on board for her Monday, Wednesday, and Friday hemodialysis.  Some of the pain medications were noted to be not appropriate dosed.  These were cut back and discontinued in some cases.  She did become more alert and  appropriate the floor following these measures.  She did continue with her normally scheduled hemodialysis with no issues.  Please see their notes for full details of any issues or subsequent management changes.     Medially postoperatively she was found to be anemic and below her baseline threshold which was around 8.5.  She was transfused 2 units of packed red blood cells at the request of the medicine service which was consulted for her multiple medical comorbidities.  She was appropriately resuscitated and had no some skin issues.  The medicine service helped manage her insulin as well as home medications while in the hospital.  There were no complications subsequent to her medical comorbidities.   During the course of her stay she was working with physical therapy and remain nonweightbearing to the right lower extremity.  She was given assistive devices to help with mobilization.  She was recommended for skilled nursing facility versus 24 7 assistance at home.  Due to her living environment and 3 day per week hemodialysis schedule it was felt comparable to send her back to her group home with the 24 7  assistance.  This was arranged.  Home health physical therapy was also arranged.  The patient was comfortable and seen on rounds prior to discharge and felt to be appropriate to go back to the group home.  Patient was seen in rounds and was ready to go home.   Diet: Diabetic diet Activity:NWB Follow-up:in 2 weeks Disposition - Boarding Home Discharged Condition: good   Discharge Instructions    Call MD / Call 911   Complete by: As directed    If you experience chest pain or shortness of breath, CALL 911 and be transported to the hospital emergency room.  If you develope a fever above 101 F, pus (white drainage) or increased drainage or redness at the wound, or calf pain, call your surgeon's office.   Constipation Prevention   Complete by: As directed    Drink plenty of fluids.  Prune juice may  be helpful.  You may use a stool softener, such as Colace (over the counter) 100 mg twice a day.  Use MiraLax (over the counter) for constipation as needed.   Diet - low sodium heart healthy   Complete by: As directed    Increase activity slowly as tolerated   Complete by: As directed      Allergies as of 06/25/2019   No Known Allergies     Medication List    TAKE these medications   acetaminophen 325 MG tablet Commonly known as: TYLENOL Take 650 mg by mouth every Monday, Wednesday, and Friday with hemodialysis.   albuterol 108 (90 Base) MCG/ACT inhaler Commonly known as: VENTOLIN HFA Inhale 2 puffs into the lungs every 4 (four) hours as needed for wheezing or shortness of breath.   aspirin 81 MG chewable tablet Chew 1 tablet (81 mg total) by mouth daily.   calcium acetate 667 MG capsule Commonly known as: PHOSLO Take 2 capsules (1,334 mg total) by mouth 2 (two) times daily with a meal.   cetirizine 10 MG tablet Commonly known as: ZYRTEC Take 10 mg by mouth daily.   chlorhexidine 0.12 % solution Commonly known as: PERIDEX Use as directed 15 mLs in the mouth or throat 2 (two) times daily. Rinse for 1 min then spit   cinacalcet 60 MG tablet Commonly known as: SENSIPAR Take 1 tablet (60 mg total) by mouth daily. What changed: how much to take   divalproex 500 MG DR tablet Commonly known as: DEPAKOTE Take 2 tablets (1,000 mg total) by mouth at bedtime. What changed:   how much to take  when to take this  additional instructions   docusate sodium 100 MG capsule Commonly known as: COLACE Take 1 capsule (100 mg total) by mouth at bedtime.   escitalopram 5 MG tablet Commonly known as: LEXAPRO Take 5 mg by mouth daily. Takes along with 10mg  totally 15mg  daily. What changed: Another medication with the same name was changed. Make sure you understand how and when to take each.   escitalopram 10 MG tablet Commonly known as: LEXAPRO Take 1.5 tablets (15 mg total) by  mouth daily. What changed:   how much to take  additional instructions   fluticasone 110 MCG/ACT inhaler Commonly known as: FLOVENT HFA Inhale 2 puffs into the lungs 2 (two) times daily.   folic acid 1 MG tablet Commonly known as: FOLVITE Take 2 tablets (2 mg total) by mouth at bedtime.   Glucagon Emergency 1 MG injection Generic drug: glucagon Inject 1 mg into the vein once as needed (low blood sugar).  insulin aspart 100 UNIT/ML injection Commonly known as: novoLOG Inject 0-15 Units into the skin 3 (three) times daily with meals. Per sliding scale   insulin glargine 100 UNIT/ML injection Commonly known as: LANTUS Inject 0.1 mLs (10 Units total) into the skin at bedtime. What changed: how much to take   [place] 234 MG/1.5ML Susy injection Generic drug: paliperidone Inject 234 mg into the muscle See admin instructions. EVERY 28 DAYS   lamoTRIgine 150 MG tablet Commonly known as: LAMICTAL Take 150 mg by mouth 2 (two) times daily.   lidocaine-prilocaine cream Commonly known as: EMLA Apply 1 application topically 3 (three) times a week. Apply to access site 30 minutes before dialysis. What changed: when to take this   medroxyPROGESTERone 150 MG/ML injection Commonly known as: DEPO-PROVERA Inject 150 mg into the muscle every 3 (three) months.   oxyCODONE-acetaminophen 5-325 MG tablet Commonly known as: PERCOCET/ROXICET Take 1 tablet by mouth every 6 (six) hours as needed for severe pain.   pantoprazole 40 MG tablet Commonly known as: PROTONIX Take 40 mg by mouth daily.   patiromer 8.4 g packet Commonly known as: VELTASSA Take 1 packet (8.4 g total) by mouth daily. What changed:   when to take this  additional instructions   Rena-Vite Rx 1 MG Tabs Take 1 tablet by mouth daily.   Stiolto Respimat 2.5-2.5 MCG/ACT Aers Generic drug: Tiotropium Bromide-Olodaterol Inhale 2 puffs into the lungs daily.   Therems Tabs Take 1 tablet by mouth daily.    traZODone 150 MG tablet Commonly known as: DESYREL Take 150 mg by mouth at bedtime.      Follow-up Information    Nicholes Stairs, MD In 3 weeks.   Specialty: Orthopedic Surgery Why: For suture removal Contact information: [address] [place] [address] [phone]           Signed: Geralynn Rile, MD Orthopaedic Surgery 06/25/2019, 10:21 AM

## 2019-06-25 NOTE — Progress Notes (Signed)
   Subjective:  Patient doing well today/this am.  No complaints.    Objective:   VITALS:   Vitals:   06/25/19 0930 06/25/19 1000 06/25/19 1030 06/25/19 1045  BP: (!) 166/85 138/67 (!) 145/83 (!) 147/66  Pulse: (!) 115 (!) 112 (!) 110 (!) 107  Resp: 18 19 20 20   Temp:    98.4 F (36.9 C)  TempSrc:    Oral  SpO2:    98%  Weight:    82.6 kg  Height:        Incision: dressing C/D/I No cellulitis present Compartment soft cap refill < 2 s Endorses SILT dp/sp, intact dorsi and plantar flexion of toes  Lab Results  Component Value Date   WBC 4.8 06/25/2019   HGB 7.7 (L) 06/25/2019   HCT 25.3 (L) 06/25/2019   MCV 98.8 06/25/2019   PLT 291 06/25/2019   BMET    Component Value Date/Time   NA 138 06/25/2019 0500   NA 135 (L) 12/24/2014 0954   K 4.0 06/25/2019 0500   K 6.3 (H) 12/24/2014 0954   CL 99 06/25/2019 0500   CL 100 12/24/2014 0954   CO2 27 06/25/2019 0500   CO2 28 12/24/2014 0954   GLUCOSE 133 (H) 06/25/2019 0500   GLUCOSE 138 (H) 12/24/2014 0954   BUN 35 (H) 06/25/2019 0500   BUN 75 (H) 12/24/2014 0954   CREATININE 7.21 (H) 06/25/2019 0500   CREATININE 8.81 (H) 12/24/2014 0954   CALCIUM 10.2 06/25/2019 0500   CALCIUM 9.6 05/27/2019 1445   GFRNONAA 7 (L) 06/25/2019 0500   GFRNONAA 6 (L) 12/24/2014 0954   GFRNONAA 3 (L) 08/02/2014 1150   GFRAA 8 (L) 06/25/2019 0500   GFRAA 7 (L) 12/24/2014 0954   GFRAA 4 (L) 08/02/2014 1150     Assessment/Plan: 8 Days Post-Op   Principal Problem:   Closed fracture of right distal tibia Active Problems:   Diabetes ([facility])   Essential hypertension   ESRD on dialysis ([facility])   Mild intellectual disability   Postoperative anemia   Sinus tachycardia   - NWB and maintain dressings/splint to RLE   - appreciate Renal's help with HD   - appreciate Medicine help with multiple medical morbidities.     - Dispo- back to group home where she has 24/7 assistance.  D/c home today, [facility] PT ordered.  Nicholes Stairs  06/25/2019, 11:50 AM   Geralynn Rile, MD [phone]

## 2019-06-25 NOTE — Progress Notes (Signed)
CSW has consulted with patient's group home in which they report they are picking her up at 6:00 pm. They require FL2 and Discharge Summary be given to them upon picking patient up.   CSW has printed FL2 for nurse to give when they arrive at 6pm, nurse will print DC summary once MD completes by 6pm to give to group home as well.   No further needs for discharge today.   [place], [place]

## 2019-06-30 ENCOUNTER — Ambulatory Visit: Payer: Medicaid Other | Admitting: Podiatry

## 2019-07-14 ENCOUNTER — Ambulatory Visit: Payer: Medicaid Other | Admitting: Podiatry

## 2019-08-04 ENCOUNTER — Ambulatory Visit (INDEPENDENT_AMBULATORY_CARE_PROVIDER_SITE_OTHER): Payer: Medicaid Other

## 2019-08-04 ENCOUNTER — Ambulatory Visit: Payer: Medicaid Other | Admitting: Podiatry

## 2019-08-04 ENCOUNTER — Encounter: Payer: Self-pay | Admitting: Podiatry

## 2019-08-04 ENCOUNTER — Ambulatory Visit: Payer: Self-pay

## 2019-08-04 ENCOUNTER — Other Ambulatory Visit: Payer: Self-pay

## 2019-08-04 ENCOUNTER — Other Ambulatory Visit: Payer: Self-pay | Admitting: Podiatry

## 2019-08-04 DIAGNOSIS — L97511 Non-pressure chronic ulcer of other part of right foot limited to breakdown of skin: Secondary | ICD-10-CM | POA: Diagnosis not present

## 2019-08-04 DIAGNOSIS — L02611 Cutaneous abscess of right foot: Secondary | ICD-10-CM

## 2019-08-04 DIAGNOSIS — L97509 Non-pressure chronic ulcer of other part of unspecified foot with unspecified severity: Secondary | ICD-10-CM | POA: Insufficient documentation

## 2019-08-04 DIAGNOSIS — L02619 Cutaneous abscess of unspecified foot: Secondary | ICD-10-CM | POA: Insufficient documentation

## 2019-08-04 MED ORDER — DOXYCYCLINE HYCLATE 100 MG PO TABS: 100.0000 mg | ORAL_TABLET | Freq: Two times a day (BID) | ORAL | 0 refills | Status: DC

## 2019-08-04 NOTE — Progress Notes (Signed)
This patient presents the office with chief complaint of a painful infected big toe on her right foot.  She was sent to this office today by Dr. Brunetta Genera for evaluation and treatment.  She presents the office today with the caretaker from her group home.  He says that she initially was on cephalexin for an infection to the toe but finished the antibiotics 10 days ago.  Since that time the big toe has become painful with an open wound noted with drainage noted at the site of the open wound.  The caretaker states that no x-rays or cultures were taken of her right great toe wound.  Patient does have a history of having ankle surgery in July.  This patient is a diabetic on dialysis.  She presents the office today for an evaluation and treatment of her infected big toe right foot.  General Appearance  Alert but with intellectual disability according to diagnosis.  Vascular  Dorsalis pedis and posterior tibial  pulses are weakly  palpable  bilaterally.  Capillary return is within normal limits  bilaterally. Temperature is within normal limits  bilaterally.  Neurologic  Senn-Weinstein monofilament wire test within normal limits  bilaterally. Muscle power within normal limits bilaterally.  Nails Thick disfigured discolored nails with subungual debris  from hallux to fifth toes bilaterally. No evidence of bacterial infection or drainage bilaterally.  Orthopedic  No limitations of motion  feet .  No crepitus or effusions noted.  Hammer toes  B/l.  Skin 6 mm x 6 mm ulcer noted on the medial aspect of the medial border right great toe.  There is drainage and pus noted at the site of the ulcer.  There is scant drainage noted at the medial aspect of the proximal nail fold coming from the ulcer.  There is swelling and black skin noted at the base of the first second and third digits right foot.  No evidence of any lymphangitis noted.  Diabetic infected ulcer right hallux.    ROV.  Examination of the right big toe  reveals a cellulitis with a ulcer present medial to the medial border of the toenail right hallux.  A culture of the infected diabetic ulcer was performed and to be sent to the lab.  X-ray was taken revealing no evidence of any absence of bone or bony reactivity noted on the medial aspect of the distal phalanx right hallux.  The ulcer was bandaged with Neosporin and a dry sterile dressing.  Home instructions for peroxide washes and bandaging was explained to the caregiver.  Prescribed doxycycline No. 21 twice daily x10 days.  Patient to return to the office in 10 days for further evaluation and treatment   Gardiner Barefoot DPM

## 2019-08-12 ENCOUNTER — Ambulatory Visit: Payer: Medicaid Other | Admitting: Podiatry

## 2019-08-12 ENCOUNTER — Encounter: Payer: Self-pay | Admitting: Podiatry

## 2019-08-12 ENCOUNTER — Telehealth: Payer: Self-pay | Admitting: *Deleted

## 2019-08-12 ENCOUNTER — Other Ambulatory Visit: Payer: Self-pay

## 2019-08-12 DIAGNOSIS — L97511 Non-pressure chronic ulcer of other part of right foot limited to breakdown of skin: Secondary | ICD-10-CM | POA: Diagnosis not present

## 2019-08-12 DIAGNOSIS — L97509 Non-pressure chronic ulcer of other part of unspecified foot with unspecified severity: Secondary | ICD-10-CM

## 2019-08-12 DIAGNOSIS — I739 Peripheral vascular disease, unspecified: Secondary | ICD-10-CM

## 2019-08-12 MED ORDER — GABAPENTIN 100 MG PO CAPS: 100.0000 mg | ORAL_CAPSULE | Freq: Every day | ORAL | 0 refills | Status: DC

## 2019-08-12 NOTE — Telephone Encounter (Signed)
[employer] - MEDICAID AUTHORIZED THE ABI w TBI [phone] OF RIGHT FOOT, AUTHORIZATION:  [id number], EFFECTIVE:  08/12/2019, END:  02/08/2020. Faxed orders to [facility].

## 2019-08-12 NOTE — Telephone Encounter (Signed)
[employer] - Medicaid requires clinicals for review [place] [address] of right foot with and without contrast, Case:  [id number].

## 2019-08-12 NOTE — Progress Notes (Signed)
This patient presents the office for treatment of diabetic ulcer that was infected.  A culture was taken of her toe last visit and revealed skin flora growing from the draining ulcer.  She is now experiencing sharp shooting pains through the toe on occasion.  She is brought to the office by her caregiver for further evaluation and treatment.    General Appearance  Alert but with intellectual disability according to diagnosis.  Vascular  Dorsalis pedis and posterior tibial  pulses are weakly  palpable  bilaterally.  Capillary return is within normal limits  bilaterally. Temperature is within normal limits  bilaterally.  Neurologic  Senn-Weinstein monofilament wire test within normal limits  bilaterally. Muscle power within normal limits bilaterally.  Nails Thick disfigured discolored nails with subungual debris  from hallux to fifth toes bilaterally. No evidence of bacterial infection or drainage bilaterally.  Orthopedic  No limitations of motion  feet .  No crepitus or effusions noted.  Hammer toes  B/l.  Skin 1 x 1 mm.  ulcer noted on the medial aspect of the medial border right great toe.  There is healing at the site of the ulcer.  .  There is blackened callus noted surrounding the central ulcer right hallux..  There is swelling and black skin noted at the base of the first second and third digits right foot.  No evidence of any lymphangitis noted.  Diabetic infected ulcer right hallux.    ROV.  Examination of the right big toe reveals healing noted at the site of the ulcer.  There is no evidence of an infection/cellulitis right hallux.  Dr. Posey Pronto consulted on this patient.  The necrotic tissue was debrided and the toe was bandaged with Betadine dry sterile dressing.  Dr. Posey Pronto recommended she have vascular studies performed and an MRI performed on the right hallux.  Patient to return to the office in 1 week to see Dr. Posey Pronto.  Call the office if this problem worsens.   Gardiner Barefoot DPM

## 2019-08-13 ENCOUNTER — Telehealth: Payer: Self-pay | Admitting: Podiatry

## 2019-08-13 NOTE — Telephone Encounter (Signed)
Unable to leave message mailbox is full.

## 2019-08-13 NOTE — Telephone Encounter (Signed)
[employer] AUTHORIZED CT RIGHT FOOT [phone] CASE: [id number], [id number], EFFECTIVE:  08/12/2019, END:  02/08/2020. Faxed to [facility] Radiology main scheduling.

## 2019-08-13 NOTE — Telephone Encounter (Signed)
Patient was suppose to receive pain medication yesterday

## 2019-08-14 NOTE — Telephone Encounter (Signed)
I spoke with Mr. Kelly [name] and informed that Dr. Posey Pronto did not want to add more gabapentin to pt's medication regimen at this time. Mr. Kelly [name] stated pt only takes the Gabapentin on dialysis days. I told Mr. Kelly [name] to have pt's dialysis doctor refill the gabapentin and I would ask Dr. Posey Pronto for more pain medication.

## 2019-08-14 NOTE — Addendum Note (Signed)
Addended by: Harriett Sine D on: 08/14/2019 08:34 AM   Modules accepted: Orders

## 2019-08-18 ENCOUNTER — Ambulatory Visit (HOSPITAL_COMMUNITY)
Admission: RE | Admit: 2019-08-18 | Payer: Medicaid Other | Source: Ambulatory Visit | Attending: Podiatry | Admitting: Podiatry

## 2019-08-18 ENCOUNTER — Ambulatory Visit: Payer: Medicaid Other | Admitting: Podiatry

## 2019-08-19 ENCOUNTER — Ambulatory Visit (INDEPENDENT_AMBULATORY_CARE_PROVIDER_SITE_OTHER): Payer: Medicaid Other

## 2019-08-19 ENCOUNTER — Other Ambulatory Visit: Payer: Self-pay | Admitting: Podiatry

## 2019-08-19 ENCOUNTER — Ambulatory Visit (HOSPITAL_COMMUNITY)
Admission: RE | Admit: 2019-08-19 | Discharge: 2019-08-19 | Disposition: A | Discharge status: Home / Self Care | Payer: Medicaid Other | Source: Ambulatory Visit | Attending: Cardiovascular Disease | Admitting: Cardiovascular Disease

## 2019-08-19 ENCOUNTER — Ambulatory Visit (INDEPENDENT_AMBULATORY_CARE_PROVIDER_SITE_OTHER): Payer: Medicaid Other | Admitting: Podiatry

## 2019-08-19 ENCOUNTER — Other Ambulatory Visit: Payer: Self-pay

## 2019-08-19 DIAGNOSIS — L97511 Non-pressure chronic ulcer of other part of right foot limited to breakdown of skin: Secondary | ICD-10-CM

## 2019-08-19 DIAGNOSIS — L89600 Pressure ulcer of unspecified heel, unstageable: Secondary | ICD-10-CM | POA: Diagnosis not present

## 2019-08-19 DIAGNOSIS — I739 Peripheral vascular disease, unspecified: Secondary | ICD-10-CM | POA: Insufficient documentation

## 2019-08-19 DIAGNOSIS — M79671 Pain in right foot: Secondary | ICD-10-CM

## 2019-08-19 DIAGNOSIS — L97509 Non-pressure chronic ulcer of other part of unspecified foot with unspecified severity: Secondary | ICD-10-CM | POA: Diagnosis present

## 2019-08-19 DIAGNOSIS — L89890 Pressure ulcer of other site, unstageable: Secondary | ICD-10-CM

## 2019-08-19 NOTE — Progress Notes (Signed)
Subjective:  Patient ID: Kelly [name], female    DOB: February 12, 1981,  MRN: [id number]  Chief Complaint  Patient presents with   Foot Pain    pt is here for a ulcer f/u on the right foot great toe, pt states that the pcp has already prescribed pain meds regarding the pain in her foot, Xrays were hard to obtain    38 y.o. female presents with the above complaint. Hx confirmed with patient. The ulcer has been stable on the right big toe. However, patient another ulceration that occurred due to excess pressure to the lateral aspect of the fifth metatarsal head.   Review of Systems: Negative except as noted in the HPI. Denies N/V/F/Ch.  Past Medical History:  Diagnosis Date   Alcohol abuse    chronic   Anemia    Bipolar disorder ([place])    Chronic kidney disease    Dialysis - M/W/F   Cognitive changes    Cognitive impairment    Depression    Depression    Diabetes mellitus without complication (HCC)    Elevated lipids    Hyperparathyroidism ([place])    Hypertension    Intellectual disability    Staph aureus infection    A/V fistula   UTI (lower urinary tract infection)     Current Outpatient Medications:    acetaminophen (TYLENOL) 325 MG tablet, Take 650 mg by mouth every Monday, Wednesday, and Friday with hemodialysis., Disp: , Rfl:    albuterol (PROVENTIL HFA;VENTOLIN HFA) 108 (90 Base) MCG/ACT inhaler, Inhale 2 puffs into the lungs every 4 (four) hours as needed for wheezing or shortness of breath., Disp: , Rfl:    aspirin 81 MG chewable tablet, Chew 1 tablet (81 mg total) by mouth daily., Disp: 30 tablet, Rfl: 0   B Complex-C-Folic Acid (RENA-VITE RX) 1 MG TABS, Take 1 tablet by mouth daily., Disp: 30 tablet, Rfl: 0   calcium acetate (PHOSLO) 667 MG capsule, Take 2 capsules (1,334 mg total) by mouth 2 (two) times daily with a meal., Disp: 60 capsule, Rfl: 0   cetirizine (ZYRTEC) 10 MG tablet, Take 10 mg by mouth daily., Disp: , Rfl:    chlorhexidine  (PERIDEX) 0.12 % solution, Use as directed 15 mLs in the mouth or throat 2 (two) times daily. Rinse for 1 min then spit, Disp: 120 mL, Rfl: 0   cinacalcet (SENSIPAR) 60 MG tablet, Take 1 tablet (60 mg total) by mouth daily. (Patient taking differently: Take 120 mg by mouth daily. ), Disp: 60 tablet, Rfl: 0   divalproex (DEPAKOTE) 500 MG DR tablet, Take 2 tablets (1,000 mg total) by mouth at bedtime. (Patient taking differently: Take 500-1,000 mg by mouth See admin instructions. 500 mg every morning, and 1000 mg at bedtime), Disp: 60 tablet, Rfl: 0   docusate sodium (COLACE) 100 MG capsule, Take 1 capsule (100 mg total) by mouth at bedtime., Disp: 30 capsule, Rfl: 0   doxycycline (VIBRA-TABS) 100 MG tablet, Take 1 tablet (100 mg total) by mouth 2 (two) times daily., Disp: 20 tablet, Rfl: 0   escitalopram (LEXAPRO) 10 MG tablet, Take 1.5 tablets (15 mg total) by mouth daily. (Patient taking differently: Take 10 mg by mouth daily. Take with 5 mg tablet to equal 15 mg daily), Disp: 45 tablet, Rfl: 1   escitalopram (LEXAPRO) 5 MG tablet, Take 5 mg by mouth daily. Takes along with 10mg  totally 15mg  daily., Disp: , Rfl:    fluticasone (FLOVENT HFA) 110 MCG/ACT inhaler, Inhale 2 puffs  into the lungs 2 (two) times daily., Disp: , Rfl:    folic acid (FOLVITE) 1 MG tablet, Take 2 tablets (2 mg total) by mouth at bedtime., Disp: 30 tablet, Rfl: 0   glucagon (GLUCAGON EMERGENCY) 1 MG injection, Inject 1 mg into the vein once as needed (low blood sugar)., Disp: , Rfl:    hydrocortisone (ANUSOL-HC) 2.5 % rectal cream, APPLY TOPICALLY 3 TIMES DAILY FOR 10 DAYS, Disp: , Rfl:    insulin aspart (NOVOLOG) 100 UNIT/ML injection, Inject 0-15 Units into the skin 3 (three) times daily with meals. Per sliding scale , Disp: , Rfl:    insulin glargine (LANTUS) 100 UNIT/ML injection, Inject 0.1 mLs (10 Units total) into the skin at bedtime. (Patient taking differently: Inject 20 Units into the skin at bedtime. ), Disp:  10 mL, Rfl: 11   lamoTRIgine (LAMICTAL) 150 MG tablet, Take 150 mg by mouth 2 (two) times daily. , Disp: , Rfl:    lidocaine-prilocaine (EMLA) cream, Apply 1 application topically 3 (three) times a week. Apply to access site 30 minutes before dialysis. (Patient taking differently: Apply 1 application topically every Monday, Wednesday, and Friday. Apply to access site 30 minutes before dialysis. ), Disp: 30 g, Rfl: 0   medroxyPROGESTERone (DEPO-PROVERA) 150 MG/ML injection, Inject 150 mg into the muscle every 3 (three) months., Disp: , Rfl:    Multiple Vitamin (THEREMS) TABS, Take 1 tablet by mouth daily., Disp: 30 tablet, Rfl: 0   paliperidone (INVEGA SUSTENNA) 234 MG/1.5ML SUSY injection, Inject 234 mg into the muscle See admin instructions. EVERY 28 DAYS , Disp: , Rfl:    pantoprazole (PROTONIX) 40 MG tablet, Take 40 mg by mouth daily., Disp: , Rfl:    patiromer (VELTASSA) 8.4 g packet, Take 1 packet (8.4 g total) by mouth daily. (Patient taking differently: Take 8.4 g by mouth daily. Mixed into 1/3 cup of drink), Disp: 30 packet, Rfl: 0   Tiotropium Bromide-Olodaterol (STIOLTO RESPIMAT) 2.5-2.5 MCG/ACT AERS, Inhale 2 puffs into the lungs daily., Disp: , Rfl:    traZODone (DESYREL) 150 MG tablet, Take 150 mg by mouth at bedtime., Disp: , Rfl:   Social History   Tobacco Use  Smoking Status Current Every Day Smoker   Packs/day: 0.25   Years: 0.00   Pack years: 0.00   Types: Cigarettes  Smokeless Tobacco Current User    No Known Allergies Objective:  There were no vitals filed for this visit. There is no height or weight on file to calculate BMI. Constitutional no acute distress and alert/oriented x3  Vascular Dorsalis pedis pulse: absent Posterior tibial pulse: absent and  Hair pattern: hair loss up to ankle Warmth: no warmth Cap refill diminished  Neurologic Epicritic sensations intact  Dermatologic Skin normal and ulceration at Right hallux measuring 1 x 1 cm with  serous drainage, no purulence/maldoor present. No clinical signs of infection noted. No fluctuance noted.  Ulcer #2: Lateral aspect of the fifth metatarsal head pressure ulcer unstageable. No malodor present. No fluctuance noted.    Orthopedic: Swelling: none and minimal Tenderness: mild to the right foot     Radiographs: Right foot xray 2 views noted. No osteomyelitis noted. There is no soft tissue emphysema noted. No vessel calcification Assessment:   1. Foot pain, right   2. Skin ulcer of right great toe, limited to breakdown of skin ([place])   3. Pressure injury of right foot, unstageable ([place])    Plan:  A 38 y.o. female presents with Right foot dorsal  hallux ulceration down to muscle noted and new developing pressure ulceration on the right lateral fifth metatarsal noted (unstagable).  -Patient was educated in the offloading the fifth metatarsal head pressure ulcer. Betadine wet to dry dressings were applied to it -Patient will obtain ABI/PVR today and CT w/ contrast on October first. I will see her on October 2nd.  Patient was evaluated and treated and all questions answered. Ulcer Right hallux wound medial/dorsal aspect -Debridement as below. -Dressed with betadine, DSD.  Procedure: Excisional Debridement of Wound Rationale: Removal of non-viable soft tissue from the wound to promote healing.  Anesthesia: none Pre-Debridement Wound Measurements: 1 cm x 1 cm x 0.3 cm  Post-Debridement Wound Measurements: 0.9 cm x 0.9 cm x 0.3 cm  Type of Debridement: Sharp Excisional Tissue Removed: Non-viable soft tissue Depth of Debridement: subcutaneous tissue. Technique: Sharp excisional debridement to bleeding, viable wound base.  Dressing: Dry, sterile, compression dressing. Disposition: Patient tolerated procedure well. Patient to return in 1 week for follow-up.  Return in about 1 week (around 08/26/2019).

## 2019-08-20 ENCOUNTER — Telehealth: Payer: Self-pay | Admitting: *Deleted

## 2019-08-20 ENCOUNTER — Encounter (HOSPITAL_COMMUNITY): Payer: Medicaid Other

## 2019-08-20 DIAGNOSIS — R0989 Other specified symptoms and signs involving the circulatory and respiratory systems: Secondary | ICD-10-CM

## 2019-08-20 DIAGNOSIS — M79671 Pain in right foot: Secondary | ICD-10-CM

## 2019-08-20 DIAGNOSIS — I739 Peripheral vascular disease, unspecified: Secondary | ICD-10-CM

## 2019-08-20 NOTE — Telephone Encounter (Signed)
-----   Message from Felipa Furnace, DPM sent at 08/20/2019  8:38 AM EDT ----- Regarding: ABI/PVR were abnormal Would you be able to set up this patient to go see any vascular surgeon? Ideally before her follow up with me on October 2nd.    Thanks  Boneta Lucks

## 2019-08-20 NOTE — Telephone Encounter (Signed)
[facility] just called and states pt is in a care facility and would like the number to call. I informed of Kelly [name]'s number [phone] that has previously called for pt.

## 2019-08-20 NOTE — Telephone Encounter (Signed)
Faxed referral, clinicals and demographics to [facility]. 

## 2019-08-21 ENCOUNTER — Telehealth: Payer: Self-pay | Admitting: *Deleted

## 2019-08-21 DIAGNOSIS — L02611 Cutaneous abscess of right foot: Secondary | ICD-10-CM

## 2019-08-21 DIAGNOSIS — L97511 Non-pressure chronic ulcer of other part of right foot limited to breakdown of skin: Secondary | ICD-10-CM

## 2019-08-21 NOTE — Telephone Encounter (Signed)
Need orders changed to CT either with or without. Faxed orders to [facility] for CT right foot with.

## 2019-08-26 ENCOUNTER — Ambulatory Visit (HOSPITAL_COMMUNITY)
Admission: RE | Admit: 2019-08-26 | Discharge: 2019-08-26 | Disposition: A | Discharge status: Home / Self Care | Payer: Medicaid Other | Source: Ambulatory Visit | Attending: Cardiovascular Disease | Admitting: Cardiovascular Disease

## 2019-08-26 ENCOUNTER — Other Ambulatory Visit: Payer: Self-pay

## 2019-08-26 ENCOUNTER — Ambulatory Visit (INDEPENDENT_AMBULATORY_CARE_PROVIDER_SITE_OTHER): Payer: Medicaid Other | Admitting: Cardiovascular Disease

## 2019-08-26 ENCOUNTER — Encounter: Payer: Self-pay | Admitting: Cardiovascular Disease

## 2019-08-26 DIAGNOSIS — I739 Peripheral vascular disease, unspecified: Secondary | ICD-10-CM

## 2019-08-26 DIAGNOSIS — L97509 Non-pressure chronic ulcer of other part of unspecified foot with unspecified severity: Secondary | ICD-10-CM | POA: Diagnosis not present

## 2019-08-26 DIAGNOSIS — L97511 Non-pressure chronic ulcer of other part of right foot limited to breakdown of skin: Secondary | ICD-10-CM | POA: Insufficient documentation

## 2019-08-26 NOTE — Assessment & Plan Note (Signed)
Kelly [name] returns for follow-up.  I last saw her 02/18/2018 for Dr. Jacqualyn Posey because of a callus on her left posterior heel.  She did not have critical limb ischemia at that time.  She recently had surgery on her right leg with with a broken ankle and a rod placed in July of this year.  She now has what appears to be critical limb ischemia on her right great toe with recent Dopplers that show incompressible vessels, occluded posterior tibial and peroneal artery with a patent AT.  She is wheelchair-bound bound.  She is a renal dialysis patient.  She is followed by Dr. Duane Boston and Dr. Posey Pronto.  It is unclear to me whether she would benefit from angiography and endovascular therapy.  I will talk to Dr. Alessandra Grout to further define a treatment strategy.

## 2019-08-26 NOTE — Progress Notes (Signed)
08/26/2019 Kelly [name]   11-Mar-1981  [id number]  Primary Physician Lorelee Market, MD Primary Cardiologist: Lorretta Harp MD FACP, [place], [place], [place]  HPI:  Kelly [name] is a 38 y.o.   mildly overweight single African-American female with no children who has moderate cognitive deficits elicited group home. She was referred by Dr. Earleen Newport, podiatry, for evaluation of abnormal ABIs. She does have a history of treated hypertension and diabetes with a long history of chronic renal insufficiency on hemodialysis. There is a question of a heart attack in the past. She was seen by Dr. Earleen Newport 02/04/18 for follow-up of a callus on her left posterior heel. She has no history or evidence of critical limb ischemia. Dopplers revealed normal ABIs were triphasic waveforms with abnormal ABIs typical in a diabetic.  Since I saw her a year and half ago she did break her right ankle requiring orthopedic surgery with a rod in her right leg.  She developed a right great toe ulcer and was seen by Dr. Prudence Davidson and Dr. Posey Pronto at [facility].  Recent Dopplers revealed noncompressible vessels with occluded right PT and peroneal arteries.  Current Meds  Medication Sig  . acetaminophen (TYLENOL) 325 MG tablet Take 650 mg by mouth every Monday, Wednesday, and Friday with hemodialysis.  Marland Kitchen albuterol (PROVENTIL HFA;VENTOLIN HFA) 108 (90 Base) MCG/ACT inhaler Inhale 2 puffs into the lungs every 4 (four) hours as needed for wheezing or shortness of breath.  Marland Kitchen aspirin 81 MG chewable tablet Chew 1 tablet (81 mg total) by mouth daily.  . B Complex-C-Folic Acid (RENA-VITE RX) 1 MG TABS Take 1 tablet by mouth daily.  . calcium acetate (PHOSLO) 667 MG capsule Take 2 capsules (1,334 mg total) by mouth 2 (two) times daily with a meal.  . cetirizine (ZYRTEC) 10 MG tablet Take 10 mg by mouth daily.  . chlorhexidine (PERIDEX) 0.12 % solution Use as directed 15 mLs in the mouth or throat 2 (two) times daily. Rinse for 1 min then  spit  . cinacalcet (SENSIPAR) 60 MG tablet Take 1 tablet (60 mg total) by mouth daily. (Patient taking differently: Take 120 mg by mouth daily. )  . divalproex (DEPAKOTE) 500 MG DR tablet Take 2 tablets (1,000 mg total) by mouth at bedtime. (Patient taking differently: Take 500-1,000 mg by mouth See admin instructions. 500 mg every morning, and 1000 mg at bedtime)  . docusate sodium (COLACE) 100 MG capsule Take 1 capsule (100 mg total) by mouth at bedtime.  Marland Kitchen doxycycline (VIBRA-TABS) 100 MG tablet Take 1 tablet (100 mg total) by mouth 2 (two) times daily.  Marland Kitchen escitalopram (LEXAPRO) 10 MG tablet Take 1.5 tablets (15 mg total) by mouth daily. (Patient taking differently: Take 10 mg by mouth daily. Take with 5 mg tablet to equal 15 mg daily)  . escitalopram (LEXAPRO) 5 MG tablet Take 5 mg by mouth daily. Takes along with 10mg  totally 15mg  daily.  . fluticasone (FLOVENT HFA) 110 MCG/ACT inhaler Inhale 2 puffs into the lungs 2 (two) times daily.  . folic acid (FOLVITE) 1 MG tablet Take 2 tablets (2 mg total) by mouth at bedtime.  Marland Kitchen glucagon (GLUCAGON EMERGENCY) 1 MG injection Inject 1 mg into the vein once as needed (low blood sugar).  . hydrocortisone (ANUSOL-HC) 2.5 % rectal cream APPLY TOPICALLY 3 TIMES DAILY FOR 10 DAYS  . insulin aspart (NOVOLOG) 100 UNIT/ML injection Inject 0-15 Units into the skin 3 (three) times daily with meals. Per sliding scale   .  insulin glargine (LANTUS) 100 UNIT/ML injection Inject 0.1 mLs (10 Units total) into the skin at bedtime. (Patient taking differently: Inject 20 Units into the skin at bedtime. )  . lamoTRIgine (LAMICTAL) 150 MG tablet Take 150 mg by mouth 2 (two) times daily.   Marland Kitchen lidocaine-prilocaine (EMLA) cream Apply 1 application topically 3 (three) times a week. Apply to access site 30 minutes before dialysis. (Patient taking differently: Apply 1 application topically every Monday, Wednesday, and Friday. Apply to access site 30 minutes before dialysis. )  .  medroxyPROGESTERone (DEPO-PROVERA) 150 MG/ML injection Inject 150 mg into the muscle every 3 (three) months.  . Multiple Vitamin (THEREMS) TABS Take 1 tablet by mouth daily.  . paliperidone (INVEGA SUSTENNA) 234 MG/1.5ML SUSY injection Inject 234 mg into the muscle See admin instructions. EVERY 28 DAYS   . pantoprazole (PROTONIX) 40 MG tablet Take 40 mg by mouth daily.  . patiromer (VELTASSA) 8.4 g packet Take 1 packet (8.4 g total) by mouth daily. (Patient taking differently: Take 8.4 g by mouth daily. Mixed into 1/3 cup of drink)  . QUEtiapine (SEROQUEL) 100 MG tablet Take 100 mg by mouth at bedtime.  . Tiotropium Bromide-Olodaterol (STIOLTO RESPIMAT) 2.5-2.5 MCG/ACT AERS Inhale 2 puffs into the lungs daily.  . traZODone (DESYREL) 150 MG tablet Take 150 mg by mouth at bedtime.     No Known Allergies  Social History   Socioeconomic History  . Marital status: Single    Spouse name: Not on file  . Number of children: Not on file  . Years of education: Not on file  . Highest education level: Not on file  Occupational History  . Not on file  Social Needs  . Financial resource strain: Not on file  . Food insecurity    Worry: Not on file    Inability: Not on file  . Transportation needs    Medical: Not on file    Non-medical: Not on file  Tobacco Use  . Smoking status: Current Every Day Smoker    Packs/day: 0.25    Years: 0.00    Pack years: 0.00    Types: Cigarettes  . Smokeless tobacco: Current User  Substance and Sexual Activity  . Alcohol use: No  . Drug use: No  . Sexual activity: Not on file  Lifestyle  . Physical activity    Days per week: Not on file    Minutes per session: Not on file  . Stress: Not on file  Relationships  . Social Herbalist on phone: Not on file    Gets together: Not on file    Attends religious service: Not on file    Active member of club or organization: Not on file    Attends meetings of clubs or organizations: Not on file     Relationship status: Not on file  . Intimate partner violence    Fear of current or ex partner: Not on file    Emotionally abused: Not on file    Physically abused: Not on file    Forced sexual activity: Not on file  Other Topics Concern  . Not on file  Social History Narrative  . Not on file     Review of Systems: General: negative for chills, fever, night sweats or weight changes.  Cardiovascular: negative for chest pain, dyspnea on exertion, edema, orthopnea, palpitations, paroxysmal nocturnal dyspnea or shortness of breath Dermatological: negative for rash Respiratory: negative for cough or wheezing Urologic: negative for hematuria  Abdominal: negative for nausea, vomiting, diarrhea, bright red blood per rectum, melena, or hematemesis Neurologic: negative for visual changes, syncope, or dizziness All other systems reviewed and are otherwise negative except as noted above.    Blood pressure 116/80, pulse 70, temperature 98.3 F (36.8 C), height 5\' 2"  (1.575 m), weight 226 lb 9.6 oz (102.8 kg).  General appearance: alert and no distress Neck: no adenopathy, no carotid bruit, no JVD, supple, symmetrical, trachea midline and thyroid not enlarged, symmetric, no tenderness/mass/nodules Lungs: clear to auscultation bilaterally Heart: regular rate and rhythm, S1, S2 normal, no murmur, click, rub or gallop Extremities: extremities normal, atraumatic, no cyanosis or edema Pulses: Absent pedal pulses Skin: Diabetic foot ulcer right great toe Neurologic: Alert and oriented X 3, normal strength and tone. Normal symmetric reflexes. Normal coordination and gait  EKG not performed today  ASSESSMENT AND PLAN:   Peripheral arterial disease ([place]) Kelly [name] returns for follow-up.  I last saw her 02/18/2018 for Dr. Jacqualyn Posey because of a callus on her left posterior heel.  She did not have critical limb ischemia at that time.  She recently had surgery on her right leg with with a broken ankle and a  rod placed in July of this year.  She now has what appears to be critical limb ischemia on her right great toe with recent Dopplers that show incompressible vessels, occluded posterior tibial and peroneal artery with a patent AT.  She is wheelchair-bound bound.  She is a renal dialysis patient.  She is followed by Dr. Duane Boston and Dr. Posey Pronto.  It is unclear to me whether she would benefit from angiography and endovascular therapy.  I will talk to Dr. Alessandra Grout to further define a treatment strategy.      Lorretta Harp MD FACP,FACC,FAHA, [facility] 08/26/2019 3:23 PM

## 2019-08-26 NOTE — Patient Instructions (Signed)
Medication Instructions:  Your physician recommends that you continue on your current medications as directed. Please refer to the Current Medication list given to you today.  If you need a refill on your cardiac medications before your next appointment, please call your pharmacy.   Lab work: NONE If you have labs (blood work) drawn today and your tests are completely normal, you will receive your results only by: Marland Kitchen MyChart Message (if you have MyChart) OR . A paper copy in the mail If you have any lab test that is abnormal or we need to change your treatment, we will call you to review the results.  Testing/Procedures: NONE  Follow-Up: At [facility], you and your health needs are our priority.  As part of our continuing mission to provide you with exceptional heart care, we have created designated Provider Care Teams.  These Care Teams include your primary Cardiologist (physician) and Advanced Practice Providers (APPs -  Physician Assistants and Nurse Practitioners) who all work together to provide you with the care you need, when you need it. You will need a follow up appointment in 3 months with Dr. Quay Burow.  Please call our office 2 months in advance to schedule this/each appointment.

## 2019-08-27 ENCOUNTER — Ambulatory Visit (HOSPITAL_COMMUNITY): Admission: RE | Admit: 2019-08-27 | Payer: Medicaid Other | Source: Ambulatory Visit

## 2019-08-28 ENCOUNTER — Other Ambulatory Visit: Payer: Self-pay

## 2019-08-28 ENCOUNTER — Ambulatory Visit (INDEPENDENT_AMBULATORY_CARE_PROVIDER_SITE_OTHER): Payer: Medicaid Other | Admitting: Podiatry

## 2019-08-28 ENCOUNTER — Encounter: Payer: Self-pay | Admitting: Podiatry

## 2019-08-28 DIAGNOSIS — I96 Gangrene, not elsewhere classified: Secondary | ICD-10-CM

## 2019-08-28 DIAGNOSIS — L97511 Non-pressure chronic ulcer of other part of right foot limited to breakdown of skin: Secondary | ICD-10-CM

## 2019-08-28 DIAGNOSIS — E119 Type 2 diabetes mellitus without complications: Secondary | ICD-10-CM

## 2019-08-28 NOTE — Progress Notes (Signed)
Subjective:  Patient ID: Kelly [name], female    DOB: 15-Feb-1981,  MRN: [id number]  Chief Complaint  Patient presents with  . Foot Pain    pt is here for an ulcer check of the right foot, pt states that her foot is not getting better and is looking to talk regarding the vascular specialist    38 y.o. female presents for wound care.  Had a pleasure seeing Kelly [name] today.  She is here with her caregiver.  Her caregiver states that they recently went to the vascular doctors for a follow-up on their vascular studies.  They are here today for an ulcer check as well as future planning.  Today Kelly [name] is not very vocal.  Her caregiver states that they have been doing Betadine wet-to-dry dressings daily.   Review of Systems: Negative except as noted in the HPI. Denies N/V/F/Ch.  Past Medical History:  Diagnosis Date  . Alcohol abuse    chronic  . Anemia   . Bipolar disorder ([place])   . Chronic kidney disease    Dialysis - M/W/F  . Cognitive changes   . Cognitive impairment   . Depression   . Depression   . Diabetes mellitus without complication ([place])   . Elevated lipids   . Hyperparathyroidism ([place])   . Hypertension   . Intellectual disability   . Staph aureus infection    A/V fistula  . UTI (lower urinary tract infection)     Current Outpatient Medications:  .  acetaminophen (TYLENOL) 325 MG tablet, Take 650 mg by mouth every Monday, Wednesday, and Friday with hemodialysis., Disp: , Rfl:  .  albuterol (PROVENTIL HFA;VENTOLIN HFA) 108 (90 Base) MCG/ACT inhaler, Inhale 2 puffs into the lungs every 4 (four) hours as needed for wheezing or shortness of breath., Disp: , Rfl:  .  aspirin 81 MG chewable tablet, Chew 1 tablet (81 mg total) by mouth daily., Disp: 30 tablet, Rfl: 0 .  B Complex-C-Folic Acid (RENA-VITE RX) 1 MG TABS, Take 1 tablet by mouth daily., Disp: 30 tablet, Rfl: 0 .  calcium acetate (PHOSLO) 667 MG capsule, Take 2 capsules (1,334 mg total) by mouth 2 (two) times  daily with a meal., Disp: 60 capsule, Rfl: 0 .  cetirizine (ZYRTEC) 10 MG tablet, Take 10 mg by mouth daily., Disp: , Rfl:  .  chlorhexidine (PERIDEX) 0.12 % solution, Use as directed 15 mLs in the mouth or throat 2 (two) times daily. Rinse for 1 min then spit, Disp: 120 mL, Rfl: 0 .  cinacalcet (SENSIPAR) 60 MG tablet, Take 1 tablet (60 mg total) by mouth daily. (Patient taking differently: Take 120 mg by mouth daily. ), Disp: 60 tablet, Rfl: 0 .  divalproex (DEPAKOTE) 500 MG DR tablet, Take 2 tablets (1,000 mg total) by mouth at bedtime. (Patient taking differently: Take 500-1,000 mg by mouth See admin instructions. 500 mg every morning, and 1000 mg at bedtime), Disp: 60 tablet, Rfl: 0 .  docusate sodium (COLACE) 100 MG capsule, Take 1 capsule (100 mg total) by mouth at bedtime., Disp: 30 capsule, Rfl: 0 .  doxycycline (VIBRA-TABS) 100 MG tablet, Take 1 tablet (100 mg total) by mouth 2 (two) times daily., Disp: 20 tablet, Rfl: 0 .  escitalopram (LEXAPRO) 10 MG tablet, Take 1.5 tablets (15 mg total) by mouth daily. (Patient taking differently: Take 10 mg by mouth daily. Take with 5 mg tablet to equal 15 mg daily), Disp: 45 tablet, Rfl: 1 .  escitalopram (LEXAPRO) 5  MG tablet, Take 5 mg by mouth daily. Takes along with 10mg  totally 15mg  daily., Disp: , Rfl:  .  fluticasone (FLOVENT HFA) 110 MCG/ACT inhaler, Inhale 2 puffs into the lungs 2 (two) times daily., Disp: , Rfl:  .  folic acid (FOLVITE) 1 MG tablet, Take 2 tablets (2 mg total) by mouth at bedtime., Disp: 30 tablet, Rfl: 0 .  glucagon (GLUCAGON EMERGENCY) 1 MG injection, Inject 1 mg into the vein once as needed (low blood sugar)., Disp: , Rfl:  .  hydrocortisone (ANUSOL-HC) 2.5 % rectal cream, APPLY TOPICALLY 3 TIMES DAILY FOR 10 DAYS, Disp: , Rfl:  .  insulin aspart (NOVOLOG) 100 UNIT/ML injection, Inject 0-15 Units into the skin 3 (three) times daily with meals. Per sliding scale , Disp: , Rfl:  .  insulin glargine (LANTUS) 100 UNIT/ML  injection, Inject 0.1 mLs (10 Units total) into the skin at bedtime. (Patient taking differently: Inject 20 Units into the skin at bedtime. ), Disp: 10 mL, Rfl: 11 .  lamoTRIgine (LAMICTAL) 150 MG tablet, Take 150 mg by mouth 2 (two) times daily. , Disp: , Rfl:  .  lidocaine-prilocaine (EMLA) cream, Apply 1 application topically 3 (three) times a week. Apply to access site 30 minutes before dialysis. (Patient taking differently: Apply 1 application topically every Monday, Wednesday, and Friday. Apply to access site 30 minutes before dialysis. ), Disp: 30 g, Rfl: 0 .  medroxyPROGESTERone (DEPO-PROVERA) 150 MG/ML injection, Inject 150 mg into the muscle every 3 (three) months., Disp: , Rfl:  .  Multiple Vitamin (THEREMS) TABS, Take 1 tablet by mouth daily., Disp: 30 tablet, Rfl: 0 .  paliperidone (INVEGA SUSTENNA) 234 MG/1.5ML SUSY injection, Inject 234 mg into the muscle See admin instructions. EVERY 28 DAYS , Disp: , Rfl:  .  pantoprazole (PROTONIX) 40 MG tablet, Take 40 mg by mouth daily., Disp: , Rfl:  .  patiromer (VELTASSA) 8.4 g packet, Take 1 packet (8.4 g total) by mouth daily. (Patient taking differently: Take 8.4 g by mouth daily. Mixed into 1/3 cup of drink), Disp: 30 packet, Rfl: 0 .  QUEtiapine (SEROQUEL) 100 MG tablet, Take 100 mg by mouth at bedtime., Disp: , Rfl:  .  Tiotropium Bromide-Olodaterol (STIOLTO RESPIMAT) 2.5-2.5 MCG/ACT AERS, Inhale 2 puffs into the lungs daily., Disp: , Rfl:  .  traZODone (DESYREL) 150 MG tablet, Take 150 mg by mouth at bedtime., Disp: , Rfl:   Social History   Tobacco Use  Smoking Status Current Every Day Smoker  . Packs/day: 0.25  . Years: 0.00  . Pack years: 0.00  . Types: Cigarettes  Smokeless Tobacco Current User    No Known Allergies Objective:  There were no vitals filed for this visit. There is no height or weight on file to calculate BMI. Constitutional Well developed. Well nourished.  Vascular  pedal pulses nonpalpable Capillary  refill normal to all digits.  No cyanosis or clubbing noted. Pedal hair growth normal.  Neurologic Normal speech. Oriented to person, place, and time. Protective sensation absent  Dermatologic  ulceration that was present last time has healed with stable eschar.  There are beginning stages of dry gangrene present at the hallux the fourth toe and fifth toe.  There are no local signs of infection noted.  No purulence or malodor present.  The ulcers are unstageable secondary to being pressure and arterial in nature.  Orthopedic: No pain to palpation either foot.   Radiographs: None Assessment:   1. Skin ulcer of right great  toe, limited to breakdown of skin ([place])   2. Diabetes mellitus without complication ([place])   3. Dry gangrene ([place])    Plan:  Patient was evaluated and treated and all questions answered.  Right great toe ulcer now transitioning into beginning stages of dry gangrene  -He also is now healed with dry stable pressure.  At this time there are no concerns for infection however it is also noted there is beginning stages of dry gangrene present at multiple toes across the forefoot. -Given her ABIs PVRs and vascular studies, it is important note that there is poor blood flow down to the forefoot. -I had the pleasure of speaking with with Dr. Gwenlyn Found.  We agreed that it would be better if we establish with her legal guardian that was able to make her medical decisions for her prior to any further decision-making -At this time the wound is stable, and will maintain local wound care with Betadine wet-to-dry dressing changes.  However it is important to know if the dry gangrene turns acute signs of infection patient is a high risk of losing a limb. -I have spoken to her caregiver and he stated that Kelly [name] is her legal guardian [phone]) -I think patient will benefit from an angiography with intervention which will allow her to have adequate flow to be able to either heal the  distal gangrenous changes or any minor foot amputations if warranted. -I will see her again in about 2 weeks and will keep an eye on it and maintain with local wound care for now.  Return in about 2 weeks (around 09/11/2019).

## 2019-08-31 ENCOUNTER — Other Ambulatory Visit: Payer: Self-pay | Admitting: Podiatry

## 2019-08-31 ENCOUNTER — Telehealth: Payer: Self-pay | Admitting: Neurology

## 2019-08-31 ENCOUNTER — Telehealth: Payer: Self-pay | Admitting: *Deleted

## 2019-08-31 DIAGNOSIS — I96 Gangrene, not elsewhere classified: Secondary | ICD-10-CM

## 2019-08-31 DIAGNOSIS — L97511 Non-pressure chronic ulcer of other part of right foot limited to breakdown of skin: Secondary | ICD-10-CM

## 2019-08-31 DIAGNOSIS — E119 Type 2 diabetes mellitus without complications: Secondary | ICD-10-CM

## 2019-08-31 NOTE — Telephone Encounter (Signed)
Faxed referral, clinicals and demographics to [facility].

## 2019-08-31 NOTE — Telephone Encounter (Signed)
Medicaid Josem [id number] (exp. 08/25/19 to 02/21/20) patient is scheduled at GI for 09/10/19.

## 2019-08-31 NOTE — Telephone Encounter (Signed)
-----   Message from Felipa Furnace, DPM sent at 08/28/2019  8:07 PM EDT ----- Reina Fuse,  Can you make her follow up with Dr. Earleen Newport (vascular specialist). Let me know if you have question.  I got his information from Dr. Colvin Caroli.  I might not have his information correct, if so let me know and I can ask Dr. Colvin Caroli  Thanks  Boneta Lucks

## 2019-09-03 ENCOUNTER — Telehealth: Payer: Self-pay | Admitting: *Deleted

## 2019-09-03 ENCOUNTER — Ambulatory Visit
Admission: RE | Admit: 2019-09-03 | Discharge: 2019-09-03 | Disposition: A | Discharge status: Home / Self Care | Payer: Medicaid Other | Source: Ambulatory Visit | Attending: Podiatry | Admitting: Podiatry

## 2019-09-03 ENCOUNTER — Encounter: Payer: Self-pay | Admitting: *Deleted

## 2019-09-03 DIAGNOSIS — E119 Type 2 diabetes mellitus without complications: Secondary | ICD-10-CM

## 2019-09-03 DIAGNOSIS — I96 Gangrene, not elsewhere classified: Secondary | ICD-10-CM

## 2019-09-03 DIAGNOSIS — L97511 Non-pressure chronic ulcer of other part of right foot limited to breakdown of skin: Secondary | ICD-10-CM

## 2019-09-03 HISTORY — PX: IR RADIOLOGIST EVAL & MGMT: IMG5224

## 2019-09-03 NOTE — Telephone Encounter (Signed)
Dr. Posey Pronto states he has not been able to contact Dr. Earleen Newport, and would like Dr. Earleen Newport to contact him on his Cellphone. I called [facility] and informed of Dr. Serita Grit cellphone number, she states she will inform Dr. Earleen Newport.

## 2019-09-03 NOTE — Telephone Encounter (Signed)
Dr. Corrie Mckusick [facility] request call back to discuss pt.

## 2019-09-03 NOTE — Telephone Encounter (Signed)
Left message on Dr. Serita Grit cellphone to call Dr. Earleen Newport, and requested call to confirm he received the message.

## 2019-09-03 NOTE — Telephone Encounter (Signed)
Dr. Posey Pronto called for Dr. Earleen Newport contact information.

## 2019-09-03 NOTE — Consult Note (Signed)
Chief Complaint: Right foot wound  Referring Physician(s): [place] PCP: Dr. Brunetta Genera  History of Present Illness: Kelly [name] is a 38 y.o. female presenting today for evaluation of a right foot wound, and potential candidacy for angiogram/intervention.   She is here today for our evaluation with the director of her living facility -- [facility].  She apparently has no local family, with all family near the [place].  She has been at her current facility for about 4 years, placed after a prolonged hospitalization at [facility] for multiple medical problems.   Her guardian is Ms Porfirio Mylar [phone].  I have spoken to her podiatry doctor, Dr. Posey Pronto.    The wound was initially a right great toe ulcer noted to be 110mm x 59mm on the initial note from 08/04/2019, and was treated as infection/cellulitis.  Currently she is not on antibiotics.  Her care-giver denies any history of fever rigors chills.    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  AT is patent but monophasic.   Prior CT in 2015 shows no significant aortoiliac or CFA disease.   She receives dialysis M-W-F and does not make urine.   Past Medical History:  Diagnosis Date   Alcohol abuse    chronic   Anemia    Bipolar disorder ([place])    Chronic kidney disease    Dialysis - M/W/F   Cognitive changes    Cognitive impairment    Depression    Depression    Diabetes mellitus without complication (HCC)    Elevated lipids    Hyperparathyroidism ([place])    Hypertension    Intellectual disability    Staph aureus infection    A/V fistula   UTI (lower urinary tract infection)     Past Surgical History:  Procedure Laterality Date   A/V SHUNTOGRAM Left 10/07/2018   Procedure: A/V SHUNTOGRAM;  Surgeon: Katha Cabal, MD;  Location: [place] [facility];  Service:  Cardiovascular;  Laterality: Left;   AV FISTULA PLACEMENT     CHOLECYSTECTOMY     dialysis perma cath Right    chest   IR RADIOLOGIST EVAL & MGMT  09/03/2019   PERIPHERAL VASCULAR CATHETERIZATION N/A 05/24/2015   Procedure: Dialysis/Perma Catheter Removal;  Surgeon: Katha Cabal, MD;  Location: [place] [facility];  Service: Cardiovascular;  Laterality: N/A;   PERIPHERAL VASCULAR CATHETERIZATION Left 02/28/2016   Procedure: A/V Shuntogram/Fistulagram;  Surgeon: Katha Cabal, MD;  Location: [place] [facility];  Service: Cardiovascular;  Laterality: Left;   PERIPHERAL VASCULAR CATHETERIZATION N/A 02/28/2016   Procedure: A/V Shunt Intervention;  Surgeon: Katha Cabal, MD;  Location: [place] [facility];  Service: Cardiovascular;  Laterality: N/A;   PERIPHERAL VASCULAR CATHETERIZATION N/A 06/26/2016   Procedure: A/V Shuntogram/Fistulagram;  Surgeon: Katha Cabal, MD;  Location: [place] [facility];  Service: Cardiovascular;  Laterality: N/A;   PERIPHERAL VASCULAR CATHETERIZATION N/A 09/10/2016   Procedure: A/V Shuntogram/Fistulagram;  Surgeon: Algernon Huxley, MD;  Location: [place] [facility];  Service: Cardiovascular;  Laterality: N/A;   PERIPHERAL VASCULAR CATHETERIZATION Left 09/14/2016   Procedure: Thrombectomy;  Surgeon: Katha Cabal, MD;  Location: [place] [facility];  Service: Cardiovascular;  Laterality: Left;   TIBIA IM NAIL INSERTION Right 06/17/2019   Procedure: INTRAMEDULLARY (IM) NAIL TIBIAL;  Surgeon: Nicholes Stairs, MD;  Location: [place];  Service: Orthopedics;  Laterality: Right;  2.5 hrs   VASCULAR ACCESS DEVICE INSERTION Left 04/22/2015   Procedure: INSERTION OF HERO VASCULAR ACCESS DEVICE;  Surgeon: Katha Cabal, MD;  Location: [facility];  Service: Vascular;  Laterality: Left;    Allergies: Patient has no known allergies.  Medications: Prior to Admission medications   Medication Sig Start Date End Date Taking? Authorizing Provider   acetaminophen (TYLENOL) 325 MG tablet Take 650 mg by mouth every Monday, Wednesday, and Friday with hemodialysis.    [provider]  albuterol (PROVENTIL HFA;VENTOLIN HFA) 108 (90 Base) MCG/ACT inhaler Inhale 2 puffs into the lungs every 4 (four) hours as needed for wheezing or shortness of breath.    [provider]  aspirin 81 MG chewable tablet Chew 1 tablet (81 mg total) by mouth daily. 10/02/16   Clapacs, Madie Reno, MD  B Complex-C-Folic Acid (RENA-VITE RX) 1 MG TABS Take 1 tablet by mouth daily. 10/01/16   Clapacs, Madie Reno, MD  calcium acetate (PHOSLO) 667 MG capsule Take 2 capsules (1,334 mg total) by mouth 2 (two) times daily with a meal. 10/01/16   Clapacs, Madie Reno, MD  cetirizine (ZYRTEC) 10 MG tablet Take 10 mg by mouth daily.    [provider]  chlorhexidine (PERIDEX) 0.12 % solution Use as directed 15 mLs in the mouth or throat 2 (two) times daily. Rinse for 1 min then spit 02/01/19   Wieters, [employer] C, PA-C  cinacalcet (SENSIPAR) 60 MG tablet Take 1 tablet (60 mg total) by mouth daily. Patient taking differently: Take 120 mg by mouth daily.  10/01/16   Clapacs, Madie Reno, MD  divalproex (DEPAKOTE) 500 MG DR tablet Take 2 tablets (1,000 mg total) by mouth at bedtime. Patient taking differently: Take 500-1,000 mg by mouth See admin instructions. 500 mg every morning, and 1000 mg at bedtime 10/01/16   Clapacs, Madie Reno, MD  docusate sodium (COLACE) 100 MG capsule Take 1 capsule (100 mg total) by mouth at bedtime. 10/01/16   Clapacs, Madie Reno, MD  doxycycline (VIBRA-TABS) 100 MG tablet Take 1 tablet (100 mg total) by mouth 2 (two) times daily. 08/04/19   Gardiner Barefoot, DPM  escitalopram (LEXAPRO) 10 MG tablet Take 1.5 tablets (15 mg total) by mouth daily. Patient taking differently: Take 10 mg by mouth daily. Take with 5 mg tablet to equal 15 mg daily 10/02/16   Clapacs, Madie Reno, MD  escitalopram (LEXAPRO) 5 MG tablet Take 5 mg by mouth daily. Takes along with 10mg  totally 15mg   daily.    [provider]  fluticasone (FLOVENT HFA) 110 MCG/ACT inhaler Inhale 2 puffs into the lungs 2 (two) times daily.    [provider]  folic acid (FOLVITE) 1 MG tablet Take 2 tablets (2 mg total) by mouth at bedtime. 10/01/16   Clapacs, Madie Reno, MD  glucagon (GLUCAGON EMERGENCY) 1 MG injection Inject 1 mg into the vein once as needed (low blood sugar).    [provider]  hydrocortisone (ANUSOL-HC) 2.5 % rectal cream APPLY TOPICALLY 3 TIMES DAILY FOR 10 DAYS 06/11/19   [provider]  insulin aspart (NOVOLOG) 100 UNIT/ML injection Inject 0-15 Units into the skin 3 (three) times daily with meals. Per sliding scale     [provider]  insulin glargine (LANTUS) 100 UNIT/ML injection Inject 0.1 mLs (10 Units total) into the skin at bedtime. Patient taking differently: Inject 20 Units into the skin at bedtime.  06/01/19   Georgette Shell, MD  lamoTRIgine (LAMICTAL) 150 MG tablet  Take 150 mg by mouth 2 (two) times daily.     [provider]  lidocaine-prilocaine (EMLA) cream Apply 1 application topically 3 (three) times a week. Apply to access site 30 minutes before dialysis. Patient taking differently: Apply 1 application topically every Monday, Wednesday, and Friday. Apply to access site 30 minutes before dialysis.  10/01/16   Clapacs, Madie Reno, MD  medroxyPROGESTERone (DEPO-PROVERA) 150 MG/ML injection Inject 150 mg into the muscle every 3 (three) months.    [provider]  Multiple Vitamin (THEREMS) TABS Take 1 tablet by mouth daily. 10/01/16   Clapacs, Madie Reno, MD  paliperidone (INVEGA SUSTENNA) 234 MG/1.5ML SUSY injection Inject 234 mg into the muscle See admin instructions. EVERY 28 DAYS     [provider]  pantoprazole (PROTONIX) 40 MG tablet Take 40 mg by mouth daily.    [provider]  patiromer (VELTASSA) 8.4 g packet Take 1 packet (8.4 g total) by mouth daily. Patient taking differently: Take 8.4 g by  mouth daily. Mixed into 1/3 cup of drink 10/01/16   Clapacs, Madie Reno, MD  QUEtiapine (SEROQUEL) 100 MG tablet Take 100 mg by mouth at bedtime.    [provider]  Tiotropium Bromide-Olodaterol (STIOLTO RESPIMAT) 2.5-2.5 MCG/ACT AERS Inhale 2 puffs into the lungs daily.    [provider]  traZODone (DESYREL) 150 MG tablet Take 150 mg by mouth at bedtime.    [provider]     Family History  Family history unknown: Yes    Social History   Socioeconomic History   Marital status: Single    Spouse name: Not on file   Number of children: Not on file   Years of education: Not on file   Highest education level: Not on file  Occupational History   Not on file  Social Needs   Financial resource strain: Not on file   Food insecurity    Worry: Not on file    Inability: Not on file   Transportation needs    Medical: Not on file    Non-medical: Not on file  Tobacco Use   Smoking status: Current Every Day Smoker    Packs/day: 0.25    Years: 0.00    Pack years: 0.00    Types: Cigarettes   Smokeless tobacco: Current User  Substance and Sexual Activity   Alcohol use: No   Drug use: No   Sexual activity: Not on file  Lifestyle   Physical activity    Days per week: Not on file    Minutes per session: Not on file   Stress: Not on file  Relationships   Social connections    Talks on phone: Not on file    Gets together: Not on file    Attends religious service: Not on file    Active member of club or organization: Not on file    Attends meetings of clubs or organizations: Not on file    Relationship status: Not on file  Other Topics Concern   Not on file  Social History Narrative   Not on file    E   Review of Systems: A 12 point ROS discussed and pertinent positives are indicated in the HPI above.  All other systems are negative.  Review of Systems  Vital Signs: BP 95/64 (BP Location: Right Arm)    Pulse (!) 117    Temp 98.4 F  (36.9 C)    SpO2 95%   Physical Exam General: 38 yo  AA female appearing stated age.  Obese.  Asleep and not responsive to our interaction.   HEENT: Atraumatic, normocephalic.  Conjugate gaze, extra-ocular motor intact. No scleral icterus or scleral injection. No lesions on external  nose, lips, or gums.  Keloid on ear tragus.   Neck: Symmetric with no goiter enlargement.  Chest/Lungs:  Symmetric chest with inspiration/expiration.  No labored breathing.  Clear to auscultation with no wheezes, rhonchi, or rales.  Heart:  RRR, with no third heart sounds appreciated. No JVD appreciated.  Abdomen:  Soft, NT/ND, with + bowel sounds.   Genito-urinary: Deferred Neurologic: Not alert or responsive.     flacid extremities to exam, but she did raise both arms spontaneously to hold hand with her caregiver.  Pulse Exam:  Palpable right popliteal pulse, with strong doppler multi-phasic signal. Non palpable right AT and DP.  Doppler negative at the right PT.  Monophasic faint signal at the right AT.  Extremities:Gangrene of the right great toe, with slough in the 1st intertriginous region.  Her 4th toe is starting to have dark blue/black discoloration at the dorsal surface tip, with no skin breakdown.       Imaging: Dg Foot 2 Views Right  Result Date: 08/19/2019 Please see detailed radiograph report in office note.  Dg Foot Complete Right  Result Date: 08/04/2019 Please see detailed radiograph report in office note.  Vas [place] Burnard Bunting With/wo Tbi  Result Date: 08/20/2019 LOWER EXTREMITY DOPPLER STUDY Indications: Ulceration. Patient presents with an infected diabetic ulceration              of the right great toe. She was on antibiotics but finished this a              few weeks ago. The toe has become painful with an open wound and              drainage. Podiatry noted healing at the site of the ulcer; Dr.              Posey Pronto recommended vascular studies be repeated. High Risk Factors: Hypertension, Diabetes,  current smoker. Other Factors: ESRD on Dialysis.  Comparison Study: On 01/29/18, ABI's performed and showed a right ABI of 1.24 on                   the right and non-compressible on the left. The TBI's were                   0.63 bilaterally. Performing Technologist: Mariane Masters RVT Supporting Technologist: Wilkie Aye RVT  Examination Guidelines: A complete evaluation includes at minimum, Doppler waveform signals and systolic blood pressure reading at the level of bilateral brachial, anterior tibial, and posterior tibial arteries, when vessel segments are accessible. Bilateral testing is considered an integral part of a complete examination. Photoelectric Plethysmograph (PPG) waveforms and toe systolic pressure readings are included as required and additional duplex testing as needed. Limited examinations for reoccurring indications may be performed as noted.  ABI Findings: +---------+------------------+-----+-----------+--------------------------+  Right     Rt Pressure (mmHg) Index Waveform    Comment                     +---------+------------------+-----+-----------+--------------------------+  Brachial  139                                                              +---------+------------------+-----+-----------+--------------------------+  ATA       254                1.83  multiphasic                             +---------+------------------+-----+-----------+--------------------------+  PTA       254                1.83  monophasic                              +---------+------------------+-----+-----------+--------------------------+  PERO      254                1.83  multiphasic                             +---------+------------------+-----+-----------+--------------------------+  Great Toe                                      Flat PPG, unable to obtain  +---------+------------------+-----+-----------+--------------------------+  +---------+------------------+-----+-----------+------------------------+  Left      Lt Pressure (mmHg) Index Waveform    Comment                   +---------+------------------+-----+-----------+------------------------+  Brachial                                       AVF                       +---------+------------------+-----+-----------+------------------------+  ATA       254                1.83  monophasic                            +---------+------------------+-----+-----------+------------------------+  PTA                                absent      multiple techs attempted  +---------+------------------+-----+-----------+------------------------+  PERO      254                1.83  multiphasic                           +---------+------------------+-----+-----------+------------------------+  Great Toe 37                 0.27                                        +---------+------------------+-----+-----------+------------------------+ +-------+-----------+-----------+------------+------------+  ABI/TBI Today's ABI Today's TBI Previous ABI Previous TBI  +-------+-----------+-----------+------------+------------+  Right   [place]                                                 +-------+-----------+-----------+------------+------------+  Left  [place]          0.27                                   +-------+-----------+-----------+------------+------------+ OES Findings: +----------+---------------+--------+-------+  Right Toes Pressure (mmHg) Waveform Comment  +----------+---------------+--------+-------+  1st Digit                  Absent            +----------+---------------+--------+-------+  2nd Digit                  Absent            +----------+---------------+--------+-------+  3rd Digit                  Abnormal          +----------+---------------+--------+-------+  4th Digit                  Abnormal          +----------+---------------+--------+-------+  5th Digit                  Abnormal           +----------+---------------+--------+-------+  +---------+---------------+--------+-------+  Left Toes Pressure (mmHg) Waveform Comment  +---------+---------------+--------+-------+  1st Digit                 Abnormal          +---------+---------------+--------+-------+  2nd Digit                 Abnormal          +---------+---------------+--------+-------+  3rd Digit                 Abnormal          +---------+---------------+--------+-------+  4th Digit                 Abnormal          +---------+---------------+--------+-------+  5th Digit                 Abnormal          +---------+---------------+--------+-------+    Summary: Right: Resting right ankle-brachial index indicates noncompressible right lower extremity arteries. PPG tracings appear dampened. Unable to obtain TBI. Left: Resting left ankle-brachial index indicates noncompressible left lower extremity arteries. The left toe-brachial index is abnormal. PPG tracings appear dampened.  *See table(s) above for measurements and observations. Suggest bilateral arterial duplex and vascular consult for further evaluation of PVD.  Electronically signed by Carlyle Dolly MD on 08/20/2019 at 1:20:57 PM.    Final    Vas [place] Lower Extremity Arterial Duplex  Result Date: 08/28/2019 LOWER EXTREMITY ARTERIAL DUPLEX STUDY Indications: Ulceration. High Risk Factors: Hypertension, Diabetes, current smoker. Other Factors: Patient presents with an infected diabetic ulceration of the                right great toe. She was on antibiotics but finished this a few                weeks ago. The toe has become painful with an open wound and                drainage.  Current ABI: 08/19/19 the bilateral ABI's were non compressible with abnormal              waveforms. Performing Technologist: Wilkie Aye RVT.  Examination  Guidelines: A complete evaluation includes B-mode imaging, spectral Doppler, color Doppler, and power Doppler as needed of all accessible portions of each  vessel. Bilateral testing is considered an integral part of a complete examination. Limited examinations for reoccurring indications may be performed as noted.  +-----------+--------+-----+--------+---------+--------+  RIGHT       PSV cm/s Ratio Stenosis Waveform  Comments  +-----------+--------+-----+--------+---------+--------+  CFA Prox    129                     triphasic           +-----------+--------+-----+--------+---------+--------+  CFA Distal  92                      triphasic           +-----------+--------+-----+--------+---------+--------+  DFA         97                      triphasic           +-----------+--------+-----+--------+---------+--------+  SFA Prox    68                      triphasic           +-----------+--------+-----+--------+---------+--------+  SFA Mid     84                      triphasic           +-----------+--------+-----+--------+---------+--------+  SFA Distal  58                      triphasic           +-----------+--------+-----+--------+---------+--------+  POP Prox    117                     triphasic           +-----------+--------+-----+--------+---------+--------+  POP Distal  105                     triphasic           +-----------+--------+-----+--------+---------+--------+  TP Trunk    65                      triphasic           +-----------+--------+-----+--------+---------+--------+  ATA Prox    57                      biphasic            +-----------+--------+-----+--------+---------+--------+  ATA Mid     79                      biphasic            +-----------+--------+-----+--------+---------+--------+  PTA Prox    18                                          +-----------+--------+-----+--------+---------+--------+  PTA Mid                    occluded                     +-----------+--------+-----+--------+---------+--------+  PTA Distal  occluded                     +-----------+--------+-----+--------+---------+--------+  PERO Prox                   occluded                     +-----------+--------+-----+--------+---------+--------+  PERO Mid                   occluded                     +-----------+--------+-----+--------+---------+--------+  PERO Distal                occluded                     +-----------+--------+-----+--------+---------+--------+  +-----------+--------+-----+---------------+-----------+--------+  LEFT        PSV cm/s Ratio Stenosis        Waveform    Comments  +-----------+--------+-----+---------------+-----------+--------+  CFA Prox    98                             triphasic             +-----------+--------+-----+---------------+-----------+--------+  CFA Distal  94                             triphasic             +-----------+--------+-----+---------------+-----------+--------+  DFA         95                             triphasic             +-----------+--------+-----+---------------+-----------+--------+  SFA Prox    70                             multiphasic           +-----------+--------+-----+---------------+-----------+--------+  SFA Mid     158            30-49% stenosis multiphasic           +-----------+--------+-----+---------------+-----------+--------+  SFA Distal  72                             multiphasic           +-----------+--------+-----+---------------+-----------+--------+  POP Prox    37                             multiphasic           +-----------+--------+-----+---------------+-----------+--------+  POP Distal  84                             multiphasic           +-----------+--------+-----+---------------+-----------+--------+  TP Trunk    30                             multiphasic           +-----------+--------+-----+---------------+-----------+--------+  ATA Distal  32  monophasic            +-----------+--------+-----+---------------+-----------+--------+  PTA Prox                   occluded                               +-----------+--------+-----+---------------+-----------+--------+  PTA Mid                    occluded                              +-----------+--------+-----+---------------+-----------+--------+  PTA Distal                 occluded                              +-----------+--------+-----+---------------+-----------+--------+  PERO Prox                  occluded                              +-----------+--------+-----+---------------+-----------+--------+  PERO Mid                   occluded                              +-----------+--------+-----+---------------+-----------+--------+  PERO Distal                occluded                              +-----------+--------+-----+---------------+-----------+--------+  Summary:  Right: Medial calcification in the common femoral, femoral, popliteal and tibial arteries. Occlusion of the posterior tibial and peroneal arteries. Left: Medial calcification in the common femoral, femoral, popliteal and tibial arteries. 30-49% stenosis in the superficial femoral artery. Occlusion of the posterior tibial and peroneal arteries.  See table(s) above for measurements and observations. Electronically signed by Kathlyn Sacramento MD on 08/28/2019 at 10:23:41 AM.    Final    [place] Radiologist Eval & Mgmt  Result Date: 09/03/2019 Please refer to notes tab for details about interventional procedure. (Op Note)   Labs:  CBC: Recent Labs    06/21/19 0337 06/23/19 0500 06/24/19 0430 06/25/19 0500  WBC 7.0 5.3 6.3 4.8  HGB 7.9* 7.8* 8.8* 7.7*  HCT 24.6* 25.8* 28.0* 25.3*  PLT 198 263 273 291    COAGS: No results for input(s): INR, APTT in the last 8760 hours.  BMP: Recent Labs    06/21/19 0337 06/23/19 0500 06/24/19 0430 06/25/19 0500  NA 139 142 138 138  K 3.5 3.7 4.0 4.0  CL 99 100 99 99  CO2 28 26 26 27   GLUCOSE 269* 127* 178* 133*  BUN 20 46* 24* 35*  CALCIUM 10.1 10.3 10.6* 10.2  CREATININE 5.86* 9.88* 5.82* 7.21*  GFRNONAA 8* 5* 9* 7*  GFRAA 10* 5* 10*  8*    LIVER FUNCTION TESTS: Recent Labs    01/20/19 0850 05/24/19 1828 05/25/19 0756  05/30/19 1807 06/18/19 2015 06/23/19 0500 06/25/19 0500  BILITOT 0.2* 0.6 0.3  --   --   --   --  0.7  AST 19 20 23   --   --   --   --  11*  ALT 11 14 11   --   --   --   --  <5  ALKPHOS 79 83 74  --   --   --   --  90  PROT 6.7 7.0 6.2*  --   --   --   --  5.9*  ALBUMIN 3.2* 2.7* 2.4*   < > 2.3* 2.7* 2.3* 2.3*   < > = values in this interval not displayed.    TUMOR MARKERS: No results for input(s): AFPTM, CEA, CA199, CHROMGRNA in the last 8760 hours.  Assessment and Plan:  Ms Vinsant is 38 year old female with developing gangrene of the right great toe, rutherford class 6 symptoms of CLI/CLTI.    Her pattern of disease is predominately tibial and small vessel disease, with prior plan film showing significant medial calcinosis and small artery disease (SAD), which is poor prognostic indicator for tissue salvage. Non-invasive exam supports this disease pattern, with non-compressible ABI bilateral reported.   I have discussed with her podiatry doctor, Dr. Posey Pronto, and we agree that attempt at revascularization is reasonable given her age to decrease any morbidity/disability that she would face with BKA.  I do think that the great toe will likely not survive, but improving blood to the ankle/foot may help her keep the limb.   Informed consent regarding treatment strategies was not completely performed given her mental status, so we have reached out to her guardian.  Given the presence of diabetes, healthy foot care is indicated in accord with multi-disciplinary, Class 1 recommendations.1,3 Current recommendations advocate daily foot inspection, good nail care, avoiding barefoot walking, properly fitted footwear, seeking care with problems, and consideration of initiating routine podiatric care with at least annual inspection3.     When we can speak with Ms Marcello Moores, we will discuss endovascular options with  specific risks to include: bleeding, infection, contrast reaction, renal injury/nephropathy, arterial injury/dissection, need for additional procedure/surgery, worsening symptoms/tissue including limb loss, cardiopulmonary collapse, death.    Regarding medical management, maximal medical therapy for reduction of risk factors is indicated as recommended by updated AHA guidelines1.  This includes anti-platelet medication, tight blood glucose control to a HbA1c < 7, tight blood pressure control, maximum-dose HMG-CoA reductase inhibitor.   Annual flu vaccination is also recommended, with Class 1 recommendation1.   Plan: - We will discuss possible aorto-peripheral angiogram and possible intervention with her guardian, Ms Marcello Moores, at first available opportunity. If we proceed, can set up for right lower extremity at [facility], first available.  -Recommend maximal medical therapy for cardiovascular risk reduction, including anti-platelet therapy.  For now ASA 81mg  -- if we have opportunity for the angiogram/intervention, we will likely need to add plavix 75mg  daily for 3 - 6 months.  - If we do angio will need to set up her dialysis session afterwards, potentially same day at [facility], vs next day.  - Healthy foot care habits, given the presence of diabetes, with at least annual foot inspection performed in the setting of DM.    - with history of CLI/wound, annual surveillance ABI is indicated.   -Annual flu vaccination is recommended in the setting of known PAD, in the absence of contra-indications.    ___________________________________________________________________   1Morley Kos MD, et al. 2016 AHA/ACC Guideline on the Management of Patients With Lower Extremity Peripheral Artery Disease: Executive Summary: A Report of the American College of Cardiology/American Heart Association Task Force on Clinical Practice Guidelines. J Am Coll Cardiol. 2017 Mar 21;69(11):1465-1508. doi: 10.1016/j.jacc.2016.11.008.  2 - Norgren L, et al. TASC II Working Group. Inter-society consensus for the management of peripheral arterial disease. Int Tressia Miners. 2007 Jun;26(2):81-157. Review. PubMed PMID: [id number]  3 - Hingorani A, et al. The management of diabetic foot: A clinical practice guideline by the Society for Vascular Surgery in collaboration with the [place] and the Society  for Vascular Medicine. J Vasc Surg. 2016 Feb;63(2 Suppl):3S-21S. doi: 10.1016/j.jvs.2015.10.003. PubMed PMID: [id number].   Thank you for this interesting consult.  I greatly enjoyed meeting Kenyada Blizard and look forward to participating in their care.  A copy of this report was sent to the requesting provider on this date.  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

## 2019-09-09 ENCOUNTER — Telehealth (HOSPITAL_COMMUNITY): Payer: Self-pay

## 2019-09-09 ENCOUNTER — Other Ambulatory Visit (HOSPITAL_COMMUNITY): Payer: Self-pay | Admitting: Interventional Radiology

## 2019-09-09 DIAGNOSIS — L97511 Non-pressure chronic ulcer of other part of right foot limited to breakdown of skin: Secondary | ICD-10-CM

## 2019-09-09 NOTE — Telephone Encounter (Signed)
Called to schedule angiogram, no answer, left vm. AW 

## 2019-09-10 ENCOUNTER — Ambulatory Visit (HOSPITAL_COMMUNITY)
Admission: RE | Admit: 2019-09-10 | Discharge: 2019-09-10 | Disposition: A | Discharge status: Home / Self Care | Payer: Medicaid Other | Source: Ambulatory Visit | Attending: Neurology | Admitting: Neurology

## 2019-09-10 ENCOUNTER — Other Ambulatory Visit: Payer: Self-pay

## 2019-09-10 DIAGNOSIS — G2119 Other drug induced secondary parkinsonism: Secondary | ICD-10-CM | POA: Diagnosis present

## 2019-09-10 DIAGNOSIS — Z79899 Other long term (current) drug therapy: Secondary | ICD-10-CM | POA: Diagnosis present

## 2019-09-10 DIAGNOSIS — R269 Unspecified abnormalities of gait and mobility: Secondary | ICD-10-CM | POA: Insufficient documentation

## 2019-09-11 ENCOUNTER — Ambulatory Visit (INDEPENDENT_AMBULATORY_CARE_PROVIDER_SITE_OTHER): Payer: Medicaid Other

## 2019-09-11 ENCOUNTER — Other Ambulatory Visit: Payer: Self-pay

## 2019-09-11 ENCOUNTER — Ambulatory Visit (INDEPENDENT_AMBULATORY_CARE_PROVIDER_SITE_OTHER): Payer: Medicaid Other | Admitting: Podiatry

## 2019-09-11 DIAGNOSIS — L97511 Non-pressure chronic ulcer of other part of right foot limited to breakdown of skin: Secondary | ICD-10-CM

## 2019-09-11 DIAGNOSIS — I96 Gangrene, not elsewhere classified: Secondary | ICD-10-CM

## 2019-09-11 DIAGNOSIS — I739 Peripheral vascular disease, unspecified: Secondary | ICD-10-CM

## 2019-09-13 ENCOUNTER — Encounter: Payer: Self-pay | Admitting: Podiatry

## 2019-09-13 NOTE — Progress Notes (Signed)
Subjective:  Patient ID: Kelly [name], female    DOB: November 04, 1981,  MRN: [id number]  Chief Complaint  Patient presents with  . Foot Pain    pt is here for 2 week ulcer f/u, pt states that they are scheduled to have surgery, pt does not know if they should be kept on antibiotics    38 y.o. female presents for wound care.  She is here today for a 2-week follow-up of her right stable dry gangrene foot.  She states that she is scheduled for angiogram with Dr. Earleen Newport for next Wednesday.  She is here today with her caretaker primarily in charge of taking care of her.  She denies any other complaints today.   Review of Systems: Negative except as noted in the HPI. Denies N/V/F/Ch.  Past Medical History:  Diagnosis Date  . Alcohol abuse    chronic  . Anemia   . Bipolar disorder ([place])   . Chronic kidney disease    Dialysis - M/W/F  . Cognitive changes   . Cognitive impairment   . Depression   . Depression   . Diabetes mellitus without complication ([place])   . Elevated lipids   . Hyperparathyroidism ([place])   . Hypertension   . Intellectual disability   . Staph aureus infection    A/V fistula  . UTI (lower urinary tract infection)     Current Outpatient Medications:  .  acetaminophen (TYLENOL) 325 MG tablet, Take 650 mg by mouth every Monday, Wednesday, and Friday with hemodialysis. Also to be given every 5 hours as needed for pain, Disp: , Rfl:  .  albuterol (PROVENTIL HFA;VENTOLIN HFA) 108 (90 Base) MCG/ACT inhaler, Inhale 2 puffs into the lungs every 4 (four) hours as needed for wheezing or shortness of breath., Disp: , Rfl:  .  aspirin 81 MG chewable tablet, Chew 1 tablet (81 mg total) by mouth daily., Disp: 30 tablet, Rfl: 0 .  B Complex-C-Folic Acid (RENA-VITE RX) 1 MG TABS, Take 1 tablet by mouth daily., Disp: 30 tablet, Rfl: 0 .  calcium acetate (PHOSLO) 667 MG capsule, Take 2 capsules (1,334 mg total) by mouth 2 (two) times daily with a meal., Disp: 60 capsule, Rfl: 0 .   cetirizine (ZYRTEC) 10 MG tablet, Take 10 mg by mouth daily., Disp: , Rfl:  .  chlorhexidine (PERIDEX) 0.12 % solution, Use as directed 15 mLs in the mouth or throat 2 (two) times daily. Rinse for 1 min then spit, Disp: 120 mL, Rfl: 0 .  cinacalcet (SENSIPAR) 60 MG tablet, Take 1 tablet (60 mg total) by mouth daily. (Patient taking differently: Take 120 mg by mouth daily. ), Disp: 60 tablet, Rfl: 0 .  diclofenac sodium (VOLTAREN) 1 % GEL, Apply 4 g topically 2 (two) times daily as needed (pain)., Disp: , Rfl:  .  divalproex (DEPAKOTE) 500 MG DR tablet, Take 2 tablets (1,000 mg total) by mouth at bedtime. (Patient taking differently: Take 500-1,000 mg by mouth See admin instructions. 500 mg every morning, and 1000 mg at bedtime), Disp: 60 tablet, Rfl: 0 .  docusate sodium (COLACE) 100 MG capsule, Take 1 capsule (100 mg total) by mouth at bedtime., Disp: 30 capsule, Rfl: 0 .  escitalopram (LEXAPRO) 10 MG tablet, Take 1.5 tablets (15 mg total) by mouth daily. (Patient taking differently: Take 10 mg by mouth daily. Take with 5 mg tablet to equal 15 mg daily), Disp: 45 tablet, Rfl: 1 .  escitalopram (LEXAPRO) 5 MG tablet, Take 5 mg by  mouth daily. Takes along with 10mg  totally 15mg  daily., Disp: , Rfl:  .  fluticasone (FLOVENT HFA) 110 MCG/ACT inhaler, Inhale 2 puffs into the lungs 2 (two) times daily., Disp: , Rfl:  .  folic acid (FOLVITE) 1 MG tablet, Take 2 tablets (2 mg total) by mouth at bedtime., Disp: 30 tablet, Rfl: 0 .  gabapentin (NEURONTIN) 300 MG capsule, Take 300 mg by mouth 2 (two) times daily., Disp: , Rfl:  .  glucagon (GLUCAGON EMERGENCY) 1 MG injection, Inject 1 mg into the vein once as needed (low blood sugar)., Disp: , Rfl:  .  insulin aspart (NOVOLOG) 100 UNIT/ML injection, Inject 0-15 Units into the skin 3 (three) times daily with meals. Per sliding scale , Disp: , Rfl:  .  insulin glargine (LANTUS) 100 UNIT/ML injection, Inject 0.1 mLs (10 Units total) into the skin at bedtime.  (Patient taking differently: Inject 20 Units into the skin at bedtime. ), Disp: 10 mL, Rfl: 11 .  lamoTRIgine (LAMICTAL) 150 MG tablet, Take 150 mg by mouth 2 (two) times daily. , Disp: , Rfl:  .  lidocaine-prilocaine (EMLA) cream, Apply 1 application topically 3 (three) times a week. Apply to access site 30 minutes before dialysis. (Patient taking differently: Apply 1 application topically every Monday, Wednesday, and Friday. Apply to access site 30 minutes before dialysis. ), Disp: 30 g, Rfl: 0 .  medroxyPROGESTERone (DEPO-PROVERA) 150 MG/ML injection, Inject 150 mg into the muscle every 3 (three) months., Disp: , Rfl:  .  midodrine (PROAMATINE) 10 MG tablet, Take 10 mg by mouth See admin instructions. Can be given up to 2 times daily during dialysis treatment, Disp: , Rfl:  .  Multiple Vitamin (THEREMS) TABS, Take 1 tablet by mouth daily., Disp: 30 tablet, Rfl: 0 .  paliperidone (INVEGA SUSTENNA) 234 MG/1.5ML SUSY injection, Inject 234 mg into the muscle See admin instructions. EVERY 28 DAYS , Disp: , Rfl:  .  pantoprazole (PROTONIX) 40 MG tablet, Take 40 mg by mouth daily., Disp: , Rfl:  .  patiromer (VELTASSA) 8.4 g packet, Take 1 packet (8.4 g total) by mouth daily. (Patient taking differently: Take 8.4 g by mouth daily. Mixed into 1/3 cup of drink), Disp: 30 packet, Rfl: 0 .  QUEtiapine (SEROQUEL) 100 MG tablet, Take 100 mg by mouth at bedtime., Disp: , Rfl:  .  Tiotropium Bromide-Olodaterol (STIOLTO RESPIMAT) 2.5-2.5 MCG/ACT AERS, Inhale 2 puffs into the lungs daily., Disp: , Rfl:  .  traZODone (DESYREL) 100 MG tablet, Take 100 mg by mouth at bedtime. , Disp: , Rfl:   Social History   Tobacco Use  Smoking Status Current Every Day Smoker  . Packs/day: 0.25  . Years: 0.00  . Pack years: 0.00  . Types: Cigarettes  Smokeless Tobacco Current User    No Known Allergies Objective:  There were no vitals filed for this visit. There is no height or weight on file to calculate BMI.  Constitutional Well developed. Well nourished.  Vascular Dorsalis pedis pulses palpable bilaterally. Posterior tibial pulses palpable bilaterally. Capillary refill normal to all digits.  No cyanosis or clubbing noted. Pedal hair growth normal.  Neurologic Normal speech. Oriented to person, place, and time. 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  Right fourth and fifth digit dry stable gangrene.  No cellulitis noted.  No other signs of infection including malodor or purulent drainage noted.  Orthopedic: No pain to palpation either foot.  Radiographs: Right foot 2 views of skeletally mature adult: No soft tissue gas noted.  No osteomyelitis noted.  Vascular calcifications are noted.  No other bony abnormalities noted. Assessment:   1. Skin ulcer of right great toe, limited to breakdown of skin ([place])   2. Dry gangrene ([place])   3. PAD (peripheral artery disease) ([place])    Plan:  Patient was evaluated and treated and all questions answered.  Right forefoot dry gangrene stable  -At this point there is no clinical signs of infection for any acute intervention to the right foot.  I will await for angiogram to improve the blood flow down to the lower extremity.  After angiogram I would like to wait a period of at least 72 hours to assess the blood flow down to the digital level.  Given that the forefoot is dry and stable at this time, I will see her back again 1 week after the angiogram and at that point I will be able to thoroughly assess the demarcation level of what is salvageable and what is not salvageable of her right foot. -I have spoken to Dr. Earleen Newport extensively about this case, and we are both in agreement that patient will benefit from limb salvage with improvement of blood flow and a possible debridement versus minor amputations of distal digits. -Once the blood flow was restored to the right foot I anticipate that  there will be a clearly defined demarcation level.  Patient will likely need formal debridement versus minor amputations in order to prevent acute infection or wet gangrene. -I will see her back in my office in 1 week after the procedure with Dr. Earleen Newport on Wednesday.   Return in about 2 weeks (around 09/25/2019).

## 2019-09-14 ENCOUNTER — Telehealth: Payer: Self-pay

## 2019-09-14 NOTE — Telephone Encounter (Signed)
I called pt, spoke to pt's caregiver Romulus, per DPR. I discussed pt's MRI results and recommendations with him. Pt will will follow up PRN. Pt's caregiver is asking for a mailed copy of her MRI results and interpretation. Will ask MR to assist with this. Pt's caregiver verbalized understanding and had no further questions.

## 2019-09-14 NOTE — Telephone Encounter (Signed)
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 No acute findings.  Changes are chronic. I do not see an acute or primary neurological cause for her gait and balance problem, as discussed during our appointment in June, this is likely a multifactorial problem, ie due to multiple underlying issues in combination. At this juncture, I recommend as needed follow up.  Please notify her caretaker, Romulus.  Michel Bickers

## 2019-09-14 NOTE — Telephone Encounter (Signed)
Copy mailed out on 09/14/19.

## 2019-09-14 NOTE — Progress Notes (Signed)
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 No acute findings.  Changes are chronic. I do not see an acute or primary neurological cause for her gait and balance problem, as discussed during our appointment in June, this is likely a multifactorial problem, ie due to multiple underlying issues in combination. At this juncture, I recommend as needed follow up.  Please notify her caretaker, Romulus.  Michel Bickers

## 2019-09-15 ENCOUNTER — Other Ambulatory Visit: Payer: Self-pay | Admitting: Physician Assistant

## 2019-09-16 ENCOUNTER — Other Ambulatory Visit (HOSPITAL_COMMUNITY): Payer: Self-pay | Admitting: Interventional Radiology

## 2019-09-16 ENCOUNTER — Telehealth: Payer: Self-pay | Admitting: *Deleted

## 2019-09-16 ENCOUNTER — Observation Stay (HOSPITAL_COMMUNITY)
Admission: RE | Admit: 2019-09-16 | Discharge: 2019-09-17 | Disposition: A | Discharge status: Home / Self Care | Payer: Medicaid Other | Source: Ambulatory Visit | Attending: Interventional Radiology | Admitting: Interventional Radiology

## 2019-09-16 ENCOUNTER — Other Ambulatory Visit: Payer: Self-pay

## 2019-09-16 VITALS — BP 123/66 | HR 118 | Temp 98.9°F | Resp 16 | Ht 62.0 in | Wt 222.7 lb

## 2019-09-16 DIAGNOSIS — E11621 Type 2 diabetes mellitus with foot ulcer: Secondary | ICD-10-CM | POA: Insufficient documentation

## 2019-09-16 DIAGNOSIS — F17210 Nicotine dependence, cigarettes, uncomplicated: Secondary | ICD-10-CM | POA: Insufficient documentation

## 2019-09-16 DIAGNOSIS — L97519 Non-pressure chronic ulcer of other part of right foot with unspecified severity: Secondary | ICD-10-CM | POA: Insufficient documentation

## 2019-09-16 DIAGNOSIS — N186 End stage renal disease: Secondary | ICD-10-CM | POA: Diagnosis not present

## 2019-09-16 DIAGNOSIS — Z7982 Long term (current) use of aspirin: Secondary | ICD-10-CM | POA: Diagnosis not present

## 2019-09-16 DIAGNOSIS — Z95828 Presence of other vascular implants and grafts: Secondary | ICD-10-CM | POA: Insufficient documentation

## 2019-09-16 DIAGNOSIS — E1122 Type 2 diabetes mellitus with diabetic chronic kidney disease: Secondary | ICD-10-CM | POA: Diagnosis not present

## 2019-09-16 DIAGNOSIS — F319 Bipolar disorder, unspecified: Secondary | ICD-10-CM | POA: Insufficient documentation

## 2019-09-16 DIAGNOSIS — I70229 Atherosclerosis of native arteries of extremities with rest pain, unspecified extremity: Secondary | ICD-10-CM

## 2019-09-16 DIAGNOSIS — I129 Hypertensive chronic kidney disease with stage 1 through stage 4 chronic kidney disease, or unspecified chronic kidney disease: Secondary | ICD-10-CM | POA: Diagnosis not present

## 2019-09-16 DIAGNOSIS — F79 Unspecified intellectual disabilities: Secondary | ICD-10-CM | POA: Diagnosis not present

## 2019-09-16 DIAGNOSIS — I70261 Atherosclerosis of native arteries of extremities with gangrene, right leg: Secondary | ICD-10-CM | POA: Insufficient documentation

## 2019-09-16 DIAGNOSIS — I739 Peripheral vascular disease, unspecified: Secondary | ICD-10-CM

## 2019-09-16 DIAGNOSIS — L97511 Non-pressure chronic ulcer of other part of right foot limited to breakdown of skin: Secondary | ICD-10-CM

## 2019-09-16 DIAGNOSIS — Z992 Dependence on renal dialysis: Secondary | ICD-10-CM | POA: Insufficient documentation

## 2019-09-16 DIAGNOSIS — E1152 Type 2 diabetes mellitus with diabetic peripheral angiopathy with gangrene: Principal | ICD-10-CM | POA: Insufficient documentation

## 2019-09-16 DIAGNOSIS — E213 Hyperparathyroidism, unspecified: Secondary | ICD-10-CM | POA: Diagnosis not present

## 2019-09-16 DIAGNOSIS — F209 Schizophrenia, unspecified: Secondary | ICD-10-CM | POA: Diagnosis not present

## 2019-09-16 DIAGNOSIS — Z794 Long term (current) use of insulin: Secondary | ICD-10-CM | POA: Diagnosis not present

## 2019-09-16 DIAGNOSIS — Z79899 Other long term (current) drug therapy: Secondary | ICD-10-CM | POA: Diagnosis not present

## 2019-09-16 DIAGNOSIS — I998 Other disorder of circulatory system: Secondary | ICD-10-CM | POA: Diagnosis present

## 2019-09-16 HISTORY — PX: IR ANGIOGRAM EXTREMITY RIGHT: IMG652

## 2019-09-16 HISTORY — PX: IR US GUIDE VASC ACCESS RIGHT: IMG2390

## 2019-09-16 HISTORY — PX: IR TIB-PERO ART PTA MOD SED: IMG2313

## 2019-09-16 HISTORY — PX: IR TIB-PERO ART UNI PTA EA ADD VESSEL MOD SED: IMG2317

## 2019-09-16 LAB — CBC
HCT: 36.4 % (ref 36.0–46.0)
Hemoglobin: 10.9 g/dL — ABNORMAL LOW (ref 12.0–15.0)
MCH: 29 pg (ref 26.0–34.0)
MCHC: 29.9 g/dL — ABNORMAL LOW (ref 30.0–36.0)
MCV: 96.8 fL (ref 80.0–100.0)
Platelets: 242 10*3/uL (ref 150–400)
RBC: 3.76 MIL/uL — ABNORMAL LOW (ref 3.87–5.11)
RDW: 15.9 % — ABNORMAL HIGH (ref 11.5–15.5)
WBC: 7.2 10*3/uL (ref 4.0–10.5)
nRBC: 0 % (ref 0.0–0.2)

## 2019-09-16 LAB — BASIC METABOLIC PANEL
Anion gap: 12 (ref 5–15)
BUN: 11 mg/dL (ref 6–20)
CO2: 30 mmol/L (ref 22–32)
Calcium: 9.9 mg/dL (ref 8.9–10.3)
Chloride: 95 mmol/L — ABNORMAL LOW (ref 98–111)
Creatinine, Ser: 3.81 mg/dL — ABNORMAL HIGH (ref 0.44–1.00)
GFR calc Af Amer: 17 mL/min — ABNORMAL LOW (ref >60)
GFR calc non Af Amer: 14 mL/min — ABNORMAL LOW (ref >60)
Glucose, Bld: 148 mg/dL — ABNORMAL HIGH (ref 70–99)
Potassium: 3.1 mmol/L — ABNORMAL LOW (ref 3.5–5.1)
Sodium: 137 mmol/L (ref 135–145)

## 2019-09-16 LAB — PROTIME-INR
INR: 1 (ref 0.8–1.2)
Prothrombin Time: 12.9 seconds (ref 11.4–15.2)

## 2019-09-16 LAB — GLUCOSE, CAPILLARY
Glucose-Capillary: 128 mg/dL — ABNORMAL HIGH (ref 70–99)
Glucose-Capillary: 99 mg/dL (ref 70–99)
Glucose-Capillary: 146 mg/dL — ABNORMAL HIGH (ref 70–99)

## 2019-09-16 MED ORDER — ASPIRIN 325 MG PO TABS
650.0000 mg | ORAL_TABLET | Freq: Once | ORAL | Status: AC
  Administered 2019-09-16: 15:00:00 650 mg via ORAL

## 2019-09-16 MED ORDER — DIVALPROEX SODIUM 500 MG PO DR TAB: 500.0000 mg | DELAYED_RELEASE_TABLET | ORAL | Status: DC

## 2019-09-16 MED ORDER — DIVALPROEX SODIUM 500 MG PO DR TAB
500.0000 mg | DELAYED_RELEASE_TABLET | Freq: Every day | ORAL | Status: DC
  Administered 2019-09-16 – 2019-09-17 (×2): 500 mg via ORAL
  Filled 2019-09-16 (×2): qty 1

## 2019-09-16 MED ORDER — QUETIAPINE FUMARATE 100 MG PO TABS
100.0000 mg | ORAL_TABLET | Freq: Every day | ORAL | Status: DC
  Administered 2019-09-16: 100 mg via ORAL
  Filled 2019-09-16: qty 1

## 2019-09-16 MED ORDER — DIVALPROEX SODIUM 500 MG PO DR TAB
1000.0000 mg | DELAYED_RELEASE_TABLET | Freq: Every day | ORAL | Status: DC
  Administered 2019-09-16: 22:00:00 1000 mg via ORAL
  Filled 2019-09-16 (×2): qty 2

## 2019-09-16 MED ORDER — SODIUM CHLORIDE 0.9 % IV SOLN: INTRAVENOUS | Status: DC

## 2019-09-16 MED ORDER — HEPARIN SODIUM (PORCINE) 1000 UNIT/ML IJ SOLN
INTRAMUSCULAR | Status: DC | PRN
  Administered 2019-09-16: 8000 [IU] via INTRAVENOUS

## 2019-09-16 MED ORDER — LIDOCAINE HCL 1 % IJ SOLN
INTRAMUSCULAR | Status: AC
  Filled 2019-09-16: qty 20

## 2019-09-16 MED ORDER — FENTANYL CITRATE (PF) 100 MCG/2ML IJ SOLN
INTRAMUSCULAR | Status: AC
  Filled 2019-09-16: qty 2

## 2019-09-16 MED ORDER — HEPARIN SODIUM (PORCINE) 1000 UNIT/ML IJ SOLN
INTRAMUSCULAR | Status: AC
  Filled 2019-09-16: qty 1

## 2019-09-16 MED ORDER — ONDANSETRON HCL 4 MG/2ML IJ SOLN: 4.0000 mg | Freq: Four times a day (QID) | INTRAMUSCULAR | Status: DC | PRN

## 2019-09-16 MED ORDER — CLOPIDOGREL BISULFATE 75 MG PO TABS
ORAL_TABLET | ORAL | Status: AC
  Administered 2019-09-16: 15:00:00 300 mg via ORAL
  Filled 2019-09-16: qty 4

## 2019-09-16 MED ORDER — INSULIN ASPART 100 UNIT/ML ~~LOC~~ SOLN: 0.0000 [IU] | Freq: Three times a day (TID) | SUBCUTANEOUS | Status: DC

## 2019-09-16 MED ORDER — ACETAMINOPHEN 325 MG PO TABS
650.0000 mg | ORAL_TABLET | ORAL | Status: AC | PRN
  Administered 2019-09-16: 650 mg via ORAL

## 2019-09-16 MED ORDER — IODIXANOL 320 MG/ML IV SOLN
100.0000 mL | Freq: Once | INTRAVENOUS | Status: AC | PRN
  Administered 2019-09-16: 40 mL via INTRA_ARTERIAL

## 2019-09-16 MED ORDER — CLOPIDOGREL BISULFATE 75 MG PO TABS
300.0000 mg | ORAL_TABLET | Freq: Once | ORAL | Status: AC
  Administered 2019-09-16: 15:00:00 300 mg via ORAL

## 2019-09-16 MED ORDER — PROTAMINE SULFATE 10 MG/ML IV SOLN
INTRAVENOUS | Status: DC | PRN
  Administered 2019-09-16: 50 mg via INTRAVENOUS

## 2019-09-16 MED ORDER — GABAPENTIN 300 MG PO CAPS
300.0000 mg | ORAL_CAPSULE | Freq: Two times a day (BID) | ORAL | Status: DC
  Administered 2019-09-16 – 2019-09-17 (×2): 300 mg via ORAL
  Filled 2019-09-16 (×2): qty 1

## 2019-09-16 MED ORDER — LORATADINE 10 MG PO TABS
10.0000 mg | ORAL_TABLET | Freq: Every day | ORAL | Status: DC
  Administered 2019-09-16 – 2019-09-17 (×2): 10 mg via ORAL
  Filled 2019-09-16 (×2): qty 1

## 2019-09-16 MED ORDER — DOCUSATE SODIUM 100 MG PO CAPS
100.0000 mg | ORAL_CAPSULE | Freq: Every day | ORAL | Status: DC
  Administered 2019-09-16: 22:00:00 100 mg via ORAL
  Filled 2019-09-16: qty 1

## 2019-09-16 MED ORDER — ESCITALOPRAM OXALATE 10 MG PO TABS
15.0000 mg | ORAL_TABLET | Freq: Every day | ORAL | Status: DC
  Administered 2019-09-16 – 2019-09-17 (×2): 15 mg via ORAL
  Filled 2019-09-16 (×2): qty 2

## 2019-09-16 MED ORDER — FENTANYL CITRATE (PF) 100 MCG/2ML IJ SOLN
INTRAMUSCULAR | Status: DC | PRN
  Administered 2019-09-16 (×2): 50 ug via INTRAVENOUS
  Administered 2019-09-16 (×3): 25 ug via INTRAVENOUS

## 2019-09-16 MED ORDER — IODIXANOL 320 MG/ML IV SOLN
100.0000 mL | Freq: Once | INTRAVENOUS | Status: AC | PRN
  Administered 2019-09-16: 35 mL via INTRA_ARTERIAL

## 2019-09-16 MED ORDER — KETOROLAC TROMETHAMINE 15 MG/ML IJ SOLN: 15.0000 mg | Freq: Four times a day (QID) | INTRAMUSCULAR | Status: AC | PRN

## 2019-09-16 MED ORDER — ESCITALOPRAM OXALATE 10 MG PO TABS: 5.0000 mg | ORAL_TABLET | Freq: Every day | ORAL | Status: DC

## 2019-09-16 MED ORDER — INSULIN ASPART 100 UNIT/ML ~~LOC~~ SOLN: 6.0000 [IU] | Freq: Three times a day (TID) | SUBCUTANEOUS | Status: DC

## 2019-09-16 MED ORDER — ALTEPLASE 2 MG IJ SOLR
INTRAMUSCULAR | Status: AC
  Filled 2019-09-16: qty 4

## 2019-09-16 MED ORDER — NITROGLYCERIN 1 MG/10 ML FOR IR/CATH LAB
INTRA_ARTERIAL | Status: DC | PRN
  Administered 2019-09-16 (×4): 200 ug via INTRA_ARTERIAL

## 2019-09-16 MED ORDER — ASPIRIN 325 MG PO TABS
ORAL_TABLET | ORAL | Status: AC
  Administered 2019-09-16: 650 mg via ORAL
  Filled 2019-09-16: qty 2

## 2019-09-16 MED ORDER — PANTOPRAZOLE SODIUM 40 MG PO TBEC
40.0000 mg | DELAYED_RELEASE_TABLET | Freq: Every day | ORAL | Status: DC
  Administered 2019-09-16 – 2019-09-17 (×2): 40 mg via ORAL
  Filled 2019-09-16 (×2): qty 1

## 2019-09-16 MED ORDER — BUDESONIDE 0.5 MG/2ML IN SUSP
0.5000 mg | Freq: Two times a day (BID) | RESPIRATORY_TRACT | Status: DC
  Administered 2019-09-17: 0.5 mg via RESPIRATORY_TRACT
  Filled 2019-09-16 (×2): qty 2

## 2019-09-16 MED ORDER — INSULIN ASPART 100 UNIT/ML ~~LOC~~ SOLN: 0.0000 [IU] | Freq: Every day | SUBCUTANEOUS | Status: DC

## 2019-09-16 MED ORDER — PROTAMINE SULFATE 10 MG/ML IV SOLN
50.0000 mg | Freq: Once | INTRAVENOUS | Status: DC
  Filled 2019-09-16 (×2): qty 5

## 2019-09-16 MED ORDER — ALBUTEROL SULFATE (2.5 MG/3ML) 0.083% IN NEBU: 3.0000 mL | INHALATION_SOLUTION | RESPIRATORY_TRACT | Status: DC | PRN

## 2019-09-16 MED ORDER — NITROGLYCERIN 1 MG/10 ML FOR IR/CATH LAB
INTRA_ARTERIAL | Status: AC
  Filled 2019-09-16: qty 10

## 2019-09-16 MED ORDER — ALTEPLASE 2 MG IJ SOLR
INTRAMUSCULAR | Status: DC | PRN
  Administered 2019-09-16 (×2): 2 mg

## 2019-09-16 MED ORDER — LAMOTRIGINE 25 MG PO TABS
150.0000 mg | ORAL_TABLET | Freq: Two times a day (BID) | ORAL | Status: DC
  Administered 2019-09-16 – 2019-09-17 (×2): 150 mg via ORAL
  Filled 2019-09-16 (×2): qty 6

## 2019-09-16 MED ORDER — MIDAZOLAM HCL 2 MG/2ML IJ SOLN
INTRAMUSCULAR | Status: AC
  Filled 2019-09-16: qty 2

## 2019-09-16 NOTE — Progress Notes (Signed)
Patient arrived from IR to [facility]. Right groin level 0 patient placed on monitor and vital signs obtained, CHG done. Patient with Lia Hopping 2 on sacrum. Patient very drowsy but arousable. bp 111/61. Patient with Doppler pulses. Will monitor patient. Shawneen Deetz, Bettina Gavia RN

## 2019-09-16 NOTE — Telephone Encounter (Signed)
Left Secure Chat message informing Dr. Posey Pronto of Dr. Pasty Arch call and Dr. Posey Pronto responded almost immediately that he had left a message for Dr. Earleen Newport with his mobile phone number.

## 2019-09-16 NOTE — Sedation Documentation (Signed)
Pt lethargic.  Oriented to name, place, and activity, does not know month or year.  EtCO2 2mmhg, notified Dr Earleen Newport, no versed to be given at this time, fentanyl only.  Will address need during procedure for versed, per Dr. Earleen Newport.  No pregnancy test, per patient, she states she is not pregnant, Dr. Earleen Newport is aware.

## 2019-09-16 NOTE — Progress Notes (Signed)
Pt arrived unable to communicate of move self.  Director of [place] gave all information gathered.  Radiology/PA notified of pts condition and that pt is a ward of the state.  No consent form presented from [place].  Care giver states that patient must spend the night per patient medical doctor.

## 2019-09-16 NOTE — H&P (Signed)
Chief Complaint: Patient was seen in consultation today for peripheral vascular disease, gangrene right great toe  Referring Physician(s): Dr. Boneta Lucks  Supervising Physician: Corrie Mckusick  Patient Status: [facility] - Out-pt  History of Present Illness: Kelly [name] is a 38 y.o. female with past medical history of bipolar disorder, schizophrenia, cognitive/intellectual impairment, DM, and CKD requiring hemodialysis who presents to [facility] for intervention of her chronic foot wound.  Patient is followed by Dr. Posey Pronto at [place] and [place] and was recently seen in consultation with Dr. Earleen Newport for her peripheral vascular disease.  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  Current care plan is to improve blood flow to the lower extremity to maximize healing and limb salvage.   Patient presents to radiology today along with her caretaker.  She is somnolent and lethargic.  She intermittently arouses enough to respond, but does not answer questions or follow commands.  Her caretaker states this is not her normal behavior but has been consistent for the past 7-10 days.  She is also complaining of increased pain to her right foot. She has had no fevers, chills, abdominal pain, nausea, cough, or shortness of breath at her group home where she lives.  Caretaker reports her appetite has been decreased.   Past Medical History:  Diagnosis Date   Alcohol abuse    chronic   Anemia    Bipolar disorder ([place])    Chronic kidney disease    Dialysis - M/W/F   Cognitive changes    Cognitive impairment    Depression    Depression    Diabetes mellitus without complication (HCC)    Elevated lipids    Hyperparathyroidism ([place])    Hypertension    Intellectual disability    Staph aureus infection    A/V fistula   UTI (lower urinary tract infection)     Past  Surgical History:  Procedure Laterality Date   A/V SHUNTOGRAM Left 10/07/2018   Procedure: A/V SHUNTOGRAM;  Surgeon: Katha Cabal, MD;  Location: [place] [facility];  Service: Cardiovascular;  Laterality: Left;   AV FISTULA PLACEMENT     CHOLECYSTECTOMY     dialysis perma cath Right    chest   IR RADIOLOGIST EVAL & MGMT  09/03/2019   PERIPHERAL VASCULAR CATHETERIZATION N/A 05/24/2015   Procedure: Dialysis/Perma Catheter Removal;  Surgeon: Katha Cabal, MD;  Location: [place] [facility];  Service: Cardiovascular;  Laterality: N/A;   PERIPHERAL VASCULAR CATHETERIZATION Left 02/28/2016   Procedure: A/V Shuntogram/Fistulagram;  Surgeon: Katha Cabal, MD;  Location: [place] [facility];  Service: Cardiovascular;  Laterality: Left;   PERIPHERAL VASCULAR CATHETERIZATION N/A 02/28/2016   Procedure: A/V Shunt Intervention;  Surgeon: Katha Cabal, MD;  Location: [place] [facility];  Service: Cardiovascular;  Laterality: N/A;   PERIPHERAL VASCULAR CATHETERIZATION N/A 06/26/2016   Procedure: A/V Shuntogram/Fistulagram;  Surgeon: Katha Cabal, MD;  Location: [place] [facility];  Service: Cardiovascular;  Laterality: N/A;   PERIPHERAL VASCULAR CATHETERIZATION N/A 09/10/2016   Procedure: A/V Shuntogram/Fistulagram;  Surgeon: Algernon Huxley, MD;  Location: [place] [facility];  Service: Cardiovascular;  Laterality: N/A;   PERIPHERAL VASCULAR CATHETERIZATION Left 09/14/2016   Procedure: Thrombectomy;  Surgeon: Katha Cabal, MD;  Location: [place] [facility];  Service: Cardiovascular;  Laterality: Left;   TIBIA IM NAIL INSERTION Right 06/17/2019  Procedure: INTRAMEDULLARY (IM) NAIL TIBIAL;  Surgeon: Nicholes Stairs, MD;  Location: [place];  Service: Orthopedics;  Laterality: Right;  2.5 hrs   VASCULAR ACCESS DEVICE INSERTION Left 04/22/2015   Procedure: INSERTION OF HERO VASCULAR ACCESS DEVICE;  Surgeon: Katha Cabal, MD;  Location: [facility];  Service:  Vascular;  Laterality: Left;    Allergies: Patient has no known allergies.  Medications: Prior to Admission medications   Medication Sig Start Date End Date Taking? Authorizing Provider  acetaminophen (TYLENOL) 325 MG tablet Take 650 mg by mouth every Monday, Wednesday, and Friday with hemodialysis. Also to be given every 5 hours as needed for pain   Yes [provider]  albuterol (PROVENTIL HFA;VENTOLIN HFA) 108 (90 Base) MCG/ACT inhaler Inhale 2 puffs into the lungs every 4 (four) hours as needed for wheezing or shortness of breath.   Yes [provider]  aspirin 81 MG chewable tablet Chew 1 tablet (81 mg total) by mouth daily. 10/02/16  Yes Clapacs, Madie Reno, MD  B Complex-C-Folic Acid (RENA-VITE RX) 1 MG TABS Take 1 tablet by mouth daily. 10/01/16  Yes Clapacs, Madie Reno, MD  calcium acetate (PHOSLO) 667 MG capsule Take 2 capsules (1,334 mg total) by mouth 2 (two) times daily with a meal. 10/01/16  Yes Clapacs, Madie Reno, MD  cetirizine (ZYRTEC) 10 MG tablet Take 10 mg by mouth daily.   Yes [provider]  chlorhexidine (PERIDEX) 0.12 % solution Use as directed 15 mLs in the mouth or throat 2 (two) times daily. Rinse for 1 min then spit 02/01/19  Yes Wieters, Hallie C, PA-C  cinacalcet (SENSIPAR) 60 MG tablet Take 1 tablet (60 mg total) by mouth daily. Patient taking differently: Take 120 mg by mouth daily.  10/01/16  Yes Clapacs, Madie Reno, MD  diclofenac sodium (VOLTAREN) 1 % GEL Apply 4 g topically 2 (two) times daily as needed (pain).   Yes [provider]  divalproex (DEPAKOTE) 500 MG DR tablet Take 2 tablets (1,000 mg total) by mouth at bedtime. Patient taking differently: Take 500-1,000 mg by mouth See admin instructions. 500 mg every morning, and 1000 mg at bedtime 10/01/16  Yes Clapacs, Madie Reno, MD  docusate sodium (COLACE) 100 MG capsule Take 1 capsule (100 mg total) by mouth at bedtime. 10/01/16  Yes Clapacs, Madie Reno, MD  escitalopram (LEXAPRO) 10 MG tablet Take  1.5 tablets (15 mg total) by mouth daily. Patient taking differently: Take 10 mg by mouth daily. Take with 5 mg tablet to equal 15 mg daily 10/02/16  Yes Clapacs, Madie Reno, MD  escitalopram (LEXAPRO) 5 MG tablet Take 5 mg by mouth daily. Takes along with 1m totally 125mdaily.   Yes [provider]  fluticasone (FLOVENT HFA) 110 MCG/ACT inhaler Inhale 2 puffs into the lungs 2 (two) times daily.   Yes [provider]  folic acid (FOLVITE) 1 MG tablet Take 2 tablets (2 mg total) by mouth at bedtime. 10/01/16  Yes Clapacs, JoMadie RenoMD  gabapentin (NEURONTIN) 300 MG capsule Take 300 mg by mouth 2 (two) times daily.   Yes [provider]  insulin aspart (NOVOLOG) 100 UNIT/ML injection Inject 0-15 Units into the skin 3 (three) times daily with meals. Per sliding scale    Yes [provider]  insulin glargine (LANTUS) 100 UNIT/ML injection Inject 0.1 mLs (10 Units total) into the skin at bedtime. Patient taking differently: Inject 20 Units into the skin at bedtime.  06/01/19  Yes MaLandis Gandy  G, MD  lamoTRIgine (LAMICTAL) 150 MG tablet Take 150 mg by mouth 2 (two) times daily.    Yes [provider]  lidocaine-prilocaine (EMLA) cream Apply 1 application topically 3 (three) times a week. Apply to access site 30 minutes before dialysis. Patient taking differently: Apply 1 application topically every Monday, Wednesday, and Friday. Apply to access site 30 minutes before dialysis.  10/01/16  Yes Clapacs, Madie Reno, MD  midodrine (PROAMATINE) 10 MG tablet Take 10 mg by mouth See admin instructions. Can be given up to 2 times daily during dialysis treatment   Yes [provider]  Multiple Vitamin (THEREMS) TABS Take 1 tablet by mouth daily. 10/01/16  Yes Clapacs, Madie Reno, MD  paliperidone (INVEGA SUSTENNA) 234 MG/1.5ML SUSY injection Inject 234 mg into the muscle See admin instructions. EVERY 28 DAYS    Yes [provider]  pantoprazole (PROTONIX) 40 MG  tablet Take 40 mg by mouth daily.   Yes [provider]  patiromer (VELTASSA) 8.4 g packet Take 1 packet (8.4 g total) by mouth daily. Patient taking differently: Take 8.4 g by mouth daily. Mixed into 1/3 cup of drink 10/01/16  Yes Clapacs, Madie Reno, MD  QUEtiapine (SEROQUEL) 100 MG tablet Take 100 mg by mouth at bedtime.   Yes [provider]  Tiotropium Bromide-Olodaterol (STIOLTO RESPIMAT) 2.5-2.5 MCG/ACT AERS Inhale 2 puffs into the lungs daily.   Yes [provider]  traZODone (DESYREL) 100 MG tablet Take 100 mg by mouth at bedtime.    Yes [provider]  glucagon (GLUCAGON EMERGENCY) 1 MG injection Inject 1 mg into the vein once as needed (low blood sugar).    [provider]  medroxyPROGESTERone (DEPO-PROVERA) 150 MG/ML injection Inject 150 mg into the muscle every 3 (three) months.    [provider]     Family History  Family history unknown: Yes    Social History   Socioeconomic History   Marital status: Single    Spouse name: Not on file   Number of children: Not on file   Years of education: Not on file   Highest education level: Not on file  Occupational History   Not on file  Social Needs   Financial resource strain: Not on file   Food insecurity    Worry: Not on file    Inability: Not on file   Transportation needs    Medical: Not on file    Non-medical: Not on file  Tobacco Use   Smoking status: Current Every Day Smoker    Packs/day: 0.25    Years: 0.00    Pack years: 0.00    Types: Cigarettes   Smokeless tobacco: Current User  Substance and Sexual Activity   Alcohol use: No   Drug use: No   Sexual activity: Not on file  Lifestyle   Physical activity    Days per week: Not on file    Minutes per session: Not on file   Stress: Not on file  Relationships   Social connections    Talks on phone: Not on file    Gets together: Not on file    Attends religious service: Not on file     Active member of club or organization: Not on file    Attends meetings of clubs or organizations: Not on file    Relationship status: Not on file  Other Topics Concern   Not on file  Social History Narrative   Not on file  Review of Systems: A 12 point ROS discussed and pertinent positives are indicated in the HPI above.  All other systems are negative.  Review of Systems  Unable to perform ROS: Mental status change    Vital Signs: BP (!) 102/58    Pulse 97    Temp (!) 97.3 F (36.3 C) (Skin)    Resp 12    Ht 5' 2"  (1.575 m)    Wt 222 lb 10.6 oz (101 kg)    SpO2 99%    BMI 40.73 kg/m   Physical Exam Vitals signs and nursing note reviewed.  Constitutional:      General: She is not in acute distress.    Appearance: Normal appearance. She is not ill-appearing.  HENT:     Head: Normocephalic and atraumatic.     Mouth/Throat:     Mouth: Mucous membranes are moist.     Pharynx: Oropharynx is clear.  Cardiovascular:     Rate and Rhythm: Normal rate and regular rhythm.     Comments: No palpable distal pulse.  Pulmonary:     Effort: Pulmonary effort is normal. No respiratory distress.     Breath sounds: Normal breath sounds.  Musculoskeletal:     Comments: Right great toe discoloration extending to the base. Appears necrotic. Area of ulceration to the medial aspect with no active drainage or oozing.  Discoloration to the 4th and 5th toes, small wound to the lateral aspect of the 5th toe.    Neurological:     General: No focal deficit present.     Mental Status: She is oriented to person, place, and time.  Psychiatric:        Mood and Affect: Mood normal.        Behavior: Behavior normal.        Thought Content: Thought content normal.        Judgment: Judgment normal.      MD Evaluation Airway: WNL Heart: WNL Abdomen: WNL Chest/ Lungs: WNL ASA  Classification: 3 Mallampati/Airway Score: Three   Imaging: Mr Brain Wo Contrast  Result Date: 09/10/2019 CLINICAL  DATA:  Ataxia, slowly progressive or long duration. 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 EXAM: MRI HEAD WITHOUT CONTRAST TECHNIQUE: Multiplanar, multiecho pulse sequences of the brain and surrounding structures were obtained without intravenous contrast. COMPARISON:  Head CT 05/24/2019 FINDINGS: Brain: There is no evidence of acute infarct. No evidence of intracranial mass. No midline shift or extra-axial fluid collection. No definite chronic intracranial blood products. Basal ganglia mineralization. Redemonstrated severely age-advanced generalized parenchymal atrophy. There is significant volume loss of the bilateral caudate heads bilaterally (series 9, image 14). Redemonstrated chronic infarct within the left cerebellum. Vascular: Flow voids maintained within the proximal large arterial vessels. Skull and upper cervical spine: No focal marrow lesion within the calvarium. Nonspecific T1 hypointensity within the dens. Sinuses/Orbits: Visualized orbits demonstrate no acute abnormality. No significant paranasal sinus disease. Trace fluid within left mastoid air cells. IMPRESSION: 1. No evidence of acute intracranial abnormality. 2. Redemonstrated severely age-advanced generalized parenchymal atrophy. There is also significant symmetric volume loss within the caudate heads. Findings may be due to a combination of toxic/metabolic and ischemic factors given the patient's known history of alcohol abuse, diabetes mellitus and hypertension. 3. Redemonstrated chronic left cerebellar infarct. Electronically Signed   By: Kellie Simmering   On: 09/10/2019 17:33   Dg Foot 2 Views Right  Result Date: 08/19/2019 Please see detailed radiograph report in office note.  Dg Foot Complete  Right  Result Date: 09/11/2019 Please see detailed radiograph report in office note.  Vas [place] Burn[place] B[place] With/wo Tbi  Result Date: 08/20/2019 LOWER EXTREMITY DOPPLER STUDY Indications: Ulceration. Patient presents  with an infected diabetic ulceration              of the right great toe. She was on antibiotics but finished this a              few weeks ago. The toe has become painful with an open wound and              drainage. Podiatry noted healing at the site of the ulcer; Dr.              Posey Pronto recommended vascular studies be repeated. High Risk Factors: Hypertension, Diabetes, current smoker. Other Factors: ESRD on Dialysis.  Comparison Study: On 01/29/18, ABI's performed and showed a right ABI of 1.24 on                   the right and non-compressible on the left. The TBI's were                   0.63 bilaterally. Performing Technologist: Mariane Masters RVT Supporting Technologist: Wilkie Aye RVT  Examination Guidelines: A complete evaluation includes at minimum, Doppler waveform signals and systolic blood pressure reading at the level of bilateral brachial, anterior tibial, and posterior tibial arteries, when vessel segments are accessible. Bilateral testing is considered an integral part of a complete examination. Photoelectric Plethysmograph (PPG) waveforms and toe systolic pressure readings are included as required and additional duplex testing as needed. Limited examinations for reoccurring indications may be performed as noted.  ABI Findings: +---------+------------------+-----+-----------+--------------------------+  Right     Rt Pressure (mmHg) Index Waveform    Comment                     +---------+------------------+-----+-----------+--------------------------+  Brachial  139                                                              +---------+------------------+-----+-----------+--------------------------+  ATA       254                1.83  multiphasic                             +---------+------------------+-----+-----------+--------------------------+  PTA       254                1.83  monophasic                               +---------+------------------+-----+-----------+--------------------------+  PERO      254                1.83  multiphasic                             +---------+------------------+-----+-----------+--------------------------+  Great Toe  Flat PPG, unable to obtain  +---------+------------------+-----+-----------+--------------------------+ +---------+------------------+-----+-----------+------------------------+  Left      Lt Pressure (mmHg) Index Waveform    Comment                   +---------+------------------+-----+-----------+------------------------+  Brachial                                       AVF                       +---------+------------------+-----+-----------+------------------------+  ATA       254                1.83  monophasic                            +---------+------------------+-----+-----------+------------------------+  PTA                                absent      multiple techs attempted  +---------+------------------+-----+-----------+------------------------+  PERO      254                1.83  multiphasic                           +---------+------------------+-----+-----------+------------------------+  Great Toe 37                 0.27                                        +---------+------------------+-----+-----------+------------------------+ +-------+-----------+-----------+------------+------------+  ABI/TBI Today's ABI Today's TBI Previous ABI Previous TBI  +-------+-----------+-----------+------------+------------+  Right   [place]                                                 +-------+-----------+-----------+------------+------------+  Left    [place]          0.27                                   +-------+-----------+-----------+------------+------------+ OES Findings: +----------+---------------+--------+-------+  Right Toes Pressure (mmHg) Waveform Comment  +----------+---------------+--------+-------+  1st Digit                  Absent             +----------+---------------+--------+-------+  2nd Digit                  Absent            +----------+---------------+--------+-------+  3rd Digit                  Abnormal          +----------+---------------+--------+-------+  4th Digit                  Abnormal          +----------+---------------+--------+-------+  5th Digit                  Abnormal          +----------+---------------+--------+-------+  +---------+---------------+--------+-------+  Left Toes Pressure (mmHg) Waveform Comment  +---------+---------------+--------+-------+  1st Digit                 Abnormal          +---------+---------------+--------+-------+  2nd Digit                 Abnormal          +---------+---------------+--------+-------+  3rd Digit                 Abnormal          +---------+---------------+--------+-------+  4th Digit                 Abnormal          +---------+---------------+--------+-------+  5th Digit                 Abnormal          +---------+---------------+--------+-------+    Summary: Right: Resting right ankle-brachial index indicates noncompressible right lower extremity arteries. PPG tracings appear dampened. Unable to obtain TBI. Left: Resting left ankle-brachial index indicates noncompressible left lower extremity arteries. The left toe-brachial index is abnormal. PPG tracings appear dampened.  *See table(s) above for measurements and observations. Suggest bilateral arterial duplex and vascular consult for further evaluation of PVD.  Electronically signed by Carlyle Dolly MD on 08/20/2019 at 1:20:57 PM.    Final    Vas [place] Lower Extremity Arterial Duplex  Result Date: 08/28/2019 LOWER EXTREMITY ARTERIAL DUPLEX STUDY Indications: Ulceration. High Risk Factors: Hypertension, Diabetes, current smoker. Other Factors: Patient presents with an infected diabetic ulceration of the                right great toe. She was on antibiotics but finished this a few                weeks ago. The toe  has become painful with an open wound and                drainage.  Current ABI: 08/19/19 the bilateral ABI's were non compressible with abnormal              waveforms. Performing Technologist: Wilkie Aye RVT.  Examination Guidelines: A complete evaluation includes B-mode imaging, spectral Doppler, color Doppler, and power Doppler as needed of all accessible portions of each vessel. Bilateral testing is considered an integral part of a complete examination. Limited examinations for reoccurring indications may be performed as noted.  +-----------+--------+-----+--------+---------+--------+  RIGHT       PSV cm/s Ratio Stenosis Waveform  Comments  +-----------+--------+-----+--------+---------+--------+  CFA Prox    129                     triphasic           +-----------+--------+-----+--------+---------+--------+  CFA Distal  92                      triphasic           +-----------+--------+-----+--------+---------+--------+  DFA         97                      triphasic           +-----------+--------+-----+--------+---------+--------+  SFA Prox    68                      triphasic           +-----------+--------+-----+--------+---------+--------+  SFA Mid     84                      triphasic           +-----------+--------+-----+--------+---------+--------+  SFA Distal  58                      triphasic           +-----------+--------+-----+--------+---------+--------+  POP Prox    117                     triphasic           +-----------+--------+-----+--------+---------+--------+  POP Distal  105                     triphasic           +-----------+--------+-----+--------+---------+--------+  TP Trunk    65                      triphasic           +-----------+--------+-----+--------+---------+--------+  ATA Prox    57                      biphasic            +-----------+--------+-----+--------+---------+--------+  ATA Mid     79                      biphasic             +-----------+--------+-----+--------+---------+--------+  PTA Prox    18                                          +-----------+--------+-----+--------+---------+--------+  PTA Mid                    occluded                     +-----------+--------+-----+--------+---------+--------+  PTA Distal                 occluded                     +-----------+--------+-----+--------+---------+--------+  PERO Prox                  occluded                     +-----------+--------+-----+--------+---------+--------+  PERO Mid                   occluded                     +-----------+--------+-----+--------+---------+--------+  PERO Distal                occluded                     +-----------+--------+-----+--------+---------+--------+  +-----------+--------+-----+---------------+-----------+--------+  LEFT        PSV cm/s Ratio Stenosis        Waveform    Comments  +-----------+--------+-----+---------------+-----------+--------+  CFA Prox    98  triphasic             +-----------+--------+-----+---------------+-----------+--------+  CFA Distal  94                             triphasic             +-----------+--------+-----+---------------+-----------+--------+  DFA         95                             triphasic             +-----------+--------+-----+---------------+-----------+--------+  SFA Prox    70                             multiphasic           +-----------+--------+-----+---------------+-----------+--------+  SFA Mid     158            30-49% stenosis multiphasic           +-----------+--------+-----+---------------+-----------+--------+  SFA Distal  72                             multiphasic           +-----------+--------+-----+---------------+-----------+--------+  POP Prox    37                             multiphasic           +-----------+--------+-----+---------------+-----------+--------+  POP Distal  84                             multiphasic            +-----------+--------+-----+---------------+-----------+--------+  TP Trunk    30                             multiphasic           +-----------+--------+-----+---------------+-----------+--------+  ATA Distal  32                             monophasic            +-----------+--------+-----+---------------+-----------+--------+  PTA Prox                   occluded                              +-----------+--------+-----+---------------+-----------+--------+  PTA Mid                    occluded                              +-----------+--------+-----+---------------+-----------+--------+  PTA Distal                 occluded                              +-----------+--------+-----+---------------+-----------+--------+  PERO Prox  occluded                              +-----------+--------+-----+---------------+-----------+--------+  PERO Mid                   occluded                              +-----------+--------+-----+---------------+-----------+--------+  PERO Distal                occluded                              +-----------+--------+-----+---------------+-----------+--------+  Summary:  Right: Medial calcification in the common femoral, femoral, popliteal and tibial arteries. Occlusion of the posterior tibial and peroneal arteries. Left: Medial calcification in the common femoral, femoral, popliteal and tibial arteries. 30-49% stenosis in the superficial femoral artery. Occlusion of the posterior tibial and peroneal arteries.  See table(s) above for measurements and observations. Electronically signed by Kathlyn Sacramento MD on 08/28/2019 at 10:23:41 AM.    Final    Ir Radiologist Eval & Mgmt  Result Date: 09/03/2019 Please refer to notes tab for details about interventional procedure. (Op Note)   Labs:  CBC: Recent Labs    06/21/19 0337 06/23/19 0500 06/24/19 0430 06/25/19 0500  WBC 7.0 5.3 6.3 4.8  HGB 7.9* 7.8* 8.8* 7.7*  HCT 24.6* 25.8* 28.0* 25.3*  PLT 198 263 273 291     COAGS: No results for input(s): INR, APTT in the last 8760 hours.  BMP: Recent Labs    06/21/19 0337 06/23/19 0500 06/24/19 0430 06/25/19 0500  NA 139 142 138 138  K 3.5 3.7 4.0 4.0  CL 99 100 99 99  CO2 28 26 26 27   GLUCOSE 269* 127* 178* 133*  BUN 20 46* 24* 35*  CALCIUM 10.1 10.3 10.6* 10.2  CREATININE 5.86* 9.88* 5.82* 7.21*  GFRNONAA 8* 5* 9* 7*  GFRAA 10* 5* 10* 8*    LIVER FUNCTION TESTS: Recent Labs    01/20/19 0850 05/24/19 1828 05/25/19 0756  05/30/19 1807 06/18/19 2015 06/23/19 0500 06/25/19 0500  BILITOT 0.2* 0.6 0.3  --   --   --   --  0.7  AST 19 20 23   --   --   --   --  11*  ALT 11 14 11   --   --   --   --  <5  ALKPHOS 79 83 74  --   --   --   --  90  PROT 6.7 7.0 6.2*  --   --   --   --  5.9*  ALBUMIN 3.2* 2.7* 2.4*   < > 2.3* 2.7* 2.3* 2.3*   < > = values in this interval not displayed.    TUMOR MARKERS: No results for input(s): AFPTM, CEA, CA199, CHROMGRNA in the last 8760 hours.  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  IR consulted for angiogram with possible intervention at the request of Dr. Posey Pronto. Case reviewed by Dr. Earleen Newport who has met with patient and her caregiver in consultation and approves patient for procedure.  Patient presents today more somnolent than usual, but in stable condition.  Her vital signs are stable.  She  has no overt infectious concerns, although it does appear that her gangrene, possible necrosis is worsening.  She also has additional discoloration of her 4th and 5th toes.  Discussed with Dr. Earleen Newport.  Will plan for angiogram and intervention today.  Obtain blood work for possible infectious work-up.   She did go to dialysis this AM prior to her arrival and plans to keep her usual appointment on Friday.  Will admit for observation and monitoring after procedure today.  Plan to  discharge in the AM if remains stable and without worsening of her current mental and medical status.  She has been NPO and is not currently on blood thinners. He has been taking her 38m aspirin as prescribed.   Risks and benefits of angiogram with intervention were discussed with the patient including, but not limited to bleeding, infection, vascular injury or contrast induced renal failure.  This interventional procedure involves the use of X-rays and because of the nature of the planned procedure, it is possible that we will have prolonged use of X-ray fluoroscopy.  Potential radiation risks to you include (but are not limited to) the following: - A slightly elevated risk for cancer  several years later in life. This risk is typically less than 0.5% percent. This risk is low in comparison to the normal incidence of human cancer, which is 33% for women and 50% for men according to the AWilson - Radiation induced injury can include skin redness, resembling a rash, tissue breakdown / ulcers and hair loss (which can be temporary or permanent).   The likelihood of either of these occurring depends on the difficulty of the procedure and whether you are sensitive to radiation due to previous procedures, disease, or genetic conditions.   IF your procedure requires a prolonged use of radiation, you will be notified and given written instructions for further action.  It is your responsibility to monitor the irradiated area for the 2 weeks following the procedure and to notify your physician if you are concerned that you have suffered a radiation induced injury.    All of the patient's questions were answered, patient is agreeable to proceed.  Consent signed and in chart.  Thank you for this interesting consult.  I greatly enjoyed meeting FOaklie Durrettand look forward to participating in their care.  A copy of this report was sent to the requesting provider on this  date.  Electronically Signed: KDocia Barrier PA 09/16/2019, 2:28 PM   I spent a total of  60 Minutes   in face to face in clinical consultation, greater than 50% of which was counseling/coordinating care for peripheral vascular disease, dry gangrene right foot.

## 2019-09-16 NOTE — Sedation Documentation (Signed)
Notified dr Earleen Newport that HR is elevated.  Will cont to monitor

## 2019-09-16 NOTE — Procedures (Signed)
Interventional Radiology Procedure Note  Procedure: US guided right CFA access  Angiogram RLE  Treatment of AT and PT occlusions, with restoration of inline flow through the AT and PT to the pedal loop.   Angioseal closure  Findings: Occluded AT at the ankle, near the hardware Occluded PT proximal.   Restoration of flow through the AT and PT with no residual greater than 30%.  PT and AT pulses at the ankle multi-phasic  .  Complications: None  Recommendations:  - 23 hr obs - prn pain meds - advance diet - 4 hrs right hip straight - routine wound care right hip - local wound of the foot  Signed,  Dulcy Fanny. Earleen Newport, DO

## 2019-09-16 NOTE — Telephone Encounter (Signed)
Dr. Earleen Newport - Intervention Radiology request a call back on his cellphone. I left message on Dr. Serita Grit cellphone to call Dr. Earleen Newport.

## 2019-09-16 NOTE — Sedation Documentation (Signed)
Report given to Aura Fey RN, pt care transferred to [facility]

## 2019-09-17 DIAGNOSIS — E1152 Type 2 diabetes mellitus with diabetic peripheral angiopathy with gangrene: Secondary | ICD-10-CM | POA: Diagnosis not present

## 2019-09-17 LAB — GLUCOSE, CAPILLARY
Glucose-Capillary: 102 mg/dL — ABNORMAL HIGH (ref 70–99)
Glucose-Capillary: 76 mg/dL (ref 70–99)
Glucose-Capillary: 81 mg/dL (ref 70–99)

## 2019-09-17 MED ORDER — CLOPIDOGREL BISULFATE 75 MG PO TABS: 75.0000 mg | ORAL_TABLET | Freq: Every day | ORAL | 3 refills | Status: AC

## 2019-09-17 MED ORDER — CLOPIDOGREL BISULFATE 75 MG PO TABS
75.0000 mg | ORAL_TABLET | Freq: Every day | ORAL | Status: DC
  Administered 2019-09-17: 75 mg via ORAL
  Filled 2019-09-17: qty 1

## 2019-09-17 NOTE — Discharge Summary (Signed)
Patient ID: Kelly [name] MRN: [id number] DOB/AGE: 1981-02-04 38 y.o.  Admit date: 09/16/2019 Discharge date: 09/17/2019  Supervising Physician: Daryll Brod  Patient Status: [facility] - In-pt  Admission Diagnoses: Peripheral vascular disease  Discharge Diagnoses:  Active Problems:   Peripheral vascular disease ([place])   Critical lower limb ischemia   Discharged Condition: stable  Hospital Course:   Kelly [name] is a 38 y.o. female with past medical history of bipolar disorder, schizophrenia, cognitive/intellectual impairment, DM, and CKD requiring hemodialysis who presented to [facility] yesterday for peripheral vascular disease, PT and AT occlusive disease with plans for angiogram with intervention with Dr. Earleen Newport.  Patient presented from her group home lethargic and uncooperative.  She has significant history of mental illness, although per her caretaker the somnolence is new since the worsening of her right foot.  She underwent initial work-up which [name] negative for signs of acute infection or sepsis.  Her vital signs were stable with the exception of mild tachycardia.  Her WBC [name] 7.4.  She [name] afebrile.  Decision [name] made to proceed with intervention as the right foot is a likely source of her current condition.  He underwent successful treatment of her AT and PT occlusions with restoration of flow through pedal loop. She tolerated the procedure fair as she [name] found to be hypotensive post-procedure which resolved with repositioning and gentle hydration.  She [name] admitted overnight for observation.  She remained stable overnight.  She is arousable this AM although remains sleepy.  Her foot and pulses are unchanged from prior exam.  Her toenail on her 5th toe has come off.  She has scheduled follow-up with Dr. Posey Pronto, her podiatrist in the next 1-2 weeks.  He is aware of her procedure yesterday.  Discussed case with Dr. Earleen Newport this AM as well as patient's caregiver who understands  patient's procedure yesterday [name] successful in improving blood flow, yet her mental status remains unchanged.  He is also educated on patient's need for Plavix 75 mg PO daily as well as continued aspirin 81 mg daily. He voices understanding.  Patient is stable for discharge back to her group home today.    Discharge Exam: Blood pressure 102/62, pulse (!) 111, temperature 98.9 F (37.2 C), temperature source Oral, resp. rate 18, height 5\' 2"  (1.575 m), weight 222 lb 10.6 oz (101 kg), SpO2 99 %. General appearance: alert, no distress and uncooperative Resp: clear to auscultation bilaterally Cardio: regular rate and rhythm, S1, S2 normal, no murmur, click, rub or gallop GI: soft, non-tender; bowel sounds normal; no masses,  no organomegaly Extremities: RLE unchanged from prior exam.  LLE stable Pulses: 2+ and symmetric Nonpalpable pulses bilaterally.  DP and PT pulses faint by doppler to RLE, 1+ to LLE Skin: Skin color, texture, turgor normal. No rashes or lesions or Necrosis to right great toe.  Wound stable. Discoloration vs. early necrosis to right 4th and 5th toe. Nail to 5th toe has fallen off. Incision/Wound:  Disposition:    Allergies as of 09/17/2019   No Known Allergies     Medication List    TAKE these medications   acetaminophen 325 MG tablet Commonly known as: TYLENOL Take 650 mg by mouth every Monday, Wednesday, and Friday with hemodialysis. Also to be given every 5 hours as needed for pain   albuterol 108 (90 Base) MCG/ACT inhaler Commonly known as: VENTOLIN HFA Inhale 2 puffs into the lungs every 4 (four) hours as needed for wheezing or shortness of  breath.   aspirin 81 MG chewable tablet Chew 1 tablet (81 mg total) by mouth daily.   calcium acetate 667 MG capsule Commonly known as: PHOSLO Take 2 capsules (1,334 mg total) by mouth 2 (two) times daily with a meal.   cetirizine 10 MG tablet Commonly known as: ZYRTEC Take 10 mg by mouth daily.   chlorhexidine  0.12 % solution Commonly known as: PERIDEX Use as directed 15 mLs in the mouth or throat 2 (two) times daily. Rinse for 1 min then spit   cinacalcet 60 MG tablet Commonly known as: SENSIPAR Take 1 tablet (60 mg total) by mouth daily. What changed: how much to take   clopidogrel 75 MG tablet Commonly known as: PLAVIX Take 1 tablet (75 mg total) by mouth daily.   diclofenac sodium 1 % Gel Commonly known as: VOLTAREN Apply 4 g topically 2 (two) times daily as needed (pain).   divalproex 500 MG DR tablet Commonly known as: DEPAKOTE Take 2 tablets (1,000 mg total) by mouth at bedtime. What changed:   how much to take  when to take this  additional instructions   docusate sodium 100 MG capsule Commonly known as: COLACE Take 1 capsule (100 mg total) by mouth at bedtime.   escitalopram 5 MG tablet Commonly known as: LEXAPRO Take 5 mg by mouth daily. Takes along with 10mg  totally 15mg  daily. What changed: Another medication with the same name [name] changed. Make sure you understand how and when to take each.   escitalopram 10 MG tablet Commonly known as: LEXAPRO Take 1.5 tablets (15 mg total) by mouth daily. What changed:   how much to take  additional instructions   fluticasone 110 MCG/ACT inhaler Commonly known as: FLOVENT HFA Inhale 2 puffs into the lungs 2 (two) times daily.   folic acid 1 MG tablet Commonly known as: FOLVITE Take 2 tablets (2 mg total) by mouth at bedtime.   gabapentin 300 MG capsule Commonly known as: NEURONTIN Take 300 mg by mouth 2 (two) times daily.   Glucagon Emergency 1 MG injection Generic drug: glucagon Inject 1 mg into the vein once as needed (low blood sugar).   insulin aspart 100 UNIT/ML injection Commonly known as: novoLOG Inject 0-15 Units into the skin 3 (three) times daily with meals. Per sliding scale   insulin glargine 100 UNIT/ML injection Commonly known as: LANTUS Inject 0.1 mLs (10 Units total) into the skin at  bedtime. What changed: how much to take   [place] 234 MG/1.5ML Susy injection Generic drug: paliperidone Inject 234 mg into the muscle See admin instructions. EVERY 28 DAYS   lamoTRIgine 150 MG tablet Commonly known as: LAMICTAL Take 150 mg by mouth 2 (two) times daily.   lidocaine-prilocaine cream Commonly known as: EMLA Apply 1 application topically 3 (three) times a week. Apply to access site 30 minutes before dialysis. What changed: when to take this   medroxyPROGESTERone 150 MG/ML injection Commonly known as: DEPO-PROVERA Inject 150 mg into the muscle every 3 (three) months.   midodrine 10 MG tablet Commonly known as: PROAMATINE Take 10 mg by mouth See admin instructions. Can be given up to 2 times daily during dialysis treatment   pantoprazole 40 MG tablet Commonly known as: PROTONIX Take 40 mg by mouth daily.   patiromer 8.4 g packet Commonly known as: VELTASSA Take 1 packet (8.4 g total) by mouth daily. What changed:   when to take this  additional instructions   QUEtiapine 100 MG tablet Commonly  known as: SEROQUEL Take 100 mg by mouth at bedtime.   Rena-Vite Rx 1 MG Tabs Take 1 tablet by mouth daily.   Stiolto Respimat 2.5-2.5 MCG/ACT Aers Generic drug: Tiotropium Bromide-Olodaterol Inhale 2 puffs into the lungs daily.   Therems Tabs Take 1 tablet by mouth daily.   traZODone 100 MG tablet Commonly known as: DESYREL Take 100 mg by mouth at bedtime.      Follow-up Information    Corrie Mckusick, DO Follow up.   Specialties: Interventional Radiology, Radiology Why: Schedulers will contact you with date and time of follow-up appointment.  Contact information: [address] [place] [address] [phone]        Felipa Furnace, DPM Follow up.   Specialty: Podiatry Why: Please keep all scheduled appointments with Dr. Glendale Chard information: [address] [place] [address] [phone]             Electronically Signed: Docia Barrier, PA 09/17/2019, 10:46 AM   I have spent Greater Than 30 Minutes discharging Kelly [name].

## 2019-09-21 ENCOUNTER — Ambulatory Visit (INDEPENDENT_AMBULATORY_CARE_PROVIDER_SITE_OTHER): Payer: Medicaid Other | Admitting: Podiatry

## 2019-09-21 ENCOUNTER — Other Ambulatory Visit: Payer: Self-pay

## 2019-09-21 ENCOUNTER — Encounter (HOSPITAL_COMMUNITY): Payer: Self-pay | Admitting: Interventional Radiology

## 2019-09-21 VITALS — Temp 97.7°F

## 2019-09-21 DIAGNOSIS — L97511 Non-pressure chronic ulcer of other part of right foot limited to breakdown of skin: Secondary | ICD-10-CM

## 2019-09-21 DIAGNOSIS — M79671 Pain in right foot: Secondary | ICD-10-CM

## 2019-09-21 DIAGNOSIS — I739 Peripheral vascular disease, unspecified: Secondary | ICD-10-CM

## 2019-09-21 DIAGNOSIS — I96 Gangrene, not elsewhere classified: Secondary | ICD-10-CM

## 2019-09-21 DIAGNOSIS — E119 Type 2 diabetes mellitus without complications: Secondary | ICD-10-CM

## 2019-09-21 NOTE — Progress Notes (Signed)
Subjective:  Patient ID: Kelly [name], female    DOB: 07-26-1981,  MRN: [id number]  Chief Complaint  Patient presents with   Wound Check    Rt great toe: wound care    38 y.o. female presents with the above complaint.  Patient is here today with her caregiver.  Caregiver states that she is doing pretty well.  She had a vascular surgery done by Dr. Earleen Newport last week.  Dr. Jacqualyn Posey was hopeful that AT and PT were restored down to the pedal arch.  She has been having some pain.  However the caregiver states that the color of the skin has greatly improved.  She has also been ambulating a little bit more.  I educated them that that she should be in a cam boot when she ambulates given her forefoot gangrene.   Review of Systems: Negative except as noted in the HPI. Denies N/V/F/Ch.  Past Medical History:  Diagnosis Date   Alcohol abuse    chronic   Anemia    Bipolar disorder ([place])    Chronic kidney disease    Dialysis - M/W/F   Cognitive changes    Cognitive impairment    Depression    Depression    Diabetes mellitus without complication (HCC)    Elevated lipids    Hyperparathyroidism ([place])    Hypertension    Intellectual disability    Staph aureus infection    A/V fistula   UTI (lower urinary tract infection)     Current Outpatient Medications:    acetaminophen (TYLENOL) 325 MG tablet, Take 650 mg by mouth every Monday, Wednesday, and Friday with hemodialysis. Also to be given every 5 hours as needed for pain, Disp: , Rfl:    albuterol (PROVENTIL HFA;VENTOLIN HFA) 108 (90 Base) MCG/ACT inhaler, Inhale 2 puffs into the lungs every 4 (four) hours as needed for wheezing or shortness of breath., Disp: , Rfl:    aspirin 81 MG chewable tablet, Chew 1 tablet (81 mg total) by mouth daily., Disp: 30 tablet, Rfl: 0   B Complex-C-Folic Acid (RENA-VITE RX) 1 MG TABS, Take 1 tablet by mouth daily., Disp: 30 tablet, Rfl: 0   calcium acetate (PHOSLO) 667 MG capsule, Take 2  capsules (1,334 mg total) by mouth 2 (two) times daily with a meal., Disp: 60 capsule, Rfl: 0   cetirizine (ZYRTEC) 10 MG tablet, Take 10 mg by mouth daily., Disp: , Rfl:    chlorhexidine (PERIDEX) 0.12 % solution, Use as directed 15 mLs in the mouth or throat 2 (two) times daily. Rinse for 1 min then spit, Disp: 120 mL, Rfl: 0   cinacalcet (SENSIPAR) 60 MG tablet, Take 1 tablet (60 mg total) by mouth daily. (Patient taking differently: Take 120 mg by mouth daily. ), Disp: 60 tablet, Rfl: 0   clopidogrel (PLAVIX) 75 MG tablet, Take 1 tablet (75 mg total) by mouth daily., Disp: 30 tablet, Rfl: 3   diclofenac sodium (VOLTAREN) 1 % GEL, Apply 4 g topically 2 (two) times daily as needed (pain)., Disp: , Rfl:    divalproex (DEPAKOTE) 500 MG DR tablet, Take 2 tablets (1,000 mg total) by mouth at bedtime. (Patient taking differently: Take 500-1,000 mg by mouth See admin instructions. 500 mg every morning, and 1000 mg at bedtime), Disp: 60 tablet, Rfl: 0   docusate sodium (COLACE) 100 MG capsule, Take 1 capsule (100 mg total) by mouth at bedtime., Disp: 30 capsule, Rfl: 0   escitalopram (LEXAPRO) 10 MG tablet, Take 1.5 tablets (  15 mg total) by mouth daily. (Patient taking differently: Take 10 mg by mouth daily. Take with 5 mg tablet to equal 15 mg daily), Disp: 45 tablet, Rfl: 1   escitalopram (LEXAPRO) 5 MG tablet, Take 5 mg by mouth daily. Takes along with 10mg  totally 15mg  daily., Disp: , Rfl:    fluticasone (FLOVENT HFA) 110 MCG/ACT inhaler, Inhale 2 puffs into the lungs 2 (two) times daily., Disp: , Rfl:    folic acid (FOLVITE) 1 MG tablet, Take 2 tablets (2 mg total) by mouth at bedtime., Disp: 30 tablet, Rfl: 0   gabapentin (NEURONTIN) 300 MG capsule, Take 300 mg by mouth 2 (two) times daily., Disp: , Rfl:    glucagon (GLUCAGON EMERGENCY) 1 MG injection, Inject 1 mg into the vein once as needed (low blood sugar)., Disp: , Rfl:    insulin aspart (NOVOLOG) 100 UNIT/ML injection, Inject 0-15  Units into the skin 3 (three) times daily with meals. Per sliding scale , Disp: , Rfl:    insulin glargine (LANTUS) 100 UNIT/ML injection, Inject 0.1 mLs (10 Units total) into the skin at bedtime. (Patient taking differently: Inject 20 Units into the skin at bedtime. ), Disp: 10 mL, Rfl: 11   lamoTRIgine (LAMICTAL) 150 MG tablet, Take 150 mg by mouth 2 (two) times daily. , Disp: , Rfl:    lidocaine-prilocaine (EMLA) cream, Apply 1 application topically 3 (three) times a week. Apply to access site 30 minutes before dialysis. (Patient taking differently: Apply 1 application topically every Monday, Wednesday, and Friday. Apply to access site 30 minutes before dialysis. ), Disp: 30 g, Rfl: 0   medroxyPROGESTERone (DEPO-PROVERA) 150 MG/ML injection, Inject 150 mg into the muscle every 3 (three) months., Disp: , Rfl:    midodrine (PROAMATINE) 10 MG tablet, Take 10 mg by mouth See admin instructions. Can be given up to 2 times daily during dialysis treatment, Disp: , Rfl:    Multiple Vitamin (THEREMS) TABS, Take 1 tablet by mouth daily., Disp: 30 tablet, Rfl: 0   paliperidone (INVEGA SUSTENNA) 234 MG/1.5ML SUSY injection, Inject 234 mg into the muscle See admin instructions. EVERY 28 DAYS , Disp: , Rfl:    pantoprazole (PROTONIX) 40 MG tablet, Take 40 mg by mouth daily., Disp: , Rfl:    patiromer (VELTASSA) 8.4 g packet, Take 1 packet (8.4 g total) by mouth daily. (Patient taking differently: Take 8.4 g by mouth daily. Mixed into 1/3 cup of drink), Disp: 30 packet, Rfl: 0   QUEtiapine (SEROQUEL) 100 MG tablet, Take 100 mg by mouth at bedtime., Disp: , Rfl:    Tiotropium Bromide-Olodaterol (STIOLTO RESPIMAT) 2.5-2.5 MCG/ACT AERS, Inhale 2 puffs into the lungs daily., Disp: , Rfl:    traZODone (DESYREL) 100 MG tablet, Take 100 mg by mouth at bedtime. , Disp: , Rfl:   Social History   Tobacco Use  Smoking Status Current Every Day Smoker   Packs/day: 0.25   Years: 0.00   Pack years: 0.00    Types: Cigarettes  Smokeless Tobacco Current User    No Known Allergies Objective:   Vitals:   09/21/19 1456  Temp: 97.7 F (36.5 C)   There is no height or weight on file to calculate BMI. Constitutional Well developed. Well nourished.  Vascular Dorsalis pedis pulses palpable bilaterally. Posterior tibial pulses palpable bilaterally. Capillary refill normal to all digits.  No cyanosis or clubbing noted. Pedal hair growth normal.  Neurologic Normal speech. Oriented to person, place, and time. Epicritic sensation to light touch grossly  present bilaterally.  Dermatologic  dry stable gangrene to the right hallux second digit fourth digit fifth digit.  No clinical signs of infection noted at this time.  No malodor noted.  No crepitus noted.  Orthopedic: Normal joint ROM without pain or crepitus bilaterally. No visible deformities. No bony tenderness.   Radiographs: None Assessment:   1. Skin ulcer of right great toe, limited to breakdown of skin ([place])   2. Dry gangrene ([place])   3. PAD (peripheral artery disease) ([place])   4. Foot pain, right   5. Diabetes mellitus without complication ([place])    Plan:  Patient was evaluated and treated and all questions answered.  Right forefoot gangrene dry stable -Dr. Earleen Newport and I have extensively discussed about future treatment options for the patient.  He states that he was able to restore AT and PT down to pedal arch. -At this moment given that the gangrene is dry and stable we are still awaiting demarcation of the gangrene.  It seems that after the vascular intervention there is some regression pattern noted. -I will see her back again in 2 weeks.  She is supposed to have aggressive Betadine wet-to-dry dressing changes daily. -If the dry gangrene becomes acute pain gangrene I instructed the patient to follow-up with the emergency department right away.  Return in about 2 weeks (around 10/05/2019) for Foot Ulcer.

## 2019-10-01 ENCOUNTER — Other Ambulatory Visit: Payer: Self-pay | Admitting: Interventional Radiology

## 2019-10-01 ENCOUNTER — Inpatient Hospital Stay (HOSPITAL_COMMUNITY)
Admission: EM | Admit: 2019-10-01 | Discharge: 2019-10-27 | DRG: 871 | Disposition: E | Payer: Medicaid Other | Attending: Emergency Medicine | Admitting: Emergency Medicine

## 2019-10-01 ENCOUNTER — Inpatient Hospital Stay (HOSPITAL_COMMUNITY): Payer: Medicaid Other

## 2019-10-01 ENCOUNTER — Other Ambulatory Visit (HOSPITAL_COMMUNITY): Payer: Self-pay | Admitting: Student

## 2019-10-01 ENCOUNTER — Emergency Department (HOSPITAL_COMMUNITY): Payer: Medicaid Other

## 2019-10-01 DIAGNOSIS — R40243 Glasgow coma scale score 3-8, unspecified time: Secondary | ICD-10-CM | POA: Diagnosis present

## 2019-10-01 DIAGNOSIS — Z8673 Personal history of transient ischemic attack (TIA), and cerebral infarction without residual deficits: Secondary | ICD-10-CM

## 2019-10-01 DIAGNOSIS — J9601 Acute respiratory failure with hypoxia: Secondary | ICD-10-CM | POA: Diagnosis present

## 2019-10-01 DIAGNOSIS — G931 Anoxic brain damage, not elsewhere classified: Secondary | ICD-10-CM | POA: Diagnosis present

## 2019-10-01 DIAGNOSIS — Z20828 Contact with and (suspected) exposure to other viral communicable diseases: Secondary | ICD-10-CM | POA: Diagnosis present

## 2019-10-01 DIAGNOSIS — E213 Hyperparathyroidism, unspecified: Secondary | ICD-10-CM | POA: Diagnosis present

## 2019-10-01 DIAGNOSIS — I998 Other disorder of circulatory system: Secondary | ICD-10-CM | POA: Diagnosis present

## 2019-10-01 DIAGNOSIS — Z515 Encounter for palliative care: Secondary | ICD-10-CM | POA: Diagnosis not present

## 2019-10-01 DIAGNOSIS — J9602 Acute respiratory failure with hypercapnia: Secondary | ICD-10-CM | POA: Diagnosis present

## 2019-10-01 DIAGNOSIS — F1010 Alcohol abuse, uncomplicated: Secondary | ICD-10-CM | POA: Diagnosis present

## 2019-10-01 DIAGNOSIS — E1159 Type 2 diabetes mellitus with other circulatory complications: Secondary | ICD-10-CM | POA: Diagnosis not present

## 2019-10-01 DIAGNOSIS — E1151 Type 2 diabetes mellitus with diabetic peripheral angiopathy without gangrene: Secondary | ICD-10-CM | POA: Diagnosis present

## 2019-10-01 DIAGNOSIS — G9349 Other encephalopathy: Secondary | ICD-10-CM | POA: Insufficient documentation

## 2019-10-01 DIAGNOSIS — F4325 Adjustment disorder with mixed disturbance of emotions and conduct: Secondary | ICD-10-CM | POA: Diagnosis present

## 2019-10-01 DIAGNOSIS — Z992 Dependence on renal dialysis: Secondary | ICD-10-CM

## 2019-10-01 DIAGNOSIS — R570 Cardiogenic shock: Secondary | ICD-10-CM | POA: Diagnosis present

## 2019-10-01 DIAGNOSIS — I469 Cardiac arrest, cause unspecified: Secondary | ICD-10-CM | POA: Diagnosis present

## 2019-10-01 DIAGNOSIS — E1122 Type 2 diabetes mellitus with diabetic chronic kidney disease: Secondary | ICD-10-CM | POA: Diagnosis present

## 2019-10-01 DIAGNOSIS — Z6841 Body Mass Index (BMI) 40.0 and over, adult: Secondary | ICD-10-CM

## 2019-10-01 DIAGNOSIS — E669 Obesity, unspecified: Secondary | ICD-10-CM | POA: Diagnosis present

## 2019-10-01 DIAGNOSIS — R9431 Abnormal electrocardiogram [ECG] [EKG]: Secondary | ICD-10-CM | POA: Diagnosis present

## 2019-10-01 DIAGNOSIS — F17210 Nicotine dependence, cigarettes, uncomplicated: Secondary | ICD-10-CM | POA: Diagnosis present

## 2019-10-01 DIAGNOSIS — F70 Mild intellectual disabilities: Secondary | ICD-10-CM | POA: Diagnosis present

## 2019-10-01 DIAGNOSIS — Z978 Presence of other specified devices: Secondary | ICD-10-CM | POA: Diagnosis not present

## 2019-10-01 DIAGNOSIS — R579 Shock, unspecified: Secondary | ICD-10-CM | POA: Diagnosis not present

## 2019-10-01 DIAGNOSIS — E874 Mixed disorder of acid-base balance: Secondary | ICD-10-CM

## 2019-10-01 DIAGNOSIS — Z66 Do not resuscitate: Secondary | ICD-10-CM | POA: Diagnosis not present

## 2019-10-01 DIAGNOSIS — I70229 Atherosclerosis of native arteries of extremities with rest pain, unspecified extremity: Secondary | ICD-10-CM

## 2019-10-01 DIAGNOSIS — I4891 Unspecified atrial fibrillation: Secondary | ICD-10-CM | POA: Diagnosis present

## 2019-10-01 DIAGNOSIS — J9600 Acute respiratory failure, unspecified whether with hypoxia or hypercapnia: Secondary | ICD-10-CM

## 2019-10-01 DIAGNOSIS — Z79899 Other long term (current) drug therapy: Secondary | ICD-10-CM

## 2019-10-01 DIAGNOSIS — Z8679 Personal history of other diseases of the circulatory system: Secondary | ICD-10-CM | POA: Diagnosis not present

## 2019-10-01 DIAGNOSIS — I493 Ventricular premature depolarization: Secondary | ICD-10-CM | POA: Diagnosis present

## 2019-10-01 DIAGNOSIS — E872 Acidosis: Secondary | ICD-10-CM | POA: Insufficient documentation

## 2019-10-01 DIAGNOSIS — Z7902 Long term (current) use of antithrombotics/antiplatelets: Secondary | ICD-10-CM

## 2019-10-01 DIAGNOSIS — R34 Anuria and oliguria: Secondary | ICD-10-CM | POA: Diagnosis present

## 2019-10-01 DIAGNOSIS — Z794 Long term (current) use of insulin: Secondary | ICD-10-CM | POA: Diagnosis not present

## 2019-10-01 DIAGNOSIS — I120 Hypertensive chronic kidney disease with stage 5 chronic kidney disease or end stage renal disease: Secondary | ICD-10-CM | POA: Diagnosis present

## 2019-10-01 DIAGNOSIS — I10 Essential (primary) hypertension: Secondary | ICD-10-CM | POA: Diagnosis present

## 2019-10-01 DIAGNOSIS — K922 Gastrointestinal hemorrhage, unspecified: Secondary | ICD-10-CM | POA: Diagnosis present

## 2019-10-01 DIAGNOSIS — R6521 Severe sepsis with septic shock: Secondary | ICD-10-CM | POA: Diagnosis present

## 2019-10-01 DIAGNOSIS — Z9049 Acquired absence of other specified parts of digestive tract: Secondary | ICD-10-CM

## 2019-10-01 DIAGNOSIS — E785 Hyperlipidemia, unspecified: Secondary | ICD-10-CM | POA: Diagnosis present

## 2019-10-01 DIAGNOSIS — A419 Sepsis, unspecified organism: Secondary | ICD-10-CM | POA: Diagnosis present

## 2019-10-01 DIAGNOSIS — Z9911 Dependence on respirator [ventilator] status: Secondary | ICD-10-CM | POA: Diagnosis not present

## 2019-10-01 DIAGNOSIS — N186 End stage renal disease: Secondary | ICD-10-CM | POA: Diagnosis present

## 2019-10-01 DIAGNOSIS — Z993 Dependence on wheelchair: Secondary | ICD-10-CM | POA: Diagnosis not present

## 2019-10-01 DIAGNOSIS — E876 Hypokalemia: Secondary | ICD-10-CM | POA: Diagnosis present

## 2019-10-01 DIAGNOSIS — E119 Type 2 diabetes mellitus without complications: Secondary | ICD-10-CM

## 2019-10-01 DIAGNOSIS — Z7982 Long term (current) use of aspirin: Secondary | ICD-10-CM

## 2019-10-01 DIAGNOSIS — E1165 Type 2 diabetes mellitus with hyperglycemia: Secondary | ICD-10-CM | POA: Diagnosis present

## 2019-10-01 DIAGNOSIS — L8990 Pressure ulcer of unspecified site, unspecified stage: Secondary | ICD-10-CM | POA: Diagnosis not present

## 2019-10-01 DIAGNOSIS — F319 Bipolar disorder, unspecified: Secondary | ICD-10-CM | POA: Diagnosis present

## 2019-10-01 DIAGNOSIS — I739 Peripheral vascular disease, unspecified: Secondary | ICD-10-CM

## 2019-10-01 DIAGNOSIS — E8729 Other acidosis: Secondary | ICD-10-CM | POA: Insufficient documentation

## 2019-10-01 LAB — POCT I-STAT 7, (LYTES, BLD GAS, ICA,H+H)
Acid-Base Excess: 4 mmol/L — ABNORMAL HIGH (ref 0.0–2.0)
Acid-base deficit: 3 mmol/L — ABNORMAL HIGH (ref 0.0–2.0)
Bicarbonate: 24.7 mmol/L (ref 20.0–28.0)
Bicarbonate: 24.7 mmol/L (ref 20.0–28.0)
Calcium, Ion: 1.48 mmol/L — ABNORMAL HIGH (ref 1.15–1.40)
Calcium, Ion: 1.43 mmol/L — ABNORMAL HIGH (ref 1.15–1.40)
HCT: 32 % — ABNORMAL LOW (ref 36.0–46.0)
HCT: 33 % — ABNORMAL LOW (ref 36.0–46.0)
Hemoglobin: 10.9 g/dL — ABNORMAL LOW (ref 12.0–15.0)
Hemoglobin: 11.2 g/dL — ABNORMAL LOW (ref 12.0–15.0)
O2 Saturation: 99 %
O2 Saturation: 100 %
Patient temperature: 94.6
Potassium: 3.2 mmol/L — ABNORMAL LOW (ref 3.5–5.1)
Potassium: 3.1 mmol/L — ABNORMAL LOW (ref 3.5–5.1)
Sodium: 136 mmol/L (ref 135–145)
Sodium: 136 mmol/L (ref 135–145)
TCO2: 26 mmol/L (ref 22–32)
TCO2: 25 mmol/L (ref 22–32)
pCO2 arterial: 58 mmHg — ABNORMAL HIGH (ref 32.0–48.0)
pCO2 arterial: 22.2 mmHg — ABNORMAL LOW (ref 32.0–48.0)
pH, Arterial: 7.237 — ABNORMAL LOW (ref 7.350–7.450)
pH, Arterial: 7.649 (ref 7.350–7.450)
pO2, Arterial: 196 mmHg — ABNORMAL HIGH (ref 83.0–108.0)
pO2, Arterial: 197 mmHg — ABNORMAL HIGH (ref 83.0–108.0)

## 2019-10-01 LAB — CORTISOL: Cortisol, Plasma: 26.1 ug/dL

## 2019-10-01 LAB — CBC
HCT: 37.3 % (ref 36.0–46.0)
Hemoglobin: 10 g/dL — ABNORMAL LOW (ref 12.0–15.0)
MCH: 28.7 pg (ref 26.0–34.0)
MCHC: 26.8 g/dL — ABNORMAL LOW (ref 30.0–36.0)
MCV: 107.2 fL — ABNORMAL HIGH (ref 80.0–100.0)
Platelets: 569 10*3/uL — ABNORMAL HIGH (ref 150–400)
RBC: 3.48 MIL/uL — ABNORMAL LOW (ref 3.87–5.11)
RDW: 15.8 % — ABNORMAL HIGH (ref 11.5–15.5)
WBC: 22.4 10*3/uL — ABNORMAL HIGH (ref 4.0–10.5)
nRBC: 0.5 % — ABNORMAL HIGH (ref 0.0–0.2)

## 2019-10-01 LAB — I-STAT CHEM 8, ED
BUN: 19 mg/dL (ref 6–20)
Calcium, Ion: 1.45 mmol/L — ABNORMAL HIGH (ref 1.15–1.40)
Chloride: 98 mmol/L (ref 98–111)
Creatinine, Ser: 4.8 mg/dL — ABNORMAL HIGH (ref 0.44–1.00)
Glucose, Bld: 210 mg/dL — ABNORMAL HIGH (ref 70–99)
HCT: 35 % — ABNORMAL LOW (ref 36.0–46.0)
Hemoglobin: 11.9 g/dL — ABNORMAL LOW (ref 12.0–15.0)
Potassium: 3 mmol/L — ABNORMAL LOW (ref 3.5–5.1)
Sodium: 137 mmol/L (ref 135–145)
TCO2: 23 mmol/L (ref 22–32)

## 2019-10-01 LAB — PROTIME-INR
INR: 1.3 — ABNORMAL HIGH (ref 0.8–1.2)
Prothrombin Time: 15.8 seconds — ABNORMAL HIGH (ref 11.4–15.2)

## 2019-10-01 LAB — TROPONIN I (HIGH SENSITIVITY)
Troponin I (High Sensitivity): 20 ng/L — ABNORMAL HIGH (ref <18)
Troponin I (High Sensitivity): 42 ng/L — ABNORMAL HIGH (ref <18)

## 2019-10-01 LAB — COMPREHENSIVE METABOLIC PANEL
ALT: 106 U/L — ABNORMAL HIGH (ref 0–44)
AST: 356 U/L — ABNORMAL HIGH (ref 15–41)
Albumin: 1.7 g/dL — ABNORMAL LOW (ref 3.5–5.0)
Alkaline Phosphatase: 114 U/L (ref 38–126)
Anion gap: 26 — ABNORMAL HIGH (ref 5–15)
BUN: 18 mg/dL (ref 6–20)
CO2: 17 mmol/L — ABNORMAL LOW (ref 22–32)
Calcium: 11 mg/dL — ABNORMAL HIGH (ref 8.9–10.3)
Chloride: 95 mmol/L — ABNORMAL LOW (ref 98–111)
Creatinine, Ser: 5.39 mg/dL — ABNORMAL HIGH (ref 0.44–1.00)
GFR calc Af Amer: 11 mL/min — ABNORMAL LOW (ref >60)
GFR calc non Af Amer: 9 mL/min — ABNORMAL LOW (ref >60)
Glucose, Bld: 219 mg/dL — ABNORMAL HIGH (ref 70–99)
Potassium: 3.1 mmol/L — ABNORMAL LOW (ref 3.5–5.1)
Sodium: 138 mmol/L (ref 135–145)
Total Bilirubin: 0.5 mg/dL (ref 0.3–1.2)
Total Protein: 6.1 g/dL — ABNORMAL LOW (ref 6.5–8.1)

## 2019-10-01 LAB — I-STAT BETA HCG BLOOD, ED (MC, WL, AP ONLY): I-stat hCG, quantitative: 16.5 m[IU]/mL — ABNORMAL HIGH (ref <5)

## 2019-10-01 LAB — CBG MONITORING, ED
Glucose-Capillary: 186 mg/dL — ABNORMAL HIGH (ref 70–99)
Glucose-Capillary: 190 mg/dL — ABNORMAL HIGH (ref 70–99)
Glucose-Capillary: 197 mg/dL — ABNORMAL HIGH (ref 70–99)

## 2019-10-01 LAB — ETHANOL
Alcohol, Ethyl (B): <10 mg/dL (ref <10)
Alcohol, Ethyl (B): <10 mg/dL (ref <10)

## 2019-10-01 LAB — LACTIC ACID, PLASMA
Lactic Acid, Venous: 10.6 mmol/L (ref 0.5–1.9)
Lactic Acid, Venous: 4.3 mmol/L (ref 0.5–1.9)

## 2019-10-01 LAB — VALPROIC ACID LEVEL: Valproic Acid Lvl: 17 ug/mL — ABNORMAL LOW (ref 50.0–100.0)

## 2019-10-01 LAB — PHOSPHORUS: Phosphorus: 5.2 mg/dL — ABNORMAL HIGH (ref 2.5–4.6)

## 2019-10-01 LAB — I-STAT CREATININE, ED: Creatinine, Ser: 4.5 mg/dL — ABNORMAL HIGH (ref 0.44–1.00)

## 2019-10-01 LAB — SARS CORONAVIRUS 2 BY RT PCR (HOSPITAL ORDER, PERFORMED IN ~~LOC~~ HOSPITAL LAB): SARS Coronavirus 2: NEGATIVE

## 2019-10-01 LAB — MAGNESIUM: Magnesium: 3.3 mg/dL — ABNORMAL HIGH (ref 1.7–2.4)

## 2019-10-01 MED ORDER — VALPROATE SODIUM 500 MG/5ML IV SOLN
1000.0000 mg | Freq: Every evening | INTRAVENOUS | Status: DC
  Filled 2019-10-01: qty 10

## 2019-10-01 MED ORDER — MIDODRINE HCL 5 MG PO TABS
10.0000 mg | ORAL_TABLET | Freq: Three times a day (TID) | ORAL | Status: DC
  Administered 2019-10-02 – 2019-10-03 (×6): 10 mg
  Filled 2019-10-01 (×7): qty 2

## 2019-10-01 MED ORDER — NOREPINEPHRINE 4 MG/250ML-% IV SOLN
0.0000 ug/min | INTRAVENOUS | Status: DC
  Administered 2019-10-01: 21:00:00 5 ug/min via INTRAVENOUS
  Administered 2019-10-01: 19:00:00 30 ug/min via INTRAVENOUS
  Administered 2019-10-02: 4 ug/min via INTRAVENOUS
  Filled 2019-10-01 (×2): qty 250

## 2019-10-01 MED ORDER — CALCIUM GLUCONATE 10 % IV SOLN
1.0000 g | Freq: Once | INTRAVENOUS | Status: AC
  Administered 2019-10-01: 19:00:00 1 g via INTRAVENOUS
  Filled 2019-10-01: qty 10

## 2019-10-01 MED ORDER — NOREPINEPHRINE BITARTRATE 1 MG/ML IV SOLN
INTRAVENOUS | Status: AC | PRN
  Administered 2019-10-01: 5 ug/kg/min via INTRAVENOUS

## 2019-10-01 MED ORDER — CLOPIDOGREL BISULFATE 75 MG PO TABS
75.0000 mg | ORAL_TABLET | Freq: Every day | ORAL | Status: DC
  Administered 2019-10-02 – 2019-10-03 (×2): 75 mg
  Filled 2019-10-01 (×2): qty 1

## 2019-10-01 MED ORDER — VANCOMYCIN HCL IN DEXTROSE 1-5 GM/200ML-% IV SOLN
1000.0000 mg | INTRAVENOUS | Status: DC
  Filled 2019-10-01: qty 200

## 2019-10-01 MED ORDER — MIDAZOLAM HCL 2 MG/2ML IJ SOLN
2.0000 mg | INTRAMUSCULAR | Status: DC | PRN
  Administered 2019-10-03: 2 mg via INTRAVENOUS
  Filled 2019-10-01: qty 2

## 2019-10-01 MED ORDER — HYDROCORTISONE NA SUCCINATE PF 100 MG IJ SOLR
50.0000 mg | Freq: Four times a day (QID) | INTRAMUSCULAR | Status: DC
  Administered 2019-10-02 – 2019-10-03 (×7): 50 mg via INTRAVENOUS
  Filled 2019-10-01 (×7): qty 2

## 2019-10-01 MED ORDER — HYDROCORTISONE NA SUCCINATE PF 100 MG IJ SOLR
100.0000 mg | Freq: Once | INTRAMUSCULAR | Status: AC
  Administered 2019-10-01: 100 mg via INTRAVENOUS
  Filled 2019-10-01: qty 2

## 2019-10-01 MED ORDER — POTASSIUM CHLORIDE 20 MEQ/15ML (10%) PO SOLN
40.0000 meq | Freq: Once | ORAL | Status: AC
  Administered 2019-10-01: 40 meq
  Filled 2019-10-01: qty 30

## 2019-10-01 MED ORDER — VALPROATE SODIUM 500 MG/5ML IV SOLN
250.0000 mg | Freq: Four times a day (QID) | INTRAVENOUS | Status: DC
  Administered 2019-10-01 – 2019-10-03 (×7): 250 mg via INTRAVENOUS
  Filled 2019-10-01 (×15): qty 2.5

## 2019-10-01 MED ORDER — THIAMINE HCL 100 MG/ML IJ SOLN
100.0000 mg | INTRAMUSCULAR | Status: DC
  Administered 2019-10-01 – 2019-10-02 (×2): 100 mg via INTRAVENOUS
  Filled 2019-10-01 (×2): qty 2

## 2019-10-01 MED ORDER — MIDAZOLAM HCL 2 MG/2ML IJ SOLN: 2.0000 mg | INTRAMUSCULAR | Status: DC | PRN

## 2019-10-01 MED ORDER — FOLIC ACID 5 MG/ML IJ SOLN
1.0000 mg | Freq: Every day | INTRAMUSCULAR | Status: DC
  Administered 2019-10-01 – 2019-10-03 (×3): 1 mg via INTRAVENOUS
  Filled 2019-10-01 (×3): qty 0.2

## 2019-10-01 MED ORDER — PANTOPRAZOLE SODIUM 40 MG IV SOLR
40.0000 mg | Freq: Two times a day (BID) | INTRAVENOUS | Status: DC
  Administered 2019-10-01 – 2019-10-03 (×4): 40 mg via INTRAVENOUS
  Filled 2019-10-01 (×3): qty 40

## 2019-10-01 MED ORDER — VALPROATE SODIUM 500 MG/5ML IV SOLN: 500.0000 mg | Freq: Every morning | INTRAVENOUS | Status: DC

## 2019-10-01 MED ORDER — HEPARIN SODIUM (PORCINE) 5000 UNIT/ML IJ SOLN
5000.0000 [IU] | Freq: Three times a day (TID) | INTRAMUSCULAR | Status: DC
  Administered 2019-10-01 – 2019-10-03 (×6): 5000 [IU] via SUBCUTANEOUS
  Filled 2019-10-01 (×6): qty 1

## 2019-10-01 MED ORDER — PIPERACILLIN-TAZOBACTAM 3.375 G IVPB
3.3750 g | Freq: Two times a day (BID) | INTRAVENOUS | Status: DC
  Filled 2019-10-01: qty 50

## 2019-10-01 MED ORDER — PANTOPRAZOLE SODIUM 40 MG IV SOLR
40.0000 mg | Freq: Every day | INTRAVENOUS | Status: DC
  Filled 2019-10-01: qty 40

## 2019-10-01 MED ORDER — SODIUM CHLORIDE 0.9 % IV SOLN
INTRAVENOUS | Status: DC
  Administered 2019-10-01 – 2019-10-02 (×2): via INTRAVENOUS

## 2019-10-01 MED ORDER — VANCOMYCIN HCL 10 G IV SOLR
2000.0000 mg | Freq: Once | INTRAVENOUS | Status: AC
  Administered 2019-10-01: 2000 mg via INTRAVENOUS
  Filled 2019-10-01: qty 2000

## 2019-10-01 MED ORDER — MAGNESIUM SULFATE 2 GM/50ML IV SOLN
2.0000 g | Freq: Once | INTRAVENOUS | Status: AC
  Administered 2019-10-01: 2 g via INTRAVENOUS
  Filled 2019-10-01: qty 50

## 2019-10-01 MED ORDER — HEPARIN 1000 UNIT/ML FOR PERITONEAL DIALYSIS
3.2000 mL | Freq: Once | INTRAMUSCULAR | Status: AC
  Administered 2019-10-01: 3200 [IU] via INTRAPERITONEAL

## 2019-10-01 MED ORDER — LAMOTRIGINE 150 MG PO TABS
150.0000 mg | ORAL_TABLET | Freq: Two times a day (BID) | ORAL | Status: DC
  Administered 2019-10-02 – 2019-10-03 (×3): 150 mg
  Filled 2019-10-01 (×4): qty 1

## 2019-10-01 MED ORDER — ASPIRIN 81 MG PO CHEW
81.0000 mg | CHEWABLE_TABLET | Freq: Every day | ORAL | Status: DC
  Administered 2019-10-01 – 2019-10-03 (×3): 81 mg
  Filled 2019-10-01 (×3): qty 1

## 2019-10-01 MED ORDER — INSULIN ASPART 100 UNIT/ML ~~LOC~~ SOLN
0.0000 [IU] | SUBCUTANEOUS | Status: DC
  Administered 2019-10-01 – 2019-10-02 (×3): 2 [IU] via SUBCUTANEOUS
  Administered 2019-10-02 (×3): 1 [IU] via SUBCUTANEOUS
  Administered 2019-10-03 (×3): 2 [IU] via SUBCUTANEOUS

## 2019-10-01 MED ORDER — FENTANYL CITRATE (PF) 100 MCG/2ML IJ SOLN: 50.0000 ug | INTRAMUSCULAR | Status: DC | PRN

## 2019-10-01 MED ORDER — SODIUM CHLORIDE 0.9 % IV BOLUS
1000.0000 mL | Freq: Once | INTRAVENOUS | Status: AC
  Administered 2019-10-01: 1000 mL via INTRAVENOUS

## 2019-10-01 MED ORDER — PIPERACILLIN-TAZOBACTAM IN DEX 2-0.25 GM/50ML IV SOLN
2.2500 g | Freq: Three times a day (TID) | INTRAVENOUS | Status: DC
  Administered 2019-10-01 – 2019-10-03 (×6): 2.25 g via INTRAVENOUS
  Filled 2019-10-01 (×10): qty 50

## 2019-10-01 MED ORDER — DOCUSATE SODIUM 50 MG/5ML PO LIQD
100.0000 mg | Freq: Two times a day (BID) | ORAL | Status: DC | PRN
  Filled 2019-10-01: qty 10

## 2019-10-01 MED ORDER — HEPARIN SODIUM (PORCINE) 1000 UNIT/ML IJ SOLN
3.2000 mL | Freq: Once | INTRAMUSCULAR | Status: AC
  Administered 2019-10-01: 23:00:00 3200 [IU]

## 2019-10-01 NOTE — Progress Notes (Signed)
Assisted in transport to CT and back to pt room.

## 2019-10-01 NOTE — Procedures (Signed)
Hemodialysis Catheter Insertion Procedure Note Kelly [name] [id number] 10-Aug-1981  Procedure: Insertion of Central Venous Catheter Indications: Drug and/or fluid administration, expected need for CRRT  Procedure Details Consent: Unable to obtain consent because of emergent medical necessity. Time Out: Verified patient identification, verified procedure, site/side was marked, verified correct patient position, special equipment/implants available, medications/allergies/relevent history reviewed, required imaging and test results available.  Performed  Maximum sterile technique was used including antiseptics, cap, gloves, gown, hand hygiene, mask and sheet. Skin prep: Chlorhexidine; local anesthetic administered A antimicrobial bonded/coated triple lumen catheter was placed in the left femoral vein. Longstanding HD patient. RIJ, LIJ, and R fem assessed with ultrasound and would prove to be very difficult access.  Catheter placed to 24 cm. Blood aspirated via all 3 ports and then flushed x 3. Line sutured x 2 and dressing applied.  Ultrasound guidance used.Yes.    Evaluation Blood flow good Complications: No apparent complications Patient did tolerate procedure well. Chest X-ray ordered to verify placement.  CXR: NA.   Georgann Housekeeper, AGACNP-BC [place] Pager [phone] or [phone]  10/02/2019 9:54 PM

## 2019-10-01 NOTE — ED Triage Notes (Signed)
Pt bib ems after unwitnessed cardiac arrest. LKW 1430. Pt found by staff at group home slumped over in wheel chair. Pt asystole initially with EMS. Given 3 epi with ROSC. Pt was then afib RVR rate 120-150. Lost pulses again with rhythm showing torsades. Given magnesium and 2 more epi with ROS. Pt arrives with king airway in place.

## 2019-10-01 NOTE — H&P (Signed)
NAME:  Kelly [name], MRN:  [id number], DOB:  1980-11-30, LOS: 0 ADMISSION DATE:  10/16/2019, CONSULTATION DATE:  10/26/2019 REFERRING MD:  Dr. Sherril Croon, CHIEF COMPLAINT:  Cardiac arrest    Brief History   38 year old female found down pulseless after cardiac arrest by at local group staff, unknown down time. CPR initiated by EMS with eventual ROSC.   History of present illness   38 yo F with history extensive medical history including cognitive impairment and ESRD presented to the emergency department via EMS after unwitnessed cardiac arrest, patient lives at group home was found by staff slumped over in chair. CPR initiated by EMS with 3 rounds of epi given with ROSC achieved, cardiac rhythm at that time was A-fib RVR rate of 120s to 150s. Shortly after ROSC patient lost pulses again with rhythm showing torsades, she received magnesium and 2 more rounds of epinephrine with again ROSC achieved.  Lab work on admission with mild hypokalemia hyperglycemia, elevated BUN and creatinine consistent with end-stage renal disease, leukocytosis and thrombocytosis.  Chest x-ray with good position of ET tube and volumes.  CT scan head pending.  PCCM consulted for admission.   Past Medical History  ESRD on iHD MWF Cognitively impaired  Hyperparathyroidism Hyperlipidemia Type 2 diabetes Depression Anemia Bipolar disease Alcohol abuse  Significant Hospital Events   11/5 >> admitted after cardiac arrest, intubated  Consults:  PCCM  Procedures:  ETT 11/5 >>  Significant Diagnostic Tests:  CXR 11/5 >> Chest x-ray with good position of ET tube and volumes  Micro Data:  COVID 11/5 >>  Urine culture 11/5 >> Blood cultures 11/5 >> Respiratory culture 11/5 >>  Antimicrobials:    Interim history/subjective:  Unresponsive on ventilator  Objective   Blood pressure (!) 89/47, pulse (!) 106, temperature (!) 95.2 F (35.1 C), temperature source Temporal, resp. rate 16, height 5\' 2"  (1.575 m),  weight 101 kg, SpO2 100 %.    Vent Mode: PRVC FiO2 (%):  [100 %] 100 % Set Rate:  [16 bmp] 16 bmp Vt Set:  [400 mL] 400 mL PEEP:  [5 cmH20] 5 cmH20 Plateau Pressure:  [20 cmH20] 20 cmH20  No intake or output data in the 24 hours ending 10/15/2019 1758 Filed Weights   10/08/2019 1712  Weight: 101 kg    Examination: General: Adult female on mechanical ventilation, in NAD HEENT: ETT, MM pink/moist, pupils 58mm with sluggish response  Neuro: Unresponsive CV: s1s2 regular rate and rhythm, no murmur, rubs, or gallops,  PULM:  Diminished breath sounds with faint rhonchi in upper lobes  GI: soft, bowel sounds active in all 4 quadrants, non-tender, non-distended, OG to LIWS Extremities: warm/dry, no edema  Skin: no rashes or lesions   Resolved Hospital Problem list     Assessment & Plan:  Asystolic Cardiac Arrest - unknown downtime.  Unclear etiology at this point.  Possible 2/2 electrolyte derangements given initial K of 3 (3.2 on repeat) and reported torsades after 2nd arrest. P: - Defer TTM given unknown downtime.  Avoid fevers. - Assess electrolytes. - 2g Mag empirically. - Assess echo, troponins. - Trend lactate. - If no improvement within next 24-48 consider comfort measures   - Follow up with CT head  Shock - presumed 2/2 above +/- sepsis. - Continue levophed as needed for goal MAP >65. - Assess cortisol then empiric stress steroids for now. - Continue preadmission midodrine   ESRD on HD MWF. - Day team to please consult nephrology in AM 11/6.  Hypocalcemia/Hypokalemai. -  1g Ca gluconate. - Supplemental potassium given   Hx HTN, HLD, PVD. - Continue preadmission clopidogrel, ASA.  Hx DM. - SSI. - Hold preadmission novolog, lantus.  Hx depression, cognitive impairment, bipolar disorder, EtOH abuse. - Continue preadmission divalproex, lamotrigine. - Hold preadmission escitalopram, gabapentin, quetiapine, trazodone.   Best practice:  Diet: NPO, consider  initiation of TF within next 24 hrs  Pain/Anxiety/Delirium protocol (if indicated): PRN fentanyl  VAP protocol (if indicated): In place  DVT prophylaxis: SCD GI prophylaxis: PPI Glucose control: SSI Mobility: Bedrest Code Status: Full Family Communication: Have attempted to contact group home to speak with legal guardian, [name], with no contact made  Disposition: ICU  Labs   CBC: Recent Labs  Lab 10/02/2019 1710 09/30/2019 1740  WBC  --  22.4*  HGB 11.9* 10.0*  HCT 35.0* 37.3  MCV  --  107.2*  PLT  --  569*    Basic Metabolic Panel: Recent Labs  Lab 09/28/2019 1710 10/13/2019 1715  NA 137  --   K 3.0*  --   CL 98  --   GLUCOSE 210*  --   BUN 19  --   CREATININE 4.80* 4.50*   GFR: Estimated Creatinine Clearance: 19.1 mL/min (A) (by C-G formula based on SCr of 4.5 mg/dL (H)). Recent Labs  Lab 09/30/2019 1740  WBC 22.4*    Liver Function Tests: No results for input(s): AST, ALT, ALKPHOS, BILITOT, PROT, ALBUMIN in the last 168 hours. No results for input(s): LIPASE, AMYLASE in the last 168 hours. No results for input(s): AMMONIA in the last 168 hours.  ABG    Component Value Date/Time   HCO3 34.2 (H) 08/08/2015 0051   TCO2 23 10/13/2019 1710     Coagulation Profile: No results for input(s): INR, PROTIME in the last 168 hours.  Cardiac Enzymes: No results for input(s): CKTOTAL, CKMB, CKMBINDEX, TROPONINI in the last 168 hours.  HbA1C: Hemoglobin A1C  Date/Time Value Ref Range Status  10/07/2012 04:08 AM 5.7 4.2 - 6.3 % Final    Comment:    The American Diabetes Association recommends that a primary goal of therapy should be <7% and that physicians should reevaluate the treatment regimen in patients with HbA1c values consistently >8%.    Hgb A1c MFr Bld  Date/Time Value Ref Range Status  05/25/2019 12:26 AM 6.3 (H) 4.8 - 5.6 % Final    Comment:    (NOTE) Pre diabetes:          5.7%-6.4% Diabetes:              >6.4% Glycemic control for   <7.0%  adults with diabetes     CBG: Recent Labs  Lab 10/15/2019 1714  GLUCAP 197*    Review of Systems:   Unable to obtain given unresponsives and current ventilator support   Past Medical History  She,  has a past medical history of Alcohol abuse, Anemia, Bipolar disorder ([place]), Chronic kidney disease, Cognitive changes, Cognitive impairment, Depression, Depression, Diabetes mellitus without complication ([place]), Elevated lipids, Hyperparathyroidism ([place]), Hypertension, Intellectual disability, Staph aureus infection, and UTI (lower urinary tract infection).    Surgical History    Past Surgical History:  Procedure Laterality Date  . A/V SHUNTOGRAM Left 10/07/2018   Procedure: A/V SHUNTOGRAM;  Surgeon: Katha Cabal, MD;  Location: [facility];  Service: Cardiovascular;  Laterality: Left;  . AV FISTULA PLACEMENT    . CHOLECYSTECTOMY    . dialysis perma cath Right    chest  .  IR ANGIOGRAM EXTREMITY RIGHT  09/16/2019  . IR RADIOLOGIST EVAL & MGMT  09/03/2019  . IR TIB-PERO ART PTA MOD SED  09/16/2019  . IR TIB-PERO ART UNI PTA EA ADD VESSEL MOD SED  09/16/2019  . IR US GUIDE VASC ACCESS RIGHT  09/16/2019  . PERIPHERAL VASCULAR CATHETERIZATION N/A 05/24/2015   Procedure: Dialysis/Perma Catheter Removal;  Surgeon: Katha Cabal, MD;  Location: [facility];  Service: Cardiovascular;  Laterality: N/A;  . PERIPHERAL VASCULAR CATHETERIZATION Left 02/28/2016   Procedure: A/V Shuntogram/Fistulagram;  Surgeon: Katha Cabal, MD;  Location: [facility];  Service: Cardiovascular;  Laterality: Left;  . PERIPHERAL VASCULAR CATHETERIZATION N/A 02/28/2016   Procedure: A/V Shunt Intervention;  Surgeon: Katha Cabal, MD;  Location: [facility];  Service: Cardiovascular;  Laterality: N/A;  . PERIPHERAL VASCULAR CATHETERIZATION N/A 06/26/2016   Procedure: A/V Shuntogram/Fistulagram;  Surgeon: Katha Cabal, MD;  Location: [facility];  Service:  Cardiovascular;  Laterality: N/A;  . PERIPHERAL VASCULAR CATHETERIZATION N/A 09/10/2016   Procedure: A/V Shuntogram/Fistulagram;  Surgeon: Algernon Huxley, MD;  Location: [facility];  Service: Cardiovascular;  Laterality: N/A;  . PERIPHERAL VASCULAR CATHETERIZATION Left 09/14/2016   Procedure: Thrombectomy;  Surgeon: Katha Cabal, MD;  Location: [facility];  Service: Cardiovascular;  Laterality: Left;  . TIBIA IM NAIL INSERTION Right 06/17/2019   Procedure: INTRAMEDULLARY (IM) NAIL TIBIAL;  Surgeon: Nicholes Stairs, MD;  Location: [facility];  Service: Orthopedics;  Laterality: Right;  2.5 hrs  . VASCULAR ACCESS DEVICE INSERTION Left 04/22/2015   Procedure: INSERTION OF HERO VASCULAR ACCESS DEVICE;  Surgeon: Katha Cabal, MD;  Location: [facility];  Service: Vascular;  Laterality: Left;     Social History   reports that she has been smoking cigarettes. She has been smoking about 0.25 packs per day for the past 0.00 years. She uses smokeless tobacco. She reports that she does not drink alcohol or use drugs.    Family History   Her Family history is unknown by patient.    Allergies No Known Allergies    Home Medications  Prior to Admission medications   Medication Sig Start Date End Date Taking? Authorizing Provider  acetaminophen (TYLENOL) 325 MG tablet Take 650 mg by mouth every Monday, Wednesday, and Friday with hemodialysis. Also to be given every 5 hours as needed for pain    [provider]  albuterol (PROVENTIL HFA;VENTOLIN HFA) 108 (90 Base) MCG/ACT inhaler Inhale 2 puffs into the lungs every 4 (four) hours as needed for wheezing or shortness of breath.    [provider]  aspirin 81 MG chewable tablet Chew 1 tablet (81 mg total) by mouth daily. 10/02/16   Clapacs, Madie Reno, MD  B Complex-C-Folic Acid (RENA-VITE RX) 1 MG TABS Take 1 tablet by mouth daily. 10/01/16   Clapacs, Madie Reno, MD  calcium acetate (PHOSLO) 667 MG capsule Take 2 capsules  (1,334 mg total) by mouth 2 (two) times daily with a meal. 10/01/16   Clapacs, Madie Reno, MD  cetirizine (ZYRTEC) 10 MG tablet Take 10 mg by mouth daily.    [provider]  chlorhexidine (PERIDEX) 0.12 % solution Use as directed 15 mLs in the mouth or throat 2 (two) times daily. Rinse for 1 min then spit 02/01/19   Wieters, [employer] C, PA-C  cinacalcet (SENSIPAR) 60 MG tablet Take 1 tablet (60 mg total) by mouth daily. Patient taking differently: Take 120 mg by  mouth daily.  10/01/16   Clapacs, Madie Reno, MD  clopidogrel (PLAVIX) 75 MG tablet Take 1 tablet (75 mg total) by mouth daily. 09/17/19   Docia Barrier, PA  diclofenac sodium (VOLTAREN) 1 % GEL Apply 4 g topically 2 (two) times daily as needed (pain).    [provider]  divalproex (DEPAKOTE) 500 MG DR tablet Take 2 tablets (1,000 mg total) by mouth at bedtime. Patient taking differently: Take 500-1,000 mg by mouth See admin instructions. 500 mg every morning, and 1000 mg at bedtime 10/01/16   Clapacs, Madie Reno, MD  docusate sodium (COLACE) 100 MG capsule Take 1 capsule (100 mg total) by mouth at bedtime. 10/01/16   Clapacs, Madie Reno, MD  escitalopram (LEXAPRO) 10 MG tablet Take 1.5 tablets (15 mg total) by mouth daily. Patient taking differently: Take 10 mg by mouth daily. Take with 5 mg tablet to equal 15 mg daily 10/02/16   Clapacs, Madie Reno, MD  escitalopram (LEXAPRO) 5 MG tablet Take 5 mg by mouth daily. Takes along with 10mg  totally 15mg  daily.    [provider]  fluticasone (FLOVENT HFA) 110 MCG/ACT inhaler Inhale 2 puffs into the lungs 2 (two) times daily.    [provider]  folic acid (FOLVITE) 1 MG tablet Take 2 tablets (2 mg total) by mouth at bedtime. 10/01/16   Clapacs, Madie Reno, MD  gabapentin (NEURONTIN) 300 MG capsule Take 300 mg by mouth 2 (two) times daily.    [provider]  glucagon (GLUCAGON EMERGENCY) 1 MG injection Inject 1 mg into the vein once as needed (low blood sugar).     [provider]  insulin aspart (NOVOLOG) 100 UNIT/ML injection Inject 0-15 Units into the skin 3 (three) times daily with meals. Per sliding scale     [provider]  insulin glargine (LANTUS) 100 UNIT/ML injection Inject 0.1 mLs (10 Units total) into the skin at bedtime. Patient taking differently: Inject 20 Units into the skin at bedtime.  06/01/19   Georgette Shell, MD  lamoTRIgine (LAMICTAL) 150 MG tablet Take 150 mg by mouth 2 (two) times daily.     [provider]  lidocaine-prilocaine (EMLA) cream Apply 1 application topically 3 (three) times a week. Apply to access site 30 minutes before dialysis. Patient taking differently: Apply 1 application topically every Monday, Wednesday, and Friday. Apply to access site 30 minutes before dialysis.  10/01/16   Clapacs, Madie Reno, MD  medroxyPROGESTERone (DEPO-PROVERA) 150 MG/ML injection Inject 150 mg into the muscle every 3 (three) months.    [provider]  midodrine (PROAMATINE) 10 MG tablet Take 10 mg by mouth See admin instructions. Can be given up to 2 times daily during dialysis treatment    [provider]  Multiple Vitamin (THEREMS) TABS Take 1 tablet by mouth daily. 10/01/16   Clapacs, Madie Reno, MD  paliperidone (INVEGA SUSTENNA) 234 MG/1.5ML SUSY injection Inject 234 mg into the muscle See admin instructions. EVERY 28 DAYS     [provider]  pantoprazole (PROTONIX) 40 MG tablet Take 40 mg by mouth daily.    [provider]  patiromer (VELTASSA) 8.4 g packet Take 1 packet (8.4 g total) by mouth daily. Patient taking differently: Take 8.4 g by mouth daily. Mixed into 1/3 cup of drink 10/01/16   Clapacs, Madie Reno, MD  QUEtiapine (SEROQUEL) 100 MG tablet Take 100 mg by mouth at bedtime.    [provider]  Tiotropium Bromide-Olodaterol (STIOLTO RESPIMAT) 2.5-2.5 MCG/ACT  AERS Inhale 2 puffs into the lungs daily.    [provider]  traZODone (DESYREL) 100 MG tablet  Take 100 mg by mouth at bedtime.     [provider]     Critical care time:    Performed by: Johnsie Cancel   Total critical care time: 45 minutes  Critical care time was exclusive of separately billable procedures and treating other patients.  Critical care was necessary to treat or prevent imminent or life-threatening deterioration.  Critical care was time spent personally by me on the following activities: development of treatment plan with patient and/or surrogate as well as nursing, discussions with consultants, evaluation of patient's response to treatment, examination of patient, obtaining history from patient or surrogate, ordering and performing treatments and interventions, ordering and review of laboratory studies, ordering and review of radiographic studies, pulse oximetry and re-evaluation of patient's condition.  Signature:    Johnsie Cancel, NP-C [facility] After hours pager: [phone]. 10/22/2019, 6:34 PM

## 2019-10-01 NOTE — Progress Notes (Signed)
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  Venous dopplers in Sept 2020 showed: Right: Medial calcification in the common femoral, femoral, popliteal and tibial arteries. Occlusion of the posterior tibial and peroneal arteries. Left: Medial calcification in the common femoral, femoral, popliteal and tibial arteries. 30-49% stenosis in the superficial femoral artery. Occlusion of the posterior tibial and peroneal arteries.  Physical Exam: Unwrapped the right lower extremity: on inspection there are gangrenous changes at right great toe and lateral surface of the right foot. Hypertrophic changes on the dorsum and some skin breakdown. Concerning for limb ischemia.   [place] evaluation for access [place] evaluation of the RIJ shows very small IJ [place] evaluation of the Right femoral shows small CFV Left IJ not an option for access as Pt has a left subclavian HeRO Graft for  hemodialysis access.  [place] evaluation shows left CF looks good patent no clot, compressible  Current Peripheral access: LLE IO in place and 20 gauge in the Right [facility]  Vasopressor requirements have decreased to levophed @ 49mcg with MAPs >39mmHg.  Plan: 1. Continue with current peripheral access as Vasopressor requiriements have decreased 2. If pt requires a CVC a Trialysis is the best option as it is unlikely she will tolerate HD in current condition. This will allow for possible CVVHDF if it is an option while giving [place] central access for fluids, and vasoactive infusions.   Discussed with ED resident and PCCM NP at bedside. They are in agreement  Signed Dr Seward [facility] Critical [facility] Locums

## 2019-10-01 NOTE — ED Provider Notes (Signed)
[facility] EMERGENCY DEPARTMENT Provider Note   CSN: [id number] Arrival date & time: 10/11/2019  1705     History   Chief Complaint Chief Complaint  Patient presents with  . Cardiac Arrest    HPI Kelly [name] is a 38 y.o. female.     HPI   Patient presents for unwitnessed cardiac arrest.  Last known normal was 1430.  Patient was found by staff at her group home slumped over in her wheelchair.  Rhythm was asystole, A. fib with PEA.  EMS started CPR and gave 3 rounds of epinephrine before return of circulation.  Repeat rhythm was A. fib with RVR, rate 1 20-1 50.  Pulses were lost again, and rhythm showed torsades.  Patient was given 2 more rounds of epinephrine with magnesium.  She had return of circulation and rise after 7 minutes of circulation return to our emergency department.  She arrives GCS 3, pulses intact, King airway in place.  Past Medical History:  Diagnosis Date  . Alcohol abuse    chronic  . Anemia   . Bipolar disorder ([place])   . Chronic kidney disease    Dialysis - M/W/F  . Cognitive changes   . Cognitive impairment   . Depression   . Depression   . Diabetes mellitus without complication ([place])   . Elevated lipids   . Hyperparathyroidism ([place])   . Hypertension   . Intellectual disability   . Staph aureus infection    A/V fistula  . UTI (lower urinary tract infection)     Patient Active Problem List   Diagnosis Date Noted  . Cardiac arrest ([place]) 09/30/2019  . Peripheral vascular disease ([place]) 09/16/2019  . Critical lower limb ischemia 09/16/2019  . Peripheral arterial disease ([place]) 08/26/2019  . Pyogenic ulcer of foot ([place]) 08/04/2019  . Cellulitis and abscess of toe 08/04/2019  . Sinus tachycardia 06/22/2019  . Postoperative anemia 06/18/2019  . Closed fracture of right distal tibia 06/17/2019  . Weakness generalized 05/24/2019  . Influenza A 12/22/2018  . Hypotension 12/22/2018  . Complication from renal dialysis device  11/06/2018  . Multiple fractures of both lower extremities 06/18/2017  . 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 Hyperkalemia 06/25/2016  . Adjustment disorder with mixed disturbance of emotions and conduct   . Diabetes ([place]) 04/23/2015  . Essential hypertension 04/23/2015  . ESRD on dialysis ([place]) 04/23/2015  . Pain of left arm 04/22/2015    Past Surgical History:  Procedure Laterality Date  . A/V SHUNTOGRAM Left 10/07/2018   Procedure: A/V SHUNTOGRAM;  Surgeon: Katha Cabal, MD;  Location: [facility];  Service: Cardiovascular;  Laterality: Left;  . AV FISTULA PLACEMENT    . CHOLECYSTECTOMY    . dialysis perma cath Right    chest  . IR ANGIOGRAM EXTREMITY RIGHT  09/16/2019  . IR RADIOLOGIST EVAL & MGMT  09/03/2019  . IR TIB-PERO ART PTA MOD SED  09/16/2019  . IR TIB-PERO ART UNI PTA EA ADD VESSEL MOD SED  09/16/2019  . IR US GUIDE VASC ACCESS RIGHT  09/16/2019  . PERIPHERAL VASCULAR CATHETERIZATION N/A 05/24/2015   Procedure: Dialysis/Perma Catheter Removal;  Surgeon: Katha Cabal, MD;  Location: [facility];  Service: Cardiovascular;  Laterality: N/A;  . PERIPHERAL VASCULAR CATHETERIZATION Left 02/28/2016   Procedure: A/V Shuntogram/Fistulagram;  Surgeon: Katha Cabal, MD;  Location: [facility];  Service: Cardiovascular;  Laterality: Left;  . PERIPHERAL VASCULAR CATHETERIZATION N/A 02/28/2016  Procedure: A/V Shunt Intervention;  Surgeon: Katha Cabal, MD;  Location: [facility];  Service: Cardiovascular;  Laterality: N/A;  . PERIPHERAL VASCULAR CATHETERIZATION N/A 06/26/2016   Procedure: A/V Shuntogram/Fistulagram;  Surgeon: Katha Cabal, MD;  Location: [facility];  Service: Cardiovascular;  Laterality: N/A;  . PERIPHERAL VASCULAR CATHETERIZATION N/A 09/10/2016   Procedure: A/V Shuntogram/Fistulagram;  Surgeon: Algernon Huxley, MD;  Location: [facility];  Service: Cardiovascular;  Laterality: N/A;  .  PERIPHERAL VASCULAR CATHETERIZATION Left 09/14/2016   Procedure: Thrombectomy;  Surgeon: Katha Cabal, MD;  Location: [facility];  Service: Cardiovascular;  Laterality: Left;  . TIBIA IM NAIL INSERTION Right 06/17/2019   Procedure: INTRAMEDULLARY (IM) NAIL TIBIAL;  Surgeon: Nicholes Stairs, MD;  Location: [facility];  Service: Orthopedics;  Laterality: Right;  2.5 hrs  . VASCULAR ACCESS DEVICE INSERTION Left 04/22/2015   Procedure: INSERTION OF HERO VASCULAR ACCESS DEVICE;  Surgeon: Katha Cabal, MD;  Location: [facility];  Service: Vascular;  Laterality: Left;     OB History   No obstetric history on file.      Home Medications    Prior to Admission medications   Medication Sig Start Date End Date Taking? Authorizing Provider  acetaminophen (TYLENOL) 325 MG tablet Take 650 mg by mouth every 6 (six) hours as needed for mild pain.   Yes [provider]  albuterol (PROVENTIL HFA;VENTOLIN HFA) 108 (90 Base) MCG/ACT inhaler Inhale 2 puffs into the lungs every 4 (four) hours as needed for wheezing or shortness of breath.   Yes [provider]  aspirin 81 MG chewable tablet Chew 1 tablet (81 mg total) by mouth daily. 10/02/16  Yes Clapacs, Madie Reno, MD  B Complex-C-Folic Acid (RENA-VITE RX) 1 MG TABS Take 1 tablet by mouth daily. 10/01/16  Yes Clapacs, Madie Reno, MD  calcium acetate (PHOSLO) 667 MG capsule Take 2 capsules (1,334 mg total) by mouth 2 (two) times daily with a meal. 10/01/16  Yes Clapacs, Madie Reno, MD  cetaphil (CETAPHIL) lotion Apply 1 application topically as needed for dry skin.   Yes [provider]  cetirizine (ZYRTEC) 10 MG tablet Take 10 mg by mouth daily.   Yes [provider]  chlorhexidine (PERIDEX) 0.12 % solution Use as directed 15 mLs in the mouth or throat 2 (two) times daily. Rinse for 1 min then spit 02/01/19  Yes Wieters, Hallie C, PA-C  cinacalcet (SENSIPAR) 60 MG tablet Take 1 tablet (60 mg total) by mouth daily. Patient  taking differently: Take 120 mg by mouth daily.  10/01/16  Yes Clapacs, Madie Reno, MD  clopidogrel (PLAVIX) 75 MG tablet Take 1 tablet (75 mg total) by mouth daily. 09/17/19  Yes Docia Barrier, PA  divalproex (DEPAKOTE) 500 MG DR tablet Take 2 tablets (1,000 mg total) by mouth at bedtime. Patient taking differently: Take 500 mg by mouth daily.  10/01/16  Yes Clapacs, Madie Reno, MD  docusate sodium (COLACE) 100 MG capsule Take 1 capsule (100 mg total) by mouth at bedtime. 10/01/16  Yes Clapacs, Madie Reno, MD  escitalopram (LEXAPRO) 10 MG tablet Take 1.5 tablets (15 mg total) by mouth daily. 10/02/16  Yes Clapacs, Madie Reno, MD  fluticasone (FLOVENT HFA) 110 MCG/ACT inhaler Inhale 2 puffs into the lungs 2 (two) times daily.   Yes [provider]  folic acid (FOLVITE) 1 MG tablet Take 2 tablets (2 mg total) by mouth at bedtime. 10/01/16  Yes Clapacs, Jenny Reichmann  T, MD  gabapentin (NEURONTIN) 300 MG capsule Take 300 mg by mouth 2 (two) times daily.   Yes [provider]  glucagon (GLUCAGON EMERGENCY) 1 MG injection Inject 1 mg into the vein once as needed (low blood sugar).   Yes [provider]  insulin aspart (NOVOLOG) 100 UNIT/ML injection Inject 0-15 Units into the skin 3 (three) times daily with meals. Per sliding scale    Yes [provider]  insulin glargine (LANTUS) 100 UNIT/ML injection Inject 0.1 mLs (10 Units total) into the skin at bedtime. Patient taking differently: Inject 20 Units into the skin at bedtime.  06/01/19  Yes Georgette Shell, MD  lamoTRIgine (LAMICTAL) 150 MG tablet Take 150 mg by mouth 2 (two) times daily.    Yes [provider]  lidocaine-prilocaine (EMLA) cream Apply 1 application topically 3 (three) times a week. Apply to access site 30 minutes before dialysis. Patient taking differently: Apply 1 application topically every Monday, Wednesday, and Friday. Apply to access site 30 minutes before dialysis.  10/01/16  Yes Clapacs, Madie Reno, MD   medroxyPROGESTERone (DEPO-PROVERA) 150 MG/ML injection Inject 150 mg into the muscle every 3 (three) months.   Yes [provider]  midodrine (PROAMATINE) 10 MG tablet Take 10 mg by mouth See admin instructions. Can be given up to 2 times daily during dialysis treatment   Yes [provider]  multivitamin-iron-minerals-folic acid (THERAPEUTIC-M) TABS tablet Take 1 tablet by mouth daily.   Yes [provider]  paliperidone (INVEGA SUSTENNA) 234 MG/1.5ML SUSY injection Inject 234 mg into the muscle See admin instructions. EVERY 28 DAYS    Yes [provider]  pantoprazole (PROTONIX) 40 MG tablet Take 40 mg by mouth daily.   Yes [provider]  patiromer (VELTASSA) 8.4 g packet Take 1 packet (8.4 g total) by mouth daily. Patient taking differently: Take 8.4 g by mouth daily. Mixed into 1/3 cup of drink 10/01/16  Yes Clapacs, Madie Reno, MD  QUEtiapine (SEROQUEL) 100 MG tablet Take 100 mg by mouth at bedtime.   Yes [provider]  Tiotropium Bromide-Olodaterol (STIOLTO RESPIMAT) 2.5-2.5 MCG/ACT AERS Inhale 2 puffs into the lungs daily.   Yes [provider]  traZODone (DESYREL) 100 MG tablet Take 100 mg by mouth at bedtime.    Yes [provider]    Family History Family History  Family history unknown: Yes    Social History Social History   Tobacco Use  . Smoking status: Current Every Day Smoker    Packs/day: 0.25    Years: 0.00    Pack years: 0.00    Types: Cigarettes  . Smokeless tobacco: Current User  Substance Use Topics  . Alcohol use: No  . Drug use: No     Allergies   Patient has no known allergies.   Review of Systems Review of Systems  Unable to perform ROS: Acuity of condition     Physical Exam Updated Vital Signs BP (!) 106/53   Pulse 85   Temp (!) 95.2 F (35.1 C) (Temporal)   Resp 20   Ht _0  (1.575 m)   Wt 101 kg   SpO2 100%   BMI 40.73 kg/m   Physical Exam Vitals signs and  nursing note reviewed.  Constitutional:      Appearance: She is well-developed. She is obese.  HENT:     Head: Normocephalic and atraumatic.  Eyes:     Conjunctiva/sclera: Conjunctivae normal.     Pupils: Pupils are  equal, round, and reactive to light.  Neck:     Musculoskeletal: Neck supple.  Cardiovascular:     Rate and Rhythm: Normal rate and regular rhythm.     Heart sounds: No murmur.  Pulmonary:     Comments: King airway in place, satting 100%.  Port palpable on left chest wall Abdominal:     General: There is no distension.     Palpations: Abdomen is soft.  Skin:    General: Skin is warm and dry.     Comments: No sign of trauma.  Multiple keloids present on skin  Neurological:     Comments: GCS 3, no reflexes present      ED Treatments / Results  Labs (all labs ordered are listed, but only abnormal results are displayed) Labs Reviewed  CBC - Abnormal; Notable for the following components:      Result Value   WBC 22.4 (*)    RBC 3.48 (*)    Hemoglobin 10.0 (*)    MCV 107.2 (*)    MCHC 26.8 (*)    RDW 15.8 (*)    Platelets 569 (*)    nRBC 0.5 (*)    All other components within normal limits  COMPREHENSIVE METABOLIC PANEL - Abnormal; Notable for the following components:   Potassium 3.1 (*)    Chloride 95 (*)    CO2 17 (*)    Glucose, Bld 219 (*)    Creatinine, Ser 5.39 (*)    Calcium 11.0 (*)    Total Protein 6.1 (*)    Albumin 1.7 (*)    AST 356 (*)    GFR calc non Af Amer 9 (*)    GFR calc Af Amer 11 (*)    Anion gap 26 (*)    All other components within normal limits  LACTIC ACID, PLASMA - Abnormal; Notable for the following components:   Lactic Acid, Venous 10.6 (*)    All other components within normal limits  VALPROIC ACID LEVEL - Abnormal; Notable for the following components:   Valproic Acid Lvl 17 (*)    All other components within normal limits  I-STAT CHEM 8, ED - Abnormal; Notable for the following components:   Potassium 3.0 (*)     Creatinine, Ser 4.80 (*)    Glucose, Bld 210 (*)    Calcium, Ion 1.45 (*)    Hemoglobin 11.9 (*)    HCT 35.0 (*)    All other components within normal limits  I-STAT BETA HCG BLOOD, ED ([facility], [facility], [facility] ONLY) - Abnormal; Notable for the following components:   I-stat hCG, quantitative 16.5 (*)    All other components within normal limits  I-STAT CREATININE, ED - Abnormal; Notable for the following components:   Creatinine, Ser 4.50 (*)    All other components within normal limits  CBG MONITORING, ED - Abnormal; Notable for the following components:   Glucose-Capillary 197 (*)    All other components within normal limits  POCT I-STAT 7, (LYTES, BLD GAS, ICA,H+H) - Abnormal; Notable for the following components:   pH, Arterial 7.237 (*)    pCO2 arterial 58.0 (*)    pO2, Arterial 196.0 (*)    Acid-base deficit 3.0 (*)    Potassium 3.2 (*)    Calcium, Ion 1.48 (*)    HCT 32.0 (*)    Hemoglobin 10.9 (*)    All other components within normal limits  TROPONIN I (HIGH SENSITIVITY) - Abnormal; Notable for the following components:   Troponin  I (High Sensitivity) 20 (*)    All other components within normal limits  SARS CORONAVIRUS 2 (TAT 6-24 HRS)  CULTURE, BLOOD (ROUTINE X 2)  CULTURE, BLOOD (ROUTINE X 2)  URINE CULTURE  CULTURE, RESPIRATORY  ETHANOL  RAPID URINE DRUG SCREEN, HOSP PERFORMED  BLOOD GAS, ARTERIAL  URINALYSIS, ROUTINE W REFLEX MICROSCOPIC  MAGNESIUM  PHOSPHORUS  CBC  BASIC METABOLIC PANEL  BLOOD GAS, ARTERIAL  MAGNESIUM  PHOSPHORUS  CORTISOL  LACTIC ACID, PLASMA  LACTIC ACID, PLASMA  I-STAT CHEM 8, ED  TROPONIN I (HIGH SENSITIVITY)    EKG EKG Interpretation  Date/Time:  Thursday October 01 2019 17:15:47 EST Ventricular Rate:  94 PR Interval:    QRS Duration: 109 QT Interval:  434 QTC Calculation: 543 R Axis:   49 Text Interpretation: Sinus rhythm Borderline ST depression, lateral leads Prolonged QT interval Confirmed by Lajean Saver [id number]) on 09/30/2019  5:23:01 PM   Radiology Dg Chest Portable 1 View  Result Date: 10/06/2019 CLINICAL DATA:  ETT EXAM: PORTABLE CHEST 1 VIEW COMPARISON:  05/24/2019 FINDINGS: Endotracheal tube tip is about 17 mm superior to the carina. Left-sided central venous catheter tip over the proximal right atrium. Low lung volumes. No focal opacity, or pleural effusion. Left axillary vascular stent. Normal heart size. No pneumothorax. Esophageal tube tip in the left upper quadrant of the abdomen IMPRESSION: 1. Endotracheal tube tip about 17 mm superior to the carina 2. Low lung volumes Electronically Signed   By: Donavan Foil M.D.   On: 10/16/2019 17:59    Procedures Procedure Name: Intubation Date/Time: 10/20/2019 5:19 PM Performed by: Julianne Rice, MD Pre-anesthesia Checklist: Suction available, Patient being monitored and Timeout performed Oxygen Delivery Method: Ambu bag Preoxygenation: Pre-oxygenation with 100% oxygen Ventilation: Mask ventilation without difficulty Laryngoscope Size: Mac, 3 and Glidescope Grade View: Grade I Tube size: 7.5 mm Number of attempts: 1 Airway Equipment and Method: Rigid stylet and Video-laryngoscopy Placement Confirmation: ETT inserted through vocal cords under direct vision,  CO2 detector and Breath sounds checked- equal and bilateral Secured at: 23 cm Tube secured with: ETT holder Dental Injury: Teeth and Oropharynx as per pre-operative assessment  Difficulty Due To: Difficulty was unanticipated      (including critical care time)  Medications Ordered in ED Medications  heparin injection 5,000 Units (has no administration in time range)  pantoprazole (PROTONIX) injection 40 mg (has no administration in time range)  0.9 %  sodium chloride infusion (has no administration in time range)  norepinephrine (LEVOPHED) 41m in 2550mpremix infusion (30 mcg/min Intravenous New Bag/Given 09/28/2019 1843)  hydrocortisone sodium succinate (SOLU-CORTEF) 100 MG injection 100 mg (has  no administration in time range)  hydrocortisone sodium succinate (SOLU-CORTEF) 100 MG injection 50 mg (has no administration in time range)  potassium chloride 20 MEQ/15ML (10%) solution 40 mEq (has no administration in time range)  calcium gluconate inj 10% (1 g) URGENT USE ONLY! (has no administration in time range)  magnesium sulfate IVPB 2 g 50 mL (has no administration in time range)  clopidogrel (PLAVIX) tablet 75 mg (has no administration in time range)  valproate (DEPACON) 500 mg in dextrose 5 % 50 mL IVPB (has no administration in time range)  valproate (DEPACON) 1,000 mg in dextrose 5 % 50 mL IVPB (has no administration in time range)  midodrine (PROAMATINE) tablet 10 mg (has no administration in time range)  lamoTRIgine (LAMICTAL) tablet 150 mg (has no administration in time range)  aspirin chewable tablet 81 mg (has no administration in  time range)  insulin aspart (novoLOG) injection 0-9 Units (has no administration in time range)  piperacillin-tazobactam (ZOSYN) IVPB 3.375 g (has no administration in time range)  vancomycin (VANCOCIN) 2,000 mg in sodium chloride 0.9 % 500 mL IVPB (has no administration in time range)  vancomycin (VANCOCIN) IVPB 1000 mg/200 mL premix (has no administration in time range)  fentaNYL (SUBLIMAZE) injection 50 mcg (has no administration in time range)  fentaNYL (SUBLIMAZE) injection 50-200 mcg (has no administration in time range)  midazolam (VERSED) injection 2 mg (has no administration in time range)  midazolam (VERSED) injection 2 mg (has no administration in time range)  docusate (COLACE) 50 MG/5ML liquid 100 mg (has no administration in time range)  sodium chloride 0.9 % bolus 1,000 mL (1,000 mLs Intravenous New Bag/Given 10/11/2019 1712)  norepinephrine (LEVOPHED) injection ( Intravenous Stopped 10/18/2019 1840)     Initial Impression / Assessment and Plan / ED Course  I have reviewed the triage vital signs and the nursing notes.  Pertinent labs &  imaging results that were available during my care of the patient were reviewed by me and considered in my medical decision making (see chart for details).        Kelly [name] is a 38 y.o. female with a complex medical history including dialysis for end-stage renal disease, cognitive delay, known alcohol abuse, currently residing at a facility presenting today after being found unresponsive with last known normal at 1430.  She had a total of 10 to 15 minutes of CPR, 5 rounds of epinephrine given.  No shocks were given prior to arrival.  She arrives pulses intact, King airway in place that was replaced per the above procedure note with a 7.5 cuffed tube.  Labs and imaging ordered to evaluate for intracranial hemorrhage, sepsis, electrolyte abnormality.  Levophed initiated soon after intubation due to maps of 50-60.  Patient remained GCS 3, RSI was not given.  No sedation has been used at this point.  Intensivist consulted.  Labs do not show any acute abnormality at this time, the EKG showed prolonged QT.  Care of patient discussed with the supervising attending.  Final Clinical Impressions(s) / ED Diagnoses   Final diagnoses:  Cardiac arrest ([place])  Acute respiratory failure, unspecified whether with hypoxia or hypercapnia [facility])    ED Discharge Orders    None       Julianne Rice, MD 10/13/2019 2148    Lajean Saver, MD 10/07/2019 2302

## 2019-10-01 NOTE — Progress Notes (Signed)
Pharmacy Antibiotic Note  Kelly [name] is a 38 y.o. female admitted on 10/09/2019 with unwitnessed cardiac arrest.  Pharmacy has been consulted for vancomycin and zosyn dosing for unknown source. Patient is ESRD on HD MWF. WBC 22.4. Lactic acid 10.6.  Plan: Zosyn 2.25g IV q8h  Vancomycin 2000mg  IV x1 loading dose Vancomycin 1000mg  IV after HD on MWF  Follow up nephrology plans in the AM and adjust vancomycin regimen as indicated Monitor HD schedule, cultures/sensitivites and clinical progression  Height: 5\' 2"  (157.5 cm) Weight: 222 lb 10.6 oz (101 kg) IBW/kg (Calculated) : 50.1  Temp (24hrs), Avg:95.2 F (35.1 C), Min:95.2 F (35.1 C), Max:95.2 F (35.1 C)  Recent Labs  Lab 10/12/2019 1710 10/25/2019 1711 10/09/2019 1715 10/23/2019 1740  WBC  --   --   --  22.4*  CREATININE 4.80*  --  4.50*  --   LATICACIDVEN  --  10.6*  --   --     Estimated Creatinine Clearance: 19.1 mL/min (A) (by C-G formula based on SCr of 4.5 mg/dL (H)).    No Known Allergies  Antimicrobials this admission: Zosyn 11/5 >> Vancomycin 11/5 >>  Dose adjustments this admission: N/A  Microbiology results: 11/5 BCx: orered 11./5 UCx: ordered 11/5 Sputum: ordered  Thank you for allowing pharmacy to be a part of this patient's care.  Cristela Felt, PharmD PGY1 Pharmacy Resident [employer]: [phone]  10/02/2019 6:30 PM

## 2019-10-02 ENCOUNTER — Inpatient Hospital Stay (HOSPITAL_COMMUNITY): Payer: Medicaid Other

## 2019-10-02 ENCOUNTER — Other Ambulatory Visit: Payer: Self-pay

## 2019-10-02 ENCOUNTER — Encounter (HOSPITAL_COMMUNITY): Payer: Self-pay

## 2019-10-02 DIAGNOSIS — I469 Cardiac arrest, cause unspecified: Secondary | ICD-10-CM

## 2019-10-02 DIAGNOSIS — L8990 Pressure ulcer of unspecified site, unspecified stage: Secondary | ICD-10-CM | POA: Diagnosis not present

## 2019-10-02 LAB — POCT I-STAT 7, (LYTES, BLD GAS, ICA,H+H)
Acid-Base Excess: 3 mmol/L — ABNORMAL HIGH (ref 0.0–2.0)
Bicarbonate: 24.6 mmol/L (ref 20.0–28.0)
Calcium, Ion: 1.38 mmol/L (ref 1.15–1.40)
HCT: 31 % — ABNORMAL LOW (ref 36.0–46.0)
Hemoglobin: 10.5 g/dL — ABNORMAL LOW (ref 12.0–15.0)
O2 Saturation: 95 %
Patient temperature: 97.3
Potassium: 2.5 mmol/L — CL (ref 3.5–5.1)
Sodium: 136 mmol/L (ref 135–145)
TCO2: 25 mmol/L (ref 22–32)
pCO2 arterial: 25.7 mmHg — ABNORMAL LOW (ref 32.0–48.0)
pH, Arterial: 7.587 — ABNORMAL HIGH (ref 7.350–7.450)
pO2, Arterial: 59 mmHg — ABNORMAL LOW (ref 83.0–108.0)

## 2019-10-02 LAB — GLUCOSE, CAPILLARY
Glucose-Capillary: 178 mg/dL — ABNORMAL HIGH (ref 70–99)
Glucose-Capillary: 136 mg/dL — ABNORMAL HIGH (ref 70–99)
Glucose-Capillary: 132 mg/dL — ABNORMAL HIGH (ref 70–99)
Glucose-Capillary: 105 mg/dL — ABNORMAL HIGH (ref 70–99)
Glucose-Capillary: 116 mg/dL — ABNORMAL HIGH (ref 70–99)
Glucose-Capillary: 124 mg/dL — ABNORMAL HIGH (ref 70–99)

## 2019-10-02 LAB — BASIC METABOLIC PANEL
Anion gap: 16 — ABNORMAL HIGH (ref 5–15)
Anion gap: 16 — ABNORMAL HIGH (ref 5–15)
BUN: 23 mg/dL — ABNORMAL HIGH (ref 6–20)
BUN: 24 mg/dL — ABNORMAL HIGH (ref 6–20)
CO2: 21 mmol/L — ABNORMAL LOW (ref 22–32)
CO2: 21 mmol/L — ABNORMAL LOW (ref 22–32)
Calcium: 10.3 mg/dL (ref 8.9–10.3)
Calcium: 10.9 mg/dL — ABNORMAL HIGH (ref 8.9–10.3)
Chloride: 100 mmol/L (ref 98–111)
Chloride: 98 mmol/L (ref 98–111)
Creatinine, Ser: 5.24 mg/dL — ABNORMAL HIGH (ref 0.44–1.00)
Creatinine, Ser: 5.24 mg/dL — ABNORMAL HIGH (ref 0.44–1.00)
GFR calc Af Amer: 11 mL/min — ABNORMAL LOW (ref >60)
GFR calc Af Amer: 11 mL/min — ABNORMAL LOW (ref >60)
GFR calc non Af Amer: 10 mL/min — ABNORMAL LOW (ref >60)
GFR calc non Af Amer: 10 mL/min — ABNORMAL LOW (ref >60)
Glucose, Bld: 197 mg/dL — ABNORMAL HIGH (ref 70–99)
Glucose, Bld: 215 mg/dL — ABNORMAL HIGH (ref 70–99)
Potassium: 2.5 mmol/L — CL (ref 3.5–5.1)
Potassium: 2.9 mmol/L — ABNORMAL LOW (ref 3.5–5.1)
Sodium: 135 mmol/L (ref 135–145)
Sodium: 137 mmol/L (ref 135–145)

## 2019-10-02 LAB — CBC
HCT: 34.6 % — ABNORMAL LOW (ref 36.0–46.0)
Hemoglobin: 10.8 g/dL — ABNORMAL LOW (ref 12.0–15.0)
MCH: 28.9 pg (ref 26.0–34.0)
MCHC: 31.2 g/dL (ref 30.0–36.0)
MCV: 92.5 fL (ref 80.0–100.0)
Platelets: 443 10*3/uL — ABNORMAL HIGH (ref 150–400)
RBC: 3.74 MIL/uL — ABNORMAL LOW (ref 3.87–5.11)
RDW: 15.4 % (ref 11.5–15.5)
WBC: 13.1 10*3/uL — ABNORMAL HIGH (ref 4.0–10.5)
nRBC: 0 % (ref 0.0–0.2)

## 2019-10-02 LAB — ECHOCARDIOGRAM COMPLETE
Height: 62 in
Weight: 2751.34 oz

## 2019-10-02 LAB — MRSA PCR SCREENING: MRSA by PCR: NEGATIVE

## 2019-10-02 LAB — PHOSPHORUS: Phosphorus: 2 mg/dL — ABNORMAL LOW (ref 2.5–4.6)

## 2019-10-02 LAB — MAGNESIUM: Magnesium: 2.7 mg/dL — ABNORMAL HIGH (ref 1.7–2.4)

## 2019-10-02 LAB — LACTIC ACID, PLASMA: Lactic Acid, Venous: 2.8 mmol/L (ref 0.5–1.9)

## 2019-10-02 MED ORDER — MUPIROCIN CALCIUM 2 % EX CREA
TOPICAL_CREAM | Freq: Every day | CUTANEOUS | Status: DC
  Administered 2019-10-02 – 2019-10-03 (×2): 1 via TOPICAL
  Filled 2019-10-02: qty 15

## 2019-10-02 MED ORDER — POTASSIUM CHLORIDE 20 MEQ/15ML (10%) PO SOLN
40.0000 meq | Freq: Once | ORAL | Status: AC
  Administered 2019-10-02: 06:00:00 40 meq
  Filled 2019-10-02: qty 30

## 2019-10-02 MED ORDER — POLYVINYL ALCOHOL 1.4 % OP SOLN
1.0000 [drp] | Freq: Four times a day (QID) | OPHTHALMIC | Status: DC
  Administered 2019-10-02 – 2019-10-03 (×6): 1 [drp] via OPHTHALMIC
  Filled 2019-10-02: qty 15

## 2019-10-02 MED ORDER — VANCOMYCIN VARIABLE DOSE PER UNSTABLE RENAL FUNCTION (PHARMACIST DOSING): Status: DC

## 2019-10-02 MED ORDER — CHLORHEXIDINE GLUCONATE 0.12% ORAL RINSE (MEDLINE KIT)
15.0000 mL | Freq: Two times a day (BID) | OROMUCOSAL | Status: DC
  Administered 2019-10-02 – 2019-10-03 (×4): 15 mL via OROMUCOSAL

## 2019-10-02 MED ORDER — CHLORHEXIDINE GLUCONATE CLOTH 2 % EX PADS
6.0000 | MEDICATED_PAD | Freq: Every day | CUTANEOUS | Status: DC
  Administered 2019-10-02: 10:00:00 6 via TOPICAL

## 2019-10-02 MED ORDER — POTASSIUM CHLORIDE 20 MEQ/15ML (10%) PO SOLN
40.0000 meq | ORAL | Status: AC
  Administered 2019-10-02 (×2): 40 meq
  Filled 2019-10-02 (×2): qty 30

## 2019-10-02 MED ORDER — NOREPINEPHRINE 16 MG/250ML-% IV SOLN
0.0000 ug/min | INTRAVENOUS | Status: DC
  Administered 2019-10-02: 5 ug/min via INTRAVENOUS
  Administered 2019-10-03: 10 ug/min via INTRAVENOUS
  Filled 2019-10-02 (×2): qty 250

## 2019-10-02 MED ORDER — MORPHINE 100MG IN NS 100ML (1MG/ML) PREMIX INFUSION
1.0000 mg/h | INTRAVENOUS | Status: DC
  Filled 2019-10-02: qty 100

## 2019-10-02 MED ORDER — IPRATROPIUM BROMIDE 0.02 % IN SOLN: 0.5000 mg | Freq: Four times a day (QID) | RESPIRATORY_TRACT | Status: DC | PRN

## 2019-10-02 MED ORDER — ORAL CARE MOUTH RINSE
15.0000 mL | OROMUCOSAL | Status: DC
  Administered 2019-10-02 – 2019-10-03 (×16): 15 mL via OROMUCOSAL

## 2019-10-02 NOTE — Progress Notes (Signed)
Patient transported from [facility] to 37M without complications.

## 2019-10-02 NOTE — Progress Notes (Signed)
eLink Physician-Brief Progress Note Patient Name: Kelly [name] DOB: Jul 04, 1981 MRN: [id number]   Date of Service  10/02/2019  HPI/Events of Note  71 F ESRD on HD found unresponsive in her group home was in cardiac arrest when EMS arrived with ROSC after about 17 min. Initially asystole then torsades.  eICU Interventions   K 2.5, ordered additional correction  Vent rate decreased        Raquel Sarna T Dolores Ewing 10/02/2019, 6:25 AM

## 2019-10-02 NOTE — Progress Notes (Signed)
NAME:  Kelly [name], MRN:  [id number], DOB:  Mar 18, 1981, LOS: 1 ADMISSION DATE:  10/11/2019, CONSULTATION DATE:  10/08/2019 REFERRING MD:  Dr. Sherril Croon, CHIEF COMPLAINT:  Cardiac arrest    Brief History   38 year old female found down pulseless after cardiac arrest by at local group staff, unknown down time. CPR initiated by EMS with eventual ROSC.   History of present illness   38 yo F with history extensive medical history including cognitive impairment and ESRD presented to the emergency department via EMS after unwitnessed cardiac arrest, patient lives at group home was found by staff slumped over in chair. CPR initiated by EMS with 3 rounds of epi given with ROSC achieved, cardiac rhythm at that time was A-fib RVR rate of 120s to 150s. Shortly after ROSC patient lost pulses again with rhythm showing torsades, she received magnesium and 2 more rounds of epinephrine with again ROSC achieved.  Lab work on admission with mild hypokalemia hyperglycemia, elevated BUN and creatinine consistent with end-stage renal disease, leukocytosis and thrombocytosis.  Chest x-ray with good position of ET tube and volumes.  CT scan head pending.  PCCM consulted for admission.   Past Medical History  ESRD on iHD MWF Cognitively impaired  Hyperparathyroidism Hyperlipidemia Type 2 diabetes Depression Anemia Bipolar disease Alcohol abuse  Significant Hospital Events   11/5 >> admitted after cardiac arrest, intubated  Consults:  PCCM  Procedures:  ETT 11/5 >>  Significant Diagnostic Tests:  CXR 11/5 >> Chest x-ray with good position of ET tube and volumes  Micro Data:  COVID 11/5 >>  Urine culture 11/5 >> Blood cultures 11/5 >> Respiratory culture 11/5 >>  Antimicrobials:  Vanco 11/5 >> Zosyn 11/5 >>   Interim history/subjective:    Objective   Blood pressure (!) 99/56, pulse 78, temperature 98.2 F (36.8 C), temperature source Oral, resp. rate 20, height 5\' 2"  (1.575 m), weight 78  kg, SpO2 97 %.    Vent Mode: PCV FiO2 (%):  [40 %-100 %] 40 % Set Rate:  [16 bmp-20 bmp] 16 bmp Vt Set:  [400 mL] 400 mL PEEP:  [5 cmH20] 5 cmH20 Plateau Pressure:  [18 cmH20-21 cmH20] 18 cmH20   Intake/Output Summary (Last 24 hours) at 10/02/2019 [id number] Last data filed at 10/02/2019 0800 Gross per 24 hour  Intake 593.91 ml  Output 0 ml  Net 593.91 ml   Filed Weights   10/20/2019 1712 10/02/19 0437  Weight: 101 kg 78 kg    Examination: General: Adult female on mechanical ventilation, in NAD HEENT: ETT, MM pink/moist, pupils 39mm with sluggish response  Neuro: Unresponsive CV: s1s2 regular rate and rhythm, no murmur, rubs, or gallops,  PULM:  Diminished breath sounds with faint rhonchi in upper lobes  GI: soft, bowel sounds active in all 4 quadrants, non-tender, non-distended, OG to LIWS Extremities: warm/dry, no edema  Skin: no rashes or lesions   Resolved Hospital Problem list     Assessment & Plan:  Asystolic Cardiac Arrest - unknown downtime.  Unclear etiology at this point.  Possible 2/2 electrolyte derangements given initial K of 3 (3.2 on repeat) and reported torsades after 2nd arrest. P: - Defer TTM given unknown downtime.  Avoid fevers. - Assess electrolytes. - 2g Mag empirically. - Assess echo, troponins. - Trend lactate. - If no improvement within next 24-48 consider comfort measures   - Follow up with CT head  Shock - presumed 2/2 above +/- sepsis.  History staph infection AV fistula -Empiric antibiotics initiated 11/5. -  Blood and urine cultures ordered, pending -Stress dose steroids as ordered -Norepinephrine, MAP goal 65-wean as able -On outpatient midodrine  Acute encephalopathy, concerning for hypoxic injury.   Currently on no sedation -Suspect that there is been a profound brain injury.  Will plan to discuss with the patient's legal guardian  ESRD on HD MWF. -Will notify nephrology of admission if the direction of care is to continue aggressive  support. -HD catheter placed in the event CVVH indicated  Hypocalcemia/hypokalemia, persists on 11/6 -Continue aggressive potassium replacement, remains low 11/6 -Follow BMP  Hx HTN, HLD, PVD. -ASA, clopidogrel  Hx DM. -Sliding-scale insulin per protocol -Preadmission Lantus on hold  Hx depression, cognitive impairment, bipolar disorder, EtOH abuse. -Prevention seizure regimen ordered -Escitalopram, gabapentin, quetiapine, trazodone all on hold   Best practice:  Diet: NPO, consider initiation of TF within next 24 hrs  Pain/Anxiety/Delirium protocol (if indicated): PRN fentanyl  VAP protocol (if indicated): In place  DVT prophylaxis: SCD GI prophylaxis: PPI Glucose control: SSI Mobility: Bedrest Code Status: Full Family Communication: I spoke with [name] the patient's legal guardian this morning 11/6.  I explained that the patient likely has a severe brain injury, extremely poor neurological and overall prognosis.  She understands.  We agreed that she should be DNR.  We also agreed to defer hemodialysis with the expectation that we will likely be transitioning over to a comfort care approach.  Tedra Coupe needs to speak with her group home care team, will get back to [place] in the ICU regarding these plans. Disposition: ICU  Labs   CBC: Recent Labs  Lab 09/29/2019 1710 10/18/2019 1740 10/20/2019 1806 10/26/2019 2208 10/02/19 0402  WBC  --  22.4*  --   --   --   HGB 11.9* 10.0* 10.9* 11.2* 10.5*  HCT 35.0* 37.3 32.0* 33.0* 31.0*  MCV  --  107.2*  --   --   --   PLT  --  569*  --   --   --     Basic Metabolic Panel: Recent Labs  Lab 10/25/2019 1710 10/26/2019 1715 10/08/2019 1740 10/15/2019 1806 09/28/2019 2049 10/19/2019 2208 10/02/19 0017 10/02/19 0355 10/02/19 0402  NA 137  --  138 136  --  136 135 137 136  K 3.0*  --  3.1* 3.2*  --  3.1* 2.9* 2.5* 2.5*  CL 98  --  95*  --   --   --  98 100  --   CO2  --   --  17*  --   --   --  21* 21*  --   GLUCOSE 210*  --  219*  --   --    --  215* 197*  --   BUN 19  --  18  --   --   --  23* 24*  --   CREATININE 4.80* 4.50* 5.39*  --   --   --  5.24* 5.24*  --   CALCIUM  --   --  11.0*  --   --   --  10.3 10.9*  --   MG  --   --   --   --  3.3*  --   --  2.7*  --   PHOS  --   --   --   --  5.2*  --   --  2.0*  --    GFR: Estimated Creatinine Clearance: 14.2 mL/min (A) (by C-G formula based on SCr of 5.24 mg/dL (H)).  Recent Labs  Lab 10/16/2019 1711 10/11/2019 1740 10/16/2019 2050 10/02/19 0017  WBC  --  22.4*  --   --   LATICACIDVEN 10.6*  --  4.3* 2.8*    Liver Function Tests: Recent Labs  Lab 10/12/2019 1740  AST 356*  ALT 106*  ALKPHOS 114  BILITOT 0.5  PROT 6.1*  ALBUMIN 1.7*   No results for input(s): LIPASE, AMYLASE in the last 168 hours. No results for input(s): AMMONIA in the last 168 hours.  ABG    Component Value Date/Time   PHART 7.587 (H) 10/02/2019 0402   PCO2ART 25.7 (L) 10/02/2019 0402   PO2ART 59.0 (L) 10/02/2019 0402   HCO3 24.6 10/02/2019 0402   TCO2 25 10/02/2019 0402   ACIDBASEDEF 3.0 (H) 10/20/2019 1806   O2SAT 95.0 10/02/2019 0402     Coagulation Profile: Recent Labs  Lab 10/13/2019 2052  INR 1.3*    Cardiac Enzymes: No results for input(s): CKTOTAL, CKMB, CKMBINDEX, TROPONINI in the last 168 hours.  HbA1C: Hemoglobin A1C  Date/Time Value Ref Range Status  10/07/2012 04:08 AM 5.7 4.2 - 6.3 % Final    Comment:    The American Diabetes Association recommends that a primary goal of therapy should be <7% and that physicians should reevaluate the treatment regimen in patients with HbA1c values consistently >8%.    Hgb A1c MFr Bld  Date/Time Value Ref Range Status  05/25/2019 12:26 AM 6.3 (H) 4.8 - 5.6 % Final    Comment:    (NOTE) Pre diabetes:          5.7%-6.4% Diabetes:              >6.4% Glycemic control for   <7.0% adults with diabetes     CBG: Recent Labs  Lab 10/23/2019 1714 09/30/2019 2021 10/05/2019 2354 10/02/19 0345 10/02/19 0749  GLUCAP 197* 190* 186*  178* 136*     Critical care time:   Independent critical care time 35 minutes  Signature:   Baltazar Apo, MD, PhD 10/02/2019, 8:57 AM [facility] [phone] or if no answer [phone]

## 2019-10-02 NOTE — Progress Notes (Signed)
PCCM  Per 2H Charge Bed need for post surgical cases Lateral transfer to 74M ordered. Will notify [facility] to [facility] charge ok with accepting once bed is available   Signed Dr Seward [facility] Critical Care Locums

## 2019-10-02 NOTE — Progress Notes (Signed)
RN transported patient to 47M with Respiratory Therapist. Vital signs remained stable during transport. Belongings at the bedside.

## 2019-10-02 NOTE — Progress Notes (Signed)
Patient has copious secretions from the mouth. RN spoke with [facility] RN regarding copious secretions. Elink RN stated she would relay the message to the physician. Waiting on orders.

## 2019-10-02 NOTE — Progress Notes (Signed)
Patient transported to MRI and back without complications. RN at bedside. 

## 2019-10-02 NOTE — Consult Note (Signed)
[facility] Nurse wound consult note  Reason for Consult: Consult requested for several pressure injuries.  Pt is critically ill with multiple systemic factors which can impair healing.  Wound type: Sacrum with stage 2 pink and moist; affected area approx 5X4X.1cm Right ischium with patchy areas of partial thickness linear abrasions; red and moist; affected area approx 5X5cm and scattered Left heel with darker colored skin ; deep tissue pressure injury (DTPI) 2X2cm Left outer ankle with DTPI 1.5X1.5cm Left anterior leg with full thickness abrasion; .3X1X.3cm, red and moist Left ankle with DTPI, linear marks Left toes darker-colored intact skin Right heel with DTPI 1X1cm Right great toe full thickness wound red and dry; 4X4X.2cm Right toes and foot darker colored mottled skin Pressure Injury POA: Yes Dressing procedure/placement/frequency: Pt is on a low airloss mattress to reduce pressure.  Prevalon boots to reduce pressure to heels. Foam dressings to protect from further injury.  Bactroban to promote moist healing to right great toe. Please re-consult if further assistance is needed.  Thank-you,  Julien Girt MSN, [place], [place], [place], [place]

## 2019-10-02 NOTE — Progress Notes (Signed)
EEG complete - results pending 

## 2019-10-02 NOTE — Progress Notes (Signed)
ABG done, results downloaded and given to RN. RN states he will notify MD.

## 2019-10-02 NOTE — Progress Notes (Signed)
This RN spoke with [name] pts legal guardian. Updated by RN on pt current status. Guardian would like to speak with MD about code status and plan of care.   If unable to reach her she states it is okay to call the crisis line and leave a voicemail at [phone], select option nine. This is a 24/7 line. Her extension is [phone].  RN will relay this information to rounding physician

## 2019-10-02 NOTE — Progress Notes (Signed)
  Echocardiogram  2D echo has been performed.  Kelly [name] 10/02/2019, 8:32 AM

## 2019-10-03 DIAGNOSIS — G931 Anoxic brain damage, not elsewhere classified: Secondary | ICD-10-CM

## 2019-10-03 DIAGNOSIS — Z9911 Dependence on respirator [ventilator] status: Secondary | ICD-10-CM

## 2019-10-03 LAB — GLUCOSE, CAPILLARY
Glucose-Capillary: 115 mg/dL — ABNORMAL HIGH (ref 70–99)
Glucose-Capillary: 152 mg/dL — ABNORMAL HIGH (ref 70–99)
Glucose-Capillary: 177 mg/dL — ABNORMAL HIGH (ref 70–99)
Glucose-Capillary: 187 mg/dL — ABNORMAL HIGH (ref 70–99)
Glucose-Capillary: 151 mg/dL — ABNORMAL HIGH (ref 70–99)

## 2019-10-03 MED ORDER — GLYCOPYRROLATE 0.2 MG/ML IJ SOLN: 0.2000 mg | INTRAMUSCULAR | Status: DC | PRN

## 2019-10-03 MED ORDER — MORPHINE BOLUS VIA INFUSION
5.0000 mg | INTRAVENOUS | Status: DC | PRN
  Administered 2019-10-03: 5 mg via INTRAVENOUS
  Filled 2019-10-03: qty 5

## 2019-10-03 MED ORDER — ACETAMINOPHEN 650 MG RE SUPP: 650.0000 mg | Freq: Four times a day (QID) | RECTAL | Status: DC | PRN

## 2019-10-03 MED ORDER — DIPHENHYDRAMINE HCL 50 MG/ML IJ SOLN: 25.0000 mg | INTRAMUSCULAR | Status: DC | PRN

## 2019-10-03 MED ORDER — GLYCOPYRROLATE 1 MG PO TABS: 1.0000 mg | ORAL_TABLET | ORAL | Status: DC | PRN

## 2019-10-03 MED ORDER — GLYCOPYRROLATE 0.2 MG/ML IJ SOLN
0.2000 mg | INTRAMUSCULAR | Status: DC | PRN
  Administered 2019-10-03: 0.2 mg via INTRAVENOUS

## 2019-10-03 MED ORDER — POLYVINYL ALCOHOL 1.4 % OP SOLN
1.0000 [drp] | Freq: Four times a day (QID) | OPHTHALMIC | Status: DC | PRN
  Filled 2019-10-03: qty 15

## 2019-10-03 MED ORDER — DEXTROSE 5 % IV SOLN: INTRAVENOUS | Status: DC

## 2019-10-03 MED ORDER — MORPHINE SULFATE (PF) 2 MG/ML IV SOLN
2.0000 mg | INTRAVENOUS | Status: DC | PRN
  Administered 2019-10-03 (×2): 2 mg via INTRAVENOUS
  Filled 2019-10-03 (×2): qty 1

## 2019-10-03 MED ORDER — ACETAMINOPHEN 325 MG PO TABS: 650.0000 mg | ORAL_TABLET | Freq: Four times a day (QID) | ORAL | Status: DC | PRN

## 2019-10-03 MED ORDER — MORPHINE 100MG IN NS 100ML (1MG/ML) PREMIX INFUSION
0.0000 mg/h | INTRAVENOUS | Status: DC
  Administered 2019-10-03: 18:00:00 5 mg/h via INTRAVENOUS
  Filled 2019-10-03: qty 100

## 2019-10-07 LAB — CULTURE, BLOOD (ROUTINE X 2)
Culture: NO GROWTH
Culture: NO GROWTH
Special Requests: ADEQUATE

## 2019-10-09 ENCOUNTER — Ambulatory Visit: Payer: Medicaid Other | Admitting: Podiatry

## 2019-10-09 DIAGNOSIS — G931 Anoxic brain damage, not elsewhere classified: Secondary | ICD-10-CM | POA: Diagnosis present

## 2019-10-09 DIAGNOSIS — E876 Hypokalemia: Secondary | ICD-10-CM | POA: Diagnosis present

## 2019-10-09 DIAGNOSIS — J9601 Acute respiratory failure with hypoxia: Secondary | ICD-10-CM | POA: Diagnosis present

## 2019-10-12 ENCOUNTER — Telehealth: Payer: Self-pay

## 2019-10-12 NOTE — Telephone Encounter (Signed)
Received dc from [facility].  DC is for burial and a patient of Doctor Byrum.  DC will be taken to Pulmonary Unit for signature.  On 10/13/2019 Received dc back from Doctor Halford Chessman,   I called the funeral home to let them know Kelly [name] will come by and pick it up tomorrow am.

## 2019-10-27 NOTE — Progress Notes (Signed)
Continued attempts to reach legal guardian in ref to goals of care.

## 2019-10-27 NOTE — Progress Notes (Signed)
Legal guardian Kelly [name] notified that pt expired.

## 2019-10-27 NOTE — Progress Notes (Signed)
Dr. Oletta Darter notified that patient expired.

## 2019-10-27 NOTE — Progress Notes (Signed)
PCCM Interval Note  I spoke with the patient's guardian [name] this afternoon. Explained the EEG and MRI Brain results to her. Discussed patient's poor prognosis for any neurological recovery.   Based on her exam and this data, Tedra Coupe has indicated that we should transition pt to comfort care, should plan for a compassionate extubation. When patient expires she would be released to the morgue as an indigent because there is no family.   I informed Tedra Coupe that she will be contacted by [employer]. I do not know whether she has authority to allow donation eval to proceed. She told me that those decisions are left to family (from whom the patient is estranged - are unavailable). I have not placed withdrawal of care orders yet, waiting until the donation issue is resolved. Once we know one way or the other, then withdrawal can proceed.  Tedra Coupe will need the patient's hospital records forwarded to her once the patient expires.   Baltazar Apo, MD, PhD 12-Oct-2019, 4:43 PM [facility] [phone] or if no answer [phone]

## 2019-10-27 NOTE — Progress Notes (Signed)
25 ml morphine wasted in sink with Vivia Ewing, RN.

## 2019-10-27 NOTE — Progress Notes (Signed)
PCCM:  Multiple attempts throughout the morning to reach patients legal guardian at the numbers provided in the chart.   Garner Nash, DO [place] [facility] 2019/10/08 10:35 AM

## 2019-10-27 NOTE — Death Summary Note (Signed)
DEATH SUMMARY   Patient Details  Name: Kelly [name] MRN: [id number] DOB: [birth year]  Admission/Discharge Information   Admit Date:  10/20/2019  Date of Death: Date of Death: 2019-10-22  Time of Death: Time of Death: 2106-03-08  Length of Stay: 3  Referring Physician: Lorelee Market, MD   Reason(s) for Hospitalization  Cardiopulmonary arrest  Diagnoses  Preliminary cause of death:   Anoxic encephalopathy following cardiac arrest Secondary Diagnoses (including complications and co-morbidities):  Principal Problem:   Acute anoxic encephalopathy ([place]) Active Problems:   Diabetes ([place])   Essential hypertension   ESRD (end stage renal disease) on dialysis ([place])   Adjustment disorder with mixed disturbance of emotions and conduct   Cardiac arrest ([place])   Septic shock ([place])   Pressure injury of skin   Hypokalemia   Hypocalcemia   Acute respiratory failure with hypoxia [facility])   [place] Hospital Course (including significant findings, care, treatment, and services provided and events leading to death)  Kelly [name] is a 38 y.o. year old female with a history of cognitive impairment, end-stage renal disease on hemodialysis, diabetes, hypertension, alcohol abuse.  She she was found slumped over in her wheelchair at her group home on 10-20-2019.  EMS initiated CPR for approximately 10 minutes before return of circulation.  She again lost pulses in the emergency department, underwent another 10 minutes of CPR.  She was intubated mechanically ventilated.  She remained in shock post resuscitation.  This was suspected to be septic shock with a possible superimposed myocardial stunning and cardiogenic component.  This was treated with norepinephrine and she was started on empiric antibiotics, septic source not entirely clear.  Cultures were obtained and were negative.  Her downtime was unclear as it was uncertain when she was last seen normal.  TTM was therefore deferred.  She was found to be  hypocalcemic, hypokalemic, hypomagnesemic and all of these were replaced.  No other dysrhythmias were seen post stabilization.  On neurological examination she was unfortunately noted to have evidence for severe brain injury.  MRI brain confirmed evidence for significant hypoxic encephalopathy.  EEG did not show any seizure activity but also showed evidence for anoxic injury.  Based on these observations discussion of undertaken with the patient's legal guardian [name].  It was felt that in absence of a realistic chance for neurological recovery that the patient would not want to be or benefit from prolonged ICU care or mechanical ventilation.  Based on this patient's CODE STATUS was changed to DNR and she was subsequently transition to comfort care.  She was extubated 2023/10/22 and expired on the same day.    Pertinent Labs and Studies  Significant Diagnostic Studies Ct Head Wo Contrast  Result Date: 20-Oct-2019 CLINICAL DATA:  Altered level of consciousness (LOC), unexplained. Additional history provided: Unwitnessed cardiac arrest. EXAM: CT HEAD WITHOUT CONTRAST TECHNIQUE: Contiguous axial images were obtained from the base of the skull through the vertex without intravenous contrast. COMPARISON:  Brain MRI 09/10/2019, head CT 06/23/2019 FINDINGS: Brain: No evidence of acute intracranial hemorrhage. No demarcated cortical infarction. No evidence of intracranial mass. No midline shift or extra-axial fluid collection. As before, there is generalized parenchymal atrophy which is markedly advanced for age. Redemonstrated significant symmetric volume loss within the caudate heads. Chronic infarct within the lateral left cerebellum. Vascular: No hyperdense vessel. Atherosclerotic calcification of the carotid artery siphons. Skull: Normal. Negative for fracture or focal lesion. Sinuses/Orbits: Visualized orbits demonstrate no acute abnormality. Partial opacification of bilateral ethmoid air  cells and of the  nasal passages. Small right maxillary sinus air-fluid level. Partially visualized support tubes. IMPRESSION: No evidence of acute intracranial abnormality. Please note MRI would have greater sensitivity for hypoxic/ischemic injury. Redemonstrated generalized parenchymal atrophy which is markedly advanced for age. Also unchanged, there is significant symmetric volume loss within the caudate heads. Findings may be due to a combination of toxic/metabolic and ischemic factors given the patient's known history of alcohol abuse, diabetes mellitus and hypertension. Redemonstrated chronic left cerebellar infarct. Electronically Signed   By: Kellie Simmering DO   On: 10/21/2019 19:19   Mr Brain Wo Contrast  Result Date: 10/02/2019 CLINICAL DATA:  Hypoxic ischemic encephalopathy EXAM: MRI HEAD WITHOUT CONTRAST TECHNIQUE: Multiplanar, multiecho pulse sequences of the brain and surrounding structures were obtained without intravenous contrast. COMPARISON:  September 10, 2019 FINDINGS: Brain: There is suspected increased diffusion hyperintensity of the cerebellum and hippocampi. Stable prominence of the ventricles and sulci reflecting parenchymal volume loss much greater than expected for age. Significant caudate head volume loss is again noted. Chronic left cerebellar infarct again seen. There is no intracranial hemorrhage. No significant mass effect or extra-axial fluid collection. Vascular: Major vessel flow voids at the skull base are preserved. Skull and upper cervical spine: Marrow signal is unremarkable. Sinuses/Orbits: Nonspecific diffuse paranasal sinus mucosal thickening. Orbits are unremarkable. Other: Patchy mastoid fluid opacification is nonspecific. IMPRESSION: Suspected abnormal diffusion signal involving the cerebellum and hippocampi, which would be compatible with hypoxic ischemic encephalopathy. Otherwise stable chronic findings detailed above Electronically Signed   By: Macy Mis M.D.   On: 10/02/2019 13:40    Mr Brain Wo Contrast  Result Date: 09/10/2019 CLINICAL DATA:  Ataxia, slowly progressive or long duration. 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 EXAM: MRI HEAD WITHOUT CONTRAST TECHNIQUE: Multiplanar, multiecho pulse sequences of the brain and surrounding structures were obtained without intravenous contrast. COMPARISON:  Head CT 05/24/2019 FINDINGS: Brain: There is no evidence of acute infarct. No evidence of intracranial mass. No midline shift or extra-axial fluid collection. No definite chronic intracranial blood products. Basal ganglia mineralization. Redemonstrated severely age-advanced generalized parenchymal atrophy. There is significant volume loss of the bilateral caudate heads bilaterally (series 9, image 14). Redemonstrated chronic infarct within the left cerebellum. Vascular: Flow voids maintained within the proximal large arterial vessels. Skull and upper cervical spine: No focal marrow lesion within the calvarium. Nonspecific T1 hypointensity within the dens. Sinuses/Orbits: Visualized orbits demonstrate no acute abnormality. No significant paranasal sinus disease. Trace fluid within left mastoid air cells. IMPRESSION: 1. No evidence of acute intracranial abnormality. 2. Redemonstrated severely age-advanced generalized parenchymal atrophy. There is also significant symmetric volume loss within the caudate heads. Findings may be due to a combination of toxic/metabolic and ischemic factors given the patient's known history of alcohol abuse, diabetes mellitus and hypertension. 3. Redemonstrated chronic left cerebellar infarct. Electronically Signed   By: Kellie Simmering   On: 09/10/2019 17:33   Ir Angiogram Extremity Right  Result Date: 09/21/2019 INDICATION: 38 year old female with a history of right foot gangrene, cli EXAM: ULTRASOUND GUIDED ACCESS RIGHT COMMON FEMORAL ARTERY ANGIOGRAM RIGHT LOWER EXTREMITY BALLOON ANGIOPLASTY OF OCCLUDED ANTERIOR TIBIAL ARTERY  AND POSTERIOR TIBIAL ARTERY ANGIO-SEAL FOR CLOSURE MEDICATIONS: 300 MG P.O. PLAVIX, 650 MG P.O. ASPIRIN, 800 MCG INTRA ARTERIAL NITRO, 4 MG INTRA ARTERIAL TPA, 8,000 IV UNITS HEPARIN, 50 MG IV PROTAMINE SULFATE. ANESTHESIA/SEDATION: Moderate (conscious) sedation was employed during this procedure. A total of Versed 0 mg and Fentanyl 175 mcg was administered intravenously. Moderate  Sedation Time: 0 minutes. The patient's level of consciousness and vital signs were monitored continuously by radiology nursing throughout the procedure under my direct supervision. CONTRAST:  44mL VISIPAQUE IODIXANOL 320 MG/ML IV SOLN, 18mL VISIPAQUE IODIXANOL 320 MG/ML IV SOLN FLUOROSCOPY TIME:  Fluoroscopy Time: 20 minutes 46 seconds (55 mGy). COMPLICATIONS: None PROCEDURE: Informed consent was obtained from the patient following explanation of the procedure, risks, benefits and alternatives. The patient understands, agrees and consents for the procedure. All questions were addressed. A time out was performed prior to the initiation of the procedure. Maximal barrier sterile technique utilized including caps, mask, sterile gowns, sterile gloves, large sterile drape, hand hygiene, and Betadine prep. Ultrasound survey of the right inguinal region was performed with images stored and sent to PACs, confirming patency of the vessel. 1% lidocaine was used for local anesthesia. Stab incision was made with 11 blade scalpel. Blunt dissection was performed under ultrasound guidance. A micropuncture needle was used access the common femoral artery under ultrasound. With excellent arterial blood flow returned, and an .018 micro wire was passed through the needle, observed enter the SFA under fluoroscopy. The needle was removed, and a micropuncture sheath was placed over the wire. The inner dilator and wire were removed, and an 035 Bentson wire was advanced under fluoroscopy into the SFA. The sheath was removed and a standard 5 [place] vascular sheath  was placed. The dilator was removed and the sheath was flushed. Angiogram was performed of the right lower extremity A 6 French 35 cm straight bright tip sheath was then placed into the superficial femoral artery. The introducer was removed and the sheath was flushed. Kumpe the catheter was then navigated on the Bentson wire into the tibioperoneal trunk. 018 command wire was then advanced through the Kumpe the catheter, into the origin of the posterior tibial artery. Kumpe the catheter was removed and a 90 cm crossing catheter was advanced over the wire. The combination of the crossing catheter and the command wire were used to cross the occluded posterior tibial artery, achieving a distal position within the posterior tibial artery at the ankle. We then exchanged for a 150 cm 014 crossing catheter and with the catheter at the ankle, the command wire was removed and a super selective angiogram was performed demonstrating the diseased distal tibial and pedal arterial system. In 014 whisper wire was then used to navigate through the plantar arch and the deep pedal loop into the anterior tibial artery, with a retrograde wire position achieved in the distal anterior tibial artery. The crossing catheter was navigated into the dorsalis pedis and a retrograde approach to the occluded distal anterior tibial artery was performed. At this point we did not achieve crossing of the occluded distal anterior tibial artery. Balloon angioplasty was then performed along the length of the occluded posterior tibial artery, common plantar artery, lateral plantar artery, and the deep pedal loop/arch with a 2 mm x 150 mm balloon angioplasty. After balloon angioplasty crossing catheter and the whisper wire, command wire were used in an attempt to cross the distal AT occlusion retrograde. This failed at this point, encountering a dense occlusion at the distal hardware. We then, leaving the initial wire in place, removed the crossing  catheter and addressed the anterior tibial artery antegrade through the sheath. Command wire and crossing catheter placed in the distal anterior tibial artery. And antegrade approach failed through the occlusion. 2 mm balloon angioplasty was then performed through the posterior tibial artery directed wire, into the  distal anterior tibial artery and to the point of the distal occlusion of the AT. We then tried a new 1.0 regalia wire through the balloon catheter, which ultimately crossed into the distal anterior tibial artery through the occlusion. 2.0 diameter balloon angioplasty was then performed to cross the occluded anterior tibial artery. 2.5 mm balloon angioplasty was performed across the entire distal anterior tibial artery, distal posterior tibial artery, common plantar artery, lateral plantar artery. Final angiogram was performed. All catheters wires were removed. The access site was closed with 6 [place] Angio-Seal device. Palpable pedal pulses were achieved at the conclusion with multiphasic Doppler signal. Patient tolerated the procedure well and remained hemodynamically stable throughout. No complications were encountered and no significant blood loss. FINDINGS: Ultrasound demonstrates patent common femoral artery Initial angiogram of the right lower extremity demonstrates dense medial wall calcifications of the femoropopliteal segment and the tibial arteries. Calcifications of the distal tibial and the pedal vessels is mild-to-moderate. Angiogram demonstrates adequate flow to the knee with patent SFA and popliteal artery and no high-grade stenosis. Adequate caliber anterior tibial artery from the origin to the ankle. There is occlusion of the distal anterior tibial artery at the level of the tibial hardware. There is then collateral filling of the hindfoot. Occlusion of the length of the posterior tibial artery with small caliber common plantar artery, lateral plantar artery, and essentially absent  medial plantar artery. Peroneal artery is diseased, though remains patent proximally. Status post balloon angioplasty of the AT and posterior tibial artery occlusions, there is continuous flow through 2 tibial arteries from the pelvis to the midfoot through the pedal loop with restoration of palpable pulses and multiphasic Doppler signal. It seems as though the occlusion of the distal anterior tibial artery was at the point of the tibial hardware at the tip of the more proximal screw. Incomplete healing of proximal fibular fracture. Incomplete healing of distal tibial fracture. IMPRESSION: Status post ultrasound guided access right common femoral artery for antegrade right lower extremity angiogram and restoration of in-line flow through anterior tibial artery and posterior tibial artery, after standard balloon angioplasty. Status post deployment of Angio-Seal for hemostasis. Signed, Dulcy Fanny. Dellia Nims, RPVI [facility] Radiology Electronically Signed   By: Corrie Mckusick D.O.   On: 09/21/2019 07:40   Ir Tib-pero Art Pta Mod Sed  Result Date: 09/21/2019 INDICATION: 38 year old female with a history of right foot gangrene, cli EXAM: ULTRASOUND GUIDED ACCESS RIGHT COMMON FEMORAL ARTERY ANGIOGRAM RIGHT LOWER EXTREMITY BALLOON ANGIOPLASTY OF OCCLUDED ANTERIOR TIBIAL ARTERY AND POSTERIOR TIBIAL ARTERY ANGIO-SEAL FOR CLOSURE MEDICATIONS: 300 MG P.O. PLAVIX, 650 MG P.O. ASPIRIN, 800 MCG INTRA ARTERIAL NITRO, 4 MG INTRA ARTERIAL TPA, 8,000 IV UNITS HEPARIN, 50 MG IV PROTAMINE SULFATE. ANESTHESIA/SEDATION: Moderate (conscious) sedation was employed during this procedure. A total of Versed 0 mg and Fentanyl 175 mcg was administered intravenously. Moderate Sedation Time: 0 minutes. The patient's level of consciousness and vital signs were monitored continuously by radiology nursing throughout the procedure under my direct supervision. CONTRAST:  23mL VISIPAQUE IODIXANOL 320  MG/ML IV SOLN, 70mL VISIPAQUE IODIXANOL 320 MG/ML IV SOLN FLUOROSCOPY TIME:  Fluoroscopy Time: 20 minutes 46 seconds (55 mGy). COMPLICATIONS: None PROCEDURE: Informed consent was obtained from the patient following explanation of the procedure, risks, benefits and alternatives. The patient understands, agrees and consents for the procedure. All questions were addressed. A time out was performed prior to the initiation of the procedure. Maximal barrier sterile technique utilized including caps, mask,  sterile gowns, sterile gloves, large sterile drape, hand hygiene, and Betadine prep. Ultrasound survey of the right inguinal region was performed with images stored and sent to PACs, confirming patency of the vessel. 1% lidocaine was used for local anesthesia. Stab incision was made with 11 blade scalpel. Blunt dissection was performed under ultrasound guidance. A micropuncture needle was used access the common femoral artery under ultrasound. With excellent arterial blood flow returned, and an .018 micro wire was passed through the needle, observed enter the SFA under fluoroscopy. The needle was removed, and a micropuncture sheath was placed over the wire. The inner dilator and wire were removed, and an 035 Bentson wire was advanced under fluoroscopy into the SFA. The sheath was removed and a standard 5 [place] vascular sheath was placed. The dilator was removed and the sheath was flushed. Angiogram was performed of the right lower extremity A 6 French 35 cm straight bright tip sheath was then placed into the superficial femoral artery. The introducer was removed and the sheath was flushed. Kumpe the catheter was then navigated on the Bentson wire into the tibioperoneal trunk. 018 command wire was then advanced through the Kumpe the catheter, into the origin of the posterior tibial artery. Kumpe the catheter was removed and a 90 cm crossing catheter was advanced over the wire. The combination of the crossing catheter and  the command wire were used to cross the occluded posterior tibial artery, achieving a distal position within the posterior tibial artery at the ankle. We then exchanged for a 150 cm 014 crossing catheter and with the catheter at the ankle, the command wire was removed and a super selective angiogram was performed demonstrating the diseased distal tibial and pedal arterial system. In 014 whisper wire was then used to navigate through the plantar arch and the deep pedal loop into the anterior tibial artery, with a retrograde wire position achieved in the distal anterior tibial artery. The crossing catheter was navigated into the dorsalis pedis and a retrograde approach to the occluded distal anterior tibial artery was performed. At this point we did not achieve crossing of the occluded distal anterior tibial artery. Balloon angioplasty was then performed along the length of the occluded posterior tibial artery, common plantar artery, lateral plantar artery, and the deep pedal loop/arch with a 2 mm x 150 mm balloon angioplasty. After balloon angioplasty crossing catheter and the whisper wire, command wire were used in an attempt to cross the distal AT occlusion retrograde. This failed at this point, encountering a dense occlusion at the distal hardware. We then, leaving the initial wire in place, removed the crossing catheter and addressed the anterior tibial artery antegrade through the sheath. Command wire and crossing catheter placed in the distal anterior tibial artery. And antegrade approach failed through the occlusion. 2 mm balloon angioplasty was then performed through the posterior tibial artery directed wire, into the distal anterior tibial artery and to the point of the distal occlusion of the AT. We then tried a new 1.0 regalia wire through the balloon catheter, which ultimately crossed into the distal anterior tibial artery through the occlusion. 2.0 diameter balloon angioplasty was then performed to cross  the occluded anterior tibial artery. 2.5 mm balloon angioplasty was performed across the entire distal anterior tibial artery, distal posterior tibial artery, common plantar artery, lateral plantar artery. Final angiogram was performed. All catheters wires were removed. The access site was closed with 6 [place] Angio-Seal device. Palpable pedal pulses were achieved at the conclusion with  multiphasic Doppler signal. Patient tolerated the procedure well and remained hemodynamically stable throughout. No complications were encountered and no significant blood loss. FINDINGS: Ultrasound demonstrates patent common femoral artery Initial angiogram of the right lower extremity demonstrates dense medial wall calcifications of the femoropopliteal segment and the tibial arteries. Calcifications of the distal tibial and the pedal vessels is mild-to-moderate. Angiogram demonstrates adequate flow to the knee with patent SFA and popliteal artery and no high-grade stenosis. Adequate caliber anterior tibial artery from the origin to the ankle. There is occlusion of the distal anterior tibial artery at the level of the tibial hardware. There is then collateral filling of the hindfoot. Occlusion of the length of the posterior tibial artery with small caliber common plantar artery, lateral plantar artery, and essentially absent medial plantar artery. Peroneal artery is diseased, though remains patent proximally. Status post balloon angioplasty of the AT and posterior tibial artery occlusions, there is continuous flow through 2 tibial arteries from the pelvis to the midfoot through the pedal loop with restoration of palpable pulses and multiphasic Doppler signal. It seems as though the occlusion of the distal anterior tibial artery was at the point of the tibial hardware at the tip of the more proximal screw. Incomplete healing of proximal fibular fracture. Incomplete healing of distal tibial fracture. IMPRESSION: Status post ultrasound  guided access right common femoral artery for antegrade right lower extremity angiogram and restoration of in-line flow through anterior tibial artery and posterior tibial artery, after standard balloon angioplasty. Status post deployment of Angio-Seal for hemostasis. Signed, Dulcy Fanny. Dellia Nims, RPVI [facility]iology [facility] Radiology Electronically Signed   By: Corrie Mckusick D.O.   On: 09/21/2019 07:40   Ir Tib-pero Art Uni Pta Ea Add Vessel Mod Sed  Result Date: 09/21/2019 INDICATION: 38 year old female with a history of right foot gangrene, cli EXAM: ULTRASOUND GUIDED ACCESS RIGHT COMMON FEMORAL ARTERY ANGIOGRAM RIGHT LOWER EXTREMITY BALLOON ANGIOPLASTY OF OCCLUDED ANTERIOR TIBIAL ARTERY AND POSTERIOR TIBIAL ARTERY ANGIO-SEAL FOR CLOSURE MEDICATIONS: 300 MG P.O. PLAVIX, 650 MG P.O. ASPIRIN, 800 MCG INTRA ARTERIAL NITRO, 4 MG INTRA ARTERIAL TPA, 8,000 IV UNITS HEPARIN, 50 MG IV PROTAMINE SULFATE. ANESTHESIA/SEDATION: Moderate (conscious) sedation was employed during this procedure. A total of Versed 0 mg and Fentanyl 175 mcg was administered intravenously. Moderate Sedation Time: 0 minutes. The patient's level of consciousness and vital signs were monitored continuously by radiology nursing throughout the procedure under my direct supervision. CONTRAST:  86mL VISIPAQUE IODIXANOL 320 MG/ML IV SOLN, 53mL VISIPAQUE IODIXANOL 320 MG/ML IV SOLN FLUOROSCOPY TIME:  Fluoroscopy Time: 20 minutes 46 seconds (55 mGy). COMPLICATIONS: None PROCEDURE: Informed consent was obtained from the patient following explanation of the procedure, risks, benefits and alternatives. The patient understands, agrees and consents for the procedure. All questions were addressed. A time out was performed prior to the initiation of the procedure. Maximal barrier sterile technique utilized including caps, mask, sterile gowns, sterile gloves, large sterile drape, hand hygiene, and Betadine prep. Ultrasound  survey of the right inguinal region was performed with images stored and sent to PACs, confirming patency of the vessel. 1% lidocaine was used for local anesthesia. Stab incision was made with 11 blade scalpel. Blunt dissection was performed under ultrasound guidance. A micropuncture needle was used access the common femoral artery under ultrasound. With excellent arterial blood flow returned, and an .018 micro wire was passed through the needle, observed enter the SFA under fluoroscopy. The needle was removed, and a micropuncture sheath was placed over  the wire. The inner dilator and wire were removed, and an 035 Bentson wire was advanced under fluoroscopy into the SFA. The sheath was removed and a standard 5 [place] vascular sheath was placed. The dilator was removed and the sheath was flushed. Angiogram was performed of the right lower extremity A 6 French 35 cm straight bright tip sheath was then placed into the superficial femoral artery. The introducer was removed and the sheath was flushed. Kumpe the catheter was then navigated on the Bentson wire into the tibioperoneal trunk. 018 command wire was then advanced through the Kumpe the catheter, into the origin of the posterior tibial artery. Kumpe the catheter was removed and a 90 cm crossing catheter was advanced over the wire. The combination of the crossing catheter and the command wire were used to cross the occluded posterior tibial artery, achieving a distal position within the posterior tibial artery at the ankle. We then exchanged for a 150 cm 014 crossing catheter and with the catheter at the ankle, the command wire was removed and a super selective angiogram was performed demonstrating the diseased distal tibial and pedal arterial system. In 014 whisper wire was then used to navigate through the plantar arch and the deep pedal loop into the anterior tibial artery, with a retrograde wire position achieved in the distal anterior tibial artery. The  crossing catheter was navigated into the dorsalis pedis and a retrograde approach to the occluded distal anterior tibial artery was performed. At this point we did not achieve crossing of the occluded distal anterior tibial artery. Balloon angioplasty was then performed along the length of the occluded posterior tibial artery, common plantar artery, lateral plantar artery, and the deep pedal loop/arch with a 2 mm x 150 mm balloon angioplasty. After balloon angioplasty crossing catheter and the whisper wire, command wire were used in an attempt to cross the distal AT occlusion retrograde. This failed at this point, encountering a dense occlusion at the distal hardware. We then, leaving the initial wire in place, removed the crossing catheter and addressed the anterior tibial artery antegrade through the sheath. Command wire and crossing catheter placed in the distal anterior tibial artery. And antegrade approach failed through the occlusion. 2 mm balloon angioplasty was then performed through the posterior tibial artery directed wire, into the distal anterior tibial artery and to the point of the distal occlusion of the AT. We then tried a new 1.0 regalia wire through the balloon catheter, which ultimately crossed into the distal anterior tibial artery through the occlusion. 2.0 diameter balloon angioplasty was then performed to cross the occluded anterior tibial artery. 2.5 mm balloon angioplasty was performed across the entire distal anterior tibial artery, distal posterior tibial artery, common plantar artery, lateral plantar artery. Final angiogram was performed. All catheters wires were removed. The access site was closed with 6 [place] Angio-Seal device. Palpable pedal pulses were achieved at the conclusion with multiphasic Doppler signal. Patient tolerated the procedure well and remained hemodynamically stable throughout. No complications were encountered and no significant blood loss. FINDINGS: Ultrasound  demonstrates patent common femoral artery Initial angiogram of the right lower extremity demonstrates dense medial wall calcifications of the femoropopliteal segment and the tibial arteries. Calcifications of the distal tibial and the pedal vessels is mild-to-moderate. Angiogram demonstrates adequate flow to the knee with patent SFA and popliteal artery and no high-grade stenosis. Adequate caliber anterior tibial artery from the origin to the ankle. There is occlusion of the distal anterior tibial artery at the level  of the tibial hardware. There is then collateral filling of the hindfoot. Occlusion of the length of the posterior tibial artery with small caliber common plantar artery, lateral plantar artery, and essentially absent medial plantar artery. Peroneal artery is diseased, though remains patent proximally. Status post balloon angioplasty of the AT and posterior tibial artery occlusions, there is continuous flow through 2 tibial arteries from the pelvis to the midfoot through the pedal loop with restoration of palpable pulses and multiphasic Doppler signal. It seems as though the occlusion of the distal anterior tibial artery was at the point of the tibial hardware at the tip of the more proximal screw. Incomplete healing of proximal fibular fracture. Incomplete healing of distal tibial fracture. IMPRESSION: Status post ultrasound guided access right common femoral artery for antegrade right lower extremity angiogram and restoration of in-line flow through anterior tibial artery and posterior tibial artery, after standard balloon angioplasty. Status post deployment of Angio-Seal for hemostasis. Signed, Dulcy Fanny. Dellia Nims, RPVI [facility] Inter[facility] Rad[facility] Radiology Electronically Signed   By: Corrie Mckusick D.O.   On: 09/21/2019 07:40   Ir US Guide Vasc Access Right  Result Date: 09/21/2019 INDICATION: 38 year old female with a history of right foot gangrene, cli EXAM:  ULTRASOUND GUIDED ACCESS RIGHT COMMON FEMORAL ARTERY ANGIOGRAM RIGHT LOWER EXTREMITY BALLOON ANGIOPLASTY OF OCCLUDED ANTERIOR TIBIAL ARTERY AND POSTERIOR TIBIAL ARTERY ANGIO-SEAL FOR CLOSURE MEDICATIONS: 300 MG P.O. PLAVIX, 650 MG P.O. ASPIRIN, 800 MCG INTRA ARTERIAL NITRO, 4 MG INTRA ARTERIAL TPA, 8,000 IV UNITS HEPARIN, 50 MG IV PROTAMINE SULFATE. ANESTHESIA/SEDATION: Moderate (conscious) sedation was employed during this procedure. A total of Versed 0 mg and Fentanyl 175 mcg was administered intravenously. Moderate Sedation Time: 0 minutes. The patient's level of consciousness and vital signs were monitored continuously by radiology nursing throughout the procedure under my direct supervision. CONTRAST:  29mL VISIPAQUE IODIXANOL 320 MG/ML IV SOLN, 63mL VISIPAQUE IODIXANOL 320 MG/ML IV SOLN FLUOROSCOPY TIME:  Fluoroscopy Time: 20 minutes 46 seconds (55 mGy). COMPLICATIONS: None PROCEDURE: Informed consent was obtained from the patient following explanation of the procedure, risks, benefits and alternatives. The patient understands, agrees and consents for the procedure. All questions were addressed. A time out was performed prior to the initiation of the procedure. Maximal barrier sterile technique utilized including caps, mask, sterile gowns, sterile gloves, large sterile drape, hand hygiene, and Betadine prep. Ultrasound survey of the right inguinal region was performed with images stored and sent to PACs, confirming patency of the vessel. 1% lidocaine was used for local anesthesia. Stab incision was made with 11 blade scalpel. Blunt dissection was performed under ultrasound guidance. A micropuncture needle was used access the common femoral artery under ultrasound. With excellent arterial blood flow returned, and an .018 micro wire was passed through the needle, observed enter the SFA under fluoroscopy. The needle was removed, and a micropuncture sheath was placed over the wire. The inner dilator and wire were  removed, and an 035 Bentson wire was advanced under fluoroscopy into the SFA. The sheath was removed and a standard 5 [place] vascular sheath was placed. The dilator was removed and the sheath was flushed. Angiogram was performed of the right lower extremity A 6 French 35 cm straight bright tip sheath was then placed into the superficial femoral artery. The introducer was removed and the sheath was flushed. Kumpe the catheter was then navigated on the Bentson wire into the tibioperoneal trunk. 018 command wire was then advanced through the Kumpe the catheter, into the  origin of the posterior tibial artery. Kumpe the catheter was removed and a 90 cm crossing catheter was advanced over the wire. The combination of the crossing catheter and the command wire were used to cross the occluded posterior tibial artery, achieving a distal position within the posterior tibial artery at the ankle. We then exchanged for a 150 cm 014 crossing catheter and with the catheter at the ankle, the command wire was removed and a super selective angiogram was performed demonstrating the diseased distal tibial and pedal arterial system. In 014 whisper wire was then used to navigate through the plantar arch and the deep pedal loop into the anterior tibial artery, with a retrograde wire position achieved in the distal anterior tibial artery. The crossing catheter was navigated into the dorsalis pedis and a retrograde approach to the occluded distal anterior tibial artery was performed. At this point we did not achieve crossing of the occluded distal anterior tibial artery. Balloon angioplasty was then performed along the length of the occluded posterior tibial artery, common plantar artery, lateral plantar artery, and the deep pedal loop/arch with a 2 mm x 150 mm balloon angioplasty. After balloon angioplasty crossing catheter and the whisper wire, command wire were used in an attempt to cross the distal AT occlusion retrograde. This failed  at this point, encountering a dense occlusion at the distal hardware. We then, leaving the initial wire in place, removed the crossing catheter and addressed the anterior tibial artery antegrade through the sheath. Command wire and crossing catheter placed in the distal anterior tibial artery. And antegrade approach failed through the occlusion. 2 mm balloon angioplasty was then performed through the posterior tibial artery directed wire, into the distal anterior tibial artery and to the point of the distal occlusion of the AT. We then tried a new 1.0 regalia wire through the balloon catheter, which ultimately crossed into the distal anterior tibial artery through the occlusion. 2.0 diameter balloon angioplasty was then performed to cross the occluded anterior tibial artery. 2.5 mm balloon angioplasty was performed across the entire distal anterior tibial artery, distal posterior tibial artery, common plantar artery, lateral plantar artery. Final angiogram was performed. All catheters wires were removed. The access site was closed with 6 [place] Angio-Seal device. Palpable pedal pulses were achieved at the conclusion with multiphasic Doppler signal. Patient tolerated the procedure well and remained hemodynamically stable throughout. No complications were encountered and no significant blood loss. FINDINGS: Ultrasound demonstrates patent common femoral artery Initial angiogram of the right lower extremity demonstrates dense medial wall calcifications of the femoropopliteal segment and the tibial arteries. Calcifications of the distal tibial and the pedal vessels is mild-to-moderate. Angiogram demonstrates adequate flow to the knee with patent SFA and popliteal artery and no high-grade stenosis. Adequate caliber anterior tibial artery from the origin to the ankle. There is occlusion of the distal anterior tibial artery at the level of the tibial hardware. There is then collateral filling of the hindfoot. Occlusion of  the length of the posterior tibial artery with small caliber common plantar artery, lateral plantar artery, and essentially absent medial plantar artery. Peroneal artery is diseased, though remains patent proximally. Status post balloon angioplasty of the AT and posterior tibial artery occlusions, there is continuous flow through 2 tibial arteries from the pelvis to the midfoot through the pedal loop with restoration of palpable pulses and multiphasic Doppler signal. It seems as though the occlusion of the distal anterior tibial artery was at the point of the tibial hardware at  the tip of the more proximal screw. Incomplete healing of proximal fibular fracture. Incomplete healing of distal tibial fracture. IMPRESSION: Status post ultrasound guided access right common femoral artery for antegrade right lower extremity angiogram and restoration of in-line flow through anterior tibial artery and posterior tibial artery, after standard balloon angioplasty. Status post deployment of Angio-Seal for hemostasis. Signed, Dulcy Fanny. Dellia Nims, RPVI Vascular and Interventional Radiology Specialists [facility] Radiology Electronically Signed   By: Corrie Mckusick D.O.   On: 09/21/2019 07:40   Dg Chest Port 1 View  Result Date: 10/02/2019 CLINICAL DATA:  Respiratory failure EXAM: PORTABLE CHEST 1 VIEW COMPARISON:  October 01, 2019 FINDINGS: There is a left-sided hero graft in place that is stable in positioning. The endotracheal tube terminates above the carina by approximately 3.2 cm. The enteric tube extends below the left hemidiaphragm. The heart size is stable from prior study. There are diffuse hazy bilateral airspace opacities with prominent interstitial lung markings. Bibasilar atelectasis is noted. There is no pneumothorax. The left hemithorax is somewhat obscured by an overlying defibrillator pad. IMPRESSION: 1. Lines and tubes as above. 2. Findings suspicious for developing pulmonary edema. 3. Low lung volumes with  bibasilar atelectasis. Electronically Signed   By: Constance Holster M.D.   On: 10/02/2019 06:24   Dg Chest Portable 1 View  Result Date: 10/23/2019 CLINICAL DATA:  ETT EXAM: PORTABLE CHEST 1 VIEW COMPARISON:  05/24/2019 FINDINGS: Endotracheal tube tip is about 17 mm superior to the carina. Left-sided central venous catheter tip over the proximal right atrium. Low lung volumes. No focal opacity, or pleural effusion. Left axillary vascular stent. Normal heart size. No pneumothorax. Esophageal tube tip in the left upper quadrant of the abdomen IMPRESSION: 1. Endotracheal tube tip about 17 mm superior to the carina 2. Low lung volumes Electronically Signed   By: Donavan Foil M.D.   On: 10/02/2019 17:59   Dg Foot Complete Right  Result Date: 09/11/2019 Please see detailed radiograph report in office note.   Microbiology Recent Results (from the past 240 hour(s))  SARS Coronavirus 2 by RT PCR (hospital order, performed in [facility] hospital lab) Nasopharyngeal Nasopharyngeal Swab     Status: None   Collection Time: 10/08/2019  5:22 PM   Specimen: Nasopharyngeal Swab  Result Value Ref Range Status   SARS Coronavirus 2 NEGATIVE NEGATIVE Final    Comment: (NOTE) If result is NEGATIVE SARS-CoV-2 target nucleic acids are NOT DETECTED. The SARS-CoV-2 RNA is generally detectable in upper and lower  respiratory specimens during the acute phase of infection. The lowest  concentration of SARS-CoV-2 viral copies this assay can detect is 250  copies / mL. A negative result does not preclude SARS-CoV-2 infection  and should not be used as the sole basis for treatment or other  patient management decisions.  A negative result may occur with  improper specimen collection / handling, submission of specimen other  than nasopharyngeal swab, presence of viral mutation(s) within the  areas targeted by this assay, and inadequate number of viral copies  (<250 copies / mL). A negative result must be combined  with clinical  observations, patient history, and epidemiological information. If result is POSITIVE SARS-CoV-2 target nucleic acids are DETECTED. The SARS-CoV-2 RNA is generally detectable in upper and lower  respiratory specimens dur ing the acute phase of infection.  Positive  results are indicative of active infection with SARS-CoV-2.  Clinical  correlation with patient history and other diagnostic information is  necessary to determine patient  infection status.  Positive results do  not rule out bacterial infection or co-infection with other viruses. If result is PRESUMPTIVE POSTIVE SARS-CoV-2 nucleic acids MAY BE PRESENT.   A presumptive positive result was obtained on the submitted specimen  and confirmed on repeat testing.  While 2019 novel coronavirus  (SARS-CoV-2) nucleic acids may be present in the submitted sample  additional confirmatory testing may be necessary for epidemiological  and / or clinical management purposes  to differentiate between  SARS-CoV-2 and other Sarbecovirus currently known to infect humans.  If clinically indicated additional testing with an alternate test  methodology [phone]) is advised. The SARS-CoV-2 RNA is generally  detectable in upper and lower respiratory sp ecimens during the acute  phase of infection. The expected result is Negative. Fact Sheet for Patients:  StrictlyIdeas.no Fact Sheet for Healthcare Providers: BankingDealers.co.za This test is not yet approved or cleared by the [place] FDA and has been authorized for detection and/or diagnosis of SARS-CoV-2 by FDA under an Emergency Use Authorization (EUA).  This EUA will remain in effect (meaning this test can be used) for the duration of the COVID-19 declaration under Section 564(b)(1) of the Act, 21 U.S.C. section 360bbb-3(b)(1), unless the authorization is terminated or revoked sooner. Performed at [facility], [place] [address] [place], [place] [address]   MRSA PCR Screening     Status: None   Collection Time: 10/02/19  4:29 AM   Specimen: Nasal Mucosa; Nasopharyngeal  Result Value Ref Range Status   MRSA by PCR NEGATIVE NEGATIVE Final    Comment:        The GeneXpert MRSA Assay (FDA approved for NASAL specimens only), is one component of a comprehensive MRSA colonization surveillance program. It is not intended to diagnose MRSA infection nor to guide or monitor treatment for MRSA infections. Performed at [facility], [place] [address] [place], [place] [address]   Culture, blood (routine x 2)     Status: None   Collection Time: 10/02/19  9:44 AM   Specimen: BLOOD RIGHT HAND  Result Value Ref Range Status   Specimen Description BLOOD RIGHT HAND  Final   Special Requests   Final    BOTTLES DRAWN AEROBIC ONLY Blood Culture results may not be optimal due to an inadequate volume of blood received in culture bottles   Culture   Final    NO GROWTH 5 DAYS Performed at [facility], [place] [address], [place], [place] [address]    Report Status 10/07/2019 FINAL  Final  Culture, blood (routine x 2)     Status: None   Collection Time: 10/02/19  9:44 AM   Specimen: BLOOD RIGHT HAND  Result Value Ref Range Status   Specimen Description BLOOD RIGHT HAND  Final   Special Requests   Final    BOTTLES DRAWN AEROBIC AND ANAEROBIC Blood Culture adequate volume   Culture   Final    NO GROWTH 5 DAYS Performed at [facility], [place] [address], [place], [place] [address]    Report Status 10/07/2019 FINAL  Final      Baltazar Apo, MD, PhD 10/09/2019, 2:55 PM [facility] [phone] or if no answer [phone]

## 2019-10-27 NOTE — Procedures (Signed)
Patient Name: Kelly [name]  MRN: [id number]  Epilepsy Attending: Lora Havens  Referring Physician/Provider: Dr Baltazar Apo Date: 10/02/2019 Duration: 23.35 mins  Patient history: 38yo F with cardiac arrest. EEG to evaluate for seizure  Level of alertness: comatose  AEDs during EEG study: LTG, VPA  Technical aspects: This EEG study was done with scalp electrodes positioned according to the 10-20 International system of electrode placement. Electrical activity was acquired at a sampling rate of 500Hz  and reviewed with a high frequency filter of 70Hz  and a low frequency filter of 1Hz . EEG data were recorded continuously and digitally stored.   DESCRIPTION: EEG showed continuous generalized background suppression.  EEG was not reactive to tactile/noxious stimuli. Hyperventilation and photic stimulation were not performed.  ABNORMALITY - Background suppression, generalized  IMPRESSION: This study is suggestive of profound encephalopathy, non specific to etiology.  No seizures or epileptiform discharges were seen throughout the recording.

## 2019-10-27 NOTE — Progress Notes (Signed)
Pt's aunt Deneise Lever notified that patient expired. Deneise Lever is on the way, states she will be here in less than an hour.

## 2019-10-27 NOTE — Progress Notes (Addendum)
Patient is comfort care at this time. RRT morgan at the bedside to extubate. Given 2 mg of versed at this time and placed on nasal cannula for comfort

## 2019-10-27 NOTE — Progress Notes (Signed)
NAME:  Kelly [name], MRN:  [id number], DOB:  1981-06-07, LOS: 2 ADMISSION DATE:  10/16/2019, CONSULTATION DATE:  10/10/2019 REFERRING MD:  Dr. Sherril Croon, CHIEF COMPLAINT:  Cardiac arrest    Brief History   38 year old female found down pulseless after cardiac arrest by at local group staff, unknown down time. CPR initiated by EMS with eventual ROSC.   History of present illness   38 yo F with history extensive medical history including cognitive impairment and ESRD presented to the emergency department via EMS after unwitnessed cardiac arrest, patient lives at group home was found by staff slumped over in chair. CPR initiated by EMS with 3 rounds of epi given with ROSC achieved, cardiac rhythm at that time was A-fib RVR rate of 120s to 150s. Shortly after ROSC patient lost pulses again with rhythm showing torsades, she received magnesium and 2 more rounds of epinephrine with again ROSC achieved.  Lab work on admission with mild hypokalemia hyperglycemia, elevated BUN and creatinine consistent with end-stage renal disease, leukocytosis and thrombocytosis.  Chest x-ray with good position of ET tube and volumes.  CT scan head pending.  PCCM consulted for admission.   Past Medical History  ESRD on iHD MWF Cognitively impaired  Hyperparathyroidism Hyperlipidemia Type 2 diabetes Depression Anemia Bipolar disease Alcohol abuse  Significant Hospital Events   11/5 >> admitted after cardiac arrest, intubated  Consults:  PCCM  Procedures:  ETT 11/5 >>  Significant Diagnostic Tests:  CXR 11/5 >> Chest x-ray with good position of ET tube and volumes  Micro Data:  COVID 11/5 >>  Urine culture 11/5 >> Blood cultures 11/5 >> Respiratory culture 11/5 >>  Antimicrobials:  Vanco 11/5 >> Zosyn 11/5 >>   Interim history/subjective:  Patient remains intubated on mechanical life support, critically ill in the intensive care unit  Objective   Blood pressure 116/67, pulse 95, temperature  98.3 F (36.8 C), temperature source Oral, resp. rate (!) 4, height 5\' 2"  (1.575 m), weight 79.9 kg, SpO2 100 %.    Vent Mode: PCV FiO2 (%):  [40 %] 40 % Set Rate:  [16 bmp] 16 bmp PEEP:  [5 cmH20] 5 cmH20 Plateau Pressure:  [17 cmH20-20 cmH20] 20 cmH20   Intake/Output Summary (Last 24 hours) at 23-Oct-2019 0725 Last data filed at Oct 23, 2019 0500 Gross per 24 hour  Intake 908.54 ml  Output -  Net 908.54 ml   Filed Weights   10/11/2019 1712 10/02/19 0437 10/23/2019 0350  Weight: 101 kg 78 kg 79.9 kg    Examination: General: Young female intubated on mechanical life support HEENT: Endotracheal tube in place, pupils reactive to light, no corneals no cough no gag Neuro: Unresponsive, no corneals no cough no gag no breathing over the ventilator when switched into pressure support CV: S1-S2, regular rate and rhythm, no murmurs rubs or gallops PULM: Bilateral ventilated breath sounds GI: Soft, nontender, nondistended, bowel sounds present Extremities: Wound right lower extremity Skin: Skin breakdown right lower extremity   Resolved Hospital Problem list     Assessment & Plan:  Asystolic Cardiac Arrest - unknown downtime.  Unclear etiology at this point.  Possible 2/2 electrolyte derangements given initial K of 3 (3.2 on repeat) and reported torsades after 2nd arrest. P: Concern for significant hypoxic brain injury. Unclear etiology All metabolic derangements to him to be corrected. Continue to observe We will continue goals of care discussions with power of attorney/guardian  Shock - presumed 2/2 above +/- sepsis.  History staph infection AV fistula -Continue  antibiotics Continue norepinephrine maintaining arterial pressure is 65  Acute encephalopathy, concerning for hypoxic injury.   Currently on no sedation -Significant hypoxic brain injury -Confirmed on MRI  ESRD on HD MWF. -Nephrology has been notified. -Awaiting final goals of care discussions with their guardian. -Per  documentation family has agreed to hold any additional dialysis with plans for transition to comfort measures.  Hypocalcemia/hypokalemia -Continue to follow and replete as needed  Hx HTN, HLD, PVD. -Continue aspirin and Plavix  Hx DM. -Sliding scale plus CBGs  Hx depression, cognitive impairment, bipolar disorder, EtOH abuse. -Holding home psychiatric medications.   Best practice:  Diet: NPO, consider initiation of TF within next 24 hrs  Pain/Anxiety/Delirium protocol (if indicated): PRN fentanyl  VAP protocol (if indicated): In place  DVT prophylaxis: SCD GI prophylaxis: PPI Glucose control: SSI Mobility: Bedrest Code Status: Full Family Communication: I will call and reach out to [place] today. Disposition: ICU  Labs   CBC: Recent Labs  Lab 10/16/2019 1740 10/05/2019 1806 09/28/2019 2208 10/02/19 0355 10/02/19 0402  WBC 22.4*  --   --  13.1*  --   HGB 10.0* 10.9* 11.2* 10.8* 10.5*  HCT 37.3 32.0* 33.0* 34.6* 31.0*  MCV 107.2*  --   --  92.5  --   PLT 569*  --   --  443*  --     Basic Metabolic Panel: Recent Labs  Lab 10/02/2019 1710 10/13/2019 1715 10/22/2019 1740 10/23/2019 1806 10/02/2019 2049 10/21/2019 2208 10/02/19 0017 10/02/19 0355 10/02/19 0402  NA 137  --  138 136  --  136 135 137 136  K 3.0*  --  3.1* 3.2*  --  3.1* 2.9* 2.5* 2.5*  CL 98  --  95*  --   --   --  98 100  --   CO2  --   --  17*  --   --   --  21* 21*  --   GLUCOSE 210*  --  219*  --   --   --  215* 197*  --   BUN 19  --  18  --   --   --  23* 24*  --   CREATININE 4.80* 4.50* 5.39*  --   --   --  5.24* 5.24*  --   CALCIUM  --   --  11.0*  --   --   --  10.3 10.9*  --   MG  --   --   --   --  3.3*  --   --  2.7*  --   PHOS  --   --   --   --  5.2*  --   --  2.0*  --    GFR: Estimated Creatinine Clearance: 14.4 mL/min (A) (by C-G formula based on SCr of 5.24 mg/dL (H)). Recent Labs  Lab 10/13/2019 1711 10/21/2019 1740 10/20/2019 2050 10/02/19 0017 10/02/19 0355  WBC  --  22.4*  --   --  13.1*   LATICACIDVEN 10.6*  --  4.3* 2.8*  --     Liver Function Tests: Recent Labs  Lab 10/05/2019 1740  AST 356*  ALT 106*  ALKPHOS 114  BILITOT 0.5  PROT 6.1*  ALBUMIN 1.7*   No results for input(s): LIPASE, AMYLASE in the last 168 hours. No results for input(s): AMMONIA in the last 168 hours.  ABG    Component Value Date/Time   PHART 7.587 (H) 10/02/2019 0402   PCO2ART 25.7 (L) 10/02/2019 0402  PO2ART 59.0 (L) 10/02/2019 0402   HCO3 24.6 10/02/2019 0402   TCO2 25 10/02/2019 0402   ACIDBASEDEF 3.0 (H) 10/17/2019 1806   O2SAT 95.0 10/02/2019 0402     Coagulation Profile: Recent Labs  Lab 09/29/2019 2052  INR 1.3*    Cardiac Enzymes: No results for input(s): CKTOTAL, CKMB, CKMBINDEX, TROPONINI in the last 168 hours.  HbA1C: Hemoglobin A1C  Date/Time Value Ref Range Status  10/07/2012 04:08 AM 5.7 4.2 - 6.3 % Final    Comment:    The American Diabetes Association recommends that a primary goal of therapy should be <7% and that physicians should reevaluate the treatment regimen in patients with HbA1c values consistently >8%.    Hgb A1c MFr Bld  Date/Time Value Ref Range Status  05/25/2019 12:26 AM 6.3 (H) 4.8 - 5.6 % Final    Comment:    (NOTE) Pre diabetes:          5.7%-6.4% Diabetes:              >6.4% Glycemic control for   <7.0% adults with diabetes     CBG: Recent Labs  Lab 10/02/19 1122 10/02/19 1456 10/02/19 1944 10/02/19 2302 Oct 26, 2019 0310  GLUCAP 132* 105* 116* 124* 115*     This patient is critically ill with multiple organ system failure; which, requires frequent high complexity decision making, assessment, support, evaluation, and titration of therapies. This was completed through the application of advanced monitoring technologies and extensive interpretation of multiple databases. During this encounter critical care time was devoted to patient care services described in this note for 32 minutes.  Garner Nash, DO [facility] 10-26-2019 7:38 AM

## 2019-10-27 NOTE — Procedures (Signed)
Extubation Procedure Note  Patient Details:   Name: Kelly [name] DOB: Dec 19, 1980 MRN: [id number]   Airway Documentation:    Vent end date: 11/02/19 Vent end time: 1723   Evaluation  O2 sats: currently acceptable Complications: No apparent complications Patient did tolerate procedure well. Bilateral Breath Sounds: Rhonchi   No   Pt terminally extubated.  Pt placed on 2L [place].  RN at bedside giving meds.  RT will continue to monitor.  Pt is a DNR/DNI.  Pierre Bali 11/02/19, 5:27 PM

## 2019-10-27 NOTE — Progress Notes (Signed)
4 Family members visiting at this time per visitation policy for end of life. No questions from family at this time.

## 2019-10-27 NOTE — Progress Notes (Signed)
Initial Nutrition Assessment   RD working remotely.   DOCUMENTATION CODES:   Not applicable  INTERVENTION:   Recommend initiation of TF if unable to extubate within 24 hours  Tube Feeding:  Vital High Protein at 45 ml/hr Pro-Stat 30 mL daily Provides 1180 kcals, 110 g of protein and 907 mL of free water Meets 100% estimated calorie and protein needs  Add B-complex with C (change to Rena-Vit once able to take po)  NUTRITION DIAGNOSIS:   Inadequate oral intake related to acute illness as evidenced by NPO status.  GOAL:   Patient will meet greater than or equal to 90% of their needs  MONITOR:   Vent status, Skin, TF tolerance, Weight trends, Labs  REASON FOR ASSESSMENT:   Ventilator    ASSESSMENT:   38 yo female admitted after being found unresponsive in group home with cardiac arrest with ROSC after about 17 minutes. PMH includes ESRD on HD, DM, HLD, cognitive impariment and resides in group home.38 yo female admitted after being found unresponsive in group home with cardiac arrest with ROSC after about 17 minutes. PMH includes ESRD on HD, DM, HLD, cognitive impariment and resides in group home.   11/05 Admit post cardiac arrest, intubated, concern for significant brain injury  Pt on vent support, unresponsive. MD trying to contact legal guardian regarding poc  iHD on hold, awaiting further decisions regarding poc  Net +1.7 L. Current wt 79.9 kg, unsure of EDW  Labs: potassium 2.5 (L), phosphorus 2.0 (L), magnesium 2.77 (H), calcium 10.9 (H); CBGs 132-136 (goal 140-180) Meds: ss novolog, thiamine   Diet Order:   Diet Order            Diet NPO time specified  Diet effective now              EDUCATION NEEDS:   Not appropriate for education at this time  Skin:  Skin Assessment: Skin Integrity Issues: Skin Integrity Issues:: DTI, Stage I, Stage II DTI: heel Stage I: ischial tuberosity Stage II: sacrum  Last BM:  11/6  Height:   Ht Readings from  Last 1 Encounters:  10/08/2019 5\' 2"  (1.575 m)    Weight:   Wt Readings from Last 1 Encounters:  Oct 15, 2019 79.9 kg    Ideal Body Weight:   50 kg  BMI:  Body mass index is 32.22 kg/m.  Estimated Nutritional Needs:   Kcal:  1120  Protein:  100-125 g  Fluid:  1000 mL plus UOP    [employer] MS, RDN, LDN, CNSC [phone] Pager  [phone] Weekend/On-Call Pager

## 2019-10-27 NOTE — Progress Notes (Signed)
Pt in asystole, no apical heart tones verified by Vivia Ewing, RN and Delphia Grates, RN. Time of death called at Mar 12, 2106.

## 2019-10-27 DEATH — deceased

## 2019-11-10 ENCOUNTER — Ambulatory Visit (INDEPENDENT_AMBULATORY_CARE_PROVIDER_SITE_OTHER): Payer: Medicaid Other | Admitting: Nurse Practitioner

## 2019-11-10 ENCOUNTER — Encounter (INDEPENDENT_AMBULATORY_CARE_PROVIDER_SITE_OTHER): Payer: Medicaid Other
